# Patient Record
Sex: Female | Born: 1940
Health system: Southern US, Community
[De-identification: ages and names within clinical notes are randomized; demographics above are authoritative.]

## PROBLEM LIST (undated history)

## (undated) DIAGNOSIS — IMO0002 Reserved for concepts with insufficient information to code with codable children: Secondary | ICD-10-CM

## (undated) DIAGNOSIS — M810 Age-related osteoporosis without current pathological fracture: Secondary | ICD-10-CM

## (undated) DIAGNOSIS — G8929 Other chronic pain: Secondary | ICD-10-CM

## (undated) DIAGNOSIS — Z8041 Family history of malignant neoplasm of ovary: Secondary | ICD-10-CM

## (undated) DIAGNOSIS — M549 Dorsalgia, unspecified: Secondary | ICD-10-CM

## (undated) DIAGNOSIS — IMO0001 Reserved for inherently not codable concepts without codable children: Secondary | ICD-10-CM

## (undated) DIAGNOSIS — C50919 Malignant neoplasm of unspecified site of unspecified female breast: Secondary | ICD-10-CM

## (undated) DIAGNOSIS — M1990 Unspecified osteoarthritis, unspecified site: Secondary | ICD-10-CM

## (undated) HISTORY — DX: Family history of malignant neoplasm of ovary: Z80.41

## (undated) HISTORY — DX: Reserved for concepts with insufficient information to code with codable children: IMO0002

## (undated) HISTORY — PX: ABDOMINAL HYSTERECTOMY: SHX81

## (undated) HISTORY — DX: Malignant neoplasm of unspecified site of unspecified female breast: C50.919

## (undated) HISTORY — DX: Reserved for inherently not codable concepts without codable children: IMO0001

## (undated) HISTORY — DX: Age-related osteoporosis without current pathological fracture: M81.0

---

## 1965-05-26 HISTORY — PX: THYROID CYST EXCISION: SHX2511

## 1975-05-27 HISTORY — PX: APPENDECTOMY: SHX54

## 1983-05-27 HISTORY — PX: BREAST SURGERY: SHX581

## 1997-08-10 ENCOUNTER — Ambulatory Visit (HOSPITAL_COMMUNITY): Admission: RE | Admit: 1997-08-10 | Discharge: 1997-08-10 | Payer: Self-pay | Admitting: Internal Medicine

## 1997-10-31 ENCOUNTER — Ambulatory Visit (HOSPITAL_COMMUNITY): Admission: RE | Admit: 1997-10-31 | Discharge: 1997-10-31 | Payer: Self-pay | Admitting: Internal Medicine

## 1998-08-30 ENCOUNTER — Ambulatory Visit (HOSPITAL_COMMUNITY): Admission: RE | Admit: 1998-08-30 | Discharge: 1998-08-30 | Payer: Self-pay | Admitting: Internal Medicine

## 1998-08-30 ENCOUNTER — Encounter: Payer: Self-pay | Admitting: Internal Medicine

## 1999-09-02 ENCOUNTER — Ambulatory Visit (HOSPITAL_COMMUNITY): Admission: RE | Admit: 1999-09-02 | Discharge: 1999-09-02 | Payer: Self-pay | Admitting: Internal Medicine

## 1999-09-02 ENCOUNTER — Encounter: Payer: Self-pay | Admitting: Internal Medicine

## 2000-06-01 ENCOUNTER — Ambulatory Visit (HOSPITAL_COMMUNITY): Admission: RE | Admit: 2000-06-01 | Discharge: 2000-06-01 | Payer: Self-pay | Admitting: Internal Medicine

## 2000-06-01 ENCOUNTER — Encounter: Payer: Self-pay | Admitting: Internal Medicine

## 2000-09-08 ENCOUNTER — Ambulatory Visit (HOSPITAL_COMMUNITY): Admission: RE | Admit: 2000-09-08 | Discharge: 2000-09-08 | Payer: Self-pay | Admitting: Internal Medicine

## 2000-09-08 ENCOUNTER — Encounter: Payer: Self-pay | Admitting: Internal Medicine

## 2001-10-04 ENCOUNTER — Ambulatory Visit (HOSPITAL_COMMUNITY): Admission: RE | Admit: 2001-10-04 | Discharge: 2001-10-04 | Payer: Self-pay | Admitting: Internal Medicine

## 2001-10-04 ENCOUNTER — Encounter: Payer: Self-pay | Admitting: Internal Medicine

## 2002-09-08 ENCOUNTER — Encounter: Payer: Self-pay | Admitting: Internal Medicine

## 2002-09-08 ENCOUNTER — Ambulatory Visit (HOSPITAL_COMMUNITY): Admission: RE | Admit: 2002-09-08 | Discharge: 2002-09-08 | Payer: Self-pay | Admitting: Internal Medicine

## 2002-10-26 ENCOUNTER — Ambulatory Visit (HOSPITAL_COMMUNITY): Admission: RE | Admit: 2002-10-26 | Discharge: 2002-10-26 | Payer: Self-pay | Admitting: Internal Medicine

## 2002-10-26 ENCOUNTER — Encounter: Payer: Self-pay | Admitting: Internal Medicine

## 2003-11-02 ENCOUNTER — Ambulatory Visit (HOSPITAL_COMMUNITY): Admission: RE | Admit: 2003-11-02 | Discharge: 2003-11-02 | Payer: Self-pay | Admitting: Internal Medicine

## 2004-09-30 ENCOUNTER — Ambulatory Visit (HOSPITAL_COMMUNITY): Admission: RE | Admit: 2004-09-30 | Discharge: 2004-09-30 | Payer: Self-pay | Admitting: Internal Medicine

## 2004-10-29 ENCOUNTER — Ambulatory Visit (HOSPITAL_COMMUNITY): Admission: RE | Admit: 2004-10-29 | Discharge: 2004-10-29 | Payer: Self-pay | Admitting: Internal Medicine

## 2005-11-04 ENCOUNTER — Ambulatory Visit (HOSPITAL_COMMUNITY): Admission: RE | Admit: 2005-11-04 | Discharge: 2005-11-04 | Payer: Self-pay | Admitting: Internal Medicine

## 2006-11-18 ENCOUNTER — Ambulatory Visit (HOSPITAL_COMMUNITY): Admission: RE | Admit: 2006-11-18 | Discharge: 2006-11-18 | Payer: Self-pay | Admitting: Internal Medicine

## 2007-12-20 ENCOUNTER — Ambulatory Visit (HOSPITAL_COMMUNITY): Admission: RE | Admit: 2007-12-20 | Discharge: 2007-12-20 | Payer: Self-pay | Admitting: Internal Medicine

## 2007-12-28 ENCOUNTER — Encounter: Admission: RE | Admit: 2007-12-28 | Discharge: 2007-12-28 | Payer: Self-pay | Admitting: Internal Medicine

## 2008-05-04 ENCOUNTER — Ambulatory Visit (HOSPITAL_COMMUNITY): Admission: RE | Admit: 2008-05-04 | Discharge: 2008-05-04 | Payer: Self-pay | Admitting: Internal Medicine

## 2009-01-02 ENCOUNTER — Ambulatory Visit (HOSPITAL_COMMUNITY): Admission: RE | Admit: 2009-01-02 | Discharge: 2009-01-02 | Payer: Self-pay | Admitting: Internal Medicine

## 2010-06-17 ENCOUNTER — Encounter: Payer: Self-pay | Admitting: Internal Medicine

## 2011-07-07 ENCOUNTER — Ambulatory Visit (HOSPITAL_COMMUNITY)
Admission: RE | Admit: 2011-07-07 | Discharge: 2011-07-07 | Disposition: A | Discharge status: Home / Self Care | Payer: Medicare Other | Source: Ambulatory Visit | Attending: Internal Medicine | Admitting: Internal Medicine

## 2011-07-07 ENCOUNTER — Other Ambulatory Visit (HOSPITAL_COMMUNITY): Payer: Self-pay | Admitting: Internal Medicine

## 2011-07-07 DIAGNOSIS — M899 Disorder of bone, unspecified: Secondary | ICD-10-CM | POA: Insufficient documentation

## 2011-07-07 DIAGNOSIS — M545 Low back pain, unspecified: Secondary | ICD-10-CM

## 2011-07-07 DIAGNOSIS — M949 Disorder of cartilage, unspecified: Secondary | ICD-10-CM | POA: Diagnosis not present

## 2011-07-07 DIAGNOSIS — M439 Deforming dorsopathy, unspecified: Secondary | ICD-10-CM | POA: Insufficient documentation

## 2011-07-07 DIAGNOSIS — M47817 Spondylosis without myelopathy or radiculopathy, lumbosacral region: Secondary | ICD-10-CM | POA: Diagnosis not present

## 2011-07-28 ENCOUNTER — Other Ambulatory Visit: Payer: Self-pay | Admitting: Internal Medicine

## 2011-07-28 DIAGNOSIS — M545 Low back pain, unspecified: Secondary | ICD-10-CM

## 2011-07-29 ENCOUNTER — Ambulatory Visit
Admission: RE | Admit: 2011-07-29 | Discharge: 2011-07-29 | Disposition: A | Discharge status: Home / Self Care | Payer: Medicare Other | Source: Ambulatory Visit | Attending: Internal Medicine | Admitting: Internal Medicine

## 2011-07-29 DIAGNOSIS — M545 Low back pain, unspecified: Secondary | ICD-10-CM

## 2011-07-29 DIAGNOSIS — Z853 Personal history of malignant neoplasm of breast: Secondary | ICD-10-CM | POA: Diagnosis not present

## 2011-07-29 DIAGNOSIS — M5126 Other intervertebral disc displacement, lumbar region: Secondary | ICD-10-CM | POA: Diagnosis not present

## 2011-07-29 DIAGNOSIS — M8448XA Pathological fracture, other site, initial encounter for fracture: Secondary | ICD-10-CM | POA: Diagnosis not present

## 2011-08-08 DIAGNOSIS — S32009A Unspecified fracture of unspecified lumbar vertebra, initial encounter for closed fracture: Secondary | ICD-10-CM | POA: Diagnosis not present

## 2011-08-08 DIAGNOSIS — Z79899 Other long term (current) drug therapy: Secondary | ICD-10-CM | POA: Diagnosis not present

## 2011-08-08 DIAGNOSIS — M545 Low back pain, unspecified: Secondary | ICD-10-CM | POA: Diagnosis not present

## 2011-08-08 DIAGNOSIS — E559 Vitamin D deficiency, unspecified: Secondary | ICD-10-CM | POA: Diagnosis not present

## 2011-09-01 DIAGNOSIS — M810 Age-related osteoporosis without current pathological fracture: Secondary | ICD-10-CM | POA: Diagnosis not present

## 2011-09-01 DIAGNOSIS — M545 Low back pain, unspecified: Secondary | ICD-10-CM | POA: Diagnosis not present

## 2011-09-10 DIAGNOSIS — I10 Essential (primary) hypertension: Secondary | ICD-10-CM | POA: Diagnosis not present

## 2011-09-10 DIAGNOSIS — M899 Disorder of bone, unspecified: Secondary | ICD-10-CM | POA: Diagnosis not present

## 2011-09-10 DIAGNOSIS — M949 Disorder of cartilage, unspecified: Secondary | ICD-10-CM | POA: Diagnosis not present

## 2011-09-30 DIAGNOSIS — S32009A Unspecified fracture of unspecified lumbar vertebra, initial encounter for closed fracture: Secondary | ICD-10-CM | POA: Diagnosis not present

## 2011-10-08 ENCOUNTER — Other Ambulatory Visit: Payer: Self-pay | Admitting: Orthopaedic Surgery

## 2011-10-08 DIAGNOSIS — S32009A Unspecified fracture of unspecified lumbar vertebra, initial encounter for closed fracture: Secondary | ICD-10-CM | POA: Diagnosis not present

## 2011-10-08 DIAGNOSIS — S32000A Wedge compression fracture of unspecified lumbar vertebra, initial encounter for closed fracture: Secondary | ICD-10-CM

## 2011-10-09 ENCOUNTER — Ambulatory Visit
Admission: RE | Admit: 2011-10-09 | Discharge: 2011-10-09 | Disposition: A | Discharge status: Home / Self Care | Payer: Medicare Other | Source: Ambulatory Visit | Attending: Orthopaedic Surgery | Admitting: Orthopaedic Surgery

## 2011-10-09 DIAGNOSIS — M5126 Other intervertebral disc displacement, lumbar region: Secondary | ICD-10-CM | POA: Diagnosis not present

## 2011-10-09 DIAGNOSIS — M8448XA Pathological fracture, other site, initial encounter for fracture: Secondary | ICD-10-CM | POA: Diagnosis not present

## 2011-10-09 DIAGNOSIS — S32009A Unspecified fracture of unspecified lumbar vertebra, initial encounter for closed fracture: Secondary | ICD-10-CM | POA: Diagnosis not present

## 2011-10-09 DIAGNOSIS — S32000A Wedge compression fracture of unspecified lumbar vertebra, initial encounter for closed fracture: Secondary | ICD-10-CM

## 2011-10-28 DIAGNOSIS — S32009A Unspecified fracture of unspecified lumbar vertebra, initial encounter for closed fracture: Secondary | ICD-10-CM | POA: Diagnosis not present

## 2011-11-05 DIAGNOSIS — R072 Precordial pain: Secondary | ICD-10-CM | POA: Diagnosis not present

## 2011-11-25 DIAGNOSIS — S32009A Unspecified fracture of unspecified lumbar vertebra, initial encounter for closed fracture: Secondary | ICD-10-CM | POA: Diagnosis not present

## 2011-12-26 DIAGNOSIS — S32009A Unspecified fracture of unspecified lumbar vertebra, initial encounter for closed fracture: Secondary | ICD-10-CM | POA: Diagnosis not present

## 2012-01-27 DIAGNOSIS — R197 Diarrhea, unspecified: Secondary | ICD-10-CM | POA: Diagnosis not present

## 2012-01-27 DIAGNOSIS — R5381 Other malaise: Secondary | ICD-10-CM | POA: Diagnosis not present

## 2012-01-27 DIAGNOSIS — J029 Acute pharyngitis, unspecified: Secondary | ICD-10-CM | POA: Diagnosis not present

## 2012-01-30 DIAGNOSIS — S32009A Unspecified fracture of unspecified lumbar vertebra, initial encounter for closed fracture: Secondary | ICD-10-CM | POA: Diagnosis not present

## 2012-02-04 DIAGNOSIS — R5381 Other malaise: Secondary | ICD-10-CM | POA: Diagnosis not present

## 2012-02-04 DIAGNOSIS — R5383 Other fatigue: Secondary | ICD-10-CM | POA: Diagnosis not present

## 2012-02-04 DIAGNOSIS — R350 Frequency of micturition: Secondary | ICD-10-CM | POA: Diagnosis not present

## 2012-02-09 DIAGNOSIS — R197 Diarrhea, unspecified: Secondary | ICD-10-CM | POA: Diagnosis not present

## 2012-02-09 DIAGNOSIS — B370 Candidal stomatitis: Secondary | ICD-10-CM | POA: Diagnosis not present

## 2012-02-10 DIAGNOSIS — R197 Diarrhea, unspecified: Secondary | ICD-10-CM | POA: Diagnosis not present

## 2012-02-23 DIAGNOSIS — R634 Abnormal weight loss: Secondary | ICD-10-CM | POA: Diagnosis not present

## 2012-06-11 DIAGNOSIS — Z23 Encounter for immunization: Secondary | ICD-10-CM | POA: Diagnosis not present

## 2012-07-01 DIAGNOSIS — Z1212 Encounter for screening for malignant neoplasm of rectum: Secondary | ICD-10-CM | POA: Diagnosis not present

## 2012-07-01 DIAGNOSIS — Z79899 Other long term (current) drug therapy: Secondary | ICD-10-CM | POA: Diagnosis not present

## 2012-07-01 DIAGNOSIS — R7309 Other abnormal glucose: Secondary | ICD-10-CM | POA: Diagnosis not present

## 2012-07-01 DIAGNOSIS — I10 Essential (primary) hypertension: Secondary | ICD-10-CM | POA: Diagnosis not present

## 2012-07-01 DIAGNOSIS — E559 Vitamin D deficiency, unspecified: Secondary | ICD-10-CM | POA: Diagnosis not present

## 2012-07-01 DIAGNOSIS — E782 Mixed hyperlipidemia: Secondary | ICD-10-CM | POA: Diagnosis not present

## 2012-09-07 DIAGNOSIS — N8183 Incompetence or weakening of rectovaginal tissue: Secondary | ICD-10-CM | POA: Diagnosis not present

## 2012-10-04 DIAGNOSIS — R351 Nocturia: Secondary | ICD-10-CM | POA: Diagnosis not present

## 2013-01-06 DIAGNOSIS — I10 Essential (primary) hypertension: Secondary | ICD-10-CM | POA: Diagnosis not present

## 2013-01-06 DIAGNOSIS — E559 Vitamin D deficiency, unspecified: Secondary | ICD-10-CM | POA: Diagnosis not present

## 2013-01-06 DIAGNOSIS — E782 Mixed hyperlipidemia: Secondary | ICD-10-CM | POA: Diagnosis not present

## 2013-01-06 DIAGNOSIS — Z79899 Other long term (current) drug therapy: Secondary | ICD-10-CM | POA: Diagnosis not present

## 2013-02-07 DIAGNOSIS — Z23 Encounter for immunization: Secondary | ICD-10-CM | POA: Diagnosis not present

## 2013-02-07 DIAGNOSIS — J309 Allergic rhinitis, unspecified: Secondary | ICD-10-CM | POA: Diagnosis not present

## 2013-02-07 DIAGNOSIS — J019 Acute sinusitis, unspecified: Secondary | ICD-10-CM | POA: Diagnosis not present

## 2013-07-02 DIAGNOSIS — K589 Irritable bowel syndrome without diarrhea: Secondary | ICD-10-CM | POA: Insufficient documentation

## 2013-07-02 DIAGNOSIS — E785 Hyperlipidemia, unspecified: Secondary | ICD-10-CM | POA: Insufficient documentation

## 2013-07-02 DIAGNOSIS — E559 Vitamin D deficiency, unspecified: Secondary | ICD-10-CM | POA: Insufficient documentation

## 2013-07-02 DIAGNOSIS — R7309 Other abnormal glucose: Secondary | ICD-10-CM | POA: Insufficient documentation

## 2013-07-02 DIAGNOSIS — R0989 Other specified symptoms and signs involving the circulatory and respiratory systems: Secondary | ICD-10-CM | POA: Insufficient documentation

## 2013-07-02 DIAGNOSIS — M810 Age-related osteoporosis without current pathological fracture: Secondary | ICD-10-CM | POA: Insufficient documentation

## 2013-07-07 ENCOUNTER — Ambulatory Visit (INDEPENDENT_AMBULATORY_CARE_PROVIDER_SITE_OTHER): Payer: Medicare Other | Admitting: Internal Medicine

## 2013-07-07 ENCOUNTER — Encounter: Payer: Self-pay | Admitting: Internal Medicine

## 2013-07-07 VITALS — BP 126/80 | HR 88 | Temp 97.9°F | Resp 16 | Ht 62.5 in | Wt 122.2 lb

## 2013-07-07 DIAGNOSIS — E559 Vitamin D deficiency, unspecified: Secondary | ICD-10-CM

## 2013-07-07 DIAGNOSIS — Z79899 Other long term (current) drug therapy: Secondary | ICD-10-CM | POA: Diagnosis not present

## 2013-07-07 DIAGNOSIS — Z1212 Encounter for screening for malignant neoplasm of rectum: Secondary | ICD-10-CM | POA: Diagnosis not present

## 2013-07-07 DIAGNOSIS — Z0000 Encounter for general adult medical examination without abnormal findings: Secondary | ICD-10-CM

## 2013-07-07 DIAGNOSIS — R7309 Other abnormal glucose: Secondary | ICD-10-CM | POA: Diagnosis not present

## 2013-07-07 DIAGNOSIS — I10 Essential (primary) hypertension: Secondary | ICD-10-CM | POA: Diagnosis not present

## 2013-07-07 DIAGNOSIS — Z7189 Other specified counseling: Secondary | ICD-10-CM | POA: Insufficient documentation

## 2013-07-07 DIAGNOSIS — R0989 Other specified symptoms and signs involving the circulatory and respiratory systems: Secondary | ICD-10-CM

## 2013-07-07 DIAGNOSIS — R7303 Prediabetes: Secondary | ICD-10-CM

## 2013-07-07 DIAGNOSIS — E785 Hyperlipidemia, unspecified: Secondary | ICD-10-CM

## 2013-07-07 LAB — CBC WITH DIFFERENTIAL/PLATELET
BASOS ABS: 0 10*3/uL (ref 0.0–0.1)
Basophils Relative: 1 % (ref 0–1)
Eosinophils Absolute: 0.2 10*3/uL (ref 0.0–0.7)
Eosinophils Relative: 4 % (ref 0–5)
HEMATOCRIT: 43 % (ref 36.0–46.0)
HEMOGLOBIN: 14.8 g/dL (ref 12.0–15.0)
LYMPHS ABS: 1.2 10*3/uL (ref 0.7–4.0)
LYMPHS PCT: 22 % (ref 12–46)
MCH: 31.2 pg (ref 26.0–34.0)
MCHC: 34.4 g/dL (ref 30.0–36.0)
MCV: 90.7 fL (ref 78.0–100.0)
MONOS PCT: 8 % (ref 3–12)
Monocytes Absolute: 0.4 10*3/uL (ref 0.1–1.0)
NEUTROS ABS: 3.6 10*3/uL (ref 1.7–7.7)
Neutrophils Relative %: 65 % (ref 43–77)
Platelets: 258 10*3/uL (ref 150–400)
RBC: 4.74 MIL/uL (ref 3.87–5.11)
RDW: 14.4 % (ref 11.5–15.5)
WBC: 5.5 10*3/uL (ref 4.0–10.5)

## 2013-07-07 NOTE — Patient Instructions (Signed)

## 2013-07-07 NOTE — Progress Notes (Signed)
Patient ID: April Rosales, female   DOB: 1940-09-10, 73 y.o.   MRN: [id number]  Annual Screening Comprehensive Examination  This very nice 73 y.o. SWF presents for complete physical.  Patient has been followed for hx/o labile HTN, Prediabetes, Hyperlipidemia, and Vitamin D Deficiency.    Patient has remote hx/o a elevated BP in 2009 and has been monitored expectantly. Patient's BP has been controlled at home. Today's BP: 126/80 mmHg. Patient denies any cardiac symptoms as chest pain, palpitations, shortness of breath, dizziness or ankle swelling.   Patient's hyperlipidemia is controlled with diet. Patient denies myalgias or other medication SE's. Last cholesterol last visit was 187, triglycerides 93, HDL 56 and slightly elevated LDL 112 in Aug 2014.     Patient has prediabetes/insulin resistance 5.4%/32 in Oct 2012 and with last A1c 5.5% in Feb 2014. Patient denies reactive hypoglycemic symptoms, visual blurring, diabetic polys, or paresthesias.    Patient has remote Hx/o Rt Breast cancer (1985) removed by lumpectomy and had radiation therapy at [facility] in [place](but no chemotx). She declines to have a MGM stating she would not have any treatments of any kind whatsoever.   Finally, patient has history of Vitamin D Deficiency 28 in 2008  with last vitamin D 29 in Feb 2014.       Medication List         aspirin 81 MG tablet  Take 81 mg by mouth daily.     GERITOL PO  Take by mouth daily.     methocarbamol 500 MG tablet  Commonly known as:  ROBAXIN     TUMS 500 MG chewable tablet  Generic drug:  calcium carbonate  Chew 1 tablet by mouth as needed for indigestion or heartburn.     VITAMIN B-12 PO  Take by mouth daily. Takes qod     VITAMIN C PO  Take by mouth daily. Takes qod     VITAMIN D PO  Take 2,000 Int'l Units by mouth daily.        No Known Allergies  Past Medical History  Diagnosis Date  . Labile hypertension   . Hyperlipidemia   . Prediabetes   . Vitamin D  deficiency   . Osteoporosis   . IBS (irritable bowel syndrome)   . History of breast cancer     Past Surgical History  Procedure Laterality Date  . Appendectomy  1977  . Abdominal hysterectomy      1980  . Thyroid cyst excision  1967  . Breast surgery Right 1985    Family History  Problem Relation Age of Onset  . Heart disease Mother   . Hypertension Mother   . Heart disease Father   . Diabetes Father     History  Substance Use Topics  . Smoking status: Former Research scientist (life sciences)  . Smokeless tobacco: Former Systems developer    Quit date: 07/08/1963  . Alcohol Use: Yes     Comment: Rare    ROS Constitutional: Denies fever, chills, weight loss/gain, headaches, insomnia, fatigue, night sweats, and change in appetite. Eyes: Denies redness, blurred vision, diplopia, discharge, itchy, watery eyes.  ENT: Denies discharge, congestion, post nasal drip, epistaxis, sore throat, earache, hearing loss, dental pain, Tinnitus, Vertigo, Sinus pain, snoring.  Cardio: Denies chest pain, palpitations, irregular heartbeat, syncope, dyspnea, diaphoresis, orthopnea, PND, claudication, edema Respiratory: denies cough, dyspnea, DOE, pleurisy, hoarseness, laryngitis, wheezing.  Gastrointestinal: Denies dysphagia, heartburn, reflux, water brash, pain, cramps, nausea, vomiting, bloating, diarrhea, constipation, hematemesis, melena, hematochezia, jaundice, hemorrhoids Genitourinary: Denies dysuria,  frequency, urgency, nocturia, hesitancy, discharge, hematuria, flank pain Breast:Breast lumps, nipple discharge, bleeding.  Musculoskeletal: Denies arthralgia, myalgia, stiffness, Jt. Swelling, pain, limp, and strain/sprain. Skin: Denies puritis, rash, hives, warts, acne, eczema, changing in skin lesion Neuro: No weakness, tremor, incoordination, spasms, paresthesia, pain Psychiatric: Denies confusion, memory loss, sensory loss Endocrine: Denies change in weight, skin, hair change, nocturia, and paresthesia, diabetic polys,  visual blurring, hyper / hypo glycemic episodes.  Heme/Lymph: No excessive bleeding, bruising, enlarged lymph nodes.  BP: 126/80  Pulse: 88  Temp: 97.9 F (36.6 C)  Resp: 16    Estimated body mass index is 21.98 kg/(m^2) as calculated from the following:   Height as of this encounter: 5' 2.5" (1.588 m).   Weight as of this encounter: 122 lb 3.2 oz (55.43 kg).  Physical Exam General Appearance: Well nourished, in no apparent distress. Eyes: PERRLA, EOMs, conjunctiva no swelling or erythema, normal fundi and vessels. Sinuses: No frontal/maxillary tenderness ENT/Mouth: EACs patent / TMs  nl. Nares clear without erythema, swelling, mucoid exudates. Oral hygiene is good. No erythema, swelling, or exudate. Tongue normal, non-obstructing. Tonsils not swollen or erythematous. Hearing normal.  Neck: Supple, thyroid normal. No bruits, nodes or JVD. Respiratory: Respiratory effort normal.  BS equal and clear bilateral without rales, rhonci, wheezing or stridor. Cardio: Heart sounds are normal with regular rate and rhythm and no murmurs, rubs or gallops. Peripheral pulses are normal and equal bilaterally without edema. No aortic or femoral bruits. Chest: symmetric with normal excursions and percussion. Mild kyphoscoliosis. Breasts: Symmetric, without lumps, nipple discharge, retractions, or fibrocystic changes.  Abdomen: Flat, soft, with bowl sounds. Nontender, no guarding, rebound, hernias, masses, or organomegaly.  Lymphatics: Non tender without lymphadenopathy.  Genitourinary:  Musculoskeletal: Full ROM all peripheral extremities, joint stability, 5/5 strength, and normal gait. Skin: Warm and dry without rashes, lesions, cyanosis, clubbing or  ecchymosis.  Neuro: Cranial nerves intact, reflexes equal bilaterally. Normal muscle tone, no cerebellar symptoms. Sensation intact.  Pysch: Awake and oriented X 3, normal affect, Insight and Judgment appropriate.   Assessment and Plan  1. Annual  Screening Examination 2. Hypertension, Labile - monitoring expectantly 3. Hyperlipidemia, diet controlled 4. Pre Diabetes, screening - monitoring expectantly 5. Vitamin D Deficiency - supplementing 6. Breast Cancer (1985)  Continue prudent diet as discussed, weight control, BP monitoring, regular exercise, and medications. Discussed med's effects and SE's. Screening labs and tests as requested with regular follow-up as recommended. Patient was advised annual MGM and she informed me and the nurse Ms. Evans that she will not have a MGM , because she would not do nything if ahe were found to have another breast cancer.

## 2013-07-08 LAB — URINALYSIS, MICROSCOPIC ONLY
BACTERIA UA: NONE SEEN
CASTS: NONE SEEN
CRYSTALS: NONE SEEN
Squamous Epithelial / LPF: NONE SEEN

## 2013-07-08 LAB — BASIC METABOLIC PANEL WITH GFR
BUN: 16 mg/dL (ref 6–23)
CALCIUM: 10.1 mg/dL (ref 8.4–10.5)
CHLORIDE: 103 meq/L (ref 96–112)
CO2: 28 mEq/L (ref 19–32)
Creat: 0.57 mg/dL (ref 0.50–1.10)
GFR, Est Non African American: >89 mL/min
GLUCOSE: 87 mg/dL (ref 70–99)
POTASSIUM: 5.3 meq/L (ref 3.5–5.3)
SODIUM: 140 meq/L (ref 135–145)

## 2013-07-08 LAB — HEPATIC FUNCTION PANEL
ALBUMIN: 4 g/dL (ref 3.5–5.2)
ALT: 15 U/L (ref 0–35)
AST: 19 U/L (ref 0–37)
Alkaline Phosphatase: 70 U/L (ref 39–117)
Bilirubin, Direct: 0.1 mg/dL (ref 0.0–0.3)
Indirect Bilirubin: 0.3 mg/dL (ref 0.2–1.2)
Total Bilirubin: 0.4 mg/dL (ref 0.2–1.2)
Total Protein: 6.9 g/dL (ref 6.0–8.3)

## 2013-07-08 LAB — MICROALBUMIN / CREATININE URINE RATIO
Creatinine, Urine: 37.3 mg/dL
MICROALB/CREAT RATIO: 13.4 mg/g (ref 0.0–30.0)
Microalb, Ur: 0.5 mg/dL (ref 0.00–1.89)

## 2013-07-08 LAB — INSULIN, FASTING: Insulin fasting, serum: 10 u[IU]/mL (ref 3–28)

## 2013-07-08 LAB — LIPID PANEL
CHOL/HDL RATIO: 3.4 ratio
CHOLESTEROL: 169 mg/dL (ref 0–200)
HDL: 49 mg/dL (ref >39)
LDL Cholesterol: 98 mg/dL (ref 0–99)
Triglycerides: 112 mg/dL (ref <150)
VLDL: 22 mg/dL (ref 0–40)

## 2013-07-08 LAB — VITAMIN D 25 HYDROXY (VIT D DEFICIENCY, FRACTURES): Vit D, 25-Hydroxy: 54 ng/mL (ref 30–89)

## 2013-07-08 LAB — TSH: TSH: 1.122 u[IU]/mL (ref 0.350–4.500)

## 2013-07-08 LAB — MAGNESIUM: Magnesium: 1.9 mg/dL (ref 1.5–2.5)

## 2013-07-08 LAB — HEMOGLOBIN A1C
Hgb A1c MFr Bld: 5.5 % (ref <5.7)
Mean Plasma Glucose: 111 mg/dL (ref <117)

## 2014-01-10 ENCOUNTER — Ambulatory Visit: Payer: Medicare Other | Admitting: Internal Medicine

## 2014-06-02 ENCOUNTER — Encounter: Payer: Self-pay | Admitting: Internal Medicine

## 2014-06-02 ENCOUNTER — Ambulatory Visit (INDEPENDENT_AMBULATORY_CARE_PROVIDER_SITE_OTHER): Payer: Medicare Other | Admitting: Internal Medicine

## 2014-06-02 VITALS — BP 122/80 | HR 104 | Temp 99.0°F | Resp 16 | Ht 62.5 in | Wt 120.4 lb

## 2014-06-02 DIAGNOSIS — J0410 Acute tracheitis without obstruction: Secondary | ICD-10-CM

## 2014-06-02 DIAGNOSIS — J029 Acute pharyngitis, unspecified: Secondary | ICD-10-CM

## 2014-06-02 DIAGNOSIS — J0100 Acute maxillary sinusitis, unspecified: Secondary | ICD-10-CM

## 2014-06-02 MED ORDER — PREDNISONE 20 MG PO TABS: ORAL_TABLET | ORAL | Status: DC

## 2014-06-02 MED ORDER — HYDROCODONE-ACETAMINOPHEN 5-325 MG PO TABS: ORAL_TABLET | ORAL | Status: DC

## 2014-06-02 MED ORDER — AZITHROMYCIN 250 MG PO TABS: ORAL_TABLET | ORAL | Status: DC

## 2014-06-02 NOTE — Progress Notes (Deleted)
   Subjective:    Patient ID: April Rosales, female    DOB: 04-02-1941, 74 y.o.   MRN: [id number]  Cough This is a new problem.  Sinusitis Associated symptoms include coughing.      Review of Systems  Respiratory: Positive for cough.        Objective:   Physical Exam        Assessment & Plan:

## 2014-06-02 NOTE — Progress Notes (Signed)
   Subjective:    Patient ID: April Rosales, female    DOB: 1941/02/15, 74 y.o.   MRN: [id number]  HPI  c/o head & chest congestion and productive cough x 3-5 days. (+) sinus pressure & drainage - yellowish green. Medication Sig  . Ascorbic Acid (VITAMIN C PO) Take by mouth daily. Takes qod  . aspirin 81 MG tablet Take 81 mg by mouth daily.  . calcium carbonate (TUMS) 500 MG chewable tablet Chew 1 tablet by mouth as needed for indigestion or heartburn.  . Cyanocobalamin (VITAMIN B-12 PO) Take by mouth daily. Takes qod  . Iron-Vitamins (GERITOL PO) Take by mouth daily.  . Cholecalciferol (VITAMIN D PO) Take 2,000 Int'l Units by mouth daily.   . methocarbamol (ROBAXIN) 500 MG tablet     No Known Allergies  Past Medical History  Diagnosis Date  . Labile hypertension   . Hyperlipidemia   . Prediabetes   . Vitamin D deficiency   . Osteoporosis   . IBS (irritable bowel syndrome)   . History of breast cancer    Review of Systems   Non contributory to above.    Objective:   Physical Exam BP 122/80   Pulse 104  Temp 99 F   Resp 16  Ht 5' 2.5"   Wt 120 lb 6.4 oz   BMI 21.66   In No Distress. Dry barking cough.   HEENT - Eac's patent. TM's Nl. EOM's full. PERRLA. Sl max tenderness.  NasoOroPharynx clear. Neck - supple. Nl Thyroid. Carotids 2+ & No bruits, nodes, JVD Chest -  Distant BS with few scattered dry rales. Cor - Nl HS. RRR w/o sig MGR. PP 1(+). No edema. Abd - No palpable organomegaly, masses or tenderness. BS nl. MS- FROM w/o deformities. Muscle power, tone and bulk Nl. Gait Nl. Neuro - No obvious Cr N abnormalities. Sensory, motor and Cerebellar functions appear Nl w/o focal abnormalities. Psyche - Mental status normal & appropriate.      Assessment & Plan:   1. Tracheitis   2. Acute maxillary sinusitis, recurrence not specified   3. Acute pharyngitis, unspecified pharyngitis type   - Rx Zpak x 1 rf - Rx - Prednisone pulse/taper - Norco - prn cough. -  ROV - prn

## 2014-07-10 ENCOUNTER — Encounter: Payer: Self-pay | Admitting: Internal Medicine

## 2014-08-04 ENCOUNTER — Other Ambulatory Visit: Payer: Self-pay | Admitting: Orthopaedic Surgery

## 2014-08-04 DIAGNOSIS — S22040A Wedge compression fracture of fourth thoracic vertebra, initial encounter for closed fracture: Secondary | ICD-10-CM | POA: Diagnosis not present

## 2014-08-04 DIAGNOSIS — S22070A Wedge compression fracture of T9-T10 vertebra, initial encounter for closed fracture: Secondary | ICD-10-CM | POA: Diagnosis not present

## 2014-08-04 DIAGNOSIS — M546 Pain in thoracic spine: Secondary | ICD-10-CM

## 2014-08-04 DIAGNOSIS — S22030A Wedge compression fracture of third thoracic vertebra, initial encounter for closed fracture: Secondary | ICD-10-CM | POA: Diagnosis not present

## 2014-08-14 DIAGNOSIS — Z780 Asymptomatic menopausal state: Secondary | ICD-10-CM | POA: Diagnosis not present

## 2014-08-16 ENCOUNTER — Ambulatory Visit
Admission: RE | Admit: 2014-08-16 | Discharge: 2014-08-16 | Disposition: A | Discharge status: Home / Self Care | Payer: Medicare Other | Source: Ambulatory Visit | Attending: Orthopaedic Surgery | Admitting: Orthopaedic Surgery

## 2014-08-16 DIAGNOSIS — Z853 Personal history of malignant neoplasm of breast: Secondary | ICD-10-CM | POA: Diagnosis not present

## 2014-08-16 DIAGNOSIS — M5124 Other intervertebral disc displacement, thoracic region: Secondary | ICD-10-CM | POA: Diagnosis not present

## 2014-08-16 DIAGNOSIS — M4854XA Collapsed vertebra, not elsewhere classified, thoracic region, initial encounter for fracture: Secondary | ICD-10-CM | POA: Diagnosis not present

## 2014-08-16 DIAGNOSIS — M546 Pain in thoracic spine: Secondary | ICD-10-CM

## 2014-08-16 DIAGNOSIS — M8458XA Pathological fracture in neoplastic disease, other specified site, initial encounter for fracture: Secondary | ICD-10-CM | POA: Diagnosis not present

## 2014-08-21 ENCOUNTER — Encounter: Payer: Self-pay | Admitting: Internal Medicine

## 2014-08-21 ENCOUNTER — Ambulatory Visit (INDEPENDENT_AMBULATORY_CARE_PROVIDER_SITE_OTHER): Payer: Medicare Other | Admitting: Internal Medicine

## 2014-08-21 VITALS — BP 122/78 | HR 108 | Temp 97.5°F | Resp 16 | Ht 62.5 in | Wt 122.4 lb

## 2014-08-21 DIAGNOSIS — C7951 Secondary malignant neoplasm of bone: Secondary | ICD-10-CM

## 2014-08-21 LAB — CBC WITH DIFFERENTIAL/PLATELET
Basophils Absolute: 0.1 10*3/uL (ref 0.0–0.1)
Basophils Relative: 1 % (ref 0–1)
EOS ABS: 0.2 10*3/uL (ref 0.0–0.7)
Eosinophils Relative: 4 % (ref 0–5)
HEMATOCRIT: 42.2 % (ref 36.0–46.0)
Hemoglobin: 14.5 g/dL (ref 12.0–15.0)
Lymphocytes Relative: 27 % (ref 12–46)
Lymphs Abs: 1.5 10*3/uL (ref 0.7–4.0)
MCH: 31.3 pg (ref 26.0–34.0)
MCHC: 34.4 g/dL (ref 30.0–36.0)
MCV: 91.1 fL (ref 78.0–100.0)
MPV: 9.1 fL (ref 8.6–12.4)
Monocytes Absolute: 0.4 10*3/uL (ref 0.1–1.0)
Monocytes Relative: 7 % (ref 3–12)
NEUTROS ABS: 3.3 10*3/uL (ref 1.7–7.7)
Neutrophils Relative %: 61 % (ref 43–77)
Platelets: 285 10*3/uL (ref 150–400)
RBC: 4.63 MIL/uL (ref 3.87–5.11)
RDW: 15 % (ref 11.5–15.5)
WBC: 5.4 10*3/uL (ref 4.0–10.5)

## 2014-08-21 NOTE — Patient Instructions (Signed)
Plan for Diagnostic Mammogram  CT scans of Chest - Abdomen/ Pelvis

## 2014-08-21 NOTE — Progress Notes (Deleted)
Patient ID: April Rosales, female   DOB: 1940-11-19, 74 y.o.   MRN: [id number]

## 2014-08-21 NOTE — Progress Notes (Signed)
   Subjective:    Patient ID: April Rosales, female    DOB: [birth year], 74 y.o.   MRN: [id number]  HPI  Patient sent by Dr [name] after recent Thoracic MRI found a suspected Met to T3 Vertebrae with an epidural tumor displacing the Sp cord slightly w/o compression. Patient had recent labs ~ 1 month ago which were unremarkable. She has a remote hx/o breast cancer 30 yr ago treated by lumpectomy and radiation/tamoxifen. Currently her main c/o is pain in her left shoulder blade. She's not had a MGM in over 5 years.  She's always refused colonoscopy.   Medication Sig  . Ascorbic Acid (VITAMIN C PO) Take by mouth daily. Takes qod  . aspirin 81 MG tablet Take 81 mg by mouth daily.  . calcium carbonate (TUMS) 500 MG chewable tablet Chew 1 tablet by mouth as needed for indigestion or heartburn.  . Cyanocobalamin (VITAMIN B-12 PO) Take by mouth daily. Takes qod  . Iron-Vitamins (GERITOL PO) Take by mouth daily.   No Known Allergies   Past Medical History  Diagnosis Date  . Labile hypertension   . Hyperlipidemia   . Prediabetes   . Vitamin D deficiency   . Osteoporosis   . IBS (irritable bowel syndrome)   . History of breast cancer    Past Surgical History  Procedure Laterality Date  . Appendectomy  1977  . Abdominal hysterectomy & oophorectomy      1980  . Thyroid cyst excision  1967  . Breast surgery Right 1985   Review of Systems 10 point systems review negative except as above.    Objective:   Physical Exam  BP 122/78   P 108  T 97.5 F   R 16  Ht 5' 2.5"   Wt 122 lb 6.4 oz     BMI 22.02   HEENT - Eac's patent. TM's Nl. EOM's full. PERRLA. NasoOroPharynx clear. Neck - supple. Nl Thyroid. Carotids 2+ & No bruits, nodes, JVD Chest - Clear equal BS w/o Rales, rhonchi, wheezes. Cor - Nl HS. RRR w/o sig MGR. PP 1(+). No edema. Abd - No palpable organomegaly, masses or tenderness. BS nl. MS- FROM w/o deformities. Muscle power, tone and bulk Nl. Gait Nl. Tender along medial left  scapula. Neuro - No obvious Cr N abnormalities. Sensory, motor and Cerebellar functions appear Nl w/o focal abnormalities. Psyche - Mental status normal & appropriate.  No delusions, ideations or obvious mood abnormalities.    Assessment & Plan:   1. 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  - CT Chest W Contrast; Future - CT Abdomen Pelvis W Contrast; Future  - MM Digital Diagnostic Bilat; Future  +++++++++++++++++++++++++++++++++  - Findings & plan discussed with patient , her sister & neighbor  - Request Neurosurg consult for Epidural Tumor

## 2014-08-22 ENCOUNTER — Other Ambulatory Visit: Payer: Self-pay | Admitting: Internal Medicine

## 2014-08-22 DIAGNOSIS — C7949 Secondary malignant neoplasm of other parts of nervous system: Secondary | ICD-10-CM

## 2014-08-22 DIAGNOSIS — C7951 Secondary malignant neoplasm of bone: Secondary | ICD-10-CM | POA: Insufficient documentation

## 2014-08-22 LAB — HEPATIC FUNCTION PANEL
ALT: 13 U/L (ref 0–35)
AST: 18 U/L (ref 0–37)
Albumin: 4.2 g/dL (ref 3.5–5.2)
Alkaline Phosphatase: 53 U/L (ref 39–117)
Bilirubin, Direct: 0.1 mg/dL (ref 0.0–0.3)
Indirect Bilirubin: 0.3 mg/dL (ref 0.2–1.2)
TOTAL PROTEIN: 6.7 g/dL (ref 6.0–8.3)
Total Bilirubin: 0.4 mg/dL (ref 0.2–1.2)

## 2014-08-22 LAB — BASIC METABOLIC PANEL WITH GFR
BUN: 13 mg/dL (ref 6–23)
CALCIUM: 9.9 mg/dL (ref 8.4–10.5)
CO2: 28 mEq/L (ref 19–32)
Chloride: 102 mEq/L (ref 96–112)
Creat: 0.59 mg/dL (ref 0.50–1.10)
Glucose, Bld: 86 mg/dL (ref 70–99)
Potassium: 4.4 mEq/L (ref 3.5–5.3)
SODIUM: 141 meq/L (ref 135–145)

## 2014-08-22 LAB — URINALYSIS, MICROSCOPIC ONLY
Bacteria, UA: NONE SEEN
CASTS: NONE SEEN
Crystals: NONE SEEN
Squamous Epithelial / LPF: NONE SEEN

## 2014-08-23 DIAGNOSIS — M546 Pain in thoracic spine: Secondary | ICD-10-CM | POA: Diagnosis not present

## 2014-08-23 DIAGNOSIS — R030 Elevated blood-pressure reading, without diagnosis of hypertension: Secondary | ICD-10-CM | POA: Diagnosis not present

## 2014-08-23 LAB — PROTEIN ELECTROPHORESIS, URINE REFLEX: Total Protein, Urine: <4 mg/dL

## 2014-08-23 LAB — PROTEIN ELECTROPHORESIS, SERUM, WITH REFLEX
Albumin ELP: 4.1 g/dL (ref 3.8–4.8)
Alpha-1-Globulin: 0.3 g/dL (ref 0.2–0.3)
Alpha-2-Globulin: 0.7 g/dL (ref 0.5–0.9)
BETA 2: 0.4 g/dL (ref 0.2–0.5)
Beta Globulin: 0.5 g/dL (ref 0.4–0.6)
GAMMA GLOBULIN: 0.9 g/dL (ref 0.8–1.7)
TOTAL PROTEIN, SERUM ELECTROPHOR: 6.9 g/dL (ref 6.1–8.1)

## 2014-08-24 ENCOUNTER — Ambulatory Visit
Admission: RE | Admit: 2014-08-24 | Discharge: 2014-08-24 | Disposition: A | Discharge status: Home / Self Care | Payer: Medicare Other | Source: Ambulatory Visit | Attending: Internal Medicine | Admitting: Internal Medicine

## 2014-08-24 ENCOUNTER — Other Ambulatory Visit: Payer: Self-pay | Admitting: Internal Medicine

## 2014-08-24 DIAGNOSIS — R1900 Intra-abdominal and pelvic swelling, mass and lump, unspecified site: Secondary | ICD-10-CM | POA: Diagnosis not present

## 2014-08-24 DIAGNOSIS — C7951 Secondary malignant neoplasm of bone: Secondary | ICD-10-CM

## 2014-08-24 DIAGNOSIS — C799 Secondary malignant neoplasm of unspecified site: Secondary | ICD-10-CM

## 2014-08-24 DIAGNOSIS — R0789 Other chest pain: Secondary | ICD-10-CM | POA: Diagnosis not present

## 2014-08-24 MED ORDER — IOPAMIDOL (ISOVUE-300) INJECTION 61%
100.0000 mL | Freq: Once | INTRAVENOUS | Status: AC | PRN
  Administered 2014-08-24: 100 mL via INTRAVENOUS

## 2014-08-25 ENCOUNTER — Telehealth: Payer: Self-pay | Admitting: *Deleted

## 2014-08-25 ENCOUNTER — Other Ambulatory Visit: Payer: Self-pay | Admitting: Internal Medicine

## 2014-08-25 DIAGNOSIS — R030 Elevated blood-pressure reading, without diagnosis of hypertension: Secondary | ICD-10-CM | POA: Diagnosis not present

## 2014-08-25 DIAGNOSIS — C50912 Malignant neoplasm of unspecified site of left female breast: Secondary | ICD-10-CM

## 2014-08-25 DIAGNOSIS — M546 Pain in thoracic spine: Secondary | ICD-10-CM | POA: Diagnosis not present

## 2014-08-25 NOTE — Telephone Encounter (Signed)
Received referral from Dr. Idell Pickles office for an urgent med onc appt w/ request to call Katrina at the office w/ appt.  Called Katrina as requested and gave her 08/31/14 appt w/ directions and instructions.  She will give pt appt info w/ my number to call w/ any questions.

## 2014-08-28 ENCOUNTER — Ambulatory Visit (INDEPENDENT_AMBULATORY_CARE_PROVIDER_SITE_OTHER): Payer: Medicare Other | Admitting: Internal Medicine

## 2014-08-28 ENCOUNTER — Encounter: Payer: Self-pay | Admitting: Internal Medicine

## 2014-08-28 VITALS — BP 136/90 | HR 104 | Temp 97.5°F | Resp 16 | Ht 62.0 in | Wt 120.4 lb

## 2014-08-28 DIAGNOSIS — R7309 Other abnormal glucose: Secondary | ICD-10-CM | POA: Diagnosis not present

## 2014-08-28 DIAGNOSIS — E785 Hyperlipidemia, unspecified: Secondary | ICD-10-CM | POA: Diagnosis not present

## 2014-08-28 DIAGNOSIS — I10 Essential (primary) hypertension: Secondary | ICD-10-CM | POA: Diagnosis not present

## 2014-08-28 DIAGNOSIS — R0989 Other specified symptoms and signs involving the circulatory and respiratory systems: Secondary | ICD-10-CM

## 2014-08-28 DIAGNOSIS — Z79899 Other long term (current) drug therapy: Secondary | ICD-10-CM

## 2014-08-28 DIAGNOSIS — R7303 Prediabetes: Secondary | ICD-10-CM

## 2014-08-28 DIAGNOSIS — E559 Vitamin D deficiency, unspecified: Secondary | ICD-10-CM | POA: Diagnosis not present

## 2014-08-28 DIAGNOSIS — Z1331 Encounter for screening for depression: Secondary | ICD-10-CM

## 2014-08-28 DIAGNOSIS — Z1212 Encounter for screening for malignant neoplasm of rectum: Secondary | ICD-10-CM

## 2014-08-28 DIAGNOSIS — M810 Age-related osteoporosis without current pathological fracture: Secondary | ICD-10-CM

## 2014-08-28 DIAGNOSIS — C7951 Secondary malignant neoplasm of bone: Secondary | ICD-10-CM

## 2014-08-28 DIAGNOSIS — C50919 Malignant neoplasm of unspecified site of unspecified female breast: Secondary | ICD-10-CM | POA: Insufficient documentation

## 2014-08-28 LAB — MAGNESIUM: Magnesium: 1.9 mg/dL (ref 1.5–2.5)

## 2014-08-28 LAB — LIPID PANEL
CHOL/HDL RATIO: 3.6 ratio
Cholesterol: 175 mg/dL (ref 0–200)
HDL: 49 mg/dL (ref >=46)
LDL Cholesterol: 100 mg/dL — ABNORMAL HIGH (ref 0–99)
Triglycerides: 128 mg/dL (ref <150)
VLDL: 26 mg/dL (ref 0–40)

## 2014-08-28 LAB — HEMOGLOBIN A1C
Hgb A1c MFr Bld: 5.4 % (ref <5.7)
Mean Plasma Glucose: 108 mg/dL (ref <117)

## 2014-08-28 LAB — TSH: TSH: 1.288 u[IU]/mL (ref 0.350–4.500)

## 2014-08-28 NOTE — Patient Instructions (Signed)

## 2014-08-28 NOTE — Progress Notes (Signed)
Patient ID: April Rosales, female   DOB: January 03, 1941, 74 y.o.   MRN: [id number]  Annual Comprehensive Examination   This very nice 74 y.o. DWF presents for complete physical.  Patient has been followed for labile HTN, Prediabetes, Hyperlipidemia, and Vitamin D Deficiency.   This patient had art lumpectomy for Breast Ca in 1986 and drove daily to Radiation Therapy @ [facility] in [place] as she did not wish anyone in [place] to know that she was being treated for Breast Cancer. Now she presents about 1 week ago with a Thoracic Met encroaching on the Spinal Cord and has been evaluated by Dr [name]. Metastatic w/u found a mass by Chest CT scan  in the Left Breast and patient is scheduled for Bx in the morning and patient is also scheduled to see Dr Jana Hakim in 3 days. Patient admits that she does not do self Breast Exams and has not gotten a MGM in 5+ years. Patient does relate she is somewhat "nervous" over the upcoming anticipated treatments.    Patient has labile HTN monitored expectantly predating since 2009. Patient's BP has been controlled at home and patient denies any cardiac symptoms as chest pain, palpitations, shortness of breath, dizziness or ankle swelling. Today's BP: 136/90 mmHg    Patient's hyperlipidemia is controlled with diet and medications. Patient denies myalgias or other medication SE's. Last lipids were TC 175, HDL 49, LDL 100 and TG 128 - at goal.   Patient has prediabetes with insulin resistance (Fasting Insulin 40 in the past)  and she denies reactive hypoglycemic symptoms, visual blurring, diabetic polys, or paresthesias. Last A1c was 5.5% in Feb 2015.   Finally, patient has history of Vitamin D Deficiency of 28 in 2008 and last Vitamin D was 103 in Feb 2015, but patient self d/c'd her Vit D supplements as she stated she doesn't like to take pills.   Medication Sig  . Ascorbic Acid (VITAMIN C PO) Take by mouth daily. Takes qod  . aspirin 81 MG tablet  Take 81 mg by mouth daily.  . calcium carbonate (TUMS) 500 MG chewable tablet Chew 1 tablet by mouth as needed for indigestion or heartburn.  . Cyanocobalamin (VITAMIN B-12 PO) Take by mouth daily. Takes qod  . Iron-Vitamins (GERITOL PO) Take by mouth daily.   No Known Allergies   Past Medical History  Diagnosis Date  . Labile hypertension   . Hyperlipidemia   . Prediabetes   . Vitamin D deficiency   . Osteoporosis   . IBS (irritable bowel syndrome)   . History of breast cancer    Health Maintenance  Topic Date Due  . COLONOSCOPY  05/27/1989  . ZOSTAVAX  05/28/1999  . PNA vac Low Risk Adult (1 of 2 - PCV13) 05/27/2004  . INFLUENZA VACCINE  12/25/2014  . TETANUS/TDAP  01/04/2018  . DEXA SCAN  Completed   Immunization History  Administered Date(s) Administered  . Influenza Split 02/07/2013  . Td 01/05/2008   Past Surgical History  Procedure Laterality Date  . Appendectomy  1977  . Abdominal hysterectomy      1980  . Thyroid cyst excision  1967  . Breast surgery Right 1985   Family History  Problem Relation Age of Onset  . Heart disease Mother   . Hypertension Mother   . Heart disease Father   . Diabetes Father    History  Substance Use Topics  . Smoking status: Former Research scientist (life sciences)  . Smokeless tobacco: Former  User    Quit date: 07/08/1963  . Alcohol Use: Yes     Comment: Rare    ROS Constitutional: Denies fever, chills, weight loss/gain, headaches, insomnia, fatigue, night sweats, and change in appetite. Eyes: Denies redness, blurred vision, diplopia, discharge, itchy, watery eyes.  ENT: Denies discharge, congestion, post nasal drip, epistaxis, sore throat, earache, hearing loss, dental pain, Tinnitus, Vertigo, Sinus pain, snoring.  Cardio: Denies chest pain, palpitations, irregular heartbeat, syncope, dyspnea, diaphoresis, orthopnea, PND, claudication, edema Respiratory: denies cough, dyspnea, DOE, pleurisy, hoarseness, laryngitis, wheezing.  Gastrointestinal:  Denies dysphagia, heartburn, reflux, water brash, pain, cramps, nausea, vomiting, bloating, diarrhea, constipation, hematemesis, melena, hematochezia, jaundice, hemorrhoids Genitourinary: Denies dysuria, frequency, urgency, nocturia, hesitancy, discharge, hematuria, flank pain Breast: Denies SBE's. Musculoskeletal: Denies arthralgia, myalgia, stiffness, Jt. Swelling, pain, limp, and strain/sprain. Denies falls. Skin: Denies puritis, rash, hives, warts, acne, eczema, changing in skin lesion Neuro: No weakness, tremor, incoordination, spasms, paresthesia, pain Psychiatric: Denies confusion, memory loss, sensory loss. Denies Depression. Endocrine: Denies change in weight, skin, hair change, nocturia, and paresthesia, diabetic polys, visual blurring, hyper / hypo glycemic episodes.  Heme/Lymph: No excessive bleeding, bruising, enlarged lymph nodes.  Physical Exam   BP 136/90   Pulse 104  Temp 97.5 F Resp 16  Ht 5' 2"   Wt 120 lb 6.4 oz     BMI 22.02   General Appearance: Well nourished and in no apparent distress. Eyes: PERRLA, EOMs, conjunctiva no swelling or erythema, normal fundi and vessels. Sinuses: No frontal/maxillary tenderness ENT/Mouth: EACs patent / TMs  nl. Nares clear without erythema, swelling, mucoid exudates. Oral hygiene is good. No erythema, swelling, or exudate. Tongue normal, non-obstructing. Tonsils not swollen or erythematous. Hearing normal.  Neck: Supple, thyroid normal. No bruits, nodes or JVD. Respiratory: Respiratory effort normal.  BS equal and clear bilateral without rales, rhonci, wheezing or stridor. Cardio: Heart sounds are normal with regular rate and rhythm and no murmurs, rubs or gallops. Peripheral pulses are normal and equal bilaterally without edema. No aortic or femoral bruits. Chest: moderate kyphosis to gibbous type deformitysymmetric with normal excursions and percussion. Breasts: Atrophic with a palpable firm irregular mobile mass in the Left Breast  at 2 o'clock w/o overlying skin changes. Breast exams otherwise unremarkable with axillae clear.  Abdomen: Flat, soft, with bowl sounds. Nontender, no guarding, rebound, hernias, masses, or organomegaly.  Lymphatics: Non tender without lymphadenopathy.  Genitourinary:  Musculoskeletal: Full ROM all peripheral extremities, joint stability, 5/5 strength, and normal gait. Skin: Warm and dry without rashes, lesions, cyanosis, clubbing or  ecchymosis.  Neuro: Cranial nerves intact, reflexes equal bilaterally. Normal muscle tone, no cerebellar symptoms. Sensation intact.  Pysch: Alert and oriented X 3, Flat affect today reporting that she feels very nervous. Insight and Judgment appropriate.   Assessment and Plan  1. Labile hypertension  - Microalbumin / creatinine urine ratio - EKG 12-Lead - [place], RETROPERITNL ABD,  LTD - TSH  2. Hyperlipidemia  - Lipid panel  3. Prediabetes  - Hemoglobin A1c - Insulin, random  4. Vitamin D deficiency  - Vit D  25 hydroxy (rtn osteoporosis monitoring)  5. 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 Breast cancer metastasized to multiple sites, Left  - has oncology appt with Dr Jana Hakim in 3 days.  7. Depression screen  - Screen negative  8. Screening for rectal cancer   9. Osteoporosis   10. Medication management  - Magnesium   Continue prudent diet as discussed, weight  control, BP monitoring, regular exercise, and medications. Discussed med's effects and SE's. Screening labs and tests as requested with regular follow-up as recommended. Over 40 minutes of exam, counseling, chart review was performed.

## 2014-08-29 ENCOUNTER — Other Ambulatory Visit: Payer: Self-pay | Admitting: Internal Medicine

## 2014-08-29 ENCOUNTER — Ambulatory Visit
Admission: RE | Admit: 2014-08-29 | Discharge: 2014-08-29 | Disposition: A | Discharge status: Home / Self Care | Payer: Medicare Other | Source: Ambulatory Visit | Attending: Internal Medicine | Admitting: Internal Medicine

## 2014-08-29 DIAGNOSIS — R922 Inconclusive mammogram: Secondary | ICD-10-CM | POA: Diagnosis not present

## 2014-08-29 DIAGNOSIS — C50912 Malignant neoplasm of unspecified site of left female breast: Secondary | ICD-10-CM

## 2014-08-29 DIAGNOSIS — N63 Unspecified lump in breast: Secondary | ICD-10-CM | POA: Diagnosis not present

## 2014-08-29 DIAGNOSIS — C50212 Malignant neoplasm of upper-inner quadrant of left female breast: Secondary | ICD-10-CM | POA: Diagnosis not present

## 2014-08-29 DIAGNOSIS — N6320 Unspecified lump in the left breast, unspecified quadrant: Secondary | ICD-10-CM

## 2014-08-29 LAB — MICROALBUMIN / CREATININE URINE RATIO
Creatinine, Urine: 19.7 mg/dL
MICROALB/CREAT RATIO: 15.2 mg/g (ref 0.0–30.0)
Microalb, Ur: 0.3 mg/dL (ref <2.0)

## 2014-08-29 LAB — VITAMIN D 25 HYDROXY (VIT D DEFICIENCY, FRACTURES): Vit D, 25-Hydroxy: 39 ng/mL (ref 30–100)

## 2014-08-29 LAB — INSULIN, RANDOM: Insulin: 13.2 u[IU]/mL (ref 2.0–19.6)

## 2014-08-30 ENCOUNTER — Other Ambulatory Visit: Payer: Self-pay | Admitting: Internal Medicine

## 2014-08-30 ENCOUNTER — Other Ambulatory Visit: Payer: Self-pay | Admitting: *Deleted

## 2014-08-30 DIAGNOSIS — C50912 Malignant neoplasm of unspecified site of left female breast: Secondary | ICD-10-CM

## 2014-08-30 DIAGNOSIS — C7951 Secondary malignant neoplasm of bone: Secondary | ICD-10-CM

## 2014-08-31 ENCOUNTER — Other Ambulatory Visit (HOSPITAL_BASED_OUTPATIENT_CLINIC_OR_DEPARTMENT_OTHER): Payer: Medicare Other

## 2014-08-31 ENCOUNTER — Encounter: Payer: Self-pay | Admitting: Oncology

## 2014-08-31 ENCOUNTER — Ambulatory Visit: Payer: Medicare Other

## 2014-08-31 ENCOUNTER — Ambulatory Visit (HOSPITAL_BASED_OUTPATIENT_CLINIC_OR_DEPARTMENT_OTHER): Payer: Medicare Other | Admitting: Oncology

## 2014-08-31 VITALS — BP 153/82 | HR 109 | Temp 97.8°F | Resp 18 | Ht 62.0 in | Wt 120.3 lb

## 2014-08-31 DIAGNOSIS — M545 Low back pain: Secondary | ICD-10-CM

## 2014-08-31 DIAGNOSIS — C50212 Malignant neoplasm of upper-inner quadrant of left female breast: Secondary | ICD-10-CM | POA: Diagnosis not present

## 2014-08-31 DIAGNOSIS — E559 Vitamin D deficiency, unspecified: Secondary | ICD-10-CM

## 2014-08-31 DIAGNOSIS — Z853 Personal history of malignant neoplasm of breast: Secondary | ICD-10-CM

## 2014-08-31 DIAGNOSIS — M810 Age-related osteoporosis without current pathological fracture: Secondary | ICD-10-CM

## 2014-08-31 DIAGNOSIS — E785 Hyperlipidemia, unspecified: Secondary | ICD-10-CM

## 2014-08-31 DIAGNOSIS — C7951 Secondary malignant neoplasm of bone: Secondary | ICD-10-CM

## 2014-08-31 DIAGNOSIS — Z79899 Other long term (current) drug therapy: Secondary | ICD-10-CM

## 2014-08-31 DIAGNOSIS — R7303 Prediabetes: Secondary | ICD-10-CM

## 2014-08-31 DIAGNOSIS — C50912 Malignant neoplasm of unspecified site of left female breast: Secondary | ICD-10-CM

## 2014-08-31 LAB — CBC WITH DIFFERENTIAL/PLATELET
BASO%: 0.8 % (ref 0.0–2.0)
BASOS ABS: 0.1 10*3/uL (ref 0.0–0.1)
EOS ABS: 0.2 10*3/uL (ref 0.0–0.5)
EOS%: 3.4 % (ref 0.0–7.0)
HCT: 41.4 % (ref 34.8–46.6)
HGB: 14.2 g/dL (ref 11.6–15.9)
LYMPH#: 1.4 10*3/uL (ref 0.9–3.3)
LYMPH%: 24.2 % (ref 14.0–49.7)
MCH: 31.5 pg (ref 25.1–34.0)
MCHC: 34.3 g/dL (ref 31.5–36.0)
MCV: 91.8 fL (ref 79.5–101.0)
MONO#: 0.5 10*3/uL (ref 0.1–0.9)
MONO%: 9.1 % (ref 0.0–14.0)
NEUT%: 62.5 % (ref 38.4–76.8)
NEUTROS ABS: 3.7 10*3/uL (ref 1.5–6.5)
PLATELETS: 269 10*3/uL (ref 145–400)
RBC: 4.51 10*6/uL (ref 3.70–5.45)
RDW: 14.5 % (ref 11.2–14.5)
WBC: 5.9 10*3/uL (ref 3.9–10.3)

## 2014-08-31 LAB — COMPREHENSIVE METABOLIC PANEL (CC13)
ALBUMIN: 3.8 g/dL (ref 3.5–5.0)
ALK PHOS: 56 U/L (ref 40–150)
ALT: 15 U/L (ref 0–55)
AST: 21 U/L (ref 5–34)
Anion Gap: 10 mEq/L (ref 3–11)
BILIRUBIN TOTAL: 0.29 mg/dL (ref 0.20–1.20)
BUN: 16.4 mg/dL (ref 7.0–26.0)
CO2: 29 mEq/L (ref 22–29)
Calcium: 10.4 mg/dL (ref 8.4–10.4)
Chloride: 104 mEq/L (ref 98–109)
Creatinine: 0.7 mg/dL (ref 0.6–1.1)
EGFR: 86 mL/min/{1.73_m2} — ABNORMAL LOW (ref >90)
Glucose: 94 mg/dl (ref 70–140)
POTASSIUM: 4.6 meq/L (ref 3.5–5.1)
SODIUM: 142 meq/L (ref 136–145)
TOTAL PROTEIN: 6.8 g/dL (ref 6.4–8.3)

## 2014-08-31 MED ORDER — LETROZOLE 2.5 MG PO TABS: 2.5000 mg | ORAL_TABLET | Freq: Every day | ORAL | Status: DC

## 2014-08-31 NOTE — Progress Notes (Signed)
Checked in new pt with no financial concerns at this time.  Pt has 2 insurances so financial assistance may not be needed but she has my card for any billing questions or concerns.  ° °

## 2014-08-31 NOTE — Progress Notes (Signed)
[place]  Telephone:([phone] Fax:([phone]) [phone]     ID: ARES TEGTMEYER DOB: 1941-02-01  MR#: [id number]  XTG#:[id number]  Patient Care Team: April Pinto, MD as PCP - General (Internal Medicine) April Cruel, MD as Consulting Physician (Oncology) PCP: April Richards, MD GYN: SU: April Rosales M.D. OTHER MD:  April Rosales M.D., April Chimera MD  CHIEF COMPLAINT: Stage IV breast cancer  CURRENT TREATMENT:  Letrozole   BREAST CANCER HISTORY:   April Rosales underwent right lumpectomy in 1985 at [facility] for  What sounds like a stage I breast cancer. She tells me she had more than 30 lymph nodes removed from her right axilla and all of them were clear. She received  Adjuvant radiation but no systemic treatment.  The patient has consistently refused mammography with the last mammogram I can find dating back to August 2010.  More recently  patient presented with left scapular pain radiating down the left arm.  She was evaluated by Dr. Lorin Rosales,  Who obtained a chest x-ray showing a possible abnormality at T3. He then set up the patient for an MRI  Of the thoracic spine performed 08/16/2014.  This showed multiple compression fractures  (a similar picture had been noted on lumbar MRI 10/09/2011 ). However at T3  They noted tumor in the vertebral body extending into the left pedicle Ann into the lateral epidural space, displacing the cord to the right. There was no evidence of cord compression or cord signal abnormality. There were no other areas of tumor identified in the thoracic spine.          the patient was then referred to Dr. Hal Rosales who on 08/24/2014 set April Rosales up for CT scans of the chest, abdomen and pelvis. There was a dense mass in the upper inner quadrant of the left breast measuring 1.6 cm. There was a 1.2 cm nodule in the minor fissure of the right lung and some evidence of right lung fibrosis at the site of the prior radiation port.   There was also a thyroid mass measuring 2 cm. However there were no parenchymal lung or liverlesions. Incidental meningocele's were noted as well as sclerosis of the fifth and sixth ribs which were felt to be likely posttraumatic.   On 08/29/2014 the patient underwent bilateral diagnostic mammography with tomosynthesis and left breast ultrasonography.  There were postsurgical changes in the upper right breast. In the left breast there was an irregular mass measuring 2.3 cm in the upper inner quadrant. This was palpable. Ultrasound showed this to be hypoechoic and to measure 2.0 cm. There were adjacent areas of nodularity. There was no definite lymphadenopathy in the left axilla.  Biopsy of this mass 08/29/2014 showed an invasive breast cancer with both ductal and lobular features (there was strong diffuse E-cadherin expression as well as areas with total absence of E-cadherin expression), with the preliminary prognostic profile showing strong estrogen positivity, very weak to near absent progesterone positivity, an MIB-1 of approximately 40%, and HER-2 pending.  The patient's subsequent history is as detailed below.  INTERVAL HISTORY:  April Rosales was evaluated in the breast clinic on 08/31/2014 accompanied by her sister, April Rosales.  REVIEW OF SYSTEMS:  April Rosales is still having left shoulder pain. This has not worsened since it developed in the last several weeks. She controls it with Tylenol. She also has some back pain and of course she has a history of multiple compression fractures secondary to osteoporosis. She denies unusual headaches, visual changes,  nausea, vomiting, dizziness, or gait imbalance. There are no focal motor symptoms. She denies cough, phlegm production, pleurisy, shortness of breath, chest pain or pressure, palpitations, or change in bowel or bladder habits. A detailed review of systems today was otherwise noncontributory  PAST MEDICAL HISTORY: Past Medical History  Diagnosis  Date  . Labile hypertension   . Hyperlipidemia   . Prediabetes   . Vitamin D deficiency   . Osteoporosis   . IBS (irritable bowel syndrome)   . History of breast cancer     PAST SURGICAL HISTORY: Past Surgical History  Procedure Laterality Date  . Appendectomy  1977  . Abdominal hysterectomy      1980  . Thyroid cyst excision  1967  . Breast surgery Right 1985    FAMILY HISTORY Family History  Problem Relation Age of Onset  . Heart disease Mother   . Hypertension Mother   . Heart disease Father   . Diabetes Father    The patient's father died from a heart attack at the age of 29. He had 9 sisters. 3 of those sisters had breast cancer, all in a menopausal setting. Another sister had ovarian cancer. One of the paternal uncles had cancer of the colon "and back".  The patient's mother died at the age of 64. She was found to have breast cancer shortly before dying , during her final hospitalization.  GYNECOLOGIC HISTORY:  No LMP recorded.  Menarche age 70, first live birth age 32, the patient is GX P1. She underwent hysterectomy in 1980. She thinks the ovaries were removed, but the CT scan obtained 08/24/2014 showed a definite right ovary. The left ovary may have been removed. She did not take hormone replacement after the hysterectomy.  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 She is divorced, lives alone, with 2 cats. Her son April Rosales lives in [place] where he works in Engineer, technical sales. He has 4 children of his own. The patient is a Psychologist, forensic.    ADVANCED DIRECTIVES:  In place. The patient has named her sister April Rosales [phone]) and her friend April Rosales) [phone]) as joint healthcare powers of attorney   HEALTH MAINTENANCE: History  Substance Use Topics  . Smoking status: Former Research scientist (life sciences)  . Smokeless tobacco: Former Systems developer    Quit date: 07/08/1963  . Alcohol Use: Yes     Comment: Rare     Colonoscopy: never  PAP: status post  hysterectomy  Bone density: 08/14/2014 at [facility], results pending  Lipid panel:  No Known Allergies  Current Outpatient Prescriptions  Medication Sig Dispense Refill  . Ascorbic Acid (VITAMIN C PO) Take by mouth daily. Takes qod    . aspirin 81 MG tablet Take 81 mg by mouth daily.    . calcium carbonate (TUMS) 500 MG chewable tablet Chew 1 tablet by mouth as needed for indigestion or heartburn.    . Cyanocobalamin (VITAMIN B-12 PO) Take by mouth daily. Takes qod    . Iron-Vitamins (GERITOL PO) Take by mouth daily.    Marland Kitchen letrozole (FEMARA) 2.5 MG tablet Take 1 tablet (2.5 mg total) by mouth daily. 90 tablet 12   No current facility-administered medications for this visit.    OBJECTIVE:  Older white woman who appears stated age 39 Vitals:   08/31/14 1636  BP: 153/82  Pulse: 109  Temp: 97.8 F (36.6 C)  Resp: 18     Body mass index is 22 kg/(m^2).    ECOG FS:1 - Symptomatic but completely ambulatory  Ocular: Sclerae unicteric, pupils equal, round and reactive to light , EOMs intact Ear-nose-throat: Oropharynx clear, dentition in good repair Lymphatic: No cervical or supraclavicular adenopathy Lungs no rales or rhonchi, good excursion bilaterally Heart regular rate and rhythm, no murmur appreciated Abd soft, nontender, positive bowel sounds , no masses palpated MSK no focal spinal tenderness including over the T3 area,  Normal straight leg raising bilaterally , no joint edema Neuro:  Motor exam is entirely non-focal,  5 over 5 throughout ;  The patient is well-oriented,  withappropriate affect Breasts:  The right breast is status post remote lumpectomy and axillary dissection. There are areas of firmness in the upper portion consistent with scarring. The right axilla is benign. In the left breast upper inner quadrant there is an easily palpable mass which alters the breast contour. It measures between 2 and 3 cm. There are no other palpable masses and no skin or nipple changes of  concern. I do not palpate any left axillary masses   LAB RESULTS:  CMP     Component Value Date/Time   NA 142 08/31/2014 1529   NA 141 08/21/2014 1713   K 4.6 08/31/2014 1529   K 4.4 08/21/2014 1713   CL 102 08/21/2014 1713   CO2 29 08/31/2014 1529   CO2 28 08/21/2014 1713   GLUCOSE 94 08/31/2014 1529   GLUCOSE 86 08/21/2014 1713   BUN 16.4 08/31/2014 1529   BUN 13 08/21/2014 1713   CREATININE 0.7 08/31/2014 1529   CREATININE 0.59 08/21/2014 1713   CALCIUM 10.4 08/31/2014 1529   CALCIUM 9.9 08/21/2014 1713   PROT 6.8 08/31/2014 1529   PROT 6.7 08/21/2014 1713   ALBUMIN 3.8 08/31/2014 1529   ALBUMIN 4.2 08/21/2014 1713   AST 21 08/31/2014 1529   AST 18 08/21/2014 1713   ALT 15 08/31/2014 1529   ALT 13 08/21/2014 1713   ALKPHOS 56 08/31/2014 1529   ALKPHOS 53 08/21/2014 1713   BILITOT 0.29 08/31/2014 1529   BILITOT 0.4 08/21/2014 1713   GFRNONAA >89 08/21/2014 1713   GFRAA >89 08/21/2014 1713    INo results found for: SPEP, UPEP  Lab Results  Component Value Date   WBC 5.9 08/31/2014   NEUTROABS 3.7 08/31/2014   HGB 14.2 08/31/2014   HCT 41.4 08/31/2014   MCV 91.8 08/31/2014   PLT 269 08/31/2014      Chemistry      Component Value Date/Time   NA 142 08/31/2014 1529   NA 141 08/21/2014 1713   K 4.6 08/31/2014 1529   K 4.4 08/21/2014 1713   CL 102 08/21/2014 1713   CO2 29 08/31/2014 1529   CO2 28 08/21/2014 1713   BUN 16.4 08/31/2014 1529   BUN 13 08/21/2014 1713   CREATININE 0.7 08/31/2014 1529   CREATININE 0.59 08/21/2014 1713      Component Value Date/Time   CALCIUM 10.4 08/31/2014 1529   CALCIUM 9.9 08/21/2014 1713   ALKPHOS 56 08/31/2014 1529   ALKPHOS 53 08/21/2014 1713   AST 21 08/31/2014 1529   AST 18 08/21/2014 1713   ALT 15 08/31/2014 1529   ALT 13 08/21/2014 1713   BILITOT 0.29 08/31/2014 1529   BILITOT 0.4 08/21/2014 1713       No results found for: LABCA2  No components found for: LABCA125  No results for input(s): INR in  the last 168 hours.  Urinalysis No results found for: COLORURINE, APPEARANCEUR, [place], [place], [place], [place], [place], [place], [place], [place], NITRITE, LEUKOCYTESUR  STUDIES: Ct Chest W Contrast  08/24/2014   CLINICAL DATA:  Left scapular pain and tenderness extending to the left arm for the past 4 weeks. Remote history of breast cancer. Thoracic spine MRI concerning for malignancy at the T3 vertebral level  EXAM: CT CHEST, ABDOMEN, AND PELVIS WITH CONTRAST  TECHNIQUE: Multidetector CT imaging of the chest, abdomen and pelvis was performed following the standard protocol during bolus administration of intravenous contrast.  CONTRAST:  100 cc Isovue 300  COMPARISON:  08/16/2014  FINDINGS: CT CHEST FINDINGS  Mediastinum/Nodes: Hypodense right thyroid lesion 2.0 by 1.2 cm, image 6 series 3.  There is a dense mass in the upper inner quadrant of left breast measuring 1.6 by 1.6 cm on images 33-34 of series 3.  Lungs/Pleura: Volume loss in the right middle lobe with IA 1.2 by 0.8 cm nodule along the minor fissure, image 29 series 4. Above this level there are findings likely related to a right-sided radiation port.  Musculoskeletal: As noted on MRI there is obstructive lesion of the left T3 vertebra with a mass extending into the spinal canal, destroying the left pedicle, and extending into the left posterior elements as well as into the left T3-4 and possible T2-3 neural foramina. At T2-3 there is a fluid density lateral extraforaminal structure between the second and third ribs, compatible with lateral thoracic meningocele.  Mild sclerosis posteriorly in the left fifth and sixth ribs are at adjacent levels and may simply be posttraumatic or incidental. Chronic compression at T6. Chronic compression at T10.  CT ABDOMEN PELVIS FINDINGS  Hepatobiliary: Unremarkable  Pancreas: Unremarkable  Spleen: Unremarkable  Adrenals/Urinary Tract: Unremarkable  Stomach/Bowel: Sigmoid diverticulosis without  active diverticulitis.  Vascular/Lymphatic: Unremarkable  Reproductive: Uterus absent. Right ovary unremarkable. Left ovary not well seen.  Other: No supplemental non-categorized findings.  Musculoskeletal: Compression fractures at L1, L 2, and L3, with associated mild posterior bony retropulsion but without categorical evidence of underlying malignant lesion.  IMPRESSION: 1. Destructive T3 vertebral lesion eccentric to the left with extension into the spinal canal, as noted and detailed on prior thoracic spine MRI. 2. Suspected mass in the upper medial left breast concerning for breast cancer, measuring 1.6 cm in diameter. Diagnostic mammographic workup recommended. 3. 1.2 by 0.8 cm nodule along the minor fissure could reflect a metastatic lesion. Nuclear medicine PET-CT may be part of this patient's staging workup. 4. Chronic likely benign compression fractures at T6, T10, L1, L2, and L3, compatible with osteoporosis. 5. Right thyroid nodule, 2.0 by 1.2 cm, low-density. Consider further evaluation with thyroid ultrasound. If patient is clinically hyperthyroid, consider nuclear medicine thyroid uptake and scan. 6. Faint sclerosis in the left fifth and sixth ribs noted at adjacent levels, accordingly probably posttraumatic/incidental rather than being due to osseous metastatic disease.   Electronically Signed   By: Van Clines M.D.   On: 08/24/2014 15:34   Mr Thoracic Spine Wo Contrast  08/17/2014   CLINICAL DATA:  Thoracic back pain. History of fractures. History breast cancer 1980s  EXAM: MRI THORACIC SPINE WITHOUT CONTRAST  TECHNIQUE: Multiplanar, multisequence MR imaging of the thoracic spine was performed. No intravenous contrast was administered.  COMPARISON:  Chest two-view 01/02/2009.  Lumbar MRI 10/09/2011  FINDINGS: Pathologic fracture of T3. There is tumor in the T3 vertebral body on the left extending into the left pedicle and lamina. This extends into the lateral epidural space and displaces  the cord to the right. No cord compression. Cord signal abnormality. This is most likely  secondary to metastatic disease. No other areas of tumor identified in the thoracic spine.  Mild chronic compression fractures involve T4, T6, T10, T12, L1. Scattered hemangiomata/ fatty deposits in the vertebral bodies of T8-T10 and T11-T12.  Moderately large right paracentral disc protrusion T8-9 with cord flattening. Mild lumbar degenerative changes elsewhere. Lateral thoracic meningocele certain noted at multiple levels, the largest being on the left at T2-3 measuring approximately 22 mm in size.  23 x 17 mm cyst in the right anterior neck most likely within the right lobe of the thyroid. This is incompletely evaluated on the study.  IMPRESSION: Pathologic fracture T3 vertebral body on the left compatible with metastatic disease. Epidural tumor is present displacing the cord to the right. No cord compression. No additional metastatic deposits identified.  Numerous chronic benign compression fractures. Right paracentral disc protrusion T8-9.  These results will be called to the ordering clinician or representative by the Radiologist Assistant, and communication documented in the PACS or zVision Dashboard.   Electronically Signed   By: Franchot Gallo M.D.   On: 08/17/2014 08:20   Ct Abdomen Pelvis W Contrast  08/24/2014   CLINICAL DATA:  Left scapular pain and tenderness extending to the left arm for the past 4 weeks. Remote history of breast cancer. Thoracic spine MRI concerning for malignancy at the T3 vertebral level  EXAM: CT CHEST, ABDOMEN, AND PELVIS WITH CONTRAST  TECHNIQUE: Multidetector CT imaging of the chest, abdomen and pelvis was performed following the standard protocol during bolus administration of intravenous contrast.  CONTRAST:  100 cc Isovue 300  COMPARISON:  08/16/2014  FINDINGS: CT CHEST FINDINGS  Mediastinum/Nodes: Hypodense right thyroid lesion 2.0 by 1.2 cm, image 6 series 3.  There is a dense mass  in the upper inner quadrant of left breast measuring 1.6 by 1.6 cm on images 33-34 of series 3.  Lungs/Pleura: Volume loss in the right middle lobe with IA 1.2 by 0.8 cm nodule along the minor fissure, image 29 series 4. Above this level there are findings likely related to a right-sided radiation port.  Musculoskeletal: As noted on MRI there is obstructive lesion of the left T3 vertebra with a mass extending into the spinal canal, destroying the left pedicle, and extending into the left posterior elements as well as into the left T3-4 and possible T2-3 neural foramina. At T2-3 there is a fluid density lateral extraforaminal structure between the second and third ribs, compatible with lateral thoracic meningocele.  Mild sclerosis posteriorly in the left fifth and sixth ribs are at adjacent levels and may simply be posttraumatic or incidental. Chronic compression at T6. Chronic compression at T10.  CT ABDOMEN PELVIS FINDINGS  Hepatobiliary: Unremarkable  Pancreas: Unremarkable  Spleen: Unremarkable  Adrenals/Urinary Tract: Unremarkable  Stomach/Bowel: Sigmoid diverticulosis without active diverticulitis.  Vascular/Lymphatic: Unremarkable  Reproductive: Uterus absent. Right ovary unremarkable. Left ovary not well seen.  Other: No supplemental non-categorized findings.  Musculoskeletal: Compression fractures at L1, L 2, and L3, with associated mild posterior bony retropulsion but without categorical evidence of underlying malignant lesion.  IMPRESSION: 1. Destructive T3 vertebral lesion eccentric to the left with extension into the spinal canal, as noted and detailed on prior thoracic spine MRI. 2. Suspected mass in the upper medial left breast concerning for breast cancer, measuring 1.6 cm in diameter. Diagnostic mammographic workup recommended. 3. 1.2 by 0.8 cm nodule along the minor fissure could reflect a metastatic lesion. Nuclear medicine PET-CT may be part of this patient's staging workup. 4. Chronic likely  benign compression fractures at T6, T10, L1, L2, and L3, compatible with osteoporosis. 5. Right thyroid nodule, 2.0 by 1.2 cm, low-density. Consider further evaluation with thyroid ultrasound. If patient is clinically hyperthyroid, consider nuclear medicine thyroid uptake and scan. 6. Faint sclerosis in the left fifth and sixth ribs noted at adjacent levels, accordingly probably posttraumatic/incidental rather than being due to osseous metastatic disease.   Electronically Signed   By: Van Clines M.D.   On: 08/24/2014 15:34   Mm Digital Diagnostic Unilat L  08/30/2014   ADDENDUM REPORT: 08/30/2014 14:38  ADDENDUM: Pathology results: Pathology results from the ultrasound-guided biopsy of the mass in the left breast at 10 o'clock revealed invasive mammary carcinoma. This is concordant with the imaging findings. The patient has been notified of the results. She is doing well and denies any biopsy site complications.  She has an appointment with Dr. Dalbert Batman 09/07/2014 at 11:15 a.m. and is also scheduled to see Dr. Jana Hakim tomorrow 08/31/2014. A breast MRI is scheduled for 09/04/2014 at 9:30 a.m.  The patient has been instructed to call the [facility] with any questions or concerns.   Electronically Signed   By: Everlean Alstrom M.D.   On: 08/30/2014 14:38   08/30/2014   CLINICAL DATA:  Post ultrasound-guided biopsy of a highly suspicious mass in the upper inner left breast at 10 o'clock.  EXAM: DIAGNOSTIC LEFT MAMMOGRAM POST ULTRASOUND BIOPSY  COMPARISON:  Previous exams  FINDINGS: Mammographic images were obtained following ultrasound guided biopsy of the mass in the upper inner left breast at 10 o'clock. A ribbon shaped biopsy marking clip is present in the targeted region of the mass.  IMPRESSION: Appropriate position of ribbon shaped biopsy marking clip post biopsy of a highly suspicious mass in the left breast at 10 o'clock.  Final Assessment: Post Procedure Mammograms for Marker Placement   Electronically Signed: By: Everlean Alstrom M.D. On: 08/29/2014 11:43   US Breast Ltd Uni Left Inc Axilla  08/31/2014   ADDENDUM REPORT: 08/31/2014 14:09  ADDENDUM: Number 2 of the impression should read as follows: No mammographic evidence of malignancy in the RIGHT breast.   Electronically Signed   By: Claudie Revering M.D.   On: 08/31/2014 14:09   08/31/2014   CLINICAL DATA:  74 year old female with remote history of right breast cancer post lumpectomy in 1985. The patient had a recent CT demonstrating a destructive/metastatic upper thoracic spine lesion as well as a suspicious mass in the inner left breast.  EXAM: DIGITAL DIAGNOSTIC BILATERAL MAMMOGRAM WITH 3D TOMOSYNTHESIS AND CAD  LEFT BREAST ULTRASOUND  COMPARISON:  Previous exams.  ACR Breast Density Category c: The breast tissue is heterogeneously dense, which may obscure small masses.  FINDINGS: No suspicious masses or calcifications are seen in the right breast. Postsurgical changes are present in the far upper right breast with mild distortion related to prior lumpectomy. The CC and MLO tomosynthesis of the left breast as well as the spot compression tomosynthesis demonstrates irregular mass in the inner left breast measuring approximately 2.3 cm. This is at the site of palpable concern and appears to correspond with recent CT findings.  Mammographic images were processed with CAD.  Physical examination at site of palpable concern in the inner slightly upper left breast reveals a firm mass.  Targeted ultrasound of the left breast at site of palpable concern was performed demonstrating an irregular hypoechoic mass at 10 o'clock 5 cm from the nipple measuring approximately 2 x 1.2 x 1.3 cm. Adjacent  areas of nodularity connect to the dominant mass. This corresponds with mammography and CT findings. No definite lymphadenopathy seen in the left axilla.  IMPRESSION: 1. Highly suspicious left breast mass.  2.  No mammographic evidence of malignancy in the left  breast.  RECOMMENDATION: Ultrasound-guided biopsy of the suspicious mass in the upper inner left breast is recommended. This will subsequently be performed and dictated separately.  I have discussed the findings and recommendations with the patient. Results were also provided in writing at the conclusion of the visit. If applicable, a reminder letter will be sent to the patient regarding the next appointment.  BI-RADS CATEGORY  5: Highly suggestive of malignancy.  Electronically Signed: By: Everlean Alstrom M.D. On: 08/29/2014 11:02   Mm Diag Breast Tomo Bilateral  08/31/2014   ADDENDUM REPORT: 08/31/2014 14:09  ADDENDUM: Number 2 of the impression should read as follows: No mammographic evidence of malignancy in the RIGHT breast.   Electronically Signed   By: Claudie Revering M.D.   On: 08/31/2014 14:09   08/31/2014   CLINICAL DATA:  74 year old female with remote history of right breast cancer post lumpectomy in 1985. The patient had a recent CT demonstrating a destructive/metastatic upper thoracic spine lesion as well as a suspicious mass in the inner left breast.  EXAM: DIGITAL DIAGNOSTIC BILATERAL MAMMOGRAM WITH 3D TOMOSYNTHESIS AND CAD  LEFT BREAST ULTRASOUND  COMPARISON:  Previous exams.  ACR Breast Density Category c: The breast tissue is heterogeneously dense, which may obscure small masses.  FINDINGS: No suspicious masses or calcifications are seen in the right breast. Postsurgical changes are present in the far upper right breast with mild distortion related to prior lumpectomy. The CC and MLO tomosynthesis of the left breast as well as the spot compression tomosynthesis demonstrates irregular mass in the inner left breast measuring approximately 2.3 cm. This is at the site of palpable concern and appears to correspond with recent CT findings.  Mammographic images were processed with CAD.  Physical examination at site of palpable concern in the inner slightly upper left breast reveals a firm mass.  Targeted  ultrasound of the left breast at site of palpable concern was performed demonstrating an irregular hypoechoic mass at 10 o'clock 5 cm from the nipple measuring approximately 2 x 1.2 x 1.3 cm. Adjacent areas of nodularity connect to the dominant mass. This corresponds with mammography and CT findings. No definite lymphadenopathy seen in the left axilla.  IMPRESSION: 1. Highly suspicious left breast mass.  2.  No mammographic evidence of malignancy in the left breast.  RECOMMENDATION: Ultrasound-guided biopsy of the suspicious mass in the upper inner left breast is recommended. This will subsequently be performed and dictated separately.  I have discussed the findings and recommendations with the patient. Results were also provided in writing at the conclusion of the visit. If applicable, a reminder letter will be sent to the patient regarding the next appointment.  BI-RADS CATEGORY  5: Highly suggestive of malignancy.  Electronically Signed: By: Everlean Alstrom M.D. On: 08/29/2014 11:02   [place] Lt Breast Bx W Loc Dev 1st Lesion Img Bx Spec US Guide  08/29/2014   CLINICAL DATA:  Highly suspicious mass in the upper inner left breast.  EXAM: ULTRASOUND GUIDED LEFT BREAST CORE NEEDLE BIOPSY  COMPARISON:  Previous exam(s).  PROCEDURE: I met with the patient and we discussed the procedure of ultrasound-guided biopsy, including benefits and alternatives. We discussed the high likelihood of a successful procedure. We discussed the risks of  the procedure including infection, bleeding, tissue injury, clip migration, and inadequate sampling. Informed written consent was given. The usual time-out protocol was performed immediately prior to the procedure.  Using sterile technique and 2% Lidocaine as local anesthetic, under direct ultrasound visualization, a 12 gauge vacuum-assisted device was used to perform biopsy of the mass in the upper inner left breast at 10 o'clockusing a medial approach. At the conclusion of the procedure,  a ribbon shaped tissue marker clip was deployed into the biopsy cavity. Follow-up 2-view mammogram was performed and dictated separately.  IMPRESSION: Ultrasound-guided biopsy of the suspicious mass in the upper inner left breast at 10 o'clock. No apparent complications.   Electronically Signed   By: Everlean Alstrom M.D.   On: 08/29/2014 11:03    ASSESSMENT: 89 y.o. 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 CT scans of the chest, abdomen and pelvis 08/24/2014 showed in addition a mass in the upper left breast measuring 1.6 cm and a nodule in the minor fissure of the right lung measuring 1.2 cm, but no parenchymal lung or liver lesions    (3) mammography and ultrasonography 08/29/2014 show a mass in the upper inner left breast which was palpable,  measuring 2.0 cm by ultrasound. Biopsy of this mass 08/29/2014 showed an invasive breast cancer with both lobular and ductal features, estrogen receptor positive, progesterone receptor weakly positive, with an MIB-1 in the 40% range, HER-2 pending,   (4) letrozole started 08/31/2014; palbociclib to follow   (5)  zolendronate to start last week in April    (6) radiation oncology to consider palliative radiation to the T3 area   (7)  surgery to consider eventual left lumpectomy or mastectomy depending on results of systemic therapy  PLAN:  I spent a little over an hour with April Rosales and her sister going over April Rosales's situation. The cancer from the right breast was likely stage I and almost certainly cured with surgery and radiation in 1985. She has now a new breast cancer arising from the contralateral breast.  Per  radiology this has spread to T3 where there is bony disruption.  There are no other areas of major concern and specifically we don't see visceral involvement of the lungs or liver.   we discussed the fact that stage IV breast cancer is not curable. It is however treatable. The goal of treatment is control. The general plan is to do as little as is necessary to bring the cancer under control. We then discussed the difference between local treatment such as surgery and radiation, and systemic treatments such as anti-estrogens, anti-HER-2 immunotherapy, and chemotherapy   clearly April Rosales needs systemic treatment and I am starting her on letrozole tonight. She understands how this works in also it's possible toxicities, side effects and complications. We will try to obtain Palbociclib for her and we will discuss starting that drug later on this month. She will also benefit from zolendronate and I will schedule that with her later this month as well. We did discuss the possible toxicities and complications of zolendronate including the rare case of osteonecrosis of the jaw.    I am scheduling her to meet with radiation oncology as  soon as possible to consider radiation to T3. She will also meet with our genetics counselor. She is already scheduled to meet with surgery. At this point I would favor treating the cancer systemically and reassessing response by repeating a breast MRI in 3 months (of course we can also do it by physical exam ) rather than proceeding to lumpectomy or mastectomy. Similarly I would wait on a decision regarding thoracic spinal surgery until the radiation oncologist has had a chance to evaluate the possibility of SRS or IM RT in this case.   she was reluctant to have a breast MRI or MRI guided biopsy. I think it will be useful to have the breast MRI. The MRI guided biopsy can be postponed or perhaps omitted. I am hopeful her case can be presented at the multidisciplinary breast cancer  conference 09/13/2014, after she has met with surgery and radiation, so that a general plan can be discussed and affirmed at that time.  The patient has a good understanding of the overall plan. She agrees with it. She knows the goal of treatment in her case is control. She will call with any problems that may develop before her next visit here.  April Cruel, MD   08/31/2014 7:01 PM Medical Oncology and Hematology [facility] [address] [place], [place] [address] Tel. [phone]    Fax. [phone]

## 2014-09-04 ENCOUNTER — Ambulatory Visit
Admission: RE | Admit: 2014-09-04 | Discharge: 2014-09-04 | Disposition: A | Discharge status: Home / Self Care | Payer: Medicare Other | Source: Ambulatory Visit | Attending: Internal Medicine | Admitting: Internal Medicine

## 2014-09-04 ENCOUNTER — Telehealth: Payer: Self-pay | Admitting: Oncology

## 2014-09-04 DIAGNOSIS — C50212 Malignant neoplasm of upper-inner quadrant of left female breast: Secondary | ICD-10-CM | POA: Diagnosis not present

## 2014-09-04 DIAGNOSIS — C50912 Malignant neoplasm of unspecified site of left female breast: Secondary | ICD-10-CM

## 2014-09-04 MED ORDER — GADOBENATE DIMEGLUMINE 529 MG/ML IV SOLN
11.0000 mL | Freq: Once | INTRAVENOUS | Status: AC | PRN
  Administered 2014-09-04: 11 mL via INTRAVENOUS

## 2014-09-04 NOTE — Telephone Encounter (Signed)
made several attempts to call pt about appts-no anser no vm-cld #on pts demo (sister) to adv that pt had appts-pt cldl stated she did not want [place] to call sister-did not state remove # from account

## 2014-09-04 NOTE — Progress Notes (Signed)
Histology and Location of Primary Cancer: Left Breast 10' o'cloc Invasive mammary. ER+PR+ 08/29/14:FINAL DIAGNOSIS Diagnosis Breast, left, needle core biopsy, 10 o'clock - INVASIVE MAMMARY CARCINOMA, SEE COMMENT.  Sites of Visceral and Bony Metastatic Disease: Mets to T3 Lung mass of right lung visualized.  Location(s) of Symptomatic Metastases: Destructive T3 vertebral lesion to left with extension into spinal canal.  Past/Anticipated chemotherapy by medical oncology, if any: Plan to have systemic treatment, letrozole. Start 08/31/14 and zolendronate to start end of April.  Pain on a scale of 0-10 is: No   If Spine Met(s), symptoms, if any, include:  Bowel/Bladder retention or incontinence (please describe): No  Numbness or weakness in extremities (please describe): NO  Current Decadron regimen, if applicable:No  Ambulatory status? Walker? Wheelchair?: Patient ambulates independently.  SAFETY ISSUES:  Prior radiation? (1) status post right lumpectomy and axillary node dissection in 1985 followed by radiation  At [place] or 86.  Pacemaker/ICD? No  Possible current pregnancy? No  Is the patient on methotrexate? No  Current Complaints / other details:Menarche 45 First and only child age 49 No hormonal replacement therapy  Last menstrual cycle age 21.Hysterectomy Patient for consideration of palliative radiation to T3. Surgery to consider lumpectomy or mastectomy depending on results of systemic therapy.

## 2014-09-04 NOTE — Telephone Encounter (Signed)
cld pt # and no answer no vm-cld sister mary and left a messager and adv of appt times & date-adv to have pt get updated sch on 4/13

## 2014-09-04 NOTE — Addendum Note (Signed)
Addended by: Chauncey Cruel on: 09/04/2014 07:48 AM   Modules accepted: Orders, SmartSet

## 2014-09-05 ENCOUNTER — Other Ambulatory Visit: Payer: Self-pay | Admitting: General Surgery

## 2014-09-06 ENCOUNTER — Encounter: Payer: Self-pay | Admitting: *Deleted

## 2014-09-06 ENCOUNTER — Ambulatory Visit
Admission: RE | Admit: 2014-09-06 | Discharge: 2014-09-06 | Disposition: A | Discharge status: Home / Self Care | Payer: Medicare Other | Source: Ambulatory Visit | Attending: Radiation Oncology | Admitting: Radiation Oncology

## 2014-09-06 ENCOUNTER — Encounter: Payer: Self-pay | Admitting: Radiation Oncology

## 2014-09-06 VITALS — BP 146/83 | HR 112 | Temp 98.5°F | Wt 121.6 lb

## 2014-09-06 DIAGNOSIS — C50912 Malignant neoplasm of unspecified site of left female breast: Secondary | ICD-10-CM

## 2014-09-06 DIAGNOSIS — C799 Secondary malignant neoplasm of unspecified site: Secondary | ICD-10-CM | POA: Diagnosis not present

## 2014-09-06 NOTE — Progress Notes (Signed)
Please see the Nurse Progress Note in the MD Initial Consult Encounter for this patient. 

## 2014-09-06 NOTE — Progress Notes (Signed)
Met with pt during new pt appt with Dr. Pablo Ledger. Gave pt navigation resources and contact information. Denies needs or questions at this time. Encourage pt to call with concerns. Received verbal understanding.

## 2014-09-06 NOTE — Progress Notes (Signed)
Radiation Oncology         [phone]) [phone] ________________________________  Initial outpatient Consultation - Date: 09/06/2014   Name: April Rosales MRN: [id number]   DOB: Aug 18, 1940  REFERRING PHYSICIAN: Magrinat, [name], MD  DIAGNOSIS:    ICD-9-CM ICD-10-CM   1. Breast cancer metastasized to multiple sites, left 174.9 C50.912    199.0 C79.9     STAGE: Breast cancer metastasized to multiple sites   Staging form: Breast, AJCC 7th Edition     Clinical: Stage IV (TX, NX, M1) - Signed by Chauncey Cruel, MD on 08/31/2014  HISTORY OF PRESENT ILLNESS::[name] is a 74 y.o. female  Who was treated for a right breast cancer at [facility] with lumpectomy, ALND and radiation in 1985. She had no systemic treatment and has not undergone regular mammograms. She presented to her PCP with sudden onset back pain which happened while she was lying in bed and radiated to her shoulder and neck. Her PCP ordered an MRI of the spine which showed multiple benign compression fractures with a pathologic fracture of T3 extending into the left pedicle and lamina. This displaces the cord to the right. No other metastatic disease was noted. A CT of the chest/abdomen/pelvis was performed which showed a left breast mass and a right lung nodule. The breast mass was confirmed on biopsy and showed an invasive ductal carcinoma which was ER100%, PR 1% and Ki67 of 53%. Her2 was equivocal. She was referred to Dr. Hal Neer who recommended decompression and radiation. Her pain has resolved and yesterday she was pain free without medications. She has no neurological deficits and normal bowel and bladder. Dr. Jana Hakim placed her on letrazole and referred her to myself and Dr. Dalbert Batman. She is accompanied by her sister.   PREVIOUS RADIATION THERAPY: No  Past medical, social and family history were reviewed in the electronic chart. Review of symptoms was reviewed in the electronic chart. Medications were reviewed in the  electronic chart.   PHYSICAL EXAM:  Filed Vitals:   09/06/14 1446  BP: 146/83  Pulse: 112  Temp: 98.5 F (36.9 C)  .121 lb 9.6 oz (55.157 kg). Pleasant female. No distress. Alert and oriented 5/5 strength bilaterally. Kyphotic spine.   IMPRESSION: New left breast cancer with pathologic fracture of T3 vertebral body and a right lung nodule.   PLAN:We discussed her situation at length. I went over her MRI and CT scan with the patient and her sister. Common things being common she likely has metastatic breast cancer. It would be helpful to have a biopsy of this vertebral body to confirm that and possibly even the lung mass.  We discussed that she does not have acute pain so no urgent radation to the back is indicated. She may be a candidate for kyphoplasty (with biopsy) which I will discuss with Dr. Hal Neer. She may also need decompression with instrumentation if this lesion is not amenable to kyphoplasty. She would then need daily radiation. [facility] cancer is not typically treated with post operative SRS). I will discuss with Dr. Jana Hakim, interventional radiology and Dr. Hal Neer.  She will be presented at tumor conference on Monday. At this point, we definitely need tissue from the spine to send for repeat HER2 and confirm metastatic breast cancer. I really think it's too early to tell what we can do with her left breast until we get these other malignancies sorted out. She is on the letrazole.    I spent 60 minutes  face to  face with the patient and more than 50% of that time was spent in counseling and/or coordination of care.   ------------------------------------------------  Thea Silversmith, MD

## 2014-09-07 DIAGNOSIS — E785 Hyperlipidemia, unspecified: Secondary | ICD-10-CM | POA: Diagnosis not present

## 2014-09-07 DIAGNOSIS — I10 Essential (primary) hypertension: Secondary | ICD-10-CM | POA: Diagnosis not present

## 2014-09-07 DIAGNOSIS — Z90722 Acquired absence of ovaries, bilateral: Secondary | ICD-10-CM | POA: Diagnosis not present

## 2014-09-07 DIAGNOSIS — C50212 Malignant neoplasm of upper-inner quadrant of left female breast: Secondary | ICD-10-CM | POA: Diagnosis not present

## 2014-09-07 DIAGNOSIS — C50912 Malignant neoplasm of unspecified site of left female breast: Secondary | ICD-10-CM | POA: Diagnosis not present

## 2014-09-07 DIAGNOSIS — Z9049 Acquired absence of other specified parts of digestive tract: Secondary | ICD-10-CM | POA: Diagnosis not present

## 2014-09-07 DIAGNOSIS — R911 Solitary pulmonary nodule: Secondary | ICD-10-CM | POA: Diagnosis not present

## 2014-09-07 DIAGNOSIS — Z90710 Acquired absence of both cervix and uterus: Secondary | ICD-10-CM | POA: Diagnosis not present

## 2014-09-07 DIAGNOSIS — Z9889 Other specified postprocedural states: Secondary | ICD-10-CM | POA: Diagnosis not present

## 2014-09-07 DIAGNOSIS — E041 Nontoxic single thyroid nodule: Secondary | ICD-10-CM | POA: Diagnosis not present

## 2014-09-07 DIAGNOSIS — C7951 Secondary malignant neoplasm of bone: Secondary | ICD-10-CM | POA: Diagnosis not present

## 2014-09-07 DIAGNOSIS — Z9079 Acquired absence of other genital organ(s): Secondary | ICD-10-CM | POA: Diagnosis not present

## 2014-09-13 ENCOUNTER — Ambulatory Visit (HOSPITAL_BASED_OUTPATIENT_CLINIC_OR_DEPARTMENT_OTHER): Payer: Medicare Other | Admitting: Oncology

## 2014-09-13 VITALS — BP 169/80 | HR 116 | Temp 98.2°F | Resp 18 | Ht 62.0 in | Wt 121.3 lb

## 2014-09-13 DIAGNOSIS — M810 Age-related osteoporosis without current pathological fracture: Secondary | ICD-10-CM

## 2014-09-13 DIAGNOSIS — C50919 Malignant neoplasm of unspecified site of unspecified female breast: Secondary | ICD-10-CM

## 2014-09-13 DIAGNOSIS — R911 Solitary pulmonary nodule: Secondary | ICD-10-CM

## 2014-09-13 DIAGNOSIS — C50212 Malignant neoplasm of upper-inner quadrant of left female breast: Secondary | ICD-10-CM | POA: Diagnosis not present

## 2014-09-13 DIAGNOSIS — C7951 Secondary malignant neoplasm of bone: Secondary | ICD-10-CM

## 2014-09-13 DIAGNOSIS — Z853 Personal history of malignant neoplasm of breast: Secondary | ICD-10-CM

## 2014-09-13 NOTE — Progress Notes (Signed)
[facility]  [phone] [phone]     ID: April Rosales DOB: Apr 28, 1941  MR#: [id number]  GLO#:[id number]  Patient Care Team: April Pinto, MD as PCP - General (Internal Medicine) April Cruel, MD as Consulting Physician (Oncology) PCP: April Richards, MD GYN: SU: April Rosales M.D. OTHER MD:  April Rosales M.D., April Chimera MD, April Rosales M.D.  CHIEF COMPLAINT: Stage IV breast cancer  CURRENT TREATMENT:  Letrozole   BREAST CANCER HISTORY: From the original intake note:    April Rosales underwent right lumpectomy in 1985 at [facility] for what sounds like a stage I breast cancer. She tells me she had more than 30 lymph nodes removed from her right axilla and all of them were clear. She received  adjuvant radiation but no systemic treatment.  The patient had recently refused mammography with the last mammogram I can find dating back to August 2010.  More recently the patient presented with left scapular pain radiating down the left arm.  She was evaluated by Dr. Lorin Rosales, who obtained a chest x-ray showing a possible abnormality at T3. He then set up the patient for an MRI of the thoracic spine performed 08/16/2014.  This showed multiple compression fractures  (a similar picture had been noted on lumbar MRI 10/09/2011 ). However at T3 they noted tumor in the vertebral body extending into the left pedicle and into the lateral epidural space, displacing the cord to the right. There was no evidence of cord compression or cord signal abnormality. There were no other areas of tumor identified in the thoracic spine.         The patient was then referred to Dr. Hal Rosales who on 08/24/2014 set April Rosales up for CT scans of the chest, abdomen and pelvis. There was a dense mass in the upper inner quadrant of the left breast measuring 1.6 cm. There was a 1.2 cm nodule in the minor fissure of the right lung and some evidence of right lung  fibrosis at the site of the prior radiation port.  There was also a thyroid mass measuring 2 cm. However there were no parenchymal lung or liver lesions. Incidental meningoceles were noted as well as sclerosis of the fifth and sixth ribs which were felt to be likely posttraumatic.   On 08/29/2014 the patient underwent bilateral diagnostic mammography with tomosynthesis and left breast ultrasonography.  There were postsurgical changes in the upper right breast. In the left breast there was an irregular mass measuring 2.3 cm in the upper inner quadrant. This was palpable. Ultrasound showed this to be hypoechoic and to measure 2.0 cm. There were adjacent areas of nodularity. There was no definite lymphadenopathy in the left axilla.  Biopsy of this breast mass 08/29/2014 showed an invasive adenocarcinoma with both ductal and lobular features (there was strong diffuse E-cadherin expression as well as areas with total absence of E-cadherin expression), with the preliminary prognostic profile showing strong estrogen positivity, very weak to near absent progesterone positivity, an MIB-1 of approximately 40%, and HER-2 equivocal  The patient's subsequent history is as detailed below.  INTERVAL HISTORY:  April Rosales returns today for follow-up of breast cancer accompanied by her sister, April Rosales and herniece April Rosales, since the last visit here [name] has started letrozole. She is obtaining it at $25 for a three-month supply. She had some loose bowel movements although not more frequent bowel movements than usual, with some abdominal cramping, for a few days, but those symptoms have resolved. She is  now having regular bowel movements with no cramping. She hasn't had any problems with hot flashes so far. It is a little early but she also has not had any arthralgias myalgias to report.--Since her last visit here also her case was discussed at the brain and spine multidisciplinary cancer conference. The possibility of  proceeding directly to radiation of the T3 lesion, starting with simple biopsy, or recommending kyphoplasty, were all considered, but the consensus was that the patient should proceed to spinal surgery as originally proposed by Dr. Hal Rosales. That would also provide tissue for further testing. Radiation would follow.  REVIEW OF SYSTEMS:  April Rosales's pain is less than before and she is taking a half a gritty powder twice a day to control it. She keeps to her normal activity level, taking care of her cats, housework, and doing a little bit of walking. She does go out to eat and to church. They have not been any unusual headaches, visual changes, nausea, vomiting, cough, phlegm production, pleurisy or rash, bleeding, or fever. She thinks she might be a little bit dehydrated. A detailed review of systems today was otherwise stable.  PAST MEDICAL HISTORY: Past Medical History  Diagnosis Date  . Labile hypertension   . Hyperlipidemia   . Prediabetes   . Vitamin D deficiency   . Osteoporosis   . IBS (irritable bowel syndrome)   . History of breast cancer     PAST SURGICAL HISTORY: Past Surgical History  Procedure Laterality Date  . Appendectomy  1977  . Abdominal hysterectomy      1980  . Thyroid cyst excision  1967  . Breast surgery Right 1985    FAMILY HISTORY Family History  Problem Relation Age of Onset  . Heart disease Mother   . Hypertension Mother   . Heart disease Father   . Diabetes Father    The patient's father died from a heart attack at the age of 70. He had 9 sisters. 3 of those sisters had breast cancer, all in a menopausal setting. Another sister had ovarian cancer. One of the paternal uncles had cancer of the colon "and back".  The patient's mother died at the age of 67. She was found to have breast cancer shortly before dying , during her final hospitalization.  GYNECOLOGIC HISTORY:  No LMP recorded.  Menarche age 38, first live birth age 50, the patient is GX P1. She  underwent hysterectomy in 1980. She thinks the ovaries were removed, but the CT scan obtained 08/24/2014 showed a definite right ovary. The left ovary may have been removed. She did not take hormone replacement after the hysterectomy.  SOCIAL HISTORY:   Desta worked in Psychiatric nurse. She is divorced, lives alone, with 2 cats. Her son April Rosales sharp lives in [place] where he works in Engineer, technical sales. He has 4 children of his own. The patient is a Psychologist, forensic.    ADVANCED DIRECTIVES:  In place. The patient has named her sister April Rosales [phone]) and her friend April Rosales) [phone]) as joint healthcare powers of attorney   HEALTH MAINTENANCE: History  Substance Use Topics  . Smoking status: Former Research scientist (life sciences)  . Smokeless tobacco: Former Systems developer    Quit date: 07/08/1963  . Alcohol Use: 0.0 oz/week    0 Standard drinks or equivalent per week     Comment: Rare     Colonoscopy: never  PAP: status post hysterectomy  Bone density: 08/14/2014 at [facility], results pending  Lipid panel:  No Known Allergies  Current Outpatient Prescriptions  Medication Sig Dispense Refill  . Ascorbic Acid (VITAMIN C PO) Take by mouth daily. Takes qod    . aspirin 325 MG tablet Take 325 mg by mouth daily.    . calcium carbonate (TUMS) 500 MG chewable tablet Chew 1 tablet by mouth as needed for indigestion or heartburn.    . cholecalciferol (VITAMIN D) 1000 UNITS tablet Take 2,000 Units by mouth 2 (two) times daily. Takes 2 tablets twice daily    . Cyanocobalamin (VITAMIN B-12 PO) Take by mouth daily. Takes every 3 days    . Iron-Vitamins (GERITOL PO) Take by mouth daily.    Marland Kitchen letrozole (FEMARA) 2.5 MG tablet Take 1 tablet (2.5 mg total) by mouth daily. 90 tablet 12   No current facility-administered medications for this visit.    OBJECTIVE:  Older white woman in no acute distress Filed Vitals:   09/13/14 1700  BP: 169/80  Pulse: 116  Temp: 98.2 F (36.8 C)  Resp: 18     Body mass index is 22.18  kg/(m^2).    ECOG FS:1 - Symptomatic but completely ambulatory  Sclerae unicteric, EOMs intact Oropharynx clear, dentition in good repair No cervical or supraclavicular adenopathy Lungs no rales or rhonchi Heart regular rate and rhythm Abd soft, nontender, positive bowel sounds MSK kyphosis no focal spinal tenderness, including around the T3 area. Straight leg raise bilaterally to 85 without discomfort Neuro: nonfocal, well oriented, appropriate affect Breasts: The right breast is status post remote lumpectomy and radiation. There is distortion of the normal breast contour as noted previously. There is nothing to suggest local recurrence. The right axilla is benign. In the left breast upper inner quadrant there is a palpable mass which is firm and movable, not associated with any skin changes, and measures 1-1/2-2 cm by palpation. There are no skin or nipple changes of concern and the left axilla is benign.   LAB RESULTS:  CMP     Component Value Date/Time   NA 142 08/31/2014 1529   NA 141 08/21/2014 1713   K 4.6 08/31/2014 1529   K 4.4 08/21/2014 1713   CL 102 08/21/2014 1713   CO2 29 08/31/2014 1529   CO2 28 08/21/2014 1713   GLUCOSE 94 08/31/2014 1529   GLUCOSE 86 08/21/2014 1713   BUN 16.4 08/31/2014 1529   BUN 13 08/21/2014 1713   CREATININE 0.7 08/31/2014 1529   CREATININE 0.59 08/21/2014 1713   CALCIUM 10.4 08/31/2014 1529   CALCIUM 9.9 08/21/2014 1713   PROT 6.8 08/31/2014 1529   PROT 6.7 08/21/2014 1713   ALBUMIN 3.8 08/31/2014 1529   ALBUMIN 4.2 08/21/2014 1713   AST 21 08/31/2014 1529   AST 18 08/21/2014 1713   ALT 15 08/31/2014 1529   ALT 13 08/21/2014 1713   ALKPHOS 56 08/31/2014 1529   ALKPHOS 53 08/21/2014 1713   BILITOT 0.29 08/31/2014 1529   BILITOT 0.4 08/21/2014 1713   GFRNONAA >89 08/21/2014 1713   GFRAA >89 08/21/2014 1713    INo results found for: SPEP, UPEP  Lab Results  Component Value Date   WBC 5.9 08/31/2014   NEUTROABS 3.7 08/31/2014     HGB 14.2 08/31/2014   HCT 41.4 08/31/2014   MCV 91.8 08/31/2014   PLT 269 08/31/2014      Chemistry      Component Value Date/Time   NA 142 08/31/2014 1529   NA 141 08/21/2014 1713   K 4.6 08/31/2014 1529   K 4.4 08/21/2014 1713   CL 102  08/21/2014 1713   CO2 29 08/31/2014 1529   CO2 28 08/21/2014 1713   BUN 16.4 08/31/2014 1529   BUN 13 08/21/2014 1713   CREATININE 0.7 08/31/2014 1529   CREATININE 0.59 08/21/2014 1713      Component Value Date/Time   CALCIUM 10.4 08/31/2014 1529   CALCIUM 9.9 08/21/2014 1713   ALKPHOS 56 08/31/2014 1529   ALKPHOS 53 08/21/2014 1713   AST 21 08/31/2014 1529   AST 18 08/21/2014 1713   ALT 15 08/31/2014 1529   ALT 13 08/21/2014 1713   BILITOT 0.29 08/31/2014 1529   BILITOT 0.4 08/21/2014 1713       No results found for: LABCA2  No components found for: LABCA125  No results for input(s): INR in the last 168 hours.  Urinalysis No results found for: COLORURINE, APPEARANCEUR, LABSPEC, PHURINE, GLUCOSEU, HGBUR, BILIRUBINUR, KETONESUR, PROTEINUR, UROBILINOGEN, NITRITE, LEUKOCYTESUR  STUDIES: Ct Chest W Contrast  08/24/2014   CLINICAL DATA:  Left scapular pain and tenderness extending to the left arm for the past 4 weeks. Remote history of breast cancer. Thoracic spine MRI concerning for malignancy at the T3 vertebral level  EXAM: CT CHEST, ABDOMEN, AND PELVIS WITH CONTRAST  TECHNIQUE: Multidetector CT imaging of the chest, abdomen and pelvis was performed following the standard protocol during bolus administration of intravenous contrast.  CONTRAST:  100 cc Isovue 300  COMPARISON:  08/16/2014  FINDINGS: CT CHEST FINDINGS  Mediastinum/Nodes: Hypodense right thyroid lesion 2.0 by 1.2 cm, image 6 series 3.  There is a dense mass in the upper inner quadrant of left breast measuring 1.6 by 1.6 cm on images 33-34 of series 3.  Lungs/Pleura: Volume loss in the right middle lobe with IA 1.2 by 0.8 cm nodule along the minor fissure, image 29 series  4. Above this level there are findings likely related to a right-sided radiation port.  Musculoskeletal: As noted on MRI there is obstructive lesion of the left T3 vertebra with a mass extending into the spinal canal, destroying the left pedicle, and extending into the left posterior elements as well as into the left T3-4 and possible T2-3 neural foramina. At T2-3 there is a fluid density lateral extraforaminal structure between the second and third ribs, compatible with lateral thoracic meningocele.  Mild sclerosis posteriorly in the left fifth and sixth ribs are at adjacent levels and may simply be posttraumatic or incidental. Chronic compression at T6. Chronic compression at T10.  CT ABDOMEN PELVIS FINDINGS  Hepatobiliary: Unremarkable  Pancreas: Unremarkable  Spleen: Unremarkable  Adrenals/Urinary Tract: Unremarkable  Stomach/Bowel: Sigmoid diverticulosis without active diverticulitis.  Vascular/Lymphatic: Unremarkable  Reproductive: Uterus absent. Right ovary unremarkable. Left ovary not well seen.  Other: No supplemental non-categorized findings.  Musculoskeletal: Compression fractures at L1, L 2, and L3, with associated mild posterior bony retropulsion but without categorical evidence of underlying malignant lesion.  IMPRESSION: 1. Destructive T3 vertebral lesion eccentric to the left with extension into the spinal canal, as noted and detailed on prior thoracic spine MRI. 2. Suspected mass in the upper medial left breast concerning for breast cancer, measuring 1.6 cm in diameter. Diagnostic mammographic workup recommended. 3. 1.2 by 0.8 cm nodule along the minor fissure could reflect a metastatic lesion. Nuclear medicine PET-CT may be part of this patient's staging workup. 4. Chronic likely benign compression fractures at T6, T10, L1, L2, and L3, compatible with osteoporosis. 5. Right thyroid nodule, 2.0 by 1.2 cm, low-density. Consider further evaluation with thyroid ultrasound. If patient is clinically  hyperthyroid, consider  nuclear medicine thyroid uptake and scan. 6. Faint sclerosis in the left fifth and sixth ribs noted at adjacent levels, accordingly probably posttraumatic/incidental rather than being due to osseous metastatic disease.   Electronically Signed   By: Van Clines M.D.   On: 08/24/2014 15:34   Mr Thoracic Spine Wo Contrast  08/17/2014   CLINICAL DATA:  Thoracic back pain. History of fractures. History breast cancer 1980s  EXAM: MRI THORACIC SPINE WITHOUT CONTRAST  TECHNIQUE: Multiplanar, multisequence MR imaging of the thoracic spine was performed. No intravenous contrast was administered.  COMPARISON:  Chest two-view 01/02/2009.  Lumbar MRI 10/09/2011  FINDINGS: Pathologic fracture of T3. There is tumor in the T3 vertebral body on the left extending into the left pedicle and lamina. This extends into the lateral epidural space and displaces the cord to the right. No cord compression. Cord signal abnormality. This is most likely secondary to metastatic disease. No other areas of tumor identified in the thoracic spine.  Mild chronic compression fractures involve T4, T6, T10, T12, L1. Scattered hemangiomata/ fatty deposits in the vertebral bodies of T8-T10 and T11-T12.  Moderately large right paracentral disc protrusion T8-9 with cord flattening. Mild lumbar degenerative changes elsewhere. Lateral thoracic meningocele certain noted at multiple levels, the largest being on the left at T2-3 measuring approximately 22 mm in size.  23 x 17 mm cyst in the right anterior neck most likely within the right lobe of the thyroid. This is incompletely evaluated on the study.  IMPRESSION: Pathologic fracture T3 vertebral body on the left compatible with metastatic disease. Epidural tumor is present displacing the cord to the right. No cord compression. No additional metastatic deposits identified.  Numerous chronic benign compression fractures. Right paracentral disc protrusion T8-9.  These results  will be called to the ordering clinician or representative by the Radiologist Assistant, and communication documented in the PACS or zVision Dashboard.   Electronically Signed   By: Franchot Gallo M.D.   On: 08/17/2014 08:20   Ct Abdomen Pelvis W Contrast  08/24/2014   CLINICAL DATA:  Left scapular pain and tenderness extending to the left arm for the past 4 weeks. Remote history of breast cancer. Thoracic spine MRI concerning for malignancy at the T3 vertebral level  EXAM: CT CHEST, ABDOMEN, AND PELVIS WITH CONTRAST  TECHNIQUE: Multidetector CT imaging of the chest, abdomen and pelvis was performed following the standard protocol during bolus administration of intravenous contrast.  CONTRAST:  100 cc Isovue 300  COMPARISON:  08/16/2014  FINDINGS: CT CHEST FINDINGS  Mediastinum/Nodes: Hypodense right thyroid lesion 2.0 by 1.2 cm, image 6 series 3.  There is a dense mass in the upper inner quadrant of left breast measuring 1.6 by 1.6 cm on images 33-34 of series 3.  Lungs/Pleura: Volume loss in the right middle lobe with IA 1.2 by 0.8 cm nodule along the minor fissure, image 29 series 4. Above this level there are findings likely related to a right-sided radiation port.  Musculoskeletal: As noted on MRI there is obstructive lesion of the left T3 vertebra with a mass extending into the spinal canal, destroying the left pedicle, and extending into the left posterior elements as well as into the left T3-4 and possible T2-3 neural foramina. At T2-3 there is a fluid density lateral extraforaminal structure between the second and third ribs, compatible with lateral thoracic meningocele.  Mild sclerosis posteriorly in the left fifth and sixth ribs are at adjacent levels and may simply be posttraumatic or incidental. Chronic compression  at T6. Chronic compression at T10.  CT ABDOMEN PELVIS FINDINGS  Hepatobiliary: Unremarkable  Pancreas: Unremarkable  Spleen: Unremarkable  Adrenals/Urinary Tract: Unremarkable   Stomach/Bowel: Sigmoid diverticulosis without active diverticulitis.  Vascular/Lymphatic: Unremarkable  Reproductive: Uterus absent. Right ovary unremarkable. Left ovary not well seen.  Other: No supplemental non-categorized findings.  Musculoskeletal: Compression fractures at L1, L 2, and L3, with associated mild posterior bony retropulsion but without categorical evidence of underlying malignant lesion.  IMPRESSION: 1. Destructive T3 vertebral lesion eccentric to the left with extension into the spinal canal, as noted and detailed on prior thoracic spine MRI. 2. Suspected mass in the upper medial left breast concerning for breast cancer, measuring 1.6 cm in diameter. Diagnostic mammographic workup recommended. 3. 1.2 by 0.8 cm nodule along the minor fissure could reflect a metastatic lesion. Nuclear medicine PET-CT may be part of this patient's staging workup. 4. Chronic likely benign compression fractures at T6, T10, L1, L2, and L3, compatible with osteoporosis. 5. Right thyroid nodule, 2.0 by 1.2 cm, low-density. Consider further evaluation with thyroid ultrasound. If patient is clinically hyperthyroid, consider nuclear medicine thyroid uptake and scan. 6. Faint sclerosis in the left fifth and sixth ribs noted at adjacent levels, accordingly probably posttraumatic/incidental rather than being due to osseous metastatic disease.   Electronically Signed   By: Van Clines M.D.   On: 08/24/2014 15:34   Mr Breast Bilateral W Wo Contrast  09/04/2014   CLINICAL DATA:  74 year old female with remote history of right breast cancer post lumpectomy in 1985. The patient had a recent CT demonstrating a destructive/metastatic upper thoracic spine lesion as well as a suspicious mass in the inner left breast; biopsy of left breast mass indicated invasive mammary carcinoma  EXAM: BILATERAL BREAST MRI WITH AND WITHOUT CONTRAST  TECHNIQUE: Multiplanar, multisequence MR images of both breasts were obtained prior to and  following the intravenous administration of 11 ml of MultiHance.  THREE-DIMENSIONAL MR IMAGE RENDERING ON INDEPENDENT WORKSTATION:  Three-dimensional MR images were rendered by post-processing of the original MR data on an independent workstation. The three-dimensional MR images were interpreted, and findings are reported in the following complete MRI report for this study. Three dimensional images were evaluated at the independent DynaCad workstation  COMPARISON:  Previous exam(s).  FINDINGS: Breast composition: c. Heterogeneous fibroglandular tissue.  Background parenchymal enhancement: Moderate.  Right breast: No mass or abnormal enhancement.  Left breast: In the posterior left breast, associated with biopsy marker clip, there is an enhancing irregular mass with irregular borders measuring 27 x 22 x 21 mm.  Lymph nodes: Although there are no enlarged lymph nodes, on the left there are 2 briskly enhancing lymph nodes in the far posterior superior 12 o'clock position of the left breast showing cortical thickening. Further inferiorly in the axillary tail there is a similar 1 cm lymph node. There are numerous smaller axillary and axillary tail lymph nodes showing similar enhancement.  Ancillary findings: There is an 18 mm mass in the middle lobe of the right lung as described on report of CT scan performed 07/27/2014. This could represent metastasis.  The study is significantly limited by patient motion. Due to pain, the patient was unable to remain still. This decreases overall sensitivity.  IMPRESSION: Moderately limited study due to patient motion. Known invasive mammary carcinoma upper inner quadrant left breast. Numerous intramammary and axillary tail lymph nodes which are not enlarged but which appear somewhat suspicious for possible metastasis nonetheless.  Known right lung mass which could represent metastasis.  As suggested in report of prior CT scan, PET scan suggested for further evaluation.   RECOMMENDATION: Treatment planning for known invasive mammary carcinoma on the left.  BI-RADS CATEGORY  6: Known biopsy-proven malignancy.   Electronically Signed   By: Skipper Cliche M.D.   On: 09/04/2014 12:09   Mm Digital Diagnostic Unilat L  08/30/2014   ADDENDUM REPORT: 08/30/2014 14:38  ADDENDUM: Pathology results: Pathology results from the ultrasound-guided biopsy of the mass in the left breast at 10 o'clock revealed invasive mammary carcinoma. This is concordant with the imaging findings. The patient has been notified of the results. She is doing well and denies any biopsy site complications.  She has an appointment with Dr. Dalbert Batman 09/07/2014 at 11:15 a.m. and is also scheduled to see Dr. Jana Hakim tomorrow 08/31/2014. A breast MRI is scheduled for 09/04/2014 at 9:30 a.m.  The patient has been instructed to call the [facility] with any questions or concerns.   Electronically Signed   By: Everlean Alstrom M.D.   On: 08/30/2014 14:38   08/30/2014   CLINICAL DATA:  Post ultrasound-guided biopsy of a highly suspicious mass in the upper inner left breast at 10 o'clock.  EXAM: DIAGNOSTIC LEFT MAMMOGRAM POST ULTRASOUND BIOPSY  COMPARISON:  Previous exams  FINDINGS: Mammographic images were obtained following ultrasound guided biopsy of the mass in the upper inner left breast at 10 o'clock. A ribbon shaped biopsy marking clip is present in the targeted region of the mass.  IMPRESSION: Appropriate position of ribbon shaped biopsy marking clip post biopsy of a highly suspicious mass in the left breast at 10 o'clock.  Final Assessment: Post Procedure Mammograms for Marker Placement  Electronically Signed: By: Everlean Alstrom M.D. On: 08/29/2014 11:43   US Breast Ltd Uni Left Inc Axilla  08/31/2014   ADDENDUM REPORT: 08/31/2014 14:09  ADDENDUM: Number 2 of the impression should read as follows: No mammographic evidence of malignancy in the RIGHT breast.   Electronically Signed   By: Claudie Revering M.D.   On:  08/31/2014 14:09   08/31/2014   CLINICAL DATA:  74 year old female with remote history of right breast cancer post lumpectomy in 1985. The patient had a recent CT demonstrating a destructive/metastatic upper thoracic spine lesion as well as a suspicious mass in the inner left breast.  EXAM: DIGITAL DIAGNOSTIC BILATERAL MAMMOGRAM WITH 3D TOMOSYNTHESIS AND CAD  LEFT BREAST ULTRASOUND  COMPARISON:  Previous exams.  ACR Breast Density Category c: The breast tissue is heterogeneously dense, which may obscure small masses.  FINDINGS: No suspicious masses or calcifications are seen in the right breast. Postsurgical changes are present in the far upper right breast with mild distortion related to prior lumpectomy. The CC and MLO tomosynthesis of the left breast as well as the spot compression tomosynthesis demonstrates irregular mass in the inner left breast measuring approximately 2.3 cm. This is at the site of palpable concern and appears to correspond with recent CT findings.  Mammographic images were processed with CAD.  Physical examination at site of palpable concern in the inner slightly upper left breast reveals a firm mass.  Targeted ultrasound of the left breast at site of palpable concern was performed demonstrating an irregular hypoechoic mass at 10 o'clock 5 cm from the nipple measuring approximately 2 x 1.2 x 1.3 cm. Adjacent areas of nodularity connect to the dominant mass. This corresponds with mammography and CT findings. No definite lymphadenopathy seen in the left axilla.  IMPRESSION: 1. Highly suspicious left breast  mass.  2.  No mammographic evidence of malignancy in the left breast.  RECOMMENDATION: Ultrasound-guided biopsy of the suspicious mass in the upper inner left breast is recommended. This will subsequently be performed and dictated separately.  I have discussed the findings and recommendations with the patient. Results were also provided in writing at the conclusion of the visit. If  applicable, a reminder letter will be sent to the patient regarding the next appointment.  BI-RADS CATEGORY  5: Highly suggestive of malignancy.  Electronically Signed: By: Everlean Alstrom M.D. On: 08/29/2014 11:02   Mm Diag Breast Tomo Bilateral  08/31/2014   ADDENDUM REPORT: 08/31/2014 14:09  ADDENDUM: Number 2 of the impression should read as follows: No mammographic evidence of malignancy in the RIGHT breast.   Electronically Signed   By: Claudie Revering M.D.   On: 08/31/2014 14:09   08/31/2014   CLINICAL DATA:  74 year old female with remote history of right breast cancer post lumpectomy in 1985. The patient had a recent CT demonstrating a destructive/metastatic upper thoracic spine lesion as well as a suspicious mass in the inner left breast.  EXAM: DIGITAL DIAGNOSTIC BILATERAL MAMMOGRAM WITH 3D TOMOSYNTHESIS AND CAD  LEFT BREAST ULTRASOUND  COMPARISON:  Previous exams.  ACR Breast Density Category c: The breast tissue is heterogeneously dense, which may obscure small masses.  FINDINGS: No suspicious masses or calcifications are seen in the right breast. Postsurgical changes are present in the far upper right breast with mild distortion related to prior lumpectomy. The CC and MLO tomosynthesis of the left breast as well as the spot compression tomosynthesis demonstrates irregular mass in the inner left breast measuring approximately 2.3 cm. This is at the site of palpable concern and appears to correspond with recent CT findings.  Mammographic images were processed with CAD.  Physical examination at site of palpable concern in the inner slightly upper left breast reveals a firm mass.  Targeted ultrasound of the left breast at site of palpable concern was performed demonstrating an irregular hypoechoic mass at 10 o'clock 5 cm from the nipple measuring approximately 2 x 1.2 x 1.3 cm. Adjacent areas of nodularity connect to the dominant mass. This corresponds with mammography and CT findings. No definite  lymphadenopathy seen in the left axilla.  IMPRESSION: 1. Highly suspicious left breast mass.  2.  No mammographic evidence of malignancy in the left breast.  RECOMMENDATION: Ultrasound-guided biopsy of the suspicious mass in the upper inner left breast is recommended. This will subsequently be performed and dictated separately.  I have discussed the findings and recommendations with the patient. Results were also provided in writing at the conclusion of the visit. If applicable, a reminder letter will be sent to the patient regarding the next appointment.  BI-RADS CATEGORY  5: Highly suggestive of malignancy.  Electronically Signed: By: Everlean Alstrom M.D. On: 08/29/2014 11:02   [place] Lt Breast Bx W Loc Dev 1st Lesion Img Bx Spec US Guide  08/29/2014   CLINICAL DATA:  Highly suspicious mass in the upper inner left breast.  EXAM: ULTRASOUND GUIDED LEFT BREAST CORE NEEDLE BIOPSY  COMPARISON:  Previous exam(s).  PROCEDURE: I met with the patient and we discussed the procedure of ultrasound-guided biopsy, including benefits and alternatives. We discussed the high likelihood of a successful procedure. We discussed the risks of the procedure including infection, bleeding, tissue injury, clip migration, and inadequate sampling. Informed written consent was given. The usual time-out protocol was performed immediately prior to the procedure.  Using  sterile technique and 2% Lidocaine as local anesthetic, under direct ultrasound visualization, a 12 gauge vacuum-assisted device was used to perform biopsy of the mass in the upper inner left breast at 10 o'clockusing a medial approach. At the conclusion of the procedure, a ribbon shaped tissue marker clip was deployed into the biopsy cavity. Follow-up 2-view mammogram was performed and dictated separately.  IMPRESSION: Ultrasound-guided biopsy of the suspicious mass in the upper inner left breast at 10 o'clock. No apparent complications.   Electronically Signed   By: Everlean Alstrom M.D.   On: 08/29/2014 11:03    ASSESSMENT: 74 y.o. 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 CT scans of the chest, abdomen and pelvis 08/24/2014 showed in addition a mass in the upper left breast measuring 1.6 cm and a nodule in the minor fissure of the right lung measuring 1.2 cm, but no parenchymal lung or liver lesions    (3) mammography and ultrasonography 08/29/2014 show a mass in the upper inner left breast which was palpable,  measuring 2.0 cm by ultrasound. Biopsy of this mass 08/29/2014 showed an invasive breast cancer with both lobular and ductal features, estrogen receptor positive, progesterone receptor weakly positive, with an MIB-1 in the 40% range, HER-2 pending,   (4) letrozole started 08/31/2014; palbociclib to follow   (5)  zolendronate to start 09/20/2014,  to be repeated every 12 weeks   (6)  spinal surgery to the T3 area pending, with adjuvant radiation to follow    (7)  to consider eventual left lumpectomy or mastectomy depending on  longer-term results of systemic therapy  PLAN: Reizy is tolerating the letrozole well and she is obtaining it at a very good price. That begins her systemic treatment. She is also scheduled for some zolendronate 09/20/2014 and we again discussed the possible toxicities, side effects and complications of that agent.  Her T3 lesion was debated extensively at the brain and spinal conference and the decision made they are affirmed the plan originally proposed by Dr. Hal Rosales. Accordingly the next step there will be 4 her to meet with him and have the surgery. Once she heals from that she will undergo  radiation to the T3 area under the direction of Dr. Pablo Ledger.  Of course surgery to T3 will get [place] some tissue so we can demonstrate whether the cancer there is indeed a metastatic deposit from her breast as we expect her something completely different.  She also has an 8 mm lesion in her right upper lung. This needs only observation for now. If everything shrinks on the letrozole and that gross it would need to be biopsied.  Airen and her family have a very good understanding of this plan. They agree with it. They know the goal of treatment in her case is control. They will call with any problem that may develop before her next visit here.   April Cruel, MD   09/13/2014 5:19 PM Medical Oncology and Hematology [facility] [address] [place], [place] [address] Tel. [phone]    Fax. [phone]

## 2014-09-14 ENCOUNTER — Telehealth: Payer: Self-pay | Admitting: Oncology

## 2014-09-14 NOTE — Telephone Encounter (Signed)
per pof to sch pt appt-cld & spoke to pt and gave tp time & date of appt

## 2014-09-19 DIAGNOSIS — M546 Pain in thoracic spine: Secondary | ICD-10-CM | POA: Diagnosis not present

## 2014-09-20 ENCOUNTER — Other Ambulatory Visit (HOSPITAL_BASED_OUTPATIENT_CLINIC_OR_DEPARTMENT_OTHER): Payer: Medicare Other

## 2014-09-20 ENCOUNTER — Ambulatory Visit (HOSPITAL_BASED_OUTPATIENT_CLINIC_OR_DEPARTMENT_OTHER): Payer: Medicare Other

## 2014-09-20 ENCOUNTER — Ambulatory Visit (HOSPITAL_BASED_OUTPATIENT_CLINIC_OR_DEPARTMENT_OTHER): Payer: Medicare Other | Admitting: Genetic Counselor

## 2014-09-20 ENCOUNTER — Encounter: Payer: Self-pay | Admitting: Genetic Counselor

## 2014-09-20 ENCOUNTER — Telehealth: Payer: Self-pay | Admitting: *Deleted

## 2014-09-20 VITALS — BP 145/68 | HR 112 | Temp 98.4°F | Resp 18

## 2014-09-20 DIAGNOSIS — C50912 Malignant neoplasm of unspecified site of left female breast: Secondary | ICD-10-CM

## 2014-09-20 DIAGNOSIS — C800 Disseminated malignant neoplasm, unspecified: Secondary | ICD-10-CM | POA: Diagnosis not present

## 2014-09-20 DIAGNOSIS — M810 Age-related osteoporosis without current pathological fracture: Secondary | ICD-10-CM

## 2014-09-20 DIAGNOSIS — C50212 Malignant neoplasm of upper-inner quadrant of left female breast: Secondary | ICD-10-CM

## 2014-09-20 DIAGNOSIS — Z803 Family history of malignant neoplasm of breast: Secondary | ICD-10-CM

## 2014-09-20 DIAGNOSIS — C50919 Malignant neoplasm of unspecified site of unspecified female breast: Secondary | ICD-10-CM

## 2014-09-20 DIAGNOSIS — Z853 Personal history of malignant neoplasm of breast: Secondary | ICD-10-CM

## 2014-09-20 DIAGNOSIS — Z8041 Family history of malignant neoplasm of ovary: Secondary | ICD-10-CM | POA: Diagnosis not present

## 2014-09-20 DIAGNOSIS — C7951 Secondary malignant neoplasm of bone: Secondary | ICD-10-CM

## 2014-09-20 DIAGNOSIS — C799 Secondary malignant neoplasm of unspecified site: Secondary | ICD-10-CM | POA: Diagnosis not present

## 2014-09-20 DIAGNOSIS — Z8042 Family history of malignant neoplasm of prostate: Secondary | ICD-10-CM

## 2014-09-20 DIAGNOSIS — Z315 Encounter for genetic counseling: Secondary | ICD-10-CM

## 2014-09-20 LAB — CBC WITH DIFFERENTIAL/PLATELET
BASO%: 0.6 % (ref 0.0–2.0)
Basophils Absolute: 0 10*3/uL (ref 0.0–0.1)
EOS ABS: 0.2 10*3/uL (ref 0.0–0.5)
EOS%: 3.9 % (ref 0.0–7.0)
HCT: 42 % (ref 34.8–46.6)
HGB: 14.2 g/dL (ref 11.6–15.9)
LYMPH#: 1.4 10*3/uL (ref 0.9–3.3)
LYMPH%: 28 % (ref 14.0–49.7)
MCH: 31.2 pg (ref 25.1–34.0)
MCHC: 33.8 g/dL (ref 31.5–36.0)
MCV: 92.3 fL (ref 79.5–101.0)
MONO#: 0.5 10*3/uL (ref 0.1–0.9)
MONO%: 9.3 % (ref 0.0–14.0)
NEUT#: 2.9 10*3/uL (ref 1.5–6.5)
NEUT%: 58.2 % (ref 38.4–76.8)
Platelets: 275 10*3/uL (ref 145–400)
RBC: 4.55 10*6/uL (ref 3.70–5.45)
RDW: 13.7 % (ref 11.2–14.5)
WBC: 4.9 10*3/uL (ref 3.9–10.3)

## 2014-09-20 LAB — COMPREHENSIVE METABOLIC PANEL (CC13)
ALBUMIN: 3.6 g/dL (ref 3.5–5.0)
ALT: 16 U/L (ref 0–55)
ANION GAP: 10 meq/L (ref 3–11)
AST: 20 U/L (ref 5–34)
Alkaline Phosphatase: 58 U/L (ref 40–150)
BILIRUBIN TOTAL: 0.26 mg/dL (ref 0.20–1.20)
BUN: 9.4 mg/dL (ref 7.0–26.0)
CO2: 25 meq/L (ref 22–29)
Calcium: 10.1 mg/dL (ref 8.4–10.4)
Chloride: 105 mEq/L (ref 98–109)
Creatinine: 0.6 mg/dL (ref 0.6–1.1)
EGFR: 87 mL/min/{1.73_m2} — AB (ref >90)
GLUCOSE: 98 mg/dL (ref 70–140)
POTASSIUM: 3.8 meq/L (ref 3.5–5.1)
SODIUM: 141 meq/L (ref 136–145)
TOTAL PROTEIN: 7 g/dL (ref 6.4–8.3)

## 2014-09-20 LAB — CANCER ANTIGEN 27.29: CA 27.29: 38 U/mL (ref 0–39)

## 2014-09-20 MED ORDER — ZOLEDRONIC ACID 4 MG/5ML IV CONC
3.5000 mg | Freq: Once | INTRAVENOUS | Status: AC
  Administered 2014-09-20: 3.5 mg via INTRAVENOUS
  Filled 2014-09-20: qty 4.38

## 2014-09-20 MED ORDER — SODIUM CHLORIDE 0.9 % IV SOLN
Freq: Once | INTRAVENOUS | Status: AC
  Administered 2014-09-20: 13:00:00 via INTRAVENOUS

## 2014-09-20 NOTE — Telephone Encounter (Signed)
Patient called with "questions about today's Reclast appointment at 12:30.  How is this given?  How does it work?  What are the side effects?  How often is it given?  How long will I have to wait before I can have surgery?" Answered questions.  Further questions of "how long the Reclast is in her body before passed through kidneys and how long it remains in her system".  These are pharmacist questions I advised she ask infusion nurse/ pharmacist when she arrives as they will have access to the half life .  Would also like to know why her lab appointment is after the treatment.  Informed her this is due to Genetics appointment.

## 2014-09-20 NOTE — Progress Notes (Signed)
Ok to treat patient with Zometa without dental clearance per Dr. Jana Hakim.

## 2014-09-20 NOTE — Patient Instructions (Signed)
Zoledronic Acid injection (Hypercalcemia, Oncology) (Zometa) What is this medicine? ZOLEDRONIC ACID (ZOE le dron ik AS id) lowers the amount of calcium loss from bone. It is used to treat too much calcium in your blood from cancer. It is also used to prevent complications of cancer that has spread to the bone. This medicine may be used for other purposes; ask your health care provider or pharmacist if you have questions. COMMON BRAND NAME(S): Zometa What should I tell my health care provider before I take this medicine? They need to know if you have any of these conditions: -aspirin-sensitive asthma -cancer, especially if you are receiving medicines used to treat cancer -dental disease or wear dentures -infection -kidney disease -receiving corticosteroids like dexamethasone or prednisone -an unusual or allergic reaction to zoledronic acid, other medicines, foods, dyes, or preservatives -pregnant or trying to get pregnant -breast-feeding How should I use this medicine? This medicine is for infusion into a vein. It is given by a health care professional in a hospital or clinic setting. Talk to your pediatrician regarding the use of this medicine in children. Special care may be needed. Overdosage: If you think you have taken too much of this medicine contact a poison control center or emergency room at once. NOTE: This medicine is only for you. Do not share this medicine with others. What if I miss a dose? It is important not to miss your dose. Call your doctor or health care professional if you are unable to keep an appointment. What may interact with this medicine? -certain antibiotics given by injection -NSAIDs, medicines for pain and inflammation, like ibuprofen or naproxen -some diuretics like bumetanide, furosemide -teriparatide -thalidomide This list may not describe all possible interactions. Give your health care provider a list of all the medicines, herbs, non-prescription drugs,  or dietary supplements you use. Also tell them if you smoke, drink alcohol, or use illegal drugs. Some items may interact with your medicine. What should I watch for while using this medicine? Visit your doctor or health care professional for regular checkups. It may be some time before you see the benefit from this medicine. Do not stop taking your medicine unless your doctor tells you to. Your doctor may order blood tests or other tests to see how you are doing. Women should inform their doctor if they wish to become pregnant or think they might be pregnant. There is a potential for serious side effects to an unborn child. Talk to your health care professional or pharmacist for more information. You should make sure that you get enough calcium and vitamin D while you are taking this medicine. Discuss the foods you eat and the vitamins you take with your health care professional. Some people who take this medicine have severe bone, joint, and/or muscle pain. This medicine may also increase your risk for jaw problems or a broken thigh bone. Tell your doctor right away if you have severe pain in your jaw, bones, joints, or muscles. Tell your doctor if you have any pain that does not go away or that gets worse. Tell your dentist and dental surgeon that you are taking this medicine. You should not have major dental surgery while on this medicine. See your dentist to have a dental exam and fix any dental problems before starting this medicine. Take good care of your teeth while on this medicine. Make sure you see your dentist for regular follow-up appointments. What side effects may I notice from receiving this medicine? Side effects   that you should report to your doctor or health care professional as soon as possible: -allergic reactions like skin rash, itching or hives, swelling of the face, lips, or tongue -anxiety, confusion, or depression -breathing problems -changes in vision -eye pain -feeling faint  or lightheaded, falls -jaw pain, especially after dental work -mouth sores -muscle cramps, stiffness, or weakness -trouble passing urine or change in the amount of urine Side effects that usually do not require medical attention (report to your doctor or health care professional if they continue or are bothersome): -bone, joint, or muscle pain -constipation -diarrhea -fever -hair loss -irritation at site where injected -loss of appetite -nausea, vomiting -stomach upset -trouble sleeping -trouble swallowing -weak or tired This list may not describe all possible side effects. Call your doctor for medical advice about side effects. You may report side effects to FDA at 1-800-FDA-1088. Where should I keep my medicine? This drug is given in a hospital or clinic and will not be stored at home. NOTE: This sheet is a summary. It may not cover all possible information. If you have questions about this medicine, talk to your doctor, pharmacist, or health care provider.  2015, Elsevier/Gold Standard. (2012-10-21 13:03:13)  

## 2014-09-20 NOTE — Progress Notes (Signed)
REFERRING PROVIDER: [name], MD [place], [place] [address]   April Del, MD  PRIMARY PROVIDER:  [name], MD  PRIMARY REASON FOR VISIT:  1. Breast cancer metastasized to multiple sites, unspecified laterality   2. History of breast cancer   3. Family history of breast cancer   4. Family history of ovarian cancer   5. Family history of prostate cancer   6. Breast cancer, left      HISTORY OF PRESENT ILLNESS:   April Rosales, a 74 y.o. female, was seen for a [place] cancer genetics consultation at the request of Dr. Melford Aase due to a personal and family history of cancer.  April Rosales presents to clinic today to discuss the possibility of a hereditary predisposition to cancer, genetic testing, and to further clarify her future cancer risks, as well as potential cancer risks for family members.   In 75, at the age of 74, April Rosales was diagnosed with stage 1 of the right breast cancer. This was treated with lumpectomy and radiation.  In 2016, at hte age of 74, April Rosales was diagnosed with left sided stage IV breast cancer.  No genetic testing has been performed.   CANCER HISTORY:   No history exists.     HORMONAL RISK FACTORS:  Menarche was at age 67.  First live birth at age 31.  OCP use for approximately 0 years.  Ovaries intact: unsure, thought they were removed but recent u/s saw right ovary.  Hysterectomy: yes.  Menopausal status: postmenopausal.  HRT use: 0 years. Colonoscopy: yes; normal. Mammogram within the last year: yes. Number of breast biopsies: 0. Up to date with pelvic exams:  yes. Any excessive radiation exposure in the past:  yes, previous treatment of breast cancer.  Past Medical History  Diagnosis Date  . Labile hypertension   . Hyperlipidemia   . Prediabetes   . Vitamin D deficiency   . Osteoporosis   . IBS (irritable bowel syndrome)   . History of breast cancer   . Breast cancer 1985/2016   [facility]  . Family history of breast cancer   . Family history of ovarian cancer     Past Surgical History  Procedure Laterality Date  . Appendectomy  1977  . Abdominal hysterectomy      1980  . Thyroid cyst excision  1967  . Breast surgery Right 1985    History   Social History  . Marital Status: Divorced    Spouse Name: N/A  . Number of Children: 1  . Years of Education: N/A   Social History Main Topics  . Smoking status: Former Smoker -- 0.50 packs/day for 10 years    Types: Cigarettes    Quit date: 09/19/1969  . Smokeless tobacco: Former Systems developer    Quit date: 07/08/1963  . Alcohol Use: 0.0 oz/week    0 Standard drinks or equivalent per week     Comment: Rare  . Drug Use: No  . Sexual Activity: Not on file   Other Topics Concern  . None   Social History Narrative     FAMILY HISTORY:  We obtained a detailed, 4-generation family history.  Significant diagnoses are listed below: Family History  Problem Relation Age of Onset  . Heart disease Mother   . Hypertension Mother   . Heart disease Father   . Diabetes Father   . Breast cancer Paternal Aunt     4 paternal aunts with breast cancer over 35  .  Prostate cancer Paternal Uncle   . Stroke Paternal Grandfather   . Ovarian cancer Paternal Aunt   . Huntington's disease Other     Nephew, inherited from his father   The patient was diagnosed with breast cancer at age 29 in 40, and again at 41.  SHe has two sisters who are cancer free.  Her father died of a heart attack.  He had nine sisters.  Four had breast cancer, one had ovarian cancer, his only brother had prostate cancer.  No other cancers on either side of hte family were reported. Patient's maternal ancestors are of Caucasian descent, and paternal ancestors are of [place] and [place] descent. There is no reported Ashkenazi Jewish ancestry. There is no known consanguinity.  GENETIC COUNSELING ASSESSMENT: [name] is a 74 y.o. female with a personal and family  history of cancer which somewhat suggestive of a hereditary cancer syndrome and predisposition to cancer. We, therefore, discussed and recommended the following at today's visit.   DISCUSSION: We reviewed the characteristics, features and inheritance patterns of hereditary cancer syndromes. We also discussed genetic testing, including the appropriate family members to test, the process of testing, insurance coverage and turn-around-time for results. We discussed the implications of a negative, positive and/or variant of uncertain significant result. We recommended April Rosales pursue genetic testing for the Breast/Ovarian cancer gene panel. The Breast/Ovarian gene panel offered by [facility] includes sequencing and rearrangement analysis for the following 21 genes:  ATM, BARD1, BRCA1, BRCA2, BRIP1, CDH1, CHEK2, EPCAM, FANCC, MLH1, MSH2, MSH6, NBN, PALB2, PMS2, PTEN, RAD51C, RAD51D, STK11, TP53, and XRCC2.     PLAN: After considering the risks, benefits, and limitations, April Rosales  provided informed consent to pursue genetic testing and the blood sample was sent to [facility] for analysis of the Breast/Ovarian cancer genes. Results should be available within approximately 2-3 weeks' time, at which point they will be disclosed by telephone to April Rosales, as will any additional recommendations warranted by these results. April Rosales will receive a summary of her genetic counseling visit and a copy of her results once available. This information will also be available in Epic. We encouraged April Rosales to remain in contact with cancer genetics annually so that we can continuously update the family history and inform her of any changes in cancer genetics and testing that may be of benefit for her family. April Rosales questions were answered to her satisfaction today. Our contact information was provided should additional questions or concerns arise.  Lastly, we encouraged April Rosales to remain in contact with  cancer genetics annually so that we can continuously update the family history and inform her of any changes in cancer genetics and testing that may be of benefit for this family.   Ms.  Rosales questions were answered to her satisfaction today. Our contact information was provided should additional questions or concerns arise. Thank you for the referral and allowing [place] to share in the care of your patient.   April Rosales, [place], [facility] Certified Genetic Counselor [email or web address] phone: [phone]  The patient was seen for a total of 60 minutes in face-to-face genetic counseling.  This patient was discussed with Drs. Magrinat, Lindi Adie and/or Burr Medico who agrees with the above.    _______________________________________________________________________ For Office Staff:  Number of people involved in session: 2 Was an Intern/ student involved with case: no

## 2014-09-21 DIAGNOSIS — C799 Secondary malignant neoplasm of unspecified site: Secondary | ICD-10-CM | POA: Diagnosis not present

## 2014-09-21 DIAGNOSIS — M546 Pain in thoracic spine: Secondary | ICD-10-CM | POA: Diagnosis not present

## 2014-09-21 DIAGNOSIS — Z803 Family history of malignant neoplasm of breast: Secondary | ICD-10-CM | POA: Diagnosis not present

## 2014-09-21 DIAGNOSIS — C7951 Secondary malignant neoplasm of bone: Secondary | ICD-10-CM | POA: Diagnosis not present

## 2014-09-21 DIAGNOSIS — C50919 Malignant neoplasm of unspecified site of unspecified female breast: Secondary | ICD-10-CM | POA: Diagnosis not present

## 2014-09-21 DIAGNOSIS — Z853 Personal history of malignant neoplasm of breast: Secondary | ICD-10-CM | POA: Diagnosis not present

## 2014-10-02 ENCOUNTER — Other Ambulatory Visit: Payer: Self-pay | Admitting: Neurosurgery

## 2014-10-02 DIAGNOSIS — M546 Pain in thoracic spine: Secondary | ICD-10-CM | POA: Diagnosis not present

## 2014-10-04 ENCOUNTER — Telehealth: Payer: Self-pay | Admitting: Oncology

## 2014-10-04 NOTE — Telephone Encounter (Signed)
Returned Advertising account executive. Unable to reach patient to returned voicemail.

## 2014-10-04 NOTE — Telephone Encounter (Signed)
Confirmed appointmentment change to June.

## 2014-10-06 ENCOUNTER — Ambulatory Visit: Payer: Medicare Other | Admitting: Oncology

## 2014-10-06 ENCOUNTER — Other Ambulatory Visit: Payer: Medicare Other

## 2014-10-13 ENCOUNTER — Encounter (HOSPITAL_COMMUNITY): Payer: Self-pay

## 2014-10-13 ENCOUNTER — Encounter (HOSPITAL_COMMUNITY)
Admission: RE | Admit: 2014-10-13 | Discharge: 2014-10-13 | Disposition: A | Discharge status: Home / Self Care | Payer: Medicare Other | Source: Ambulatory Visit | Attending: Neurosurgery | Admitting: Neurosurgery

## 2014-10-13 HISTORY — DX: Unspecified osteoarthritis, unspecified site: M19.90

## 2014-10-13 HISTORY — DX: Dorsalgia, unspecified: M54.9

## 2014-10-13 HISTORY — DX: Other chronic pain: G89.29

## 2014-10-13 LAB — CBC
HEMATOCRIT: 43.7 % (ref 36.0–46.0)
HEMOGLOBIN: 14.8 g/dL (ref 12.0–15.0)
MCH: 30.5 pg (ref 26.0–34.0)
MCHC: 33.9 g/dL (ref 30.0–36.0)
MCV: 90.1 fL (ref 78.0–100.0)
Platelets: 236 10*3/uL (ref 150–400)
RBC: 4.85 MIL/uL (ref 3.87–5.11)
RDW: 13.4 % (ref 11.5–15.5)
WBC: 4.6 10*3/uL (ref 4.0–10.5)

## 2014-10-13 LAB — BASIC METABOLIC PANEL
Anion gap: 8 (ref 5–15)
BUN: 10 mg/dL (ref 6–20)
CO2: 29 mmol/L (ref 22–32)
Calcium: 10.5 mg/dL — ABNORMAL HIGH (ref 8.9–10.3)
Chloride: 103 mmol/L (ref 101–111)
Creatinine, Ser: 0.56 mg/dL (ref 0.44–1.00)
GFR calc Af Amer: >60 mL/min (ref >60)
Glucose, Bld: 104 mg/dL — ABNORMAL HIGH (ref 65–99)
Potassium: 4.6 mmol/L (ref 3.5–5.1)
Sodium: 140 mmol/L (ref 135–145)

## 2014-10-13 LAB — SURGICAL PCR SCREEN
MRSA, PCR: NEGATIVE
Staphylococcus aureus: NEGATIVE

## 2014-10-13 NOTE — Progress Notes (Signed)
CT showed spot on lung but will address after back surgery

## 2014-10-13 NOTE — Progress Notes (Signed)
Pt doesn't have a cardiologist  Denies ever having an echo/stress test/heart cath  EKG in epic from 08-28-14  Medical Md is Dr.William MCKeown  Denies CXR in past yr

## 2014-10-13 NOTE — Progress Notes (Addendum)
Anesthesia Chart Review: Patient is a 74 year old female scheduled for laminectomy T1-3 with fixation, C7-T5 with arrow protocol on 10/20/14 by Dr. Hal Neer. She has a history of right breast cancer in 1985. In 07/2014 she was evaluated for left scapular pain and was found to have multiple compression fractures (similar to 2013) but evidence of a new pathologic fracture T3 vertebral body compatible with metastatic disease and an epidural tumor displacing the cord to the right with no cord compression. CT showed a left breast mass, right lung nodule and right thyroid nodule. Left breast mass biopsy showed invasive ductal carcinoma. Her case was presented at the multidisciplinary cancer conference and a consensus was made for her to proceed with back surgery. Tissue would be obtained at that time to confirm malignant source (ie, breast versus lung). Following recovery she may need radiation to T3 area and consideration of whether she will eventually need breast or lung surgeries.  History includes former smoker, stage IV breast cancer (origionally diagnosed '85 at [facility] s/p right lumpectomy, LN dissection and radiation), osteoporosis, appendectomy, hysterectomy. PCP is Dr. Anitra Lauth, last visit 08/28/14 for a CPE. HEM-ONC is Dr. Jana Hakim.  RAD-ONC is Dr. Pablo Ledger.  Meds include ASA (on hold), Femara.  08/28/14 EKG: NSR, possible inferior infarct (age undetermined). QRS voltage appears lower in precordial leads when compared to her 07/08/13 EKG.  Inferior lead appear stable when compared to her 07/08/13 EKG.  Both EKGs were already viewed by Dr. Anitra Lauth with no new testing recommended at the time of her visits. No CV symptoms reports then or documented from her PAT appointments.   08/24/14 Chest CT with contrast: IMPRESSION: 1. Destructive T3 vertebral lesion eccentric to the left with extension into the spinal canal, as noted and detailed on prior thoracic spine MRI. 2. Suspected mass in the upper medial left breast  concerning for breast cancer, measuring 1.6 cm in diameter. Diagnostic mammographic workup recommended. 3. 1.2 by 0.8 cm nodule along the minor fissure could reflect a metastatic lesion. Nuclear medicine PET-CT may be part of this patient's staging workup. 4. Chronic likely benign compression fractures at T6, T10, L1, L2, and L3, compatible with osteoporosis. 5. Right thyroid nodule, 2.0 by 1.2 cm, low-density. Consider further evaluation with thyroid ultrasound. If patient is clinically hyperthyroid, consider nuclear medicine thyroid uptake and scan. 6. Faint sclerosis in the left fifth and sixth ribs noted at adjacent levels, accordingly probably posttraumatic/incidental rather than being due to osseous metastatic disease.  Preoperative labs noted. A1C 5.4 and TSH 1.288 on 08/28/14.  Patient with stage IV breast cancer and now with T3 pathologic fracture and needs back surgery. She had recent CPE with Dr. Anitra Lauth with EKG and labs. No new testing ordered. If no acute changes then I would anticipate that she could proceed as planned.  George Hugh [facility] Short Stay Center/Anesthesiology Phone [phone] 10/16/2014 2:37 PM

## 2014-10-13 NOTE — Pre-Procedure Instructions (Signed)
April Rosales  10/13/2014      [facility] - [place], [place] - [address] [place] [address] Phone: [phone] Fax: [place] [address] - [place], [address] AT [facility] OF [address] [place] [address] Phone: [phone] Fax: [phone]    Your procedure is scheduled on Fri, May 27 @ 7:30 AM  Report to [facility]  at 5:30 AM  Call this number if you have problems the morning of surgery:  [phone]   Remember:  Do not eat food or drink liquids after midnight.               Stop taking your Aspirin. No Goody's,BC's,Aleve,Ibuprofen,Fish Oil,or any Herbal Medications.    Do not wear jewelry, make-up or nail polish.  Do not wear lotions, powders, or perfumes.  You may wear deodorant.  Do not shave 48 hours prior to surgery.   Do not bring valuables to the hospital.  [facility] is not responsible for any belongings or valuables.  Contacts, dentures or bridgework may not be worn into surgery.  Leave your suitcase in the car.  After surgery it may be brought to your room.  For patients admitted to the hospital, discharge time will be determined by your treatment team.  Patients discharged the day of surgery will not be allowed to drive home.    Special instructions:   April Rosales - Preparing for Surgery  Before surgery, you can play an important role.  Because skin is not sterile, your skin needs to be as free of germs as possible.  You can reduce the number of germs on you skin by washing with CHG (chlorahexidine gluconate) soap before surgery.  CHG is an antiseptic cleaner which kills germs and bonds with the skin to continue killing germs even after washing.  Please DO NOT use if you have an allergy to CHG or antibacterial soaps.  If your skin becomes reddened/irritated stop using the CHG and inform your nurse when you arrive at  Short Stay.  Do not shave (including legs and underarms) for at least 48 hours prior to the first CHG shower.  You may shave your face.  Please follow these instructions carefully:   1.  Shower with CHG Soap the night before surgery and the                                morning of Surgery.  2.  If you choose to wash your hair, wash your hair first as usual with your       normal shampoo.  3.  After you shampoo, rinse your hair and body thoroughly to remove the                      Shampoo.  4.  Use CHG as you would any other liquid soap.  You can apply chg directly       to the skin and wash gently with scrungie or a clean washcloth.  5.  Apply the CHG Soap to your body ONLY FROM THE NECK DOWN.        Do not use on open wounds or open sores.  Avoid contact with your eyes,       ears, mouth and genitals (private parts).  Wash genitals (private parts)  with your normal soap.  6.  Wash thoroughly, paying special attention to the area where your surgery        will be performed.  7.  Thoroughly rinse your body with warm water from the neck down.  8.  DO NOT shower/wash with your normal soap after using and rinsing off       the CHG Soap.  9.  Pat yourself dry with a clean towel.            10.  Wear clean pajamas.            11.  Place clean sheets on your bed the night of your first shower and do not        sleep with pets.  Day of Surgery  Do not apply any lotions/deoderants the morning of surgery.  Please wear clean clothes to the hospital/surgery center.   Please read over the following fact sheets that you were given. Pain Booklet, Coughing and Deep Breathing and Surgical Site Infection Prevention

## 2014-10-17 ENCOUNTER — Other Ambulatory Visit: Payer: Medicare Other

## 2014-10-18 ENCOUNTER — Encounter: Payer: Self-pay | Admitting: Genetic Counselor

## 2014-10-18 NOTE — Progress Notes (Signed)
April Quail, MS, CGC documenting for April Luz, MS (EPIC access pending):  Negative genetic test results were delivered to [name] by phone on Oct 11, 2014.    We reviewed that this genetic test, the Breast/Ovarian Cancer Panel through [facility] [name], MD), analyzed 21 genes (ATM, BARD1, BRCA1, BRCA2, BRIP1, CDH1, CHEK2, EPCAM, FANCC, MLH1, MSH2, MSH6, NBN, PALB2, PMS2, PTEN, RAD51C, RAD51D, STK11, TP53, and XRCC2) for any mutations that might explain her personal history of breast cancer and family history of breast and ovarian cancer.  The test was negative for any disease-causing mutations.    We discussed that [name] medical management would not be altered based upon this negative result.  However, a negative result does not rule out a genetic cause for her personal and family history of cancer.   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  We reviewed what a VUS is--a genetic change which the genetic testing lab has not seen often enough to be able to report whether that change is benign or disease-causing.  We reviewed that we consider a VUS a negative result, but that eventually this VUS will be reclassified as either benign or disease-causing.  Because we treat this VUS as a negative result, we would not recommend any changes to her medical management, and we would not recommend that any other family members have genetic testing based solely on this result.      Currently, genetic testing technology is not perfect and we expect that it will only improve in the future.  In the future, we may find that there is a mutation that was missed in one of the 21 genes that this test considered.  We may also discover that there are additional genes associated with hereditary forms of breast, ovarian, and related cancers that we did not look at with this particular panel.  For this reason, we cannot rule out a  genetic cause for the personal and family history of cancer, but likely greater testing options will be available in the future.   [name] will be sent a copy of the note from her genetic counseling visit, as well as a copy of the genetic test results.  We will also make Dr. Jana Hakim aware of these results. [name] was encouraged to call our offices with any further questions.  April Luz, MS Roma Kayser, MS, CGC

## 2014-10-19 ENCOUNTER — Encounter: Payer: Self-pay | Admitting: Genetic Counselor

## 2014-10-19 DIAGNOSIS — Z1379 Encounter for other screening for genetic and chromosomal anomalies: Secondary | ICD-10-CM | POA: Insufficient documentation

## 2014-10-19 MED ORDER — CEFAZOLIN SODIUM-DEXTROSE 2-3 GM-% IV SOLR
2.0000 g | INTRAVENOUS | Status: AC
  Administered 2014-10-20 (×2): 2 g via INTRAVENOUS
  Filled 2014-10-19: qty 50

## 2014-10-19 NOTE — Anesthesia Preprocedure Evaluation (Addendum)
Anesthesia Evaluation  Patient identified by MRN, date of birth, ID band Patient awake    Reviewed: Allergy & Precautions, NPO status , Patient's Chart, lab work & pertinent test results, reviewed documented beta blocker date and time   Airway Mallampati: II   Neck ROM: Full    Dental  (+) Teeth Intact, Dental Advisory Given   Pulmonary former smoker,  Lung nodule either metastatic breast or primary lung breath sounds clear to auscultation        Cardiovascular hypertension, Pt. on medications Rhythm:Regular     Neuro/Psych    GI/Hepatic negative GI ROS, Neg liver ROS,   Endo/Other  Thyroid nodule  Renal/GU negative Renal ROS     Musculoskeletal Metastatic tumor involving T3 with compression   Abdominal (+)  Abdomen: soft.    Peds  Hematology   Anesthesia Other Findings   Reproductive/Obstetrics                            Anesthesia Physical Anesthesia Plan  ASA: III  Anesthesia Plan: General   Post-op Pain Management:    Induction:   Airway Management Planned: Oral ETT  Additional Equipment: Arterial line  Intra-op Plan:   Post-operative Plan: Extubation in OR  Informed Consent: I have reviewed the patients History and Physical, chart, labs and discussed the procedure including the risks, benefits and alternatives for the proposed anesthesia with the patient or authorized representative who has indicated his/her understanding and acceptance.     Plan Discussed with:   Anesthesia Plan Comments: (SP R mastectomy and node dissection 1985, new breast lesion on Left,  Stage IV metastatic breast CA with spine involvement, would like arterial line and Two IV if possible.  Could use foot for 2nd IV or both on left, will order T&S to be drawn in am)        Anesthesia Quick Evaluation

## 2014-10-20 ENCOUNTER — Inpatient Hospital Stay (HOSPITAL_COMMUNITY)
Admission: RE | Admit: 2014-10-20 | Discharge: 2014-10-27 | DRG: 457 | Disposition: A | Discharge status: Home Health | Payer: Medicare Other | Source: Ambulatory Visit | Attending: Neurosurgery | Admitting: Neurosurgery

## 2014-10-20 ENCOUNTER — Inpatient Hospital Stay (HOSPITAL_COMMUNITY): Payer: Medicare Other

## 2014-10-20 ENCOUNTER — Encounter (HOSPITAL_COMMUNITY): Payer: Self-pay | Admitting: *Deleted

## 2014-10-20 ENCOUNTER — Inpatient Hospital Stay (HOSPITAL_COMMUNITY): Payer: Medicare Other | Admitting: Anesthesiology

## 2014-10-20 ENCOUNTER — Inpatient Hospital Stay (HOSPITAL_COMMUNITY): Payer: Medicare Other | Admitting: Vascular Surgery

## 2014-10-20 ENCOUNTER — Encounter (HOSPITAL_COMMUNITY)
Admission: RE | Disposition: A | Discharge status: Home Health | Payer: Self-pay | Source: Ambulatory Visit | Attending: Neurosurgery

## 2014-10-20 DIAGNOSIS — M549 Dorsalgia, unspecified: Secondary | ICD-10-CM | POA: Diagnosis not present

## 2014-10-20 DIAGNOSIS — Z87310 Personal history of (healed) osteoporosis fracture: Secondary | ICD-10-CM

## 2014-10-20 DIAGNOSIS — Z8042 Family history of malignant neoplasm of prostate: Secondary | ICD-10-CM

## 2014-10-20 DIAGNOSIS — C7989 Secondary malignant neoplasm of other specified sites: Secondary | ICD-10-CM | POA: Diagnosis not present

## 2014-10-20 DIAGNOSIS — I10 Essential (primary) hypertension: Secondary | ICD-10-CM | POA: Diagnosis present

## 2014-10-20 DIAGNOSIS — Z7982 Long term (current) use of aspirin: Secondary | ICD-10-CM

## 2014-10-20 DIAGNOSIS — D487 Neoplasm of uncertain behavior of other specified sites: Secondary | ICD-10-CM | POA: Diagnosis not present

## 2014-10-20 DIAGNOSIS — Z888 Allergy status to other drugs, medicaments and biological substances status: Secondary | ICD-10-CM

## 2014-10-20 DIAGNOSIS — M810 Age-related osteoporosis without current pathological fracture: Secondary | ICD-10-CM | POA: Diagnosis present

## 2014-10-20 DIAGNOSIS — M5124 Other intervertebral disc displacement, thoracic region: Secondary | ICD-10-CM | POA: Diagnosis not present

## 2014-10-20 DIAGNOSIS — R911 Solitary pulmonary nodule: Secondary | ICD-10-CM | POA: Diagnosis present

## 2014-10-20 DIAGNOSIS — G9529 Other cord compression: Secondary | ICD-10-CM | POA: Diagnosis present

## 2014-10-20 DIAGNOSIS — E041 Nontoxic single thyroid nodule: Secondary | ICD-10-CM | POA: Diagnosis present

## 2014-10-20 DIAGNOSIS — Z8249 Family history of ischemic heart disease and other diseases of the circulatory system: Secondary | ICD-10-CM | POA: Diagnosis not present

## 2014-10-20 DIAGNOSIS — M4322 Fusion of spine, cervical region: Secondary | ICD-10-CM | POA: Diagnosis not present

## 2014-10-20 DIAGNOSIS — Z87891 Personal history of nicotine dependence: Secondary | ICD-10-CM | POA: Diagnosis not present

## 2014-10-20 DIAGNOSIS — C7951 Secondary malignant neoplasm of bone: Secondary | ICD-10-CM | POA: Diagnosis present

## 2014-10-20 DIAGNOSIS — Z01812 Encounter for preprocedural laboratory examination: Secondary | ICD-10-CM

## 2014-10-20 DIAGNOSIS — M4804 Spinal stenosis, thoracic region: Secondary | ICD-10-CM | POA: Diagnosis not present

## 2014-10-20 DIAGNOSIS — Z803 Family history of malignant neoplasm of breast: Secondary | ICD-10-CM

## 2014-10-20 DIAGNOSIS — Z8041 Family history of malignant neoplasm of ovary: Secondary | ICD-10-CM | POA: Diagnosis not present

## 2014-10-20 DIAGNOSIS — M8458XA Pathological fracture in neoplastic disease, other specified site, initial encounter for fracture: Secondary | ICD-10-CM | POA: Diagnosis present

## 2014-10-20 DIAGNOSIS — Z79811 Long term (current) use of aromatase inhibitors: Secondary | ICD-10-CM | POA: Diagnosis not present

## 2014-10-20 DIAGNOSIS — Z419 Encounter for procedure for purposes other than remedying health state, unspecified: Secondary | ICD-10-CM

## 2014-10-20 DIAGNOSIS — C50912 Malignant neoplasm of unspecified site of left female breast: Secondary | ICD-10-CM | POA: Diagnosis present

## 2014-10-20 DIAGNOSIS — C801 Malignant (primary) neoplasm, unspecified: Secondary | ICD-10-CM | POA: Diagnosis not present

## 2014-10-20 DIAGNOSIS — Z9889 Other specified postprocedural states: Secondary | ICD-10-CM | POA: Diagnosis not present

## 2014-10-20 HISTORY — PX: APPLICATION OF INTRAOPERATIVE CT SCAN: SHX6668

## 2014-10-20 LAB — ABO/RH: ABO/RH(D): A POS

## 2014-10-20 LAB — PREPARE RBC (CROSSMATCH)

## 2014-10-20 SURGERY — POSTERIOR LUMBAR FUSION 1 LEVEL
Anesthesia: General | Site: Back

## 2014-10-20 MED ORDER — PANTOPRAZOLE SODIUM 40 MG IV SOLR
40.0000 mg | Freq: Every day | INTRAVENOUS | Status: DC
  Administered 2014-10-20 – 2014-10-21 (×2): 40 mg via INTRAVENOUS
  Filled 2014-10-20 (×4): qty 40

## 2014-10-20 MED ORDER — ONDANSETRON HCL 4 MG/2ML IJ SOLN
INTRAMUSCULAR | Status: DC | PRN
  Administered 2014-10-20: 4 mg via INTRAVENOUS

## 2014-10-20 MED ORDER — DEXAMETHASONE SODIUM PHOSPHATE 10 MG/ML IJ SOLN: 10.0000 mg | INTRAMUSCULAR | Status: DC

## 2014-10-20 MED ORDER — HYDROCODONE-ACETAMINOPHEN 5-325 MG PO TABS
1.0000 | ORAL_TABLET | ORAL | Status: DC | PRN
  Administered 2014-10-20 – 2014-10-26 (×27): 2 via ORAL
  Filled 2014-10-20 (×14): qty 2
  Filled 2014-10-20: qty 1
  Filled 2014-10-20 (×9): qty 2
  Filled 2014-10-20: qty 1
  Filled 2014-10-20 (×5): qty 2

## 2014-10-20 MED ORDER — DEXAMETHASONE SODIUM PHOSPHATE 10 MG/ML IJ SOLN
INTRAMUSCULAR | Status: AC
  Filled 2014-10-20: qty 1

## 2014-10-20 MED ORDER — ESMOLOL HCL 10 MG/ML IV SOLN
INTRAVENOUS | Status: DC | PRN
  Administered 2014-10-20: 20 mg via INTRAVENOUS
  Administered 2014-10-20: 30 mg via INTRAVENOUS

## 2014-10-20 MED ORDER — NEOSTIGMINE METHYLSULFATE 10 MG/10ML IV SOLN
INTRAVENOUS | Status: DC | PRN
  Administered 2014-10-20: 4 mg via INTRAVENOUS

## 2014-10-20 MED ORDER — ARTIFICIAL TEARS OP OINT
TOPICAL_OINTMENT | OPHTHALMIC | Status: AC
  Filled 2014-10-20: qty 3.5

## 2014-10-20 MED ORDER — PROPOFOL 10 MG/ML IV BOLUS
INTRAVENOUS | Status: AC
  Filled 2014-10-20: qty 20

## 2014-10-20 MED ORDER — DEXAMETHASONE SODIUM PHOSPHATE 10 MG/ML IJ SOLN
INTRAMUSCULAR | Status: DC | PRN
  Administered 2014-10-20: 10 mg via INTRAVENOUS

## 2014-10-20 MED ORDER — SODIUM CHLORIDE 0.9 % IJ SOLN
INTRAMUSCULAR | Status: AC
  Filled 2014-10-20: qty 10

## 2014-10-20 MED ORDER — ARTIFICIAL TEARS OP OINT
TOPICAL_OINTMENT | OPHTHALMIC | Status: DC | PRN
  Administered 2014-10-20: 1 via OPHTHALMIC

## 2014-10-20 MED ORDER — LACTATED RINGERS IV SOLN
INTRAVENOUS | Status: DC | PRN
  Administered 2014-10-20 (×2): via INTRAVENOUS

## 2014-10-20 MED ORDER — SODIUM CHLORIDE 0.9 % IJ SOLN
3.0000 mL | Freq: Two times a day (BID) | INTRAMUSCULAR | Status: DC
  Administered 2014-10-20 – 2014-10-25 (×4): 3 mL via INTRAVENOUS

## 2014-10-20 MED ORDER — CEFAZOLIN SODIUM 1-5 GM-% IV SOLN
1.0000 g | Freq: Three times a day (TID) | INTRAVENOUS | Status: AC
  Administered 2014-10-20 – 2014-10-21 (×2): 1 g via INTRAVENOUS
  Filled 2014-10-20 (×2): qty 50

## 2014-10-20 MED ORDER — SODIUM CHLORIDE 0.9 % IR SOLN
Status: DC | PRN
  Administered 2014-10-20: 500 mL

## 2014-10-20 MED ORDER — LABETALOL HCL 5 MG/ML IV SOLN
INTRAVENOUS | Status: AC
  Administered 2014-10-20: 5 mg
  Filled 2014-10-20: qty 4

## 2014-10-20 MED ORDER — KCL IN DEXTROSE-NACL 20-5-0.45 MEQ/L-%-% IV SOLN
INTRAVENOUS | Status: AC
  Administered 2014-10-20: 80 mL/h via INTRAVENOUS
  Filled 2014-10-20: qty 1000

## 2014-10-20 MED ORDER — FENTANYL CITRATE (PF) 100 MCG/2ML IJ SOLN
INTRAMUSCULAR | Status: AC
Start: 2014-10-20 — End: 2014-10-21
  Filled 2014-10-20: qty 2

## 2014-10-20 MED ORDER — PHENYLEPHRINE 40 MCG/ML (10ML) SYRINGE FOR IV PUSH (FOR BLOOD PRESSURE SUPPORT)
PREFILLED_SYRINGE | INTRAVENOUS | Status: AC
  Filled 2014-10-20: qty 10

## 2014-10-20 MED ORDER — LACTATED RINGERS IV SOLN
INTRAVENOUS | Status: DC | PRN
  Administered 2014-10-20: 07:00:00 via INTRAVENOUS

## 2014-10-20 MED ORDER — PROPOFOL 10 MG/ML IV BOLUS
INTRAVENOUS | Status: DC | PRN
  Administered 2014-10-20: 140 mg via INTRAVENOUS
  Administered 2014-10-20: 60 mg via INTRAVENOUS

## 2014-10-20 MED ORDER — THROMBIN 20000 UNITS EX SOLR
CUTANEOUS | Status: DC | PRN
  Administered 2014-10-20 (×2): 20 mL via TOPICAL

## 2014-10-20 MED ORDER — BISACODYL 5 MG PO TBEC
5.0000 mg | DELAYED_RELEASE_TABLET | Freq: Every day | ORAL | Status: DC | PRN
  Filled 2014-10-20: qty 1

## 2014-10-20 MED ORDER — DOCUSATE SODIUM 100 MG PO CAPS
100.0000 mg | ORAL_CAPSULE | Freq: Two times a day (BID) | ORAL | Status: DC
  Administered 2014-10-22 – 2014-10-27 (×11): 100 mg via ORAL
  Filled 2014-10-20 (×14): qty 1

## 2014-10-20 MED ORDER — ACETAMINOPHEN 10 MG/ML IV SOLN
INTRAVENOUS | Status: AC
  Administered 2014-10-20: 1000 mg via INTRAVENOUS
  Filled 2014-10-20: qty 100

## 2014-10-20 MED ORDER — MEPERIDINE HCL 25 MG/ML IJ SOLN: 6.2500 mg | INTRAMUSCULAR | Status: DC | PRN

## 2014-10-20 MED ORDER — ROCURONIUM BROMIDE 50 MG/5ML IV SOLN
INTRAVENOUS | Status: AC
  Filled 2014-10-20: qty 1

## 2014-10-20 MED ORDER — FENTANYL CITRATE (PF) 100 MCG/2ML IJ SOLN
INTRAMUSCULAR | Status: DC | PRN
  Administered 2014-10-20 (×4): 50 ug via INTRAVENOUS
  Administered 2014-10-20: 100 ug via INTRAVENOUS
  Administered 2014-10-20 (×4): 50 ug via INTRAVENOUS

## 2014-10-20 MED ORDER — EPHEDRINE SULFATE 50 MG/ML IJ SOLN
INTRAMUSCULAR | Status: AC
  Filled 2014-10-20: qty 1

## 2014-10-20 MED ORDER — LIDOCAINE HCL (CARDIAC) 20 MG/ML IV SOLN
INTRAVENOUS | Status: AC
  Filled 2014-10-20: qty 5

## 2014-10-20 MED ORDER — DEXAMETHASONE SODIUM PHOSPHATE 4 MG/ML IJ SOLN
4.0000 mg | Freq: Four times a day (QID) | INTRAMUSCULAR | Status: AC
  Administered 2014-10-20 (×2): 4 mg via INTRAVENOUS
  Filled 2014-10-20 (×2): qty 1

## 2014-10-20 MED ORDER — MIDAZOLAM HCL 2 MG/2ML IJ SOLN
INTRAMUSCULAR | Status: AC
  Filled 2014-10-20: qty 2

## 2014-10-20 MED ORDER — SUCCINYLCHOLINE CHLORIDE 20 MG/ML IJ SOLN
INTRAMUSCULAR | Status: AC
  Filled 2014-10-20: qty 1

## 2014-10-20 MED ORDER — FENTANYL CITRATE (PF) 100 MCG/2ML IJ SOLN
25.0000 ug | INTRAMUSCULAR | Status: DC | PRN
  Administered 2014-10-20: 50 ug via INTRAVENOUS

## 2014-10-20 MED ORDER — CEFAZOLIN SODIUM-DEXTROSE 2-3 GM-% IV SOLR
INTRAVENOUS | Status: AC
  Filled 2014-10-20: qty 50

## 2014-10-20 MED ORDER — DEXAMETHASONE 4 MG PO TABS: 4.0000 mg | ORAL_TABLET | Freq: Four times a day (QID) | ORAL | Status: AC

## 2014-10-20 MED ORDER — ROCURONIUM BROMIDE 50 MG/5ML IV SOLN
INTRAVENOUS | Status: AC
  Filled 2014-10-20: qty 2

## 2014-10-20 MED ORDER — SODIUM CHLORIDE 0.9 % IV SOLN: 250.0000 mL | INTRAVENOUS | Status: DC

## 2014-10-20 MED ORDER — MORPHINE SULFATE 2 MG/ML IJ SOLN
1.0000 mg | INTRAMUSCULAR | Status: DC | PRN
  Administered 2014-10-20 (×2): 2 mg via INTRAVENOUS
  Administered 2014-10-20: 3 mg via INTRAVENOUS
  Administered 2014-10-21: 2 mg via INTRAVENOUS
  Filled 2014-10-20 (×2): qty 2
  Filled 2014-10-20 (×3): qty 1

## 2014-10-20 MED ORDER — ONDANSETRON HCL 4 MG/2ML IJ SOLN
INTRAMUSCULAR | Status: AC
  Filled 2014-10-20: qty 2

## 2014-10-20 MED ORDER — 0.9 % SODIUM CHLORIDE (POUR BTL) OPTIME
TOPICAL | Status: DC | PRN
  Administered 2014-10-20: 1000 mL

## 2014-10-20 MED ORDER — BUPIVACAINE HCL (PF) 0.5 % IJ SOLN
INTRAMUSCULAR | Status: DC | PRN
  Administered 2014-10-20: 30 mL

## 2014-10-20 MED ORDER — ONDANSETRON HCL 4 MG/2ML IJ SOLN: 4.0000 mg | INTRAMUSCULAR | Status: DC | PRN

## 2014-10-20 MED ORDER — SODIUM CHLORIDE 0.9 % IJ SOLN: 3.0000 mL | INTRAMUSCULAR | Status: DC | PRN

## 2014-10-20 MED ORDER — LABETALOL HCL 5 MG/ML IV SOLN
10.0000 mg | Freq: Once | INTRAVENOUS | Status: DC
  Administered 2014-10-20: 10 mg via INTRAVENOUS

## 2014-10-20 MED ORDER — PHENOL 1.4 % MT LIQD
1.0000 | OROMUCOSAL | Status: DC | PRN
  Administered 2014-10-21: 1 via OROMUCOSAL
  Filled 2014-10-20: qty 177

## 2014-10-20 MED ORDER — DEXTROSE 5 % IV SOLN
10.0000 mg | INTRAVENOUS | Status: DC | PRN
  Administered 2014-10-20: 20 ug/min via INTRAVENOUS

## 2014-10-20 MED ORDER — ALBUMIN HUMAN 5 % IV SOLN
INTRAVENOUS | Status: DC | PRN
Start: 2014-10-20 — End: 2014-10-20
  Administered 2014-10-20 (×3): via INTRAVENOUS

## 2014-10-20 MED ORDER — MENTHOL 3 MG MT LOZG: 1.0000 | LOZENGE | OROMUCOSAL | Status: DC | PRN

## 2014-10-20 MED ORDER — FENTANYL CITRATE (PF) 250 MCG/5ML IJ SOLN
INTRAMUSCULAR | Status: AC
  Filled 2014-10-20: qty 5

## 2014-10-20 MED ORDER — KCL IN DEXTROSE-NACL 20-5-0.45 MEQ/L-%-% IV SOLN
80.0000 mL/h | INTRAVENOUS | Status: DC
  Administered 2014-10-20 – 2014-10-21 (×3): 80 mL/h via INTRAVENOUS
  Filled 2014-10-20 (×7): qty 1000

## 2014-10-20 MED ORDER — ACETAMINOPHEN 650 MG RE SUPP: 650.0000 mg | RECTAL | Status: DC | PRN

## 2014-10-20 MED ORDER — THROMBIN 5000 UNITS EX SOLR
OROMUCOSAL | Status: DC | PRN
  Administered 2014-10-20 (×5): 10 mL via TOPICAL

## 2014-10-20 MED ORDER — LIDOCAINE HCL (CARDIAC) 20 MG/ML IV SOLN
INTRAVENOUS | Status: DC | PRN
  Administered 2014-10-20: 100 mg via INTRAVENOUS

## 2014-10-20 MED ORDER — ACETAMINOPHEN 325 MG PO TABS
650.0000 mg | ORAL_TABLET | ORAL | Status: DC | PRN
  Administered 2014-10-25: 650 mg via ORAL
  Filled 2014-10-20 (×2): qty 2

## 2014-10-20 MED ORDER — GLYCOPYRROLATE 0.2 MG/ML IJ SOLN
INTRAMUSCULAR | Status: DC | PRN
  Administered 2014-10-20: 0.6 mg via INTRAVENOUS

## 2014-10-20 MED ORDER — ROCURONIUM BROMIDE 100 MG/10ML IV SOLN
INTRAVENOUS | Status: DC | PRN
  Administered 2014-10-20: 40 mg via INTRAVENOUS
  Administered 2014-10-20: 10 mg via INTRAVENOUS
  Administered 2014-10-20: 20 mg via INTRAVENOUS
  Administered 2014-10-20: 5 mg via INTRAVENOUS
  Administered 2014-10-20: 20 mg via INTRAVENOUS
  Administered 2014-10-20 (×2): 10 mg via INTRAVENOUS

## 2014-10-20 MED ORDER — MIDAZOLAM HCL 5 MG/5ML IJ SOLN
INTRAMUSCULAR | Status: DC | PRN
  Administered 2014-10-20 (×2): 1 mg via INTRAVENOUS

## 2014-10-20 SURGICAL SUPPLY — 73 items
ADH SKN CLS APL DERMABOND .7 (GAUZE/BANDAGES/DRESSINGS) ×2
APL SKNCLS STERI-STRIP NONHPOA (GAUZE/BANDAGES/DRESSINGS) ×2
BAG DECANTER FOR FLEXI CONT (MISCELLANEOUS) ×4 IMPLANT
BENZOIN TINCTURE PRP APPL 2/3 (GAUZE/BANDAGES/DRESSINGS) ×5 IMPLANT
BLADE CLIPPER SURG (BLADE) ×1 IMPLANT
BONE EQUIVA 10CC (Bone Implant) ×1 IMPLANT
BRUSH SCRUB EZ PLAIN DRY (MISCELLANEOUS) ×3 IMPLANT
BUR CUTTER 7.0 ROUND (BURR) ×3 IMPLANT
BUR MATCHSTICK NEURO 3.0 LAGG (BURR) ×3 IMPLANT
CANISTER SUCT 3000ML PPV (MISCELLANEOUS) ×3 IMPLANT
CAP CLSR POST CERV (Cap) ×10 IMPLANT
CONT SPEC 4OZ CLIKSEAL STRL BL (MISCELLANEOUS) ×6 IMPLANT
COVER BACK TABLE 60X90IN (DRAPES) ×4 IMPLANT
DERMABOND ADVANCED (GAUZE/BANDAGES/DRESSINGS) ×1
DERMABOND ADVANCED .7 DNX12 (GAUZE/BANDAGES/DRESSINGS) IMPLANT
DRAPE C-ARM 42X72 X-RAY (DRAPES) ×4 IMPLANT
DRAPE LAPAROTOMY 100X72X124 (DRAPES) ×3 IMPLANT
DRAPE SCAN PATIENT (DRAPES) ×2 IMPLANT
DRAPE SURG 17X23 STRL (DRAPES) ×6 IMPLANT
DRSG OPSITE POSTOP 4X10 (GAUZE/BANDAGES/DRESSINGS) ×1 IMPLANT
DRSG OPSITE POSTOP 4X6 (GAUZE/BANDAGES/DRESSINGS) ×3 IMPLANT
DRSG TELFA 3X8 NADH (GAUZE/BANDAGES/DRESSINGS) IMPLANT
DURAPREP 26ML APPLICATOR (WOUND CARE) ×3 IMPLANT
ELECT REM PT RETURN 9FT ADLT (ELECTROSURGICAL) ×6
ELECTRODE REM PT RTRN 9FT ADLT (ELECTROSURGICAL) ×2 IMPLANT
EVACUATOR 1/8 PVC DRAIN (DRAIN) ×3 IMPLANT
GAUZE SPONGE 4X4 12PLY STRL (GAUZE/BANDAGES/DRESSINGS) ×3 IMPLANT
GAUZE SPONGE 4X4 16PLY XRAY LF (GAUZE/BANDAGES/DRESSINGS) ×1 IMPLANT
GLOVE ECLIPSE 8.0 STRL XLNG CF (GLOVE) ×6 IMPLANT
GLOVE EXAM NITRILE LRG STRL (GLOVE) IMPLANT
GLOVE EXAM NITRILE MD LF STRL (GLOVE) IMPLANT
GLOVE EXAM NITRILE XS STR PU (GLOVE) IMPLANT
GOWN STRL REUS W/ TWL LRG LVL3 (GOWN DISPOSABLE) IMPLANT
GOWN STRL REUS W/ TWL XL LVL3 (GOWN DISPOSABLE) ×4 IMPLANT
GOWN STRL REUS W/TWL 2XL LVL3 (GOWN DISPOSABLE) IMPLANT
GOWN STRL REUS W/TWL LRG LVL3 (GOWN DISPOSABLE) ×6
GOWN STRL REUS W/TWL XL LVL3 (GOWN DISPOSABLE) ×6
HEMOSTAT POWDER KIT SURGIFOAM (HEMOSTASIS) ×5 IMPLANT
KIT BASIN OR (CUSTOM PROCEDURE TRAY) ×3 IMPLANT
KIT ROOM TURNOVER OR (KITS) ×3 IMPLANT
LIQUID BAND (GAUZE/BANDAGES/DRESSINGS) IMPLANT
MARKER SPHERE PSV REFLC 13MM (MARKER) ×8 IMPLANT
NEEDLE HYPO 22GX1.5 SAFETY (NEEDLE) ×3 IMPLANT
NS IRRIG 1000ML POUR BTL (IV SOLUTION) ×3 IMPLANT
PACK LAMINECTOMY NEURO (CUSTOM PROCEDURE TRAY) ×3 IMPLANT
PAD ARMBOARD 7.5X6 YLW CONV (MISCELLANEOUS) ×9 IMPLANT
PAD DRESSING TELFA 3X8 NADH (GAUZE/BANDAGES/DRESSINGS) ×2 IMPLANT
PATTIES SURGICAL .75X.75 (GAUZE/BANDAGES/DRESSINGS) IMPLANT
PIN MAYFIELD SKULL DISP (PIN) ×1 IMPLANT
PIN SKULL STERILE RADIO DISP (PIN) ×1 IMPLANT
ROD LORD VIRAGE 3.5X110 NS (Rod) IMPLANT
ROD LORD VIRAGE 3.5X120 NS (Rod) IMPLANT
ROD VIRAGE TI 3.5X110 (Rod) ×1 IMPLANT
ROD VIRAGE TI 3.5X120 (Rod) ×1 IMPLANT
SCREW VIRAGE 3.5X14 (Screw) ×3 IMPLANT
SCREW VIRAGE 4.0X16MM (Screw) ×4 IMPLANT
SCREW VIRAGE 4.0X18MM (Screw) ×2 IMPLANT
SCREW VIRAGE 4.0X20MM (Screw) ×2 IMPLANT
SCREW VIRAGE POST CERV 3.5X16 (Screw) ×1 IMPLANT
SPONGE LAP 4X18 X RAY DECT (DISPOSABLE) IMPLANT
SPONGE SURGIFOAM ABS GEL 100 (HEMOSTASIS) ×3 IMPLANT
STRIP CLOSURE SKIN 1/2X4 (GAUZE/BANDAGES/DRESSINGS) ×5 IMPLANT
SUT PROLENE 0 CT 1 30 (SUTURE) IMPLANT
SUT VIC AB 0 CT1 18XCR BRD8 (SUTURE) ×2 IMPLANT
SUT VIC AB 0 CT1 8-18 (SUTURE) ×3
SUT VIC AB 2-0 OS6 18 (SUTURE) ×10 IMPLANT
SUT VIC AB 3-0 CP2 18 (SUTURE) ×3 IMPLANT
SYR 20ML ECCENTRIC (SYRINGE) ×3 IMPLANT
TOWEL OR 17X24 6PK STRL BLUE (TOWEL DISPOSABLE) ×3 IMPLANT
TOWEL OR 17X26 10 PK STRL BLUE (TOWEL DISPOSABLE) ×3 IMPLANT
TRAP SPECIMEN MUCOUS 40CC (MISCELLANEOUS) ×3 IMPLANT
TRAY FOLEY W/METER SILVER 14FR (SET/KITS/TRAYS/PACK) ×3 IMPLANT
WATER STERILE IRR 1000ML POUR (IV SOLUTION) ×3 IMPLANT

## 2014-10-20 NOTE — H&P (Signed)
April Rosales is an 74 y.o. female.   Chief Complaint: Left scapular pain HPI: The patient is a 74 year old female who is evaluated in the office back in March for left scapular pain a 4-5 weeks duration. There is no inciting event. Gen. history of breast cancer 1986 was critical lumpectomy x-ray therapy was done well. For 5 weeks prior to being initially seen the patient was sitting in bed and got some pain in her back. She'll with the problem for a few weeks and call orthopedist who did x-rays followed by an MRI scan. She was referred for neurosurgical opinion. Her films were reviewed which showed a destructive abnormality in the left side of the T3 vertebra which extends in the pedicle. There is some extension into the epidural space on the left but minimal neural compression. She underwent a CT scan of the chest abdomen and pelvis which showed a new breast mass in the left. She underwent biopsy which showed breast cancer. She was seen by oncology who felt that was possible of the abnormality in the thoracic region was not metastatic breast cancer. The options were discussed it was elected to bring the patient to the operating room and remove the cancer decompress the spinal cord stabilize things and make a diagnosis so that further treatment can be done appropriately. I've had a long discussion with her regarding the risks and benefits of surgical intervention. The risks discussed include but are not limited to bleeding infection weakness numbness paralysis spinal fluid leak trouble with instrumentation nonunion coma and death. We have discussed alternative methods of therapy along the risks and benefits of nonintervention. She's had the opportunity to rest numerous questions and appears to understand. With this information in hand she has requested we proceed with surgery.  Past Medical History  Diagnosis Date  . Osteoporosis     takes Vit D  . Family history of ovarian cancer   . Breast cancer  1985/2016    takes [name] daily  . Arthritis   . Chronic back pain     stenosis/listhesis    Past Surgical History  Procedure Laterality Date  . Appendectomy  1977  . Abdominal hysterectomy      1980  . Thyroid cyst excision  1967  . Breast surgery Right 1985    Family History  Problem Relation Age of Onset  . Heart disease Mother   . Hypertension Mother   . Heart disease Father   . Diabetes Father   . Breast cancer Paternal Aunt     4 paternal aunts with breast cancer over 42  . Prostate cancer Paternal Uncle   . Stroke Paternal Grandfather   . Ovarian cancer Paternal Aunt   . Huntington's disease Other     Nephew, inherited from his father   Social History:  reports that she has quit smoking. Her smoking use included Cigarettes. She has a 5 pack-year smoking history. She has quit using smokeless tobacco. She reports that she drinks alcohol. She reports that she does not use illicit drugs.  Allergies:  Allergies  Allergen Reactions  . Ativan [Lorazepam]     Made her crazy  . Dilaudid [Hydromorphone Hcl]     Doesn't want  . Haldol [Haloperidol Lactate]     Made her crazy    Medications Prior to Admission  Medication Sig Dispense Refill  . Ascorbic Acid (VITAMIN C PO) Take 1 tablet by mouth every other day. Takes qod    . aspirin 325 MG tablet  Take 325 mg by mouth daily.    . calcium carbonate (TUMS) 500 MG chewable tablet Chew 2 tablets by mouth 2 (two) times daily.     . Cholecalciferol (VITAMIN D3 ADULT GUMMIES PO) Take 4,000 Units by mouth daily.    . Cyanocobalamin (VITAMIN B-12 PO) Take by mouth daily. Takes every 3 days    . Iron-Vitamins (GERITOL PO) Take by mouth daily.    Marland Kitchen letrozole (FEMARA) 2.5 MG tablet Take 1 tablet (2.5 mg total) by mouth daily. 90 tablet 12    No results found for this or any previous visit (from the past 48 hour(s)). No results found.  A comprehensive review of systems was negative.  Blood pressure 138/65, pulse 95,  temperature 97.2 F (36.2 C), temperature source Oral, resp. rate 18, weight 53.524 kg (118 lb), SpO2 97 %.  The patient is awake alert and oriented. She is no facial asymmetry. Her gait is nonantalgic. She is normal strength. Deep tendon reflexes are normal with no pathologic reflexes. Coordination and balance are intact. Assessment/Plan Impression is that of invasive mass T3. The plan is for decompression with stabilization.  Faythe Ghee, MD 10/20/2014, 7:27 AM

## 2014-10-20 NOTE — Anesthesia Procedure Notes (Signed)
Procedure Name: Intubation Date/Time: 10/20/2014 8:01 AM Performed by: Susa Loffler Pre-anesthesia Checklist: Patient identified, Timeout performed, Emergency Drugs available, Suction available and Patient being monitored Patient Re-evaluated:Patient Re-evaluated prior to inductionOxygen Delivery Method: Circle system utilized Preoxygenation: Pre-oxygenation with 100% oxygen Intubation Type: IV induction Ventilation: Mask ventilation without difficulty Laryngoscope Size: Mac and 3 Grade View: Grade I Tube type: Oral Tube size: 7.5 mm Number of attempts: 1 Airway Equipment and Method: Stylet Placement Confirmation: ETT inserted through vocal cords under direct vision,  positive ETCO2 and breath sounds checked- equal and bilateral Secured at: 21 cm Tube secured with: Tape Dental Injury: Teeth and Oropharynx as per pre-operative assessment

## 2014-10-20 NOTE — Op Note (Signed)
Preop diagnosis: Infiltrative tumor T3 with spinal cord compression and pathologic fracture Postop diagnosis: Same with metastatic breast cancer Procedure:  T2-T3 and T4 decompressive laminectomy with removal of epidural tumor C7-T4 segmental pedicle screw instrumentation with virage screw system with arrow guidance protocol C7-T4 posterolateral fusion Surgeon: Ellyana Crigler Asst.: Cabbell  After being placed in 3 point pin fixation in the prone position the patient's neck and thoracic region were prepped and draped in the usual sterile fashion. Linear incision was made over the spinous processes of the lower cervical region and to the upper thoracic region. We carried the incision down to the spinous processes and then did a subperiosteal dissection along the left side of the spinous processes and lamina and self-retaining tract was placed for exposure. We localized without difficulty and then began removing the T4 lamina bilaterally removing the spinous processes. We decompressed T4 T3 and T2 thoroughly. We explored lateral to the thecal sac at L3 and discovered the tumor mass which had eroded into the vertebral body was also pushing on the spinal dura. Very generous removal was carried out to ensure good margins around the spinal dura. We went into the vertebral body as well and removed some of the tumor within that. We sent tissue for frozen section which returned was consistent with metastatic adenocarcinoma. When we had thoroughly decompressed the spinal dura we performed the arrow CT scan protocol to help [place] place our screws. We then placed screws bilaterally in T4 and T5 on the right sided T3 bilaterally at T2 and T1 and on the right side at C7. We then completed another spin to confirm our placement and found good placement of our screws. We then chose appropriately length rods secured them to the top of the screws and did top loading nuts with tightening and final tightening with torque and counter  torque. We irrigated the wound copiously controlled any bleeding with upper coagulation Gelfoam and Surgifoam. We decorticated the far lateral region particularly on the right side with there was more bone for fusion and placed a large amount of morselized allograft. Final x-ray looked excellent in lateral position. We irrigated once more placed 1 more package of Surgifoam and then left an epidural drain in the epidural space and brought out through a separate stab wound incision. The wound was then closed in multiple layers of Vicryls on the muscle fascia subcutaneous and subcuticular tissues. Dermabond and Steri-Strips were placed on the skin. A sterile dressing was then applied and the patient was extubated and taken to recovery in stable condition.

## 2014-10-20 NOTE — Anesthesia Postprocedure Evaluation (Signed)
  Anesthesia Post-op Note  Patient: April Rosales  Procedure(s) Performed: Procedure(s): Laminectomy  Decompression Thoracic one ,thoracic two,thoracic three with fixation Cervical seven to Thoracic five with aero (N/A) APPLICATION OF INTRAOPERATIVE CAT SCAN (N/A)  Patient Location: PACU  Anesthesia Type:General  Level of Consciousness: awake  Airway and Oxygen Therapy: Patient Spontanous Breathing  Post-op Pain: mild  Post-op Assessment: Post-op Vital signs reviewed  Post-op Vital Signs: Reviewed and stable  Last Vitals:  Filed Vitals:   10/20/14 1420  BP: 128/71  Pulse: 103  Temp:   Resp: 16    Complications: No apparent anesthesia complications

## 2014-10-20 NOTE — Plan of Care (Signed)
Problem: Consults Goal: Diagnosis - Spinal Surgery Outcome: Completed/Met Date Met:  10/20/14 T2-T3-T4 Decompression Laminectomy

## 2014-10-20 NOTE — Transfer of Care (Signed)
Immediate Anesthesia Transfer of Care Note  Patient: April Rosales  Procedure(s) Performed: Procedure(s): Laminectomy  Decompression Thoracic one ,thoracic two,thoracic three with fixation Cervical seven to Thoracic five with aero (N/A) APPLICATION OF INTRAOPERATIVE CAT SCAN (N/A)  Patient Location: PACU  Anesthesia Type:General  Level of Consciousness: awake, alert  and oriented  Airway & Oxygen Therapy: Patient Spontanous Breathing and Patient connected to nasal cannula oxygen  Post-op Assessment: Report given to RN, Post -op Vital signs reviewed and stable and Patient moving all extremities X 4  Post vital signs: Reviewed and stable  Last Vitals:  Filed Vitals:   10/20/14 0548  BP: 138/65  Pulse: 95  Temp: 36.2 C  Resp: 18    Complications: No apparent anesthesia complications

## 2014-10-21 MED ORDER — LETROZOLE 2.5 MG PO TABS
2.5000 mg | ORAL_TABLET | Freq: Every day | ORAL | Status: DC
  Administered 2014-10-21 – 2014-10-26 (×5): 2.5 mg via ORAL
  Filled 2014-10-21 (×8): qty 1

## 2014-10-21 MED ORDER — CHOLECALCIFEROL 25 MCG (1000 UT) PO CHEW: 4000.0000 [IU] | CHEWABLE_TABLET | Freq: Every day | ORAL | Status: DC

## 2014-10-21 MED ORDER — VITAMIN B-12 100 MCG PO TABS
250.0000 ug | ORAL_TABLET | ORAL | Status: DC
  Administered 2014-10-21 – 2014-10-24 (×2): 250 ug via ORAL
  Filled 2014-10-21: qty 3
  Filled 2014-10-21: qty 1

## 2014-10-21 MED ORDER — ZOLPIDEM TARTRATE 5 MG PO TABS
5.0000 mg | ORAL_TABLET | Freq: Every evening | ORAL | Status: DC | PRN
  Administered 2014-10-21: 5 mg via ORAL
  Filled 2014-10-21: qty 1

## 2014-10-21 MED ORDER — CALCIUM CARBONATE ANTACID 500 MG PO CHEW
2.0000 | CHEWABLE_TABLET | Freq: Two times a day (BID) | ORAL | Status: DC
  Administered 2014-10-21 – 2014-10-26 (×9): 400 mg via ORAL
  Filled 2014-10-21 (×13): qty 2

## 2014-10-21 MED ORDER — VITAMIN C 500 MG PO TABS
250.0000 mg | ORAL_TABLET | ORAL | Status: DC
  Administered 2014-10-23 – 2014-10-25 (×2): 250 mg via ORAL
  Filled 2014-10-21 (×4): qty 1

## 2014-10-21 MED ORDER — VITAMIN D3 25 MCG (1000 UNIT) PO TABS
4000.0000 [IU] | ORAL_TABLET | Freq: Every day | ORAL | Status: DC
  Administered 2014-10-21 – 2014-10-26 (×6): 4000 [IU] via ORAL
  Filled 2014-10-21 (×11): qty 4

## 2014-10-21 MED ORDER — TRAMADOL HCL 50 MG PO TABS
50.0000 mg | ORAL_TABLET | ORAL | Status: DC | PRN
  Administered 2014-10-21 – 2014-10-26 (×6): 100 mg via ORAL
  Administered 2014-10-27: 50 mg via ORAL
  Filled 2014-10-21: qty 2
  Filled 2014-10-21: qty 1
  Filled 2014-10-21 (×6): qty 2

## 2014-10-21 NOTE — Evaluation (Signed)
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  Pt currently limited functionally due to the problems listed below.  (see problems list.)  Pt will benefit from PT to maximize function and safety to be able to get home safely with available assist of family.     Follow Up Recommendations Home health PT;Supervision/Assistance - 24 hour;Other (comment) (SNF if no 24 hour assist)    Equipment Recommendations  Other (comment) (TBA (did not get a full account of equip.))    Recommendations for Other Services       Precautions / Restrictions Precautions Precautions: Fall      Mobility  Bed Mobility Overal bed mobility: Needs Assistance;+2 for physical assistance Bed Mobility: Rolling;Sidelying to Sit Rolling: Mod assist Sidelying to sit: Mod assist;+2 for physical assistance       General bed mobility comments: reinforced logroll and technique side to/from sit.  Transfers Overall transfer level: Needs assistance   Transfers: Sit to/from Stand;Stand Pivot Transfers Sit to Stand: Min assist Stand pivot transfers: Min assist       General transfer comment: stability assist and assist to decrease anxiety  Ambulation/Gait             General Gait Details: NT  Stairs            Wheelchair Mobility    Modified Rankin (Stroke Patients Only)       Balance Overall balance assessment: Needs assistance Sitting-balance support: Feet supported;Bilateral upper extremity supported Sitting balance-Leahy Scale: Poor Sitting balance - Comments: UE's due to pain, dizziness and mild instability   Standing balance support:  Bilateral upper extremity supported Standing balance-Leahy Scale: Poor                               Pertinent Vitals/Pain Pain Assessment: Faces Faces Pain Scale: Hurts even more Pain Location: back Pain Descriptors / Indicators: Aching;Sharp;Operative site guarding Pain Intervention(s): Premedicated before session;Monitored during session;Repositioned    Home Living Family/patient expects to be discharged to:: Private residence Living Arrangements: Alone Available Help at Discharge: Family;Available 24 hours/day Type of Home: House Home Access: Stairs to enter Entrance Stairs-Rails: Psychiatric nurse of Steps: 1-4 Home Layout: One level Home Equipment: Walker - 2 wheels (TBA further)      Prior Function Level of Independence: Independent               Hand Dominance        Extremity/Trunk Assessment   Upper Extremity Assessment: Defer to OT evaluation           Lower Extremity Assessment: Generalized weakness;Overall WFL for tasks assessed      Cervical / Trunk Assessment: Kyphotic  Communication   Communication: No difficulties  Cognition Arousal/Alertness: Awake/alert Behavior During Therapy: Anxious Overall Cognitive Status: Within Functional Limits for tasks assessed       Memory: Decreased short-term memory              General Comments General comments (skin integrity, edema, etc.): instructed in back care/prec, log roll, lifting restrictions and progression of activity    Exercises        Assessment/Plan  PT Assessment Patient needs continued PT services  PT Diagnosis Acute pain;Generalized weakness   PT Problem List Decreased strength;Decreased activity tolerance;Decreased balance;Decreased mobility;Decreased knowledge of use of DME;Decreased knowledge of precautions;Pain  PT Treatment Interventions Gait training;DME instruction;Stair training;Functional mobility training;Therapeutic  activities;Balance training;Patient/family education   PT Goals (Current goals can be found in the Care Plan section) Acute Rehab PT Goals Patient Stated Goal: independent and decreased pain PT Goal Formulation: With patient Time For Goal Achievement: 11/04/14 Potential to Achieve Goals: Good    Frequency Min 5X/week   Barriers to discharge        Co-evaluation               End of Session   Activity Tolerance: Patient tolerated treatment well;Patient limited by pain Patient left: in chair;with call bell/phone within reach Nurse Communication: Mobility status         Time: 6720-9470 PT Time Calculation (min) (ACUTE ONLY): 42 min   Charges:   PT Evaluation $Initial PT Evaluation Tier I: 1 Procedure PT Treatments $Therapeutic Activity: 8-22 mins $Self Care/Home Management: 8-22   PT G Codes:        April Rosales, Tessie Fass 10/21/2014, 5:43 PM 10/21/2014  Donnella Sham, PT [phone]  (pager)

## 2014-10-21 NOTE — Progress Notes (Signed)
Patient ID: April Rosales, female   DOB: [birth year], 75 y.o.   MRN: [id number] BP 121/56 mmHg  Pulse 78  Temp(Src) 97.9 F (36.6 C) (Axillary)  Resp 11  Ht 5\' 2"  (1.575 m)  Wt 52.617 kg (116 lb)  BMI 21.21 kg/m2  SpO2 96% Alert and oriented x 4 Wound is clean, and dry Moving all extremities Doing well

## 2014-10-22 LAB — POCT I-STAT 7, (LYTES, BLD GAS, ICA,H+H)
Acid-base deficit: 1 mmol/L (ref 0.0–2.0)
Acid-base deficit: 2 mmol/L (ref 0.0–2.0)
BICARBONATE: 25.1 meq/L — AB (ref 20.0–24.0)
Bicarbonate: 24.7 mEq/L — ABNORMAL HIGH (ref 20.0–24.0)
Bicarbonate: 22.3 mEq/L (ref 20.0–24.0)
CALCIUM ION: 1.17 mmol/L (ref 1.13–1.30)
Calcium, Ion: 1.22 mmol/L (ref 1.13–1.30)
Calcium, Ion: 1.12 mmol/L — ABNORMAL LOW (ref 1.13–1.30)
HCT: 30 % — ABNORMAL LOW (ref 36.0–46.0)
HCT: 29 % — ABNORMAL LOW (ref 36.0–46.0)
HEMATOCRIT: 34 % — AB (ref 36.0–46.0)
HEMOGLOBIN: 10.2 g/dL — AB (ref 12.0–15.0)
Hemoglobin: 11.6 g/dL — ABNORMAL LOW (ref 12.0–15.0)
Hemoglobin: 9.9 g/dL — ABNORMAL LOW (ref 12.0–15.0)
O2 Saturation: 100 %
O2 Saturation: 100 %
O2 Saturation: 100 %
PCO2 ART: 39 mmHg (ref 35.0–45.0)
PCO2 ART: 40 mmHg (ref 35.0–45.0)
PH ART: 7.402 (ref 7.350–7.450)
PH ART: 7.403 (ref 7.350–7.450)
PH ART: 7.39 (ref 7.350–7.450)
PO2 ART: 293 mmHg — AB (ref 80.0–100.0)
POTASSIUM: 3.8 mmol/L (ref 3.5–5.1)
Potassium: 3.9 mmol/L (ref 3.5–5.1)
Potassium: 4.2 mmol/L (ref 3.5–5.1)
SODIUM: 138 mmol/L (ref 135–145)
Sodium: 137 mmol/L (ref 135–145)
Sodium: 138 mmol/L (ref 135–145)
TCO2: 26 mmol/L (ref 0–100)
TCO2: 26 mmol/L (ref 0–100)
TCO2: 23 mmol/L (ref 0–100)
pCO2 arterial: 36.7 mmHg (ref 35.0–45.0)
pO2, Arterial: 290 mmHg — ABNORMAL HIGH (ref 80.0–100.0)
pO2, Arterial: 288 mmHg — ABNORMAL HIGH (ref 80.0–100.0)

## 2014-10-22 MED ORDER — PANTOPRAZOLE SODIUM 40 MG PO TBEC
40.0000 mg | DELAYED_RELEASE_TABLET | Freq: Every day | ORAL | Status: DC
  Administered 2014-10-22 – 2014-10-26 (×5): 40 mg via ORAL
  Filled 2014-10-22 (×5): qty 1

## 2014-10-22 NOTE — Progress Notes (Signed)
Patient ID: April Rosales, female   DOB: Jan 07, 1941, 74 y.o.   MRN: [id number] C/o incisional pain. No weakness. Sensory normal.to floor.

## 2014-10-22 NOTE — Progress Notes (Signed)
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 and pathologic fracture s/p T2-T4 decompressive lami and PLA at C7-T4    PT Comments    Able to take a few steps today; benefits from encouragement to increase activity; Reported dizziness, likely related to pain meds as pt was not hypotensive with activity; HR elevated with range 110-134 with activity;   It seems her position of most comfort is with her chin supported anteriorly; It's worth considering a soft collar for her for comfort and support   Follow Up Recommendations  Home health PT;Supervision/Assistance - 24 hour;Other (comment) (SNF if no 24 hour assist; Pt states she has a hired caregiver who can stay with her 24 hours -- I'd like to get this verified)     Equipment Recommendations  Standard walker;Wheelchair (measurements PT)    Recommendations for Other Services OT consult     Precautions / Restrictions Precautions Precautions: Fall    Mobility  Bed Mobility Overal bed mobility: Needs Assistance;+2 for physical assistance Bed Mobility: Rolling;Sidelying to Sit Rolling: Mod assist Sidelying to sit: Mod assist;+2 for physical assistance       General bed mobility comments: reinforced logroll and technique side to/from sit.  Transfers Overall transfer level: Needs assistance Equipment used: Rolling walker (2 wheeled) Transfers: Sit to/from Stand Sit to Stand: Min assist         General transfer comment: stability assist and assist to decrease anxiety; support given at low back and elbow  Ambulation/Gait Ambulation/Gait assistance: Min assist;+2 safety/equipment Ambulation Distance (Feet): 5 Feet Assistive device: Rolling walker (2 wheeled) Gait Pattern/deviations: Step-through pattern;Decreased stride  length;Decreased step length - right;Decreased step length - left   Gait velocity interpretation: Below normal speed for age/gender General Gait Details: Cues to self-monitor for activity tolerance   Stairs            Wheelchair Mobility    Modified Rankin (Stroke Patients Only)       Balance Overall balance assessment: Needs assistance   Sitting balance-Leahy Scale: Poor       Standing balance-Leahy Scale: Poor                      Cognition Arousal/Alertness: Awake/alert Behavior During Therapy: Anxious Overall Cognitive Status: Within Functional Limits for tasks assessed       Memory: Decreased short-term memory              Exercises      General Comments        Pertinent Vitals/Pain Pain Assessment: Faces Faces Pain Scale: Hurts even more Pain Location: back and neck; difficult to find position of comfort Pain Descriptors / Indicators: Aching Pain Intervention(s): Limited activity within patient's tolerance;Monitored during session;Repositioned;Premedicated before session    Home Living                      Prior Function            PT Goals (current goals can now be found in the care plan section) Acute Rehab PT Goals Patient Stated Goal: independent and decreased pain PT Goal Formulation: With patient Time For Goal Achievement: 11/04/14 Potential to Achieve Goals: Good Progress towards PT goals: Progressing toward goals    Frequency  Min 5X/week    PT Plan Current plan  remains appropriate    Co-evaluation             End of Session   Activity Tolerance: Patient tolerated treatment well;Patient limited by pain Patient left: in chair;with call bell/phone within reach     Time: 2957-4734 PT Time Calculation (min) (ACUTE ONLY): 23 min  Charges:  $Gait Training: 8-22 mins $Therapeutic Activity: 8-22 mins                    G Codes:      Quin Hoop 10/22/2014, 9:55 AM  Roney Marion, [place] Pager [phone] Office [phone]

## 2014-10-23 MED ORDER — SENNA 8.6 MG PO TABS
1.0000 | ORAL_TABLET | Freq: Two times a day (BID) | ORAL | Status: DC
  Administered 2014-10-23 – 2014-10-26 (×7): 8.6 mg via ORAL
  Filled 2014-10-23 (×8): qty 1

## 2014-10-23 NOTE — Progress Notes (Signed)
Patient ID: April Rosales, female   DOB: 31-Oct-1940, 74 y.o.   MRN: [id number] Stable, c/o incisional pain. No weakness

## 2014-10-23 NOTE — Evaluation (Signed)
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 She will benefit from acute OT with follow up [place] with 24 hour care (which pt says she has hired).    Follow Up Recommendations  Home health OT;Supervision/Assistance - 24 hour    Equipment Recommendations  None recommended by OT (states her sister is going to get her a shower seat)       Precautions / Restrictions Precautions Precautions: Fall Required Braces or Orthoses:  (none--pt has one in room, but per chart and per pt Dr. Hal Neer does not want her to wear it)) Restrictions Weight Bearing Restrictions: No      Mobility Bed Mobility Overal bed mobility: Needs Assistance Bed Mobility: Rolling;Sidelying to Sit Rolling: Supervision (HOB and use or rail) Sidelying to sit: Supervision;HOB elevated          Transfers Overall transfer level: Needs assistance Equipment used: 1 person hand held assist Transfers: Sit to/from [facility] Sit to Stand: Min guard Stand pivot transfers: Min assist                 ADL Overall ADL's : Needs assistance/impaired Eating/Feeding: Set up;Sitting   Grooming: Moderate assistance;Sitting   Upper Body Bathing: Minimal assitance;Sitting   Lower Body Bathing: Moderate assistance (with min guard A sit<>stand)   Upper Body Dressing : Moderate assistance;Sitting   Lower Body Dressing: Maximal assistance (with min guard A sit<>stand)   Toilet Transfer: Minimal  assistance;Stand-pivot (bed>recliner)   Toileting- Clothing Manipulation and Hygiene: Moderate assistance (with min guard A sit<>stand)               Vision Additional Comments: No change from baseline          Pertinent Vitals/Pain Pain Assessment: Faces Faces Pain Scale: Hurts even more Pain Location: neck and upper back--any pressure on her wound site hurts Pain Descriptors / Indicators: Aching;Sore Pain Intervention(s): Monitored during session;Repositioned (Pt said she wanted pain meds, but then when nurse came in she said that she would rather not, that if she could just get comfortable the pain/ache would settle down)     Hand Dominance Right   Extremity/Trunk Assessment Upper Extremity Assessment Upper Extremity Assessment: Overall WFL for tasks assessed (she does have decreased shoulder movements due to her kyphotic posture)       Cervical / Trunk Assessment Cervical / Trunk Assessment: Kyphotic   Communication Communication Communication: No difficulties   Cognition Arousal/Alertness: Awake/alert Behavior During Therapy: WFL for tasks assessed/performed Overall Cognitive Status: Within Functional Limits for tasks assessed                                Home Living Family/patient expects to be discharged to:: Private residence Living Arrangements: Alone Available Help at Discharge: Personal care attendant;Available 24 hours/day Type of Home: House Home Access: Stairs to enter [employer] of Steps: 1-4 Entrance Stairs-Rails: Right;Left Home Layout: One level     Bathroom Shower/Tub: Tub/shower unit;Curtain Shower/tub characteristics: Architectural technologist: Standard  Home Equipment: [place] - 2 wheels;Bedside commode          Prior Functioning/Environment Level of Independence: Independent             OT Diagnosis: Generalized weakness;Acute pain   OT Problem List: Decreased strength;Decreased range of  motion;Decreased activity tolerance;Impaired balance (sitting and/or standing);Pain;Decreased knowledge of precautions;Decreased knowledge of use of DME or AE   OT Treatment/Interventions: Self-care/ADL training;DME and/or AE instruction;Patient/family education    OT Goals(Current goals can be found in the care plan section) Acute Rehab OT Goals Patient Stated Goal: just want to find a comfortable position to be in OT Goal Formulation: With patient Time For Goal Achievement: 10/30/14 Potential to Achieve Goals: Good  OT Frequency: Min 3X/week              End of Session Equipment Utilized During Treatment:  (none) Nurse Communication: Patient requests pain meds (but then when nursing came to room, pt did not want them)  Activity Tolerance: Patient limited by fatigue;Patient limited by pain Patient left: in chair;with call bell/phone within reach;with chair alarm set   Time: 8242-3536 OT Time Calculation (min): 30 min Charges:  OT General Charges $OT Visit: 1 Procedure OT Evaluation $Initial OT Evaluation Tier I: 1 Procedure OT Treatments $Self Care/Home Management : 8-22 mins  Almon Register [phone] 10/23/2014, 9:04 AM

## 2014-10-23 NOTE — Progress Notes (Signed)
Called to to room by therapy stating patient having severe pain. Patient refused restarting of IV, and IV morphine. Patient made comfortable with numerous pillow props and sprite.

## 2014-10-23 NOTE — Progress Notes (Signed)
Pt arrived on unit Transfer from 3MW 0000hrs. Pt transported via W/C, was able to ambulate with asst from W/C to bed, A&Ox4, Pt oriented to room and equipment

## 2014-10-23 NOTE — Progress Notes (Signed)
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 Max encouragement for ambulation. Ambulated 60 ft with one standing rest break and close supervision/ min guard for stability. Educated patient on importance of OOB and increased activity. Family supportive. Will continue to see and progress as tolerated.  Follow Up Recommendations  Home health PT;Supervision/Assistance - 24 hour;Other (comment) (SNF if no 24 hour assist)     Equipment Recommendations  Standard walker;Wheelchair (measurements PT)    Recommendations for Other Services OT consult     Precautions / Restrictions Precautions Precautions: Fall Required Braces or Orthoses:  (none--pt has one in room, but per chart and per pt Dr. Hal Neer does not want her to wear it)) Restrictions Weight Bearing Restrictions: No    Mobility  Bed Mobility               General bed mobility comments: received sitting EOB  Transfers Overall transfer level: Needs assistance Equipment used: Rolling walker (2 wheeled) Transfers: Sit to/from Stand Sit to Stand: Min guard         General transfer comment: Min guard for stability, patient transferred from bed as well as bedside commode  Ambulation/Gait Ambulation/Gait assistance: Min guard Ambulation Distance (Feet): 60 Feet Assistive device: Rolling walker (2 wheeled) Gait Pattern/deviations: Step-through pattern;Decreased stride length;Decreased step length - right;Decreased step length - left Gait velocity: decreased Gait velocity interpretation: Below normal speed for age/gender General Gait Details: VCs for upright posture and relaxation during mobility. Patient very  reluctant to increase activity   Stairs            Wheelchair Mobility    Modified Rankin (Stroke Patients Only)       Balance   Sitting-balance support: Feet unsupported Sitting balance-Leahy Scale: Fair     Standing balance support: Bilateral upper extremity supported Standing balance-Leahy Scale: Poor (to fair) Standing balance comment: able to stand without UE support for brief periods but reports increased pain                    Cognition Arousal/Alertness: Awake/alert Behavior During Therapy: WFL for tasks assessed/performed Overall Cognitive Status: Within Functional Limits for tasks assessed       Memory: Decreased short-term memory              Exercises      General Comments General comments (skin integrity, edema, etc.): educated on importance of mobility. Encouraged increased activity and distance thorughout the day. Encouraged OOB to chair for all meals.       Pertinent Vitals/Pain Pain Assessment: Faces Faces Pain Scale: Hurts even more Pain Location: neck and upper back Pain Descriptors / Indicators: Aching Pain Intervention(s): Limited activity within patient's tolerance;Monitored during session;Repositioned;Relaxation    Home Living                      Prior Function            PT Goals (current goals can now be found in the care plan section) Acute Rehab PT Goals Patient Stated Goal: just want to find a comfortable position to be in PT Goal Formulation: With patient Time For Goal Achievement: 11/04/14 Potential to Achieve  Goals: Good Progress towards PT goals: Progressing toward goals (modestly)    Frequency  Min 5X/week    PT Plan Current plan remains appropriate    Co-evaluation             End of Session Equipment Utilized During Treatment: Gait belt Activity Tolerance: Patient tolerated treatment well;Patient limited by pain Patient left: in chair;with call bell/phone within reach      Time: 1115-1136 PT Time Calculation (min) (ACUTE ONLY): 21 min  Charges:  $Gait Training: 8-22 mins                    G CodesDuncan Dull 13-Nov-2014, 1:39 PM Alben Deeds, [place] DPT  [phone]

## 2014-10-24 ENCOUNTER — Ambulatory Visit: Payer: Medicare Other | Admitting: Oncology

## 2014-10-24 ENCOUNTER — Encounter (HOSPITAL_COMMUNITY): Payer: Self-pay | Admitting: Neurosurgery

## 2014-10-24 LAB — TYPE AND SCREEN
ABO/RH(D): A POS
ANTIBODY SCREEN: NEGATIVE
Unit division: 0
Unit division: 0

## 2014-10-24 MED ORDER — ENSURE ENLIVE PO LIQD
237.0000 mL | Freq: Two times a day (BID) | ORAL | Status: DC
  Administered 2014-10-24 – 2014-10-27 (×6): 237 mL via ORAL

## 2014-10-24 NOTE — Progress Notes (Signed)
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 Tolerated stair training with Min guard assist and max coaxing. Pt reluctant to increase activity level. Reports dizziness from pain medication during mobility. Improving ambulation distance with supervision for safety. Pt reports having caregiver at home at d/c. Will continue to follow to maximize independence prior to return home.   Follow Up Recommendations  Home health PT;Supervision/Assistance - 24 hour;Other (comment)     Equipment Recommendations  Standard walker;Wheelchair (measurements PT)    Recommendations for Other Services       Precautions / Restrictions Precautions Precautions: Fall Restrictions Weight Bearing Restrictions: No    Mobility  Bed Mobility Overal bed mobility: Needs Assistance Bed Mobility: Rolling;Sidelying to Sit;Sit to Sidelying Rolling: Supervision Sidelying to sit: Supervision;HOB elevated     Sit to sidelying: Supervision General bed mobility comments: Supervision to get to EOB. Use of rails. Likes to get OOB her "own way" despite cues for log roll technique.  Transfers Overall transfer level: Needs assistance Equipment used: Rolling walker (2 wheeled) Transfers: Sit to/from Stand Sit to Stand: Min guard         General transfer comment: Min guard for safety. Stood from [facility] x1, from bed x1, from chair x1.  + dizziness reported however no sway or LOB.  Ambulation/Gait Ambulation/Gait assistance: Min guard Ambulation Distance (Feet): 150 Feet Assistive device: Rolling walker (2 wheeled) Gait Pattern/deviations: Step-through pattern;Decreased stride length;Narrow  base of support Gait velocity: decreased Gait velocity interpretation: Below normal speed for age/gender General Gait Details: VCs for upright posture and relaxation during mobility. Patient very reluctant to increase activity.   Stairs Stairs: Yes Stairs assistance: Min guard Stair Management: Step to pattern;Sideways;One rail Right Number of Stairs: 2 General stair comments: Use of BUEs on 1 rail for support. Cues for technique/safety.   Wheelchair Mobility    Modified Rankin (Stroke Patients Only)       Balance Overall balance assessment: Needs assistance Sitting-balance support: Feet supported;No upper extremity supported Sitting balance-Leahy Scale: Fair     Standing balance support: During functional activity Standing balance-Leahy Scale: Fair                      Cognition Arousal/Alertness: Awake/alert Behavior During Therapy: WFL for tasks assessed/performed Overall Cognitive Status: Within Functional Limits for tasks assessed                      Exercises      General Comments General comments (skin integrity, edema, etc.): pt's caregiver present in room during session. Encouraged OOB to chair for all meals.       Pertinent Vitals/Pain Pain Assessment: Faces Faces Pain Scale: Hurts whole lot Pain Location: neck and upper back Pain Descriptors / Indicators: Aching;Sore Pain Intervention(s): Monitored during session;Repositioned;Limited activity within patient's tolerance;Premedicated before session    Home Living                      Prior Function            PT Goals (current goals can now be found in the care plan section) Progress towards PT goals: Progressing toward goals  Frequency  Min 5X/week    PT Plan Current plan remains appropriate    Co-evaluation             End of Session Equipment Utilized During Treatment: Gait belt Activity Tolerance: Patient tolerated treatment well;Patient limited by  pain Patient left: in bed;with call bell/phone within reach;with bed alarm set;with family/visitor present     Time: 5208-0223 PT Time Calculation (min) (ACUTE ONLY): 29 min  Charges:  $Gait Training: 23-37 mins                    G Codes:      [place] 10/24/2014, 11:25 AM Wray Kearns, [place], DPT [phone]

## 2014-10-24 NOTE — Progress Notes (Signed)
Patient ID: April Rosales, female   DOB: 1940-06-06, 74 y.o.   MRN: [id number] Afeb, vss No new neuro issues Has a fair amount of pain but slowly increasing activity. Will change her pain meds a bit and see if that helps. Hopefully home in next day or so.

## 2014-10-24 NOTE — Progress Notes (Signed)
Initial Nutrition Assessment  DOCUMENTATION CODES:  Not applicable  INTERVENTION:  Ensure Enlive (each supplement provides 350kcal and 20 grams of protein)  NUTRITION DIAGNOSIS:  Inadequate oral intake related to dysphagia as evidenced by meal completion < 50%, per patient/family report.   GOAL:  Patient will meet greater than or equal to 90% of their needs   MONITOR:  PO intake, Labs, Weight trends, Skin  REASON FOR ASSESSMENT:  Consult Poor PO  ASSESSMENT: 74 year old female with history of breast cancer and osteoporosis. S/p T2-T3 and T4 decompressive laminectomy with removal of epidural tumor on 5/27.   RD consulted for poor PO intake. Pt reports eating well PTA and weighing about 118 lbs pre-op. She reports eating very little since surgery due to having a dry throat, making it difficult to swallow as food feels like it gets stuck. Pt reports eating 50% or less of her meals; per nursing notes meal completion has been 50-75%. Pt states nurse brought her Ensure yesterday which she would like to continue receiving since it is easy to swallow liquids. Pt was eating soup that a friend brought her at time of visit.   Labs reviewed.   Height:  Ht Readings from Last 1 Encounters:  10/23/14 5\' 2"  (1.575 m)    Weight:  Wt Readings from Last 1 Encounters:  10/23/14 121 lb 14.4 oz (55.293 kg)    Ideal Body Weight:  50 kg  Wt Readings from Last 10 Encounters:  10/23/14 121 lb 14.4 oz (55.293 kg)  10/13/14 118 lb (53.524 kg)  09/13/14 121 lb 5 oz (55.027 kg)  08/31/14 120 lb 4.8 oz (54.568 kg)  08/28/14 120 lb 6.4 oz (54.613 kg)  08/21/14 122 lb 6.4 oz (55.52 kg)  06/02/14 120 lb 6.4 oz (54.613 kg)  07/07/13 122 lb 3.2 oz (55.43 kg)    BMI:  Body mass index is 22.29 kg/(m^2).  Estimated Nutritional Needs:  Kcal:  1600-1800  Protein:  70-80 grams  Fluid:  1.6-1.8 L/day  Skin:  Wound (see comment) (closed incision on back)  Diet Order:  DIET SOFT Room  service appropriate?: Yes; Fluid consistency:: Thin  EDUCATION NEEDS:  No education needs identified at this time   Intake/Output Summary (Last 24 hours) at 10/24/14 1628 Last data filed at 10/24/14 1341  Gross per 24 hour  Intake    410 ml  Output     20 ml  Net    390 ml    Last BM:  5/27  Pryor Ochoa RD, LDN Inpatient Clinical Dietitian Pager: [phone] After Hours Pager: [phone]

## 2014-10-25 NOTE — Progress Notes (Signed)
Patient ID: April Rosales, female   DOB: 02-16-1941, 74 y.o.   MRN: [id number] Afeb, vss Looks much better today. Hopefully she can ambulate a few times and then be discharged tomorrow. Her wound is clean and dry. Will d/c hemovac today.

## 2014-10-25 NOTE — Progress Notes (Signed)
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 Patient ambulating increased distance and tolerated well. Educated on importance of mobility during recovery. Patient appears receptive. Will continue to see and progress as tolerated.  Follow Up Recommendations  Home health PT;Supervision/Assistance - 24 hour;Other (comment)     Equipment Recommendations  Standard walker    Recommendations for Other Services       Precautions / Restrictions Precautions Precautions: Fall Required Braces or Orthoses:  (none--pt has one in room, but per chart and per pt Dr. Hal Neer does not want her to wear it)) Restrictions Weight Bearing Restrictions: No    Mobility  Bed Mobility Overal bed mobility: Needs Assistance Bed Mobility: Rolling;Sit to Sidelying Rolling: Supervision       Sit to sidelying: Supervision General bed mobility comments: Vcs for postioning, reliance on bed elevation for return to supine  Transfers Overall transfer level: Needs assistance Equipment used: Rolling walker (2 wheeled) Transfers: Sit to/from Stand Sit to Stand: Min guard         General transfer comment: VCs for hand placement and posture, min guard for stability. performed from chair and toilet  Ambulation/Gait Ambulation/Gait assistance: Supervision Ambulation Distance (Feet): 240 Feet Assistive device: Rolling walker (2 wheeled) Gait Pattern/deviations: Step-through pattern;Decreased stride length;Narrow base of support Gait velocity: decreased Gait velocity interpretation: Below normal speed for age/gender General Gait Details: VCs for  upright posture and relaxation during mobility. Patient very reluctant to increase activity.   Stairs            Wheelchair Mobility    Modified Rankin (Stroke Patients Only)       Balance Overall balance assessment: Needs assistance Sitting-balance support: Feet supported Sitting balance-Leahy Scale: Fair     Standing balance support: During functional activity Standing balance-Leahy Scale: Fair                      Cognition Arousal/Alertness: Awake/alert Behavior During Therapy: WFL for tasks assessed/performed Overall Cognitive Status: Within Functional Limits for tasks assessed                      Exercises      General Comments General comments (skin integrity, edema, etc.): patient performed self care and hygiene during session.        Pertinent Vitals/Pain Pain Assessment: Faces Faces Pain Scale: Hurts little more Pain Location: neck and back Pain Descriptors / Indicators: Aching;Sore Pain Intervention(s): Monitored during session;Repositioned;Relaxation    Home Living                      Prior Function            PT Goals (current goals can now be found in the care plan section) Acute Rehab PT Goals Patient Stated Goal: To go home PT Goal Formulation: With patient Time For Goal Achievement: 11/04/14 Potential to Achieve Goals: Good Progress towards PT goals: Progressing toward goals    Frequency  Min 5X/week    PT Plan Current plan remains appropriate    Co-evaluation             End of  Session Equipment Utilized During Treatment: Gait belt Activity Tolerance: Patient tolerated treatment well;Patient limited by pain Patient left: in bed;with call bell/phone within reach;with bed alarm set;with family/visitor present     Time: 0940-1004 PT Time Calculation (min) (ACUTE ONLY): 24 min  Charges:  $Gait Training: 8-22 mins $Therapeutic Activity: 8-22 mins                    G CodesDuncan Dull November 22, 2014, 1:37 PM Alben Deeds, [place] DPT  [phone]

## 2014-10-25 NOTE — Progress Notes (Signed)
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  Educated in bathing, dressing, pericare, and bed mobility adhering to back precautions. Pt declined practicing tub transfers this visit, wants to wait until she is home and practice with [place].  Caregiver is knowledgeable in multiple uses of 3 in 1 and how to set up.  Pt's sister is getting shower equipment for pt.  Educated pt in importance for OOB activity and to avoid sitting for prolonged periods.  Pt and caregiver verbalizing understanding.  Follow Up Recommendations  Home health OT;Supervision/Assistance - 24 hour    Equipment Recommendations  None recommended by OT    Recommendations for Other Services      Precautions / Restrictions Precautions Precautions: Fall;Back Precaution Booklet Issued: Yes (comment) Precaution Comments: educated pt and caregiver at length in back precautions       Mobility Bed Mobility Overal bed mobility: Needs Assistance Bed Mobility: Rolling;Sidelying to Sit;Sit to Sidelying Rolling: Supervision Sidelying to sit: Supervision;HOB elevated     Sit to sidelying: Supervision General bed mobility comments: Vcs for postioning, reliance on bed elevation for return to supine  Transfers Overall transfer level: Needs assistance Equipment used: Rolling walker (2 wheeled) Transfers: Sit to/from Stand Sit to Stand: Supervision         General transfer comment: no physical assist    Balance                                   ADL Overall ADL's : Needs assistance/impaired     Grooming: Wash/dry hands;Supervision/safety;Standing       Lower  Body Bathing: Minimal assistance;Sit to/from stand   Upper Body Dressing : Set up;Sitting   Lower Body Dressing: Minimal assistance;Sit to/from stand   Toilet Transfer: Supervision/safety;Ambulation;BSC;RW   Toileting- Clothing Manipulation and Hygiene: Supervision/safety;Sit to/from stand       Functional mobility during ADLs: Supervision/safety;Rolling walker General ADL Comments: Educated pt and caregiver in back precautions related to ADL and IADL.  Pt not quite able to cross her foot over opposite knee due to pain. Instructed in use of reacher for LB dressing and drying feet and retrieving items.  Educated in safe footwear, multiple uses of 3 in 1 and set up, benefits of shower seat and hand held shower head, recommended long handled bath sponge.       Vision                     Perception     Praxis      Cognition   Behavior During Therapy: WFL for tasks assessed/performed Overall Cognitive Status: Within Functional Limits for tasks assessed       Memory: Decreased short-term memory               Extremity/Trunk Assessment               Exercises     Shoulder Instructions       General Comments      Pertinent Vitals/ Pain       Pain Assessment: Faces Faces Pain Scale: Hurts even more Pain Location:  back Pain Descriptors / Indicators: Aching Pain Intervention(s): Limited activity within patient's tolerance;Monitored during session;Premedicated before session;Repositioned  Home Living                                          Prior Functioning/Environment              Frequency Min 3X/week     Progress Toward Goals  OT Goals(current goals can now be found in the care plan section)  Progress towards OT goals: Progressing toward goals  Acute Rehab OT Goals Patient Stated Goal: To go home Time For Goal Achievement: 10/30/14 Potential to Achieve Goals: Good  Plan Discharge plan remains appropriate     Co-evaluation                 End of Session Equipment Utilized During Treatment: Rolling walker   Activity Tolerance Patient tolerated treatment well   Patient Left in bed;with call bell/phone within reach;with family/visitor present;with bed alarm set   Nurse Communication          Time: 4696-2952 OT Time Calculation (min): 42 min  Charges: OT General Charges $OT Visit: 1 Procedure OT Treatments $Self Care/Home Management : 23-37 mins  Malka So 10/25/2014, 4:11 PM [phone]

## 2014-10-26 ENCOUNTER — Encounter (HOSPITAL_COMMUNITY): Payer: Self-pay | Admitting: Neurosurgery

## 2014-10-26 NOTE — Progress Notes (Addendum)
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 Patient very nervous and anxious about mobility for fear of hurting herself. Reassured by MD and PT that she is safe with mobility. Encouraged patient to continue mobilization and activity AD LIB with supervision from family (nsg agreeable). Will continue to see and progress as tolerated for anticipated discharge.  Follow Up Recommendations  Home health PT;Supervision/Assistance - 24 hour;Other (comment)     Equipment Recommendations  Rolling walker with 5" wheels   Recommendations for Other Services OT consult     Precautions / Restrictions Restrictions Weight Bearing Restrictions: No    Mobility  Bed Mobility Overal bed mobility: Needs Assistance Bed Mobility: Rolling;Sidelying to Sit;Sit to Sidelying Rolling: Supervision Sidelying to sit: Supervision;HOB elevated     Sit to sidelying: Supervision General bed mobility comments: Vcs for postioning, reliance on bed elevation for return to supine  Transfers Overall transfer level: Needs assistance Equipment used: None   Sit to Stand: Supervision         General transfer comment: no physical assist  Ambulation/Gait Ambulation/Gait assistance: Supervision Ambulation Distance (Feet): 160 Feet Assistive device: None Gait Pattern/deviations: Step-through pattern;Decreased stride length;Narrow base of support Gait velocity: decreased Gait velocity interpretation: Below normal speed for age/gender General Gait Details: VCs for increased cadence and encouragement for mobility   Stairs            Wheelchair Mobility    Modified  Rankin (Stroke Patients Only)       Balance     Sitting balance-Leahy Scale: Fair     Standing balance support: During functional activity Standing balance-Leahy Scale: Fair                      Cognition Arousal/Alertness: Awake/alert Behavior During Therapy: Flat affect Overall Cognitive Status: Within Functional Limits for tasks assessed                      Exercises      General Comments        Pertinent Vitals/Pain Pain Assessment: Faces Faces Pain Scale: Hurts little more Pain Location: back Pain Descriptors / Indicators: Aching Pain Intervention(s): Monitored during session    Home Living                      Prior Function            PT Goals (current goals can now be found in the care plan section) Acute Rehab PT Goals Patient Stated Goal: to go home PT Goal Formulation: With patient Time For Goal Achievement: 11/04/14 Potential to Achieve Goals: Good Progress towards PT goals: Progressing toward goals    Frequency  Min 5X/week    PT Plan Current plan remains appropriate    Co-evaluation             End of Session Equipment Utilized During Treatment: Gait belt Activity Tolerance: Patient tolerated treatment well;Patient limited by pain Patient left: in bed;with call bell/phone within reach;with bed alarm set;with family/visitor present     Time: 1220-1238 PT Time Calculation (min) (ACUTE ONLY): 18 min  Charges:  $Gait Training: 8-22  mins                    G CodesDuncan Dull 14-Nov-2014, 4:23 PM Alben Deeds, [place] DPT  [phone]

## 2014-10-26 NOTE — Care Management Note (Signed)
Case Management Note  Patient Details  Name: April Rosales MRN: [id number] Date of Birth: 06/11/1940  Subjective/Objective:                    Action/Plan: Met with patient, patient's sister and friend to discuss discharge planning.  Per conversation with Dr Hal Neer, patient needs to increase ambulation prior to discharge home.  He asked that CM approach patient regarding discharge plans.  Patient states that she does intend to increase her ambulation today.  She mentioned that she had been fearful of moving too much or incorrectly.  She now feels more at ease after speaking with Dr Hal Neer.  The plan is for patient to discharge home tomorrow. She will have someone with her at all times after discharge.  CM awaits instruction from Dr Hal Neer regarding any home health needs/orders.  Bedside RN updated. Expected Discharge Date:                  Expected Discharge Plan:  [place]  In-House Referral:     Discharge planning Services     Post Acute Care Choice:    Choice offered to:     DME Arranged:    DME Agency:     HH Arranged:    [place] Agency:     Status of Service:  In process, will continue to follow  Medicare Important Message Given:  Yes Date Medicare IM Given:  10/24/14 Medicare IM give by:  Lorne Skeens RN, MSN CM Date Additional Medicare IM Given:    Additional Medicare Important Message give by:     If discussed at [place] of Stay Meetings, dates discussed:    Additional Comments:  Rolm Baptise, RN 10/26/2014, 2:39 PM

## 2014-10-26 NOTE — Progress Notes (Signed)
Chaplain initiated visit with pt. Friend at bedside. Pt expressed that her primary concern is fear. She is afraid of moving too much, fearing she will injure herself again and need to go through the process again, or not be able to be well. Pt expressed that she is in pain, more than she thought she would be post-op. Chaplain encouraged pt to write down questions she may have about treatment and pain so that she can ask her physician later today. Chaplain offered word of prayer with pt. Chaplain encouraged pt to page a chaplain if she needs it. Chaplain will continue to follow.   10/26/14 1000  Clinical Encounter Type  Visited With Patient and family together  Visit Type Initial;Spiritual support  Spiritual Encounters  Spiritual Needs Emotional;Prayer  Stress Factors  Patient Stress Factors Health changes;Other (Comment) (Pain; Fear)  Montario Zilka, Barbette Hair, Chaplain 10/26/2014 10:21 AM

## 2014-10-26 NOTE — Clinical Social Work Note (Signed)
CSW Consult Acknowledged:   CSW received a consult for SNF placement. Pt will transition home with HHPT. CSW will sign off.   [place], MSW, [place]

## 2014-10-26 NOTE — Progress Notes (Signed)
Patient ID: April Rosales, female   DOB: 10-21-1940, 74 y.o.   MRN: [id number] Afeb, vss She is moving well according to PT but she is so nervous and hesitant that it is slowing down her recovery. Her wound looks great. Her strength is good. Will contact social work to see if she needs a snf.

## 2014-10-27 ENCOUNTER — Encounter (HOSPITAL_COMMUNITY): Payer: Self-pay | Admitting: Neurosurgery

## 2014-10-27 MED ORDER — TRAMADOL HCL 50 MG PO TABS: 50.0000 mg | ORAL_TABLET | Freq: Four times a day (QID) | ORAL | Status: DC | PRN

## 2014-10-27 NOTE — Discharge Summary (Signed)
  Physician Discharge Summary  Patient ID: April Rosales MRN: [id number] DOB/AGE: June 01, 1940 74 y.o.  Admit date: 10/20/2014 Discharge date: 10/27/2014  Admission Diagnoses:  Discharge Diagnoses:  Active Problems:   Metastasis to bone   Discharged Condition: good  Hospital Course: Surgery 1 week ago for tumor of T3; did well, but very slow to progress; eventuaslyy made steady progress to the point where she was ambulating well, and was ready for d/c. Her wound looked good with no redness or drainage; home with specific instructions given;  Consults: None  Significant Diagnostic Studies: none  Treatments: surgery: C7-T5 fusion with decompression  Discharge Exam: Blood pressure 136/68, pulse 123, temperature 98 F (36.7 C), temperature source Oral, resp. rate 20, height 5\' 2"  (1.575 m), weight 55.293 kg (121 lb 14.4 oz), SpO2 98 %. Incision/Wound:clean and dry; no new neuro issues  Disposition: Final discharge disposition not confirmed     Medication List    ASK your doctor about these medications        aspirin 325 MG tablet  Take 325 mg by mouth daily.     GERITOL PO  Take by mouth daily.     letrozole 2.5 MG tablet  Commonly known as:  FEMARA  Take 1 tablet (2.5 mg total) by mouth daily.     TUMS 500 MG chewable tablet  Generic drug:  calcium carbonate  Chew 2 tablets by mouth 2 (two) times daily.     VITAMIN B-12 PO  Take by mouth daily. Takes every 3 days     VITAMIN C PO  Take 1 tablet by mouth every other day. Takes qod     VITAMIN D3 ADULT GUMMIES PO  Take 4,000 Units by mouth daily.         At home rest most of the time. Get up 9 or 10 times each day and take a 15 or 20 minute walk. No riding in the car and to your first postoperative appointment. If you have neck surgery you may shower from the chest down starting on the third postoperative day. If you had back surgery he may start showering on the third postoperative day with saran wrap wrapped  around your incisional area 3 times. After the shower remove the saran wrap. Take pain medicine as needed and other medications as instructed. Call my office for an appointment.  SignedFaythe Ghee, MD 10/27/2014, 12:56 PM

## 2014-10-27 NOTE — Progress Notes (Signed)
Chaplain initiated follow up with pt. Pt and chaplain continued some of the themes from previous visit. Chaplain offered prayer with pt upon pt request. Chaplain signing off.    10/27/14 0900  Clinical Encounter Type  Visited With Patient  Visit Type Follow-up;Spiritual support  Spiritual Encounters  Spiritual Needs Emotional;Prayer  Stress Factors  Patient Stress Factors Major life changes  April Rosales, Barbette Hair, Chaplain 10/27/2014 9:40 AM

## 2014-10-27 NOTE — Progress Notes (Signed)
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 Patient ambulating and performed stair negotiation. Continued to attempt to instill confidence and reinforce patients ability to perform activity but despite pointing out successes, patient remains with flat and weary affect. Will continue to see and progress as tolerated.  Follow Up Recommendations  Home health PT;Supervision/Assistance - 24 hour;Other (comment)     Equipment Recommendations  Standard walker    Recommendations for Other Services OT consult     Precautions / Restrictions Precautions Precautions: Fall Restrictions Weight Bearing Restrictions: No    Mobility  Bed Mobility Overal bed mobility: Needs Assistance Bed Mobility: Rolling;Sidelying to Sit;Sit to Sidelying Rolling: Supervision Sidelying to sit: Supervision;HOB elevated     Sit to sidelying: Supervision General bed mobility comments: Vcs for postioning, reliance on bed elevation for return to supine  Transfers Overall transfer level: Needs assistance Equipment used: None Transfers: Sit to/from Stand Sit to Stand: Supervision         General transfer comment: no physical assist  Ambulation/Gait Ambulation/Gait assistance: Supervision Ambulation Distance (Feet): 170 Feet Assistive device: None Gait Pattern/deviations: Step-through pattern;Decreased stride length;Narrow base of support Gait velocity: decreased   General Gait Details: VCs for increased cadence and encouragement for mobility   Stairs Stairs: Yes Stairs assistance: Supervision Stair Management: One rail  Right;Step to pattern;Sideways Number of Stairs: 3 General stair comments: USe of BUEs and sideways navigation, no physical assist required  Wheelchair Mobility    Modified Rankin (Stroke Patients Only)       Balance     Sitting balance-Leahy Scale: Fair       Standing balance-Leahy Scale: Fair                      Cognition Arousal/Alertness: Awake/alert Behavior During Therapy: Flat affect Overall Cognitive Status: Within Functional Limits for tasks assessed                      Exercises      General Comments        Pertinent Vitals/Pain Pain Assessment: Faces Faces Pain Scale: Hurts a little bit Pain Location: neck Pain Descriptors / Indicators: Sore Pain Intervention(s): Monitored during session    Home Living                      Prior Function            PT Goals (current goals can now be found in the care plan section) Acute Rehab PT Goals Patient Stated Goal: to go home PT Goal Formulation: With patient Time For Goal Achievement: 11/04/14 Potential to Achieve Goals: Good Progress towards PT goals: Progressing toward goals    Frequency  Min 5X/week    PT Plan Current plan remains appropriate    Co-evaluation             End of Session Equipment Utilized During Treatment: Gait belt Activity Tolerance: Patient tolerated treatment well;Patient limited by pain Patient left: in bed;with call bell/phone within reach;with bed alarm set     Time: 4268-3419 PT Time Calculation (min) (ACUTE ONLY): 15 min  Charges:  $Gait Training: 8-22 mins                    G Codes:      Duncan Dull November 16, 2014, 11:33 AM Alben Deeds, PT DPT  [phone]

## 2014-11-01 ENCOUNTER — Encounter (HOSPITAL_COMMUNITY): Payer: Self-pay | Admitting: Neurosurgery

## 2014-11-03 ENCOUNTER — Other Ambulatory Visit (HOSPITAL_BASED_OUTPATIENT_CLINIC_OR_DEPARTMENT_OTHER): Payer: Medicare Other

## 2014-11-03 DIAGNOSIS — C50912 Malignant neoplasm of unspecified site of left female breast: Secondary | ICD-10-CM

## 2014-11-03 DIAGNOSIS — C50212 Malignant neoplasm of upper-inner quadrant of left female breast: Secondary | ICD-10-CM

## 2014-11-03 LAB — COMPREHENSIVE METABOLIC PANEL (CC13)
ALBUMIN: 3 g/dL — AB (ref 3.5–5.0)
ALT: 16 U/L (ref 0–55)
AST: 18 U/L (ref 5–34)
Alkaline Phosphatase: 64 U/L (ref 40–150)
Anion Gap: 10 mEq/L (ref 3–11)
BUN: 9.2 mg/dL (ref 7.0–26.0)
CO2: 27 mEq/L (ref 22–29)
CREATININE: 0.6 mg/dL (ref 0.6–1.1)
Calcium: 9.6 mg/dL (ref 8.4–10.4)
Chloride: 105 mEq/L (ref 98–109)
Glucose: 112 mg/dl (ref 70–140)
Potassium: 4.5 mEq/L (ref 3.5–5.1)
Sodium: 142 mEq/L (ref 136–145)
TOTAL PROTEIN: 6.6 g/dL (ref 6.4–8.3)
Total Bilirubin: 0.25 mg/dL (ref 0.20–1.20)

## 2014-11-03 LAB — CBC WITH DIFFERENTIAL/PLATELET
BASO%: 0.4 % (ref 0.0–2.0)
Basophils Absolute: 0 10*3/uL (ref 0.0–0.1)
EOS%: 1.4 % (ref 0.0–7.0)
Eosinophils Absolute: 0.1 10*3/uL (ref 0.0–0.5)
HCT: 36.4 % (ref 34.8–46.6)
HGB: 12 g/dL (ref 11.6–15.9)
LYMPH%: 11.2 % — AB (ref 14.0–49.7)
MCH: 30.2 pg (ref 25.1–34.0)
MCHC: 33 g/dL (ref 31.5–36.0)
MCV: 91.3 fL (ref 79.5–101.0)
MONO#: 0.7 10*3/uL (ref 0.1–0.9)
MONO%: 8 % (ref 0.0–14.0)
NEUT#: 6.6 10*3/uL — ABNORMAL HIGH (ref 1.5–6.5)
NEUT%: 79 % — ABNORMAL HIGH (ref 38.4–76.8)
Platelets: 505 10*3/uL — ABNORMAL HIGH (ref 145–400)
RBC: 3.99 10*6/uL (ref 3.70–5.45)
RDW: 14.4 % (ref 11.2–14.5)
WBC: 8.3 10*3/uL (ref 3.9–10.3)
lymph#: 0.9 10*3/uL (ref 0.9–3.3)

## 2014-11-05 NOTE — Progress Notes (Signed)
[facility]  [phone] [phone]     ID: QUINNLEY COLASURDO DOB: [birth year]  MR#: [id number]  EOF#:[id number]  Patient Care Team: [name], MD as PCP - General (Internal Medicine) Chauncey Cruel, MD as Consulting Physician (Oncology) PCP: [name], MD GYN: SU: [name] M.D. OTHER MD:  Rodell Perna M.D., [name] MD, Stephannie Peters M.D.  CHIEF COMPLAINT: Stage IV breast cancer  CURRENT TREATMENT:  Letrozole; zolendronate; awaiting radiation and palbociclib   BREAST CANCER HISTORY: From the original intake note:    Belma underwent right lumpectomy in 1985 at [facility] for what sounds like a stage I breast cancer. She tells me she had more than 30 lymph nodes removed from her right axilla and all of them were clear. She received  adjuvant radiation but no systemic treatment.  The patient had recently refused mammography with the last mammogram I can find dating back to August 2010.  More recently the patient presented with left scapular pain radiating down the left arm.  She was evaluated by Dr. [name], who obtained a chest x-ray showing a possible abnormality at T3. He then set up the patient for an MRI of the thoracic spine performed 08/16/2014.  This showed multiple compression fractures  (a similar picture had been noted on lumbar MRI 10/09/2011 ). However at T3 they noted tumor in the vertebral body extending into the left pedicle and into the lateral epidural space, displacing the cord to the right. There was no evidence of cord compression or cord signal abnormality. There were no other areas of tumor identified in the thoracic spine.         The patient was then referred to Dr. Hal Neer who on 08/24/2014 set [name] up for CT scans of the chest, abdomen and pelvis. There was a dense mass in the upper inner quadrant of the left breast measuring 1.6 cm. There was a 1.2 cm nodule in the minor fissure of  the right lung and some evidence of right lung fibrosis at the site of the prior radiation port.  There was also a thyroid mass measuring 2 cm. However there were no parenchymal lung or liver lesions. Incidental meningoceles were noted as well as sclerosis of the fifth and sixth ribs which were felt to be likely posttraumatic.   On 08/29/2014 the patient underwent bilateral diagnostic mammography with tomosynthesis and left breast ultrasonography.  There were postsurgical changes in the upper right breast. In the left breast there was an irregular mass measuring 2.3 cm in the upper inner quadrant. This was palpable. Ultrasound showed this to be hypoechoic and to measure 2.0 cm. There were adjacent areas of nodularity. There was no definite lymphadenopathy in the left axilla.  Biopsy of this breast mass 08/29/2014 showed an invasive adenocarcinoma with both ductal and lobular features (there was strong diffuse E-cadherin expression as well as areas with total absence of E-cadherin expression), with the preliminary prognostic profile showing strong estrogen positivity, very weak to near absent progesterone positivity, an MIB-1 of approximately 40%, and HER-2 equivocal  The patient's subsequent history is as detailed below.  INTERVAL HISTORY:  Railynn returns today for follow-up of breast cancer accompanied by her sister, [name]  and by her caregiver Edmonia James. Since her last visit here, on 10/20/2014, [name] underwent T3 decompression. The pathology from this procedure ([id number]) showed a metastatic carcinoma which was estrogen receptor positive. HER-2 was repeated and it was again equivocal, with a signals ratio  of 1.61 and the number per cell 5.40. She is taking letrozole daily and she is tolerating it with no side effects that she is aware of. She obtains it at $25 for 3 month supply  REVIEW OF SYSTEMS:  She did well with the surgery, although she is still "sore" and "numb" on the  back. When she rests her back on a chair as she feels like her mouth feels after the dentist has numbed her gums. Although she is sore, she is not taking any medicine for this not even Tylenol. (She was prescribed tramadol but never took it). Jaquelyn is walking "some". She feels she is doing about the same as she was before. She is not sure if she needs physical therapy or not, although I think that would be a good idea for her. She can feel short of breath sometimes. Sometimes she has abdominal pain, which is very intermittent, and not directly related to bowel movements. She thinks this may be "gas". She has some numbness in the bottom of her feet. She attributes this to the compression stockings she had to wear in the hospital.. A detailed review of systems today was otherwise stable.  PAST MEDICAL HISTORY: Past Medical History  Diagnosis Date  . Osteoporosis     takes Vit D  . Family history of ovarian cancer   . Breast cancer 1985/2016    takes [name] daily  . Arthritis   . Chronic back pain     stenosis/listhesis    PAST SURGICAL HISTORY: Past Surgical History  Procedure Laterality Date  . Appendectomy  1977  . Abdominal hysterectomy      1980  . Thyroid cyst excision  1967  . Breast surgery Right 1985  . Application of intraoperative ct scan N/A 10/20/2014    Procedure: APPLICATION OF INTRAOPERATIVE CAT SCAN;  Surgeon: [name], MD;  Location: [facility];  Service: Neurosurgery;  Laterality: N/A;    FAMILY HISTORY Family History  Problem Relation Age of Onset  . Heart disease Mother   . Hypertension Mother   . Heart disease Father   . Diabetes Father   . Breast cancer Paternal Aunt     4 paternal aunts with breast cancer over 2  . Prostate cancer Paternal Uncle   . Stroke Paternal Grandfather   . Ovarian cancer Paternal Aunt   . Huntington's disease Other     Nephew, inherited from his father   The patient's father died from a heart attack at the age of 68. He  had 9 sisters. 3 of those sisters had breast cancer, all in a menopausal setting. Another sister had ovarian cancer. One of the paternal uncles had cancer of the colon "and back".  The patient's mother died at the age of 57. She was found to have breast cancer shortly before dying , during her final hospitalization.  GYNECOLOGIC HISTORY:  No LMP recorded. Patient has had a hysterectomy.  Menarche age 86, first live birth age 62, the patient is GX P1. She underwent hysterectomy in 1980. She thinks the ovaries were removed, but the CT scan obtained 08/24/2014 showed a definite right ovary. The left ovary may have been removed. She did not take hormone replacement after the hysterectomy.  SOCIAL HISTORY:   Tinley worked in Psychiatric nurse. She is divorced, lives alone, with 2 cats. Her son Arnette Norris sharp lives in [place] where he works in Engineer, technical sales. He has 4 children of his own. The patient is a Psychologist, forensic.  ADVANCED DIRECTIVES:  In place. The patient has named her sister Pleas Koch [phone]) and her friend Janina Mayo [phone]) as joint healthcare powers of attorney   HEALTH MAINTENANCE: History  Substance Use Topics  . Smoking status: Former Smoker -- 0.50 packs/day for 10 years    Types: Cigarettes  . Smokeless tobacco: Former Systems developer     Comment: quit smoking 1970's  . Alcohol Use: 0.0 oz/week    0 Standard drinks or equivalent per week     Comment: Rare     Colonoscopy: never  PAP: status post hysterectomy  Bone density: 08/14/2014 at [facility], results pending  Lipid panel:  Allergies  Allergen Reactions  . Ativan [Lorazepam]     Made her crazy  . Dilaudid [Hydromorphone Hcl]     Doesn't want  . Haldol [Haloperidol Lactate]     Made her crazy    Current Outpatient Prescriptions  Medication Sig Dispense Refill  . Ascorbic Acid (VITAMIN C PO) Take 1 tablet by mouth every other day. Takes qod    . calcium carbonate (TUMS) 500 MG chewable tablet Chew 2 tablets by  mouth 2 (two) times daily.     . Cholecalciferol (VITAMIN D3 ADULT GUMMIES PO) Take 4,000 Units by mouth daily.    . Cyanocobalamin (VITAMIN B-12 PO) Take by mouth daily. Takes every 3 days    . Iron-Vitamins (GERITOL PO) Take by mouth daily.    Marland Kitchen letrozole (FEMARA) 2.5 MG tablet Take 1 tablet (2.5 mg total) by mouth daily. 90 tablet 12   No current facility-administered medications for this visit.    OBJECTIVE:  Older white woman who appears stated age. Filed Vitals:   11/06/14 1111  BP: 148/76  Pulse: 109  Temp: 97.8 F (36.6 C)  Resp: 18     Body mass index is 21.56 kg/(m^2).    ECOG FS:2 - Symptomatic, <50% confined to bed  Sclerae unicteric, EOMs intact Oropharynx clear and moist No cervical or supraclavicular adenopathy; the posterior cervical incision is healing nicely, with no evidence of dehiscence, minimal erythema. Some Steri-Strips are still in place in the lower part of the wound Lungs no rales or rhonchi Heart regular rate and rhythm Abd soft, nontender, positive bowel sounds MSK no focal spinal tenderness, no upper extremity lymphedema, normal straight leg raising bilaterally Neuro: nonfocal, with 5 over 5 dorsiflexion bilaterally and no sensory level; motor is 5 over 5 upper extremities as well; she is well oriented, with appropriate affect Breasts: The right breast is status post remote lumpectomy. There is no evidence of local recurrence. The right axilla is benign. In the left breast I can still feel a mass in the upper inner quadrant. It measures approximately 1-100 quarters centimeters and does seem smaller subjectively. There are no skin changes or nipple changes in the left breast. The left axilla is benign.   LAB RESULTS:  CMP     Component Value Date/Time   NA 142 11/03/2014 1102   NA 138 10/20/2014 1317   K 4.5 11/03/2014 1102   K 4.2 10/20/2014 1317   CL 103 10/13/2014 1427   CO2 27 11/03/2014 1102   CO2 29 10/13/2014 1427   GLUCOSE 112 11/03/2014  1102   GLUCOSE 104* 10/13/2014 1427   BUN 9.2 11/03/2014 1102   BUN 10 10/13/2014 1427   CREATININE 0.6 11/03/2014 1102   CREATININE 0.56 10/13/2014 1427   CREATININE 0.59 08/21/2014 1713   CALCIUM 9.6 11/03/2014 1102   CALCIUM 10.5* 10/13/2014 1427  PROT 6.6 11/03/2014 1102   PROT 6.7 08/21/2014 1713   ALBUMIN 3.0* 11/03/2014 1102   ALBUMIN 4.2 08/21/2014 1713   AST 18 11/03/2014 1102   AST 18 08/21/2014 1713   ALT 16 11/03/2014 1102   ALT 13 08/21/2014 1713   ALKPHOS 64 11/03/2014 1102   ALKPHOS 53 08/21/2014 1713   BILITOT 0.25 11/03/2014 1102   BILITOT 0.4 08/21/2014 1713   GFRNONAA >60 10/13/2014 1427   GFRNONAA >89 08/21/2014 1713   GFRAA >60 10/13/2014 1427   GFRAA >89 08/21/2014 1713    INo results found for: SPEP, UPEP  Lab Results  Component Value Date   WBC 8.3 11/03/2014   NEUTROABS 6.6* 11/03/2014   HGB 12.0 11/03/2014   HCT 36.4 11/03/2014   MCV 91.3 11/03/2014   PLT 505* 11/03/2014      Chemistry      Component Value Date/Time   NA 142 11/03/2014 1102   NA 138 10/20/2014 1317   K 4.5 11/03/2014 1102   K 4.2 10/20/2014 1317   CL 103 10/13/2014 1427   CO2 27 11/03/2014 1102   CO2 29 10/13/2014 1427   BUN 9.2 11/03/2014 1102   BUN 10 10/13/2014 1427   CREATININE 0.6 11/03/2014 1102   CREATININE 0.56 10/13/2014 1427   CREATININE 0.59 08/21/2014 1713      Component Value Date/Time   CALCIUM 9.6 11/03/2014 1102   CALCIUM 10.5* 10/13/2014 1427   ALKPHOS 64 11/03/2014 1102   ALKPHOS 53 08/21/2014 1713   AST 18 11/03/2014 1102   AST 18 08/21/2014 1713   ALT 16 11/03/2014 1102   ALT 13 08/21/2014 1713   BILITOT 0.25 11/03/2014 1102   BILITOT 0.4 08/21/2014 1713       Lab Results  Component Value Date   LABCA2 38 09/20/2014    No components found for: [id number]  No results for input(s): INR in the last 168 hours.  Urinalysis No results found for: COLORURINE, APPEARANCEUR, LABSPEC, PHURINE, GLUCOSEU, HGBUR, BILIRUBINUR, KETONESUR,  PROTEINUR, UROBILINOGEN, NITRITE, LEUKOCYTESUR  STUDIES: Dg Cervical Spine 1 View  10/20/2014   CLINICAL DATA:  T1-3 laminectomy and posterior C7-T5 fixation.  EXAM: DG CERVICAL SPINE - 1 VIEW  COMPARISON:  Earlier 10/20/2014 as well as MRI 09/21/2014  FINDINGS: Examination demonstrates posterior fusion hardware from C7-T5 intact. There is minimal spondylosis of the spine. Support apparatus in place. Recommend correlation with findings at the time of the procedure.  IMPRESSION: Posterior fusion hardware from C7-T5 intact.   Electronically Signed   By: Marin Olp M.D.   On: 10/20/2014 13:43   Dg Cervical Spine 1 View  10/20/2014   CLINICAL DATA:  Elective surgery. T1-T3 laminectomy. Fixation C7-T5.  EXAM: DG CERVICAL SPINE - 1 VIEW  COMPARISON:  MRI 09/21/2014  FINDINGS: Single lateral intraoperative image demonstrates a posterior surgical instrument at the C7-T1 level.  IMPRESSION: Intraoperative localization as above.   Electronically Signed   By: Rolm Baptise M.D.   On: 10/20/2014 11:55    ASSESSMENT: 74 y.o. [place] woman with stage IV left breast cancer involving bone  (1) status post right lumpectomy and axillary node dissection in 1985 followed by radiation   METASTATIC DISEASE 08/16/2014: measurable disease in spine, lung and left breast   (2) evaluation for left shoulder pain led to thoracic spine MRI 08/16/2014 showing a pathologic fracture at T3 with epidural tumor displacing the cord to the right, but no cord compression. CT scans of the chest, abdomen and pelvis 08/24/2014 showed in  addition a mass in the upper left breast measuring 1.6 cm and a nodule in the minor fissure of the right lung measuring 1.2 cm, but no parenchymal lung or liver lesions. CA 27-29 was noninformative at 38 (09/20/2014)    (3) mammography and ultrasonography 08/29/2014 show a mass in the upper inner left breast which was palpable,  measuring 2.0 cm by ultrasound. Biopsy of this mass 08/29/2014 showed an  invasive breast cancer with both lobular and ductal features, estrogen receptor positive, progesterone receptor weakly positive, with an MIB-1 in the 40% range, HER-2 equivocal (6 else ratio 1.5, but average number her nucleus 5.8)   (4) letrozole started 08/31/2014; palbociclib to follow   (5)  zolendronate started 09/20/2014, repeated every 12 weeks   (6) on 10/20/2014 the patient underwent T2-T3 and T4 decompressive laminectomy with removal of epidural tumor, C7-T4 segmental pedicle screw instrumentation with virage screw system with arrow guidance protocol and C7-T4 posterolateral fusion. The cells were positive for the estrogen receptor. HER-2/neu testing by [facility] showed again equivocal results,  (7) adjuvant radiation to follow   (8)  consider eventual left lumpectomy or mastectomy depending on  longer-term results of systemic therapy  (9) genetics testing using the Breast/Ovarian Cancer Panel through [facility] [name], MD) found no mutations in ATM, BARD1, BRCA1, BRCA2, BRIP1, CDH1, CHEK2, EPCAM, FANCC, MLH1, MSH2, MSH6, NBN, PALB2, PMS2, PTEN, RAD51C, RAD51D, STK11, TP53, or XRCC2    PLAN: Semiah did well with her surgery and it did show estrogen receptor positive breast cancer metastatic to the spine. Unfortunately the HER-2 determination was again equivocal. It is almost positive however and I am sure her at least part of her cancer is HER-2 dependent area to  She will meet with Dr. Salley Scarlet this week and clarify what her limitations or if any. If there are no limitations GI think would benefit from physical therapy.  She will be seeing Dr. Pablo Ledger within the next week for adjuvant radiation to the spine area.  She will see me again September 1. Before that visit she will have a brain MRI because of concerns regarding forgetfulness and to complete her staging, and she will also have a repeat ultrasound of the left breast that we can follow the measurable disease there.  If  there has been any growth at all in the left breast lesion we will add trastuzumab to her treatments. Otherwise when she sees me on September 1 we will consider adding Palbociclib.  Elen has a good understanding of the overall plan. She agrees with it. She knows the goal of treatment in her case is control. She will call with any problems that may develop before her next visit here.   Chauncey Cruel, MD   11/06/2014 11:17 AM Medical Oncology and Hematology [facility] [address] [place], [place] [address] Tel. [phone]    Fax. [phone]

## 2014-11-06 ENCOUNTER — Telehealth: Payer: Self-pay | Admitting: Oncology

## 2014-11-06 ENCOUNTER — Ambulatory Visit (HOSPITAL_BASED_OUTPATIENT_CLINIC_OR_DEPARTMENT_OTHER): Payer: Medicare Other | Admitting: Oncology

## 2014-11-06 VITALS — BP 148/76 | HR 109 | Temp 97.8°F | Resp 18 | Ht 62.0 in | Wt 117.9 lb

## 2014-11-06 DIAGNOSIS — C50919 Malignant neoplasm of unspecified site of unspecified female breast: Secondary | ICD-10-CM

## 2014-11-06 DIAGNOSIS — R918 Other nonspecific abnormal finding of lung field: Secondary | ICD-10-CM | POA: Diagnosis not present

## 2014-11-06 DIAGNOSIS — Z853 Personal history of malignant neoplasm of breast: Secondary | ICD-10-CM | POA: Diagnosis not present

## 2014-11-06 DIAGNOSIS — C7951 Secondary malignant neoplasm of bone: Secondary | ICD-10-CM

## 2014-11-06 DIAGNOSIS — M810 Age-related osteoporosis without current pathological fracture: Secondary | ICD-10-CM

## 2014-11-06 DIAGNOSIS — C50212 Malignant neoplasm of upper-inner quadrant of left female breast: Secondary | ICD-10-CM | POA: Diagnosis not present

## 2014-11-06 DIAGNOSIS — C751 Malignant neoplasm of pituitary gland: Secondary | ICD-10-CM

## 2014-11-06 NOTE — Telephone Encounter (Signed)
Gave avs & calendar for July & September. Will call patient with appointment with Dr.Wentworth.

## 2014-11-08 ENCOUNTER — Other Ambulatory Visit: Payer: Self-pay | Admitting: *Deleted

## 2014-11-14 NOTE — Progress Notes (Signed)
Patient for follow up new consult post soft tissue mass excision on 10/20/14 POSTERIOR LUMBAR Laminectomy FUSION 1 LEVEL. FINAL DIAGNOSIS Diagnosis Soft tissue mass, simple excision, Thoracic three - METASTATIC CARCINOMA, CONSISTENT WITH BREAST PRIMARY.  Currently on letrozole started on 08/31/2014.  Reclast:April 2016.  Back pain "2".takes only tylenol for pain.Has a numb sensation at surgical site.Also has some soreness of right rib-cage, right hip and right arm.  Apprehensive about another reclast  Treatment.

## 2014-11-15 ENCOUNTER — Ambulatory Visit
Admission: RE | Admit: 2014-11-15 | Discharge: 2014-11-15 | Disposition: A | Discharge status: Home / Self Care | Payer: Medicare Other | Source: Ambulatory Visit | Attending: Radiation Oncology | Admitting: Radiation Oncology

## 2014-11-15 VITALS — BP 132/78 | HR 99 | Temp 97.7°F | Wt 119.3 lb

## 2014-11-15 DIAGNOSIS — C50919 Malignant neoplasm of unspecified site of unspecified female breast: Secondary | ICD-10-CM

## 2014-11-15 DIAGNOSIS — C50912 Malignant neoplasm of unspecified site of left female breast: Secondary | ICD-10-CM

## 2014-11-15 DIAGNOSIS — C799 Secondary malignant neoplasm of unspecified site: Secondary | ICD-10-CM | POA: Diagnosis not present

## 2014-11-15 NOTE — Progress Notes (Signed)
Please see the Nurse Progress Note in the MD Initial Consult Encounter for this patient. 

## 2014-11-15 NOTE — Progress Notes (Signed)
   Department of Radiation Oncology  Phone:  [phone] Fax:        [phone]   Name: [name] MRN: [id number]  DOB: May 22, 1941  Date: 11/15/2014  Follow Up Visit Note  Diagnosis: Breast cancer metastasized to multiple sites   Staging form: Breast, AJCC 7th Edition     Clinical: Stage IV ([place], NX, M1) - Signed by Chauncey Cruel, MD on 08/31/2014  Summary and Interval since last radiation: RT to the right breast at [facility] in 1985.  Interval History: April Rosales presents today for routine followup.  She had her decompressive surgery with Dr. Hal Neer in on 5/27. This showed metastatic breast cancer. He describes in his op note decompression of T2-T4 with instrumentation placement. This tumor was ER+ and Her2 weakly positive. She has healed up well from her surgery but still has some numbness and pain over her right shoulder blade and shoulder. Her right arm is weak as well. No PT was recommended. She is walking around the house. She has a stitch on her back that is irritating her. She believes she is healing well and is ready to start radiation.   Physical Exam:  Filed Vitals:   11/15/14 1505  BP: 132/78  Pulse: 99  Temp: 97.7 F (36.5 C)  Weight: 119 lb 4.8 oz (54.114 kg)  SpO2: 99%   Well healed scar over her back. 5/5 strength in her bilateral upper extremities. Decreased ROM in right arm to shoulder. Stitch exposed on back midway up scar towards Cspine.   IMPRESSION: April Rosales is a 74 y.o. female s/p decompressive spinal surgery now ready for adjuvant RT  PLAN:  I have offered her palliative RT to the operative bed. We discussed 12 fractions as an outpatient and given posteriorly due to her prior RT. We discussed skin redness, fatigue and esophagitis as the most likely side effects of treatment. We discussed the process of simulation and the placement of tattoos. I scheduled her for simulation tomorrow and will start next week. I encouraged her to call Dr. Hal Neer in  regards to the stitch that is exposed and also encouraged her to consider PT for strengthening and mobilization of her right arm. We discussed the use of a vac loc given her significant kyphosis.   This document serves as a record of services personally performed by Thea Silversmith , MD. It was created on her behalf by Jeralene Peters, a trained medical scribe. The creation of this record is based on the scribe's personal observations and the provider's statements to them. This document has been checked and approved by the attending provider.     Thea Silversmith, MD

## 2014-11-16 ENCOUNTER — Ambulatory Visit
Admission: RE | Admit: 2014-11-16 | Discharge: 2014-11-16 | Disposition: A | Discharge status: Home / Self Care | Payer: Medicare Other | Source: Ambulatory Visit | Attending: Radiation Oncology | Admitting: Radiation Oncology

## 2014-11-16 DIAGNOSIS — C801 Malignant (primary) neoplasm, unspecified: Secondary | ICD-10-CM | POA: Diagnosis not present

## 2014-11-16 DIAGNOSIS — C799 Secondary malignant neoplasm of unspecified site: Secondary | ICD-10-CM | POA: Diagnosis not present

## 2014-11-16 DIAGNOSIS — C7951 Secondary malignant neoplasm of bone: Secondary | ICD-10-CM | POA: Diagnosis not present

## 2014-11-16 DIAGNOSIS — C50912 Malignant neoplasm of unspecified site of left female breast: Secondary | ICD-10-CM | POA: Diagnosis not present

## 2014-11-16 DIAGNOSIS — C50919 Malignant neoplasm of unspecified site of unspecified female breast: Secondary | ICD-10-CM

## 2014-11-16 NOTE — Progress Notes (Signed)
Name: SHELISHA GAUTIER   MRN: [id number]  Date:  11/16/2014  DOB: 1941/02/13    DIAGNOSIS: Metastatic cancer to spine  CONSENT VERIFIED: yes   SET UP: Patient is setup supine   IMMOBILIZATION:  The following immobilization was used: Alpha cradle  NARRATIVE:  Pt Kibler was brought to the CT Energy manager.  Identity was confirmed.  All relevant records and images related to the planned course of therapy were reviewed.  Then, the patient was positioned in a stable reproducible clinical set-up for radiation therapy.  A vacloc was made for immobilization.  CT images were obtained.  An isocenter was placed. Skin markings were placed.  The CT images were loaded into the planning software where the target and avoidance structures were contoured.  The radiation prescription was entered and confirmed. The patient was discharged in stable condition and tolerated simulation well.    TREATMENT PLANNING NOTE:  Treatment planning then occurred. I have requested : MLC's, isodose plan, basic dose calculation  I have requested 3 dimensional simulation with DVH of cord, gtv and lungs.   A total of 1 complex treatment devices will be used in the form of a vacloc.

## 2014-11-17 ENCOUNTER — Telehealth: Payer: Self-pay

## 2014-11-17 ENCOUNTER — Other Ambulatory Visit: Payer: Self-pay | Admitting: Oncology

## 2014-11-17 ENCOUNTER — Telehealth: Payer: Self-pay | Admitting: *Deleted

## 2014-11-17 DIAGNOSIS — C799 Secondary malignant neoplasm of unspecified site: Secondary | ICD-10-CM | POA: Diagnosis not present

## 2014-11-17 DIAGNOSIS — C801 Malignant (primary) neoplasm, unspecified: Secondary | ICD-10-CM | POA: Diagnosis not present

## 2014-11-17 DIAGNOSIS — C50912 Malignant neoplasm of unspecified site of left female breast: Secondary | ICD-10-CM | POA: Diagnosis not present

## 2014-11-17 DIAGNOSIS — C7951 Secondary malignant neoplasm of bone: Secondary | ICD-10-CM | POA: Diagnosis not present

## 2014-11-17 NOTE — Telephone Encounter (Signed)
Benefit Request form for [employer] returned to managed care

## 2014-11-17 NOTE — Telephone Encounter (Signed)
Patient called asking if "information can be sent about Reclast.  I am hesitant to receive this in July without more information on side effects.  I'm having problems since the injection in April.  How do you all know if the medication is working or not?"  Calcium and kidney function levels are monitored is how we measure zometa.  Reveals "soreness in my bones and throbbing in my teeth and I would like those labs done before I receive this medicine."  Denies pain to jaw bone or noise and discomfort with chewing.  [employer] patient education information for zometa printed and mailed to address verified per [facility] records.

## 2014-11-17 NOTE — Telephone Encounter (Signed)
enetred labs for 7/18   thank you

## 2014-11-20 NOTE — Telephone Encounter (Signed)
Thank you :)

## 2014-11-22 ENCOUNTER — Ambulatory Visit
Admission: RE | Admit: 2014-11-22 | Discharge: 2014-11-22 | Disposition: A | Discharge status: Home / Self Care | Payer: Medicare Other | Source: Ambulatory Visit | Attending: Radiation Oncology | Admitting: Radiation Oncology

## 2014-11-22 DIAGNOSIS — C7951 Secondary malignant neoplasm of bone: Secondary | ICD-10-CM | POA: Diagnosis not present

## 2014-11-22 DIAGNOSIS — C801 Malignant (primary) neoplasm, unspecified: Secondary | ICD-10-CM | POA: Diagnosis not present

## 2014-11-23 ENCOUNTER — Ambulatory Visit: Payer: Medicare Other | Admitting: Radiation Oncology

## 2014-11-23 ENCOUNTER — Ambulatory Visit
Admission: RE | Admit: 2014-11-23 | Discharge: 2014-11-23 | Disposition: A | Discharge status: Home / Self Care | Payer: Medicare Other | Source: Ambulatory Visit | Attending: Radiation Oncology | Admitting: Radiation Oncology

## 2014-11-23 DIAGNOSIS — C50912 Malignant neoplasm of unspecified site of left female breast: Secondary | ICD-10-CM | POA: Diagnosis not present

## 2014-11-23 DIAGNOSIS — C7951 Secondary malignant neoplasm of bone: Secondary | ICD-10-CM | POA: Diagnosis not present

## 2014-11-23 DIAGNOSIS — C799 Secondary malignant neoplasm of unspecified site: Secondary | ICD-10-CM | POA: Diagnosis not present

## 2014-11-23 DIAGNOSIS — C801 Malignant (primary) neoplasm, unspecified: Secondary | ICD-10-CM | POA: Diagnosis not present

## 2014-11-24 ENCOUNTER — Ambulatory Visit
Admission: RE | Admit: 2014-11-24 | Discharge: 2014-11-24 | Disposition: A | Discharge status: Home / Self Care | Payer: Medicare Other | Source: Ambulatory Visit | Attending: Radiation Oncology | Admitting: Radiation Oncology

## 2014-11-24 ENCOUNTER — Ambulatory Visit: Payer: Medicare Other

## 2014-11-24 DIAGNOSIS — C801 Malignant (primary) neoplasm, unspecified: Secondary | ICD-10-CM | POA: Diagnosis not present

## 2014-11-24 DIAGNOSIS — C7951 Secondary malignant neoplasm of bone: Secondary | ICD-10-CM | POA: Diagnosis not present

## 2014-11-24 DIAGNOSIS — C799 Secondary malignant neoplasm of unspecified site: Secondary | ICD-10-CM | POA: Diagnosis not present

## 2014-11-24 DIAGNOSIS — C50912 Malignant neoplasm of unspecified site of left female breast: Secondary | ICD-10-CM | POA: Diagnosis not present

## 2014-11-28 ENCOUNTER — Ambulatory Visit
Admission: RE | Admit: 2014-11-28 | Discharge: 2014-11-28 | Disposition: A | Discharge status: Home / Self Care | Payer: Medicare Other | Source: Ambulatory Visit | Attending: Radiation Oncology | Admitting: Radiation Oncology

## 2014-11-28 ENCOUNTER — Ambulatory Visit: Payer: Medicare Other

## 2014-11-28 ENCOUNTER — Encounter: Payer: Self-pay | Admitting: Oncology

## 2014-11-28 ENCOUNTER — Encounter: Payer: Self-pay | Admitting: Radiation Oncology

## 2014-11-28 VITALS — BP 134/66 | HR 111 | Resp 16 | Wt 120.1 lb

## 2014-11-28 DIAGNOSIS — C50912 Malignant neoplasm of unspecified site of left female breast: Secondary | ICD-10-CM | POA: Diagnosis not present

## 2014-11-28 DIAGNOSIS — C801 Malignant (primary) neoplasm, unspecified: Secondary | ICD-10-CM | POA: Diagnosis not present

## 2014-11-28 DIAGNOSIS — C799 Secondary malignant neoplasm of unspecified site: Secondary | ICD-10-CM | POA: Diagnosis not present

## 2014-11-28 DIAGNOSIS — C7951 Secondary malignant neoplasm of bone: Secondary | ICD-10-CM | POA: Diagnosis not present

## 2014-11-28 NOTE — Progress Notes (Signed)
Weight and vitals stable. Denies pain. Denies numbness or tingling in extremities. Reports numbness in upper back and shoulders continue. Reports weakness of right arm.   BP 134/66 mmHg  Pulse 111  Resp 16  Wt 120 lb 1.6 oz (54.477 kg) Wt Readings from Last 3 Encounters:  11/28/14 120 lb 1.6 oz (54.477 kg)  11/15/14 119 lb 4.8 oz (54.114 kg)  11/06/14 117 lb 14.4 oz (53.479 kg)

## 2014-11-28 NOTE — Progress Notes (Signed)
I placed form from [employer] for patient pick up. Her signature is needed.

## 2014-11-28 NOTE — Progress Notes (Signed)
Weekly Management Note Current Dose:  7.5 Gy  Projected Dose: 30 Gy   Narrative:  The patient presents for routine under treatment assessment.  CBCT/MVCT images/Port film x-rays were reviewed.  The chart was checked. Doing well. Stitch still irritation in back. Sees Ecologist. Questions about monitoring/scans. Taking letrozole.   Physical Findings: Weight: 120 lb 1.6 oz (54.477 kg). Unchanged  Impression:  The patient is tolerating radiation.  Plan:  Continue treatment as planned. Discussed no imaging of back routinely. Possibly for symptoms and as per Dr. Jana Hakim to assess letrozole response. Discussed not treatment of left breast at this time as well.

## 2014-11-29 ENCOUNTER — Ambulatory Visit
Admission: RE | Admit: 2014-11-29 | Discharge: 2014-11-29 | Disposition: A | Discharge status: Home / Self Care | Payer: Medicare Other | Source: Ambulatory Visit | Attending: Radiation Oncology | Admitting: Radiation Oncology

## 2014-11-29 ENCOUNTER — Ambulatory Visit: Payer: Medicare Other

## 2014-11-29 DIAGNOSIS — C50912 Malignant neoplasm of unspecified site of left female breast: Secondary | ICD-10-CM | POA: Diagnosis not present

## 2014-11-29 DIAGNOSIS — C7951 Secondary malignant neoplasm of bone: Secondary | ICD-10-CM | POA: Diagnosis not present

## 2014-11-29 DIAGNOSIS — C801 Malignant (primary) neoplasm, unspecified: Secondary | ICD-10-CM | POA: Diagnosis not present

## 2014-11-29 DIAGNOSIS — R030 Elevated blood-pressure reading, without diagnosis of hypertension: Secondary | ICD-10-CM | POA: Diagnosis not present

## 2014-11-29 DIAGNOSIS — C799 Secondary malignant neoplasm of unspecified site: Secondary | ICD-10-CM | POA: Diagnosis not present

## 2014-11-30 ENCOUNTER — Ambulatory Visit
Admission: RE | Admit: 2014-11-30 | Discharge: 2014-11-30 | Disposition: A | Discharge status: Home / Self Care | Payer: Medicare Other | Source: Ambulatory Visit | Attending: Radiation Oncology | Admitting: Radiation Oncology

## 2014-11-30 DIAGNOSIS — C50912 Malignant neoplasm of unspecified site of left female breast: Secondary | ICD-10-CM | POA: Diagnosis not present

## 2014-11-30 DIAGNOSIS — C799 Secondary malignant neoplasm of unspecified site: Secondary | ICD-10-CM | POA: Diagnosis not present

## 2014-11-30 DIAGNOSIS — C7951 Secondary malignant neoplasm of bone: Secondary | ICD-10-CM | POA: Diagnosis not present

## 2014-11-30 DIAGNOSIS — C801 Malignant (primary) neoplasm, unspecified: Secondary | ICD-10-CM | POA: Diagnosis not present

## 2014-12-01 ENCOUNTER — Ambulatory Visit
Admission: RE | Admit: 2014-12-01 | Discharge: 2014-12-01 | Disposition: A | Discharge status: Home / Self Care | Payer: Medicare Other | Source: Ambulatory Visit | Attending: Radiation Oncology | Admitting: Radiation Oncology

## 2014-12-01 DIAGNOSIS — C7951 Secondary malignant neoplasm of bone: Secondary | ICD-10-CM | POA: Diagnosis not present

## 2014-12-01 DIAGNOSIS — C801 Malignant (primary) neoplasm, unspecified: Secondary | ICD-10-CM | POA: Diagnosis not present

## 2014-12-01 DIAGNOSIS — C799 Secondary malignant neoplasm of unspecified site: Secondary | ICD-10-CM | POA: Diagnosis not present

## 2014-12-01 DIAGNOSIS — C50912 Malignant neoplasm of unspecified site of left female breast: Secondary | ICD-10-CM | POA: Diagnosis not present

## 2014-12-03 ENCOUNTER — Other Ambulatory Visit: Payer: Self-pay | Admitting: Oncology

## 2014-12-03 NOTE — Progress Notes (Unsigned)
Patient called and canceled Zometa for 07/20. Patient does not want to do the treatment.    Thank you

## 2014-12-04 ENCOUNTER — Ambulatory Visit
Admission: RE | Admit: 2014-12-04 | Discharge: 2014-12-04 | Disposition: A | Discharge status: Home / Self Care | Payer: Medicare Other | Source: Ambulatory Visit | Attending: Radiation Oncology | Admitting: Radiation Oncology

## 2014-12-04 DIAGNOSIS — C50919 Malignant neoplasm of unspecified site of unspecified female breast: Secondary | ICD-10-CM | POA: Insufficient documentation

## 2014-12-04 DIAGNOSIS — C799 Secondary malignant neoplasm of unspecified site: Secondary | ICD-10-CM | POA: Diagnosis not present

## 2014-12-04 DIAGNOSIS — C801 Malignant (primary) neoplasm, unspecified: Secondary | ICD-10-CM | POA: Diagnosis not present

## 2014-12-04 DIAGNOSIS — C7951 Secondary malignant neoplasm of bone: Secondary | ICD-10-CM | POA: Diagnosis not present

## 2014-12-04 DIAGNOSIS — C50912 Malignant neoplasm of unspecified site of left female breast: Secondary | ICD-10-CM | POA: Diagnosis not present

## 2014-12-05 ENCOUNTER — Ambulatory Visit
Admission: RE | Admit: 2014-12-05 | Discharge: 2014-12-05 | Disposition: A | Discharge status: Home / Self Care | Payer: Medicare Other | Source: Ambulatory Visit | Attending: Radiation Oncology | Admitting: Radiation Oncology

## 2014-12-05 DIAGNOSIS — C50919 Malignant neoplasm of unspecified site of unspecified female breast: Secondary | ICD-10-CM | POA: Diagnosis not present

## 2014-12-05 DIAGNOSIS — C7951 Secondary malignant neoplasm of bone: Secondary | ICD-10-CM | POA: Diagnosis not present

## 2014-12-05 DIAGNOSIS — C799 Secondary malignant neoplasm of unspecified site: Secondary | ICD-10-CM | POA: Diagnosis not present

## 2014-12-05 DIAGNOSIS — C801 Malignant (primary) neoplasm, unspecified: Secondary | ICD-10-CM | POA: Diagnosis not present

## 2014-12-05 MED ORDER — SUCRALFATE 1 G PO TABS: ORAL_TABLET | ORAL | Status: DC

## 2014-12-05 NOTE — Progress Notes (Signed)
Weekly Management Note Current Dose: 17.5  Gy  Projected Dose: 30 Gy   Narrative:  The patient presents for routine under treatment assessment.  CBCT/MVCT images/Port film x-rays were reviewed.  The chart was checked. Some dysphagia. "feels like food gets stuck and wants to come back up" Some bleeding on neck after stitch removed. Numb on back. Rash on back. Not itching.   Physical Findings: Alert and oriented. Rash on back.   Impression:  The patient is tolerating radiation.  Plan:  Continue treatment as planned. Carafate for swallowing. Instructions given.  biafene to back.

## 2014-12-06 ENCOUNTER — Ambulatory Visit: Payer: Self-pay | Admitting: Internal Medicine

## 2014-12-06 ENCOUNTER — Ambulatory Visit
Admission: RE | Admit: 2014-12-06 | Discharge: 2014-12-06 | Disposition: A | Discharge status: Home / Self Care | Payer: Medicare Other | Source: Ambulatory Visit | Attending: Radiation Oncology | Admitting: Radiation Oncology

## 2014-12-06 DIAGNOSIS — C50919 Malignant neoplasm of unspecified site of unspecified female breast: Secondary | ICD-10-CM | POA: Diagnosis not present

## 2014-12-06 DIAGNOSIS — C799 Secondary malignant neoplasm of unspecified site: Secondary | ICD-10-CM | POA: Diagnosis not present

## 2014-12-06 DIAGNOSIS — C7951 Secondary malignant neoplasm of bone: Secondary | ICD-10-CM | POA: Diagnosis not present

## 2014-12-06 DIAGNOSIS — C801 Malignant (primary) neoplasm, unspecified: Secondary | ICD-10-CM | POA: Diagnosis not present

## 2014-12-06 MED ORDER — BIAFINE EX EMUL
CUTANEOUS | Status: DC | PRN
  Administered 2014-12-06: 10:00:00 via TOPICAL

## 2014-12-07 ENCOUNTER — Ambulatory Visit
Admission: RE | Admit: 2014-12-07 | Discharge: 2014-12-07 | Disposition: A | Discharge status: Home / Self Care | Payer: Medicare Other | Source: Ambulatory Visit | Attending: Radiation Oncology | Admitting: Radiation Oncology

## 2014-12-07 DIAGNOSIS — C799 Secondary malignant neoplasm of unspecified site: Secondary | ICD-10-CM | POA: Diagnosis not present

## 2014-12-07 DIAGNOSIS — C7951 Secondary malignant neoplasm of bone: Secondary | ICD-10-CM | POA: Diagnosis not present

## 2014-12-07 DIAGNOSIS — C50919 Malignant neoplasm of unspecified site of unspecified female breast: Secondary | ICD-10-CM | POA: Diagnosis not present

## 2014-12-07 DIAGNOSIS — C801 Malignant (primary) neoplasm, unspecified: Secondary | ICD-10-CM | POA: Diagnosis not present

## 2014-12-08 ENCOUNTER — Ambulatory Visit
Admission: RE | Admit: 2014-12-08 | Discharge: 2014-12-08 | Disposition: A | Discharge status: Home / Self Care | Payer: Medicare Other | Source: Ambulatory Visit | Attending: Radiation Oncology | Admitting: Radiation Oncology

## 2014-12-08 DIAGNOSIS — C801 Malignant (primary) neoplasm, unspecified: Secondary | ICD-10-CM | POA: Diagnosis not present

## 2014-12-08 DIAGNOSIS — C50919 Malignant neoplasm of unspecified site of unspecified female breast: Secondary | ICD-10-CM | POA: Diagnosis not present

## 2014-12-08 DIAGNOSIS — C799 Secondary malignant neoplasm of unspecified site: Secondary | ICD-10-CM | POA: Diagnosis not present

## 2014-12-08 DIAGNOSIS — C7951 Secondary malignant neoplasm of bone: Secondary | ICD-10-CM | POA: Diagnosis not present

## 2014-12-11 ENCOUNTER — Other Ambulatory Visit (HOSPITAL_BASED_OUTPATIENT_CLINIC_OR_DEPARTMENT_OTHER): Payer: Medicare Other

## 2014-12-11 ENCOUNTER — Ambulatory Visit
Admission: RE | Admit: 2014-12-11 | Discharge: 2014-12-11 | Disposition: A | Discharge status: Home / Self Care | Payer: Medicare Other | Source: Ambulatory Visit | Attending: Radiation Oncology | Admitting: Radiation Oncology

## 2014-12-11 ENCOUNTER — Encounter: Payer: Self-pay | Admitting: Radiation Oncology

## 2014-12-11 ENCOUNTER — Ambulatory Visit: Payer: Medicare Other

## 2014-12-11 DIAGNOSIS — C50212 Malignant neoplasm of upper-inner quadrant of left female breast: Secondary | ICD-10-CM

## 2014-12-11 DIAGNOSIS — C7951 Secondary malignant neoplasm of bone: Secondary | ICD-10-CM | POA: Diagnosis not present

## 2014-12-11 DIAGNOSIS — C801 Malignant (primary) neoplasm, unspecified: Secondary | ICD-10-CM | POA: Diagnosis not present

## 2014-12-11 DIAGNOSIS — C50912 Malignant neoplasm of unspecified site of left female breast: Secondary | ICD-10-CM

## 2014-12-11 DIAGNOSIS — C50919 Malignant neoplasm of unspecified site of unspecified female breast: Secondary | ICD-10-CM | POA: Diagnosis not present

## 2014-12-11 DIAGNOSIS — C799 Secondary malignant neoplasm of unspecified site: Secondary | ICD-10-CM | POA: Diagnosis not present

## 2014-12-11 LAB — COMPREHENSIVE METABOLIC PANEL (CC13)
ALBUMIN: 3.5 g/dL (ref 3.5–5.0)
ALK PHOS: 50 U/L (ref 40–150)
ALT: 14 U/L (ref 0–55)
AST: 19 U/L (ref 5–34)
Anion Gap: 7 mEq/L (ref 3–11)
BUN: 16.1 mg/dL (ref 7.0–26.0)
CHLORIDE: 105 meq/L (ref 98–109)
CO2: 30 mEq/L — ABNORMAL HIGH (ref 22–29)
Calcium: 10.5 mg/dL — ABNORMAL HIGH (ref 8.4–10.4)
Creatinine: 0.7 mg/dL (ref 0.6–1.1)
EGFR: 82 mL/min/{1.73_m2} — ABNORMAL LOW (ref >90)
Glucose: 110 mg/dl (ref 70–140)
Potassium: 5.5 mEq/L — ABNORMAL HIGH (ref 3.5–5.1)
Sodium: 141 mEq/L (ref 136–145)
TOTAL PROTEIN: 6.8 g/dL (ref 6.4–8.3)
Total Bilirubin: 0.34 mg/dL (ref 0.20–1.20)

## 2014-12-11 LAB — CBC WITH DIFFERENTIAL/PLATELET
BASO%: 1 % (ref 0.0–2.0)
Basophils Absolute: 0 10*3/uL (ref 0.0–0.1)
EOS ABS: 0.5 10*3/uL (ref 0.0–0.5)
EOS%: 9.9 % — ABNORMAL HIGH (ref 0.0–7.0)
HCT: 41.2 % (ref 34.8–46.6)
HGB: 13.7 g/dL (ref 11.6–15.9)
LYMPH%: 15.1 % (ref 14.0–49.7)
MCH: 30.2 pg (ref 25.1–34.0)
MCHC: 33.2 g/dL (ref 31.5–36.0)
MCV: 91.2 fL (ref 79.5–101.0)
MONO#: 0.5 10*3/uL (ref 0.1–0.9)
MONO%: 11 % (ref 0.0–14.0)
NEUT#: 3 10*3/uL (ref 1.5–6.5)
NEUT%: 63 % (ref 38.4–76.8)
PLATELETS: 260 10*3/uL (ref 145–400)
RBC: 4.52 10*6/uL (ref 3.70–5.45)
RDW: 14.4 % (ref 11.2–14.5)
WBC: 4.8 10*3/uL (ref 3.9–10.3)
lymph#: 0.7 10*3/uL — ABNORMAL LOW (ref 0.9–3.3)

## 2014-12-11 NOTE — Progress Notes (Signed)
Patient has completed 12 of 12 fractions to Spine T1-T5.Has some difficulty with food passing through esophagus.Hasn't started carafate yet.Informed to start carafate and take prior to breakfast,lunch and dinner.Told to avoid taking  tums with carafate  but may take 2 hours later.Reinforced when to apply radiaplex 2 to 3 times daily.She has mild redness anteriorly and posteriorly with mild rash on back.Escorted to front office to schedule follow up in one month.

## 2014-12-12 ENCOUNTER — Ambulatory Visit: Payer: Medicare Other

## 2014-12-13 ENCOUNTER — Ambulatory Visit: Payer: Medicare Other

## 2014-12-19 NOTE — Progress Notes (Signed)
  Radiation Oncology         [phone] ________________________________  Name: April Rosales MRN: [id number]  Date: 12/11/2014  DOB: 03-03-1941  End of Treatment Note  Diagnosis:   Breast cancer metastasized to multiple sites   Staging form: Breast, AJCC 7th Edition     Clinical: Stage IV ([place], NX, M1) - Signed by Chauncey Cruel, MD on 08/31/2014  Indication for treatment:  Palliative       Radiation treatment dates:   11/23/2014-12/11/2014  Site/dose:   T1-T5 was treated to 30 Gy in 12 fractions at 2.5 Gy per fraction  Beams/energy:   A posterior wedge pair was used with 10 MV photons  Narrative: The patient tolerated radiation treatment relatively well.   She experienced the expcted dysphagia and some skin irritation posteriorly as well.   Plan: The patient has completed radiation treatment. The patient will return to radiation oncology clinic for routine followup in one month. I advised them to call or return sooner if they have any questions or concerns related to their recovery or treatment.  ------------------------------------------------  Thea Silversmith, MD

## 2014-12-24 NOTE — Progress Notes (Signed)
Radiation Oncology         [phone] ________________________________  Name: April Rosales      MRN: [id number]          Date: 11/16/14            DOB: 1940/06/23  Optical Surface Tracking Plan:  Since intensity modulated radiotherapy (IMRT) and 3D conformal radiation treatment methods are predicated on accurate and precise positioning for treatment, intrafraction motion monitoring is medically necessary to ensure accurate and safe treatment delivery.  The ability to quantify intrafraction motion without excessive ionizing radiation dose can only be performed with optical surface tracking. Accordingly, surface imaging offers the opportunity to obtain 3D measurements of patient position throughout IMRT and 3D treatments without excessive radiation exposure.  I am ordering optical surface tracking for this patient's upcoming course of radiotherapy. ________________________________ Signature   Reference:   Ursula Alert, J, et al. Surface imaging-based analysis of intrafraction motion for breast radiotherapy patients.Journal of [employer], n. 6, nov. 2014. ISSN [id number].   Available at: <http://www.jacmp.org/index.php/jacmp/article/view/4957>.

## 2015-01-05 DIAGNOSIS — C7951 Secondary malignant neoplasm of bone: Secondary | ICD-10-CM | POA: Diagnosis not present

## 2015-01-12 ENCOUNTER — Ambulatory Visit: Payer: Medicare Other | Admitting: Radiation Oncology

## 2015-01-18 ENCOUNTER — Ambulatory Visit
Admission: RE | Admit: 2015-01-18 | Discharge: 2015-01-18 | Disposition: A | Discharge status: Home / Self Care | Payer: Medicare Other | Source: Ambulatory Visit | Attending: Radiation Oncology | Admitting: Radiation Oncology

## 2015-01-18 VITALS — BP 128/72 | HR 92 | Temp 98.3°F | Wt 119.3 lb

## 2015-01-18 DIAGNOSIS — C50919 Malignant neoplasm of unspecified site of unspecified female breast: Secondary | ICD-10-CM

## 2015-01-18 DIAGNOSIS — C7951 Secondary malignant neoplasm of bone: Secondary | ICD-10-CM

## 2015-01-18 NOTE — Progress Notes (Signed)
   Department of Radiation Oncology  Phone:  [phone] Fax:        [phone]   Name: April Rosales MRN: [id number]  DOB: 12/18/1940  Date: 01/18/2015  Follow Up Visit Note  Diagnosis: Breast cancer metastasized to multiple sites   Staging form: Breast, AJCC 7th Edition     Clinical: Stage IV ([place], NX, M1) - Signed by Chauncey Cruel, MD on 08/31/2014  Summary and Interval since last radiation: T1-T5 was treated to 30 Gy in 12 fractions at 2.5 Gy per fraction 11/23/2014-12/11/2014.          RT to the right breast at [facility] in 1985.       Interval History: April Rosales presents today for routine followup. She feels good. She still has numbness in her back. She feels weak in her back and feels her posture is worsening (kyphosis).  She is still taking Letrozole and had a few errant stitches removed from her back incision.   Physical Exam:  Filed Vitals:   01/18/15 1453  BP: 128/72  Pulse: 92  Temp: 98.3 F (36.8 C)  Weight: 119 lb 4.8 oz (54.114 kg)  SpO2: 97%  Alert and oriented times three. In no distress. Skin on her back is well healed. She does have lymphedema of the right arm. She is kyphotic.   IMPRESSION: April Rosales is a 74 y.o. female with Stage IV breast cancer that has metastasized to multiple sites.  PLAN: We had a long discussion regarding routine imaging in patients with metastatic breast cancer. I discussed that routine imaging is not usually indicated in metastatic patients. Instead, we would be monitoring her symptoms and basing our imaging on any change in these. I will follow up with her on an as needed basis. I have made a referral to physical therapy in order to strengthen her arms and back. They may also be able to help with her chronic right lymphedema.  She is scheduled to see Dr. Jana Hakim on 01/25/15.  This document serves as a record of services personally performed by Thea Silversmith, MD. It was created on her behalf by Darcus Austin, a trained medical scribe.  The creation of this record is based on the scribe's personal observations and the provider's statements to them. This document has been checked and approved by the attending provider.   Thea Silversmith, MD

## 2015-01-23 ENCOUNTER — Telehealth: Payer: Self-pay | Admitting: *Deleted

## 2015-01-23 NOTE — Telephone Encounter (Signed)
Called patient to inform of PT appt. On 01-24-15 @ 10:15 am @ [facility], spoke with patient and she is aware of this appt.

## 2015-01-24 ENCOUNTER — Ambulatory Visit: Payer: Medicare Other | Attending: Radiation Oncology | Admitting: Physical Therapy

## 2015-01-24 DIAGNOSIS — R293 Abnormal posture: Secondary | ICD-10-CM | POA: Diagnosis not present

## 2015-01-24 NOTE — Therapy (Signed)
[place], [place], [address] Phone: [phone]   Fax:  [phone]  Physical Therapy Evaluation  Patient Details  Name: April Rosales MRN: [id number] Date of Birth: [birth year] Referring Provider:  Thea Silversmith, MD  Encounter Date: 01/24/2015      PT End of Session - 01/24/15 1334    Visit Number 1   Number of Visits 1   PT Start Time 1015   PT Stop Time 1100   PT Time Calculation (min) 45 min   Activity Tolerance Patient tolerated treatment well   Behavior During Therapy [facility] for tasks assessed/performed      Past Medical History  Diagnosis Date  . Osteoporosis     takes Vit D  . Family history of ovarian cancer   . Breast cancer 1985/2016    takes [name] daily  . Arthritis   . Chronic back pain     stenosis/listhesis  . Radiation 11/23/14-12/11/14    30 Gy T1-T5     Past Surgical History  Procedure Laterality Date  . Appendectomy  1977  . Abdominal hysterectomy      1980  . Thyroid cyst excision  1967  . Breast surgery Right 1985  . Application of intraoperative ct scan N/A 10/20/2014    Procedure: APPLICATION OF INTRAOPERATIVE CAT SCAN;  Surgeon: [name], MD;  Location: [place] [facility];  Service: Neurosurgery;  Laterality: N/A;    There were no vitals filed for this visit.  Visit Diagnosis:  Posture abnormality      Subjective Assessment - 01/24/15 1325    Subjective Pt states she is not sure why she is here.  She thinks she is doing fine at home    Pertinent History breast cancer in 1985 with ALND with mild lymphedema in right arm that does not bother her, metastasis to bone in 2016 with surgical decompression to T3 metastatic tumor with C7-T4 fusion with pins 10/20/2014.  she has just completed radiation    Patient Stated Goals to be able to do what she wants to at home.    Currently in Pain? No/denies  intermittent pain at left shoulder none at this time            [facility] PT Assessment - 01/24/15 0001    Assessment   Medical Diagnosis stage IV breast cancer   Onset Date/Surgical Date 10/20/14   Precautions   Precautions Other (comment)  cancer, no scapular protraction or retraction   Restrictions   Weight Bearing Restrictions No   Balance Screen   Has the patient fallen in the past 6 months No   Has the patient had a decrease in activity level because of a fear of falling?  No   Is the patient reluctant to leave their home because of a fear of falling?  No   Home Social worker Private residence   Living Arrangements Alone   Available Help at Discharge Available PRN/intermittently;Personal care attendant   Type of Home House   Prior Function   Level of Independence Independent with basic ADLs   Cognition   Overall Cognitive Status Within Functional Limits for tasks assessed   Observation/Other Assessments   Observations thoracic kyphosis with surgical scar in central upper back    Sensation   Light Touch Not tested   Coordination   Gross Motor Movements are Fluid and Coordinated Yes   Posture/Postural Control   Posture/Postural Control Postural limitations   Postural Limitations Rounded  Shoulders;Forward head;Increased thoracic kyphosis              Katina Dung - 2015/02/18 0001    Open a tight or new jar No difficulty   Do heavy household chores (wash walls, wash floors) Moderate difficulty   Carry a shopping bag or briefcase No difficulty   Wash your back No difficulty   Use a knife to cut food No difficulty   Recreational activities in which you take some force or impact through your arm, shoulder, or hand (golf, hammering, tennis) Moderate difficulty   During the past week, to what extent has your arm, shoulder or hand problem interfered with your normal social activities with family, friends, neighbors, or groups? Not at all   During the past week, to what extent has your arm, shoulder or hand problem  limited your work or other regular daily activities Slightly   Arm, shoulder, or hand pain. Mild   Tingling (pins and needles) in your arm, shoulder, or hand None   Difficulty Sleeping No difficulty   DASH Score 13.64 %             [facility] Adult PT Treatment/Exercise - 02-18-15 0001    Lumbar Exercises: Supine   Other Supine Lumbar Exercises mini marches with core engaged   Other Supine Lumbar Exercises meeks decompressin series with head supported on pillow and towel, all within painfree range   Knee/Hip Exercises: Standing   Other Standing Knee Exercises repeated sit to stand                PT Education - 18-Feb-2015 1334    Education provided Yes   Education Details meeks decompression exercise and supine core activation,    Person(s) Educated Patient   Methods Explanation;Demonstration;Handout   Comprehension Verbalized understanding;Returned demonstration                [facility] Goals - 2015-02-18 1340    CC Long Term Goal  #1   Title Patient will be independent in a home exercise program   Time 1   Period Days   Status Achieved            Plan - February 18, 2015 1335    Clinical Impression Statement Pt intially stated she was not interested in exercise, but did participate and learn  spinal decompression exercise with head supported on pillow/towel to fill in the space all within painfree range to halt the progression of kyphosis.  She also agreed to do core activation and Lower extremity strengthening throughout her day.  She feels that she can do these execises at home and does not need to come back for futher treatment.  I gave her my card and encouraged her to call with any questions.    Pt will benefit from skilled therapeutic intervention in order to improve on the following deficits Postural dysfunction   Rehab Potential Good   Clinical Impairments Affecting Rehab Potential pt reports Dr. Hal Neer does not want her to protract or retract her  scapulae   PT Frequency One time visit   PT Treatment/Interventions Therapeutic exercise;ADLs/Self Care Home Management   PT Home Exercise Plan meeds decompression exercise, supine lower abdominal work, repeated sit to stand , step and down a step small bouts of exercise throughout her day.   Consulted and Agree with Plan of Care Patient          G-Codes - 18-Feb-2015 1341    Functional Assessment Tool Used quick dash  Functional Limitation Carrying, moving and handling objects   Carrying, Moving and Handling Objects Current Status [phone]) At least 1 percent but less than 20 percent impaired, limited or restricted   Carrying, Moving and Handling Objects Goal Status ([id number]) At least 1 percent but less than 20 percent impaired, limited or restricted   Carrying, Moving and Handling Objects Discharge Status [phone]) At least 1 percent but less than 20 percent impaired, limited or restricted       Problem List Patient Active Problem List   Diagnosis Date Noted  . Metastasis to bone 10/20/2014  . Genetic testing 10/19/2014  . Family history of breast cancer   . Family history of ovarian cancer   . Breast cancer metastasized to multiple sites 08/28/2014  . 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 Medication management 07/07/2013  . Labile hypertension   . Hyperlipidemia   . Prediabetes   . Vitamin D deficiency   . Osteoporosis   . IBS (irritable bowel syndrome)    Helene Kelp K. Owens Shark, PT  01/24/2015, 1:48 PM  [place], [place], [address] Phone: [phone]   Fax:  [phone]

## 2015-01-25 ENCOUNTER — Encounter: Payer: Self-pay | Admitting: Oncology

## 2015-01-25 ENCOUNTER — Telehealth: Payer: Self-pay | Admitting: Oncology

## 2015-01-25 ENCOUNTER — Ambulatory Visit (HOSPITAL_BASED_OUTPATIENT_CLINIC_OR_DEPARTMENT_OTHER): Payer: Medicare Other | Admitting: Oncology

## 2015-01-25 VITALS — BP 142/75 | HR 101 | Temp 98.0°F | Resp 16 | Ht 62.0 in | Wt 119.3 lb

## 2015-01-25 DIAGNOSIS — C50919 Malignant neoplasm of unspecified site of unspecified female breast: Secondary | ICD-10-CM

## 2015-01-25 DIAGNOSIS — C751 Malignant neoplasm of pituitary gland: Secondary | ICD-10-CM

## 2015-01-25 DIAGNOSIS — C50212 Malignant neoplasm of upper-inner quadrant of left female breast: Secondary | ICD-10-CM | POA: Diagnosis not present

## 2015-01-25 DIAGNOSIS — C7951 Secondary malignant neoplasm of bone: Secondary | ICD-10-CM

## 2015-01-25 MED ORDER — PALBOCICLIB 75 MG PO CAPS: 75.0000 mg | ORAL_CAPSULE | Freq: Every day | ORAL | Status: DC

## 2015-01-25 NOTE — Progress Notes (Signed)
Questionnaire submitted. PA Case [id number] Status: Approved. Authorization and Notifications Pending. ibrance prior auth

## 2015-01-25 NOTE — Progress Notes (Signed)
[name]  [phone] [phone]     ID: [name] DOB: Sep 15, 1940  MR#: [id number]  XIH#:[id number]  Patient Care Team: [name], MD as PCP - General (Internal Medicine) Chauncey Cruel, MD as Consulting Physician (Oncology) PCP: [name], MD GYN: SU: [name] M.D. OTHER MD:  Rodell Perna M.D., [name] MD, Stephannie Peters M.D.  CHIEF COMPLAINT: Stage IV breast cancer  CURRENT TREATMENT:  Letrozole; denosumab; palbociclib   BREAST CANCER HISTORY: From the original intake note:    April Rosales underwent right lumpectomy in 1985 at [facility] for what sounds like a stage I breast cancer. She tells me she had more than 30 lymph nodes removed from her right axilla and all of them were clear. She received  adjuvant radiation but no systemic treatment.  The patient had recently refused mammography with the last mammogram I can find dating back to August 2010.  More recently the patient presented with left scapular pain radiating down the left arm.  She was evaluated by Dr. [name], who obtained a chest x-ray showing a possible abnormality at T3. He then set up the patient for an MRI of the thoracic spine performed 08/16/2014.  This showed multiple compression fractures  (a similar picture had been noted on lumbar MRI 10/09/2011 ). However at T3 they noted tumor in the vertebral body extending into the left pedicle and into the lateral epidural space, displacing the cord to the right. There was no evidence of cord compression or cord signal abnormality. There were no other areas of tumor identified in the thoracic spine.         The patient was then referred to Dr. Hal Neer who on 08/24/2014 set [name] up for CT scans of the chest, abdomen and pelvis. There was a dense mass in the upper inner quadrant of the left breast measuring 1.6 cm. There was a 1.2 cm nodule in the minor fissure of the right lung and some  evidence of right lung fibrosis at the site of the prior radiation port.  There was also a thyroid mass measuring 2 cm. However there were no parenchymal lung or liver lesions. Incidental meningoceles were noted as well as sclerosis of the fifth and sixth ribs which were felt to be likely posttraumatic.   On 08/29/2014 the patient underwent bilateral diagnostic mammography with tomosynthesis and left breast ultrasonography.  There were postsurgical changes in the upper right breast. In the left breast there was an irregular mass measuring 2.3 cm in the upper inner quadrant. This was palpable. Ultrasound showed this to be hypoechoic and to measure 2.0 cm. There were adjacent areas of nodularity. There was no definite lymphadenopathy in the left axilla.  Biopsy of this breast mass 08/29/2014 showed an invasive adenocarcinoma with both ductal and lobular features (there was strong diffuse E-cadherin expression as well as areas with total absence of E-cadherin expression), with the preliminary prognostic profile showing strong estrogen positivity, very weak to near absent progesterone positivity, an MIB-1 of approximately 40%, and HER-2 equivocal  The patient's subsequent history is as detailed below.  INTERVAL HISTORY:  April Rosales returns today for follow-up of breast cancer accompanied by her sister, April Rosales. Since her last visit here [name] completed her radiation treatments. She has some numbness on her back and some "burning" of the skin both back and front, but otherwise she tolerated the treatments well, and in particular fatigue was not a major issue. She did have some esophagitis which  has now resolved.  --She continues to tolerate the letrozole well, with no hot flashes or vaginal dryness problems. On the other hand she did poorly with the first zolendronate dose. She had a lot of bone aches and her teeth ached as well. She does not want to receive that anymore.  REVIEW OF SYSTEMS: She had  been somewhat foggy headed when I saw her last and we plan to do a brain MRI, with everything has cleared. She thinks the problem had been medication related. Her sister vouches for the fact that the patient is now back to baseline. [name] is eating well, has no unusual headaches, no visual changes, no nausea or vomiting, no dizziness or gait imbalance. She denies cough or phlegm production. She is not more short of breath than usual. She denies lower extremity weakness, paresthesias, or change in bowel or bladder habits. A detailed review of systems was otherwise stable  PAST MEDICAL HISTORY: Past Medical History  Diagnosis Date  . Osteoporosis     takes Vit D  . Family history of ovarian cancer   . Breast cancer 1985/2016    takes [name] daily  . Arthritis   . Chronic back pain     stenosis/listhesis  . Radiation 11/23/14-12/11/14    30 Gy T1-T5     PAST SURGICAL HISTORY: Past Surgical History  Procedure Laterality Date  . Appendectomy  1977  . Abdominal hysterectomy      1980  . Thyroid cyst excision  1967  . Breast surgery Right 1985  . Application of intraoperative ct scan N/A 10/20/2014    Procedure: APPLICATION OF INTRAOPERATIVE CAT SCAN;  Surgeon: [name], MD;  Location: [facility];  Service: Neurosurgery;  Laterality: N/A;    FAMILY HISTORY Family History  Problem Relation Age of Onset  . Heart disease Mother   . Hypertension Mother   . Heart disease Father   . Diabetes Father   . Breast cancer Paternal Aunt     4 paternal aunts with breast cancer over 41  . Prostate cancer Paternal Uncle   . Stroke Paternal Grandfather   . Ovarian cancer Paternal Aunt   . Huntington's disease Other     Nephew, inherited from his father   The patient's father died from a heart attack at the age of 26. He had 9 sisters. 3 of those sisters had breast cancer, all in a menopausal setting. Another sister had ovarian cancer. One of the paternal uncles had cancer of the colon "and  back".  The patient's mother died at the age of 80. She was found to have breast cancer shortly before dying , during her final hospitalization.  GYNECOLOGIC HISTORY:  No LMP recorded. Patient has had a hysterectomy.  Menarche age 35, first live birth age 61, the patient is GX P1. She underwent hysterectomy in 1980. She thinks the ovaries were removed, but the CT scan obtained 08/24/2014 showed a definite right ovary. The left ovary may have been removed. She did not take hormone replacement after the hysterectomy.  SOCIAL HISTORY:   Shaquia worked in Psychiatric nurse. She is divorced, lives alone, with 2 cats. Her son Arnette Norris sharp lives in [place] where he works in Engineer, technical sales. He has 4 children of his own. The patient is a Psychologist, forensic.    ADVANCED DIRECTIVES:  In place. The patient has named her sister Pleas Koch [phone]) and her friend Janina Mayo [phone]) as joint healthcare powers of attorney   HEALTH MAINTENANCE: Social History  Substance  Use Topics  . Smoking status: Former Smoker -- 0.50 packs/day for 10 years    Types: Cigarettes  . Smokeless tobacco: Former Systems developer     Comment: quit smoking 1970's  . Alcohol Use: 0.0 oz/week    0 Standard drinks or equivalent per week     Comment: Rare     Colonoscopy: never  PAP: status post hysterectomy  Bone density: 08/14/2014 at [facility], results pending  Lipid panel:  Allergies  Allergen Reactions  . Ativan [Lorazepam]     Made her crazy  . Dilaudid [Hydromorphone Hcl]     Doesn't want  . Haldol [Haloperidol Lactate]     Made her crazy    Current Outpatient Prescriptions  Medication Sig Dispense Refill  . Ascorbic Acid (VITAMIN C PO) Take 1 tablet by mouth every other day. Takes qod    . calcium carbonate (TUMS) 500 MG chewable tablet Chew 2 tablets by mouth 2 (two) times daily.     . Cholecalciferol (VITAMIN D3 ADULT GUMMIES PO) Take 4,000 Units by mouth daily.    . Cyanocobalamin (VITAMIN B-12 PO) Take by mouth  daily. Takes every 3 days    . Iron-Vitamins (GERITOL PO) Take by mouth daily.    Marland Kitchen letrozole (FEMARA) 2.5 MG tablet Take 1 tablet (2.5 mg total) by mouth daily. 90 tablet 12   No current facility-administered medications for this visit.    OBJECTIVE:  Older white woman in no acute distress Filed Vitals:   01/25/15 1225  BP: 142/75  Pulse: 101  Temp: 98 F (36.7 C)  Resp: 16     Body mass index is 21.81 kg/(m^2).    ECOG FS:1 - Symptomatic but completely ambulatory  Sclerae unicteric, pupils round and equal Oropharynx clear and moist-- no thrush or other lesions No cervical or supraclavicular adenopathy Lungs no rales or rhonchi Heart regular rate and rhythm Abd soft, nontender, positive bowel sounds MSK kyphosis but no focal spinal tenderness, no upper extremity lymphedema Neuro: nonfocal, well oriented, appropriate affect Breasts: The right breast is unremarkable. In the left breast upper inner quadrant there is still a palpable mass measuring approximately 2 cm. It is movable. There is no overlying erythema. There are no other skin or nipple changes of concern. The left axilla is benign.    LAB RESULTS:  CMP     Component Value Date/Time   NA 141 12/11/2014 1316   NA 138 10/20/2014 1317   K 5.5* 12/11/2014 1316   K 4.2 10/20/2014 1317   CL 103 10/13/2014 1427   CO2 30* 12/11/2014 1316   CO2 29 10/13/2014 1427   GLUCOSE 110 12/11/2014 1316   GLUCOSE 104* 10/13/2014 1427   BUN 16.1 12/11/2014 1316   BUN 10 10/13/2014 1427   CREATININE 0.7 12/11/2014 1316   CREATININE 0.56 10/13/2014 1427   CREATININE 0.59 08/21/2014 1713   CALCIUM 10.5* 12/11/2014 1316   CALCIUM 10.5* 10/13/2014 1427   PROT 6.8 12/11/2014 1316   PROT 6.7 08/21/2014 1713   ALBUMIN 3.5 12/11/2014 1316   ALBUMIN 4.2 08/21/2014 1713   AST 19 12/11/2014 1316   AST 18 08/21/2014 1713   ALT 14 12/11/2014 1316   ALT 13 08/21/2014 1713   ALKPHOS 50 12/11/2014 1316   ALKPHOS 53 08/21/2014 1713    BILITOT 0.34 12/11/2014 1316   BILITOT 0.4 08/21/2014 1713   GFRNONAA >60 10/13/2014 1427   GFRNONAA >89 08/21/2014 1713   GFRAA >60 10/13/2014 1427   GFRAA >89 08/21/2014 1713  INo results found for: SPEP, UPEP  Lab Results  Component Value Date   WBC 4.8 12/11/2014   NEUTROABS 3.0 12/11/2014   HGB 13.7 12/11/2014   HCT 41.2 12/11/2014   MCV 91.2 12/11/2014   PLT 260 12/11/2014      Chemistry      Component Value Date/Time   NA 141 12/11/2014 1316   NA 138 10/20/2014 1317   K 5.5* 12/11/2014 1316   K 4.2 10/20/2014 1317   CL 103 10/13/2014 1427   CO2 30* 12/11/2014 1316   CO2 29 10/13/2014 1427   BUN 16.1 12/11/2014 1316   BUN 10 10/13/2014 1427   CREATININE 0.7 12/11/2014 1316   CREATININE 0.56 10/13/2014 1427   CREATININE 0.59 08/21/2014 1713      Component Value Date/Time   CALCIUM 10.5* 12/11/2014 1316   CALCIUM 10.5* 10/13/2014 1427   ALKPHOS 50 12/11/2014 1316   ALKPHOS 53 08/21/2014 1713   AST 19 12/11/2014 1316   AST 18 08/21/2014 1713   ALT 14 12/11/2014 1316   ALT 13 08/21/2014 1713   BILITOT 0.34 12/11/2014 1316   BILITOT 0.4 08/21/2014 1713       Lab Results  Component Value Date   LABCA2 38 09/20/2014    No components found for: KDXIP382  No results for input(s): INR in the last 168 hours.  Urinalysis No results found for: COLORURINE, APPEARANCEUR, LABSPEC, PHURINE, GLUCOSEU, HGBUR, BILIRUBINUR, KETONESUR, PROTEINUR, UROBILINOGEN, NITRITE, LEUKOCYTESUR  STUDIES: No results found.  ASSESSMENT: 74 y.o. [place] woman with stage IV left breast cancer involving bone  (1) status post right lumpectomy and axillary node dissection in 1985 followed by radiation at [facility] 08/16/2014: measurable disease in spine, lung and left breast   (2) evaluation for left shoulder pain led to thoracic spine MRI 08/16/2014 showing a pathologic fracture at T3 with epidural tumor displacing the cord to the right, but no cord compression.  CT scans of the chest, abdomen and pelvis 08/24/2014 showed in addition a mass in the upper left breast measuring 1.6 cm and a nodule in the minor fissure of the right lung measuring 1.2 cm, but no parenchymal lung or liver lesions. CA 27-29 was noninformative at 38 (09/20/2014)    (3) mammography and ultrasonography 08/29/2014 show a mass in the upper inner left breast which was palpable,  measuring 2.0 cm by ultrasound. Biopsy of this mass 08/29/2014 showed an invasive breast cancer with both lobular and ductal features, estrogen receptor positive, progesterone receptor weakly positive, with an MIB-1 in the 40% range, HER-2 equivocal (6 else ratio 1.5, but average number her nucleus 5.8)   (4) letrozole started 08/31/2014; palbociclib to follow   (5)  zolendronate started 09/20/2014, rstopped after initial dose due to poor tolerance  (a) denosumab/ Xgeva started 01/31/2015, repeated every 4 weeks   (6) on 10/20/2014 the patient underwent T2-T3 and T4 decompressive laminectomy with removal of epidural tumor, C7-T4 segmental pedicle screw instrumentation with virage screw system with arrow guidance protocol and C7-T4 posterolateral fusion. The cells were positive for the estrogen receptor. HER-2/neu testing by [facility] showed again equivocal results,  (7) radiation 11/23/2014-12/11/2014.  (a) T1-T5 was treated to 30 Gy in 12 fractions at 2.5 Gy per fraction   (8)  consider eventual left lumpectomy or mastectomy depending on  longer-term results of systemic therapy  (9) genetics testing using the Breast/Ovarian Cancer Panel through [facility] [name], MD) found no mutations in ATM, BARD1, BRCA1, BRCA2, BRIP1, CDH1, CHEK2, EPCAM, FANCC,  MLH1, MSH2, MSH6, NBN, PALB2, PMS2, PTEN, RAD51C, RAD51D, STK11, TP53, or XRCC2    PLAN: Ryker tolerated her radiation treatments well. We are now ready to start Palbociclib.  We discussed the possible toxicities, side effects and cup occasions of that agent and she  understands in particular the concern regarding a drop in neutrophils. We're going to start checking her counts on a weekly basis beginning September 7. She will pick up the drug from our pharmacy. They will let us know if there is any problem related to cost  She did not tolerate the Zometa well so we are switching her to Nokomis. We also discussed the possible toxicities, side effects and complications of that agent. She will receive her first dose September 7, and continue every 4 weeks at least for 6 months after which we may broaden the treatment interval.  Her confusion has completely cleared. It may have been due to stress or 2 medications she received during her hospitalization earlier. I don't see any indication for a brain MRI right now so we are canceling that test.  On the other hand I do think we need to see how the breast mass is doing and I have set her up for an ultrasound of the left breast to be done sometime mid September. We will review that when she returns to see me October 5.  Very likely at the end of the year she will be completely restaged and if things are stable we can consider lumpectomy with no sentinel lymph node sampling sometime early 2017.  Jerni has a good understanding of this plan. She took careful notes and the information was also given to her in writing. She will return to see me early October. She will call with any problems that may develop before then  Chauncey Cruel, MD   01/25/2015 12:32 PM Medical Oncology and Hematology [facility] [place], [place] [address] Tel. [phone]    Fax. [phone]

## 2015-01-25 NOTE — Telephone Encounter (Signed)
Appointments made and avs printed for patient,inbox to dr Jana Hakim to order mammo as the  Breast center will not schedule the ultrasound without it.  Pt aware that i will call her

## 2015-01-26 ENCOUNTER — Encounter: Payer: Self-pay | Admitting: Oncology

## 2015-01-26 NOTE — Progress Notes (Signed)
See prev notes- Ibrance approved 01/25/15-01/24/17. I will send to medical records.

## 2015-01-31 ENCOUNTER — Ambulatory Visit (HOSPITAL_BASED_OUTPATIENT_CLINIC_OR_DEPARTMENT_OTHER): Payer: Medicare Other

## 2015-01-31 ENCOUNTER — Other Ambulatory Visit: Payer: Self-pay | Admitting: *Deleted

## 2015-01-31 ENCOUNTER — Other Ambulatory Visit (HOSPITAL_BASED_OUTPATIENT_CLINIC_OR_DEPARTMENT_OTHER): Payer: Medicare Other

## 2015-01-31 ENCOUNTER — Other Ambulatory Visit: Payer: Self-pay | Admitting: Oncology

## 2015-01-31 VITALS — BP 146/76 | HR 97 | Temp 98.5°F

## 2015-01-31 DIAGNOSIS — C50919 Malignant neoplasm of unspecified site of unspecified female breast: Secondary | ICD-10-CM

## 2015-01-31 DIAGNOSIS — C7951 Secondary malignant neoplasm of bone: Secondary | ICD-10-CM | POA: Diagnosis not present

## 2015-01-31 DIAGNOSIS — C50912 Malignant neoplasm of unspecified site of left female breast: Secondary | ICD-10-CM

## 2015-01-31 DIAGNOSIS — C50212 Malignant neoplasm of upper-inner quadrant of left female breast: Secondary | ICD-10-CM

## 2015-01-31 DIAGNOSIS — M810 Age-related osteoporosis without current pathological fracture: Secondary | ICD-10-CM | POA: Diagnosis present

## 2015-01-31 LAB — CBC WITH DIFFERENTIAL/PLATELET
BASO%: 0.7 % (ref 0.0–2.0)
Basophils Absolute: 0 10*3/uL (ref 0.0–0.1)
EOS%: 8.3 % — ABNORMAL HIGH (ref 0.0–7.0)
Eosinophils Absolute: 0.5 10*3/uL (ref 0.0–0.5)
HCT: 42 % (ref 34.8–46.6)
HEMOGLOBIN: 14 g/dL (ref 11.6–15.9)
LYMPH#: 1.1 10*3/uL (ref 0.9–3.3)
LYMPH%: 20.1 % (ref 14.0–49.7)
MCH: 30 pg (ref 25.1–34.0)
MCHC: 33.3 g/dL (ref 31.5–36.0)
MCV: 90.1 fL (ref 79.5–101.0)
MONO#: 0.5 10*3/uL (ref 0.1–0.9)
MONO%: 9.1 % (ref 0.0–14.0)
NEUT#: 3.3 10*3/uL (ref 1.5–6.5)
NEUT%: 61.8 % (ref 38.4–76.8)
PLATELETS: 223 10*3/uL (ref 145–400)
RBC: 4.66 10*6/uL (ref 3.70–5.45)
RDW: 14.2 % (ref 11.2–14.5)
WBC: 5.4 10*3/uL (ref 3.9–10.3)

## 2015-01-31 LAB — COMPREHENSIVE METABOLIC PANEL (CC13)
ALK PHOS: 44 U/L (ref 40–150)
ALT: 16 U/L (ref 0–55)
ANION GAP: 9 meq/L (ref 3–11)
AST: 22 U/L (ref 5–34)
Albumin: 3.6 g/dL (ref 3.5–5.0)
BUN: 16.9 mg/dL (ref 7.0–26.0)
CALCIUM: 11.2 mg/dL — AB (ref 8.4–10.4)
CHLORIDE: 105 meq/L (ref 98–109)
CO2: 30 mEq/L — ABNORMAL HIGH (ref 22–29)
CREATININE: 0.7 mg/dL (ref 0.6–1.1)
EGFR: 79 mL/min/{1.73_m2} — ABNORMAL LOW (ref >90)
Glucose: 126 mg/dl (ref 70–140)
POTASSIUM: 5.1 meq/L (ref 3.5–5.1)
Sodium: 144 mEq/L (ref 136–145)
Total Bilirubin: 0.45 mg/dL (ref 0.20–1.20)
Total Protein: 6.8 g/dL (ref 6.4–8.3)

## 2015-01-31 MED ORDER — DENOSUMAB 120 MG/1.7ML ~~LOC~~ SOLN
120.0000 mg | Freq: Once | SUBCUTANEOUS | Status: AC
  Administered 2015-01-31: 120 mg via SUBCUTANEOUS
  Filled 2015-01-31: qty 1.7

## 2015-01-31 NOTE — Patient Instructions (Signed)
Denosumab injection What is this medicine? DENOSUMAB (den oh sue mab) slows bone breakdown. Prolia is used to treat osteoporosis in women after menopause and in men. Xgeva is used to prevent bone fractures and other bone problems caused by cancer bone metastases. Xgeva is also used to treat giant cell tumor of the bone. This medicine may be used for other purposes; ask your health care provider or pharmacist if you have questions. COMMON BRAND NAME(S): Prolia, XGEVA What should I tell my health care provider before I take this medicine? They need to know if you have any of these conditions: -dental disease -eczema -infection or history of infections -kidney disease or on dialysis -low blood calcium or vitamin D -malabsorption syndrome -scheduled to have surgery or tooth extraction -taking medicine that contains denosumab -thyroid or parathyroid disease -an unusual reaction to denosumab, other medicines, foods, dyes, or preservatives -pregnant or trying to get pregnant -breast-feeding How should I use this medicine? This medicine is for injection under the skin. It is given by a health care professional in a hospital or clinic setting. If you are getting Prolia, a special MedGuide will be given to you by the pharmacist with each prescription and refill. Be sure to read this information carefully each time. For Prolia, talk to your pediatrician regarding the use of this medicine in children. Special care may be needed. For Xgeva, talk to your pediatrician regarding the use of this medicine in children. While this drug may be prescribed for children as young as 13 years for selected conditions, precautions do apply. Overdosage: If you think you've taken too much of this medicine contact a poison control center or emergency room at once. Overdosage: If you think you have taken too much of this medicine contact a poison control center or emergency room at once. NOTE: This medicine is only for  you. Do not share this medicine with others. What if I miss a dose? It is important not to miss your dose. Call your doctor or health care professional if you are unable to keep an appointment. What may interact with this medicine? Do not take this medicine with any of the following medications: -other medicines containing denosumab This medicine may also interact with the following medications: -medicines that suppress the immune system -medicines that treat cancer -steroid medicines like prednisone or cortisone This list may not describe all possible interactions. Give your health care provider a list of all the medicines, herbs, non-prescription drugs, or dietary supplements you use. Also tell them if you smoke, drink alcohol, or use illegal drugs. Some items may interact with your medicine. What should I watch for while using this medicine? Visit your doctor or health care professional for regular checks on your progress. Your doctor or health care professional may order blood tests and other tests to see how you are doing. Call your doctor or health care professional if you get a cold or other infection while receiving this medicine. Do not treat yourself. This medicine may decrease your body's ability to fight infection. You should make sure you get enough calcium and vitamin D while you are taking this medicine, unless your doctor tells you not to. Discuss the foods you eat and the vitamins you take with your health care professional. See your dentist regularly. Brush and floss your teeth as directed. Before you have any dental work done, tell your dentist you are receiving this medicine. Do not become pregnant while taking this medicine or for 5 months after stopping   it. Women should inform their doctor if they wish to become pregnant or think they might be pregnant. There is a potential for serious side effects to an unborn child. Talk to your health care professional or pharmacist for more  information. What side effects may I notice from receiving this medicine? Side effects that you should report to your doctor or health care professional as soon as possible: -allergic reactions like skin rash, itching or hives, swelling of the face, lips, or tongue -breathing problems -chest pain -fast, irregular heartbeat -feeling faint or lightheaded, falls -fever, chills, or any other sign of infection -muscle spasms, tightening, or twitches -numbness or tingling -skin blisters or bumps, or is dry, peels, or red -slow healing or unexplained pain in the mouth or jaw -unusual bleeding or bruising Side effects that usually do not require medical attention (Report these to your doctor or health care professional if they continue or are bothersome.): -muscle pain -stomach upset, gas This list may not describe all possible side effects. Call your doctor for medical advice about side effects. You may report side effects to FDA at 1-800-FDA-1088. Where should I keep my medicine? This medicine is only given in a clinic, doctor's office, or other health care setting and will not be stored at home. NOTE: This sheet is a summary. It may not cover all possible information. If you have questions about this medicine, talk to your doctor, pharmacist, or health care provider.  2015, Elsevier/Gold Standard. (2011-11-10 12:37:47)  

## 2015-02-01 ENCOUNTER — Other Ambulatory Visit: Payer: Self-pay | Admitting: Oncology

## 2015-02-02 ENCOUNTER — Ambulatory Visit
Admission: RE | Admit: 2015-02-02 | Discharge: 2015-02-02 | Disposition: A | Discharge status: Home / Self Care | Payer: Medicare Other | Source: Ambulatory Visit | Attending: Oncology | Admitting: Oncology

## 2015-02-02 DIAGNOSIS — N63 Unspecified lump in breast: Secondary | ICD-10-CM | POA: Diagnosis not present

## 2015-02-02 DIAGNOSIS — C50919 Malignant neoplasm of unspecified site of unspecified female breast: Secondary | ICD-10-CM

## 2015-02-02 DIAGNOSIS — C7951 Secondary malignant neoplasm of bone: Secondary | ICD-10-CM

## 2015-02-07 ENCOUNTER — Other Ambulatory Visit (HOSPITAL_BASED_OUTPATIENT_CLINIC_OR_DEPARTMENT_OTHER): Payer: Medicare Other

## 2015-02-07 DIAGNOSIS — C50912 Malignant neoplasm of unspecified site of left female breast: Secondary | ICD-10-CM | POA: Diagnosis present

## 2015-02-07 LAB — CBC WITH DIFFERENTIAL/PLATELET
BASO%: 1.2 % (ref 0.0–2.0)
BASOS ABS: 0.1 10*3/uL (ref 0.0–0.1)
EOS ABS: 0.4 10*3/uL (ref 0.0–0.5)
EOS%: 9.3 % — AB (ref 0.0–7.0)
HCT: 42.7 % (ref 34.8–46.6)
HGB: 14.3 g/dL (ref 11.6–15.9)
LYMPH%: 20.3 % (ref 14.0–49.7)
MCH: 30.2 pg (ref 25.1–34.0)
MCHC: 33.5 g/dL (ref 31.5–36.0)
MCV: 90.2 fL (ref 79.5–101.0)
MONO#: 0.3 10*3/uL (ref 0.1–0.9)
MONO%: 5.8 % (ref 0.0–14.0)
NEUT#: 2.9 10*3/uL (ref 1.5–6.5)
NEUT%: 63.4 % (ref 38.4–76.8)
Platelets: 239 10*3/uL (ref 145–400)
RBC: 4.73 10*6/uL (ref 3.70–5.45)
RDW: 14 % (ref 11.2–14.5)
WBC: 4.6 10*3/uL (ref 3.9–10.3)
lymph#: 0.9 10*3/uL (ref 0.9–3.3)

## 2015-02-14 ENCOUNTER — Other Ambulatory Visit (HOSPITAL_BASED_OUTPATIENT_CLINIC_OR_DEPARTMENT_OTHER): Payer: Medicare Other

## 2015-02-14 DIAGNOSIS — C50212 Malignant neoplasm of upper-inner quadrant of left female breast: Secondary | ICD-10-CM | POA: Diagnosis present

## 2015-02-14 DIAGNOSIS — C50912 Malignant neoplasm of unspecified site of left female breast: Secondary | ICD-10-CM

## 2015-02-14 DIAGNOSIS — C7951 Secondary malignant neoplasm of bone: Secondary | ICD-10-CM | POA: Diagnosis not present

## 2015-02-14 LAB — CBC WITH DIFFERENTIAL/PLATELET
BASO%: 1.1 % (ref 0.0–2.0)
BASOS ABS: 0 10*3/uL (ref 0.0–0.1)
EOS%: 5.4 % (ref 0.0–7.0)
Eosinophils Absolute: 0.2 10*3/uL (ref 0.0–0.5)
HEMATOCRIT: 40.4 % (ref 34.8–46.6)
HEMOGLOBIN: 13.5 g/dL (ref 11.6–15.9)
LYMPH#: 0.9 10*3/uL (ref 0.9–3.3)
LYMPH%: 27.7 % (ref 14.0–49.7)
MCH: 30.3 pg (ref 25.1–34.0)
MCHC: 33.4 g/dL (ref 31.5–36.0)
MCV: 90.6 fL (ref 79.5–101.0)
MONO#: 0.2 10*3/uL (ref 0.1–0.9)
MONO%: 5.5 % (ref 0.0–14.0)
NEUT#: 1.9 10*3/uL (ref 1.5–6.5)
NEUT%: 60.3 % (ref 38.4–76.8)
Platelets: 231 10*3/uL (ref 145–400)
RBC: 4.46 10*6/uL (ref 3.70–5.45)
RDW: 14.1 % (ref 11.2–14.5)
WBC: 3.1 10*3/uL — ABNORMAL LOW (ref 3.9–10.3)

## 2015-02-19 ENCOUNTER — Telehealth: Payer: Self-pay | Admitting: Oncology

## 2015-02-19 NOTE — Telephone Encounter (Signed)
Not able to reach patient. Moved appointment from 10/05 due to MD leaving early, moved to 10/14 per MD. Mailed calendar.

## 2015-02-21 ENCOUNTER — Other Ambulatory Visit (HOSPITAL_BASED_OUTPATIENT_CLINIC_OR_DEPARTMENT_OTHER): Payer: Medicare Other

## 2015-02-21 DIAGNOSIS — C50212 Malignant neoplasm of upper-inner quadrant of left female breast: Secondary | ICD-10-CM

## 2015-02-21 DIAGNOSIS — C7951 Secondary malignant neoplasm of bone: Secondary | ICD-10-CM

## 2015-02-21 DIAGNOSIS — C50912 Malignant neoplasm of unspecified site of left female breast: Secondary | ICD-10-CM

## 2015-02-21 LAB — CBC WITH DIFFERENTIAL/PLATELET
BASO%: 1.7 % (ref 0.0–2.0)
BASOS ABS: 0 10*3/uL (ref 0.0–0.1)
EOS%: 4.1 % (ref 0.0–7.0)
Eosinophils Absolute: 0.1 10*3/uL (ref 0.0–0.5)
HCT: 40.5 % (ref 34.8–46.6)
HEMOGLOBIN: 13.6 g/dL (ref 11.6–15.9)
LYMPH%: 37.5 % (ref 14.0–49.7)
MCH: 30.5 pg (ref 25.1–34.0)
MCHC: 33.6 g/dL (ref 31.5–36.0)
MCV: 90.6 fL (ref 79.5–101.0)
MONO#: 0.1 10*3/uL (ref 0.1–0.9)
MONO%: 6 % (ref 0.0–14.0)
NEUT#: 0.7 10*3/uL — ABNORMAL LOW (ref 1.5–6.5)
NEUT%: 50.7 % (ref 38.4–76.8)
Platelets: 113 10*3/uL — ABNORMAL LOW (ref 145–400)
RBC: 4.46 10*6/uL (ref 3.70–5.45)
RDW: 13.9 % (ref 11.2–14.5)
WBC: 1.3 10*3/uL — ABNORMAL LOW (ref 3.9–10.3)
lymph#: 0.5 10*3/uL — ABNORMAL LOW (ref 0.9–3.3)

## 2015-02-21 NOTE — Progress Notes (Signed)
Spoke with patient following her lab appt today.  She asked for clarification on her Leslee Home and how many days she would be off of the meds.  Writer explained the 21 day cycle to her.  The only problems she has experienced since taking the ibrance is diarrhea with some stomach cramping  2-3 times in the morning after taking the medication with her breakfast.  She has also noticed some fatigue and states that neither of those bother her.  Patient is to take her last of the 21 day cycle on Friday 02/23/15.  She will wait for her test results to start the new cycle.

## 2015-02-28 ENCOUNTER — Ambulatory Visit: Payer: Medicare Other | Admitting: Oncology

## 2015-02-28 ENCOUNTER — Ambulatory Visit (HOSPITAL_BASED_OUTPATIENT_CLINIC_OR_DEPARTMENT_OTHER): Payer: Medicare Other

## 2015-02-28 ENCOUNTER — Ambulatory Visit: Payer: Medicare Other

## 2015-02-28 ENCOUNTER — Telehealth: Payer: Self-pay

## 2015-02-28 ENCOUNTER — Other Ambulatory Visit (HOSPITAL_BASED_OUTPATIENT_CLINIC_OR_DEPARTMENT_OTHER): Payer: Medicare Other

## 2015-02-28 VITALS — BP 145/72 | HR 86 | Temp 98.2°F | Resp 20

## 2015-02-28 DIAGNOSIS — C7951 Secondary malignant neoplasm of bone: Secondary | ICD-10-CM

## 2015-02-28 DIAGNOSIS — C50212 Malignant neoplasm of upper-inner quadrant of left female breast: Secondary | ICD-10-CM

## 2015-02-28 DIAGNOSIS — C50919 Malignant neoplasm of unspecified site of unspecified female breast: Secondary | ICD-10-CM

## 2015-02-28 DIAGNOSIS — M810 Age-related osteoporosis without current pathological fracture: Secondary | ICD-10-CM

## 2015-02-28 DIAGNOSIS — C50912 Malignant neoplasm of unspecified site of left female breast: Secondary | ICD-10-CM

## 2015-02-28 LAB — COMPREHENSIVE METABOLIC PANEL (CC13)
ALT: 17 U/L (ref 0–55)
AST: 18 U/L (ref 5–34)
Albumin: 3.6 g/dL (ref 3.5–5.0)
Alkaline Phosphatase: 39 U/L — ABNORMAL LOW (ref 40–150)
Anion Gap: 5 mEq/L (ref 3–11)
BUN: 11.6 mg/dL (ref 7.0–26.0)
CALCIUM: 10.2 mg/dL (ref 8.4–10.4)
CHLORIDE: 107 meq/L (ref 98–109)
CO2: 29 mEq/L (ref 22–29)
Creatinine: 0.7 mg/dL (ref 0.6–1.1)
EGFR: 86 mL/min/{1.73_m2} — ABNORMAL LOW (ref >90)
Glucose: 92 mg/dl (ref 70–140)
POTASSIUM: 5 meq/L (ref 3.5–5.1)
SODIUM: 142 meq/L (ref 136–145)
Total Bilirubin: 0.36 mg/dL (ref 0.20–1.20)
Total Protein: 6.8 g/dL (ref 6.4–8.3)

## 2015-02-28 LAB — CBC WITH DIFFERENTIAL/PLATELET
BASO%: 1.9 % (ref 0.0–2.0)
BASOS ABS: 0 10*3/uL (ref 0.0–0.1)
EOS%: 2.6 % (ref 0.0–7.0)
Eosinophils Absolute: 0 10*3/uL (ref 0.0–0.5)
HEMATOCRIT: 38.7 % (ref 34.8–46.6)
HGB: 13.1 g/dL (ref 11.6–15.9)
LYMPH%: 38.6 % (ref 14.0–49.7)
MCH: 30.6 pg (ref 25.1–34.0)
MCHC: 33.9 g/dL (ref 31.5–36.0)
MCV: 90.2 fL (ref 79.5–101.0)
MONO#: 0.2 10*3/uL (ref 0.1–0.9)
MONO%: 10.4 % (ref 0.0–14.0)
NEUT#: 0.8 10*3/uL — ABNORMAL LOW (ref 1.5–6.5)
NEUT%: 46.5 % (ref 38.4–76.8)
Platelets: 209 10*3/uL (ref 145–400)
RBC: 4.29 10*6/uL (ref 3.70–5.45)
RDW: 14.6 % — ABNORMAL HIGH (ref 11.2–14.5)
WBC: 1.8 10*3/uL — ABNORMAL LOW (ref 3.9–10.3)
lymph#: 0.7 10*3/uL — ABNORMAL LOW (ref 0.9–3.3)

## 2015-02-28 MED ORDER — DENOSUMAB 120 MG/1.7ML ~~LOC~~ SOLN
120.0000 mg | Freq: Once | SUBCUTANEOUS | Status: AC
Start: 2015-02-28 — End: 2015-02-28
  Administered 2015-02-28: 120 mg via SUBCUTANEOUS
  Filled 2015-02-28: qty 1.7

## 2015-02-28 NOTE — Telephone Encounter (Signed)
Patient is unable to start back on her Ibrance, ANC is 0.8 today.. Patient is aware and is scheduled for a  her lab next Wednesday 03/07/15.

## 2015-03-01 ENCOUNTER — Telehealth: Payer: Self-pay

## 2015-03-01 NOTE — Telephone Encounter (Signed)
Patient called to discuss her blood counts and was concerned about going out in public with her WBC being low.  Patient due in next week for labs and doctor appt.

## 2015-03-07 ENCOUNTER — Encounter: Payer: Self-pay | Admitting: Oncology

## 2015-03-07 ENCOUNTER — Other Ambulatory Visit (HOSPITAL_BASED_OUTPATIENT_CLINIC_OR_DEPARTMENT_OTHER): Payer: Medicare Other

## 2015-03-07 DIAGNOSIS — C50912 Malignant neoplasm of unspecified site of left female breast: Secondary | ICD-10-CM

## 2015-03-07 DIAGNOSIS — C7951 Secondary malignant neoplasm of bone: Secondary | ICD-10-CM

## 2015-03-07 DIAGNOSIS — C50512 Malignant neoplasm of lower-outer quadrant of left female breast: Secondary | ICD-10-CM | POA: Diagnosis present

## 2015-03-07 LAB — CBC WITH DIFFERENTIAL/PLATELET
BASO%: 3.8 % — ABNORMAL HIGH (ref 0.0–2.0)
Basophils Absolute: 0.1 10*3/uL (ref 0.0–0.1)
EOS%: 3.4 % (ref 0.0–7.0)
Eosinophils Absolute: 0.1 10*3/uL (ref 0.0–0.5)
HCT: 40.1 % (ref 34.8–46.6)
HEMOGLOBIN: 13.8 g/dL (ref 11.6–15.9)
LYMPH%: 32.1 % (ref 14.0–49.7)
MCH: 31.2 pg (ref 25.1–34.0)
MCHC: 34.4 g/dL (ref 31.5–36.0)
MCV: 90.7 fL (ref 79.5–101.0)
MONO#: 0.4 10*3/uL (ref 0.1–0.9)
MONO%: 15.6 % — AB (ref 0.0–14.0)
NEUT%: 45.1 % (ref 38.4–76.8)
NEUTROS ABS: 1.2 10*3/uL — AB (ref 1.5–6.5)
Platelets: 265 10*3/uL (ref 145–400)
RBC: 4.42 10*6/uL (ref 3.70–5.45)
RDW: 16.2 % — ABNORMAL HIGH (ref 11.2–14.5)
WBC: 2.6 10*3/uL — AB (ref 3.9–10.3)
lymph#: 0.8 10*3/uL — ABNORMAL LOW (ref 0.9–3.3)

## 2015-03-07 NOTE — Progress Notes (Signed)
Applied for copay assistance and was approved w/ [employer]. She is approved for $5000 for her chemo drugs from 01/30/15 and will go back 6 months from date of letter. $0722 is a guaranteed award and the remaining award balance of $3350 is accessible on a [employer] basis. I forwarded copy of letter and proof of expenditure form to Arline Asp in the billing dept.

## 2015-03-09 ENCOUNTER — Telehealth: Payer: Self-pay | Admitting: Oncology

## 2015-03-09 ENCOUNTER — Ambulatory Visit (HOSPITAL_BASED_OUTPATIENT_CLINIC_OR_DEPARTMENT_OTHER): Payer: Medicare Other | Admitting: Oncology

## 2015-03-09 ENCOUNTER — Telehealth: Payer: Self-pay | Admitting: *Deleted

## 2015-03-09 VITALS — BP 132/75 | HR 106 | Temp 98.2°F | Resp 18 | Ht 62.0 in | Wt 119.5 lb

## 2015-03-09 DIAGNOSIS — C7951 Secondary malignant neoplasm of bone: Secondary | ICD-10-CM

## 2015-03-09 DIAGNOSIS — C50212 Malignant neoplasm of upper-inner quadrant of left female breast: Secondary | ICD-10-CM

## 2015-03-09 DIAGNOSIS — C50919 Malignant neoplasm of unspecified site of unspecified female breast: Secondary | ICD-10-CM

## 2015-03-09 DIAGNOSIS — M810 Age-related osteoporosis without current pathological fracture: Secondary | ICD-10-CM

## 2015-03-09 NOTE — Progress Notes (Signed)
[place]  Telephone:(336) [phone] Fax:(336) [phone]     ID: April Rosales DOB: [birth year]  MR#: [id number]  April Rosales#:[id number]  Patient Care Team: April Pinto, MD as PCP - General (Internal Medicine) April Cruel, MD as Consulting Physician (Oncology) PCP: April Richards, MD GYN: SU: April Rosales M.D. OTHER MD:  April Rosales M.D., April Chimera MD, April Rosales M.D.  CHIEF COMPLAINT: Stage IV breast cancer  CURRENT TREATMENT:  Letrozole; denosumab; palbociclib   BREAST CANCER HISTORY: From the original intake note:    April Rosales underwent right lumpectomy in 1985 at [facility] for what sounds like a stage I breast cancer. She tells me she had more than 30 lymph nodes removed from her right axilla and all of them were clear. She received  adjuvant radiation but no systemic treatment.  The patient had recently refused mammography with the last mammogram I can find dating back to August 2010.  More recently the patient presented with left scapular pain radiating down the left arm.  She was evaluated by Dr. [name], who obtained a chest x-ray showing a possible abnormality at T3. He then set up the patient for an MRI of the thoracic spine performed 08/16/2014.  This showed multiple compression fractures  (a similar picture had been noted on lumbar MRI 10/09/2011 ). However at T3 they noted tumor in the vertebral body extending into the left pedicle and into the lateral epidural space, displacing the cord to the right. There was no evidence of cord compression or cord signal abnormality. There were no other areas of tumor identified in the thoracic spine.         The patient was then referred to Dr. Hal Neer who on 08/24/2014 set April Rosales up for CT scans of the chest, abdomen and pelvis. There was a dense mass in the upper inner quadrant of the left breast measuring 1.6 cm. There was a 1.2 cm nodule in the minor fissure of the right lung and some  evidence of right lung fibrosis at the site of the prior radiation port.  There was also a thyroid mass measuring 2 cm. However there were no parenchymal lung or liver lesions. Incidental meningoceles were noted as well as sclerosis of the fifth and sixth ribs which were felt to be likely posttraumatic.   On 08/29/2014 the patient underwent bilateral diagnostic mammography with tomosynthesis and left breast ultrasonography.  There were postsurgical changes in the upper right breast. In the left breast there was an irregular mass measuring 2.3 cm in the upper inner quadrant. This was palpable. Ultrasound showed this to be hypoechoic and to measure 2.0 cm. There were adjacent areas of nodularity. There was no definite lymphadenopathy in the left axilla.  Biopsy of this breast mass 08/29/2014 showed an invasive adenocarcinoma with both ductal and lobular features (there was strong diffuse E-cadherin expression as well as areas with total absence of E-cadherin expression), with the preliminary prognostic profile showing strong estrogen positivity, very weak to near absent progesterone positivity, an MIB-1 of approximately 40%, and HER-2 equivocal  The patient's subsequent history is as detailed below.  INTERVAL HISTORY:  April Rosales returns today for follow-up of breast cancer. Her sister April Rosales is out of town so April Rosales came alone. She is tolerating the letrozole without significant complaints. However she did not "like" the [place]. It made her feel tired and she had some hair thinning. More importantly it dropped her white count and it is only now ( several weeks after stopping the  medication) that it is normalizing. That was at the 75 mg daily dose.  She is also on Denosumab monthly, which she tolerates well.  REVIEW OF SYSTEMS: She  Had an episode yesterday when she suddenly felt very tired. That has not recurred. She does have mild sinus symptoms which is likely related to the weather change recently. She  has some abdominal cramps at times. A detailed review of systems today was otherwise stable  PAST MEDICAL HISTORY: Past Medical History  Diagnosis Date  . Osteoporosis     takes Vit D  . Family history of ovarian cancer   . Breast cancer 1985/2016    takes [name] daily  . Arthritis   . Chronic back pain     stenosis/listhesis  . Radiation 11/23/14-12/11/14    30 Gy T1-T5     PAST SURGICAL HISTORY: Past Surgical History  Procedure Laterality Date  . Appendectomy  1977  . Abdominal hysterectomy      1980  . Thyroid cyst excision  1967  . Breast surgery Right 1985  . Application of intraoperative ct scan N/A 10/20/2014    Procedure: APPLICATION OF INTRAOPERATIVE CAT SCAN;  Surgeon: April Chimera, MD;  Location: [facility];  Service: Neurosurgery;  Laterality: N/A;    FAMILY HISTORY Family History  Problem Relation Age of Onset  . Heart disease Mother   . Hypertension Mother   . Heart disease Father   . Diabetes Father   . Breast cancer Paternal Aunt     4 paternal aunts with breast cancer over 44  . Prostate cancer Paternal Uncle   . Stroke Paternal Grandfather   . Ovarian cancer Paternal Aunt   . Huntington's disease Other     Nephew, inherited from his father   The patient's father died from a heart attack at the age of 39. He had 9 sisters. 3 of those sisters had breast cancer, all in a menopausal setting. Another sister had ovarian cancer. One of the paternal uncles had cancer of the colon "and back".  The patient's mother died at the age of 40. She was found to have breast cancer shortly before dying , during her final hospitalization.  GYNECOLOGIC HISTORY:  No LMP recorded. Patient has had a hysterectomy.  Menarche age 81, first live birth age 53, the patient is GX P1. She underwent hysterectomy in 1980. She thinks the ovaries were removed, but the CT scan obtained 08/24/2014 showed a definite right ovary. The left ovary may have been removed. She did not take  hormone replacement after the hysterectomy.  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 She is divorced, lives alone, with 2 cats. Her son Arnette Norris sharp lives in [place] where he works in Engineer, technical sales. He has 4 children of his own. The patient is a Psychologist, forensic.    ADVANCED DIRECTIVES:  In place. The patient has named her sister Pleas Koch [phone]) and her friend Janina Mayo [phone]) as joint healthcare powers of attorney   HEALTH MAINTENANCE: Social History  Substance Use Topics  . Smoking status: Former Smoker -- 0.50 packs/day for 10 years    Types: Cigarettes  . Smokeless tobacco: Former Systems developer     Comment: quit smoking 1970's  . Alcohol Use: 0.0 oz/week    0 Standard drinks or equivalent per week     Comment: Rare     Colonoscopy: never  PAP: status post hysterectomy  Bone density: 08/14/2014 at [facility], results pending  Lipid panel:  Allergies  Allergen Reactions  .  Ativan [Lorazepam]     Made her crazy  . Dilaudid [Hydromorphone Hcl]     Doesn't want  . Haldol [Haloperidol Lactate]     Made her crazy    Current Outpatient Prescriptions  Medication Sig Dispense Refill  . Ascorbic Acid (VITAMIN C PO) Take 1 tablet by mouth every other day. Takes qod    . calcium carbonate (TUMS) 500 MG chewable tablet Chew 2 tablets by mouth 2 (two) times daily.     . Cholecalciferol (VITAMIN D3 ADULT GUMMIES PO) Take 4,000 Units by mouth daily.    . Cyanocobalamin (VITAMIN B-12 PO) Take by mouth daily. Takes every 3 days    . Iron-Vitamins (GERITOL PO) Take by mouth daily.    Marland Kitchen letrozole (FEMARA) 2.5 MG tablet Take 1 tablet (2.5 mg total) by mouth daily. 90 tablet 12  . palbociclib (IBRANCE) 75 MG capsule Take 1 capsule (75 mg total) by mouth daily with breakfast. Take whole with food. 21 capsule 6   No current facility-administered medications for this visit.    OBJECTIVE:  Older white woman who appears stated age  79 Vitals:   03/09/15 1451  BP: 132/75    Pulse: 106  Temp: 98.2 F (36.8 C)  Resp: 18     Body mass index is 21.85 kg/(m^2).    ECOG FS:1 - Symptomatic but completely ambulatory  Sclerae unicteric, EOMs intact Oropharynx clear, dentition in good repair No cervical or supraclavicular adenopathy Lungs no rales or rhonchi Heart regular rate and rhythm Abd soft, nontender, positive bowel sounds MSK no focal spinal tenderness, no upper extremity lymphedema Neuro: nonfocal, well oriented, appropriate affect Breasts:  The right breast is status post remote lumpectomy with no evidence of local recurrence and a normal right axilla. In the left breast the previously palpable mass in the upper inner quadrant is now not easily palpable. It feels like a slight area of thickening. There are no skin or nipple changes of concern. The left axilla is benign.  LAB RESULTS:  CMP     Component Value Date/Time   NA 142 02/28/2015 1250   NA 138 10/20/2014 1317   K 5.0 02/28/2015 1250   K 4.2 10/20/2014 1317   CL 103 10/13/2014 1427   CO2 29 02/28/2015 1250   CO2 29 10/13/2014 1427   GLUCOSE 92 02/28/2015 1250   GLUCOSE 104* 10/13/2014 1427   BUN 11.6 02/28/2015 1250   BUN 10 10/13/2014 1427   CREATININE 0.7 02/28/2015 1250   CREATININE 0.56 10/13/2014 1427   CREATININE 0.59 08/21/2014 1713   CALCIUM 10.2 02/28/2015 1250   CALCIUM 10.5* 10/13/2014 1427   PROT 6.8 02/28/2015 1250   PROT 6.7 08/21/2014 1713   ALBUMIN 3.6 02/28/2015 1250   ALBUMIN 4.2 08/21/2014 1713   AST 18 02/28/2015 1250   AST 18 08/21/2014 1713   ALT 17 02/28/2015 1250   ALT 13 08/21/2014 1713   ALKPHOS 39* 02/28/2015 1250   ALKPHOS 53 08/21/2014 1713   BILITOT 0.36 02/28/2015 1250   BILITOT 0.4 08/21/2014 1713   GFRNONAA >60 10/13/2014 1427   GFRNONAA >89 08/21/2014 1713   GFRAA >60 10/13/2014 1427   GFRAA >89 08/21/2014 1713    INo results found for: SPEP, UPEP  Lab Results  Component Value Date   WBC 2.6* 03/07/2015   NEUTROABS 1.2* 03/07/2015    HGB 13.8 03/07/2015   HCT 40.1 03/07/2015   MCV 90.7 03/07/2015   PLT 265 03/07/2015      Chemistry  Component Value Date/Time   NA 142 02/28/2015 1250   NA 138 10/20/2014 1317   K 5.0 02/28/2015 1250   K 4.2 10/20/2014 1317   CL 103 10/13/2014 1427   CO2 29 02/28/2015 1250   CO2 29 10/13/2014 1427   BUN 11.6 02/28/2015 1250   BUN 10 10/13/2014 1427   CREATININE 0.7 02/28/2015 1250   CREATININE 0.56 10/13/2014 1427   CREATININE 0.59 08/21/2014 1713      Component Value Date/Time   CALCIUM 10.2 02/28/2015 1250   CALCIUM 10.5* 10/13/2014 1427   ALKPHOS 39* 02/28/2015 1250   ALKPHOS 53 08/21/2014 1713   AST 18 02/28/2015 1250   AST 18 08/21/2014 1713   ALT 17 02/28/2015 1250   ALT 13 08/21/2014 1713   BILITOT 0.36 02/28/2015 1250   BILITOT 0.4 08/21/2014 1713       Lab Results  Component Value Date   LABCA2 38 09/20/2014    No components found for: RKYHC623  No results for input(s): INR in the last 168 hours.  Urinalysis No results found for: COLORURINE, APPEARANCEUR, LABSPEC, PHURINE, GLUCOSEU, HGBUR, BILIRUBINUR, KETONESUR, PROTEINUR, UROBILINOGEN, NITRITE, LEUKOCYTESUR  STUDIES: CLINICAL DATA: 74 year old female presenting for tumor check of the left breast to evaluate for treatment response.  EXAM: ULTRASOUND OF THE LEFT BREAST  COMPARISON: Previous exam(s).  FINDINGS: On physical exam, an ill-defined firm mass is palpated at the 10 o'clock location in the left breast approximately 5 cm from the nipple, corresponding with the known left breast malignancy.  Targeted ultrasound is performed, showing an irregular hypoechoic mass measuring 1.4 x 1.1 x 0.7 cm, previously measuring 1.6 x 1.3 x 1.2 cm.  IMPRESSION: Interval decrease in size of the known malignancy in the left breast at 10 o'clock.  RECOMMENDATION: Recommend continued treatment plan.  I have discussed the findings and recommendations with the patient. Results were also  provided in writing at the conclusion of the visit. If applicable, a reminder letter will be sent to the patient regarding the next appointment.  BI-RADS CATEGORY 6: Known biopsy-proven malignancy - appropriate action should be taken.   Electronically Signed  By: Ammie Ferrier M.D.  On: 02/02/2015 13:27   ASSESSMENT: 73 y.o. [place] woman with stage IV left breast cancer involving bone  (1) status post right lumpectomy and axillary node dissection in 1985 followed by radiation at [facility] 08/16/2014: measurable disease in spine, lung and left breast   (2) evaluation for left shoulder pain led to thoracic spine MRI 08/16/2014 showing a pathologic fracture at T3 with epidural tumor displacing the cord to the right, but no cord compression. CT scans of the chest, abdomen and pelvis 08/24/2014 showed in addition a mass in the upper left breast measuring 1.6 cm and a nodule in the minor fissure of the right lung measuring 1.2 cm, but no parenchymal lung or liver lesions. CA 27-29 was noninformative at 38 (09/20/2014)    (3) mammography and ultrasonography 08/29/2014 show a mass in the upper inner left breast which was palpable,  measuring 2.0 cm by ultrasound. Biopsy of this mass 08/29/2014 showed an invasive breast cancer with both lobular and ductal features, estrogen receptor positive, progesterone receptor weakly positive, with an MIB-1 in the 40% range, HER-2 equivocal (6 else ratio 1.5, but average number her nucleus 5.8)   (4) letrozole started 08/31/2014;   (a) palbociclib added Sept 2016 at 75 mg 21/7, with significant neutropenia; not repeated after first cycle   (5)  zolendronate started 09/20/2014,  rstopped after initial dose due to poor tolerance  (a) denosumab/ Xgeva started 01/31/2015, repeated every 4 weeks   (6) on 10/20/2014 the patient underwent T2-T3 and T4 decompressive laminectomy with removal of epidural tumor, C7-T4 segmental pedicle screw  instrumentation with virage screw system with arrow guidance protocol and C7-T4 posterolateral fusion. The cells were positive for the estrogen receptor. HER-2/neu testing by [facility] showed again equivocal results,  (7) radiation 11/23/2014-12/11/2014.  (a) T1-T5 was treated to 30 Gy in 12 fractions at 2.5 Gy per fraction   (8)  consider eventual left lumpectomy or mastectomy depending on  longer-term results of systemic therapy  (9) genetics testing using the Breast/Ovarian Cancer Panel through [name], MD) found no mutations in ATM, BARD1, BRCA1, BRCA2, BRIP1, CDH1, CHEK2, EPCAM, FANCC, MLH1, MSH2, MSH6, NBN, PALB2, PMS2, PTEN, RAD51C, RAD51D, STK11, TP53, or XRCC2    PLAN: [place]  Had multiple even if someone taken in consistent symptoms on the Palbociclib. More objectively, her neutrophils dropped and took a long time to recover. I don't think this is going to be her medication.  We are continuing on the letrozole, which is clearly working as evidenced by the shrinkage of the measurable disease in the left breast area we are also continuing on the Denosumab every month which she tolerates well.   She is going to see me again at the end of January 2017 and before that visit we will obtain a bone scan and another ultrasound of the left breast. Even that we started zolendronate mid April 2016, around April or May 2017 we will probably change the Xgeva to every 2 months and do that for a year after which we will go to every 3 months. In the meantime she is making sure  To see the dentist at least twice a year.   at this point [place] is very encouraged and motivated. She knows to call for any problems that may develop before her next visit here.  April Cruel, MD   03/09/2015 2:53 PM Medical Oncology and Hematology [facility] [address] [place], [place] [address] Tel. [phone]    Fax. [phone]

## 2015-03-09 NOTE — Telephone Encounter (Signed)
Voicemail: "[facility] with the [facility].  Calling because we need clarification of EPIC order received for this patient.  Call [phone]."  Call forwarded to [facility].  Patient was seen today.  Per P.O.F., needs [place] of left breast one week prior to next F/U visit at [facility] to follow measurable diseae in breast.  No mammogram needed.

## 2015-03-09 NOTE — Telephone Encounter (Signed)
appointments made and avs printed for patient,the breast center was called but they were having to look at the patients treatment plan/orders and speak with the radiologist.  They will call me with an appointment and patient is aware  April Rosales

## 2015-03-14 ENCOUNTER — Encounter: Payer: Self-pay | Admitting: Internal Medicine

## 2015-03-14 ENCOUNTER — Ambulatory Visit (INDEPENDENT_AMBULATORY_CARE_PROVIDER_SITE_OTHER): Payer: Medicare Other | Admitting: Internal Medicine

## 2015-03-14 VITALS — BP 116/76 | HR 92 | Temp 97.2°F | Resp 16 | Ht 62.0 in | Wt 120.0 lb

## 2015-03-14 DIAGNOSIS — Z6821 Body mass index (BMI) 21.0-21.9, adult: Secondary | ICD-10-CM | POA: Diagnosis not present

## 2015-03-14 DIAGNOSIS — C50919 Malignant neoplasm of unspecified site of unspecified female breast: Secondary | ICD-10-CM | POA: Diagnosis not present

## 2015-03-14 DIAGNOSIS — Z23 Encounter for immunization: Secondary | ICD-10-CM | POA: Diagnosis not present

## 2015-03-14 DIAGNOSIS — C799 Secondary malignant neoplasm of unspecified site: Secondary | ICD-10-CM | POA: Diagnosis not present

## 2015-03-14 DIAGNOSIS — I10 Essential (primary) hypertension: Secondary | ICD-10-CM | POA: Diagnosis not present

## 2015-03-14 DIAGNOSIS — R7303 Prediabetes: Secondary | ICD-10-CM

## 2015-03-14 DIAGNOSIS — R7309 Other abnormal glucose: Secondary | ICD-10-CM

## 2015-03-14 DIAGNOSIS — Z79899 Other long term (current) drug therapy: Secondary | ICD-10-CM

## 2015-03-14 DIAGNOSIS — E785 Hyperlipidemia, unspecified: Secondary | ICD-10-CM | POA: Diagnosis not present

## 2015-03-14 DIAGNOSIS — Z6822 Body mass index (BMI) 22.0-22.9, adult: Secondary | ICD-10-CM | POA: Insufficient documentation

## 2015-03-14 DIAGNOSIS — E559 Vitamin D deficiency, unspecified: Secondary | ICD-10-CM

## 2015-03-14 DIAGNOSIS — R0989 Other specified symptoms and signs involving the circulatory and respiratory systems: Secondary | ICD-10-CM

## 2015-03-14 LAB — MAGNESIUM: MAGNESIUM: 1.9 mg/dL (ref 1.5–2.5)

## 2015-03-14 LAB — TSH: TSH: 1.325 u[IU]/mL (ref 0.350–4.500)

## 2015-03-14 LAB — LIPID PANEL
CHOLESTEROL: 175 mg/dL (ref 125–200)
HDL: 49 mg/dL (ref >=46)
LDL Cholesterol: 101 mg/dL (ref <130)
TRIGLYCERIDES: 125 mg/dL (ref <150)
Total CHOL/HDL Ratio: 3.6 Ratio (ref <=5.0)
VLDL: 25 mg/dL (ref <30)

## 2015-03-14 LAB — HEMOGLOBIN A1C
Hgb A1c MFr Bld: 5.7 % — ABNORMAL HIGH (ref <5.7)
MEAN PLASMA GLUCOSE: 117 mg/dL — AB (ref <117)

## 2015-03-14 NOTE — Patient Instructions (Signed)

## 2015-03-14 NOTE — Progress Notes (Signed)
Patient ID: April Rosales, female   DOB: 1940-07-16, 74 y.o.   MRN: [id number]     This very nice 74 y.o. DWF presents for 3 month follow up with Hypertension, Hyperlipidemia, Pre-Diabetes and Vitamin D Deficiency.      Patient had a Lt Breast Cancer in 1984 and since discovering widely metastatic boney disease in April this year, she has been undergoing Hormonal and chemotherapy by Dr Jana Hakim and has completed Radiotherapy by Dr Pablo Ledger.      Patient is treated for HTN & BP has been controlled at home. Today's BP: 116/76 mmHg. Patient has had no complaints of any cardiac type chest pain, palpitations, dyspnea/orthopnea/PND, dizziness, claudication, or dependent edema.     Hyperlipidemia is controlled with diet & meds. Patient denies myalgias or other med SE's. Last Lipids were 08/28/2014: Cholesterol 175; HDL 49; LDL 100*; Triglycerides 128 on      Also, the patient has history of PreDiabetes/Insulin Resistance  and has had no symptoms of reactive hypoglycemia, diabetic polys, paresthesias or visual blurring.  Last A1c was  5.4% on 08/28/2014.      Further, the patient also has history of Vitamin D Deficiency of 28 in 2008 and supplements vitamin D without any suspected side-effects. Last vitamin D was still low 39 on 08/28/2014.  Medication Sig  . VITAMIN C  Take 1 tablet by mouth every other day. Takes qod  . aspirin EC 325 MG tablet Take 1 tablet (325 mg total) by mouth daily.  . calcium carbonate (TUMS) 500 MG Chew 2 tablets by mouth 2 (two) times daily.   Marland Kitchen VITAMIN D GUMMIES  Take 4,000 Units by mouth daily.  Marland Kitchen VITAMIN B-12 PO Take by mouth daily. Takes every 3 days  . Iron-Vitamins (GERITOL) Take by mouth daily.  Marland Kitchen letrozole (FEMARA) 2.5 MG tablet Take 1 tablet (2.5 mg total) by mouth daily.   Allergies  Allergen Reactions  . Ativan [Lorazepam]     Made her crazy  . Dilaudid [Hydromorphone Hcl]     Doesn't want  . Haldol [Haloperidol Lactate]     Made her crazy   PMHx:   Past  Medical History  Diagnosis Date  . Osteoporosis     takes Vit D  . Family history of ovarian cancer   . Breast cancer [facility]) 1985/2016    takes [name] daily  . Arthritis   . Chronic back pain     stenosis/listhesis  . Radiation 11/23/14-12/11/14    30 Gy T1-T5    Immunization History  Administered Date(s) Administered  . Influenza Split 02/07/2013  . Td 01/05/2008   Past Surgical History  Procedure Laterality Date  . Appendectomy  1977  . Abdominal hysterectomy      1980  . Thyroid cyst excision  1967  . Breast surgery Right 1985  . Application of intraoperative ct scan N/A 10/20/2014    Procedure: APPLICATION OF INTRAOPERATIVE CAT SCAN;  Surgeon: [name], MD;  Location: [facility];  Service: Neurosurgery;  Laterality: N/A;   FHx:    Reviewed / unchanged  SHx:    Reviewed / unchanged  Systems Review:  Constitutional: Denies fever, chills, wt changes, headaches, insomnia, fatigue, night sweats, change in appetite. Eyes: Denies redness, blurred vision, diplopia, discharge, itchy, watery eyes.  ENT: Denies discharge, congestion, post nasal drip, epistaxis, sore throat, earache, hearing loss, dental pain, tinnitus, vertigo, sinus pain, snoring.  CV: Denies chest pain, palpitations, irregular heartbeat, syncope, dyspnea, diaphoresis, orthopnea, PND, claudication or edema.  Respiratory: denies cough, dyspnea, DOE, pleurisy, hoarseness, laryngitis, wheezing.  Gastrointestinal: Denies dysphagia, odynophagia, heartburn, reflux, water brash, abdominal pain or cramps, nausea, vomiting, bloating, diarrhea, constipation, hematemesis, melena, hematochezia  or hemorrhoids. Genitourinary: Denies dysuria, frequency, urgency, nocturia, hesitancy, discharge, hematuria or flank pain. Musculoskeletal: Denies arthralgias, myalgias, stiffness, jt. swelling, pain, limping or strain/sprain.  Skin: Denies pruritus, rash, hives, warts, acne, eczema or change in skin lesion(s). Neuro: No weakness,  tremor, incoordination, spasms, paresthesia or pain. Psychiatric: Denies confusion, memory loss or sensory loss. Endo: Denies change in weight, skin or hair change.  Heme/Lymph: No excessive bleeding, bruising or enlarged lymph nodes.  Physical Exam  BP 116/76 mmHg  Pulse 92  Temp(Src) 97.2 F (36.2 C)  Resp 16  Ht 5\' 2"  (1.575 m)  Wt 120 lb (54.432 kg)  BMI 21.94 kg/m2  Appears well nourished and in no distress. Eyes: PERRLA, EOMs, conjunctiva no swelling or erythema. Sinuses: No frontal/maxillary tenderness ENT/Mouth: EAC's clear, TM's nl w/o erythema, bulging. Nares clear w/o erythema, swelling, exudates. Oropharynx clear without erythema or exudates. Oral hygiene is good. Tongue normal, non obstructing. Hearing intact.  Neck: Supple. Thyroid nl. Car 2+/2+ without bruits, nodes or JVD. Chest: Respirations nl with BS clear & equal w/o rales, rhonchi, wheezing or stridor.  Cor: Heart sounds normal w/ regular rate and rhythm without sig. murmurs, gallops, clicks, or rubs. Peripheral pulses normal and equal  without edema.  Abdomen: Soft & bowel sounds normal. Non-tender w/o guarding, rebound, hernias, masses, or organomegaly.  Lymphatics: Unremarkable.  Musculoskeletal: Full ROM all peripheral extremities, joint stability, 5/5 strength, and normal gait.  Skin: Warm, dry without exposed rashes, lesions or ecchymosis apparent.  Neuro: Cranial nerves intact, reflexes equal bilaterally. Sensory-motor testing grossly intact. Tendon reflexes grossly intact.  Pysch: Alert & oriented x 3.  Insight and judgement nl & appropriate. No ideations.  Assessment and Plan:  1. Labile hypertension  - TSH  2. Hyperlipidemia  - Lipid panel - TSH  3. Prediabetes  - Hemoglobin A1c - Insulin, random  4. Vitamin D deficiency  - Vit D  25 hydroxy   5. BMI  21.95   6. Breast cancer metastasized to multiple sites, unspecified laterality ([place])   7. Medication management  -  Magnesium  8. Other abnormal glucose  - Hemoglobin A1c - Insulin, random  9. Need for prophylactic vaccination and inoculation against influenza  - Flu vaccine HIGH DOSE PF (Fluzone High dose)   Recommended regular exercise, BP monitoring, weight control, and discussed med and SE's. Recommended labs to assess and monitor clinical status. Further disposition pending results of labs. Over 30 minutes of exam, counseling, chart review was performed

## 2015-03-15 ENCOUNTER — Other Ambulatory Visit: Payer: Self-pay

## 2015-03-15 DIAGNOSIS — C7951 Secondary malignant neoplasm of bone: Secondary | ICD-10-CM

## 2015-03-15 DIAGNOSIS — C50919 Malignant neoplasm of unspecified site of unspecified female breast: Secondary | ICD-10-CM

## 2015-03-15 LAB — INSULIN, RANDOM: INSULIN: 7.9 u[IU]/mL (ref 2.0–19.6)

## 2015-03-15 LAB — VITAMIN D 25 HYDROXY (VIT D DEFICIENCY, FRACTURES): Vit D, 25-Hydroxy: 52 ng/mL (ref 30–100)

## 2015-03-28 ENCOUNTER — Other Ambulatory Visit (HOSPITAL_BASED_OUTPATIENT_CLINIC_OR_DEPARTMENT_OTHER): Payer: Medicare Other

## 2015-03-28 ENCOUNTER — Ambulatory Visit (HOSPITAL_BASED_OUTPATIENT_CLINIC_OR_DEPARTMENT_OTHER): Payer: Medicare Other

## 2015-03-28 VITALS — BP 133/69 | HR 99 | Temp 98.6°F | Resp 20

## 2015-03-28 DIAGNOSIS — C50512 Malignant neoplasm of lower-outer quadrant of left female breast: Secondary | ICD-10-CM

## 2015-03-28 DIAGNOSIS — C7951 Secondary malignant neoplasm of bone: Secondary | ICD-10-CM

## 2015-03-28 DIAGNOSIS — C50919 Malignant neoplasm of unspecified site of unspecified female breast: Secondary | ICD-10-CM

## 2015-03-28 DIAGNOSIS — C50912 Malignant neoplasm of unspecified site of left female breast: Secondary | ICD-10-CM

## 2015-03-28 DIAGNOSIS — M810 Age-related osteoporosis without current pathological fracture: Secondary | ICD-10-CM

## 2015-03-28 LAB — CBC WITH DIFFERENTIAL/PLATELET
BASO%: 1.7 % (ref 0.0–2.0)
BASOS ABS: 0.1 10*3/uL (ref 0.0–0.1)
EOS ABS: 0.9 10*3/uL — AB (ref 0.0–0.5)
EOS%: 14.8 % — ABNORMAL HIGH (ref 0.0–7.0)
HCT: 42.1 % (ref 34.8–46.6)
HEMOGLOBIN: 14.2 g/dL (ref 11.6–15.9)
LYMPH%: 17.8 % (ref 14.0–49.7)
MCH: 31.3 pg (ref 25.1–34.0)
MCHC: 33.8 g/dL (ref 31.5–36.0)
MCV: 92.7 fL (ref 79.5–101.0)
MONO#: 0.5 10*3/uL (ref 0.1–0.9)
MONO%: 8.1 % (ref 0.0–14.0)
NEUT#: 3.6 10*3/uL (ref 1.5–6.5)
NEUT%: 57.6 % (ref 38.4–76.8)
Platelets: 281 10*3/uL (ref 145–400)
RBC: 4.54 10*6/uL (ref 3.70–5.45)
RDW: 16.9 % — AB (ref 11.2–14.5)
WBC: 6.2 10*3/uL (ref 3.9–10.3)
lymph#: 1.1 10*3/uL (ref 0.9–3.3)

## 2015-03-28 LAB — COMPREHENSIVE METABOLIC PANEL (CC13)
ALBUMIN: 3.6 g/dL (ref 3.5–5.0)
ALK PHOS: 43 U/L (ref 40–150)
ALT: 16 U/L (ref 0–55)
AST: 21 U/L (ref 5–34)
Anion Gap: 6 mEq/L (ref 3–11)
BUN: 16.2 mg/dL (ref 7.0–26.0)
CHLORIDE: 105 meq/L (ref 98–109)
CO2: 29 mEq/L (ref 22–29)
Calcium: 10.3 mg/dL (ref 8.4–10.4)
Creatinine: 0.7 mg/dL (ref 0.6–1.1)
EGFR: 84 mL/min/{1.73_m2} — AB (ref >90)
GLUCOSE: 98 mg/dL (ref 70–140)
POTASSIUM: 4.1 meq/L (ref 3.5–5.1)
SODIUM: 140 meq/L (ref 136–145)
Total Bilirubin: 0.33 mg/dL (ref 0.20–1.20)
Total Protein: 6.7 g/dL (ref 6.4–8.3)

## 2015-03-28 MED ORDER — DENOSUMAB 120 MG/1.7ML ~~LOC~~ SOLN
120.0000 mg | Freq: Once | SUBCUTANEOUS | Status: AC
Start: 2015-03-28 — End: 2015-03-28
  Administered 2015-03-28: 120 mg via SUBCUTANEOUS
  Filled 2015-03-28: qty 1.7

## 2015-03-28 NOTE — Patient Instructions (Signed)
Denosumab injection What is this medicine? DENOSUMAB (den oh sue mab) slows bone breakdown. Prolia is used to treat osteoporosis in women after menopause and in men. Xgeva is used to prevent bone fractures and other bone problems caused by cancer bone metastases. Xgeva is also used to treat giant cell tumor of the bone. This medicine may be used for other purposes; ask your health care provider or pharmacist if you have questions. COMMON BRAND NAME(S): Prolia, XGEVA What should I tell my health care provider before I take this medicine? They need to know if you have any of these conditions: -dental disease -eczema -infection or history of infections -kidney disease or on dialysis -low blood calcium or vitamin D -malabsorption syndrome -scheduled to have surgery or tooth extraction -taking medicine that contains denosumab -thyroid or parathyroid disease -an unusual reaction to denosumab, other medicines, foods, dyes, or preservatives -pregnant or trying to get pregnant -breast-feeding How should I use this medicine? This medicine is for injection under the skin. It is given by a health care professional in a hospital or clinic setting. If you are getting Prolia, a special MedGuide will be given to you by the pharmacist with each prescription and refill. Be sure to read this information carefully each time. For Prolia, talk to your pediatrician regarding the use of this medicine in children. Special care may be needed. For Xgeva, talk to your pediatrician regarding the use of this medicine in children. While this drug may be prescribed for children as young as 13 years for selected conditions, precautions do apply. Overdosage: If you think you've taken too much of this medicine contact a poison control center or emergency room at once. Overdosage: If you think you have taken too much of this medicine contact a poison control center or emergency room at once. NOTE: This medicine is only for  you. Do not share this medicine with others. What if I miss a dose? It is important not to miss your dose. Call your doctor or health care professional if you are unable to keep an appointment. What may interact with this medicine? Do not take this medicine with any of the following medications: -other medicines containing denosumab This medicine may also interact with the following medications: -medicines that suppress the immune system -medicines that treat cancer -steroid medicines like prednisone or cortisone This list may not describe all possible interactions. Give your health care provider a list of all the medicines, herbs, non-prescription drugs, or dietary supplements you use. Also tell them if you smoke, drink alcohol, or use illegal drugs. Some items may interact with your medicine. What should I watch for while using this medicine? Visit your doctor or health care professional for regular checks on your progress. Your doctor or health care professional may order blood tests and other tests to see how you are doing. Call your doctor or health care professional if you get a cold or other infection while receiving this medicine. Do not treat yourself. This medicine may decrease your body's ability to fight infection. You should make sure you get enough calcium and vitamin D while you are taking this medicine, unless your doctor tells you not to. Discuss the foods you eat and the vitamins you take with your health care professional. See your dentist regularly. Brush and floss your teeth as directed. Before you have any dental work done, tell your dentist you are receiving this medicine. Do not become pregnant while taking this medicine or for 5 months after stopping   it. Women should inform their doctor if they wish to become pregnant or think they might be pregnant. There is a potential for serious side effects to an unborn child. Talk to your health care professional or pharmacist for more  information. What side effects may I notice from receiving this medicine? Side effects that you should report to your doctor or health care professional as soon as possible: -allergic reactions like skin rash, itching or hives, swelling of the face, lips, or tongue -breathing problems -chest pain -fast, irregular heartbeat -feeling faint or lightheaded, falls -fever, chills, or any other sign of infection -muscle spasms, tightening, or twitches -numbness or tingling -skin blisters or bumps, or is dry, peels, or red -slow healing or unexplained pain in the mouth or jaw -unusual bleeding or bruising Side effects that usually do not require medical attention (Report these to your doctor or health care professional if they continue or are bothersome.): -muscle pain -stomach upset, gas This list may not describe all possible side effects. Call your doctor for medical advice about side effects. You may report side effects to FDA at 1-800-FDA-1088. Where should I keep my medicine? This medicine is only given in a clinic, doctor's office, or other health care setting and will not be stored at home. NOTE: This sheet is a summary. It may not cover all possible information. If you have questions about this medicine, talk to your doctor, pharmacist, or health care provider.  2015, Elsevier/Gold Standard. (2011-11-10 12:37:47)  

## 2015-04-04 ENCOUNTER — Other Ambulatory Visit: Payer: Medicare Other

## 2015-04-11 ENCOUNTER — Other Ambulatory Visit: Payer: Medicare Other

## 2015-04-18 ENCOUNTER — Other Ambulatory Visit: Payer: Medicare Other

## 2015-04-25 ENCOUNTER — Other Ambulatory Visit: Payer: Self-pay | Admitting: Oncology

## 2015-04-25 ENCOUNTER — Ambulatory Visit (HOSPITAL_BASED_OUTPATIENT_CLINIC_OR_DEPARTMENT_OTHER): Payer: Medicare Other

## 2015-04-25 ENCOUNTER — Other Ambulatory Visit (HOSPITAL_BASED_OUTPATIENT_CLINIC_OR_DEPARTMENT_OTHER): Payer: Medicare Other

## 2015-04-25 VITALS — BP 139/75 | HR 93 | Temp 97.9°F | Resp 14

## 2015-04-25 DIAGNOSIS — C50212 Malignant neoplasm of upper-inner quadrant of left female breast: Secondary | ICD-10-CM | POA: Diagnosis present

## 2015-04-25 DIAGNOSIS — C7951 Secondary malignant neoplasm of bone: Secondary | ICD-10-CM

## 2015-04-25 DIAGNOSIS — C50912 Malignant neoplasm of unspecified site of left female breast: Secondary | ICD-10-CM

## 2015-04-25 DIAGNOSIS — C50919 Malignant neoplasm of unspecified site of unspecified female breast: Secondary | ICD-10-CM

## 2015-04-25 DIAGNOSIS — M810 Age-related osteoporosis without current pathological fracture: Secondary | ICD-10-CM

## 2015-04-25 LAB — CBC WITH DIFFERENTIAL/PLATELET
BASO%: 1.4 % (ref 0.0–2.0)
Basophils Absolute: 0.1 10*3/uL (ref 0.0–0.1)
EOS ABS: 0.5 10*3/uL (ref 0.0–0.5)
EOS%: 10.9 % — ABNORMAL HIGH (ref 0.0–7.0)
HCT: 42.3 % (ref 34.8–46.6)
HEMOGLOBIN: 14.2 g/dL (ref 11.6–15.9)
LYMPH%: 24.2 % (ref 14.0–49.7)
MCH: 31.6 pg (ref 25.1–34.0)
MCHC: 33.6 g/dL (ref 31.5–36.0)
MCV: 94 fL (ref 79.5–101.0)
MONO#: 0.4 10*3/uL (ref 0.1–0.9)
MONO%: 8.9 % (ref 0.0–14.0)
NEUT%: 54.6 % (ref 38.4–76.8)
NEUTROS ABS: 2.4 10*3/uL (ref 1.5–6.5)
PLATELETS: 255 10*3/uL (ref 145–400)
RBC: 4.5 10*6/uL (ref 3.70–5.45)
RDW: 14.8 % — AB (ref 11.2–14.5)
WBC: 4.4 10*3/uL (ref 3.9–10.3)
lymph#: 1.1 10*3/uL (ref 0.9–3.3)

## 2015-04-25 LAB — COMPREHENSIVE METABOLIC PANEL (CC13)
ALT: 12 U/L (ref 0–55)
ANION GAP: 7 meq/L (ref 3–11)
AST: 20 U/L (ref 5–34)
Albumin: 3.6 g/dL (ref 3.5–5.0)
Alkaline Phosphatase: 39 U/L — ABNORMAL LOW (ref 40–150)
BILIRUBIN TOTAL: 0.31 mg/dL (ref 0.20–1.20)
BUN: 15.4 mg/dL (ref 7.0–26.0)
CALCIUM: 10.6 mg/dL — AB (ref 8.4–10.4)
CO2: 30 meq/L — AB (ref 22–29)
CREATININE: 0.7 mg/dL (ref 0.6–1.1)
Chloride: 105 mEq/L (ref 98–109)
EGFR: 80 mL/min/{1.73_m2} — ABNORMAL LOW (ref >90)
Glucose: 91 mg/dl (ref 70–140)
Potassium: 4.3 mEq/L (ref 3.5–5.1)
Sodium: 143 mEq/L (ref 136–145)
TOTAL PROTEIN: 6.9 g/dL (ref 6.4–8.3)

## 2015-04-25 MED ORDER — DENOSUMAB 120 MG/1.7ML ~~LOC~~ SOLN
120.0000 mg | Freq: Once | SUBCUTANEOUS | Status: AC
  Administered 2015-04-25: 120 mg via SUBCUTANEOUS
  Filled 2015-04-25: qty 1.7

## 2015-05-17 ENCOUNTER — Other Ambulatory Visit: Payer: Self-pay | Admitting: Internal Medicine

## 2015-05-17 DIAGNOSIS — J0141 Acute recurrent pansinusitis: Secondary | ICD-10-CM

## 2015-05-17 DIAGNOSIS — J029 Acute pharyngitis, unspecified: Secondary | ICD-10-CM

## 2015-05-17 MED ORDER — PREDNISONE 20 MG PO TABS: ORAL_TABLET | ORAL | Status: DC

## 2015-05-17 MED ORDER — AZITHROMYCIN 250 MG PO TABS: ORAL_TABLET | ORAL | Status: DC

## 2015-05-17 MED ORDER — PROMETHAZINE-DM 6.25-15 MG/5ML PO SYRP: ORAL_SOLUTION | ORAL | Status: DC

## 2015-05-23 ENCOUNTER — Other Ambulatory Visit (HOSPITAL_BASED_OUTPATIENT_CLINIC_OR_DEPARTMENT_OTHER): Payer: Medicare Other

## 2015-05-23 ENCOUNTER — Ambulatory Visit (HOSPITAL_BASED_OUTPATIENT_CLINIC_OR_DEPARTMENT_OTHER): Payer: Medicare Other

## 2015-05-23 VITALS — BP 141/78 | HR 108 | Temp 98.1°F

## 2015-05-23 DIAGNOSIS — C50919 Malignant neoplasm of unspecified site of unspecified female breast: Secondary | ICD-10-CM

## 2015-05-23 DIAGNOSIS — C50912 Malignant neoplasm of unspecified site of left female breast: Secondary | ICD-10-CM

## 2015-05-23 DIAGNOSIS — C7951 Secondary malignant neoplasm of bone: Secondary | ICD-10-CM

## 2015-05-23 DIAGNOSIS — M810 Age-related osteoporosis without current pathological fracture: Secondary | ICD-10-CM

## 2015-05-23 LAB — CBC WITH DIFFERENTIAL/PLATELET
BASO%: 0.4 % (ref 0.0–2.0)
BASOS ABS: 0 10*3/uL (ref 0.0–0.1)
EOS%: 1.9 % (ref 0.0–7.0)
Eosinophils Absolute: 0.1 10*3/uL (ref 0.0–0.5)
HEMATOCRIT: 48.3 % — AB (ref 34.8–46.6)
HEMOGLOBIN: 16.1 g/dL — AB (ref 11.6–15.9)
LYMPH#: 1.8 10*3/uL (ref 0.9–3.3)
LYMPH%: 22.6 % (ref 14.0–49.7)
MCH: 31.2 pg (ref 25.1–34.0)
MCHC: 33.3 g/dL (ref 31.5–36.0)
MCV: 93.7 fL (ref 79.5–101.0)
MONO#: 1 10*3/uL — ABNORMAL HIGH (ref 0.1–0.9)
MONO%: 12 % (ref 0.0–14.0)
NEUT#: 5 10*3/uL (ref 1.5–6.5)
NEUT%: 63.1 % (ref 38.4–76.8)
Platelets: 307 10*3/uL (ref 145–400)
RBC: 5.16 10*6/uL (ref 3.70–5.45)
RDW: 13.3 % (ref 11.2–14.5)
WBC: 8 10*3/uL (ref 3.9–10.3)

## 2015-05-23 LAB — COMPREHENSIVE METABOLIC PANEL
ALBUMIN: 3.5 g/dL (ref 3.5–5.0)
ALK PHOS: 46 U/L (ref 40–150)
ALT: 20 U/L (ref 0–55)
AST: 18 U/L (ref 5–34)
Anion Gap: 11 mEq/L (ref 3–11)
BILIRUBIN TOTAL: 0.34 mg/dL (ref 0.20–1.20)
BUN: 28.9 mg/dL — AB (ref 7.0–26.0)
CALCIUM: 10.1 mg/dL (ref 8.4–10.4)
CO2: 30 mEq/L — ABNORMAL HIGH (ref 22–29)
CREATININE: 0.8 mg/dL (ref 0.6–1.1)
Chloride: 101 mEq/L (ref 98–109)
EGFR: 74 mL/min/{1.73_m2} — ABNORMAL LOW (ref >90)
Glucose: 83 mg/dl (ref 70–140)
Potassium: 3.9 mEq/L (ref 3.5–5.1)
Sodium: 142 mEq/L (ref 136–145)
Total Protein: 7.1 g/dL (ref 6.4–8.3)

## 2015-05-23 MED ORDER — DENOSUMAB 120 MG/1.7ML ~~LOC~~ SOLN
120.0000 mg | Freq: Once | SUBCUTANEOUS | Status: AC
  Administered 2015-05-23: 120 mg via SUBCUTANEOUS
  Filled 2015-05-23: qty 1.7

## 2015-06-11 ENCOUNTER — Ambulatory Visit
Admission: RE | Admit: 2015-06-11 | Discharge: 2015-06-11 | Disposition: A | Discharge status: Home / Self Care | Payer: Medicare Other | Source: Ambulatory Visit | Attending: Oncology | Admitting: Oncology

## 2015-06-11 DIAGNOSIS — N63 Unspecified lump in breast: Secondary | ICD-10-CM | POA: Diagnosis not present

## 2015-06-11 DIAGNOSIS — C7951 Secondary malignant neoplasm of bone: Secondary | ICD-10-CM

## 2015-06-11 DIAGNOSIS — C50919 Malignant neoplasm of unspecified site of unspecified female breast: Secondary | ICD-10-CM

## 2015-06-14 ENCOUNTER — Telehealth: Payer: Self-pay | Admitting: *Deleted

## 2015-06-14 NOTE — Telephone Encounter (Signed)
Call received in [facility] from pt stating concern related to pending bone scan.  Per discussion April Rosales understands bone scan is to restage for known breast cancer.  April Rosales stated " I really do not want to know if there is more cancer in my bones "  " I had the u/s last week on my breast and the lesion there is looking smaller"  " I know there is a small area in my back that I had surgery on and I wouldn't mind getting an MRI if he wants it for that area "  Per discussion including pt stating she overall feels good with no areas in her bone of concern.  This message will be sent to MD and RN at the desk for review and appropriate pt contact.  Pt did not want to cancel the bone scan until her concern is reviewed by the MD.  Return call number given as [phone].

## 2015-06-20 ENCOUNTER — Ambulatory Visit (HOSPITAL_BASED_OUTPATIENT_CLINIC_OR_DEPARTMENT_OTHER): Payer: Medicare Other

## 2015-06-20 ENCOUNTER — Other Ambulatory Visit (HOSPITAL_BASED_OUTPATIENT_CLINIC_OR_DEPARTMENT_OTHER): Payer: Medicare Other

## 2015-06-20 VITALS — BP 134/75 | HR 94 | Temp 98.5°F

## 2015-06-20 DIAGNOSIS — C50919 Malignant neoplasm of unspecified site of unspecified female breast: Secondary | ICD-10-CM

## 2015-06-20 DIAGNOSIS — C7951 Secondary malignant neoplasm of bone: Secondary | ICD-10-CM | POA: Diagnosis not present

## 2015-06-20 DIAGNOSIS — M810 Age-related osteoporosis without current pathological fracture: Secondary | ICD-10-CM

## 2015-06-20 DIAGNOSIS — C50912 Malignant neoplasm of unspecified site of left female breast: Secondary | ICD-10-CM

## 2015-06-20 LAB — COMPREHENSIVE METABOLIC PANEL
ALT: 14 U/L (ref 0–55)
ANION GAP: 8 meq/L (ref 3–11)
AST: 20 U/L (ref 5–34)
Albumin: 3.7 g/dL (ref 3.5–5.0)
Alkaline Phosphatase: 42 U/L (ref 40–150)
BILIRUBIN TOTAL: 0.3 mg/dL (ref 0.20–1.20)
BUN: 19.6 mg/dL (ref 7.0–26.0)
CO2: 30 meq/L — AB (ref 22–29)
Calcium: 10.4 mg/dL (ref 8.4–10.4)
Chloride: 103 mEq/L (ref 98–109)
Creatinine: 0.8 mg/dL (ref 0.6–1.1)
EGFR: 77 mL/min/{1.73_m2} — AB (ref >90)
GLUCOSE: 88 mg/dL (ref 70–140)
POTASSIUM: 3.9 meq/L (ref 3.5–5.1)
SODIUM: 142 meq/L (ref 136–145)
TOTAL PROTEIN: 7 g/dL (ref 6.4–8.3)

## 2015-06-20 LAB — CBC WITH DIFFERENTIAL/PLATELET
BASO%: 0.7 % (ref 0.0–2.0)
BASOS ABS: 0 10*3/uL (ref 0.0–0.1)
EOS ABS: 0.4 10*3/uL (ref 0.0–0.5)
EOS%: 8.1 % — ABNORMAL HIGH (ref 0.0–7.0)
HCT: 43.2 % (ref 34.8–46.6)
HGB: 15.1 g/dL (ref 11.6–15.9)
LYMPH%: 23.3 % (ref 14.0–49.7)
MCH: 32 pg (ref 25.1–34.0)
MCHC: 35 g/dL (ref 31.5–36.0)
MCV: 91.5 fL (ref 79.5–101.0)
MONO#: 0.6 10*3/uL (ref 0.1–0.9)
MONO%: 12.3 % (ref 0.0–14.0)
NEUT%: 55.6 % (ref 38.4–76.8)
NEUTROS ABS: 2.5 10*3/uL (ref 1.5–6.5)
PLATELETS: 244 10*3/uL (ref 145–400)
RBC: 4.72 10*6/uL (ref 3.70–5.45)
RDW: 13.2 % (ref 11.2–14.5)
WBC: 4.5 10*3/uL (ref 3.9–10.3)
lymph#: 1 10*3/uL (ref 0.9–3.3)

## 2015-06-20 MED ORDER — DENOSUMAB 120 MG/1.7ML ~~LOC~~ SOLN
120.0000 mg | Freq: Once | SUBCUTANEOUS | Status: AC
  Administered 2015-06-20: 120 mg via SUBCUTANEOUS
  Filled 2015-06-20: qty 1.7

## 2015-06-22 ENCOUNTER — Encounter (HOSPITAL_COMMUNITY): Payer: Medicare Other

## 2015-06-26 ENCOUNTER — Ambulatory Visit (HOSPITAL_BASED_OUTPATIENT_CLINIC_OR_DEPARTMENT_OTHER): Payer: Medicare Other | Admitting: Oncology

## 2015-06-26 ENCOUNTER — Telehealth: Payer: Self-pay | Admitting: Oncology

## 2015-06-26 VITALS — BP 145/68 | HR 93 | Temp 97.9°F | Resp 18 | Ht 62.0 in | Wt 121.1 lb

## 2015-06-26 DIAGNOSIS — C7951 Secondary malignant neoplasm of bone: Secondary | ICD-10-CM

## 2015-06-26 DIAGNOSIS — M545 Low back pain: Secondary | ICD-10-CM | POA: Diagnosis not present

## 2015-06-26 DIAGNOSIS — I890 Lymphedema, not elsewhere classified: Secondary | ICD-10-CM

## 2015-06-26 DIAGNOSIS — C50912 Malignant neoplasm of unspecified site of left female breast: Secondary | ICD-10-CM | POA: Diagnosis not present

## 2015-06-26 NOTE — Progress Notes (Signed)
[place]  Telephone:([phone]) [phone] Fax:(336) [phone]     ID: [name] DOB: 08-08-1940  MR#: [id number]  YJE#:[id number]  Patient Care Team: [name], MD as PCP - General (Internal Medicine) Chauncey Cruel, MD as Consulting Physician (Oncology) PCP: [name], MD GYN: SU: [name] M.D. OTHER MD:  Rodell Perna M.D., [name] MD, Stephannie Peters M.D.  CHIEF COMPLAINT: Stage IV breast cancer  CURRENT TREATMENT:  Letrozole; denosumab   BREAST CANCER HISTORY: From the original intake note:    April Rosales underwent right lumpectomy in 1985 at [facility] for what sounds like a stage I breast cancer. She tells me she had more than 30 lymph nodes removed from her right axilla and all of them were clear. She received  adjuvant radiation but no systemic treatment.  The patient had recently refused mammography with the last mammogram I can find dating back to August 2010.  More recently the patient presented with left scapular pain radiating down the left arm.  She was evaluated by Dr. [name], who obtained a chest x-ray showing a possible abnormality at T3. He then set up the patient for an MRI of the thoracic spine performed 08/16/2014.  This showed multiple compression fractures  (a similar picture had been noted on lumbar MRI 10/09/2011 ). However at T3 they noted tumor in the vertebral body extending into the left pedicle and into the lateral epidural space, displacing the cord to the right. There was no evidence of cord compression or cord signal abnormality. There were no other areas of tumor identified in the thoracic spine.         The patient was then referred to Dr. Hal Neer who on 08/24/2014 set April Rosales up for CT scans of the chest, abdomen and pelvis. There was a dense mass in the upper inner quadrant of the left breast measuring 1.6 cm. There was a 1.2 cm nodule in the minor fissure of the right lung and some evidence of right  lung fibrosis at the site of the prior radiation port.  There was also a thyroid mass measuring 2 cm. However there were no parenchymal lung or liver lesions. Incidental meningoceles were noted as well as sclerosis of the fifth and sixth ribs which were felt to be likely posttraumatic.   On 08/29/2014 the patient underwent bilateral diagnostic mammography with tomosynthesis and left breast ultrasonography.  There were postsurgical changes in the upper right breast. In the left breast there was an irregular mass measuring 2.3 cm in the upper inner quadrant. This was palpable. Ultrasound showed this to be hypoechoic and to measure 2.0 cm. There were adjacent areas of nodularity. There was no definite lymphadenopathy in the left axilla.  Biopsy of this breast mass 08/29/2014 showed an invasive adenocarcinoma with both ductal and lobular features (there was strong diffuse E-cadherin expression as well as areas with total absence of E-cadherin expression), with the preliminary prognostic profile showing strong estrogen positivity, very weak to near absent progesterone positivity, an MIB-1 of approximately 40%, and HER-2 equivocal  The patient's subsequent history is as detailed below.  INTERVAL HISTORY:  April Rosales returns today for follow-up of her estrogen receptor positive breast cancer accompanied by her sister [name].April Rosales continues on letrozole, which she is tolerating quite well. Hot flashes and vaginal dryness are not a problem. She is currently obtaining the drug and approximately $1 per month. She continues on monthly Denosumab with excellent tolerance  REVIEW OF SYSTEMS: She feels fine, to get  easy over the holidays, and is doing all her normal daytime activities including driving, shopping, cooking, and cleaning, with no particular symptoms related to her cancer or its treatment. She feels "okay". She just does not like to exercise. She has minimal low back pain which is not more intense or  persistent than before. She has had grade 1 right upper extremity lymphedema for decades and does not want any physical therapy for that. A detailed review of systems today was otherwise stable  PAST MEDICAL HISTORY: Past Medical History  Diagnosis Date  . Osteoporosis     takes Vit D  . Family history of ovarian cancer   . Breast cancer [facility]) 1985/2016    takes [name] daily  . Arthritis   . Chronic back pain     stenosis/listhesis  . Radiation 11/23/14-12/11/14    30 Gy T1-T5     PAST SURGICAL HISTORY: Past Surgical History  Procedure Laterality Date  . Appendectomy  1977  . Abdominal hysterectomy      1980  . Thyroid cyst excision  1967  . Breast surgery Right 1985  . Application of intraoperative ct scan N/A 10/20/2014    Procedure: APPLICATION OF INTRAOPERATIVE CAT SCAN;  Surgeon: [name], MD;  Location: [place] [facility];  Service: Neurosurgery;  Laterality: N/A;    FAMILY HISTORY Family History  Problem Relation Age of Onset  . Heart disease Mother   . Hypertension Mother   . Heart disease Father   . Diabetes Father   . Breast cancer Paternal Aunt     4 paternal aunts with breast cancer over 59  . Prostate cancer Paternal Uncle   . Stroke Paternal Grandfather   . Ovarian cancer Paternal Aunt   . Huntington's disease Other     Nephew, inherited from his father   The patient's father died from a heart attack at the age of 29. He had 9 sisters. 3 of those sisters had breast cancer, all in a menopausal setting. Another sister had ovarian cancer. One of the paternal uncles had cancer of the colon "and back".  The patient's mother died at the age of 55. She was found to have breast cancer shortly before dying , during her final hospitalization.  GYNECOLOGIC HISTORY:  No LMP recorded. Patient has had a hysterectomy.  Menarche age 54, first live birth age 30, the patient is GX P1. She underwent hysterectomy in 1980. She thinks the ovaries were removed, but the CT scan  obtained 08/24/2014 showed a definite right ovary. The left ovary may have been removed. She did not take hormone replacement after the hysterectomy.  SOCIAL HISTORY:   Rande worked in Psychiatric nurse. She is divorced, lives alone, with 2 cats. Her son Arnette Norris sharp lives in [place] where he works in Engineer, technical sales. He has 4 children of his own. The patient is a Psychologist, forensic.    ADVANCED DIRECTIVES:  In place. The patient has named her sister April Rosales [phone]) and her friend April Rosales [phone]) as joint healthcare powers of attorney   HEALTH MAINTENANCE: Social History  Substance Use Topics  . Smoking status: Former Smoker -- 0.50 packs/day for 10 years    Types: Cigarettes  . Smokeless tobacco: Former Systems developer     Comment: quit smoking 1970's  . Alcohol Use: 0.0 oz/week    0 Standard drinks or equivalent per week     Comment: Rare     Colonoscopy: never  PAP: status post hysterectomy  Bone density: 08/14/2014 at [place], results  pending  Lipid panel:  Allergies  Allergen Reactions  . Ativan [Lorazepam]     Made her crazy  . Dilaudid [Hydromorphone Hcl]     Doesn't want  . Haldol [Haloperidol Lactate]     Made her crazy    Current Outpatient Prescriptions  Medication Sig Dispense Refill  . Ascorbic Acid (VITAMIN C PO) Take 1 tablet by mouth every other day. Takes qod    . aspirin EC 325 MG tablet Take 1 tablet (325 mg total) by mouth daily. 30 tablet 0  . calcium carbonate (TUMS) 500 MG chewable tablet Chew 2 tablets by mouth 2 (two) times daily.     . Cholecalciferol (VITAMIN D3 ADULT GUMMIES PO) Take 4,000 Units by mouth daily.    . Cyanocobalamin (VITAMIN B-12 PO) Take by mouth daily. Takes every 3 days    . Iron-Vitamins (GERITOL PO) Take by mouth daily.    Marland Kitchen letrozole (FEMARA) 2.5 MG tablet Take 1 tablet (2.5 mg total) by mouth daily. 90 tablet 12   No current facility-administered medications for this visit.    OBJECTIVE:  Older white woman who appears  well Filed Vitals:   06/26/15 1403  BP: 145/68  Pulse: 93  Temp: 97.9 F (36.6 C)  Resp: 18     Body mass index is 22.14 kg/(m^2).    ECOG FS:1 - Symptomatic but completely ambulatory  Sclerae unicteric, pupils round and equal Oropharynx clear and moist-- no thrush or other lesions No cervical or supraclavicular adenopathy Lungs no rales or rhonchi Heart regular rate and rhythm Abd soft, nontender, positive bowel sounds MSK scoliosis but no focal spinal tenderness, no upper extremity lymphedema Neuro: nonfocal, well oriented, appropriate affect Breasts: The right breast is status post lumpectomy, with no evidence of local recurrence. The right axilla was benign. In the left breast and no longer palpate a mass in the upper inner quadrant. There is no erythema or other skin change of concern. The left axilla is benign.   LAB RESULTS:  CMP     Component Value Date/Time   NA 142 06/20/2015 1347   NA 138 10/20/2014 1317   K 3.9 06/20/2015 1347   K 4.2 10/20/2014 1317   CL 103 10/13/2014 1427   CO2 30* 06/20/2015 1347   CO2 29 10/13/2014 1427   GLUCOSE 88 06/20/2015 1347   GLUCOSE 104* 10/13/2014 1427   BUN 19.6 06/20/2015 1347   BUN 10 10/13/2014 1427   CREATININE 0.8 06/20/2015 1347   CREATININE 0.56 10/13/2014 1427   CREATININE 0.59 08/21/2014 1713   CALCIUM 10.4 06/20/2015 1347   CALCIUM 10.5* 10/13/2014 1427   PROT 7.0 06/20/2015 1347   PROT 6.7 08/21/2014 1713   ALBUMIN 3.7 06/20/2015 1347   ALBUMIN 4.2 08/21/2014 1713   AST 20 06/20/2015 1347   AST 18 08/21/2014 1713   ALT 14 06/20/2015 1347   ALT 13 08/21/2014 1713   ALKPHOS 42 06/20/2015 1347   ALKPHOS 53 08/21/2014 1713   BILITOT 0.30 06/20/2015 1347   BILITOT 0.4 08/21/2014 1713   GFRNONAA >60 10/13/2014 1427   GFRNONAA >89 08/21/2014 1713   GFRAA >60 10/13/2014 1427   GFRAA >89 08/21/2014 1713    INo results found for: SPEP, UPEP  Lab Results  Component Value Date   WBC 4.5 06/20/2015   NEUTROABS  2.5 06/20/2015   HGB 15.1 06/20/2015   HCT 43.2 06/20/2015   MCV 91.5 06/20/2015   PLT 244 06/20/2015      Chemistry  Component Value Date/Time   NA 142 06/20/2015 1347   NA 138 10/20/2014 1317   K 3.9 06/20/2015 1347   K 4.2 10/20/2014 1317   CL 103 10/13/2014 1427   CO2 30* 06/20/2015 1347   CO2 29 10/13/2014 1427   BUN 19.6 06/20/2015 1347   BUN 10 10/13/2014 1427   CREATININE 0.8 06/20/2015 1347   CREATININE 0.56 10/13/2014 1427   CREATININE 0.59 08/21/2014 1713      Component Value Date/Time   CALCIUM 10.4 06/20/2015 1347   CALCIUM 10.5* 10/13/2014 1427   ALKPHOS 42 06/20/2015 1347   ALKPHOS 53 08/21/2014 1713   AST 20 06/20/2015 1347   AST 18 08/21/2014 1713   ALT 14 06/20/2015 1347   ALT 13 08/21/2014 1713   BILITOT 0.30 06/20/2015 1347   BILITOT 0.4 08/21/2014 1713       Lab Results  Component Value Date   LABCA2 38 09/20/2014    No components found for: HALPF790  No results for input(s): INR in the last 168 hours.  Urinalysis No results found for: COLORURINE, APPEARANCEUR, [place], [place], [place], [place], [place], [place], [place], [place], [place], [place]  06/11/2015  CLINICAL DATA:  Followup left breast cancer treated with chemotherapy, anti-hormonal therapy and radiation therapy. EXAM: ULTRASOUND OF THE LEFT BREAST COMPARISON:  Previous examinations, the most recent dated 02/02/2015. FINDINGS: On physical exam, there is diffuse firm, palpable soft tissue thickening in the upper inner left breast with no discrete palpable mass. Targeted ultrasound is performed, showing a 0.7 x 0.6 x 0.3 cm irregular, hypoechoic mass in the 10 o'clock position of the left breast, 5 cm from the nipple. This measured 1.4 x 1.1 x 0.7 cm on 02/02/2015 and 1.6 x 1.3 x 1.2 cm on 08/29/2014. IMPRESSION: Significant further decrease in size of the patient's known malignancy in the 10 o'clock position of the left  breast. RECOMMENDATION: Treatment plan. I have discussed the findings and recommendations with the patient. Results were also provided in writing at the conclusion of the visit. If applicable, a reminder letter will be sent to the patient regarding the next appointment. BI-RADS CATEGORY  6: Known biopsy-proven malignancy. Electronically Signed   By: Claudie Revering M.D.   On: 06/11/2015 12:20    ASSESSMENT: 75 y.o. [place] woman with stage IV left breast cancer involving bone  (1) status post right lumpectomy and axillary node dissection in 1985 followed by radiation at [facility] 08/16/2014: measurable disease in spine, lung and left breast   (2) evaluation for left shoulder pain led to thoracic spine MRI 08/16/2014 showing a pathologic fracture at T3 with epidural tumor displacing the cord to the right, but no cord compression. CT scans of the chest, abdomen and pelvis 08/24/2014 showed in addition a mass in the upper left breast measuring 1.6 cm and a nodule in the minor fissure of the right lung measuring 1.2 cm, but no parenchymal lung or liver lesions. CA 27-29 was noninformative at 38 (09/20/2014)    (3) mammography and ultrasonography 08/29/2014 show a mass in the upper inner left breast which was palpable,  measuring 2.0 cm by ultrasound. Biopsy of this mass 08/29/2014 showed an invasive breast cancer with both lobular and ductal features, estrogen receptor positive, progesterone receptor weakly positive, with an MIB-1 in the 40% range, HER-2 equivocal (6 else ratio 1.5, but average number her nucleus 5.8)   (4) letrozole started 08/31/2014;   (a) palbociclib added Sept 2016  at 75 mg 21/7, with significant neutropenia; not repeated after first cycle   (5)  zolendronate started 09/20/2014, rstopped after initial dose due to poor tolerance  (a) denosumab/ Xgeva started 01/31/2015, repeated every 4 weeks   (6) on 10/20/2014 the patient underwent T2-T3 and T4 decompressive  laminectomy with removal of epidural tumor, C7-T4 segmental pedicle screw instrumentation with virage screw system with arrow guidance protocol and C7-T4 posterolateral fusion. The cells were positive for the estrogen receptor. HER-2/neu testing by [facility] showed again equivocal results,  (7) radiation 11/23/2014-12/11/2014.  (a) T1-T5 was treated to 30 Gy in 12 fractions at 2.5 Gy per fraction   (8)  consider eventual left lumpectomy or mastectomy depending on  longer-term results of systemic therapy  (9) genetics testing using the Breast/Ovarian Cancer Panel through [facility] [name], MD) found no mutations in ATM, BARD1, BRCA1, BRCA2, BRIP1, CDH1, CHEK2, EPCAM, FANCC, MLH1, MSH2, MSH6, NBN, PALB2, PMS2, PTEN, RAD51C, RAD51D, STK11, TP53, or XRCC2    PLAN: Nyree is having an excellent response to letrozole, with a 50% shrinkage of her measurable disease in the left breast. She is also tolerating it well in obtaining it at an excellent price.  The question is what is happening in the rest of her body. I did set her up for a bone scan but she canceled that because she did not want "radiation in her body." We discussed this at length today and she understands the radiation states in her body a very short time but it does allow [place] to see which bone areas are involved at present. I had hoped to be able to switch her denosumab to every other month with that new baseline.  After approximately 30 minutes of discussion and clarification she does not want a bone scan but she does agree to a CT scan of the chest. That will be obtained at just before her next visit here which will be February 21. She had at baseline what probably was a lymph node in the right lung fissure which hopefully has resolved. This will also give [place] some idea of what is going in the area of her bones where she had the radiation.  Depending on the results at that visit we will decide whether to move her denosumab to every 2 months after  the femora 22nd 2017 visit.  At this point we are not planning on surgery. If there is evidence of the tumor growing again I think lumpectomy would be considered  Dyanna is in agreement with this plan. She will let us know if any other problems develop before her next visit here.  Chauncey Cruel, MD   06/26/2015 2:34 PM Medical Oncology and Hematology [facility] [address] [place], [place] [address] Tel. [phone]    Fax. [phone]

## 2015-06-26 NOTE — Telephone Encounter (Signed)
Called patient and she is aware of her appts

## 2015-06-28 ENCOUNTER — Ambulatory Visit: Payer: Self-pay | Admitting: Physician Assistant

## 2015-07-05 ENCOUNTER — Ambulatory Visit: Payer: Self-pay | Admitting: Physician Assistant

## 2015-07-13 ENCOUNTER — Ambulatory Visit (HOSPITAL_COMMUNITY)
Admission: RE | Admit: 2015-07-13 | Discharge: 2015-07-13 | Disposition: A | Discharge status: Home / Self Care | Payer: Medicare Other | Source: Ambulatory Visit | Attending: Oncology | Admitting: Oncology

## 2015-07-13 ENCOUNTER — Encounter (HOSPITAL_COMMUNITY): Payer: Self-pay

## 2015-07-13 DIAGNOSIS — Z9889 Other specified postprocedural states: Secondary | ICD-10-CM | POA: Diagnosis not present

## 2015-07-13 DIAGNOSIS — M4854XA Collapsed vertebra, not elsewhere classified, thoracic region, initial encounter for fracture: Secondary | ICD-10-CM | POA: Diagnosis not present

## 2015-07-13 DIAGNOSIS — C50919 Malignant neoplasm of unspecified site of unspecified female breast: Secondary | ICD-10-CM | POA: Diagnosis not present

## 2015-07-13 DIAGNOSIS — C50912 Malignant neoplasm of unspecified site of left female breast: Secondary | ICD-10-CM

## 2015-07-13 DIAGNOSIS — Z981 Arthrodesis status: Secondary | ICD-10-CM | POA: Insufficient documentation

## 2015-07-13 DIAGNOSIS — R911 Solitary pulmonary nodule: Secondary | ICD-10-CM | POA: Diagnosis not present

## 2015-07-13 DIAGNOSIS — C7951 Secondary malignant neoplasm of bone: Secondary | ICD-10-CM | POA: Diagnosis not present

## 2015-07-13 DIAGNOSIS — R918 Other nonspecific abnormal finding of lung field: Secondary | ICD-10-CM | POA: Diagnosis not present

## 2015-07-13 MED ORDER — IOHEXOL 300 MG/ML  SOLN
75.0000 mL | Freq: Once | INTRAMUSCULAR | Status: AC | PRN
  Administered 2015-07-13: 75 mL via INTRAVENOUS

## 2015-07-16 ENCOUNTER — Other Ambulatory Visit: Payer: Self-pay | Admitting: *Deleted

## 2015-07-16 ENCOUNTER — Telehealth: Payer: Self-pay | Admitting: Oncology

## 2015-07-16 NOTE — Telephone Encounter (Signed)
Spoke with pt to confirm appts for 2/21 per 2/20 pof

## 2015-07-17 ENCOUNTER — Other Ambulatory Visit (HOSPITAL_BASED_OUTPATIENT_CLINIC_OR_DEPARTMENT_OTHER): Payer: Medicare Other

## 2015-07-17 ENCOUNTER — Ambulatory Visit (HOSPITAL_BASED_OUTPATIENT_CLINIC_OR_DEPARTMENT_OTHER): Payer: Medicare Other

## 2015-07-17 ENCOUNTER — Ambulatory Visit (HOSPITAL_BASED_OUTPATIENT_CLINIC_OR_DEPARTMENT_OTHER): Payer: Medicare Other | Admitting: Nurse Practitioner

## 2015-07-17 ENCOUNTER — Telehealth: Payer: Self-pay | Admitting: Oncology

## 2015-07-17 ENCOUNTER — Encounter: Payer: Self-pay | Admitting: Nurse Practitioner

## 2015-07-17 VITALS — BP 147/77 | HR 115 | Temp 97.7°F | Resp 18 | Wt 124.3 lb

## 2015-07-17 DIAGNOSIS — C50912 Malignant neoplasm of unspecified site of left female breast: Secondary | ICD-10-CM | POA: Diagnosis not present

## 2015-07-17 DIAGNOSIS — C50919 Malignant neoplasm of unspecified site of unspecified female breast: Secondary | ICD-10-CM

## 2015-07-17 DIAGNOSIS — C7951 Secondary malignant neoplasm of bone: Secondary | ICD-10-CM

## 2015-07-17 DIAGNOSIS — M545 Low back pain: Secondary | ICD-10-CM | POA: Diagnosis not present

## 2015-07-17 DIAGNOSIS — G8929 Other chronic pain: Secondary | ICD-10-CM | POA: Diagnosis not present

## 2015-07-17 DIAGNOSIS — I890 Lymphedema, not elsewhere classified: Secondary | ICD-10-CM

## 2015-07-17 DIAGNOSIS — M810 Age-related osteoporosis without current pathological fracture: Secondary | ICD-10-CM

## 2015-07-17 LAB — CBC WITH DIFFERENTIAL/PLATELET
BASO%: 0.8 % (ref 0.0–2.0)
Basophils Absolute: 0 10*3/uL (ref 0.0–0.1)
EOS%: 6 % (ref 0.0–7.0)
Eosinophils Absolute: 0.4 10*3/uL (ref 0.0–0.5)
HEMATOCRIT: 44.6 % (ref 34.8–46.6)
HEMOGLOBIN: 14.8 g/dL (ref 11.6–15.9)
LYMPH#: 1 10*3/uL (ref 0.9–3.3)
LYMPH%: 16.4 % (ref 14.0–49.7)
MCH: 30.6 pg (ref 25.1–34.0)
MCHC: 33.3 g/dL (ref 31.5–36.0)
MCV: 92 fL (ref 79.5–101.0)
MONO#: 0.4 10*3/uL (ref 0.1–0.9)
MONO%: 7 % (ref 0.0–14.0)
NEUT%: 69.8 % (ref 38.4–76.8)
NEUTROS ABS: 4.2 10*3/uL (ref 1.5–6.5)
Platelets: 236 10*3/uL (ref 145–400)
RBC: 4.85 10*6/uL (ref 3.70–5.45)
RDW: 13.6 % (ref 11.2–14.5)
WBC: 6 10*3/uL (ref 3.9–10.3)

## 2015-07-17 LAB — COMPREHENSIVE METABOLIC PANEL
ALBUMIN: 3.8 g/dL (ref 3.5–5.0)
ALT: 14 U/L (ref 0–55)
ANION GAP: 10 meq/L (ref 3–11)
AST: 20 U/L (ref 5–34)
Alkaline Phosphatase: 44 U/L (ref 40–150)
BILIRUBIN TOTAL: 0.35 mg/dL (ref 0.20–1.20)
BUN: 14.4 mg/dL (ref 7.0–26.0)
CALCIUM: 10.6 mg/dL — AB (ref 8.4–10.4)
CHLORIDE: 105 meq/L (ref 98–109)
CO2: 28 meq/L (ref 22–29)
Creatinine: 0.8 mg/dL (ref 0.6–1.1)
EGFR: 77 mL/min/{1.73_m2} — AB (ref >90)
Glucose: 108 mg/dl (ref 70–140)
Potassium: 4.2 mEq/L (ref 3.5–5.1)
SODIUM: 143 meq/L (ref 136–145)
TOTAL PROTEIN: 7 g/dL (ref 6.4–8.3)

## 2015-07-17 MED ORDER — DENOSUMAB 120 MG/1.7ML ~~LOC~~ SOLN
120.0000 mg | Freq: Once | SUBCUTANEOUS | Status: AC
  Administered 2015-07-17: 120 mg via SUBCUTANEOUS
  Filled 2015-07-17: qty 1.7

## 2015-07-17 MED ORDER — SODIUM CHLORIDE 0.9 % IJ SOLN
10.0000 mL | INTRAMUSCULAR | Status: DC | PRN
  Filled 2015-07-17: qty 10

## 2015-07-17 MED ORDER — ALTEPLASE 2 MG IJ SOLR
2.0000 mg | Freq: Once | INTRAMUSCULAR | Status: DC | PRN
  Filled 2015-07-17: qty 2

## 2015-07-17 MED ORDER — HEPARIN SOD (PORK) LOCK FLUSH 100 UNIT/ML IV SOLN
500.0000 [IU] | Freq: Once | INTRAVENOUS | Status: DC | PRN
  Filled 2015-07-17: qty 5

## 2015-07-17 MED ORDER — HEPARIN SOD (PORK) LOCK FLUSH 100 UNIT/ML IV SOLN
250.0000 [IU] | Freq: Once | INTRAVENOUS | Status: DC | PRN
  Filled 2015-07-17: qty 5

## 2015-07-17 MED ORDER — SODIUM CHLORIDE 0.9 % IJ SOLN
3.0000 mL | Freq: Once | INTRAMUSCULAR | Status: DC | PRN
  Filled 2015-07-17: qty 10

## 2015-07-17 NOTE — Progress Notes (Signed)
[place]  [phone]) [phone] [phone]) [phone]   ID: April Rosales DOB: [birth year]  MR#: [id number]  QMG#:[id number]  Patient Care Team: [name], MD as PCP - General (Internal Medicine) Chauncey Cruel, MD as Consulting Physician (Oncology) PCP: [name], MD GYN: SU: [name] M.D. OTHER MD:  Rodell Perna M.D., [name] MD, Stephannie Peters M.D.  CHIEF COMPLAINT: Stage IV breast cancer  CURRENT TREATMENT:  Letrozole; denosumab  BREAST CANCER HISTORY: From the original intake note:   [name] underwent right lumpectomy in 1985 at [facility] for what sounds like a stage I breast cancer. She tells me she had more than 30 lymph nodes removed from her right axilla and all of them were clear. She received  adjuvant radiation but no systemic treatment.  The patient had recently refused mammography with the last mammogram I can find dating back to August 2010.  More recently the patient presented with left scapular pain radiating down the left arm.  She was evaluated by Dr. [name], who obtained a chest x-ray showing a possible abnormality at T3. He then set up the patient for an MRI of the thoracic spine performed 08/16/2014.  This showed multiple compression fractures  (a similar picture had been noted on lumbar MRI 10/09/2011 ). However at T3 they noted tumor in the vertebral body extending into the left pedicle and into the lateral epidural space, displacing the cord to the right. There was no evidence of cord compression or cord signal abnormality. There were no other areas of tumor identified in the thoracic spine.         The patient was then referred to Dr. Hal Neer who on 08/24/2014 set [name] up for CT scans of the chest, abdomen and pelvis. There was a dense mass in the upper inner quadrant of the left breast measuring 1.6 cm. There was a 1.2 cm nodule in the minor fissure of the right lung and some evidence of right lung  fibrosis at the site of the prior radiation port.  There was also a thyroid mass measuring 2 cm. However there were no parenchymal lung or liver lesions. Incidental meningoceles were noted as well as sclerosis of the fifth and sixth ribs which were felt to be likely posttraumatic.   On 08/29/2014 the patient underwent bilateral diagnostic mammography with tomosynthesis and left breast ultrasonography.  There were postsurgical changes in the upper right breast. In the left breast there was an irregular mass measuring 2.3 cm in the upper inner quadrant. This was palpable. Ultrasound showed this to be hypoechoic and to measure 2.0 cm. There were adjacent areas of nodularity. There was no definite lymphadenopathy in the left axilla.  Biopsy of this breast mass 08/29/2014 showed an invasive adenocarcinoma with both ductal and lobular features (there was strong diffuse E-cadherin expression as well as areas with total absence of E-cadherin expression), with the preliminary prognostic profile showing strong estrogen positivity, very weak to near absent progesterone positivity, an MIB-1 of approximately 40%, and HER-2 equivocal  The patient's subsequent history is as detailed below.  INTERVAL HISTORY: April Rosales returns today for follow-up of her estrogen receptor positive breast cancer, accompanied by her sister [name]. She has been on letrozole daily since April of last year and tolerates this well with no side effects that she is aware of. She denies hot flashes, vaginal dryness, or increased arthralgias or myalgias. She receives denosumab every 4 weeks with no issues.   REVIEW OF SYSTEMS: [name] denies fevers,  chills, nausea, vomiting, or changes in bowel or bladder habits. New for the past few weeks has been achy shoulder and upper arm muscles, right greater than left. She has grade 1 right upper extremity lymphedema, and is not interest in physical therapy. She has chronic back pain. Her energy level and  appetite are good. She denies shortness of breath, chest pain, cough, or palpitations. A detailed review of systems is otherwise stable.   PAST MEDICAL HISTORY: Past Medical History  Diagnosis Date  . Osteoporosis     takes Vit D  . Family history of ovarian cancer   . Arthritis   . Chronic back pain     stenosis/listhesis  . Radiation 11/23/14-12/11/14    30 Gy T1-T5   . Breast cancer ([place]) 1985/2016    takes [name] daily    PAST SURGICAL HISTORY: Past Surgical History  Procedure Laterality Date  . Appendectomy  1977  . Abdominal hysterectomy      1980  . Thyroid cyst excision  1967  . Breast surgery Right 1985  . Application of intraoperative ct scan N/A 10/20/2014    Procedure: APPLICATION OF INTRAOPERATIVE CAT SCAN;  Surgeon: [name], MD;  Location: [facility];  Service: Neurosurgery;  Laterality: N/A;    FAMILY HISTORY Family History  Problem Relation Age of Onset  . Heart disease Mother   . Hypertension Mother   . Heart disease Father   . Diabetes Father   . Breast cancer Paternal Aunt     4 paternal aunts with breast cancer over 66  . Prostate cancer Paternal Uncle   . Stroke Paternal Grandfather   . Ovarian cancer Paternal Aunt   . Huntington's disease Other     Nephew, inherited from his father   The patient's father died from a heart attack at the age of 39. He had 9 sisters. 3 of those sisters had breast cancer, all in a menopausal setting. Another sister had ovarian cancer. One of the paternal uncles had cancer of the colon "and back".  The patient's mother died at the age of 22. She was found to have breast cancer shortly before dying , during her final hospitalization.  GYNECOLOGIC HISTORY:  No LMP recorded. Patient has had a hysterectomy.  Menarche age 22, first live birth age 61, the patient is GX P1. She underwent hysterectomy in 1980. She thinks the ovaries were removed, but the CT scan obtained 08/24/2014 showed a definite right ovary. The left  ovary may have been removed. She did not take hormone replacement after the hysterectomy.  SOCIAL HISTORY:   [name] worked in Psychiatric nurse. She is divorced, lives alone, with 2 cats. Her son Arnette Norris sharp lives in [place] where he works in Engineer, technical sales. He has 4 children of his own. The patient is a Psychologist, forensic.    ADVANCED DIRECTIVES:  In place. The patient has named her sister Pleas Koch [phone]) and her friend Janina Mayo [phone]) as joint healthcare powers of attorney   HEALTH MAINTENANCE: Social History  Substance Use Topics  . Smoking status: Former Smoker -- 0.50 packs/day for 10 years    Types: Cigarettes  . Smokeless tobacco: Former Systems developer     Comment: quit smoking 1970's  . Alcohol Use: 0.0 oz/week    0 Standard drinks or equivalent per week     Comment: Rare     Colonoscopy: never  PAP: status post hysterectomy  Bone density: 08/14/2014 at [facility], results pending  Lipid panel:  Allergies  Allergen Reactions  .  Ativan [Lorazepam]     Made her crazy  . Dilaudid [Hydromorphone Hcl]     Doesn't want  . Haldol [Haloperidol Lactate]     Made her crazy    Current Outpatient Prescriptions  Medication Sig Dispense Refill  . Ascorbic Acid (VITAMIN C PO) Take 1 tablet by mouth every other day. Takes qod    . aspirin EC 325 MG tablet Take 1 tablet (325 mg total) by mouth daily. 30 tablet 0  . calcium carbonate (TUMS) 500 MG chewable tablet Chew 2 tablets by mouth 2 (two) times daily.     . Cholecalciferol (VITAMIN D3 ADULT GUMMIES PO) Take 4,000 Units by mouth daily.    . Cyanocobalamin (VITAMIN B-12 PO) Take by mouth daily. Takes every other day    . Iron-Vitamins (GERITOL PO) Take by mouth daily.    Marland Kitchen letrozole (FEMARA) 2.5 MG tablet Take 1 tablet (2.5 mg total) by mouth daily. 90 tablet 12   No current facility-administered medications for this visit.   Facility-Administered Medications Ordered in Other Visits  Medication Dose Route Frequency Provider  Last Rate Last Dose  . alteplase (CATHFLO ACTIVASE) injection 2 mg  2 mg Intracatheter Once PRN Laurie Panda, NP      . heparin lock flush 100 unit/mL  500 Units Intracatheter Once PRN Laurie Panda, NP      . heparin lock flush 100 unit/mL  250 Units Intracatheter Once PRN Laurie Panda, NP      . sodium chloride 0.9 % injection 10 mL  10 mL Intracatheter PRN Heather F Boelter, NP      . sodium chloride 0.9 % injection 3 mL  3 mL Intravenous Once PRN Laurie Panda, NP        OBJECTIVE:  Older white woman who appears well Filed Vitals:   07/17/15 1454  BP: 147/77  Pulse: 115  Temp: 97.7 F (36.5 C)  Resp: 18     Body mass index is 22.73 kg/(m^2).    ECOG FS:1 - Symptomatic but completely ambulatory  Skin: warm, dry  HEENT: sclerae anicteric, conjunctivae pink, oropharynx clear. No thrush or mucositis.  Lymph Nodes: No cervical or supraclavicular lymphadenopathy  Lungs: clear to auscultation bilaterally, no rales, wheezes, or rhonci  Heart: regular rate and rhythm  Abdomen: round, soft, non tender, positive bowel sounds  Musculoskeletal: No focal spinal tenderness, grade 1 right upper extremity lymphedema.  Neuro: non focal, well oriented, positive affect  Breasts: deferred  LAB RESULTS:  CMP     Component Value Date/Time   NA 143 07/17/2015 1442   NA 138 10/20/2014 1317   K 4.2 07/17/2015 1442   K 4.2 10/20/2014 1317   CL 103 10/13/2014 1427   CO2 28 07/17/2015 1442   CO2 29 10/13/2014 1427   GLUCOSE 108 07/17/2015 1442   GLUCOSE 104* 10/13/2014 1427   BUN 14.4 07/17/2015 1442   BUN 10 10/13/2014 1427   CREATININE 0.8 07/17/2015 1442   CREATININE 0.56 10/13/2014 1427   CREATININE 0.59 08/21/2014 1713   CALCIUM 10.6* 07/17/2015 1442   CALCIUM 10.5* 10/13/2014 1427   PROT 7.0 07/17/2015 1442   PROT 6.7 08/21/2014 1713   ALBUMIN 3.8 07/17/2015 1442   ALBUMIN 4.2 08/21/2014 1713   AST 20 07/17/2015 1442   AST 18 08/21/2014 1713   ALT 14 07/17/2015  1442   ALT 13 08/21/2014 1713   ALKPHOS 44 07/17/2015 1442   ALKPHOS 53 08/21/2014 1713   BILITOT 0.35 07/17/2015  1442   BILITOT 0.4 08/21/2014 1713   GFRNONAA >60 10/13/2014 1427   GFRNONAA >89 08/21/2014 1713   GFRAA >60 10/13/2014 1427   GFRAA >89 08/21/2014 1713    INo results found for: SPEP, UPEP  Lab Results  Component Value Date   WBC 6.0 07/17/2015   NEUTROABS 4.2 07/17/2015   HGB 14.8 07/17/2015   HCT 44.6 07/17/2015   MCV 92.0 07/17/2015   PLT 236 07/17/2015      Chemistry      Component Value Date/Time   NA 143 07/17/2015 1442   NA 138 10/20/2014 1317   K 4.2 07/17/2015 1442   K 4.2 10/20/2014 1317   CL 103 10/13/2014 1427   CO2 28 07/17/2015 1442   CO2 29 10/13/2014 1427   BUN 14.4 07/17/2015 1442   BUN 10 10/13/2014 1427   CREATININE 0.8 07/17/2015 1442   CREATININE 0.56 10/13/2014 1427   CREATININE 0.59 08/21/2014 1713      Component Value Date/Time   CALCIUM 10.6* 07/17/2015 1442   CALCIUM 10.5* 10/13/2014 1427   ALKPHOS 44 07/17/2015 1442   ALKPHOS 53 08/21/2014 1713   AST 20 07/17/2015 1442   AST 18 08/21/2014 1713   ALT 14 07/17/2015 1442   ALT 13 08/21/2014 1713   BILITOT 0.35 07/17/2015 1442   BILITOT 0.4 08/21/2014 1713       Lab Results  Component Value Date   LABCA2 38 09/20/2014    No components found for: [id number]  No results for input(s): INR in the last 168 hours.  Urinalysis No results found for: COLORURINE, APPEARANCEUR, LABSPEC, [place], GLUCOSEU, [employer], [place], [place], PROTEINUR, UROBILINOGEN, NITRITE, LEUKOCYTESUR  STUDIES:Ct Chest W Contrast  07/13/2015  CLINICAL DATA:  Metastatic breast cancer with osseous lesions in the cervical thoracic spine status post tumor resection and radiation therapy. EXAM: CT CHEST WITH CONTRAST TECHNIQUE: Multidetector CT imaging of the chest was performed during intravenous contrast administration. CONTRAST:  71m OMNIPAQUE IOHEXOL 300 MG/ML  SOLN COMPARISON:  Radiographs  11/29/2014, thoracic MRI 09/21/2014 and chest CT 08/24/2014 FINDINGS: Mediastinum/Nodes: There are no enlarged mediastinal, hilar, internal mammary or axillary lymph nodes. There are postsurgical changes in the right axilla. There is a stable low-density right thyroid lesion measuring 1.8 cm on image 7. The heart size is normal. There is no pericardial effusion. There are no significant vascular findings. Lungs/Pleura: There is no pleural effusion. Previously noted right middle lobe nodule has decreased in size, measuring 10 mm maximally on image 29. There is surrounding atelectasis as well as subpleural scarring anteriorly in the right lung. A tiny right upper lobe nodule on image 23 is stable. No new or enlarging nodules are seen. Upper abdomen:  Unremarkable.  There is no adrenal mass. Musculoskeletal/Chest wall: Patient has undergone interval resection of the tumor at T3 with upper thoracic laminectomy and cervical thoracic fusion. Residual lucent lesion in the T3 vertebral body is likely postsurgical. There is no definite residual enhancing tumor. No new osseous lesions are identified. There are chronic compression deformities at T6, T10 and L1. There is a new mild superior endplate compression deformity at T5 which appears benign. Prominent left lateral meningocele at T2-3 appears unchanged. Previously noted mass medially in the left breast is no longer seen. There is a biopsy clip in this area. IMPRESSION: 1. Interval resection of tumor at T3 with laminectomy and fusion. No complication or definite residual tumor identified. 2. No new osseous metastases or epidural tumor visualized. 3. Interval development of benign appearing  superior endplate compression fracture at T5. The other chronic fractures are unchanged. 4. Interval improvement in right middle lobe nodule, likely treated metastatic disease. No new or enlarging nodules. 5. No adenopathy. Electronically Signed   By: Richardean Sale M.D.   On:  07/13/2015 12:20   ASSESSMENT: 75 y.o. [place] woman with stage IV left breast cancer involving bone  (1) status post right lumpectomy and axillary node dissection in 1985 followed by radiation at [facility] 08/16/2014: measurable disease in spine, lung and left breast   (2) evaluation for left shoulder pain led to thoracic spine MRI 08/16/2014 showing a pathologic fracture at T3 with epidural tumor displacing the cord to the right, but no cord compression. CT scans of the chest, abdomen and pelvis 08/24/2014 showed in addition a mass in the upper left breast measuring 1.6 cm and a nodule in the minor fissure of the right lung measuring 1.2 cm, but no parenchymal lung or liver lesions. CA 27-29 was noninformative at 38 (09/20/2014)    (3) mammography and ultrasonography 08/29/2014 show a mass in the upper inner left breast which was palpable,  measuring 2.0 cm by ultrasound. Biopsy of this mass 08/29/2014 showed an invasive breast cancer with both lobular and ductal features, estrogen receptor positive, progesterone receptor weakly positive, with an MIB-1 in the 40% range, HER-2 equivocal (6 else ratio 1.5, but average number her nucleus 5.8)   (4) letrozole started 08/31/2014;   (a) palbociclib added Sept 2016 at 75 mg 21/7, with significant neutropenia; not repeated after first cycle   (5)  zolendronate started 09/20/2014, rstopped after initial dose due to poor tolerance  (a) denosumab/ Xgeva started 01/31/2015, repeated every 4 weeks. Moved to every 8 weeks starting 07/17/15.    (6) on 10/20/2014 the patient underwent T2-T3 and T4 decompressive laminectomy with removal of epidural tumor, C7-T4 segmental pedicle screw instrumentation with virage screw system with arrow guidance protocol and C7-T4 posterolateral fusion. The cells were positive for the estrogen receptor. HER-2/neu testing by [facility] showed again equivocal results,  (7) radiation 11/23/2014-12/11/2014.  (a) T1-T5 was  treated to 30 Gy in 12 fractions at 2.5 Gy per fraction   (8)  consider eventual left lumpectomy or mastectomy depending on  longer-term results of systemic therapy  (9) genetics testing using the Breast/Ovarian Cancer Panel through [facility] [name], MD) found no mutations in ATM, BARD1, BRCA1, BRCA2, BRIP1, CDH1, CHEK2, EPCAM, FANCC, MLH1, MSH2, MSH6, NBN, PALB2, PMS2, PTEN, RAD51C, RAD51D, STK11, TP53, or XRCC2    PLAN: Rudolph and I reviewed the results of her chest CT. It showed showed interval improvement of the right lobe nodule with no new or enlarging nodules, as well as no residual tumor identified in the T3 area. This is consistent with the results of her most recent left breast ultrasound that showed a further decrease in the size of the known mass in this area. Given the great results, we will continue the letrozole daily until there is evidence of recurrent disease. Per Dr. [name] last note, we are going to move the denosumab out to every 8 weeks instead of monthly.   She was uninterested in PT for her right upper extremity edema. I did give her a pamphlet with the information for [facility] [employer] [facility] in case she would like a compression sleeve "over the counter."  Masiyah will return in 2 months for follow up with Dr. Jana Hakim along with her next dose of denosumab. She understands and agrees with this plan. She  knows the goal of treatment in her case is control. She has been encouraged to call with any issues that might arise before her next visit here.   Laurie Panda, NP   07/17/2015 3:54 PM

## 2015-07-17 NOTE — Telephone Encounter (Signed)
appts made and avs printed per 2/21 pof

## 2015-07-18 ENCOUNTER — Other Ambulatory Visit: Payer: Medicare Other

## 2015-07-18 ENCOUNTER — Ambulatory Visit: Payer: Medicare Other

## 2015-07-23 ENCOUNTER — Ambulatory Visit (INDEPENDENT_AMBULATORY_CARE_PROVIDER_SITE_OTHER): Payer: Medicare Other | Admitting: Physician Assistant

## 2015-07-23 VITALS — BP 118/76 | HR 106 | Temp 97.9°F | Resp 16 | Ht 62.0 in | Wt 123.8 lb

## 2015-07-23 DIAGNOSIS — R6889 Other general symptoms and signs: Secondary | ICD-10-CM | POA: Diagnosis not present

## 2015-07-23 DIAGNOSIS — Z0000 Encounter for general adult medical examination without abnormal findings: Secondary | ICD-10-CM

## 2015-07-23 DIAGNOSIS — E785 Hyperlipidemia, unspecified: Secondary | ICD-10-CM | POA: Diagnosis not present

## 2015-07-23 DIAGNOSIS — M810 Age-related osteoporosis without current pathological fracture: Secondary | ICD-10-CM

## 2015-07-23 DIAGNOSIS — K589 Irritable bowel syndrome without diarrhea: Secondary | ICD-10-CM

## 2015-07-23 DIAGNOSIS — R0989 Other specified symptoms and signs involving the circulatory and respiratory systems: Secondary | ICD-10-CM

## 2015-07-23 DIAGNOSIS — I10 Essential (primary) hypertension: Secondary | ICD-10-CM | POA: Diagnosis not present

## 2015-07-23 DIAGNOSIS — C7951 Secondary malignant neoplasm of bone: Secondary | ICD-10-CM

## 2015-07-23 DIAGNOSIS — Z0001 Encounter for general adult medical examination with abnormal findings: Secondary | ICD-10-CM | POA: Diagnosis not present

## 2015-07-23 DIAGNOSIS — E559 Vitamin D deficiency, unspecified: Secondary | ICD-10-CM | POA: Diagnosis not present

## 2015-07-23 DIAGNOSIS — R7303 Prediabetes: Secondary | ICD-10-CM

## 2015-07-23 DIAGNOSIS — J0100 Acute maxillary sinusitis, unspecified: Secondary | ICD-10-CM | POA: Diagnosis not present

## 2015-07-23 DIAGNOSIS — Z79899 Other long term (current) drug therapy: Secondary | ICD-10-CM

## 2015-07-23 DIAGNOSIS — C50912 Malignant neoplasm of unspecified site of left female breast: Secondary | ICD-10-CM

## 2015-07-23 MED ORDER — AZITHROMYCIN 250 MG PO TABS: ORAL_TABLET | ORAL | Status: AC

## 2015-07-23 MED ORDER — BENZONATATE 100 MG PO CAPS: 200.0000 mg | ORAL_CAPSULE | Freq: Three times a day (TID) | ORAL | Status: DC | PRN

## 2015-07-23 NOTE — Progress Notes (Signed)
MEDICARE ANNUAL WELLNESS VISIT AND FOLLOW UP  Assessment:   1. Labile hypertension - continue medications, DASH diet, exercise and monitor at home. Call if greater than 130/80.   2. Prediabetes Discussed general issues about diabetes pathophysiology and management., Educational material distributed., Suggested low cholesterol diet., Encouraged aerobic exercise., Discussed foot care., Reminded to get yearly retinal exam.  3. Hyperlipidemia -continue medications, check lipids, decrease fatty foods, increase activity.   4. IBS (irritable bowel syndrome) Diet controlled  5. 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 Metastasis to bone [facility]) Continue follow up  7. Osteoporosis Continue medications  8. Vitamin D deficiency Continue supplement  9. Medication management  10. Breast cancer metastasized to multiple sites, left [facility]) Continue follow up  11. Acute maxillary sinusitis, recurrence not specified With cough, had recent CT chest without nodules, no CP/SOB- has been x 7 weeks, will start on tessalon and zpak, call if not better.   Over 30 minutes of exam, counseling, chart review, and critical decision making was performed Future Appointments Date Time Provider [place]  09/11/2015 1:45 PM [facility] LAB 5 [facility] None  09/11/2015 2:00 PM Chauncey Cruel, MD [facility] None  09/11/2015 3:00 PM [facility] INJ NURSE [facility] None  09/19/2015 10:00 AM [name], MD [facility] None    Plan:   During the course of the visit the patient was educated and counseled about appropriate screening and preventive services including:    Pneumococcal vaccine   Influenza vaccine  Td vaccine  Prevnar 13  Screening electrocardiogram  Screening mammography  Bone densitometry screening  Colorectal cancer screening  Diabetes screening  Glaucoma screening  Nutrition counseling   Advanced  directives: given info/requested copies  Conditions/risks identified: Diabetes is at goal, ACE/ARB therapy: No, Reason not on Ace Inhibitor/ARB therapy:  preDM Urinary Incontinence is not an issue: discussed non pharmacology and pharmacology options.  Fall risk: low- discussed PT, home fall assessment, medications.    Subjective:   April Rosales is a 75 y.o. female who presents for Medicare Annual Wellness Visit and 3 month follow up on hypertension, prediabetes, hyperlipidemia, vitamin D def.  Date of last medicare wellness visit is unknown.   She has had a cough x last Tuesday. Worse with talking, worse last 2 mornings, has been on mucinex, non productive cough from last year. Denies sore throat, fever, chills, SOB, wheezing.  She has stage IV breast cancer, had lumpectomy in 1985 but most recently found to have left breast mass with invasive adenocarcimona on 08/29/2014, right lung nodules with recent CT chest 02/27 showing resolution of nodules, and pathological compression fractures of T3 with a tumor displacing epidural space. Following with Dr. Griffith Citron, on letrozole and on denosumab for osteoporosis.  Her blood pressure has been controlled at home, today their BP is BP: 118/76 mmHg She does not workout. She denies chest pain, shortness of breath, dizziness.  She is not on cholesterol medication and denies myalgias. Her cholesterol is at goal. The cholesterol last visit was:   Lab Results  Component Value Date   CHOL 175 03/14/2015   HDL 49 03/14/2015   LDLCALC 101 03/14/2015   TRIG 125 03/14/2015   CHOLHDL 3.6 03/14/2015   She has been working on diet and exercise for prediabetes, and denies paresthesia of the feet, polydipsia, polyuria and visual disturbances. Last A1C in the office was:  Lab Results  Component Value Date   HGBA1C 5.7* 03/14/2015  Patient is  on Vitamin D supplement. Lab Results  Component Value Date   VD25OH 52 03/14/2015      Medication  Review Current Outpatient Prescriptions on File Prior to Visit  Medication Sig Dispense Refill  . Ascorbic Acid (VITAMIN C PO) Take 1 tablet by mouth every other day. Takes qod    . aspirin EC 325 MG tablet Take 1 tablet (325 mg total) by mouth daily. 30 tablet 0  . calcium carbonate (TUMS) 500 MG chewable tablet Chew 2 tablets by mouth 2 (two) times daily.     . Cholecalciferol (VITAMIN D3 ADULT GUMMIES PO) Take 4,000 Units by mouth daily.    . Cyanocobalamin (VITAMIN B-12 PO) Take by mouth daily. Takes every other day    . Iron-Vitamins (GERITOL PO) Take by mouth daily.    Marland Kitchen letrozole (FEMARA) 2.5 MG tablet Take 1 tablet (2.5 mg total) by mouth daily. 90 tablet 12   No current facility-administered medications on file prior to visit.    Current Problems (verified) Patient Active Problem List   Diagnosis Date Noted  . BMI  21.95 03/14/2015  . Metastasis to bone ([place]) 10/20/2014  . Genetic testing 10/19/2014  . Family history of breast cancer   . Family history of ovarian cancer   . Breast cancer metastasized to multiple sites ([place]) 08/28/2014  . 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 Medication management 07/07/2013  . Labile hypertension   . Hyperlipidemia   . Prediabetes   . Vitamin D deficiency   . Osteoporosis   . IBS (irritable bowel syndrome)     Screening Tests Immunization History  Administered Date(s) Administered  . Influenza Split 02/07/2013  . Influenza, High Dose Seasonal PF 03/14/2015  . Td 01/05/2008    Preventative care: Last colonoscopy: Never Last mammogram: 08/2014 MRI breast 08/2014 Last pap smear/pelvic exam: s/p TAH DEXA: 2008 CT Chest 06/2015 CT AB 07/2014  Prior vaccinations: TD or Tdap: 2009  Influenza: 2016 Pneumococcal: declines Prevnar13: declines this visit will get next OV Shingles/Zostavax: declines  Names of Other Physician/Practitioners you currently use: 1. [facility]- here for primary care 2. NONE, eye doctor  3. unknown, dentist, last visit Oct 2016 4. Dr. Sherwood Gambler, neurosurgery Patient Care Team: [name], MD as PCP - General (Internal Medicine) Chauncey Cruel, MD as Consulting Physician (Oncology)  Past Surgical History  Procedure Laterality Date  . Appendectomy  1977  . Abdominal hysterectomy      1980  . Thyroid cyst excision  1967  . Breast surgery Right 1985  . Application of intraoperative ct scan N/A 10/20/2014    Procedure: APPLICATION OF INTRAOPERATIVE CAT SCAN;  Surgeon: [name], MD;  Location: [place] [facility];  Service: Neurosurgery;  Laterality: N/A;   Family History  Problem Relation Age of Onset  . Heart disease Mother   . Hypertension Mother   . Heart disease Father   . Diabetes Father   . Breast cancer Paternal Aunt     4 paternal aunts with breast cancer over 31  . Prostate cancer Paternal Uncle   . Stroke Paternal Grandfather   . Ovarian cancer Paternal Aunt   . Huntington's disease Other     Nephew, inherited from his father   Social History  Substance Use Topics  . Smoking status: Former Smoker -- 0.50 packs/day for 10 years    Types: Cigarettes  . Smokeless tobacco: Former Systems developer     Comment:  quit smoking 1970's  . Alcohol Use: 0.0 oz/week    0 Standard drinks or equivalent per week     Comment: Rare    MEDICARE WELLNESS OBJECTIVES: Tobacco use: She does not smoke.  Patient is a former smoker. If yes, counseling given Alcohol Current alcohol use: none Osteoporosis: postmenopausal estrogen deficiency, dietary calcium and/or vitamin D deficiency and amenorrhea, History of fracture in the past year: yes Diet: in general, a "healthy" diet   Physical activity: Current Exercise Habits:: The patient does not participate in regular exercise at present Cardiac risk factors: Cardiac Risk Factors include: advanced age (>62men, >89 women);hypertension;family history of  premature cardiovascular disease;sedentary lifestyle Depression/mood screen:   Depression screen [facility] 2/9 07/23/2015  Decreased Interest 0  Down, Depressed, Hopeless 0  PHQ - 2 Score 0    ADLs:  In your present state of health, do you have any difficulty performing the following activities: 07/23/2015 10/20/2014  Hearing? N N  Vision? N N  Difficulty concentrating or making decisions? N N  Walking or climbing stairs? Y N  Dressing or bathing? N N  Doing errands, shopping? N N  Preparing Food and eating ? N -  Using the Toilet? N -  In the past six months, have you accidently leaked urine? N -  Do you have problems with loss of bowel control? N -  Managing your Medications? N -  Managing your Finances? N -  Housekeeping or managing your Housekeeping? N -     Cognitive Testing  Alert? Yes  Normal Appearance?Yes  Oriented to person? Yes  Place? Yes   Time? Yes  Recall of three objects?  Yes  Can perform simple calculations? Yes  Displays appropriate judgment?Yes  Can read the correct time from a watch face?Yes  EOL planning: Does patient have an advance directive?: Yes Type of Advance Directive: [place] (The patient has named her sister Pleas Koch [phone]) and her friend Janina Mayo [phone]) as joint healthcare powers of attorney) Does patient want to make changes to advanced directive?: No - Patient declined Copy of advanced directive(s) in chart?: No - copy requested   Objective:   Today's Vitals   07/23/15 1623  BP: 118/76  Pulse: 106  Temp: 97.9 F (36.6 C)  Resp: 16  Height: 5\' 2"  (1.575 m)  Weight: 123 lb 12.8 oz (56.155 kg)  SpO2: 96%   Body mass index is 22.64 kg/(m^2).  General appearance: alert, no distress, WD/WN,  female HEENT: normocephalic, sclerae anicteric, TMs pearly, nares patent, no discharge or erythema, pharynx normal, no thrush, + maxillary tenderness to palpation Oral cavity: MMM, no lesions Neck: supple, no  lymphadenopathy, no thyromegaly, no masses Heart: RRR, normal S1, S2, no murmurs Lungs: CTA bilaterally, no wheezes, rhonchi, or rales Abdomen: +bs, soft, non tender, non distended, no masses, no hepatomegaly, no splenomegaly Musculoskeletal: cervical/throacic kyphosis, nontender, no swelling, no obvious deformity Extremities: no edema, no cyanosis, no clubbing Pulses: 2+ symmetric, upper and lower extremities, normal cap refill Neurological: alert, oriented x 3, CN2-12 intact, strength normal upper extremities and lower extremities, sensation normal throughout, DTRs 2+ throughout, no cerebellar signs, gait normal Psychiatric: normal affect, behavior normal, pleasant  Breast: defer Gyn: defer Rectal: defer   Medicare Attestation I have personally reviewed: The patient's medical and social history Their use of alcohol, tobacco or illicit drugs Their current medications and supplements The patient's functional ability including ADLs,fall risks, home safety risks, cognitive, and hearing and visual impairment Diet and physical activities  Evidence for depression or mood disorders  The patient's weight, height, BMI, and visual acuity have been recorded in the chart.  I have made referrals, counseling, and provided education to the patient based on review of the above and I have provided the patient with a written personalized care plan for preventive services.     Vicie Mutters, PA-C   07/23/2015

## 2015-07-23 NOTE — Patient Instructions (Signed)
Please pick one of the over the counter allergy medications below and take it once daily for allergies.  Claritin or loratadine cheapest but likely the weakest  Zyrtec or certizine at night because it can make you sleepy The strongest is allegra or fexafinadine  Cheapest at [employer], [employer], [employer]  Common causes of cough OR hoarseness OR sore throat:   Allergies, Viral Infections, Acid Reflux and Bacterial Infections.  1) Allergies and viral infections cause a cough OR sore throat by post nasal drip and are often worse at night, can also have sneezing, lower grade fevers, clear/yellow mucus. This is best treated with allergy medications or nasal sprays.  Please get on allegra for 1-2 weeks The strongest is allegra or fexafinadine  Cheapest at [employer], [employer], [employer]  2) Bacterial infections are more severe than allergies or viral infections with fever, teeth pain, fatigue. This can be treated with prednisone and the same over the counter medication and after 7 days can be treated with an antibiotic.   3) Silent reflux/GERD can cause a cough OR sore throat OR hoarseness WITHOUT heart burn because the esophagus that goes to the stomach and trachea that goes to the lungs are very close and when you lay down the acid can irritate your throat and lungs. This can cause hoarseness, cough, and wheezing. Please stop any alcohol or anti-inflammatories like aleve/advil/ibuprofen and start an over the counter Prilosec or omeprazole 1-2 times daily 79mins before food for 2 weeks, then switch to over the counter zantac/ratinidine or pepcid/famotadine once at night for 2 weeks.   4) sometimes irritation causes more irritation. Try voice rest, use sugar free cough drops to prevent coughing, and try to stop clearing your throat.   If you ever have a cough that does not go away after trying these things please make a follow up visit for further evaluation or we can refer you to a specialist. Or if you ever have  shortness of breath or chest pain go to the ER.

## 2015-07-27 ENCOUNTER — Encounter: Payer: Self-pay | Admitting: Internal Medicine

## 2015-08-15 ENCOUNTER — Other Ambulatory Visit: Payer: Medicare Other

## 2015-08-15 ENCOUNTER — Ambulatory Visit: Payer: Medicare Other

## 2015-08-20 DIAGNOSIS — M542 Cervicalgia: Secondary | ICD-10-CM | POA: Diagnosis not present

## 2015-09-10 DIAGNOSIS — I10 Essential (primary) hypertension: Secondary | ICD-10-CM | POA: Diagnosis not present

## 2015-09-10 DIAGNOSIS — M542 Cervicalgia: Secondary | ICD-10-CM | POA: Diagnosis not present

## 2015-09-11 ENCOUNTER — Other Ambulatory Visit (HOSPITAL_BASED_OUTPATIENT_CLINIC_OR_DEPARTMENT_OTHER): Payer: Medicare Other

## 2015-09-11 ENCOUNTER — Ambulatory Visit (HOSPITAL_BASED_OUTPATIENT_CLINIC_OR_DEPARTMENT_OTHER): Payer: Medicare Other

## 2015-09-11 ENCOUNTER — Telehealth: Payer: Self-pay | Admitting: Oncology

## 2015-09-11 ENCOUNTER — Ambulatory Visit (HOSPITAL_BASED_OUTPATIENT_CLINIC_OR_DEPARTMENT_OTHER): Payer: Medicare Other | Admitting: Oncology

## 2015-09-11 VITALS — BP 128/63 | HR 93 | Temp 97.4°F | Resp 18 | Ht 62.0 in | Wt 121.5 lb

## 2015-09-11 DIAGNOSIS — C50912 Malignant neoplasm of unspecified site of left female breast: Secondary | ICD-10-CM

## 2015-09-11 DIAGNOSIS — C50412 Malignant neoplasm of upper-outer quadrant of left female breast: Secondary | ICD-10-CM | POA: Insufficient documentation

## 2015-09-11 DIAGNOSIS — Z170 Estrogen receptor positive status [ER+]: Secondary | ICD-10-CM

## 2015-09-11 DIAGNOSIS — M25511 Pain in right shoulder: Secondary | ICD-10-CM

## 2015-09-11 DIAGNOSIS — C50919 Malignant neoplasm of unspecified site of unspecified female breast: Secondary | ICD-10-CM

## 2015-09-11 DIAGNOSIS — C7951 Secondary malignant neoplasm of bone: Secondary | ICD-10-CM

## 2015-09-11 DIAGNOSIS — M810 Age-related osteoporosis without current pathological fracture: Secondary | ICD-10-CM

## 2015-09-11 LAB — COMPREHENSIVE METABOLIC PANEL
ALBUMIN: 3.7 g/dL (ref 3.5–5.0)
ALK PHOS: 39 U/L — AB (ref 40–150)
ALT: 11 U/L (ref 0–55)
AST: 19 U/L (ref 5–34)
Anion Gap: 7 mEq/L (ref 3–11)
BUN: 15.8 mg/dL (ref 7.0–26.0)
CO2: 30 meq/L — AB (ref 22–29)
Calcium: 10.4 mg/dL (ref 8.4–10.4)
Chloride: 104 mEq/L (ref 98–109)
Creatinine: 0.8 mg/dL (ref 0.6–1.1)
EGFR: 76 mL/min/{1.73_m2} — AB (ref >90)
GLUCOSE: 95 mg/dL (ref 70–140)
POTASSIUM: 4.8 meq/L (ref 3.5–5.1)
SODIUM: 142 meq/L (ref 136–145)
TOTAL PROTEIN: 7 g/dL (ref 6.4–8.3)
Total Bilirubin: 0.36 mg/dL (ref 0.20–1.20)

## 2015-09-11 MED ORDER — LETROZOLE 2.5 MG PO TABS
2.5000 mg | ORAL_TABLET | Freq: Every day | ORAL | Status: DC
Start: 2015-09-11 — End: 2016-02-05

## 2015-09-11 MED ORDER — DENOSUMAB 120 MG/1.7ML ~~LOC~~ SOLN
120.0000 mg | Freq: Once | SUBCUTANEOUS | Status: AC
  Administered 2015-09-11: 120 mg via SUBCUTANEOUS
  Filled 2015-09-11: qty 1.7

## 2015-09-11 NOTE — Progress Notes (Signed)
[place]  [phone] [phone]   ID: [name] DOB: [birth year]  MR#: [id number]  XTK#:[id number]  Patient Care Team: [name], MD as PCP - General (Internal Medicine) Chauncey Cruel, MD as Consulting Physician (Oncology) PCP: [name], MD GYN: SU: [name] M.D. OTHER MD:  Rodell Perna M.D., [name] MD, Stephannie Peters M.D.  CHIEF COMPLAINT: Stage IV breast cancer  CURRENT TREATMENT:  Letrozole; denosumab  BREAST CANCER HISTORY: From the original intake note:   April Rosales underwent right lumpectomy in 1985 at [facility] for what sounds like a stage I breast cancer. She tells me she had more than 30 lymph nodes removed from her right axilla and all of them were clear. She received  adjuvant radiation but no systemic treatment.  The patient had recently refused mammography with the last mammogram I can find dating back to August 2010.  More recently the patient presented with left scapular pain radiating down the left arm.  She was evaluated by Dr. [name], who obtained a chest x-ray showing a possible abnormality at T3. He then set up the patient for an MRI of the thoracic spine performed 08/16/2014.  This showed multiple compression fractures  (a similar picture had been noted on lumbar MRI 10/09/2011 ). However at T3 they noted tumor in the vertebral body extending into the left pedicle and into the lateral epidural space, displacing the cord to the right. There was no evidence of cord compression or cord signal abnormality. There were no other areas of tumor identified in the thoracic spine.         The patient was then referred to Dr. Hal Neer who on 08/24/2014 set April Rosales up for CT scans of the chest, abdomen and pelvis. There was a dense mass in the upper inner quadrant of the left breast measuring 1.6 cm. There was a 1.2 cm nodule in the minor fissure of the right lung and some evidence of right lung  fibrosis at the site of the prior radiation port.  There was also a thyroid mass measuring 2 cm. However there were no parenchymal lung or liver lesions. Incidental meningoceles were noted as well as sclerosis of the fifth and sixth ribs which were felt to be likely posttraumatic.   On 08/29/2014 the patient underwent bilateral diagnostic mammography with tomosynthesis and left breast ultrasonography.  There were postsurgical changes in the upper right breast. In the left breast there was an irregular mass measuring 2.3 cm in the upper inner quadrant. This was palpable. Ultrasound showed this to be hypoechoic and to measure 2.0 cm. There were adjacent areas of nodularity. There was no definite lymphadenopathy in the left axilla.  Biopsy of this breast mass 08/29/2014 showed an invasive adenocarcinoma with both ductal and lobular features (there was strong diffuse E-cadherin expression as well as areas with total absence of E-cadherin expression), with the preliminary prognostic profile showing strong estrogen positivity, very weak to near absent progesterone positivity, an MIB-1 of approximately 40%, and HER-2 equivocal  The patient's subsequent history is as detailed below.  INTERVAL HISTORY: April Rosales returns today for follow-up of her metastatic breast cancer. She is tolerating the letrozole with no side effects at all that she is aware she also does very well with the denosumab.   REVIEW OF SYSTEMS: April Rosales feels a little bit tired. She does all her housework but she doesn't do any other type of exercise. This is by choice. She just does not like to take  walks, much less go to the gym more anything similar. She is having no unusual headaches no visual changes no cough or phlegm production and no change in bowel or bladder habits. She does have discomfort in the right shoulder area particularly when she abduction the arm. She is taking Tylenol for this. A detailed review of systems today was otherwise  stable   PAST MEDICAL HISTORY: Past Medical History  Diagnosis Date  . Osteoporosis     takes Vit D  . Family history of ovarian cancer   . Arthritis   . Chronic back pain     stenosis/listhesis  . Radiation 11/23/14-12/11/14    30 Gy T1-T5   . Breast cancer ([place]) 1985/2016    takes [name] daily    PAST SURGICAL HISTORY: Past Surgical History  Procedure Laterality Date  . Appendectomy  1977  . Abdominal hysterectomy      1980  . Thyroid cyst excision  1967  . Breast surgery Right 1985  . Application of intraoperative ct scan N/A 10/20/2014    Procedure: APPLICATION OF INTRAOPERATIVE CAT SCAN;  Surgeon: [name], MD;  Location: [place] [facility];  Service: Neurosurgery;  Laterality: N/A;    FAMILY HISTORY Family History  Problem Relation Age of Onset  . Heart disease Mother   . Hypertension Mother   . Heart disease Father   . Diabetes Father   . Breast cancer Paternal Aunt     4 paternal aunts with breast cancer over 24  . Prostate cancer Paternal Uncle   . Stroke Paternal Grandfather   . Ovarian cancer Paternal Aunt   . Huntington's disease Other     Nephew, inherited from his father   The patient's father died from a heart attack at the age of 49. He had 9 sisters. 3 of those sisters had breast cancer, all in a menopausal setting. Another sister had ovarian cancer. One of the paternal uncles had cancer of the colon "and back".  The patient's mother died at the age of 75. She was found to have breast cancer shortly before dying , during her final hospitalization.  GYNECOLOGIC HISTORY:  No LMP recorded. Patient has had a hysterectomy.  Menarche age 62, first live birth age 37, the patient is GX P1. She underwent hysterectomy in 1980. She thinks the ovaries were removed, but the CT scan obtained 08/24/2014 showed a definite right ovary. The left ovary may have been removed. She did not take hormone replacement after the hysterectomy.  SOCIAL HISTORY:   [name] worked in  Psychiatric nurse. She is divorced, lives alone, with 2 cats. Her son April Rosales sharp lives in [place] where he works in Engineer, technical sales. He has 4 children of his own. The patient is a Psychologist, forensic.    ADVANCED DIRECTIVES:  In place. The patient has named her sister April Rosales [phone]) and her friend April Rosales [phone]) as joint healthcare powers of attorney   HEALTH MAINTENANCE: Social History  Substance Use Topics  . Smoking status: Former Smoker -- 0.50 packs/day for 10 years    Types: Cigarettes  . Smokeless tobacco: Former Systems developer     Comment: quit smoking 1970's  . Alcohol Use: 0.0 oz/week    0 Standard drinks or equivalent per week     Comment: Rare     Colonoscopy: never  PAP: status post hysterectomy  Bone density: 08/14/2014 [facility], results pending  Lipid panel:  Allergies  Allergen Reactions  . Ativan [Lorazepam]     Made her crazy  .  Dilaudid [Hydromorphone Hcl]     Doesn't want  . Haldol [Haloperidol Lactate]     Made her crazy    Current Outpatient Prescriptions  Medication Sig Dispense Refill  . Ascorbic Acid (VITAMIN C PO) Take 1 tablet by mouth every other day. Takes qod    . aspirin EC 325 MG tablet Take 1 tablet (325 mg total) by mouth daily. 30 tablet 0  . benzonatate (TESSALON PERLES) 100 MG capsule Take 2 capsules (200 mg total) by mouth 3 (three) times daily as needed for cough (Max: 67m per day (6 capsules per day)). 60 capsule 0  . calcium carbonate (TUMS) 500 MG chewable tablet Chew 2 tablets by mouth 2 (two) times daily.     . Cholecalciferol (VITAMIN D3 ADULT GUMMIES PO) Take 4,000 Units by mouth daily.    . Cyanocobalamin (VITAMIN B-12 PO) Take by mouth daily. Takes every other day    . Iron-Vitamins (GERITOL PO) Take by mouth daily.    .Marland Kitchenletrozole (FEMARA) 2.5 MG tablet Take 1 tablet (2.5 mg total) by mouth daily. 90 tablet 12   No current facility-administered medications for this visit.    OBJECTIVE:  Older white woman In no acute  distress Filed Vitals:   09/11/15 1413  BP: 128/63  Pulse: 93  Temp: 97.4 F (36.3 C)  Resp: 18     Body mass index is 22.22 kg/(m^2).    ECOG FS:1 - Symptomatic but completely ambulatory  Sclerae unicteric, pupils round and equal Oropharynx clear and moist-- no thrush or other lesions No cervical or supraclavicular adenopathy Lungs no rales or rhonchi Heart regular rate and rhythm Abd soft, nontender, positive bowel sounds MSK kyphosis but no focal spinal tenderness, no upper extremity lymphedema Neuro: nonfocal, well oriented, appropriate affect Breasts: Deferred Skin: nodule at nape of neck measures approximatlely 3 cm, unable to photograph today  LAB RESULTS:  CMP     Component Value Date/Time   NA 143 07/17/2015 1442   NA 138 10/20/2014 1317   K 4.2 07/17/2015 1442   K 4.2 10/20/2014 1317   CL 103 10/13/2014 1427   CO2 28 07/17/2015 1442   CO2 29 10/13/2014 1427   GLUCOSE 108 07/17/2015 1442   GLUCOSE 104* 10/13/2014 1427   BUN 14.4 07/17/2015 1442   BUN 10 10/13/2014 1427   CREATININE 0.8 07/17/2015 1442   CREATININE 0.56 10/13/2014 1427   CREATININE 0.59 08/21/2014 1713   CALCIUM 10.6* 07/17/2015 1442   CALCIUM 10.5* 10/13/2014 1427   PROT 7.0 07/17/2015 1442   PROT 6.7 08/21/2014 1713   ALBUMIN 3.8 07/17/2015 1442   ALBUMIN 4.2 08/21/2014 1713   AST 20 07/17/2015 1442   AST 18 08/21/2014 1713   ALT 14 07/17/2015 1442   ALT 13 08/21/2014 1713   ALKPHOS 44 07/17/2015 1442   ALKPHOS 53 08/21/2014 1713   BILITOT 0.35 07/17/2015 1442   BILITOT 0.4 08/21/2014 1713   GFRNONAA >60 10/13/2014 1427   GFRNONAA >89 08/21/2014 1713   GFRAA >60 10/13/2014 1427   GFRAA >89 08/21/2014 1713    INo results found for: SPEP, UPEP  Lab Results  Component Value Date   WBC 6.0 07/17/2015   NEUTROABS 4.2 07/17/2015   HGB 14.8 07/17/2015   HCT 44.6 07/17/2015   MCV 92.0 07/17/2015   PLT 236 07/17/2015      Chemistry      Component Value Date/Time   NA 143  07/17/2015 1442   NA 138 10/20/2014 1317  K 4.2 07/17/2015 1442   K 4.2 10/20/2014 1317   CL 103 10/13/2014 1427   CO2 28 07/17/2015 1442   CO2 29 10/13/2014 1427   BUN 14.4 07/17/2015 1442   BUN 10 10/13/2014 1427   CREATININE 0.8 07/17/2015 1442   CREATININE 0.56 10/13/2014 1427   CREATININE 0.59 08/21/2014 1713      Component Value Date/Time   CALCIUM 10.6* 07/17/2015 1442   CALCIUM 10.5* 10/13/2014 1427   ALKPHOS 44 07/17/2015 1442   ALKPHOS 53 08/21/2014 1713   AST 20 07/17/2015 1442   AST 18 08/21/2014 1713   ALT 14 07/17/2015 1442   ALT 13 08/21/2014 1713   BILITOT 0.35 07/17/2015 1442   BILITOT 0.4 08/21/2014 1713       Lab Results  Component Value Date   LABCA2 38 09/20/2014    No components found for: [id number]  No results for input(s): INR in the last 168 hours.  Urinalysis No results found for: COLORURINE, APPEARANCEUR, LABSPEC, [place], GLUCOSEU, [place], BILIRUBINUR, KETONESUR, [place], [place], NITRITE, LEUKOCYTESUR  STUDIES:No results found.    ASSESSMENT: 75 y.o. April Rosales woman with stage IV left breast cancer involving bone  (1) status post right lumpectomy and axillary node dissection in 1985 followed by radiation at [place] 08/16/2014: measurable disease in spine, lung and left breast   (2) evaluation for left shoulder pain led to thoracic spine MRI 08/16/2014 showing a pathologic fracture at T3 with epidural tumor displacing the cord to the right, but no cord compression. CT scans of the chest, abdomen and pelvis 08/24/2014 showed in addition a mass in the upper left breast measuring 1.6 cm and a nodule in the minor fissure of the right lung measuring 1.2 cm, but no parenchymal lung or liver lesions. CA 27-29 was noninformative at 38 (09/20/2014)    (3) mammography and ultrasonography 08/29/2014 show a mass in the upper inner left breast which was palpable,  measuring 2.0 cm by ultrasound. Biopsy of this mass 08/29/2014  showed an invasive breast cancer with both lobular and ductal features, estrogen receptor positive, progesterone receptor weakly positive, with an MIB-1 in the 40% range, HER-2 equivocal (6 else ratio 1.5, but average number her nucleus 5.8)   (4) letrozole started 08/31/2014;   (a) palbociclib added Sept 2016 at 75 mg 21/7, with significant neutropenia; not repeated after first cycle   (5)  zolendronate started 09/20/2014, stopped after initial dose due to poor tolerance  (a) denosumab/ Xgeva started 01/31/2015, repeated every 4 weeks. Moved to every 8 weeks starting 07/17/15.    (6) on 10/20/2014 the patient underwent T2-T3 and T4 decompressive laminectomy with removal of epidural tumor, C7-T4 segmental pedicle screw instrumentation with virage screw system with arrow guidance protocol and C7-T4 posterolateral fusion. The cells were positive for the estrogen receptor. HER-2/neu testing by [facility] showed again equivocal results,  (7) radiation 11/23/2014-12/11/2014.  (a) T1-T5 was treated to 30 Gy in 12 fractions at 2.5 Gy per fraction   (8)  consider eventual left lumpectomy or mastectomy depending on  longer-term results of systemic therapy  (9) genetics testing using the Breast/Ovarian Cancer Panel through [facility] [name], MD) found no mutations in ATM, BARD1, BRCA1, BRCA2, BRIP1, CDH1, CHEK2, EPCAM, FANCC, MLH1, MSH2, MSH6, NBN, PALB2, PMS2, PTEN, RAD51C, RAD51D, STK11, TP53, or XRCC2    PLAN: April Rosales is having some right shoulder problems which I think may turn out to be bursitis. If so the Tylenol she is taking should take care of it. If  things started getting worse we will proceed to a CT scan of the chest.  She is tolerating the letrozole and denosumab well. Her calcium was a little bit high today and I suggested she not take extra calcium this evening as she usually does after her denosumab treatments. If her calcium is high again next time I will probably put the denosumab back to  monthly  She is going to see me again in 2 months but before that visit she will have repeat mammography [facility] We have measurable disease there and also at the nape of her neck.  Sometime in August we will probably obtain a bone scan.  At this point she is doing remarkably well considering that she has metastatic disease she will call with any problems that may develop before her next visit   Chauncey Cruel, MD   09/11/2015 2:42 PM

## 2015-09-11 NOTE — Telephone Encounter (Signed)
Gave  Patient avs report and appointments for June, including mammo/us at [facility] 6/6. Message to desk nurse to re-order breast US as limited and not complete [id number].

## 2015-09-19 ENCOUNTER — Encounter: Payer: Self-pay | Admitting: Internal Medicine

## 2015-09-19 ENCOUNTER — Ambulatory Visit (INDEPENDENT_AMBULATORY_CARE_PROVIDER_SITE_OTHER): Payer: Medicare Other | Admitting: Internal Medicine

## 2015-09-19 VITALS — BP 102/74 | HR 80 | Temp 97.7°F | Resp 16 | Ht 61.5 in | Wt 120.6 lb

## 2015-09-19 DIAGNOSIS — I10 Essential (primary) hypertension: Secondary | ICD-10-CM | POA: Diagnosis not present

## 2015-09-19 DIAGNOSIS — M810 Age-related osteoporosis without current pathological fracture: Secondary | ICD-10-CM

## 2015-09-19 DIAGNOSIS — R0989 Other specified symptoms and signs involving the circulatory and respiratory systems: Secondary | ICD-10-CM

## 2015-09-19 DIAGNOSIS — E785 Hyperlipidemia, unspecified: Secondary | ICD-10-CM | POA: Diagnosis not present

## 2015-09-19 DIAGNOSIS — R7309 Other abnormal glucose: Secondary | ICD-10-CM | POA: Diagnosis not present

## 2015-09-19 DIAGNOSIS — R7303 Prediabetes: Secondary | ICD-10-CM

## 2015-09-19 DIAGNOSIS — E559 Vitamin D deficiency, unspecified: Secondary | ICD-10-CM | POA: Diagnosis not present

## 2015-09-19 DIAGNOSIS — Z136 Encounter for screening for cardiovascular disorders: Secondary | ICD-10-CM

## 2015-09-19 DIAGNOSIS — Z79899 Other long term (current) drug therapy: Secondary | ICD-10-CM

## 2015-09-19 DIAGNOSIS — Z1212 Encounter for screening for malignant neoplasm of rectum: Secondary | ICD-10-CM

## 2015-09-19 LAB — HEMOGLOBIN A1C
HEMOGLOBIN A1C: 5.2 % (ref <5.7)
MEAN PLASMA GLUCOSE: 103 mg/dL

## 2015-09-19 LAB — CBC WITH DIFFERENTIAL/PLATELET
BASOS ABS: 34 {cells}/uL (ref 0–200)
Basophils Relative: 1 %
EOS ABS: 68 {cells}/uL (ref 15–500)
Eosinophils Relative: 2 %
HEMATOCRIT: 44.3 % (ref 35.0–45.0)
Hemoglobin: 14.9 g/dL (ref 11.7–15.5)
LYMPHS PCT: 29 %
Lymphs Abs: 986 cells/uL (ref 850–3900)
MCH: 31.4 pg (ref 27.0–33.0)
MCHC: 33.6 g/dL (ref 32.0–36.0)
MCV: 93.5 fL (ref 80.0–100.0)
MONOS PCT: 11 %
MPV: 9.1 fL (ref 7.5–12.5)
Monocytes Absolute: 374 cells/uL (ref 200–950)
NEUTROS PCT: 57 %
Neutro Abs: 1938 cells/uL (ref 1500–7800)
PLATELETS: 250 10*3/uL (ref 140–400)
RBC: 4.74 MIL/uL (ref 3.80–5.10)
RDW: 14.1 % (ref 11.0–15.0)
WBC: 3.4 10*3/uL — ABNORMAL LOW (ref 3.8–10.8)

## 2015-09-19 LAB — LIPID PANEL
Cholesterol: 197 mg/dL (ref 125–200)
HDL: 56 mg/dL (ref >=46)
LDL CALC: 119 mg/dL (ref <130)
Total CHOL/HDL Ratio: 3.5 Ratio (ref <=5.0)
Triglycerides: 109 mg/dL (ref <150)
VLDL: 22 mg/dL (ref <30)

## 2015-09-19 LAB — TSH: TSH: 1.42 mIU/L

## 2015-09-19 LAB — MAGNESIUM: Magnesium: 2.1 mg/dL (ref 1.5–2.5)

## 2015-09-19 NOTE — Progress Notes (Signed)
Patient ID: April Rosales, female   DOB: Nov 29, 1940, 75 y.o.   MRN: [id number]  Comprehensive Evaluation &  Examination  This very nice 75 y.o. DWF presents for a comprehensive evaluation and management of multiple medical co-morbidities.  Patient has been followed expectantly for HTN, Prediabetes, Hyperlipidemia and Vitamin D Deficiency. Other pertinant hx is R breast ca w/lumpectomy in 1984 treated with po radiation and then Tamoxifen. Then in Mar 20126 she was found to have a L Breast Ca metastatic with T3 involved and she underwent T3 decompression by Dr Hal Neer  later followed by T1-T5 radiation and she received Chemotherapy and now is on maintenance Letrozole by Dr Jana Hakim and is doing well.    Patient has remote hx/o labile HTN in 2009 and has been followed expectantly.  Patient's BP has been controlled at home and patient denies any cardiac symptoms as chest pain, palpitations, shortness of breath, dizziness or ankle swelling. Today's BP: 102/74 mmHg    Patient's hyperlipidemia is controlled with diet. Patient denies myalgias or other medication SE's. Last lipids were almost at goal with Cholesterol 175; HDL 49; LDL 101; Triglycerides 125 on 03/14/2015.   Patient is followed for Prediabetes with hx/o elevated insulin levels in the past and patient denies reactive hypoglycemic symptoms, visual blurring, diabetic polys or paresthesias. Last A1c was 5.7% on 03/14/2015.   Finally, patient has history of Vitamin D Deficiency of "28" in 2008 and last Vitamin D was  52 on 03/14/2015.  Medication Sig  . VITAMIN C  Take 1 tablet by mouth every other day. Takes qod  . aspirin EC 325 MG Take 1 tablet (325 mg total) by mouth daily.  . TUMS 500 MG Chew 2 tablets by mouth 2 (two) times daily.   Marland Kitchen VITAMIN D3 ADULT GUMMIES  Take 4,000 Units by mouth daily.  Marland Kitchen VITAMIN B-12 PO Takes every other day  . GERITOL Take by mouth daily.  Marland Kitchen letrozole (FEMARA) 2.5 MG tablet Take 1 tab daily.   Allergies   Allergen Reactions  . Ativan [Lorazepam]     Made her crazy  . Dilaudid [Hydromorphone Hcl]     Doesn't want  . Haldol [Haloperidol Lactate]     Made her crazy   Past Medical History  Diagnosis Date  . Osteoporosis     takes Vit D  . Family history of ovarian cancer   . Arthritis   . Chronic back pain     stenosis/listhesis  . Radiation 11/23/14-12/11/14    30 Gy T1-T5   . Breast cancer [facility]) 1985/2016    takes [facility] daily   Health Maintenance  Topic Date Due  . ZOSTAVAX  05/28/1999  . PNA vac Low Risk Adult (1 of 2 - PCV13) 05/27/2004  . INFLUENZA VACCINE  12/25/2015  . TETANUS/TDAP  01/04/2018  . DEXA SCAN  Completed   Immunization History  Administered Date(s) Administered  . Influenza Split 02/07/2013  . Influenza, High Dose Seasonal PF 03/14/2015  . Td 01/05/2008   Past Surgical History  Procedure Laterality Date  . Appendectomy  1977  . Abdominal hysterectomy      1980  . Thyroid cyst excision  1967  . Breast surgery Right 1985  . Application of intraoperative ct scan N/A 10/20/2014    Procedure: APPLICATION OF INTRAOPERATIVE CAT SCAN;  Surgeon: [name], MD;  Location: [place] [facility];  Service: Neurosurgery;  Laterality: N/A;   Family History  Problem Relation Age of Onset  . Heart disease Mother   .  Hypertension Mother   . Heart disease Father   . Diabetes Father   . Breast cancer Paternal Aunt     4 paternal aunts with breast cancer over 32  . Prostate cancer Paternal Uncle   . Stroke Paternal Grandfather   . Ovarian cancer Paternal Aunt   . Huntington's disease Other     Nephew, inherited from his father   Social History  Substance Use Topics  . Smoking status: Former Smoker -- 0.50 packs/day for 10 years    Types: Cigarettes  . Smokeless tobacco: Former Systems developer     Comment: quit smoking 1970's  . Alcohol Use: 0.0 oz/week    0 Standard drinks or equivalent per week     Comment: Rare    ROS Constitutional: Denies fever, chills, weight  loss/gain, headaches, insomnia,  night sweats, and change in appetite. Does c/o fatigue. Eyes: Denies redness, blurred vision, diplopia, discharge, itchy, watery eyes.  ENT: Denies discharge, congestion, post nasal drip, epistaxis, sore throat, earache, hearing loss, dental pain, Tinnitus, Vertigo, Sinus pain, snoring.  Cardio: Denies chest pain, palpitations, irregular heartbeat, syncope, dyspnea, diaphoresis, orthopnea, PND, claudication, edema Respiratory: denies cough, dyspnea, DOE, pleurisy, hoarseness, laryngitis, wheezing.  Gastrointestinal: Denies dysphagia, heartburn, reflux, water brash, pain, cramps, nausea, vomiting, bloating, diarrhea, constipation, hematemesis, melena, hematochezia, jaundice, hemorrhoids Genitourinary: Denies dysuria, frequency, urgency, nocturia, hesitancy, discharge, hematuria, flank pain Breast: Breast lumps, nipple discharge, bleeding.  Musculoskeletal: Denies arthralgia, myalgia, stiffness, Jt. Swelling, pain, limp, and strain/sprain. Denies falls. Skin: Denies puritis, rash, hives, warts, acne, eczema, changing in skin lesion Neuro: No weakness, tremor, incoordination, spasms, paresthesia, pain Psychiatric: Denies confusion, memory loss, sensory loss. Denies Depression. Endocrine: Denies change in weight, skin, hair change, nocturia, and paresthesia, diabetic polys, visual blurring, hyper / hypo glycemic episodes.  Heme/Lymph: No excessive bleeding, bruising, enlarged lymph nodes.  Physical Exam  BP 102/74 mmHg  Pulse 80  Temp(Src) 97.7 F (36.5 C)  Resp 16  Ht 5' 1.5" (1.562 m)  Wt 120 lb 9.6 oz (54.704 kg)  BMI 22.42 kg/m2  General Appearance: Well nourished and in no apparent distress. Eyes: PERRLA, EOMs, conjunctiva no swelling or erythema, normal fundi and vessels. Sinuses: No frontal/maxillary tenderness ENT/Mouth: EACs patent / TMs  nl. Nares clear without erythema, swelling, mucoid exudates. Oral hygiene is good. No erythema, swelling, or  exudate. Tongue normal, non-obstructing. Tonsils not swollen or erythematous. Hearing normal.  Neck: Supple, thyroid normal. No bruits, nodes or JVD. Respiratory: Respiratory effort normal.  BS equal and clear bilateral without rales, rhonci, wheezing or stridor. Cardio: Heart sounds are normal with regular rate and rhythm and no murmurs, rubs or gallops. Peripheral pulses are normal and equal bilaterally without edema. No aortic or femoral bruits. Chest: symmetric with normal excursions and percussion. Breasts: R breast with implant.  Abdomen: Flat, soft, with bowel sounds. Nontender, no guarding, rebound, hernias, masses, or organomegaly.  Lymphatics: Non tender without lymphadenopathy.  Genitourinary:  Musculoskeletal: Full ROM all peripheral extremities, joint stability, 5/5 strength, and normal gait. Skin: Warm and dry without rashes, lesions, cyanosis, clubbing or  ecchymosis.  Neuro: Cranial nerves intact, reflexes equal bilaterally. Normal muscle tone, no cerebellar symptoms. Sensation intact.  Pysch: Alert and oriented X 3, normal affect, Insight and Judgment appropriate.   Assessment and Plan  1. Labile hypertension  - Microalbumin / creatinine urine ratio - EKG 12-Lead - [place], RETROPERITNL ABD,  LTD - TSH  2. Hyperlipidemia  - Lipid panel - TSH  3. Prediabetes  - Hemoglobin A1c -  Insulin, random  4. Vitamin D deficiency  - VITAMIN D 25 Hydroxy   5. Osteoporosis   6. Screening for rectal cancer  - POC Hemoccult Bld/Stl   7. Medication management  - Urinalysis, Routine w reflex microscopic - CBC with Differential/Platelet - Magnesium   Continue prudent diet as discussed, weight control, BP monitoring, regular exercise, and medications. Discussed med's effects and SE's. Screening labs and tests as requested with regular follow-up as recommended. Over 40 minutes of exam, counseling, chart review and high complex critical decision making was performed.

## 2015-09-19 NOTE — Patient Instructions (Signed)

## 2015-09-20 LAB — MICROALBUMIN / CREATININE URINE RATIO
CREATININE, URINE: 48 mg/dL (ref 20–320)
MICROALB UR: 0.5 mg/dL
Microalb Creat Ratio: 10 mcg/mg creat (ref <30)

## 2015-09-20 LAB — URINALYSIS, ROUTINE W REFLEX MICROSCOPIC
BILIRUBIN URINE: NEGATIVE
GLUCOSE, UA: NEGATIVE
HGB URINE DIPSTICK: NEGATIVE
KETONES UR: NEGATIVE
Leukocytes, UA: NEGATIVE
NITRITE: NEGATIVE
PH: 7.5 (ref 5.0–8.0)
Protein, ur: NEGATIVE
Specific Gravity, Urine: 1.015 (ref 1.001–1.035)

## 2015-09-20 LAB — VITAMIN D 25 HYDROXY (VIT D DEFICIENCY, FRACTURES): VIT D 25 HYDROXY: 66 ng/mL (ref 30–100)

## 2015-09-20 LAB — INSULIN, RANDOM: INSULIN: 33.1 u[IU]/mL — AB (ref 2.0–19.6)

## 2015-10-30 ENCOUNTER — Ambulatory Visit
Admission: RE | Admit: 2015-10-30 | Discharge: 2015-10-30 | Disposition: A | Discharge status: Home / Self Care | Payer: Medicare Other | Source: Ambulatory Visit | Attending: Oncology | Admitting: Oncology

## 2015-10-30 ENCOUNTER — Other Ambulatory Visit: Payer: Self-pay | Admitting: Oncology

## 2015-10-30 DIAGNOSIS — C50412 Malignant neoplasm of upper-outer quadrant of left female breast: Secondary | ICD-10-CM

## 2015-10-30 DIAGNOSIS — R922 Inconclusive mammogram: Secondary | ICD-10-CM | POA: Diagnosis not present

## 2015-10-30 DIAGNOSIS — C50912 Malignant neoplasm of unspecified site of left female breast: Secondary | ICD-10-CM

## 2015-10-30 DIAGNOSIS — C7951 Secondary malignant neoplasm of bone: Secondary | ICD-10-CM

## 2015-10-30 DIAGNOSIS — N6489 Other specified disorders of breast: Secondary | ICD-10-CM | POA: Diagnosis not present

## 2015-11-06 ENCOUNTER — Ambulatory Visit (HOSPITAL_BASED_OUTPATIENT_CLINIC_OR_DEPARTMENT_OTHER): Payer: Medicare Other

## 2015-11-06 ENCOUNTER — Other Ambulatory Visit (HOSPITAL_BASED_OUTPATIENT_CLINIC_OR_DEPARTMENT_OTHER): Payer: Medicare Other

## 2015-11-06 ENCOUNTER — Telehealth: Payer: Self-pay | Admitting: Oncology

## 2015-11-06 ENCOUNTER — Ambulatory Visit (HOSPITAL_BASED_OUTPATIENT_CLINIC_OR_DEPARTMENT_OTHER): Payer: Medicare Other | Admitting: Oncology

## 2015-11-06 VITALS — BP 143/78 | HR 84 | Temp 98.0°F | Resp 18 | Ht 61.5 in | Wt 121.3 lb

## 2015-11-06 DIAGNOSIS — C7951 Secondary malignant neoplasm of bone: Secondary | ICD-10-CM

## 2015-11-06 DIAGNOSIS — C50912 Malignant neoplasm of unspecified site of left female breast: Secondary | ICD-10-CM

## 2015-11-06 DIAGNOSIS — C50412 Malignant neoplasm of upper-outer quadrant of left female breast: Secondary | ICD-10-CM | POA: Diagnosis not present

## 2015-11-06 DIAGNOSIS — M810 Age-related osteoporosis without current pathological fracture: Secondary | ICD-10-CM

## 2015-11-06 DIAGNOSIS — C50212 Malignant neoplasm of upper-inner quadrant of left female breast: Secondary | ICD-10-CM

## 2015-11-06 DIAGNOSIS — M545 Low back pain: Secondary | ICD-10-CM

## 2015-11-06 DIAGNOSIS — C50919 Malignant neoplasm of unspecified site of unspecified female breast: Secondary | ICD-10-CM

## 2015-11-06 LAB — CBC WITH DIFFERENTIAL/PLATELET
BASO%: 1.2 % (ref 0.0–2.0)
BASOS ABS: 0.1 10*3/uL (ref 0.0–0.1)
EOS%: 3.2 % (ref 0.0–7.0)
Eosinophils Absolute: 0.1 10*3/uL (ref 0.0–0.5)
HEMATOCRIT: 43.1 % (ref 34.8–46.6)
HEMOGLOBIN: 14.5 g/dL (ref 11.6–15.9)
LYMPH#: 1.2 10*3/uL (ref 0.9–3.3)
LYMPH%: 25.5 % (ref 14.0–49.7)
MCH: 30.6 pg (ref 25.1–34.0)
MCHC: 33.6 g/dL (ref 31.5–36.0)
MCV: 90.9 fL (ref 79.5–101.0)
MONO#: 0.4 10*3/uL (ref 0.1–0.9)
MONO%: 8.6 % (ref 0.0–14.0)
NEUT#: 2.8 10*3/uL (ref 1.5–6.5)
NEUT%: 61.5 % (ref 38.4–76.8)
PLATELETS: 217 10*3/uL (ref 145–400)
RBC: 4.74 10*6/uL (ref 3.70–5.45)
RDW: 13.7 % (ref 11.2–14.5)
WBC: 4.5 10*3/uL (ref 3.9–10.3)

## 2015-11-06 LAB — COMPREHENSIVE METABOLIC PANEL
ALBUMIN: 3.8 g/dL (ref 3.5–5.0)
ALK PHOS: 38 U/L — AB (ref 40–150)
ALT: 16 U/L (ref 0–55)
ANION GAP: 7 meq/L (ref 3–11)
AST: 20 U/L (ref 5–34)
BUN: 14.7 mg/dL (ref 7.0–26.0)
CALCIUM: 10.3 mg/dL (ref 8.4–10.4)
CHLORIDE: 106 meq/L (ref 98–109)
CO2: 27 mEq/L (ref 22–29)
CREATININE: 0.7 mg/dL (ref 0.6–1.1)
EGFR: 84 mL/min/{1.73_m2} — ABNORMAL LOW (ref >90)
Glucose: 80 mg/dl (ref 70–140)
Potassium: 4.3 mEq/L (ref 3.5–5.1)
Sodium: 140 mEq/L (ref 136–145)
Total Bilirubin: 0.38 mg/dL (ref 0.20–1.20)
Total Protein: 7.1 g/dL (ref 6.4–8.3)

## 2015-11-06 MED ORDER — DENOSUMAB 120 MG/1.7ML ~~LOC~~ SOLN
120.0000 mg | Freq: Once | SUBCUTANEOUS | Status: AC
  Administered 2015-11-06: 120 mg via SUBCUTANEOUS
  Filled 2015-11-06: qty 1.7

## 2015-11-06 NOTE — Telephone Encounter (Signed)
appt made and avs printed. CT to be scheduled by central radiology °

## 2015-11-06 NOTE — Progress Notes (Signed)
[place]  [phone] [phone]   ID: AMBER WILLIARD DOB: 1975-10-12  MR#: [id number]  BBC#:[id number]  Patient Care Team: [name], MD as PCP - General (Internal Medicine) Chauncey Cruel, MD as Consulting Physician (Oncology) PCP: [name], MD GYN: SU: [name] M.D. OTHER MD:  Rodell Perna M.D., [name] MD, Stephannie Peters M.D.  CHIEF COMPLAINT: Stage IV breast cancer  CURRENT TREATMENT:  Letrozole; denosumab  BREAST CANCER HISTORY: From the original intake note:   Jode underwent right lumpectomy in [facility] at [facility] for what sounds like a stage I breast cancer. She tells me she had more than 30 lymph nodes removed from her right axilla and all of them were clear. She received  adjuvant radiation but no systemic treatment.  The patient had recently refused mammography with the last mammogram I can find dating back to August 2010.  More recently the patient presented with left scapular pain radiating down the left arm.  She was evaluated by Dr. [name], who obtained a chest x-ray showing a possible abnormality at T3. He then set up the patient for an MRI of the thoracic spine performed 08/16/2014.  This showed multiple compression fractures  (a similar picture had been noted on lumbar MRI 10/09/2011 ). However at T3 they noted tumor in the vertebral body extending into the left pedicle and into the lateral epidural space, displacing the cord to the right. There was no evidence of cord compression or cord signal abnormality. There were no other areas of tumor identified in the thoracic spine.         The patient was then referred to Dr. Hal Neer who on 08/24/2014 set [name] up for CT scans of the chest, abdomen and pelvis. There was a dense mass in the upper inner quadrant of the left breast measuring 1.6 cm. There was a 1.2 cm nodule in the minor fissure of the right lung and some evidence of right lung  fibrosis at the site of the prior radiation port.  There was also a thyroid mass measuring 2 cm. However there were no parenchymal lung or liver lesions. Incidental meningoceles were noted as well as sclerosis of the fifth and sixth ribs which were felt to be likely posttraumatic.   On 08/29/2014 the patient underwent bilateral diagnostic mammography with tomosynthesis and left breast ultrasonography.  There were postsurgical changes in the upper right breast. In the left breast there was an irregular mass measuring 2.3 cm in the upper inner quadrant. This was palpable. Ultrasound showed this to be hypoechoic and to measure 2.0 cm. There were adjacent areas of nodularity. There was no definite lymphadenopathy in the left axilla.  Biopsy of this breast mass 08/29/2014 showed an invasive adenocarcinoma with both ductal and lobular features (there was strong diffuse E-cadherin expression as well as areas with total absence of E-cadherin expression), with the preliminary prognostic profile showing strong estrogen positivity, very weak to near absent progesterone positivity, an MIB-1 of approximately 40%, and HER-2 equivocal  The patient's subsequent history is as detailed below.  INTERVAL HISTORY: Matia returns today for follow-up (75-year-old). of her stage IV estrogen receptor positive breast cancer accompanied by her sister. Jakaylee is being treated with letrozole and denosumab. Currently she is receiving the denosumab every 2 months. She tolerates that with no side effects that she is aware of and particularly no symptoms of hypocalcemia. She has no bony aches from that treatment. She is also on Femara without any hot  flashes or vaginal dryness problems. She denies arthralgias or myalgias. She obtains the drug at a good price.  REVIEW OF SYSTEMS: Alzina does some house work and does a lot of reading. She denies unusual headaches, visual changes, nausea, vomiting falls or problems with balance. She denies any cough  phlegm production or pleurisy. There has been no change in bowel or bladder habits. She has some low back pain which is unchanged from baseline. She has numbness in her upper back where she had her surgery. A detailed review of systems was otherwise negative.  PAST MEDICAL HISTORY: Past Medical History  Diagnosis Date  . Osteoporosis     takes Vit D  . Family history of ovarian cancer   . Arthritis   . Chronic back pain     stenosis/listhesis  . Radiation 11/23/14-12/11/14    30 Gy T1-T5   . Breast cancer ([place]) 1985/2016    takes [name] daily    PAST SURGICAL HISTORY: Past Surgical History  Procedure Laterality Date  . Appendectomy  1977  . Abdominal hysterectomy      1980  . Thyroid cyst excision  1967  . Breast surgery Right 1985  . Application of intraoperative ct scan N/A 10/20/2014    Procedure: APPLICATION OF INTRAOPERATIVE CAT SCAN;  Surgeon: [name], MD;  Location: [facility];  Service: Neurosurgery;  Laterality: N/A;    FAMILY HISTORY Family History  Problem Relation Age of Onset  . Heart disease Mother   . Hypertension Mother   . Heart disease Father   . Diabetes Father   . Breast cancer Paternal Aunt     4 paternal aunts with breast cancer over 76  . Prostate cancer Paternal Uncle   . Stroke Paternal Grandfather   . Ovarian cancer Paternal Aunt   . Huntington's disease Other     Nephew, inherited from his father   The patient's father died from a heart attack at the age of 76. He had 9 sisters. 3 of those sisters had breast cancer, all in a menopausal setting. Another sister had ovarian cancer. One of the paternal uncles had cancer of the colon "and back".  The patient's mother died at the age of 68. She was found to have breast cancer shortly before dying , during her final hospitalization.  GYNECOLOGIC HISTORY:  No LMP recorded. Patient has had a hysterectomy.  Menarche age 41, first live birth age 67, the patient is GX P1. She underwent hysterectomy  in 1980. She thinks the ovaries were removed, but the CT scan obtained 08/24/2014 showed a definite right ovary. The left ovary may have been removed. She did not take hormone replacement after the hysterectomy.  SOCIAL HISTORY:   Khadeeja worked in Psychiatric nurse. She is divorced, lives alone, with 2 cats. Her son Arnette Norris sharp lives in [place] where he works in Engineer, technical sales. He has 4 children of his own. The patient is a Psychologist, forensic.    ADVANCED DIRECTIVES:  In place. The patient has named her sister Pleas Koch [phone]) and her friend Janina Mayo [phone]) as joint healthcare powers of attorney   HEALTH MAINTENANCE: Social History  Substance Use Topics  . Smoking status: Former Smoker -- 0.50 packs/day for 10 years    Types: Cigarettes  . Smokeless tobacco: Former Systems developer     Comment: quit smoking 1970's  . Alcohol Use: 0.0 oz/week    0 Standard drinks or equivalent per week     Comment: Rare     Colonoscopy: never  PAP: status post hysterectomy  Bone density: 08/14/2014 at [facility], results pending  Lipid panel:  Allergies  Allergen Reactions  . Ativan [Lorazepam]     Made her crazy  . Dilaudid [Hydromorphone Hcl]     Doesn't want  . Haldol [Haloperidol Lactate]     Made her crazy    Current Outpatient Prescriptions  Medication Sig Dispense Refill  . Ascorbic Acid (VITAMIN C PO) Take 1 tablet by mouth every other day. Takes qod    . aspirin EC 325 MG tablet Take 1 tablet (325 mg total) by mouth daily. 30 tablet 0  . calcium carbonate (TUMS) 500 MG chewable tablet Chew 2 tablets by mouth 2 (two) times daily.     . Cholecalciferol (VITAMIN D3 ADULT GUMMIES PO) Take 4,000 Units by mouth daily.    . Cyanocobalamin (VITAMIN B-12 PO) Take by mouth daily. Takes every other day    . Iron-Vitamins (GERITOL PO) Take by mouth daily.    Marland Kitchen letrozole (FEMARA) 2.5 MG tablet Take 1 tablet (2.5 mg total) by mouth daily. 90 tablet 12  . nabumetone (RELAFEN) 500 MG tablet TK 1 T PO  BID  3   No current facility-administered medications for this visit.    OBJECTIVE:  Older white womanWho appears stated age 75 Vitals:   11/06/15 1216  BP: 143/78  Pulse: 84  Temp: 98 F (36.7 C)  Resp: 18     Body mass index is 22.55 kg/(m^2).    ECOG FS:1 - Symptomatic but completely ambulatory  Sclerae unicteric, EOMs intact Oropharynx clear, dentition in good repair No cervical or supraclavicular adenopathy Lungs no rales or rhonchi Heart regular rate and rhythm Abd soft, nontender, positive bowel sounds MSK no focal spinal tenderness, no upper extremity lymphedema Neuro: nonfocal, well oriented, appropriate affect Breasts: I do not palpate a mass in either breast and specifically the mass in the left breast is none palpable  LAB RESULTS:  CMP     Component Value Date/Time   NA 142 09/11/2015 1400   NA 138 10/20/2014 1317   K 4.8 09/11/2015 1400   K 4.2 10/20/2014 1317   CL 103 10/13/2014 1427   CO2 30* 09/11/2015 1400   CO2 29 10/13/2014 1427   GLUCOSE 95 09/11/2015 1400   GLUCOSE 104* 10/13/2014 1427   BUN 15.8 09/11/2015 1400   BUN 10 10/13/2014 1427   CREATININE 0.8 09/11/2015 1400   CREATININE 0.56 10/13/2014 1427   CREATININE 0.59 08/21/2014 1713   CALCIUM 10.4 09/11/2015 1400   CALCIUM 10.5* 10/13/2014 1427   PROT 7.0 09/11/2015 1400   PROT 6.7 08/21/2014 1713   ALBUMIN 3.7 09/11/2015 1400   ALBUMIN 4.2 08/21/2014 1713   AST 19 09/11/2015 1400   AST 18 08/21/2014 1713   ALT 11 09/11/2015 1400   ALT 13 08/21/2014 1713   ALKPHOS 39* 09/11/2015 1400   ALKPHOS 53 08/21/2014 1713   BILITOT 0.36 09/11/2015 1400   BILITOT 0.4 08/21/2014 1713   GFRNONAA >60 10/13/2014 1427   GFRNONAA >89 08/21/2014 1713   GFRAA >60 10/13/2014 1427   GFRAA >89 08/21/2014 1713    INo results found for: SPEP, UPEP  Lab Results  Component Value Date   WBC 4.5 11/06/2015   NEUTROABS 2.8 11/06/2015   HGB 14.5 11/06/2015   HCT 43.1 11/06/2015   MCV 90.9  11/06/2015   PLT 217 11/06/2015      Chemistry      Component Value Date/Time   NA 142 09/11/2015  1400   NA 138 10/20/2014 1317   K 4.8 09/11/2015 1400   K 4.2 10/20/2014 1317   CL 103 10/13/2014 1427   CO2 30* 09/11/2015 1400   CO2 29 10/13/2014 1427   BUN 15.8 09/11/2015 1400   BUN 10 10/13/2014 1427   CREATININE 0.8 09/11/2015 1400   CREATININE 0.56 10/13/2014 1427   CREATININE 0.59 08/21/2014 1713      Component Value Date/Time   CALCIUM 10.4 09/11/2015 1400   CALCIUM 10.5* 10/13/2014 1427   ALKPHOS 39* 09/11/2015 1400   ALKPHOS 53 08/21/2014 1713   AST 19 09/11/2015 1400   AST 18 08/21/2014 1713   ALT 11 09/11/2015 1400   ALT 13 08/21/2014 1713   BILITOT 0.36 09/11/2015 1400   BILITOT 0.4 08/21/2014 1713       Lab Results  Component Value Date   LABCA2 38 09/20/2014    No components found for: [id number]  No results for input(s): INR in the last 168 hours.  Urinalysis    Component Value Date/Time   COLORURINE YELLOW 09/19/2015 1239   APPEARANCEUR CLOUDY* 09/19/2015 1239   LABSPEC 1.015 09/19/2015 1239   PHURINE 7.5 09/19/2015 1239   GLUCOSEU NEGATIVE 09/19/2015 1239   HGBUR NEGATIVE 09/19/2015 1239   BILIRUBINUR NEGATIVE 09/19/2015 1239   KETONESUR NEGATIVE 09/19/2015 1239   PROTEINUR NEGATIVE 09/19/2015 1239   NITRITE NEGATIVE 09/19/2015 1239   LEUKOCYTESUR NEGATIVE 09/19/2015 1239    [place]  10/30/2015  CLINICAL DATA:  75 year old with biopsy-proven invasive mammary carcinoma with both ductal and lobular features involving the upper outer quadrant of the left breast for which she is currently undergoing hormonal therapy with letrozole, without surgery. Prior spinal fusion surgery due to a metastasis involving the upper thoracic spine. Prior malignant lumpectomy of the upper outer quadrant of the right breast with adjuvant radiation therapy in 1985. Annual evaluation. EXAM: 2D DIGITAL DIAGNOSTIC BILATERAL MAMMOGRAM  WITH CAD AND ADJUNCT TOMO ULTRASOUND LEFT BREAST COMPARISON:  Mammography 08/29/2014, 01/02/2009. Left breast ultrasound 06/11/2015, 02/02/2015. ACR Breast Density Category c: The breast tissue is heterogeneously dense, which may obscure small masses. FINDINGS: Standard 2D and tomosynthesis full field CC and MLO views of both breasts were obtained. The biopsy proven malignancy in the upper outer left breast associated with the ribbon tissue marker clip has significantly decreased in size when compared to the mammogram from April, 2016. No new or suspicious findings elsewhere in the left breast. Post lumpectomy scarring involving the upper outer right breast, posterior depth. No findings suspicious for malignancy in the right breast. Mammographic images were processed with CAD. On physical exam, there is no palpable abnormality in the upper inner left breast. Targeted left breast ultrasound is performed, showing the biopsy proven malignancy at the 10 o'clock position approximately 5 cm from the nipple which measures approximately 7 x 3 x 5 mm (7 x 3 x 6 mm on 06/11/2015 and 14 x 7 x 10 mm on 02/02/2015). No new suspicious solid mass or abnormal acoustic shadowing is identified. IMPRESSION: 1. Interval significant decrease in size of the biopsy proven malignancy in the upper inner left breast since an ultrasound in September, 2016 and a prior mammogram in April, 2016. The mass has slightly decreased in size since the prior ultrasound from January, 2017. 2. No new or suspicious findings elsewhere in the left breast. 3. No mammographic evidence of malignancy, right breast. Expected post lumpectomy changes in the right breast. RECOMMENDATION: Left breast ultrasound in  6 months and bilateral diagnostic mammography in 1 year. I have discussed the findings and recommendations with the patient. Results were also provided in writing at the conclusion of the visit. If applicable, a reminder letter will be sent to the patient  regarding the next appointment. BI-RADS CATEGORY  6: Known biopsy-proven malignancy. Electronically Signed   By: Evangeline Dakin M.D.   On: 10/30/2015 15:17   Mm Diag Breast Tomo Bilateral  10/30/2015  CLINICAL DATA:  75 year old with biopsy-proven invasive mammary carcinoma with both ductal and lobular features involving the upper outer quadrant of the left breast for which she is currently undergoing hormonal therapy with letrozole, without surgery. Prior spinal fusion surgery due to a metastasis involving the upper thoracic spine. Prior malignant lumpectomy of the upper outer quadrant of the right breast with adjuvant radiation therapy in 1985. Annual evaluation. EXAM: 2D DIGITAL DIAGNOSTIC BILATERAL MAMMOGRAM WITH CAD AND ADJUNCT TOMO ULTRASOUND LEFT BREAST COMPARISON:  Mammography 08/29/2014, 01/02/2009. Left breast ultrasound 06/11/2015, 02/02/2015. ACR Breast Density Category c: The breast tissue is heterogeneously dense, which may obscure small masses. FINDINGS: Standard 2D and tomosynthesis full field CC and MLO views of both breasts were obtained. The biopsy proven malignancy in the upper outer left breast associated with the ribbon tissue marker clip has significantly decreased in size when compared to the mammogram from April, 2016. No new or suspicious findings elsewhere in the left breast. Post lumpectomy scarring involving the upper outer right breast, posterior depth. No findings suspicious for malignancy in the right breast. Mammographic images were processed with CAD. On physical exam, there is no palpable abnormality in the upper inner left breast. Targeted left breast ultrasound is performed, showing the biopsy proven malignancy at the 10 o'clock position approximately 5 cm from the nipple which measures approximately 7 x 3 x 5 mm (7 x 3 x 6 mm on 06/11/2015 and 14 x 7 x 10 mm on 02/02/2015). No new suspicious solid mass or abnormal acoustic shadowing is identified. IMPRESSION: 1. Interval  significant decrease in size of the biopsy proven malignancy in the upper inner left breast since an ultrasound in September, 2016 and a prior mammogram in April, 2016. The mass has slightly decreased in size since the prior ultrasound from January, 2017. 2. No new or suspicious findings elsewhere in the left breast. 3. No mammographic evidence of malignancy, right breast. Expected post lumpectomy changes in the right breast. RECOMMENDATION: Left breast ultrasound in 6 months and bilateral diagnostic mammography in 1 year. I have discussed the findings and recommendations with the patient. Results were also provided in writing at the conclusion of the visit. If applicable, a reminder letter will be sent to the patient regarding the next appointment. BI-RADS CATEGORY  6: Known biopsy-proven malignancy. Electronically Signed   By: Evangeline Dakin M.D.   On: 10/30/2015 15:17      ASSESSMENT: 75 y.o. [place] woman with stage IV left breast cancer involving bone  (1) status post right lumpectomy and axillary node dissection in 1985 followed by radiation at [facility] 08/16/2014: measurable disease in spine, lung and left breast   (2) evaluation for left shoulder pain led to thoracic spine MRI 08/16/2014 showing a pathologic fracture at T3 with epidural tumor displacing the cord to the right, but no cord compression. CT scans of the chest, abdomen and pelvis 08/24/2014 showed in addition a mass in the upper left breast measuring 1.6 cm and a nodule in the minor fissure of the right lung  measuring 1.2 cm, but no parenchymal lung or liver lesions. CA 27-29 was noninformative at 38 (09/20/2014)    (3) mammography and ultrasonography 08/29/2014 show a mass in the upper inner left breast which was palpable,  measuring 2.0 cm by ultrasound. Biopsy of this mass 08/29/2014 showed an invasive breast cancer with both lobular and ductal features, estrogen receptor positive, progesterone receptor weakly  positive, with an MIB-1 in the 40% range, HER-2 equivocal (6 else ratio 1.5, but average number her nucleus 5.8)   (4) letrozole started 08/31/2014;   (a) palbociclib added Sept 2016 at 75 mg 21/7, with significant neutropenia; not repeated after first cycle   (5)  zolendronate started 09/20/2014, stopped after initial dose due to poor tolerance  (a) denosumab/ Xgeva started 01/31/2015, repeated every 4 weeks. Moved to every 8 weeks starting 07/17/15.    (6) on 10/20/2014 the patient underwent T2-T3 and T4 decompressive laminectomy with removal of epidural tumor, C7-T4 segmental pedicle screw instrumentation with virage screw system with arrow guidance protocol and C7-T4 posterolateral fusion. The cells were positive for the estrogen receptor. HER-2/neu testing by [facility] showed again equivocal results,  (7) radiation 11/23/2014-12/11/2014.  (a) T1-T5 was treated to 30 Gy in 12 fractions at 2.5 Gy per fraction   (8)  consider eventual left lumpectomy or mastectomy depending on  longer-term results of systemic therapy  (9) genetics testing using the Breast/Ovarian Cancer Panel through [facility] [name], MD) found no mutations in ATM, BARD1, BRCA1, BRCA2, BRIP1, CDH1, CHEK2, EPCAM, FANCC, MLH1, MSH2, MSH6, NBN, PALB2, PMS2, PTEN, RAD51C, RAD51D, STK11, TP53, or XRCC2    PLAN: Adaliz's mammogram and ultrasound show a 50% reduction in her measurable disease. This is a very nice response particularly since she is tolerating the letrozole and the denosumab essentially with no side effects. Her functional status is unchanged from baseline. This is all very favorable.  Accordingly we are continuing treatment as before. She will have the denosumab every 2 months for the rest of this year and we will consider perhaps going to every 3 months next year. Unfortunately we do not have data for this comparable to the data available for his alendronate.  She will follow-up with me in September and we will obtain  a CT scan of the neck and chest prior to that visit. We will likely obtain a PET scan at the very end of the year.  I have encouraged her to be as active as she can. She knows to call with any problems that may develop before her next visit.     Chauncey Cruel, MD   11/06/2015 12:38 PM

## 2015-11-06 NOTE — Patient Instructions (Signed)
Denosumab injection What is this medicine? DENOSUMAB (den oh sue mab) slows bone breakdown. Prolia is used to treat osteoporosis in women after menopause and in men. Xgeva is used to prevent bone fractures and other bone problems caused by cancer bone metastases. Xgeva is also used to treat giant cell tumor of the bone. This medicine may be used for other purposes; ask your health care provider or pharmacist if you have questions. COMMON BRAND NAME(S): Prolia, XGEVA What should I tell my health care provider before I take this medicine? They need to know if you have any of these conditions: -dental disease -eczema -infection or history of infections -kidney disease or on dialysis -low blood calcium or vitamin D -malabsorption syndrome -scheduled to have surgery or tooth extraction -taking medicine that contains denosumab -thyroid or parathyroid disease -an unusual reaction to denosumab, other medicines, foods, dyes, or preservatives -pregnant or trying to get pregnant -breast-feeding How should I use this medicine? This medicine is for injection under the skin. It is given by a health care professional in a hospital or clinic setting. If you are getting Prolia, a special MedGuide will be given to you by the pharmacist with each prescription and refill. Be sure to read this information carefully each time. For Prolia, talk to your pediatrician regarding the use of this medicine in children. Special care may be needed. For Xgeva, talk to your pediatrician regarding the use of this medicine in children. While this drug may be prescribed for children as young as 13 years for selected conditions, precautions do apply. Overdosage: If you think you've taken too much of this medicine contact a poison control center or emergency room at once. Overdosage: If you think you have taken too much of this medicine contact a poison control center or emergency room at once. NOTE: This medicine is only for  you. Do not share this medicine with others. What if I miss a dose? It is important not to miss your dose. Call your doctor or health care professional if you are unable to keep an appointment. What may interact with this medicine? Do not take this medicine with any of the following medications: -other medicines containing denosumab This medicine may also interact with the following medications: -medicines that suppress the immune system -medicines that treat cancer -steroid medicines like prednisone or cortisone This list may not describe all possible interactions. Give your health care provider a list of all the medicines, herbs, non-prescription drugs, or dietary supplements you use. Also tell them if you smoke, drink alcohol, or use illegal drugs. Some items may interact with your medicine. What should I watch for while using this medicine? Visit your doctor or health care professional for regular checks on your progress. Your doctor or health care professional may order blood tests and other tests to see how you are doing. Call your doctor or health care professional if you get a cold or other infection while receiving this medicine. Do not treat yourself. This medicine may decrease your body's ability to fight infection. You should make sure you get enough calcium and vitamin D while you are taking this medicine, unless your doctor tells you not to. Discuss the foods you eat and the vitamins you take with your health care professional. See your dentist regularly. Brush and floss your teeth as directed. Before you have any dental work done, tell your dentist you are receiving this medicine. Do not become pregnant while taking this medicine or for 5 months after stopping   it. Women should inform their doctor if they wish to become pregnant or think they might be pregnant. There is a potential for serious side effects to an unborn child. Talk to your health care professional or pharmacist for more  information. What side effects may I notice from receiving this medicine? Side effects that you should report to your doctor or health care professional as soon as possible: -allergic reactions like skin rash, itching or hives, swelling of the face, lips, or tongue -breathing problems -chest pain -fast, irregular heartbeat -feeling faint or lightheaded, falls -fever, chills, or any other sign of infection -muscle spasms, tightening, or twitches -numbness or tingling -skin blisters or bumps, or is dry, peels, or red -slow healing or unexplained pain in the mouth or jaw -unusual bleeding or bruising Side effects that usually do not require medical attention (Report these to your doctor or health care professional if they continue or are bothersome.): -muscle pain -stomach upset, gas This list may not describe all possible side effects. Call your doctor for medical advice about side effects. You may report side effects to FDA at 1-800-FDA-1088. Where should I keep my medicine? This medicine is only given in a clinic, doctor's office, or other health care setting and will not be stored at home. NOTE: This sheet is a summary. It may not cover all possible information. If you have questions about this medicine, talk to your doctor, pharmacist, or health care provider.  2015, Elsevier/Gold Standard. (2011-11-10 12:37:47)  

## 2015-11-07 LAB — CANCER ANTIGEN 27.29: CA 27.29: 22.9 U/mL (ref 0.0–38.6)

## 2015-11-07 LAB — CANCER ANTIGEN 27-29 (PARALLEL TESTING): CA 27.29: 26 U/mL (ref <38)

## 2015-11-29 ENCOUNTER — Other Ambulatory Visit: Payer: Self-pay | Admitting: Oncology

## 2016-01-01 ENCOUNTER — Other Ambulatory Visit (HOSPITAL_BASED_OUTPATIENT_CLINIC_OR_DEPARTMENT_OTHER): Payer: Medicare Other

## 2016-01-01 ENCOUNTER — Ambulatory Visit (HOSPITAL_BASED_OUTPATIENT_CLINIC_OR_DEPARTMENT_OTHER): Payer: Medicare Other

## 2016-01-01 VITALS — BP 138/71 | HR 88 | Temp 98.2°F | Resp 18

## 2016-01-01 DIAGNOSIS — C50212 Malignant neoplasm of upper-inner quadrant of left female breast: Secondary | ICD-10-CM | POA: Diagnosis present

## 2016-01-01 DIAGNOSIS — C7951 Secondary malignant neoplasm of bone: Secondary | ICD-10-CM | POA: Diagnosis not present

## 2016-01-01 DIAGNOSIS — C50912 Malignant neoplasm of unspecified site of left female breast: Secondary | ICD-10-CM

## 2016-01-01 DIAGNOSIS — C50412 Malignant neoplasm of upper-outer quadrant of left female breast: Secondary | ICD-10-CM | POA: Diagnosis not present

## 2016-01-01 DIAGNOSIS — C50919 Malignant neoplasm of unspecified site of unspecified female breast: Secondary | ICD-10-CM

## 2016-01-01 DIAGNOSIS — M810 Age-related osteoporosis without current pathological fracture: Secondary | ICD-10-CM

## 2016-01-01 LAB — COMPREHENSIVE METABOLIC PANEL
ALT: 16 U/L (ref 0–55)
ANION GAP: 7 meq/L (ref 3–11)
AST: 20 U/L (ref 5–34)
Albumin: 3.7 g/dL (ref 3.5–5.0)
Alkaline Phosphatase: 44 U/L (ref 40–150)
BUN: 14 mg/dL (ref 7.0–26.0)
CHLORIDE: 106 meq/L (ref 98–109)
CO2: 28 meq/L (ref 22–29)
CREATININE: 0.7 mg/dL (ref 0.6–1.1)
Calcium: 10.9 mg/dL — ABNORMAL HIGH (ref 8.4–10.4)
EGFR: 81 mL/min/{1.73_m2} — ABNORMAL LOW (ref >90)
Glucose: 68 mg/dl — ABNORMAL LOW (ref 70–140)
POTASSIUM: 4.9 meq/L (ref 3.5–5.1)
Sodium: 142 mEq/L (ref 136–145)
Total Bilirubin: 0.34 mg/dL (ref 0.20–1.20)
Total Protein: 7.3 g/dL (ref 6.4–8.3)

## 2016-01-01 LAB — CBC WITH DIFFERENTIAL/PLATELET
BASO%: 1.1 % (ref 0.0–2.0)
Basophils Absolute: 0 10*3/uL (ref 0.0–0.1)
EOS%: 5.9 % (ref 0.0–7.0)
Eosinophils Absolute: 0.3 10*3/uL (ref 0.0–0.5)
HCT: 44.5 % (ref 34.8–46.6)
HGB: 14.8 g/dL (ref 11.6–15.9)
LYMPH%: 21.1 % (ref 14.0–49.7)
MCH: 30.3 pg (ref 25.1–34.0)
MCHC: 33.4 g/dL (ref 31.5–36.0)
MCV: 90.8 fL (ref 79.5–101.0)
MONO#: 0.5 10*3/uL (ref 0.1–0.9)
MONO%: 10.2 % (ref 0.0–14.0)
NEUT#: 2.7 10*3/uL (ref 1.5–6.5)
NEUT%: 61.7 % (ref 38.4–76.8)
Platelets: 262 10*3/uL (ref 145–400)
RBC: 4.9 10*6/uL (ref 3.70–5.45)
RDW: 13.6 % (ref 11.2–14.5)
WBC: 4.4 10*3/uL (ref 3.9–10.3)
lymph#: 0.9 10*3/uL (ref 0.9–3.3)

## 2016-01-01 MED ORDER — DENOSUMAB 120 MG/1.7ML ~~LOC~~ SOLN
120.0000 mg | Freq: Once | SUBCUTANEOUS | Status: AC
  Administered 2016-01-01: 120 mg via SUBCUTANEOUS
  Filled 2016-01-01: qty 1.7

## 2016-01-01 NOTE — Patient Instructions (Signed)
Denosumab injection What is this medicine? DENOSUMAB (den oh sue mab) slows bone breakdown. Prolia is used to treat osteoporosis in women after menopause and in men. Xgeva is used to prevent bone fractures and other bone problems caused by cancer bone metastases. Xgeva is also used to treat giant cell tumor of the bone. This medicine may be used for other purposes; ask your health care provider or pharmacist if you have questions. COMMON BRAND NAME(S): Prolia, XGEVA What should I tell my health care provider before I take this medicine? They need to know if you have any of these conditions: -dental disease -eczema -infection or history of infections -kidney disease or on dialysis -low blood calcium or vitamin D -malabsorption syndrome -scheduled to have surgery or tooth extraction -taking medicine that contains denosumab -thyroid or parathyroid disease -an unusual reaction to denosumab, other medicines, foods, dyes, or preservatives -pregnant or trying to get pregnant -breast-feeding How should I use this medicine? This medicine is for injection under the skin. It is given by a health care professional in a hospital or clinic setting. If you are getting Prolia, a special MedGuide will be given to you by the pharmacist with each prescription and refill. Be sure to read this information carefully each time. For Prolia, talk to your pediatrician regarding the use of this medicine in children. Special care may be needed. For Xgeva, talk to your pediatrician regarding the use of this medicine in children. While this drug may be prescribed for children as young as 13 years for selected conditions, precautions do apply. Overdosage: If you think you've taken too much of this medicine contact a poison control center or emergency room at once. Overdosage: If you think you have taken too much of this medicine contact a poison control center or emergency room at once. NOTE: This medicine is only for  you. Do not share this medicine with others. What if I miss a dose? It is important not to miss your dose. Call your doctor or health care professional if you are unable to keep an appointment. What may interact with this medicine? Do not take this medicine with any of the following medications: -other medicines containing denosumab This medicine may also interact with the following medications: -medicines that suppress the immune system -medicines that treat cancer -steroid medicines like prednisone or cortisone This list may not describe all possible interactions. Give your health care provider a list of all the medicines, herbs, non-prescription drugs, or dietary supplements you use. Also tell them if you smoke, drink alcohol, or use illegal drugs. Some items may interact with your medicine. What should I watch for while using this medicine? Visit your doctor or health care professional for regular checks on your progress. Your doctor or health care professional may order blood tests and other tests to see how you are doing. Call your doctor or health care professional if you get a cold or other infection while receiving this medicine. Do not treat yourself. This medicine may decrease your body's ability to fight infection. You should make sure you get enough calcium and vitamin D while you are taking this medicine, unless your doctor tells you not to. Discuss the foods you eat and the vitamins you take with your health care professional. See your dentist regularly. Brush and floss your teeth as directed. Before you have any dental work done, tell your dentist you are receiving this medicine. Do not become pregnant while taking this medicine or for 5 months after stopping   it. Women should inform their doctor if they wish to become pregnant or think they might be pregnant. There is a potential for serious side effects to an unborn child. Talk to your health care professional or pharmacist for more  information. What side effects may I notice from receiving this medicine? Side effects that you should report to your doctor or health care professional as soon as possible: -allergic reactions like skin rash, itching or hives, swelling of the face, lips, or tongue -breathing problems -chest pain -fast, irregular heartbeat -feeling faint or lightheaded, falls -fever, chills, or any other sign of infection -muscle spasms, tightening, or twitches -numbness or tingling -skin blisters or bumps, or is dry, peels, or red -slow healing or unexplained pain in the mouth or jaw -unusual bleeding or bruising Side effects that usually do not require medical attention (Report these to your doctor or health care professional if they continue or are bothersome.): -muscle pain -stomach upset, gas This list may not describe all possible side effects. Call your doctor for medical advice about side effects. You may report side effects to FDA at 1-800-FDA-1088. Where should I keep my medicine? This medicine is only given in a clinic, doctor's office, or other health care setting and will not be stored at home. NOTE: This sheet is a summary. It may not cover all possible information. If you have questions about this medicine, talk to your doctor, pharmacist, or health care provider.  2015, Elsevier/Gold Standard. (2011-11-10 12:37:47)  

## 2016-01-02 LAB — CANCER ANTIGEN 27-29 (PARALLEL TESTING): CA 27.29: 24 U/mL (ref <38)

## 2016-01-02 LAB — CANCER ANTIGEN 27.29: CA 27.29: 20.7 U/mL (ref 0.0–38.6)

## 2016-01-29 ENCOUNTER — Other Ambulatory Visit (HOSPITAL_BASED_OUTPATIENT_CLINIC_OR_DEPARTMENT_OTHER): Payer: Medicare Other

## 2016-01-29 ENCOUNTER — Ambulatory Visit: Payer: Medicare Other | Admitting: Oncology

## 2016-01-29 ENCOUNTER — Ambulatory Visit (HOSPITAL_COMMUNITY)
Admission: RE | Admit: 2016-01-29 | Discharge: 2016-01-29 | Disposition: A | Discharge status: Home / Self Care | Payer: Medicare Other | Source: Ambulatory Visit | Attending: Oncology | Admitting: Oncology

## 2016-01-29 ENCOUNTER — Encounter (HOSPITAL_COMMUNITY): Payer: Self-pay

## 2016-01-29 DIAGNOSIS — J479 Bronchiectasis, uncomplicated: Secondary | ICD-10-CM | POA: Diagnosis not present

## 2016-01-29 DIAGNOSIS — C50412 Malignant neoplasm of upper-outer quadrant of left female breast: Secondary | ICD-10-CM

## 2016-01-29 DIAGNOSIS — C50919 Malignant neoplasm of unspecified site of unspecified female breast: Secondary | ICD-10-CM | POA: Diagnosis not present

## 2016-01-29 DIAGNOSIS — C7951 Secondary malignant neoplasm of bone: Secondary | ICD-10-CM | POA: Insufficient documentation

## 2016-01-29 DIAGNOSIS — C50912 Malignant neoplasm of unspecified site of left female breast: Secondary | ICD-10-CM

## 2016-01-29 DIAGNOSIS — C50212 Malignant neoplasm of upper-inner quadrant of left female breast: Secondary | ICD-10-CM

## 2016-01-29 DIAGNOSIS — J984 Other disorders of lung: Secondary | ICD-10-CM | POA: Diagnosis not present

## 2016-01-29 DIAGNOSIS — E041 Nontoxic single thyroid nodule: Secondary | ICD-10-CM | POA: Insufficient documentation

## 2016-01-29 DIAGNOSIS — Z9889 Other specified postprocedural states: Secondary | ICD-10-CM | POA: Diagnosis not present

## 2016-01-29 DIAGNOSIS — M810 Age-related osteoporosis without current pathological fracture: Secondary | ICD-10-CM

## 2016-01-29 DIAGNOSIS — Z981 Arthrodesis status: Secondary | ICD-10-CM | POA: Diagnosis not present

## 2016-01-29 LAB — COMPREHENSIVE METABOLIC PANEL
ALBUMIN: 3.5 g/dL (ref 3.5–5.0)
ALK PHOS: 47 U/L (ref 40–150)
ALT: 17 U/L (ref 0–55)
AST: 21 U/L (ref 5–34)
Anion Gap: 9 mEq/L (ref 3–11)
BUN: 15 mg/dL (ref 7.0–26.0)
CO2: 29 meq/L (ref 22–29)
Calcium: 10.6 mg/dL — ABNORMAL HIGH (ref 8.4–10.4)
Chloride: 105 mEq/L (ref 98–109)
Creatinine: 0.7 mg/dL (ref 0.6–1.1)
EGFR: 84 mL/min/{1.73_m2} — AB (ref >90)
GLUCOSE: 75 mg/dL (ref 70–140)
POTASSIUM: 4.8 meq/L (ref 3.5–5.1)
SODIUM: 143 meq/L (ref 136–145)
Total Bilirubin: 0.3 mg/dL (ref 0.20–1.20)
Total Protein: 7.4 g/dL (ref 6.4–8.3)

## 2016-01-29 LAB — CBC WITH DIFFERENTIAL/PLATELET
BASO%: 1 % (ref 0.0–2.0)
BASOS ABS: 0.1 10*3/uL (ref 0.0–0.1)
EOS ABS: 0.3 10*3/uL (ref 0.0–0.5)
EOS%: 5 % (ref 0.0–7.0)
HCT: 42.7 % (ref 34.8–46.6)
HGB: 14.6 g/dL (ref 11.6–15.9)
LYMPH%: 31 % (ref 14.0–49.7)
MCH: 30.7 pg (ref 25.1–34.0)
MCHC: 34.2 g/dL (ref 31.5–36.0)
MCV: 89.9 fL (ref 79.5–101.0)
MONO#: 0.4 10*3/uL (ref 0.1–0.9)
MONO%: 8.2 % (ref 0.0–14.0)
NEUT#: 2.7 10*3/uL (ref 1.5–6.5)
NEUT%: 54.8 % (ref 38.4–76.8)
Platelets: 269 10*3/uL (ref 145–400)
RBC: 4.75 10*6/uL (ref 3.70–5.45)
RDW: 13.5 % (ref 11.2–14.5)
WBC: 5 10*3/uL (ref 3.9–10.3)
lymph#: 1.6 10*3/uL (ref 0.9–3.3)

## 2016-01-29 MED ORDER — IOPAMIDOL (ISOVUE-300) INJECTION 61%
75.0000 mL | Freq: Once | INTRAVENOUS | Status: AC | PRN
  Administered 2016-01-29: 75 mL via INTRAVENOUS

## 2016-01-30 LAB — CANCER ANTIGEN 27.29: CAN 27.29: 27.7 U/mL (ref 0.0–38.6)

## 2016-02-05 ENCOUNTER — Ambulatory Visit (HOSPITAL_BASED_OUTPATIENT_CLINIC_OR_DEPARTMENT_OTHER): Payer: Medicare Other | Admitting: Oncology

## 2016-02-05 VITALS — BP 153/86 | HR 91 | Temp 97.7°F | Resp 18 | Ht 61.5 in | Wt 122.2 lb

## 2016-02-05 DIAGNOSIS — C50412 Malignant neoplasm of upper-outer quadrant of left female breast: Secondary | ICD-10-CM | POA: Diagnosis not present

## 2016-02-05 DIAGNOSIS — E041 Nontoxic single thyroid nodule: Secondary | ICD-10-CM | POA: Diagnosis not present

## 2016-02-05 DIAGNOSIS — C7951 Secondary malignant neoplasm of bone: Secondary | ICD-10-CM | POA: Diagnosis not present

## 2016-02-05 DIAGNOSIS — C50912 Malignant neoplasm of unspecified site of left female breast: Secondary | ICD-10-CM

## 2016-02-05 MED ORDER — LETROZOLE 2.5 MG PO TABS: ORAL_TABLET | ORAL | 0 refills | Status: DC

## 2016-02-05 NOTE — Progress Notes (Signed)
April Rosales  [phone] [phone]   ID: April Rosales DOB: 11/21/1940  MR#: [id number]  EKC#:[id number]  Patient Care Team: [name], MD as PCP - General (Internal Medicine) Chauncey Cruel, MD as Consulting Physician (Oncology) PCP: [name], MD GYN: SU: [name] M.D. OTHER MD:  Rodell Perna M.D., [name] MD, Stephannie Peters M.D.  CHIEF COMPLAINT: Stage IV breast cancer  CURRENT TREATMENT:  Letrozole; denosumab/Xgeva  BREAST CANCER HISTORY: From the original intake note:   April Rosales underwent right lumpectomy in 1985 at [facility] for what sounds like a stage I breast cancer. She tells me she had more than 30 lymph nodes removed from her right axilla and all of them were clear. She received  adjuvant radiation but no systemic treatment.  The patient had recently refused mammography with the last mammogram I can find dating back to August 2010.  More recently the patient presented with left scapular pain radiating down the left arm.  She was evaluated by Dr. [name], who obtained a chest x-ray showing a possible abnormality at T3. He then set up the patient for an MRI of the thoracic spine performed 08/16/2014.  This showed multiple compression fractures  (a similar picture had been noted on lumbar MRI 10/09/2011 ). However at T3 they noted tumor in the vertebral body extending into the left pedicle and into the lateral epidural space, displacing the cord to the right. There was no evidence of cord compression or cord signal abnormality. There were no other areas of tumor identified in the thoracic spine.         The patient was then referred to Dr. Hal Neer who on 08/24/2014 set April Rosales up for CT scans of the chest, abdomen and pelvis. There was a dense mass in the upper inner quadrant of the left breast measuring 1.6 cm. There was a 1.2 cm nodule in the minor fissure of the right lung and some evidence of right  lung fibrosis at the site of the prior radiation port.  There was also a thyroid mass measuring 2 cm. However there were no parenchymal lung or liver lesions. Incidental meningoceles were noted as well as sclerosis of the fifth and sixth ribs which were felt to be likely posttraumatic.   On 08/29/2014 the patient underwent bilateral diagnostic mammography with tomosynthesis and left breast ultrasonography.  There were postsurgical changes in the upper right breast. In the left breast there was an irregular mass measuring 2.3 cm in the upper inner quadrant. This was palpable. Ultrasound showed this to be hypoechoic and to measure 2.0 cm. There were adjacent areas of nodularity. There was no definite lymphadenopathy in the left axilla.  Biopsy of this breast mass 08/29/2014 showed an invasive adenocarcinoma with both ductal and lobular features (there was strong diffuse E-cadherin expression as well as areas with total absence of E-cadherin expression), with the preliminary prognostic profile showing strong estrogen positivity, very weak to near absent progesterone positivity, an MIB-1 of approximately 40%, and HER-2 equivocal  The patient's subsequent history is as detailed below.  INTERVAL HISTORY: April Rosales returns today for follow-up of her estrogen receptor positive stage IV breast cancer accompanied by her sister. April Rosales is on daily letrozole, with good tolerance. Hot flashes and vaginal dryness are not a major issue. She never developed the arthralgias or myalgias that many patients can experience on this medication. She obtains it at a good price. She is also on Xgeva/denosumab every 28 days. She has had  no issues with those treatments so far.   Since her last visit here she had a CT scan of the chest and neck which shows no evidence of active disease. We will delighted with those results    REVIEW OF SYSTEMS: [name] sometimes can develop pain from the surgical site radiating to the right neck  or right shoulder. When she does when this happens is sitdown stale and it goes away. She has tried [employer] but they have not been very helpful. It happens perhaps once a week or so. She does not want any stronger medication including gabapentin for this, although this was discussed today. She is losing her hair little better and she thinks the Femara may be related to that. Otherwise a detailed review of systems today was stable  PAST MEDICAL HISTORY: Past Medical History:  Diagnosis Date  . Arthritis   . Breast cancer [facility]) 1985/2016   takes [name] daily  . Chronic back pain    stenosis/listhesis  . Family history of ovarian cancer   . Osteoporosis    takes Vit D  . Radiation 11/23/14-12/11/14   30 Gy T1-T5     PAST SURGICAL HISTORY: Past Surgical History:  Procedure Laterality Date  . ABDOMINAL HYSTERECTOMY     1980  . APPENDECTOMY  1977  . APPLICATION OF INTRAOPERATIVE CT SCAN N/A 10/20/2014   Procedure: APPLICATION OF INTRAOPERATIVE CAT SCAN;  Surgeon: [name], MD;  Location: [place] [facility];  Service: Neurosurgery;  Laterality: N/A;  . BREAST SURGERY Right 1985  . THYROID CYST EXCISION  1967    FAMILY HISTORY Family History  Problem Relation Age of Onset  . Heart disease Mother   . Hypertension Mother   . Heart disease Father   . Diabetes Father   . Breast cancer Paternal Aunt     4 paternal aunts with breast cancer over 34  . Prostate cancer Paternal Uncle   . Stroke Paternal Grandfather   . Ovarian cancer Paternal Aunt   . Huntington's disease Other     Nephew, inherited from his father   The patient's father died from a heart attack at the age of 89. He had 9 sisters. 3 of those sisters had breast cancer, all in a menopausal setting. Another sister had ovarian cancer. One of the paternal uncles had cancer of the colon "and back".  The patient's mother died at the age of 3. She was found to have breast cancer shortly before dying , during her final  hospitalization.  GYNECOLOGIC HISTORY:  No LMP recorded. Patient has had a hysterectomy.  Menarche age 29, first live birth age 72, the patient is GX P1. She underwent hysterectomy in 1980. She thinks the ovaries were removed, but the CT scan obtained 08/24/2014 showed a definite right ovary. The left ovary may have been removed. She did not take hormone replacement after the hysterectomy.  SOCIAL HISTORY:   Britnay worked in Psychiatric nurse. She is divorced, lives alone, with 2 cats. Her son April Rosales lives in [place] where he works in Engineer, technical sales. He has 4 children of his own. The patient is a Psychologist, forensic.    ADVANCED DIRECTIVES:  In place. The patient has named her sister April Rosales [phone]) and her friend April Rosales [phone]) as joint healthcare powers of attorney   HEALTH MAINTENANCE: Social History  Substance Use Topics  . Smoking status: Former Smoker    Packs/day: 0.50    Years: 10.00    Types: Cigarettes  . Smokeless tobacco:  Former Systems developer     Comment: quit smoking 1970's  . Alcohol use 0.0 oz/week     Comment: Rare     Colonoscopy: never  PAP: status post hysterectomy  Bone density: 08/14/2014 at [facility], results pending  Lipid panel:  Allergies  Allergen Reactions  . Ativan [Lorazepam]     Made her crazy  . Dilaudid [Hydromorphone Hcl]     Doesn't want  . Haldol [Haloperidol Lactate]     Made her crazy    Current Outpatient Prescriptions  Medication Sig Dispense Refill  . Ascorbic Acid (VITAMIN C PO) Take 1 tablet by mouth every other day. Takes qod    . aspirin EC 325 MG tablet Take 1 tablet (325 mg total) by mouth daily. 30 tablet 0  . calcium carbonate (TUMS) 500 MG chewable tablet Chew 2 tablets by mouth 2 (two) times daily.     . Cholecalciferol (VITAMIN D3 ADULT GUMMIES PO) Take 4,000 Units by mouth daily.    . Cyanocobalamin (VITAMIN B-12 PO) Take by mouth daily. Takes every other day    . Iron-Vitamins (GERITOL PO) Take by mouth daily.     Marland Kitchen letrozole (FEMARA) 2.5 MG tablet TAKE 1 TABLET(2.5 MG) BY MOUTH DAILY 90 tablet 0  . nabumetone (RELAFEN) 500 MG tablet TK 1 T PO BID  3   No current facility-administered medications for this visit.     OBJECTIVE:  Older white woman in no acute distress Vitals:   02/05/16 1046  BP: (!) 153/86  Pulse: 91  Resp: 18  Temp: 97.7 F (36.5 C)     Body mass index is 22.72 kg/m.    ECOG FS:1 - Symptomatic but completely ambulatory  Sclerae unicteric, pupils round and equal Oropharynx clear and moist-- no thrush or other lesions No cervical or supraclavicular adenopathy Lungs no rales or rhonchi Heart regular rate and rhythm Abd soft, nontender, positive bowel sounds MSK no focal spinal tenderness, no upper extremity lymphedema Neuro: nonfocal, well oriented, appropriate affect Breasts: The right breast is unremarkable. The left breast shows no palpable mass, no erythema, and the left axilla is benign.    LAB RESULTS:  CMP     Component Value Date/Time   NA 143 01/29/2016 1108   K 4.8 01/29/2016 1108   CL 103 10/13/2014 1427   CO2 29 01/29/2016 1108   GLUCOSE 75 01/29/2016 1108   BUN 15.0 01/29/2016 1108   CREATININE 0.7 01/29/2016 1108   CALCIUM 10.6 (H) 01/29/2016 1108   PROT 7.4 01/29/2016 1108   ALBUMIN 3.5 01/29/2016 1108   AST 21 01/29/2016 1108   ALT 17 01/29/2016 1108   ALKPHOS 47 01/29/2016 1108   BILITOT 0.30 01/29/2016 1108   GFRNONAA >60 10/13/2014 1427   GFRNONAA >89 08/21/2014 1713   GFRAA >60 10/13/2014 1427   GFRAA >89 08/21/2014 1713    INo results found for: SPEP, UPEP  Lab Results  Component Value Date   WBC 5.0 01/29/2016   NEUTROABS 2.7 01/29/2016   HGB 14.6 01/29/2016   HCT 42.7 01/29/2016   MCV 89.9 01/29/2016   PLT 269 01/29/2016      Chemistry      Component Value Date/Time   NA 143 01/29/2016 1108   K 4.8 01/29/2016 1108   CL 103 10/13/2014 1427   CO2 29 01/29/2016 1108   BUN 15.0 01/29/2016 1108   CREATININE 0.7  01/29/2016 1108      Component Value Date/Time   CALCIUM 10.6 (H) 01/29/2016 1108  ALKPHOS 47 01/29/2016 1108   AST 21 01/29/2016 1108   ALT 17 01/29/2016 1108   BILITOT 0.30 01/29/2016 1108       Lab Results  Component Value Date   LABCA2 24 01/01/2016    No components found for: [id number]  No results for input(s): INR in the last 168 hours.  Urinalysis    Component Value Date/Time   COLORURINE YELLOW 09/19/2015 1239   APPEARANCEUR CLOUDY (A) 09/19/2015 1239   LABSPEC 1.015 09/19/2015 1239   PHURINE 7.5 09/19/2015 1239   GLUCOSEU NEGATIVE 09/19/2015 1239   HGBUR NEGATIVE 09/19/2015 1239   BILIRUBINUR NEGATIVE 09/19/2015 1239   KETONESUR NEGATIVE 09/19/2015 1239   PROTEINUR NEGATIVE 09/19/2015 1239   NITRITE NEGATIVE 09/19/2015 1239   LEUKOCYTESUR NEGATIVE 09/19/2015 1239    STUDIES:Ct Soft Tissue Neck W Contrast  Result Date: 01/29/2016 CLINICAL DATA:  Metastatic breast cancer EXAM: CT NECK WITH CONTRAST TECHNIQUE: Multidetector CT imaging of the neck was performed using the standard protocol following the bolus administration of intravenous contrast. CONTRAST:  64m ISOVUE-300 IOPAMIDOL (ISOVUE-300) INJECTION 61% COMPARISON:  Thoracic MRI 09/21/2014 FINDINGS: Pharynx and larynx: Nasopharynx normal. Oral pharynx normal. Tongue normal. Epiglottis and larynx normal. Salivary glands: Parotid and submandibular glands normal bilaterally. Thyroid: 12 mm right thyroid low-density nodule most likely a complex cyst. Small adjacent calcification in the right lobe of the thyroid. Small calcification left lobe of the thyroid. Lymph nodes: Negative for cervical spine lymphadenopathy Vascular: Carotid artery patent bilaterally with atherosclerotic calcification. Jugular vein patent bilaterally. Limited intracranial: Negative Visualized orbits: Negative Mastoids and visualized paranasal sinuses: Negative Skeleton: Metastatic disease T3 as noted on the prior MRI. Posterior thoracic fusion with  hardware. No new fracture or metastatic disease in the cervical spine. Upper chest: Chest CT from today reported separately. IMPRESSION: Negative for metastatic disease in the neck.  No adenopathy or mass. Right thyroid nodule appears to represent a complex cyst. Metastatic disease T3 with posterior thoracic fixation. Electronically Signed   By: CFranchot GalloM.D.   On: 01/29/2016 14:12   Ct Chest W Contrast  Result Date: 01/29/2016 CLINICAL DATA:  Breast cancer 30 years ago with right lumpectomy. Left breast cancer 2016 with bone mets. History of T3 vertebral lesion. EXAM: CT CHEST WITH CONTRAST TECHNIQUE: Multidetector CT imaging of the chest was performed during intravenous contrast administration. CONTRAST:  737mISOVUE-300 IOPAMIDOL (ISOVUE-300) INJECTION 61% COMPARISON:  07/13/2015 FINDINGS: Cardiovascular: The heart size is normal. No pericardial effusion. Coronary artery calcification is noted. Atherosclerotic calcification is noted in the wall of the throracic aorta. Mediastinum/Nodes: No mediastinal lymphadenopathy. There is no hilar lymphadenopathy. The esophagus has normal imaging features. 15 mm right thyroid nodule evident. Laminar flow of opacified blood noted right internal jugular vein. Lack of visualization right innominate vein suggests central stenosis. There is no axillary lymphadenopathy. Lungs/Pleura: Stable appearance pleural-parenchymal scarring anterior right lung. 10 mm nodular component measured previously is stable at 10 mm today (image 78 series 7). Right upper lobe volume loss and bronchiectasis is stable with scattered areas of small airway impaction. Calcified pleural plaques noted bilaterally. No new or progressive pulmonary nodule or mass. No focal airspace consolidation. No pulmonary edema or pleural effusion. Upper Abdomen: Unremarkable. Musculoskeletal: Upper thoracic laminectomy and fusion again noted. 19 mm soft tissue nodule to the left of the to 3 level, positioned  between the medial second and third left ribs is unchanged and remains compatible with previously described meningocele. Demineralization at &3 compatible with osseous metastatic involvement with resection and  fusion as noted previously. Stable superior endplate compression deformity at T5. IMPRESSION: 1. Stable exam without new or progressive findings. 2. Pleural-parenchymal scarring anterior right lung. Stable small 10 mm nodular component. 3. Status post laminectomy and fusion for tumor at T3, stable. Electronically Signed   By: Misty Stanley M.D.   On: 01/29/2016 14:18      ASSESSMENT: 75 y.o. April Rosales woman with stage IV left breast cancer involving bone  (1) status post right lumpectomy and axillary node dissection in 1985 followed by radiation at [place] 08/16/2014: measurable disease in spine, lung and left breast   (2) evaluation for left shoulder pain led to thoracic spine MRI 08/16/2014 showing a pathologic fracture at T3 with epidural tumor displacing the cord to the right, but no cord compression. CT scans of the chest, abdomen and pelvis 08/24/2014 showed in addition a mass in the upper outer quadrant left breast measuring 1.6 cm and a nodule in the minor fissure of the right lung measuring 1.2 cm, but no parenchymal lung or liver lesions. CA 27-29 was noninformative at 38 (09/20/2014)    (3) mammography and ultrasonography 08/29/2014 show a mass in the upper inner left breast which was palpable,  measuring 2.0 cm by ultrasound. Biopsy of this mass 08/29/2014 showed an invasive breast cancer with both lobular and ductal features, estrogen receptor positive, progesterone receptor weakly positive, with an MIB-1 in the 40% range, HER-2 equivocal (6 else ratio 1.5, but average number her nucleus 5.8)   (4) letrozole started 08/31/2014;   (a) palbociclib added Sept 2016 at 75 mg 21/7, with significant neutropenia; not repeated after first cycle   (5)  zolendronate started  09/20/2014, stopped after initial dose due to poor tolerance  (a) denosumab/ Xgeva started 01/31/2015, repeated every 4 weeks. Moved to every 8 weeks starting 07/17/15.    (6) on 10/20/2014 the patient underwent T2-T3 and T4 decompressive laminectomy with removal of epidural tumor, C7-T4 segmental pedicle screw instrumentation with virage screw system with arrow guidance protocol and C7-T4 posterolateral fusion. The cells were positive for the estrogen receptor. HER-2/neu testing by [facility] showed again equivocal results,  (7) radiation 11/23/2014-12/11/2014.  (a) T1-T5 was treated to 30 Gy in 12 fractions at 2.5 Gy per fraction   (8)  consider eventual left lumpectomy or mastectomy depending on  longer-term results of systemic therapy  (9) genetics testing using the Breast/Ovarian Cancer Panel through [facility] [name], MD) found no mutations in ATM, BARD1, BRCA1, BRCA2, BRIP1, CDH1, CHEK2, EPCAM, FANCC, MLH1, MSH2, MSH6, NBN, PALB2, PMS2, PTEN, RAD51C, RAD51D, STK11, TP53, or XRCC2    (10) right thyroid nodule is a complex cyst as noted on CT scan of the neck 01/29/2016  PLAN: Louellen is tolerating her treatment remarkably well and by the recent CTs of the chest and neck there is no evidence of active disease  I think it would be prudent to return the denosumab/Xgeva to monthly. We discussed that at length today and she is agreeable to that.  I reassured her that her thyroid cyst requires only further follow-up. We do not need to proceed to any surgical intervention.  We discussed how to handle the pain that she occasionally experiences from her surgery. I suggested gabapentin for her but what she wants to do is simply deal with it as it occurs. If he becomes more constant she tells me she would be more agreeable to trying some medication.  Otherwise she will return to see me in January. We will  repeat plain films of the neck and a chest x-ray before that visit. We will completely restage her  sometime in April of May or next year. Of course any symptoms that may develop before then we would evaluate aggressively.    Chauncey Cruel, MD   02/05/2016 8:16 PM

## 2016-02-22 ENCOUNTER — Other Ambulatory Visit: Payer: Self-pay | Admitting: Oncology

## 2016-02-22 NOTE — Telephone Encounter (Signed)
Chart reviewed.

## 2016-02-26 ENCOUNTER — Ambulatory Visit (HOSPITAL_BASED_OUTPATIENT_CLINIC_OR_DEPARTMENT_OTHER): Payer: Medicare Other

## 2016-02-26 ENCOUNTER — Other Ambulatory Visit (HOSPITAL_BASED_OUTPATIENT_CLINIC_OR_DEPARTMENT_OTHER): Payer: Medicare Other

## 2016-02-26 VITALS — BP 160/60 | HR 91 | Temp 98.2°F | Resp 20

## 2016-02-26 DIAGNOSIS — M810 Age-related osteoporosis without current pathological fracture: Secondary | ICD-10-CM

## 2016-02-26 DIAGNOSIS — C7951 Secondary malignant neoplasm of bone: Secondary | ICD-10-CM

## 2016-02-26 DIAGNOSIS — C50912 Malignant neoplasm of unspecified site of left female breast: Secondary | ICD-10-CM | POA: Diagnosis not present

## 2016-02-26 DIAGNOSIS — C50919 Malignant neoplasm of unspecified site of unspecified female breast: Secondary | ICD-10-CM

## 2016-02-26 DIAGNOSIS — C50412 Malignant neoplasm of upper-outer quadrant of left female breast: Secondary | ICD-10-CM

## 2016-02-26 LAB — COMPREHENSIVE METABOLIC PANEL
ALT: 17 U/L (ref 0–55)
ANION GAP: 8 meq/L (ref 3–11)
AST: 20 U/L (ref 5–34)
Albumin: 3.7 g/dL (ref 3.5–5.0)
Alkaline Phosphatase: 42 U/L (ref 40–150)
BUN: 15.2 mg/dL (ref 7.0–26.0)
CHLORIDE: 106 meq/L (ref 98–109)
CO2: 27 meq/L (ref 22–29)
Calcium: 10.5 mg/dL — ABNORMAL HIGH (ref 8.4–10.4)
Creatinine: 0.7 mg/dL (ref 0.6–1.1)
EGFR: 84 mL/min/{1.73_m2} — AB (ref >90)
GLUCOSE: 69 mg/dL — AB (ref 70–140)
Potassium: 4.8 mEq/L (ref 3.5–5.1)
SODIUM: 141 meq/L (ref 136–145)
Total Bilirubin: 0.38 mg/dL (ref 0.20–1.20)
Total Protein: 7.2 g/dL (ref 6.4–8.3)

## 2016-02-26 LAB — CBC WITH DIFFERENTIAL/PLATELET
BASO%: 1.1 % (ref 0.0–2.0)
BASOS ABS: 0 10*3/uL (ref 0.0–0.1)
EOS%: 4.6 % (ref 0.0–7.0)
Eosinophils Absolute: 0.2 10*3/uL (ref 0.0–0.5)
HCT: 44.3 % (ref 34.8–46.6)
HEMOGLOBIN: 14.7 g/dL (ref 11.6–15.9)
LYMPH%: 26.3 % (ref 14.0–49.7)
MCH: 30.4 pg (ref 25.1–34.0)
MCHC: 33.2 g/dL (ref 31.5–36.0)
MCV: 91.5 fL (ref 79.5–101.0)
MONO#: 0.4 10*3/uL (ref 0.1–0.9)
MONO%: 10.6 % (ref 0.0–14.0)
NEUT#: 2.3 10*3/uL (ref 1.5–6.5)
NEUT%: 57.4 % (ref 38.4–76.8)
Platelets: 253 10*3/uL (ref 145–400)
RBC: 4.85 10*6/uL (ref 3.70–5.45)
RDW: 14 % (ref 11.2–14.5)
WBC: 4.1 10*3/uL (ref 3.9–10.3)
lymph#: 1.1 10*3/uL (ref 0.9–3.3)

## 2016-02-26 MED ORDER — DENOSUMAB 120 MG/1.7ML ~~LOC~~ SOLN
120.0000 mg | Freq: Once | SUBCUTANEOUS | Status: AC
  Administered 2016-02-26: 120 mg via SUBCUTANEOUS
  Filled 2016-02-26: qty 1.7

## 2016-02-26 NOTE — Patient Instructions (Signed)
Denosumab injection What is this medicine? DENOSUMAB (den oh sue mab) slows bone breakdown. Prolia is used to treat osteoporosis in women after menopause and in men. Xgeva is used to prevent bone fractures and other bone problems caused by cancer bone metastases. Xgeva is also used to treat giant cell tumor of the bone. This medicine may be used for other purposes; ask your health care provider or pharmacist if you have questions. COMMON BRAND NAME(S): Prolia, XGEVA What should I tell my health care provider before I take this medicine? They need to know if you have any of these conditions: -dental disease -eczema -infection or history of infections -kidney disease or on dialysis -low blood calcium or vitamin D -malabsorption syndrome -scheduled to have surgery or tooth extraction -taking medicine that contains denosumab -thyroid or parathyroid disease -an unusual reaction to denosumab, other medicines, foods, dyes, or preservatives -pregnant or trying to get pregnant -breast-feeding How should I use this medicine? This medicine is for injection under the skin. It is given by a health care professional in a hospital or clinic setting. If you are getting Prolia, a special MedGuide will be given to you by the pharmacist with each prescription and refill. Be sure to read this information carefully each time. For Prolia, talk to your pediatrician regarding the use of this medicine in children. Special care may be needed. For Xgeva, talk to your pediatrician regarding the use of this medicine in children. While this drug may be prescribed for children as young as 13 years for selected conditions, precautions do apply. Overdosage: If you think you've taken too much of this medicine contact a poison control center or emergency room at once. Overdosage: If you think you have taken too much of this medicine contact a poison control center or emergency room at once. NOTE: This medicine is only for  you. Do not share this medicine with others. What if I miss a dose? It is important not to miss your dose. Call your doctor or health care professional if you are unable to keep an appointment. What may interact with this medicine? Do not take this medicine with any of the following medications: -other medicines containing denosumab This medicine may also interact with the following medications: -medicines that suppress the immune system -medicines that treat cancer -steroid medicines like prednisone or cortisone This list may not describe all possible interactions. Give your health care provider a list of all the medicines, herbs, non-prescription drugs, or dietary supplements you use. Also tell them if you smoke, drink alcohol, or use illegal drugs. Some items may interact with your medicine. What should I watch for while using this medicine? Visit your doctor or health care professional for regular checks on your progress. Your doctor or health care professional may order blood tests and other tests to see how you are doing. Call your doctor or health care professional if you get a cold or other infection while receiving this medicine. Do not treat yourself. This medicine may decrease your body's ability to fight infection. You should make sure you get enough calcium and vitamin D while you are taking this medicine, unless your doctor tells you not to. Discuss the foods you eat and the vitamins you take with your health care professional. See your dentist regularly. Brush and floss your teeth as directed. Before you have any dental work done, tell your dentist you are receiving this medicine. Do not become pregnant while taking this medicine or for 5 months after stopping   it. Women should inform their doctor if they wish to become pregnant or think they might be pregnant. There is a potential for serious side effects to an unborn child. Talk to your health care professional or pharmacist for more  information. What side effects may I notice from receiving this medicine? Side effects that you should report to your doctor or health care professional as soon as possible: -allergic reactions like skin rash, itching or hives, swelling of the face, lips, or tongue -breathing problems -chest pain -fast, irregular heartbeat -feeling faint or lightheaded, falls -fever, chills, or any other sign of infection -muscle spasms, tightening, or twitches -numbness or tingling -skin blisters or bumps, or is dry, peels, or red -slow healing or unexplained pain in the mouth or jaw -unusual bleeding or bruising Side effects that usually do not require medical attention (Report these to your doctor or health care professional if they continue or are bothersome.): -muscle pain -stomach upset, gas This list may not describe all possible side effects. Call your doctor for medical advice about side effects. You may report side effects to FDA at 1-800-FDA-1088. Where should I keep my medicine? This medicine is only given in a clinic, doctor's office, or other health care setting and will not be stored at home. NOTE: This sheet is a summary. It may not cover all possible information. If you have questions about this medicine, talk to your doctor, pharmacist, or health care provider.  2015, Elsevier/Gold Standard. (2011-11-10 12:37:47)  

## 2016-02-27 LAB — CANCER ANTIGEN 27.29: CA 27.29: 21.5 U/mL (ref 0.0–38.6)

## 2016-02-29 ENCOUNTER — Encounter: Payer: Self-pay | Admitting: Oncology

## 2016-02-29 ENCOUNTER — Telehealth: Payer: Self-pay | Admitting: Oncology

## 2016-03-02 ENCOUNTER — Encounter: Payer: Self-pay | Admitting: Oncology

## 2016-03-02 NOTE — Progress Notes (Signed)
Received fax on 03/02/16 from [facility] Letrozole 2.5mg  tablet do not need an authorization.

## 2016-03-17 ENCOUNTER — Other Ambulatory Visit: Payer: Self-pay | Admitting: Internal Medicine

## 2016-03-18 ENCOUNTER — Telehealth: Payer: Self-pay | Admitting: *Deleted

## 2016-03-18 NOTE — Telephone Encounter (Signed)
Patient called and states she is having upper back pain and her neurosurgeon has retired.  When I spoke with the patient she was expecting a call from Dr Sherwood Gambler in regard to the pain and taking over as her doctor.  She will discuss this issue with Dr Melford Aase at her 10/27/201 7 visit.

## 2016-03-21 ENCOUNTER — Encounter: Payer: Self-pay | Admitting: Internal Medicine

## 2016-03-21 ENCOUNTER — Ambulatory Visit (INDEPENDENT_AMBULATORY_CARE_PROVIDER_SITE_OTHER): Payer: Medicare Other | Admitting: Internal Medicine

## 2016-03-21 VITALS — BP 142/80 | HR 88 | Temp 97.7°F | Resp 16 | Ht 61.5 in | Wt 120.8 lb

## 2016-03-21 DIAGNOSIS — Z79899 Other long term (current) drug therapy: Secondary | ICD-10-CM | POA: Diagnosis not present

## 2016-03-21 DIAGNOSIS — I10 Essential (primary) hypertension: Secondary | ICD-10-CM

## 2016-03-21 DIAGNOSIS — Z23 Encounter for immunization: Secondary | ICD-10-CM | POA: Diagnosis not present

## 2016-03-21 DIAGNOSIS — R7303 Prediabetes: Secondary | ICD-10-CM

## 2016-03-21 DIAGNOSIS — R0989 Other specified symptoms and signs involving the circulatory and respiratory systems: Secondary | ICD-10-CM

## 2016-03-21 DIAGNOSIS — E559 Vitamin D deficiency, unspecified: Secondary | ICD-10-CM

## 2016-03-21 DIAGNOSIS — E782 Mixed hyperlipidemia: Secondary | ICD-10-CM

## 2016-03-21 NOTE — Progress Notes (Signed)
[facility] [name], M.D.        Uvaldo Bristle. Silverio Lay, P.A.-C       Starlyn Skeans, P.A.-C  [facility]                [address] [place], [place] [address] Telephone [phone] Telefax [phone] ______________________________________________________________________     This very nice 75 y.o.DWF presents for 6 month follow up with Hypertension, Hyperlipidemia, Pre-Diabetes and Vitamin D Deficiency.  Patient has hx/o Stage 4 Breast Ca metastatic to T3 Vertebrae and today she presents with c/o upper back pain in the area of her known T3 Vertebrae metastasis.      Patient had R breast lumpectomy (1984) radiated and tx'd w/Tamoxifen.  In Mar 2016 she was found to have a L Breast Ca metastatic to T3 vertebrae and she underwent T3 decompression followed by T1-T5 radiation, then Chemotherapy for Stage 4 Breast Ca, later followed w/ maintenance Letrozole per Dr Jana Hakim. Recently she had CT scans of the neck/chest showing Met at T3 Vertebrae.      Patient is also followed expectantly for labile HTN & BP has been controlled at home. Today's BP is 142/80.  Patient has had no complaints of any cardiac type chest pain, palpitations, dyspnea/orthopnea/PND, dizziness, claudication, or dependent edema.     Hyperlipidemia is controlled with diet & meds. Patient denies myalgias or other med SE's. Last Lipids were not at goal: Lab Results  Component Value Date   CHOL 197 09/19/2015   HDL 56 09/19/2015   LDLCALC 119 09/19/2015   TRIG 109 09/19/2015   CHOLHDL 3.5 09/19/2015      Also, the patient is followed proactively for abnormal glucose and has had no symptoms of reactive hypoglycemia, diabetic polys, paresthesias or visual blurring.  Last A1c was normal: Lab Results  Component Value Date   HGBA1C 5.2 09/19/2015      Further, the patient also has history of Vitamin D Deficiency and supplements vitamin D  without any suspected side-effects. Last vitamin D was at goal: Lab Results  Component Value Date   VD25OH 66 09/19/2015   Current Outpatient Prescriptions on File Prior to Visit  Medication Sig  . VITAMIN C Take 1 tablet by mouth every other day. Takes qod  . aspirin EC 325 MG  Take 1 tablet (325 mg total) by mouth daily.  . TUMS 500 MG  Chew 2 tablets by mouth 2 (two) times daily.   Marland Kitchen VITAMIN D3 ADULT GUMMIES  Take 4,000 Units  daily.  Marland Kitchen VITAMIN B-12 tab  Takes every other day  . GERITOL Take  daily.  Marland Kitchen letrozole  2.5 MG  TAKE 1 TAB DAILY  . nabumetone  500 MG  TK 1 T PO BID   Allergies  Allergen Reactions  . Ativan [Lorazepam]     Made her crazy  . Dilaudid [Hydromorphone Hcl]     Doesn't want  . Haldol [Haloperidol Lactate]     Made her crazy   PMHx:   Past Medical History:  Diagnosis Date  . Arthritis   . Breast cancer [facility]) 1985/2016   takes [name] daily  . Chronic back pain    stenosis/listhesis  . Family history of ovarian cancer   . Osteoporosis    takes Vit D  . Radiation 11/23/14-12/11/14   30 Gy T1-T5    Immunization History  Administered  Date(s) Administered  . Influenza Split 02/07/2013  . Influenza, High Dose Seasonal PF 03/14/2015  . Td 01/05/2008   Past Surgical History:  Procedure Laterality Date  . ABDOMINAL HYSTERECTOMY     1980  . APPENDECTOMY  1977  . APPLICATION OF INTRAOPERATIVE CT SCAN N/A 10/20/2014   Procedure: APPLICATION OF INTRAOPERATIVE CAT SCAN;  Surgeon: [name], MD;  Location: [place] [facility];  Service: Neurosurgery;  Laterality: N/A;  . BREAST SURGERY Right 1985  . THYROID CYST EXCISION  1967   FHx:    Reviewed / unchanged  SHx:    Reviewed / unchanged  Systems Review:  Constitutional: Denies fever, chills, wt changes, headaches, insomnia, fatigue, night sweats, change in appetite. Eyes: Denies redness, blurred vision, diplopia, discharge, itchy, watery eyes.  ENT: Denies discharge, congestion, post nasal drip,  epistaxis, sore throat, earache, hearing loss, dental pain, tinnitus, vertigo, sinus pain, snoring.  CV: Denies chest pain, palpitations, irregular heartbeat, syncope, dyspnea, diaphoresis, orthopnea, PND, claudication or edema. Respiratory: denies cough, dyspnea, DOE, pleurisy, hoarseness, laryngitis, wheezing.  Gastrointestinal: Denies dysphagia, odynophagia, heartburn, reflux, water brash, abdominal pain or cramps, nausea, vomiting, bloating, diarrhea, constipation, hematemesis, melena, hematochezia  or hemorrhoids. Genitourinary: Denies dysuria, frequency, urgency, nocturia, hesitancy, discharge, hematuria or flank pain. Musculoskeletal: Denies arthralgias, myalgias, stiffness, jt. swelling, pain, limping or strain/sprain.  Skin: Denies pruritus, rash, hives, warts, acne, eczema or change in skin lesion(s). Neuro: No weakness, tremor, incoordination, spasms, paresthesia or pain. Psychiatric: Denies confusion, memory loss or sensory loss. Endo: Denies change in weight, skin or hair change.  Heme/Lymph: No excessive bleeding, bruising or enlarged lymph nodes.  Physical Exam BP (!) 142/80   Pulse 88   Temp 97.7 F (36.5 C)   Resp 16   Ht 5' 1.5" (1.562 m)   Wt 120 lb 12.8 oz (54.8 kg)   BMI 22.46 kg/m   Appears well nourished and in no distress.  Eyes: PERRLA, EOMs, conjunctiva no swelling or erythema. Sinuses: No frontal/maxillary tenderness ENT/Mouth: EAC's clear, TM's nl w/o erythema, bulging. Nares clear w/o erythema, swelling, exudates. Oropharynx clear without erythema or exudates. Oral hygiene is good. Tongue normal, non obstructing. Hearing intact.  Neck: Supple. Thyroid nl. Car 2+/2+ without bruits, nodes or JVD. Chest: Respirations nl with BS clear & equal w/o rales, rhonchi, wheezing or stridor.  Cor: Heart sounds normal w/ regular rate and rhythm without sig. murmurs, gallops, clicks, or rubs. Peripheral pulses normal and equal  without edema.  Abdomen: Soft & bowel sounds  normal. Non-tender w/o guarding, rebound, hernias, masses, or organomegaly.  Lymphatics: Unremarkable.  Musculoskeletal: Full ROM all peripheral extremities, joint stability, 5/5 strength, and normal gait. Mild gibbous type deformity at T3 area of surgery with trigger point tenderness and also trigger point tender areas over the Right scapulae and mid Right posterior and lateral ribs to palpitation.  Skin: Warm, dry without exposed rashes, lesions or ecchymosis apparent.  Neuro: Cranial nerves intact, reflexes equal bilaterally. Sensory-motor testing grossly intact. Tendon reflexes grossly intact.  Pysch: Alert & oriented x 3.  Insight and judgement nl & appropriate. No ideations.  Assessment and Plan:  1. Labile hypertension  - Continue  monitor blood pressure at home. Continue DASH diet.  - Reminder to go to the ER if any exertional CP, SOB, nausea, dizziness, severe HA, changes vision/speech, left arm numbness and tingling and jaw pain.  2. Mixed hyperlipidemia  - Continue diet/meds, exercise,& lifestyle modifications. Continue monitor periodic cholesterol/liver & renal functions   3. Prediabetes  -  Continue diet, exercise, lifestyle modifications. Monitor appropriate labs  4. Vitamin D deficiency  - Continue supplementation.  5. Medication management  6. T3 Vertebral, R scapular and rib tenderness / pain  - Advised patient to discuss with Dr. Jana Hakim whether a PET scan might possibly be appropriate to evaluate the tender/painful areas for metastatic disease activity.       Recommended graduated exercise as tolerated, BP monitoring and discussed med and SE's. Recommended labs to include Lipid profile , TSH, Mg, Vit D , A1c & Insulin level and she preferred to have those dove at her upcoming monthly blood draws at the [facility].  Further disposition pending results of labs. Over 30 minutes of exam, counseling, chart review was performed

## 2016-03-21 NOTE — Patient Instructions (Signed)

## 2016-03-24 ENCOUNTER — Telehealth: Payer: Self-pay

## 2016-03-24 DIAGNOSIS — E782 Mixed hyperlipidemia: Secondary | ICD-10-CM | POA: Diagnosis not present

## 2016-03-24 DIAGNOSIS — I10 Essential (primary) hypertension: Secondary | ICD-10-CM | POA: Diagnosis not present

## 2016-03-24 DIAGNOSIS — Z23 Encounter for immunization: Secondary | ICD-10-CM

## 2016-03-24 DIAGNOSIS — Z79899 Other long term (current) drug therapy: Secondary | ICD-10-CM | POA: Diagnosis not present

## 2016-03-24 DIAGNOSIS — R7303 Prediabetes: Secondary | ICD-10-CM | POA: Diagnosis not present

## 2016-03-24 DIAGNOSIS — E559 Vitamin D deficiency, unspecified: Secondary | ICD-10-CM | POA: Diagnosis not present

## 2016-03-24 NOTE — Addendum Note (Signed)
Addended by: Melbourne Abts C on: 03/24/2016 09:44 AM   Modules accepted: Orders

## 2016-03-24 NOTE — Telephone Encounter (Signed)
Pt called reporting back pain from spine over tot he right below the shoulder blade.  She is in office tomorrow for injection and wants to know if Dr. Jana Hakim can speak with her just for a minute.  This started in March.     When she sits up straight on a hard surface she will have "little sharp pains like someone is throwing needles at me".  When reclining she has only a little pain.  She has appt with neuro can't get in right away.    Chart reviewed.  I called patient back: Dr. Jana Hakim offered pain med in September which she refused.  Pt reports the sharp pains from sitting up have started within last two weeks.  She has appt with [facility] Thursday.  I advised pt GM has no openings tomorrow.  If she needs to see someone before her neuro appt on Thursday, we can get her in to see NP Retta Mac.  Alternatively, I can speak with GM for gabapenting Rx as discussed in Sept OV.    Pt would like to see [facility] NP.  Appt requested.  Phone note routed to [employer].

## 2016-03-25 ENCOUNTER — Other Ambulatory Visit (HOSPITAL_BASED_OUTPATIENT_CLINIC_OR_DEPARTMENT_OTHER): Payer: Medicare Other

## 2016-03-25 ENCOUNTER — Ambulatory Visit (HOSPITAL_BASED_OUTPATIENT_CLINIC_OR_DEPARTMENT_OTHER): Payer: Medicare Other

## 2016-03-25 VITALS — BP 157/80 | HR 98 | Temp 97.7°F | Resp 18

## 2016-03-25 DIAGNOSIS — C50919 Malignant neoplasm of unspecified site of unspecified female breast: Secondary | ICD-10-CM

## 2016-03-25 DIAGNOSIS — C7951 Secondary malignant neoplasm of bone: Secondary | ICD-10-CM

## 2016-03-25 DIAGNOSIS — C50412 Malignant neoplasm of upper-outer quadrant of left female breast: Secondary | ICD-10-CM

## 2016-03-25 DIAGNOSIS — M810 Age-related osteoporosis without current pathological fracture: Secondary | ICD-10-CM

## 2016-03-25 DIAGNOSIS — C50912 Malignant neoplasm of unspecified site of left female breast: Secondary | ICD-10-CM | POA: Diagnosis not present

## 2016-03-25 LAB — CBC WITH DIFFERENTIAL/PLATELET
BASO%: 0.8 % (ref 0.0–2.0)
BASOS ABS: 0 10*3/uL (ref 0.0–0.1)
EOS ABS: 0.1 10*3/uL (ref 0.0–0.5)
EOS%: 2.3 % (ref 0.0–7.0)
HCT: 44.3 % (ref 34.8–46.6)
HGB: 15.2 g/dL (ref 11.6–15.9)
LYMPH%: 20 % (ref 14.0–49.7)
MCH: 30.6 pg (ref 25.1–34.0)
MCHC: 34.3 g/dL (ref 31.5–36.0)
MCV: 89.1 fL (ref 79.5–101.0)
MONO#: 0.4 10*3/uL (ref 0.1–0.9)
MONO%: 8.5 % (ref 0.0–14.0)
NEUT%: 68.4 % (ref 38.4–76.8)
NEUTROS ABS: 3.6 10*3/uL (ref 1.5–6.5)
PLATELETS: 220 10*3/uL (ref 145–400)
RBC: 4.97 10*6/uL (ref 3.70–5.45)
RDW: 14 % (ref 11.2–14.5)
WBC: 5.2 10*3/uL (ref 3.9–10.3)
lymph#: 1 10*3/uL (ref 0.9–3.3)

## 2016-03-25 LAB — COMPREHENSIVE METABOLIC PANEL
ALT: 15 U/L (ref 0–55)
ANION GAP: 11 meq/L (ref 3–11)
AST: 20 U/L (ref 5–34)
Albumin: 3.9 g/dL (ref 3.5–5.0)
Alkaline Phosphatase: 45 U/L (ref 40–150)
BILIRUBIN TOTAL: 0.31 mg/dL (ref 0.20–1.20)
BUN: 16.4 mg/dL (ref 7.0–26.0)
CO2: 24 meq/L (ref 22–29)
Calcium: 10.3 mg/dL (ref 8.4–10.4)
Chloride: 107 mEq/L (ref 98–109)
Creatinine: 0.7 mg/dL (ref 0.6–1.1)
EGFR: 84 mL/min/{1.73_m2} — AB (ref >90)
Glucose: 78 mg/dl (ref 70–140)
Potassium: 3.9 mEq/L (ref 3.5–5.1)
Sodium: 142 mEq/L (ref 136–145)
TOTAL PROTEIN: 7.7 g/dL (ref 6.4–8.3)

## 2016-03-25 MED ORDER — DENOSUMAB 120 MG/1.7ML ~~LOC~~ SOLN
120.0000 mg | Freq: Once | SUBCUTANEOUS | Status: AC
  Administered 2016-03-25: 120 mg via SUBCUTANEOUS
  Filled 2016-03-25: qty 1.7

## 2016-03-25 NOTE — Patient Instructions (Signed)
Denosumab injection What is this medicine? DENOSUMAB (den oh sue mab) slows bone breakdown. Prolia is used to treat osteoporosis in women after menopause and in men. Xgeva is used to prevent bone fractures and other bone problems caused by cancer bone metastases. Xgeva is also used to treat giant cell tumor of the bone. This medicine may be used for other purposes; ask your health care provider or pharmacist if you have questions. COMMON BRAND NAME(S): Prolia, XGEVA What should I tell my health care provider before I take this medicine? They need to know if you have any of these conditions: -dental disease -eczema -infection or history of infections -kidney disease or on dialysis -low blood calcium or vitamin D -malabsorption syndrome -scheduled to have surgery or tooth extraction -taking medicine that contains denosumab -thyroid or parathyroid disease -an unusual reaction to denosumab, other medicines, foods, dyes, or preservatives -pregnant or trying to get pregnant -breast-feeding How should I use this medicine? This medicine is for injection under the skin. It is given by a health care professional in a hospital or clinic setting. If you are getting Prolia, a special MedGuide will be given to you by the pharmacist with each prescription and refill. Be sure to read this information carefully each time. For Prolia, talk to your pediatrician regarding the use of this medicine in children. Special care may be needed. For Xgeva, talk to your pediatrician regarding the use of this medicine in children. While this drug may be prescribed for children as young as 13 years for selected conditions, precautions do apply. Overdosage: If you think you've taken too much of this medicine contact a poison control center or emergency room at once. Overdosage: If you think you have taken too much of this medicine contact a poison control center or emergency room at once. NOTE: This medicine is only for  you. Do not share this medicine with others. What if I miss a dose? It is important not to miss your dose. Call your doctor or health care professional if you are unable to keep an appointment. What may interact with this medicine? Do not take this medicine with any of the following medications: -other medicines containing denosumab This medicine may also interact with the following medications: -medicines that suppress the immune system -medicines that treat cancer -steroid medicines like prednisone or cortisone This list may not describe all possible interactions. Give your health care provider a list of all the medicines, herbs, non-prescription drugs, or dietary supplements you use. Also tell them if you smoke, drink alcohol, or use illegal drugs. Some items may interact with your medicine. What should I watch for while using this medicine? Visit your doctor or health care professional for regular checks on your progress. Your doctor or health care professional may order blood tests and other tests to see how you are doing. Call your doctor or health care professional if you get a cold or other infection while receiving this medicine. Do not treat yourself. This medicine may decrease your body's ability to fight infection. You should make sure you get enough calcium and vitamin D while you are taking this medicine, unless your doctor tells you not to. Discuss the foods you eat and the vitamins you take with your health care professional. See your dentist regularly. Brush and floss your teeth as directed. Before you have any dental work done, tell your dentist you are receiving this medicine. Do not become pregnant while taking this medicine or for 5 months after stopping   it. Women should inform their doctor if they wish to become pregnant or think they might be pregnant. There is a potential for serious side effects to an unborn child. Talk to your health care professional or pharmacist for more  information. What side effects may I notice from receiving this medicine? Side effects that you should report to your doctor or health care professional as soon as possible: -allergic reactions like skin rash, itching or hives, swelling of the face, lips, or tongue -breathing problems -chest pain -fast, irregular heartbeat -feeling faint or lightheaded, falls -fever, chills, or any other sign of infection -muscle spasms, tightening, or twitches -numbness or tingling -skin blisters or bumps, or is dry, peels, or red -slow healing or unexplained pain in the mouth or jaw -unusual bleeding or bruising Side effects that usually do not require medical attention (Report these to your doctor or health care professional if they continue or are bothersome.): -muscle pain -stomach upset, gas This list may not describe all possible side effects. Call your doctor for medical advice about side effects. You may report side effects to FDA at 1-800-FDA-1088. Where should I keep my medicine? This medicine is only given in a clinic, doctor's office, or other health care setting and will not be stored at home. NOTE: This sheet is a summary. It may not cover all possible information. If you have questions about this medicine, talk to your doctor, pharmacist, or health care provider.  2015, Elsevier/Gold Standard. (2011-11-10 12:37:47)  

## 2016-03-26 ENCOUNTER — Telehealth: Payer: Self-pay | Admitting: *Deleted

## 2016-03-26 LAB — CANCER ANTIGEN 27.29: CAN 27.29: 19.8 U/mL (ref 0.0–38.6)

## 2016-03-26 NOTE — Progress Notes (Signed)
  Oncology Nurse Navigator Documentation  Navigator Location: [facility] (03/26/16 1300)   )Navigator Encounter Type: Telephone (03/26/16 1300)    Patient scheduled to see Mikey Bussing, NP tomorrow at 1000.  NP requested that appointment be changed from 30 to 45 minutes.  I called patient and informed her and sent request for change in appointment time to scheduling.                     Barriers/Navigation Needs: Coordination of Care (03/26/16 1300)   Interventions: Coordination of Care (03/26/16 1300)                      Time Spent with Patient: 15 (03/26/16 1300)

## 2016-03-27 ENCOUNTER — Ambulatory Visit (HOSPITAL_BASED_OUTPATIENT_CLINIC_OR_DEPARTMENT_OTHER): Payer: Medicare Other | Admitting: Oncology

## 2016-03-27 ENCOUNTER — Encounter: Payer: Self-pay | Admitting: Oncology

## 2016-03-27 VITALS — BP 150/74 | HR 105 | Temp 97.5°F | Resp 16 | Ht 61.5 in | Wt 121.8 lb

## 2016-03-27 DIAGNOSIS — M5414 Radiculopathy, thoracic region: Secondary | ICD-10-CM | POA: Diagnosis not present

## 2016-03-27 DIAGNOSIS — C7951 Secondary malignant neoplasm of bone: Secondary | ICD-10-CM | POA: Diagnosis not present

## 2016-03-27 DIAGNOSIS — G8918 Other acute postprocedural pain: Secondary | ICD-10-CM

## 2016-03-27 DIAGNOSIS — M5412 Radiculopathy, cervical region: Secondary | ICD-10-CM | POA: Diagnosis not present

## 2016-03-27 DIAGNOSIS — M542 Cervicalgia: Secondary | ICD-10-CM | POA: Diagnosis not present

## 2016-03-27 DIAGNOSIS — C50412 Malignant neoplasm of upper-outer quadrant of left female breast: Secondary | ICD-10-CM | POA: Diagnosis not present

## 2016-03-27 DIAGNOSIS — M549 Dorsalgia, unspecified: Secondary | ICD-10-CM | POA: Diagnosis not present

## 2016-03-27 DIAGNOSIS — E041 Nontoxic single thyroid nodule: Secondary | ICD-10-CM

## 2016-03-27 DIAGNOSIS — G629 Polyneuropathy, unspecified: Secondary | ICD-10-CM | POA: Insufficient documentation

## 2016-03-27 DIAGNOSIS — M546 Pain in thoracic spine: Secondary | ICD-10-CM | POA: Diagnosis not present

## 2016-03-27 MED ORDER — GABAPENTIN 300 MG PO CAPS: 300.0000 mg | ORAL_CAPSULE | Freq: Every day | ORAL | 1 refills | Status: DC

## 2016-03-27 NOTE — Progress Notes (Signed)
[name]  Telephone:(336) [phone] Fax:(336) [phone]   ID: April Rosales DOB: Dec 28, 1940  MR#: [id number]  RFF#:[id number]  Patient Care Team: [name], MD as PCP - General (Internal Medicine) Chauncey Cruel, MD as Consulting Physician (Oncology) PCP: [name], MD GYN: SU: [name] M.D. OTHER MD:  Rodell Perna M.D., [name] MD, Stephannie Peters M.D.  CHIEF COMPLAINT: Stage IV breast cancer  CURRENT TREATMENT:  Letrozole; denosumab/Xgeva  BREAST CANCER HISTORY: From the original intake note:   April Rosales underwent right lumpectomy in 1985 at [facility] for what sounds like a stage I breast cancer. She tells me she had more than 30 lymph nodes removed from her right axilla and all of them were clear. She received  adjuvant radiation but no systemic treatment.  The patient had recently refused mammography with the last mammogram I can find dating back to August 2010.  More recently the patient presented with left scapular pain radiating down the left arm.  She was evaluated by Dr. [name], who obtained a chest x-ray showing a possible abnormality at T3. He then set up the patient for an MRI of the thoracic spine performed 08/16/2014.  This showed multiple compression fractures  (a similar picture had been noted on lumbar MRI 10/09/2011 ). However at T3 they noted tumor in the vertebral body extending into the left pedicle and into the lateral epidural space, displacing the cord to the right. There was no evidence of cord compression or cord signal abnormality. There were no other areas of tumor identified in the thoracic spine.         The patient was then referred to Dr. Hal Neer who on 08/24/2014 set April Rosales up for CT scans of the chest, abdomen and pelvis. There was a dense mass in the upper inner quadrant of the left breast measuring 1.6 cm. There was a 1.2 cm nodule in the minor fissure of the right lung and some evidence of right  lung fibrosis at the site of the prior radiation port.  There was also a thyroid mass measuring 2 cm. However there were no parenchymal lung or liver lesions. Incidental meningoceles were noted as well as sclerosis of the fifth and sixth ribs which were felt to be likely posttraumatic.   On 08/29/2014 the patient underwent bilateral diagnostic mammography with tomosynthesis and left breast ultrasonography.  There were postsurgical changes in the upper right breast. In the left breast there was an irregular mass measuring 2.3 cm in the upper inner quadrant. This was palpable. Ultrasound showed this to be hypoechoic and to measure 2.0 cm. There were adjacent areas of nodularity. There was no definite lymphadenopathy in the left axilla.  Biopsy of this breast mass 08/29/2014 showed an invasive adenocarcinoma with both ductal and lobular features (there was strong diffuse E-cadherin expression as well as areas with total absence of E-cadherin expression), with the preliminary prognostic profile showing strong estrogen positivity, very weak to near absent progesterone positivity, an MIB-1 of approximately 40%, and HER-2 equivocal  The patient's subsequent history is as detailed below.  INTERVAL HISTORY: April Rosales returns today for a work in appointment. The patient has estrogen receptor positive stage IV breast cancer. She is accompanied by her sister. April Rosales is on daily letrozole, with good tolerance. Hot flashes and vaginal dryness are not a major issue. She never developed the arthralgias or myalgias that many patients can experience on this medication. She obtains it at a good price. She is also on Xgeva/denosumab  every 28 days. She has had no issues with those treatments so far.  REVIEW OF SYSTEMS: April Rosales seems to have pain from the surgical site radiating to the right neck or right shoulder. When she sits in certain positions it worsens. When she lays down or reclines the pain resolves. She has had this  issue for about 3-4 months. She was given Relafen by her neurosurgeon, but never really started this. She recently started this about 2 weeks ago and is taking this twice a day. She was offered gabapentin at her last visit in our office in September 2017 but declined it. She is open to trying gabapentin at this time. She has a follow-up appointment scheduled with neurosurgery later this afternoon. Otherwise a detailed review of systems today was stable  PAST MEDICAL HISTORY: Past Medical History:  Diagnosis Date  . Arthritis   . Breast cancer [facility]) 1985/2016   takes [name] daily  . Chronic back pain    stenosis/listhesis  . Family history of ovarian cancer   . Osteoporosis    takes Vit D  . Radiation 11/23/14-12/11/14   30 Gy T1-T5     PAST SURGICAL HISTORY: Past Surgical History:  Procedure Laterality Date  . ABDOMINAL HYSTERECTOMY     1980  . APPENDECTOMY  1977  . APPLICATION OF INTRAOPERATIVE CT SCAN N/A 10/20/2014   Procedure: APPLICATION OF INTRAOPERATIVE CAT SCAN;  Surgeon: [name], MD;  Location: [place] [facility];  Service: Neurosurgery;  Laterality: N/A;  . BREAST SURGERY Right 1985  . THYROID CYST EXCISION  1967    FAMILY HISTORY Family History  Problem Relation Age of Onset  . Heart disease Mother   . Hypertension Mother   . Heart disease Father   . Diabetes Father   . Breast cancer Paternal Aunt     4 paternal aunts with breast cancer over 83  . Prostate cancer Paternal Uncle   . Stroke Paternal Grandfather   . Ovarian cancer Paternal Aunt   . Huntington's disease Other     Nephew, inherited from his father   The patient's father died from a heart attack at the age of 63. He had 9 sisters. 3 of those sisters had breast cancer, all in a menopausal setting. Another sister had ovarian cancer. One of the paternal uncles had cancer of the colon "and back".  The patient's mother died at the age of 66. She was found to have breast cancer shortly before dying , during  her final hospitalization.  GYNECOLOGIC HISTORY:  No LMP recorded. Patient has had a hysterectomy.  Menarche age 59, first live birth age 89, the patient is GX P1. She underwent hysterectomy in 1980. She thinks the ovaries were removed, but the CT scan obtained 08/24/2014 showed a definite right ovary. The left ovary may have been removed. She did not take hormone replacement after the hysterectomy.  SOCIAL HISTORY:   Lenora worked in Psychiatric nurse. She is divorced, lives alone, with 2 cats. Her son April Rosales sharp lives in [place] where he works in Engineer, technical sales. He has 4 children of his own. The patient is a Psychologist, forensic.    ADVANCED DIRECTIVES:  In place. The patient has named her sister April Rosales [phone]) and her friend April Rosales [phone]) as joint healthcare powers of attorney   HEALTH MAINTENANCE: Social History  Substance Use Topics  . Smoking status: Former Smoker    Packs/day: 0.50    Years: 10.00    Types: Cigarettes  . Smokeless tobacco: Former Systems developer  Comment: quit smoking 1970's  . Alcohol use 0.0 oz/week     Comment: Rare     Colonoscopy: never  PAP: status post hysterectomy  Bone density: 08/14/2014 at [facility], results pending  Lipid panel:  Allergies  Allergen Reactions  . Ativan [Lorazepam]     Made her crazy  . Dilaudid [Hydromorphone Hcl]     Doesn't want  . Haldol [Haloperidol Lactate]     Made her crazy    Current Outpatient Prescriptions  Medication Sig Dispense Refill  . Ascorbic Acid (VITAMIN C PO) Take 1 tablet by mouth every other day. Takes qod    . aspirin EC 325 MG tablet Take 1 tablet (325 mg total) by mouth daily. 30 tablet 0  . calcium carbonate (TUMS) 500 MG chewable tablet Chew 2 tablets by mouth 2 (two) times daily.     . Cholecalciferol (VITAMIN D3 ADULT GUMMIES PO) Take 4,000 Units by mouth daily.    . Cyanocobalamin (VITAMIN B-12 PO) Take by mouth daily. Takes every other day    . Iron-Vitamins (GERITOL PO) Take by  mouth daily.    Marland Kitchen letrozole (FEMARA) 2.5 MG tablet TAKE 1 TABLET(2.5 MG) BY MOUTH DAILY 90 tablet 0  . nabumetone (RELAFEN) 500 MG tablet TK 1 T PO BID  3   No current facility-administered medications for this visit.     OBJECTIVE:  Older white woman in no acute distress Vitals:   03/27/16 1008  BP: (!) 150/74  Pulse: (!) 105  Resp: 16  Temp: 97.5 F (36.4 C)     Body mass index is 22.64 kg/m.    ECOG FS:1 - Symptomatic but completely ambulatory  Sclerae unicteric, pupils round and equal Oropharynx clear and moist-- no thrush or other lesions No cervical or supraclavicular adenopathy Lungs no rales or rhonchi Heart regular rate and rhythm Abd soft, nontender, positive bowel sounds MSK no focal spinal tenderness, no upper extremity lymphedema Neuro: nonfocal, well oriented, appropriate affect Breasts: Deferred.    LAB RESULTS:  CMP     Component Value Date/Time   NA 142 03/25/2016 1113   K 3.9 03/25/2016 1113   CL 103 10/13/2014 1427   CO2 24 03/25/2016 1113   GLUCOSE 78 03/25/2016 1113   BUN 16.4 03/25/2016 1113   CREATININE 0.7 03/25/2016 1113   CALCIUM 10.3 03/25/2016 1113   PROT 7.7 03/25/2016 1113   ALBUMIN 3.9 03/25/2016 1113   AST 20 03/25/2016 1113   ALT 15 03/25/2016 1113   ALKPHOS 45 03/25/2016 1113   BILITOT 0.31 03/25/2016 1113   GFRNONAA >60 10/13/2014 1427   GFRNONAA >89 08/21/2014 1713   GFRAA >60 10/13/2014 1427   GFRAA >89 08/21/2014 1713    INo results found for: SPEP, UPEP  Lab Results  Component Value Date   WBC 5.2 03/25/2016   NEUTROABS 3.6 03/25/2016   HGB 15.2 03/25/2016   HCT 44.3 03/25/2016   MCV 89.1 03/25/2016   PLT 220 03/25/2016      Chemistry      Component Value Date/Time   NA 142 03/25/2016 1113   K 3.9 03/25/2016 1113   CL 103 10/13/2014 1427   CO2 24 03/25/2016 1113   BUN 16.4 03/25/2016 1113   CREATININE 0.7 03/25/2016 1113      Component Value Date/Time   CALCIUM 10.3 03/25/2016 1113   ALKPHOS 45  03/25/2016 1113   AST 20 03/25/2016 1113   ALT 15 03/25/2016 1113   BILITOT 0.31 03/25/2016 1113  Lab Results  Component Value Date   LABCA2 24 01/01/2016    No components found for: [id number]  No results for input(s): INR in the last 168 hours.  Urinalysis    Component Value Date/Time   COLORURINE YELLOW 09/19/2015 1239   APPEARANCEUR CLOUDY (A) 09/19/2015 1239   LABSPEC 1.015 09/19/2015 1239   PHURINE 7.5 09/19/2015 1239   GLUCOSEU NEGATIVE 09/19/2015 1239   HGBUR NEGATIVE 09/19/2015 1239   BILIRUBINUR NEGATIVE 09/19/2015 1239   KETONESUR NEGATIVE 09/19/2015 1239   PROTEINUR NEGATIVE 09/19/2015 1239   NITRITE NEGATIVE 09/19/2015 1239   LEUKOCYTESUR NEGATIVE 09/19/2015 1239    STUDIES:No results found.    ASSESSMENT: 75 y.o. [place] woman with stage IV left breast cancer involving bone  (1) status post right lumpectomy and axillary node dissection in 1985 followed by radiation at [facility] 08/16/2014: measurable disease in spine, lung and left breast   (2) evaluation for left shoulder pain led to thoracic spine MRI 08/16/2014 showing a pathologic fracture at T3 with epidural tumor displacing the cord to the right, but no cord compression. CT scans of the chest, abdomen and pelvis 08/24/2014 showed in addition a mass in the upper outer quadrant left breast measuring 1.6 cm and a nodule in the minor fissure of the right lung measuring 1.2 cm, but no parenchymal lung or liver lesions. CA 27-29 was noninformative at 38 (09/20/2014)    (3) mammography and ultrasonography 08/29/2014 show a mass in the upper inner left breast which was palpable,  measuring 2.0 cm by ultrasound. Biopsy of this mass 08/29/2014 showed an invasive breast cancer with both lobular and ductal features, estrogen receptor positive, progesterone receptor weakly positive, with an MIB-1 in the 40% range, HER-2 equivocal (6 else ratio 1.5, but average number her nucleus 5.8)   (4)  letrozole started 08/31/2014;   (a) palbociclib added Sept 2016 at 75 mg 21/7, with significant neutropenia; not repeated after first cycle   (5)  zolendronate started 09/20/2014, stopped after initial dose due to poor tolerance  (a) denosumab/ Xgeva started 01/31/2015, repeated every 4 weeks. Moved to every 8 weeks starting 07/17/15.    (6) on 10/20/2014 the patient underwent T2-T3 and T4 decompressive laminectomy with removal of epidural tumor, C7-T4 segmental pedicle screw instrumentation with virage screw system with arrow guidance protocol and C7-T4 posterolateral fusion. The cells were positive for the estrogen receptor. HER-2/neu testing by [facility] showed again equivocal results,  (7) radiation 11/23/2014-12/11/2014.  (a) T1-T5 was treated to 30 Gy in 12 fractions at 2.5 Gy per fraction   (8)  consider eventual left lumpectomy or mastectomy depending on  longer-term results of systemic therapy  (9) genetics testing using the Breast/Ovarian Cancer Panel through [name], MD) found no mutations in ATM, BARD1, BRCA1, BRCA2, BRIP1, CDH1, CHEK2, EPCAM, FANCC, MLH1, MSH2, MSH6, NBN, PALB2, PMS2, PTEN, RAD51C, RAD51D, STK11, TP53, or XRCC2    (10) right thyroid nodule is a complex cyst as noted on CT scan of the neck 01/29/2016  PLAN: Gracianna is tolerating her treatment remarkably well and by the recent CTs of the chest and neck there is no evidence of active disease. She remains on letrozole daily and Xgeva monthly.  She is now having more pain from her prior surgery. We discussed the previous recommendation of gabapentin and she is agreeable to trying this. I have sent a prescription for gabapentin 300 mg at bedtime. I explained to the patient that we may need to increase this dose. I  have encouraged her to discuss this medication with her neurosurgeon later today to see if he agrees with it  She will keep her appointments for labs and Xgeva injections and follow-up with Dr. Jana Hakim  not in January as previously scheduled.   We will be happy to see her sooner if needed.   Mikey Bussing, NP   03/27/2016 10:23 AM

## 2016-04-02 DIAGNOSIS — M50223 Other cervical disc displacement at C6-C7 level: Secondary | ICD-10-CM | POA: Diagnosis not present

## 2016-04-02 DIAGNOSIS — C7951 Secondary malignant neoplasm of bone: Secondary | ICD-10-CM | POA: Diagnosis not present

## 2016-04-02 DIAGNOSIS — M5124 Other intervertebral disc displacement, thoracic region: Secondary | ICD-10-CM | POA: Diagnosis not present

## 2016-04-02 DIAGNOSIS — M50222 Other cervical disc displacement at C5-C6 level: Secondary | ICD-10-CM | POA: Diagnosis not present

## 2016-04-02 DIAGNOSIS — C50919 Malignant neoplasm of unspecified site of unspecified female breast: Secondary | ICD-10-CM | POA: Diagnosis not present

## 2016-04-02 DIAGNOSIS — M50221 Other cervical disc displacement at C4-C5 level: Secondary | ICD-10-CM | POA: Diagnosis not present

## 2016-04-03 DIAGNOSIS — R030 Elevated blood-pressure reading, without diagnosis of hypertension: Secondary | ICD-10-CM | POA: Diagnosis not present

## 2016-04-03 DIAGNOSIS — M898X1 Other specified disorders of bone, shoulder: Secondary | ICD-10-CM | POA: Diagnosis not present

## 2016-04-03 DIAGNOSIS — C7951 Secondary malignant neoplasm of bone: Secondary | ICD-10-CM | POA: Diagnosis not present

## 2016-04-10 NOTE — Progress Notes (Signed)
Histology and Location of Primary Cancer:  Left Breast Biopsy of this breast mass 08/29/2014 showed an invasive adenocarcinoma with both ductal and lobular features (there was strong diffuse E-cadherin expression as well as areas with total absence of E-cadherin expression), with the preliminary prognostic profile showing strong estrogen positivity, very weak to near absent progesterone positivity, an MIB-1 of approximately 40%, and HER-2 equivocal     Sites of Visceral and Bony Metastatic Disease:  Thoracic Spine  Location(s) of Symptomatic Metastases:  T3- April Rosales seems to have pain from the surgical site radiating to the right neck or right shoulder. When she sits in certain positions it worsens. When she lays down or reclines the pain resolves. The pain is worse when she sits straight up. She has had this issue for about 3-4 months. She is status post laminectomy and fusion for tumor at T3 on 10/20/14  Past/Anticipated chemotherapy by medical oncology, if any:  Past and current Medical Oncology treatment:  (4) letrozole started 08/31/2014;              (a) palbociclib added Sept 2016 at 75 mg 21/7, with significant neutropenia; not repeated after first cycle   (5)  zolendronate started 09/20/2014, stopped after initial dose due to poor tolerance             (a) denosumab/ Xgeva started 01/31/2015, repeated every 4 weeks. Moved to every 8 weeks starting 07/17/15.  April Dilling NP documents 03/27/16 She remains on letrozole daily and Xgeva monthly. She is now having more pain from her prior surgery. We discussed the previous recommendation of gabapentin and she is agreeable to trying this. I have sent a prescription for gabapentin 300 mg at bedtime. I explained to the patient that we may need to increase this dose. I have encouraged her to discuss this medication with her neurosurgeon later today to see if he agrees with it   Pain on a scale of 0-10 is:  She reports pain over her Right  Scapula. She was started on gabapentin 300 mg at night by April Dilling NP on 03/27/16 and reports it has helped, but has not taken the pain away completely. Her pain returns in the afternoon.    If Spine Met(s), symptoms, if any, include:  Bowel/Bladder retention or incontinence (please describe): No  Numbness or weakness in extremities (please describe): No  Current Decadron regimen, if applicable: none  Ambulatory status? Walker? Wheelchair?: She is ambulatory.   SAFETY ISSUES: Prior radiation? r  Radiation 11/23/2014-12/11/2014.              (a) T1-T5 was treated to 30 Gy in 12 fractions at 2.5 Gy per fraction   She also received radiation to her Right Breast at [facility] in 1985.   Pacemaker/ICD? No  Possible current pregnancy? No  Is the patient on methotrexate? No  Current Complaints / other details:    April Rosales underwent RIGHT lumpectomy in 1985 at [facility] for what sounds like a stage I breast cancer. She tells me she had more than 30 lymph nodes removed from her right axilla and all of them were clear. She received  adjuvant radiation but no systemic treatment. She reports having MRI of neck /spine on 03/02/16 ordered by April Rosales.   BP (!) 148/79   Pulse 82   Temp 97.7 F (36.5 C)   Ht 5' 1.5" (1.562 m)   Wt 120 lb 6.4 oz (54.6 kg)   SpO2 100% Comment: room air  BMI 22.38 kg/m    Wt Readings from Last 3 Encounters:  04/11/16 120 lb 6.4 oz (54.6 kg)  03/27/16 121 lb 12.8 oz (55.2 kg)  03/21/16 120 lb 12.8 oz (54.8 kg)

## 2016-04-11 ENCOUNTER — Ambulatory Visit
Admission: RE | Admit: 2016-04-11 | Discharge: 2016-04-11 | Disposition: A | Discharge status: Home / Self Care | Payer: Medicare Other | Source: Ambulatory Visit | Attending: Radiation Oncology | Admitting: Radiation Oncology

## 2016-04-11 ENCOUNTER — Encounter: Payer: Self-pay | Admitting: Radiation Oncology

## 2016-04-11 VITALS — BP 148/79 | HR 82 | Temp 97.7°F | Ht 61.5 in | Wt 120.4 lb

## 2016-04-11 DIAGNOSIS — Z90710 Acquired absence of both cervix and uterus: Secondary | ICD-10-CM | POA: Diagnosis not present

## 2016-04-11 DIAGNOSIS — Z803 Family history of malignant neoplasm of breast: Secondary | ICD-10-CM | POA: Insufficient documentation

## 2016-04-11 DIAGNOSIS — Z853 Personal history of malignant neoplasm of breast: Secondary | ICD-10-CM | POA: Diagnosis not present

## 2016-04-11 DIAGNOSIS — C7951 Secondary malignant neoplasm of bone: Secondary | ICD-10-CM | POA: Insufficient documentation

## 2016-04-11 DIAGNOSIS — Z79899 Other long term (current) drug therapy: Secondary | ICD-10-CM | POA: Insufficient documentation

## 2016-04-11 DIAGNOSIS — Z87891 Personal history of nicotine dependence: Secondary | ICD-10-CM | POA: Insufficient documentation

## 2016-04-11 DIAGNOSIS — Z9889 Other specified postprocedural states: Secondary | ICD-10-CM | POA: Diagnosis not present

## 2016-04-11 DIAGNOSIS — Z923 Personal history of irradiation: Secondary | ICD-10-CM | POA: Insufficient documentation

## 2016-04-11 DIAGNOSIS — Z7982 Long term (current) use of aspirin: Secondary | ICD-10-CM | POA: Insufficient documentation

## 2016-04-11 MED ORDER — GABAPENTIN 100 MG PO CAPS: ORAL_CAPSULE | ORAL | 5 refills | Status: DC

## 2016-04-11 NOTE — Progress Notes (Addendum)
Radiation Oncology         [phone] ________________________________  Outpatient Re-Consultation  Name: April Rosales MRN: [id number]  Date: 04/11/2016  DOB: 14-Mar-1941  [id number] DAVID, MD  Jovita Gamma, MD   REFERRING PHYSICIAN: Jovita Gamma, MD  DIAGNOSIS:    ICD-9-CM ICD-10-CM   1. Bone metastases (HCC) 198.5 C79.51 gabapentin (NEURONTIN) 100 MG capsule  2. Metastasis to bone (HCC) 198.5 C79.51    Stage IV breast cancer with history of spinal metastases.   CHIEF COMPLAINT: Right scapular pain.  HISTORY OF PRESENT ILLNESS::April Rosales is a 75 y.o. female who presents with a diagnosis of stage V breast cancer with history of spinal metastases. The patient returns for reconsult today; she has a previous history of radiation to the right breast at [facility] in 1985. In July 2016 Dr. Pablo Ledger provided palliative radiation from T1-T5, adjuvantly to a dose of 30 Gy in 12 fractions. Patient has a history significant for kyphosis. She was recently presented at tumor board per Dr. Sherwood Gambler for complaints of pain in the right scapular region, beginning 3-4 months ago. The patient's most recent CT scan from September, of the chest, was reviewed. There was no clear explanation for right scapular pain. It was noted that at the level of T1 on the left, there was a screw from her spinal hardware that could theoretically be causing pain if it were in the left scapula. Patient saw Altamese Dilling on 03/27/16. She remains on Letrozole and Xgeva. She started Gabapentin 300 mg at bedtime for her pain. The patient reports that this has helped somewhat. The patient's pain radiates to the upper right back, mid scapula; it resolves when she lays down. She has had the pain for about 4 months. Previously she has been prescribed Relafen by her neurosurgeon; she reports it did not make much difference and she discontinued use. She has expressed curiosity about her options for taking  Gabapentin more frequently to manage her pain.  PREVIOUS RADIATION THERAPY: Yes  1985 : Radiation to the right breast  July 2016 : Palliative radiation from T1-T5 to a dose of 30 Gy in 12 fractions  PAST MEDICAL HISTORY:  has a past medical history of Arthritis; Breast cancer ([place]) (1985/2016); Chronic back pain; Family history of ovarian cancer; Osteoporosis; and Radiation (11/23/14-12/11/14).    PAST SURGICAL HISTORY: Past Surgical History:  Procedure Laterality Date  . ABDOMINAL HYSTERECTOMY     1980  . APPENDECTOMY  1977  . APPLICATION OF INTRAOPERATIVE CT SCAN N/A 10/20/2014   Procedure: APPLICATION OF INTRAOPERATIVE CAT SCAN;  Surgeon: [name], MD;  Location: [place] [facility];  Service: Neurosurgery;  Laterality: N/A;  . BREAST SURGERY Right 1985  . THYROID CYST EXCISION  1967    FAMILY HISTORY: family history includes Breast cancer in her paternal aunt; Diabetes in her father; Heart disease in her father and mother; Huntington's disease in her other; Hypertension in her mother; Ovarian cancer in her paternal aunt; Prostate cancer in her paternal uncle; Stroke in her paternal grandfather.  SOCIAL HISTORY:  reports that she has quit smoking. Her smoking use included Cigarettes. She has a 5.00 pack-year smoking history. She has quit using smokeless tobacco. She reports that she drinks alcohol. She reports that she does not use drugs.  ALLERGIES: Ativan [lorazepam]; Dilaudid [hydromorphone hcl]; and Haldol [haloperidol lactate]  MEDICATIONS:  Current Outpatient Prescriptions  Medication Sig Dispense Refill  . Ascorbic Acid (VITAMIN C PO) Take 1 tablet by mouth every other day.  Takes qod    . aspirin EC 325 MG tablet Take 1 tablet (325 mg total) by mouth daily. 30 tablet 0  . Cholecalciferol (VITAMIN D3 ADULT GUMMIES PO) Take 4,000 Units by mouth daily.    . Cyanocobalamin (VITAMIN B-12 PO) Take by mouth daily. Takes every other day    . gabapentin (NEURONTIN) 300 MG capsule Take 1  capsule (300 mg total) by mouth at bedtime. 30 capsule 1  . Iron-Vitamins (GERITOL PO) Take by mouth daily.    Marland Kitchen letrozole (FEMARA) 2.5 MG tablet TAKE 1 TABLET(2.5 MG) BY MOUTH DAILY 90 tablet 0  . calcium carbonate (TUMS) 500 MG chewable tablet Chew 2 tablets by mouth 2 (two) times daily.     Marland Kitchen gabapentin (NEURONTIN) 100 MG capsule Continue taking your 300mg  tab at night; take 100-300mg  once during the daytime as needed for pain. 90 capsule 5  . nabumetone (RELAFEN) 500 MG tablet TK 1 T PO BID  3   No current facility-administered medications for this encounter.     REVIEW OF SYSTEMS:  Notable for that above.   PHYSICAL EXAM:  height is 5' 1.5" (1.562 m) and weight is 120 lb 6.4 oz (54.6 kg). Her temperature is 97.7 F (36.5 C). Her blood pressure is 148/79 (abnormal) and her pulse is 82. Her oxygen saturation is 100%.   General: Alert and oriented, in no acute distress HEENT: Head is normocephalic. Extraocular movements are intact. Oral cavity is clear. Neck: Neck is supple, no palpable cervical or supraclavicular masses. Anterior scar over lower neck at midline consistent with prior surgery. Heart: Regular in rate and rhythm with no murmurs, rubs, or gallops. Chest: Clear to auscultation bilaterally, with no rhonchi, wheezes, or rales. Back: Vertical scar consistent with prior surgery; no skin lesions, pain localized around mid to upper scapula. When I put my hand against her right scapula the coolness of my hand makes the pain feel better. Abdomen: Soft, nontender, nondistended, with no rigidity or guarding. Extremities: No edema. Sensation is intact in all extremities.  Lymphatics: see Neck Exam Skin: No concerning lesions. Musculoskeletal: Hand strength is excellent bilaterally. Strength is symmetric in arms and legs. Neurologic: Cranial nerves II through XII are grossly intact. No obvious focalities. Speech is fluent. Coordination is intact. Psychiatric: Judgment and insight are  intact. Affect is appropriate.   ECOG = 1  0 - Asymptomatic (Fully active, able to carry on all predisease activities without restriction)  1 - Symptomatic but completely ambulatory (Restricted in physically strenuous activity but ambulatory and able to carry out work of a light or sedentary nature. For example, light housework, office work)  2 - Symptomatic, <50% in bed during the day (Ambulatory and capable of all self care but unable to carry out any work activities. Up and about more than 50% of waking hours)  3 - Symptomatic, >50% in bed, but not bedbound (Capable of only limited self-care, confined to bed or chair 50% or more of waking hours)  4 - Bedbound (Completely disabled. Cannot carry on any self-care. Totally confined to bed or chair)  5 - Death   Eustace Pen MM, Creech RH, Tormey DC, et al. [phone]). "Toxicity and response criteria of the [facility]". [place] Oncol. 5 (6): 649-55   LABORATORY DATA:  Lab Results  Component Value Date   WBC 5.2 03/25/2016   HGB 15.2 03/25/2016   HCT 44.3 03/25/2016   MCV 89.1 03/25/2016   PLT 220 03/25/2016   CMP  Component Value Date/Time   NA 142 03/25/2016 1113   K 3.9 03/25/2016 1113   CL 103 10/13/2014 1427   CO2 24 03/25/2016 1113   GLUCOSE 78 03/25/2016 1113   BUN 16.4 03/25/2016 1113   CREATININE 0.7 03/25/2016 1113   CALCIUM 10.3 03/25/2016 1113   PROT 7.7 03/25/2016 1113   ALBUMIN 3.9 03/25/2016 1113   AST 20 03/25/2016 1113   ALT 15 03/25/2016 1113   ALKPHOS 45 03/25/2016 1113   BILITOT 0.31 03/25/2016 1113   GFRNONAA >60 10/13/2014 1427   GFRNONAA >89 08/21/2014 1713   GFRAA >60 10/13/2014 1427   GFRAA >89 08/21/2014 1713         RADIOGRAPHY: No results found.    IMPRESSION/PLAN: Metastatic breast cancer, right upper back pain. The patient doesn't seem clearly related to cancer progression or radiation effects.  I think it stands to improve with supportive care : PT and  medication.  It was a pleasure meeting the patient today. For management of the patient's pain, I recommend she try taking another dose of Gabapentin in the afternoon and see if she tolerates it. Alternatively, the patient may try physical therapy. We discussed the benefits that PT may offer her in improving her quality of life. The patient would like to pursue PT and more medication; we will refer her. I will update Dr. Sherwood Gambler and Dr. Jana Hakim on our visit and the patient's plan to participate in PT.  We discussed multiple services offered by PT. She is enthusiastic.  I will prescribe Gabapentin 100 mg capsules for the patient; I instructed her to continue taking her 300 mg tab at night, and take 100-300 mg once during the daytime as needed for pain.  Medical Oncology will continue to follow with this patient. She will not need to return to Radiation Oncology unless otherwise indicated. I will be available for questions or concerns as needed.  __________________________________________   Eppie Gibson, MD  This document serves as a record of services personally performed by Eppie Gibson, MD. It was created on her behalf by Maryla Morrow, a trained medical scribe. The creation of this record is based on the scribe's personal observations and the provider's statements to them. This document has been checked and approved by the attending provider.

## 2016-04-22 ENCOUNTER — Encounter: Payer: Self-pay | Admitting: Physical Therapy

## 2016-04-22 ENCOUNTER — Other Ambulatory Visit (HOSPITAL_BASED_OUTPATIENT_CLINIC_OR_DEPARTMENT_OTHER): Payer: Medicare Other

## 2016-04-22 ENCOUNTER — Ambulatory Visit: Payer: Medicare Other | Attending: Radiation Oncology | Admitting: Physical Therapy

## 2016-04-22 ENCOUNTER — Other Ambulatory Visit: Payer: Self-pay | Admitting: Oncology

## 2016-04-22 ENCOUNTER — Ambulatory Visit (HOSPITAL_BASED_OUTPATIENT_CLINIC_OR_DEPARTMENT_OTHER): Payer: Medicare Other

## 2016-04-22 VITALS — BP 160/67 | HR 77 | Temp 98.0°F | Resp 20

## 2016-04-22 DIAGNOSIS — C7951 Secondary malignant neoplasm of bone: Secondary | ICD-10-CM

## 2016-04-22 DIAGNOSIS — C50412 Malignant neoplasm of upper-outer quadrant of left female breast: Secondary | ICD-10-CM | POA: Diagnosis not present

## 2016-04-22 DIAGNOSIS — C50912 Malignant neoplasm of unspecified site of left female breast: Secondary | ICD-10-CM | POA: Diagnosis not present

## 2016-04-22 DIAGNOSIS — C50919 Malignant neoplasm of unspecified site of unspecified female breast: Secondary | ICD-10-CM

## 2016-04-22 DIAGNOSIS — M25612 Stiffness of left shoulder, not elsewhere classified: Secondary | ICD-10-CM

## 2016-04-22 DIAGNOSIS — R293 Abnormal posture: Secondary | ICD-10-CM | POA: Diagnosis not present

## 2016-04-22 DIAGNOSIS — M25611 Stiffness of right shoulder, not elsewhere classified: Secondary | ICD-10-CM | POA: Insufficient documentation

## 2016-04-22 DIAGNOSIS — C751 Malignant neoplasm of pituitary gland: Secondary | ICD-10-CM

## 2016-04-22 DIAGNOSIS — M6281 Muscle weakness (generalized): Secondary | ICD-10-CM

## 2016-04-22 DIAGNOSIS — M810 Age-related osteoporosis without current pathological fracture: Secondary | ICD-10-CM

## 2016-04-22 LAB — COMPREHENSIVE METABOLIC PANEL
ALK PHOS: 44 U/L (ref 40–150)
ALT: 17 U/L (ref 0–55)
AST: 23 U/L (ref 5–34)
Albumin: 3.7 g/dL (ref 3.5–5.0)
Anion Gap: 8 mEq/L (ref 3–11)
BILIRUBIN TOTAL: 0.42 mg/dL (ref 0.20–1.20)
BUN: 17.4 mg/dL (ref 7.0–26.0)
CALCIUM: 10.8 mg/dL — AB (ref 8.4–10.4)
CO2: 29 mEq/L (ref 22–29)
Chloride: 105 mEq/L (ref 98–109)
Creatinine: 0.7 mg/dL (ref 0.6–1.1)
EGFR: 85 mL/min/{1.73_m2} — AB (ref >90)
Glucose: 92 mg/dl (ref 70–140)
POTASSIUM: 4.5 meq/L (ref 3.5–5.1)
Sodium: 141 mEq/L (ref 136–145)
TOTAL PROTEIN: 7.3 g/dL (ref 6.4–8.3)

## 2016-04-22 LAB — CBC WITH DIFFERENTIAL/PLATELET
BASO%: 1 % (ref 0.0–2.0)
BASOS ABS: 0.1 10*3/uL (ref 0.0–0.1)
EOS ABS: 0.1 10*3/uL (ref 0.0–0.5)
EOS%: 2.9 % (ref 0.0–7.0)
HEMATOCRIT: 42.9 % (ref 34.8–46.6)
HEMOGLOBIN: 14.8 g/dL (ref 11.6–15.9)
LYMPH#: 1 10*3/uL (ref 0.9–3.3)
LYMPH%: 21.5 % (ref 14.0–49.7)
MCH: 30.8 pg (ref 25.1–34.0)
MCHC: 34.5 g/dL (ref 31.5–36.0)
MCV: 89.4 fL (ref 79.5–101.0)
MONO#: 0.4 10*3/uL (ref 0.1–0.9)
MONO%: 8.4 % (ref 0.0–14.0)
NEUT#: 3.2 10*3/uL (ref 1.5–6.5)
NEUT%: 66.2 % (ref 38.4–76.8)
Platelets: 212 10*3/uL (ref 145–400)
RBC: 4.8 10*6/uL (ref 3.70–5.45)
RDW: 14.3 % (ref 11.2–14.5)
WBC: 4.8 10*3/uL (ref 3.9–10.3)

## 2016-04-22 MED ORDER — DENOSUMAB 120 MG/1.7ML ~~LOC~~ SOLN
120.0000 mg | Freq: Once | SUBCUTANEOUS | Status: AC
  Administered 2016-04-22: 120 mg via SUBCUTANEOUS
  Filled 2016-04-22: qty 1.7

## 2016-04-22 NOTE — Therapy (Signed)
[place], [place], [address] Phone: [phone]   Fax:  [phone]  Physical Therapy Evaluation  Patient Details  Name: LENAYAH LANTZER MRN: [id number] Date of Birth: 30-Dec-1940 Referring Provider: Dr. Isidore Moos  Encounter Date: 04/22/2016      PT End of Session - 04/22/16 1609    Visit Number 1   Number of Visits 9   Date for PT Re-Evaluation 05/20/16   PT Start Time [id number]   PT Stop Time 1605   PT Time Calculation (min) 50 min   Activity Tolerance Patient tolerated treatment well   Behavior During Therapy [facility] for tasks assessed/performed      Past Medical History:  Diagnosis Date  . Arthritis   . Breast cancer [facility]) 1985/2016   takes [name] daily  . Chronic back pain    stenosis/listhesis  . Family history of ovarian cancer   . Osteoporosis    takes Vit D  . Radiation 11/23/14-12/11/14   30 Gy T1-T5     Past Surgical History:  Procedure Laterality Date  . ABDOMINAL HYSTERECTOMY     1980  . APPENDECTOMY  1977  . APPLICATION OF INTRAOPERATIVE CT SCAN N/A 10/20/2014   Procedure: APPLICATION OF INTRAOPERATIVE CAT SCAN;  Surgeon: [name], MD;  Location: [place] [facility];  Service: Neurosurgery;  Laterality: N/A;  . BREAST SURGERY Right 1985  . THYROID CYST EXCISION  1967    There were no vitals filed for this visit.       Subjective Assessment - 04/22/16 1523    Subjective When I sit straight up it bothers me but when I sit at home in a reclined position it doesn't bother me. I think some of this metal is hitting a nerve somewhere. I started having pain in my right shoulder in March. The doctor gave me a prescription but it didn't help. The pain kept moving down to where it is now. I was prescribed gaba pentin and I have been taking 300 mg at night. It has helped but it has not cured it.    Pertinent History 1985- pt underwent right lumpectomy with 30 lymph nodes removed, 2016-left  breast cancer with bony mets, pt had surgery on her back to remove a tumor in the bone in 2016, T1-3 laminectomy and posterior C7-T5 fixation, completed radiation   How long can you sit comfortably? in upright position can sit indefinately but when she stands is when she has pain   Patient Stated Goals to stop the pain   Currently in Pain? No/denies   Pain Score 0-No pain            [facility] PT Assessment - 04/22/16 0001      Assessment   Medical Diagnosis stage IV breast cancer   Referring Provider Dr. Isidore Moos   Onset Date/Surgical Date 10/20/14   Hand Dominance Right   Prior Therapy 1 PT visit last year      Precautions   Precautions Other (comment)  cancer     Restrictions   Weight Bearing Restrictions No     Balance Screen   Has the patient fallen in the past 6 months No   Has the patient had a decrease in activity level because of a fear of falling?  No   Is the patient reluctant to leave their home because of a fear of falling?  No     Home Social worker Private residence   Living Arrangements Alone  Available Help at Discharge Family   Type of [place] to enter   Entrance Stairs-Number of Steps 2   Entrance Stairs-Rails Can reach both   Home Layout One level     Prior Function   Level of Independence Independent     Cognition   Overall Cognitive Status Within Functional Limits for tasks assessed     Observation/Other Assessments   Observations thoracic kyphosis with surgical scar in central upper back      Sensation   Light Touch --     Coordination   Gross Motor Movements are Fluid and Coordinated --     Posture/Postural Control   Posture/Postural Control Postural limitations   Postural Limitations Rounded Shoulders;Forward head;Increased thoracic kyphosis     AROM   Right Shoulder Flexion 118 Degrees   Right Shoulder ABduction 118 Degrees   Right Shoulder Internal Rotation 65 Degrees   Right Shoulder  External Rotation 73 Degrees   Left Shoulder Flexion 140 Degrees   Left Shoulder ABduction 110 Degrees   Left Shoulder Internal Rotation 90 Degrees   Left Shoulder External Rotation 78 Degrees              Quick Dash - 04/22/16 0001    Open a tight or new jar No difficulty   Do heavy household chores (wash walls, wash floors) Moderate difficulty   Carry a shopping bag or briefcase No difficulty   Wash your back No difficulty   Use a knife to cut food No difficulty   Recreational activities in which you take some force or impact through your arm, shoulder, or hand (golf, hammering, tennis) Unable   During the past week, to what extent has your arm, shoulder or hand problem interfered with your normal social activities with family, friends, neighbors, or groups? Slightly   During the past week, to what extent has your arm, shoulder or hand problem limited your work or other regular daily activities Not at all   Arm, shoulder, or hand pain. Mild   Tingling (pins and needles) in your arm, shoulder, or hand Mild   Difficulty Sleeping No difficulty   DASH Score 20.45 %                     PT Education - 04/22/16 1608    Education provided Yes   Education Details meeks decompression, possible origins of pain and role of posture in pain   Person(s) Educated Patient   Methods Explanation;Handout   Comprehension Verbalized understanding                [place] - 04/22/16 1617      CC Long Term Goal  #1   Title Patient will be independent in a home exercise program for continued strengthening and stretching   Time 4   Period Weeks   Status New     CC Long Term Goal  #2   Title Patient will report a 50% improvement in right scapular pain to allow improved comfort   Time 4   Period Weeks   Status New     CC Long Term Goal  #3   Title Pt will demonstrate improved right shoulder flexion ROM to 140 degrees to allow pt to reach items on shelf    Baseline 118   Time 4   Period Weeks   Status New            Plan -  04/22/16 1613    Clinical Impression Statement Patient arrives to PT for right scapular pain folloiwng spinal kyphoplasty and fixation of C7-T5. Patient has a hx of right and possibly left breast cancer with bony mets. The spinal surgery was to remove a malignant tumor on her spine. She has been having increased right scapular pain since May that began in her right shoulder and moved to her right scapular region. The pain is worsened when pt stands up after sitting for a period of time. The most comfortable position for her is lying in a reclined position. Patient would benefit from decompression exercises and postural exercises to help reduce pain and strengthening and ROM exercises for RUE. She has increased kyphosis. She would benefit from core exercises to help with posture. This evaluation is of moderate complexity due to the prescense of bony mets and history of spinal surgery. Her condition is currently evolving.    Rehab Potential Good   Clinical Impairments Affecting Rehab Potential hx of radiation, T1-3 laminectomy and posterior C7-T5 fixation   PT Frequency 2x / week   PT Duration 4 weeks   PT Treatment/Interventions Therapeutic exercise;ADLs/Self Care Home Management;Passive range of motion;Manual techniques;Patient/family education   PT Next Visit Plan do meeks decompression exercises, postural exercises   PT Home Exercise Plan meeks decompression exercises   Consulted and Agree with Plan of Care Patient      Patient will benefit from skilled therapeutic intervention in order to improve the following deficits and impairments:  Decreased range of motion, Decreased strength, Impaired UE functional use, Postural dysfunction, Pain  Visit Diagnosis: Posture abnormality - Plan: PT plan of care cert/re-cert  Muscle weakness (generalized) - Plan: PT plan of care cert/re-cert  Stiffness of right shoulder, not  elsewhere classified - Plan: PT plan of care cert/re-cert  Stiffness of left shoulder, not elsewhere classified - Plan: PT plan of care cert/re-cert      G-Codes - [id number]    Functional Assessment Tool Used quick dash    Functional Limitation Carrying, moving and handling objects   Carrying, Moving and Handling Objects Current Status [id number]) At least 20 percent but less than 40 percent impaired, limited or restricted   Carrying, Moving and Handling Objects Goal Status [id number]) At least 1 percent but less than 20 percent impaired, limited or restricted       Problem List Patient Active Problem List   Diagnosis Date Noted  . Neuropathy ([place]) 03/27/2016  . Breast cancer of upper-outer quadrant of left female breast ([place]) 09/11/2015  . BMI  21.95 03/14/2015  . Metastasis to bone ([place]) 10/20/2014  . Genetic testing 10/19/2014  . Family history of breast cancer   . Family history of ovarian cancer   . Breast cancer metastasized to multiple sites ([place]) 08/28/2014  . 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 Medication management 07/07/2013  . Labile hypertension   . Hyperlipidemia   . Prediabetes   . Vitamin D deficiency   . Osteoporosis   . IBS (irritable bowel syndrome)     Alexia Freestone 04/22/2016, 4:35 PM  [place], [place], [address] Phone: [phone]   Fax:  [phone]  Name: JEANN INCE MRN: [id number] Date of Birth: 1941-02-13  Allyson Sabal, PT 04/22/16 4:35 PM

## 2016-04-22 NOTE — Patient Instructions (Signed)
Denosumab injection What is this medicine? DENOSUMAB (den oh sue mab) slows bone breakdown. Prolia is used to treat osteoporosis in women after menopause and in men. Xgeva is used to prevent bone fractures and other bone problems caused by cancer bone metastases. Xgeva is also used to treat giant cell tumor of the bone. This medicine may be used for other purposes; ask your health care provider or pharmacist if you have questions. COMMON BRAND NAME(S): Prolia, XGEVA What should I tell my health care provider before I take this medicine? They need to know if you have any of these conditions: -dental disease -eczema -infection or history of infections -kidney disease or on dialysis -low blood calcium or vitamin D -malabsorption syndrome -scheduled to have surgery or tooth extraction -taking medicine that contains denosumab -thyroid or parathyroid disease -an unusual reaction to denosumab, other medicines, foods, dyes, or preservatives -pregnant or trying to get pregnant -breast-feeding How should I use this medicine? This medicine is for injection under the skin. It is given by a health care professional in a hospital or clinic setting. If you are getting Prolia, a special MedGuide will be given to you by the pharmacist with each prescription and refill. Be sure to read this information carefully each time. For Prolia, talk to your pediatrician regarding the use of this medicine in children. Special care may be needed. For Xgeva, talk to your pediatrician regarding the use of this medicine in children. While this drug may be prescribed for children as young as 13 years for selected conditions, precautions do apply. Overdosage: If you think you've taken too much of this medicine contact a poison control center or emergency room at once. Overdosage: If you think you have taken too much of this medicine contact a poison control center or emergency room at once. NOTE: This medicine is only for  you. Do not share this medicine with others. What if I miss a dose? It is important not to miss your dose. Call your doctor or health care professional if you are unable to keep an appointment. What may interact with this medicine? Do not take this medicine with any of the following medications: -other medicines containing denosumab This medicine may also interact with the following medications: -medicines that suppress the immune system -medicines that treat cancer -steroid medicines like prednisone or cortisone This list may not describe all possible interactions. Give your health care provider a list of all the medicines, herbs, non-prescription drugs, or dietary supplements you use. Also tell them if you smoke, drink alcohol, or use illegal drugs. Some items may interact with your medicine. What should I watch for while using this medicine? Visit your doctor or health care professional for regular checks on your progress. Your doctor or health care professional may order blood tests and other tests to see how you are doing. Call your doctor or health care professional if you get a cold or other infection while receiving this medicine. Do not treat yourself. This medicine may decrease your body's ability to fight infection. You should make sure you get enough calcium and vitamin D while you are taking this medicine, unless your doctor tells you not to. Discuss the foods you eat and the vitamins you take with your health care professional. See your dentist regularly. Brush and floss your teeth as directed. Before you have any dental work done, tell your dentist you are receiving this medicine. Do not become pregnant while taking this medicine or for 5 months after stopping   it. Women should inform their doctor if they wish to become pregnant or think they might be pregnant. There is a potential for serious side effects to an unborn child. Talk to your health care professional or pharmacist for more  information. What side effects may I notice from receiving this medicine? Side effects that you should report to your doctor or health care professional as soon as possible: -allergic reactions like skin rash, itching or hives, swelling of the face, lips, or tongue -breathing problems -chest pain -fast, irregular heartbeat -feeling faint or lightheaded, falls -fever, chills, or any other sign of infection -muscle spasms, tightening, or twitches -numbness or tingling -skin blisters or bumps, or is dry, peels, or red -slow healing or unexplained pain in the mouth or jaw -unusual bleeding or bruising Side effects that usually do not require medical attention (Report these to your doctor or health care professional if they continue or are bothersome.): -muscle pain -stomach upset, gas This list may not describe all possible side effects. Call your doctor for medical advice about side effects. You may report side effects to FDA at 1-800-FDA-1088. Where should I keep my medicine? This medicine is only given in a clinic, doctor's office, or other health care setting and will not be stored at home. NOTE: This sheet is a summary. It may not cover all possible information. If you have questions about this medicine, talk to your doctor, pharmacist, or health care provider.  2015, Elsevier/Gold Standard. (2011-11-10 12:37:47)  

## 2016-04-23 ENCOUNTER — Ambulatory Visit: Payer: Medicare Other

## 2016-04-23 DIAGNOSIS — M25612 Stiffness of left shoulder, not elsewhere classified: Secondary | ICD-10-CM

## 2016-04-23 DIAGNOSIS — M25611 Stiffness of right shoulder, not elsewhere classified: Secondary | ICD-10-CM

## 2016-04-23 DIAGNOSIS — M6281 Muscle weakness (generalized): Secondary | ICD-10-CM | POA: Diagnosis not present

## 2016-04-23 DIAGNOSIS — R293 Abnormal posture: Secondary | ICD-10-CM

## 2016-04-23 LAB — CANCER ANTIGEN 27.29: CA 27.29: 27.3 U/mL (ref 0.0–38.6)

## 2016-04-23 NOTE — Patient Instructions (Addendum)
Over Head Pull: Narrow and Wide Grip     Cancer Rehab [phone]   On back, knees bent, feet flat, band across thighs, elbows straight but relaxed. Pull hands apart (start). Keeping elbows straight, bring arms up and over head, hands toward floor. Keep pull steady on band. Hold momentarily. Return slowly, keeping pull steady, back to start. Then do with a Wider Grip.  Repeat _10__ times.   Side Pull: Double Arm   On back, knees bent, feet flat. Arms perpendicular to body, shoulder level, elbows straight but relaxed. Pull arms out to sides, elbows straight. Resistance band comes across collarbones, hands toward floor. Hold momentarily. Slowly return to starting position. Repeat _10__ times.   Sword   On back, knees bent, feet flat, left hand on left hip, right hand above left. Pull right arm DIAGONALLY (hip to shoulder) across chest. Bring right arm along head toward floor. Hold momentarily. Slowly return to starting position. Repeat _10__ times. Do with left arm.   Shoulder Rotation: Double Arm   On back, knees bent, feet flat, elbows tucked at sides, bent 90, hands palms up. Pull hands apart and down toward floor, keeping elbows near sides. Hold momentarily. Slowly return to starting position. Repeat _10__ times.

## 2016-04-23 NOTE — Therapy (Signed)
[place], [place], [address] Phone: [phone]   Fax:  [phone]  Physical Therapy Treatment  Patient Details  Name: April Rosales MRN: [id number] Date of Birth: Jan 11, 1941 Referring Provider: Dr. Isidore Moos  Encounter Date: 04/23/2016      PT End of Session - 04/23/16 1413    Visit Number 2   Number of Visits 9   Date for PT Re-Evaluation 05/20/16   PT Start Time 8786   PT Stop Time 1433   PT Time Calculation (min) 40 min   Activity Tolerance Patient tolerated treatment well   Behavior During Therapy [facility] for tasks assessed/performed      Past Medical History:  Diagnosis Date  . Arthritis   . Breast cancer [facility]) 1985/2016   takes [name] daily  . Chronic back pain    stenosis/listhesis  . Family history of ovarian cancer   . Osteoporosis    takes Vit D  . Radiation 11/23/14-12/11/14   30 Gy T1-T5     Past Surgical History:  Procedure Laterality Date  . ABDOMINAL HYSTERECTOMY     1980  . APPENDECTOMY  1977  . APPLICATION OF INTRAOPERATIVE CT SCAN N/A 10/20/2014   Procedure: APPLICATION OF INTRAOPERATIVE CAT SCAN;  Surgeon: [name], MD;  Location: [place] NEURO ORS;  Service: Neurosurgery;  Laterality: N/A;  . BREAST SURGERY Right 1985  . THYROID CYST EXCISION  1967    There were no vitals filed for this visit.      Subjective Assessment - 04/23/16 1357    Subjective Sitting for the evaluation yesterday really made my back hurt, just gave me a muscle spasm. But the exercises on my back we did yesterday felt good. Laying on my back right now I'm not having any pain.    Pertinent History 1985- pt underwent right lumpectomy with 30 lymph nodes removed, 2016-left breast cancer with bony mets, pt had surgery on her back to remove a tumor in the bone in 2016, T1-3 laminectomy and posterior C7-T5 fixation, completed radiation   How long can you sit comfortably? in upright position can sit  indefinately but when she stands is when she has pain   Patient Stated Goals to stop the pain   Currently in Pain? No/denies                         [facility] Adult PT Treatment/Exercise - 04/23/16 0001      Lumbar Exercises: Supine   Other Supine Lumbar Exercises Meeks Decompression exercises 5 times for 5 seconds for all. VC to remind pt of technique.      Shoulder Exercises: Supine   Horizontal ABduction Strengthening;Both;10 reps;Theraband   Theraband Level (Shoulder Horizontal ABduction) Level 1 (Yellow)   External Rotation Strengthening;Both;10 reps;Theraband   Theraband Level (Shoulder External Rotation) Level 1 (Yellow)   Flexion Strengthening;Both;10 reps;Theraband  Narrow and wide grip 10 times each   Theraband Level (Shoulder Flexion) Level 1 (Yellow)   Other Supine Exercises Bil D2 with yellow theraband 10 times each UE returning therapist demonstration.                PT Education - 04/23/16 1412    Education provided Yes   Education Details Supine scapular series   Person(s) Educated Patient   Methods Explanation;Demonstration;Handout   Comprehension Verbalized understanding;Returned demonstration;Need further instruction                [facility]  Goals - 04-25-2016 1617      CC Long Term Goal  #1   Title Patient will be independent in a home exercise program for continued strengthening and stretching   Time 4   Period Weeks   Status New     CC Long Term Goal  #2   Title Patient will report a 50% improvement in right scapular pain to allow improved comfort   Time 4   Period Weeks   Status New     CC Long Term Goal  #3   Title Pt will demonstrate improved right shoulder flexion ROM to 140 degrees to allow pt to reach items on shelf   Baseline 118   Time 4   Period Weeks   Status New            Plan - 04/23/16 1414    Clinical Impression Statement Pt did well with review of Meeks Decompression exercises and  demonstrated good technique after instruction of supine scapular series (with no resistance sent home due to bony met at T3) so added this to HEP.    Rehab Potential Good   Clinical Impairments Affecting Rehab Potential hx of radiation, T1-3 laminectomy and posterior C7-T5 fixation   PT Frequency 2x / week   PT Duration 4 weeks   PT Treatment/Interventions Therapeutic exercise;ADLs/Self Care Home Management;Passive range of motion;Manual techniques;Patient/family education   PT Next Visit Plan Review HEP without resistance due to bony mets and add posterior pelvic tilts in supine. Cont Rt UE P/ROM to tolerance.   PT Home Exercise Plan meeks decompression exercises; supine scapular series without resistance   Consulted and Agree with Plan of Care Patient      Patient will benefit from skilled therapeutic intervention in order to improve the following deficits and impairments:  Decreased range of motion, Decreased strength, Impaired UE functional use, Postural dysfunction, Pain  Visit Diagnosis: Posture abnormality  Muscle weakness (generalized)  Stiffness of right shoulder, not elsewhere classified  Stiffness of left shoulder, not elsewhere classified       G-Codes - April 25, 2016 1628    Functional Assessment Tool Used quick dash    Functional Limitation Carrying, moving and handling objects   Carrying, Moving and Handling Objects Current Status (X4128) At least 20 percent but less than 40 percent impaired, limited or restricted   Carrying, Moving and Handling Objects Goal Status (N8676) At least 1 percent but less than 20 percent impaired, limited or restricted      Problem List Patient Active Problem List   Diagnosis Date Noted  . Neuropathy ([place]) 03/27/2016  . Breast cancer of upper-outer quadrant of left female breast ([place]) 09/11/2015  . BMI  21.95 03/14/2015  . Metastasis to bone ([place]) 10/20/2014  . Genetic testing 10/19/2014  . Family history of breast cancer   . Family  history of ovarian cancer   . Breast cancer metastasized to multiple sites ([place]) 08/28/2014  . 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 Medication management 07/07/2013  . Labile hypertension   . Hyperlipidemia   . Prediabetes   . Vitamin D deficiency   . Osteoporosis   . IBS (irritable bowel syndrome)     Otelia Limes, PTA 04/23/2016, 3:21 PM  [place], [place], [address] Phone: [phone]   Fax:  [phone]  Name: MALISSA SLAY MRN: [id number] Date of Birth: Jan 17, 1941

## 2016-04-29 ENCOUNTER — Encounter: Payer: Self-pay | Admitting: Physical Therapy

## 2016-04-29 ENCOUNTER — Ambulatory Visit: Payer: Medicare Other | Attending: Radiation Oncology | Admitting: Physical Therapy

## 2016-04-29 DIAGNOSIS — M6281 Muscle weakness (generalized): Secondary | ICD-10-CM | POA: Insufficient documentation

## 2016-04-29 DIAGNOSIS — M25612 Stiffness of left shoulder, not elsewhere classified: Secondary | ICD-10-CM | POA: Diagnosis not present

## 2016-04-29 DIAGNOSIS — M25611 Stiffness of right shoulder, not elsewhere classified: Secondary | ICD-10-CM | POA: Insufficient documentation

## 2016-04-29 DIAGNOSIS — R293 Abnormal posture: Secondary | ICD-10-CM

## 2016-04-29 NOTE — Therapy (Signed)
[place], [place], [address] Phone: [phone]   Fax:  [phone]  Physical Therapy Treatment  Patient Details  Name: April Rosales MRN: [id number] Date of Birth: 05-May-1941 Referring Provider: Dr. Isidore Moos  Encounter Date: 04/29/2016      PT End of Session - 04/29/16 1605    Visit Number 3   Number of Visits 9   Date for PT Re-Evaluation 05/20/16   PT Start Time 1522   PT Stop Time 1605   PT Time Calculation (min) 43 min   Activity Tolerance Patient tolerated treatment well   Behavior During Therapy [facility] for tasks assessed/performed      Past Medical History:  Diagnosis Date  . Arthritis   . Breast cancer [facility]) 1985/2016   takes [name] daily  . Chronic back pain    stenosis/listhesis  . Family history of ovarian cancer   . Osteoporosis    takes Vit D  . Radiation 11/23/14-12/11/14   30 Gy T1-T5     Past Surgical History:  Procedure Laterality Date  . ABDOMINAL HYSTERECTOMY     1980  . APPENDECTOMY  1977  . APPLICATION OF INTRAOPERATIVE CT SCAN N/A 10/20/2014   Procedure: APPLICATION OF INTRAOPERATIVE CAT SCAN;  Surgeon: [name], MD;  Location: [place] NEURO ORS;  Service: Neurosurgery;  Laterality: N/A;  . BREAST SURGERY Right 1985  . THYROID CYST EXCISION  1967    There were no vitals filed for this visit.      Subjective Assessment - 04/29/16 1524    Subjective I had another time with my back on Sunday. I have been doing my exercises that she gave me but I didn't do them Sunday because of my back pain.    Pertinent History 1985- pt underwent right lumpectomy with 30 lymph nodes removed, 2016-left breast cancer with bony mets, pt had surgery on her back to remove a tumor in the bone in 2016, T1-3 laminectomy and posterior C7-T5 fixation, completed radiation   How long can you sit comfortably? in upright position can sit indefinately but when she stands is when she has pain   Patient Stated Goals to stop the pain   Currently in Pain? Yes   Pain Score 2    Pain Location Back   Pain Orientation Mid;Upper   Pain Descriptors / Indicators Sharp   Pain Type Chronic pain   Pain Frequency Intermittent                         [facility] Adult PT Treatment/Exercise - 04/29/16 0001      Lumbar Exercises: Supine   Other Supine Lumbar Exercises Meeks Decompression exercises 5 times for 3 seconds for all. VC to remind pt of technique.      Shoulder Exercises: Supine   Horizontal ABduction Both;10 reps;AROM   External Rotation Both;10 reps;AROM   Flexion Both;10 reps;AROM  Narrow and wide grip 10 times each   Other Supine Exercises Bil D2 10 times each UE      Manual Therapy   Manual Therapy Passive ROM   Manual therapy comments B shoulders into flexion, abduction and ER to pt's tolerance                        [facility] Goals - 04/22/16 1617      CC Long Term Goal  #1   Title Patient will be independent in a  home exercise program for continued strengthening and stretching   Time 4   Period Weeks   Status New     CC Long Term Goal  #2   Title Patient will report a 50% improvement in right scapular pain to allow improved comfort   Time 4   Period Weeks   Status New     CC Long Term Goal  #3   Title Pt will demonstrate improved right shoulder flexion ROM to 140 degrees to allow pt to reach items on shelf   Baseline 118   Time 4   Period Weeks   Status New            Plan - 04/29/16 1606    Clinical Impression Statement Re instructed pt in Meeks Decompression exercises and supine scapular series with no resistance. Pt did not have any increased pain with these exercises but was fatigued. Performed gentle PROM to bilateral shoulders in direction of flexion, abduction and ER to pt's tolerance. Patient stated she was feeling some better but after she stood up the pain that she has at times started. She had pain from  her right inferior scapula radiating towards her spine and she had to sit down and lean over to her left side for relief. The pain dissapated after a few minutes and pt stated it was better and she was ok to leave.    Rehab Potential Good   Clinical Impairments Affecting Rehab Potential hx of radiation, T1-3 laminectomy and posterior C7-T5 fixation   PT Frequency 2x / week   PT Duration 4 weeks   PT Treatment/Interventions Therapeutic exercise;ADLs/Self Care Home Management;Passive range of motion;Manual techniques;Patient/family education   PT Next Visit Plan Review HEP without resistance due to bony mets and add posterior pelvic tilts in supine. Cont Rt UE P/ROM to tolerance.   PT Home Exercise Plan meeks decompression exercises; supine scapular series without resistance   Consulted and Agree with Plan of Care Patient      Patient will benefit from skilled therapeutic intervention in order to improve the following deficits and impairments:  Decreased range of motion, Decreased strength, Impaired UE functional use, Postural dysfunction, Pain  Visit Diagnosis: Posture abnormality  Muscle weakness (generalized)  Stiffness of right shoulder, not elsewhere classified  Stiffness of left shoulder, not elsewhere classified     Problem List Patient Active Problem List   Diagnosis Date Noted  . Neuropathy ([place]) 03/27/2016  . Breast cancer of upper-outer quadrant of left female breast ([place]) 09/11/2015  . BMI  21.95 03/14/2015  . Metastasis to bone ([place]) 10/20/2014  . Genetic testing 10/19/2014  . Family history of breast cancer   . Family history of ovarian cancer   . Breast cancer metastasized to multiple sites ([place]) 08/28/2014  . 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 Medication management 07/07/2013  . Labile hypertension   . Hyperlipidemia   . Prediabetes   . Vitamin D deficiency   . Osteoporosis   . IBS (irritable bowel  syndrome)     Alexia Freestone 04/29/2016, 4:09 PM  [name], [place], [address] Phone: [phone]   Fax:  [phone]  Name: MALLEY ESCOBAR MRN: [id number] Date of Birth: 1941-03-29   Allyson Sabal, PT 04/29/16 4:10 PM

## 2016-05-01 ENCOUNTER — Ambulatory Visit: Payer: Medicare Other

## 2016-05-01 DIAGNOSIS — M25612 Stiffness of left shoulder, not elsewhere classified: Secondary | ICD-10-CM

## 2016-05-01 DIAGNOSIS — R293 Abnormal posture: Secondary | ICD-10-CM

## 2016-05-01 DIAGNOSIS — M25611 Stiffness of right shoulder, not elsewhere classified: Secondary | ICD-10-CM | POA: Diagnosis not present

## 2016-05-01 DIAGNOSIS — M6281 Muscle weakness (generalized): Secondary | ICD-10-CM | POA: Diagnosis not present

## 2016-05-01 NOTE — Patient Instructions (Signed)
PELVIC TILT: Posterior    Tighten abdominals, flatten low back. _10__ reps per set, _2-3__ sets per day.   With legs bent and holding pelvic tilt throughout: 1. Open and close knees 10 times. 2. Alternate marching 10 times each leg 3. Heel slide 5-10 times each leg (when this becomes easier progress to keeping heel off bed during motion)  Cancer Rehab [id number]   Copyright  VHI. All rights reserved.

## 2016-05-01 NOTE — Therapy (Signed)
[place], [place], [address] Phone: [phone]   Fax:  [phone]  Physical Therapy Treatment  Patient Details  Name: April Rosales MRN: [id number] Date of Birth: 10/13/1940 Referring Provider: Dr. Isidore Moos  Encounter Date: 05/01/2016      PT End of Session - 05/01/16 1155    Visit Number 4   Number of Visits 9   Date for PT Re-Evaluation 05/20/16   PT Start Time 1107   PT Stop Time 1148   PT Time Calculation (min) 41 min   Activity Tolerance Patient tolerated treatment well   Behavior During Therapy [facility] for tasks assessed/performed      Past Medical History:  Diagnosis Date  . Arthritis   . Breast cancer [facility]) 1985/2016   takes [name] daily  . Chronic back pain    stenosis/listhesis  . Family history of ovarian cancer   . Osteoporosis    takes Vit D  . Radiation 11/23/14-12/11/14   30 Gy T1-T5     Past Surgical History:  Procedure Laterality Date  . ABDOMINAL HYSTERECTOMY     1980  . APPENDECTOMY  1977  . APPLICATION OF INTRAOPERATIVE CT SCAN N/A 10/20/2014   Procedure: APPLICATION OF INTRAOPERATIVE CAT SCAN;  Surgeon: [name], MD;  Location: [place] NEURO ORS;  Service: Neurosurgery;  Laterality: N/A;  . BREAST SURGERY Right 1985  . THYROID CYST EXCISION  1967    There were no vitals filed for this visit.      Subjective Assessment - 05/01/16 1108    Subjective Haven;t had anymore sharp pains since my last visit at my inferior scapula. I was having some pain at my posterior right shoulder yesterday that was like a razor but it's better today, just feels like its sore (pt points to medial border of Rt scapula).   Pertinent History 1985- pt underwent right lumpectomy with 30 lymph nodes removed, 2016-left breast cancer with bony mets, pt had surgery on her back to remove a tumor in the bone in 2016, T1-3 laminectomy and posterior C7-T5 fixation, completed radiation   How long can  you sit comfortably? in upright position can sit indefinately but when she stands is when she has pain   Patient Stated Goals to stop the pain   Currently in Pain? No/denies                         [facility] Adult PT Treatment/Exercise - 05/01/16 0001      Lumbar Exercises: Supine   Other Supine Lumbar Exercises Posterior pelvic tilts 10 times with VC to not incorporate hip flexors; then with tilt: open/close knees, then alternate marching 10 times each, then heels slides 2 sets of 5 each leg. Pt did very well with being aware of when she was relaxing out of tilt and being able to self correct.      Manual Therapy   Manual Therapy Passive ROM   Manual therapy comments Bil shoulders into flexion, abduction and ER to pt's tolerance                PT Education - 05/01/16 1123    Education provided Yes   Education Details Lumbar stabs    Person(s) Educated Patient   Methods Explanation;Demonstration;Handout   Comprehension Verbalized understanding;Returned demonstration                [facility] Goals - 04/22/16 1617  CC Long Term Goal  #1   Title Patient will be independent in a home exercise program for continued strengthening and stretching   Time 4   Period Weeks   Status New     CC Long Term Goal  #2   Title Patient will report a 50% improvement in right scapular pain to allow improved comfort   Time 4   Period Weeks   Status New     CC Long Term Goal  #3   Title Pt will demonstrate improved right shoulder flexion ROM to 140 degrees to allow pt to reach items on shelf   Baseline 118   Time 4   Period Weeks   Status New            Plan - 05/01/16 1156    Clinical Impression Statement Instructed pt in posterior pelvic stabd today which she returned excellent demonstration of. We briefly reviewed supine scapular series which pt did very well with. She tolerated P/ROM well without any increased pain and did not have any sharp  pain after that she did from last session.    Rehab Potential Good   Clinical Impairments Affecting Rehab Potential hx of radiation, T1-3 laminectomy and posterior C7-T5 fixation   PT Frequency 2x / week   PT Duration 4 weeks   PT Treatment/Interventions Therapeutic exercise;ADLs/Self Care Home Management;Passive range of motion;Manual techniques;Patient/family education   PT Next Visit Plan Measure ROM; Review new HEP (cont without resistance with exs due to bony mets). Cont Rt UE P/ROM to tolerance.   PT Home Exercise Plan meeks decompression exercises; supine scapular series without resistance; lumbar stabs   Consulted and Agree with Plan of Care Patient      Patient will benefit from skilled therapeutic intervention in order to improve the following deficits and impairments:  Decreased range of motion, Decreased strength, Impaired UE functional use, Postural dysfunction, Pain  Visit Diagnosis: Posture abnormality  Muscle weakness (generalized)  Stiffness of right shoulder, not elsewhere classified  Stiffness of left shoulder, not elsewhere classified     Problem List Patient Active Problem List   Diagnosis Date Noted  . Neuropathy ([place]) 03/27/2016  . Breast cancer of upper-outer quadrant of left female breast ([place]) 09/11/2015  . BMI  21.95 03/14/2015  . Metastasis to bone ([place]) 10/20/2014  . Genetic testing 10/19/2014  . Family history of breast cancer   . Family history of ovarian cancer   . Breast cancer metastasized to multiple sites ([place]) 08/28/2014  . 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 Medication management 07/07/2013  . Labile hypertension   . Hyperlipidemia   . Prediabetes   . Vitamin D deficiency   . Osteoporosis   . IBS (irritable bowel syndrome)     Otelia Limes, PTA 05/01/2016, 12:10 PM  [place],  [place], [address] Phone: [phone]   Fax:  [phone]  Name: April Rosales MRN: [id number] Date of Birth: [birth year]

## 2016-05-06 ENCOUNTER — Encounter: Payer: Self-pay | Admitting: Physical Therapy

## 2016-05-06 ENCOUNTER — Ambulatory Visit: Payer: Medicare Other | Admitting: Physical Therapy

## 2016-05-06 DIAGNOSIS — R293 Abnormal posture: Secondary | ICD-10-CM | POA: Diagnosis not present

## 2016-05-06 DIAGNOSIS — M25611 Stiffness of right shoulder, not elsewhere classified: Secondary | ICD-10-CM | POA: Diagnosis not present

## 2016-05-06 DIAGNOSIS — M25612 Stiffness of left shoulder, not elsewhere classified: Secondary | ICD-10-CM | POA: Diagnosis not present

## 2016-05-06 DIAGNOSIS — M6281 Muscle weakness (generalized): Secondary | ICD-10-CM

## 2016-05-06 NOTE — Therapy (Signed)
[place], [place], [address] Phone: [phone]   Fax:  [phone]  Physical Therapy Treatment  Patient Details  Name: MILLS AUTRY MRN: [id number] Date of Birth: 11/25/1940 Referring Provider: Dr. Isidore Moos  Encounter Date: 05/06/2016      PT End of Session - 05/06/16 1700    Visit Number 5   Number of Visits 9   Date for PT Re-Evaluation 05/20/16   PT Start Time [id number]   PT Stop Time 1559   PT Time Calculation (min) 44 min   Activity Tolerance Patient tolerated treatment well   Behavior During Therapy [facility] for tasks assessed/performed      Past Medical History:  Diagnosis Date  . Arthritis   . Breast cancer [facility]) 1985/2016   takes [name] daily  . Chronic back pain    stenosis/listhesis  . Family history of ovarian cancer   . Osteoporosis    takes Vit D  . Radiation 11/23/14-12/11/14   30 Gy T1-T5     Past Surgical History:  Procedure Laterality Date  . ABDOMINAL HYSTERECTOMY     1980  . APPENDECTOMY  1977  . APPLICATION OF INTRAOPERATIVE CT SCAN N/A 10/20/2014   Procedure: APPLICATION OF INTRAOPERATIVE CAT SCAN;  Surgeon: [name], MD;  Location: [place] [facility];  Service: Neurosurgery;  Laterality: N/A;  . BREAST SURGERY Right 1985  . THYROID CYST EXCISION  1967    There were no vitals filed for this visit.      Subjective Assessment - 05/06/16 1515    Subjective The pain is minimal. Yesterday it felt more like a needle sticking in there instead of the razor type thing but today it was been both. I had the sharp pain after I left here last Thursday. It happened when I stopped to get gas.    Pertinent History 1985- pt underwent right lumpectomy with 30 lymph nodes removed, 2016-left breast cancer with bony mets, pt had surgery on her back to remove a tumor in the bone in 2016, T1-3 laminectomy and posterior C7-T5 fixation, completed radiation   How long can you sit comfortably? in  upright position can sit indefinately but when she stands is when she has pain   Patient Stated Goals to stop the pain   Currently in Pain? Yes   Pain Score 1    Pain Location Shoulder   Pain Orientation Right   Pain Descriptors / Indicators Sharp            [facility] PT Assessment - 05/06/16 0001      AROM   Right Shoulder Flexion 125 Degrees                     [facility] Adult PT Treatment/Exercise - 05/06/16 0001      Lumbar Exercises: Supine   Other Supine Lumbar Exercises Posterior pelvic tilts 10 times with VC to not incorporate hip flexors; then with tilt: open/close knees, then alternate marching 10 times each, then heels slides 2 sets of 5 each leg. Pt did very well with being aware of when she was relaxing out of tilt and being able to self correct.      Manual Therapy   Manual Therapy Soft tissue mobilization   Soft tissue mobilization to posterior shoulder musculature including posterior rotator cuff, lats, upper traps on R side  [place] [facility] Goals - 05/06/16 1555      CC Long Term Goal  #1   Title Patient will be independent in a home exercise program for continued strengthening and stretching   Time 4   Period Weeks   Status On-going     CC Long Term Goal  #2   Title Patient will report a 50% improvement in right scapular pain to allow improved comfort   Baseline 05/06/16- 5% improvement   Time 4   Period Weeks   Status On-going     CC Long Term Goal  #3   Title Pt will demonstrate improved right shoulder flexion ROM to 140 degrees to allow pt to reach items on shelf   Baseline 118, 05/06/16- 125 degrees   Time 4   Period Weeks   Status On-going            Plan - 05/06/16 1701    Clinical Impression Statement Continued work on core muscles today and pt could feel them fatiguing. Did not perform any shoulder PROM since pt keeps having sharp pains following PT. Added soft tissue mobilization today to  R posterior shoulder musculature to attempt to decrease tightness and improve pain. Numerous of areas of tightness palpated especially at teres major. Pt has a tendency to tighten up her shoulders when performing core exercises and requires frequent cues to relax. Patient did not have any pain at end of session today. Her right shoulder ROM has improved since time of eval.    Rehab Potential Good   Clinical Impairments Affecting Rehab Potential hx of radiation, T1-3 laminectomy and posterior C7-T5 fixation   PT Frequency 2x / week   PT Duration 4 weeks   PT Treatment/Interventions Therapeutic exercise;ADLs/Self Care Home Management;Passive range of motion;Manual techniques;Patient/family education   PT Next Visit Plan ask pt about pain after leaving clinic last session; Review new HEP (cont without resistance with exs due to bony mets). Cont Rt UE P/ROM to tolerance.   PT Home Exercise Plan meeks decompression exercises; supine scapular series without resistance; lumbar stabs   Consulted and Agree with Plan of Care Patient      Patient will benefit from skilled therapeutic intervention in order to improve the following deficits and impairments:  Decreased range of motion, Decreased strength, Impaired UE functional use, Postural dysfunction, Pain  Visit Diagnosis: Posture abnormality  Muscle weakness (generalized)  Stiffness of right shoulder, not elsewhere classified     Problem List Patient Active Problem List   Diagnosis Date Noted  . Neuropathy ([place]) 03/27/2016  . Breast cancer of upper-outer quadrant of left female breast ([place]) 09/11/2015  . BMI  21.95 03/14/2015  . Metastasis to bone ([place]) 10/20/2014  . Genetic testing 10/19/2014  . Family history of breast cancer   . Family history of ovarian cancer   . Breast cancer metastasized to multiple sites ([place]) 08/28/2014  . 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 Medication  management 07/07/2013  . Labile hypertension   . Hyperlipidemia   . Prediabetes   . Vitamin D deficiency   . Osteoporosis   . IBS (irritable bowel syndrome)     Alexia Freestone 05/06/2016, 5:04 PM  [place], [place], [address] Phone: [phone]   Fax:  [phone]  Name: AREIL WHISENANT MRN: [id number] Date of Birth: Jun 25, 1940  Allyson Sabal, PT 05/06/16 5:04 PM

## 2016-05-09 ENCOUNTER — Encounter: Payer: Self-pay | Admitting: Physical Therapy

## 2016-05-09 ENCOUNTER — Ambulatory Visit: Payer: Medicare Other | Admitting: Physical Therapy

## 2016-05-09 DIAGNOSIS — M25612 Stiffness of left shoulder, not elsewhere classified: Secondary | ICD-10-CM | POA: Diagnosis not present

## 2016-05-09 DIAGNOSIS — R293 Abnormal posture: Secondary | ICD-10-CM

## 2016-05-09 DIAGNOSIS — M25611 Stiffness of right shoulder, not elsewhere classified: Secondary | ICD-10-CM | POA: Diagnosis not present

## 2016-05-09 DIAGNOSIS — M6281 Muscle weakness (generalized): Secondary | ICD-10-CM

## 2016-05-09 NOTE — Therapy (Signed)
[place], [place], [address] Phone: [phone]   Fax:  [phone]  Physical Therapy Treatment  Patient Details  Name: April Rosales MRN: [id number] Date of Birth: 05-08-1941 Referring Provider: Dr. Isidore Moos  Encounter Date: 05/09/2016      PT End of Session - 05/09/16 1151    Visit Number 6   Number of Visits 9   Date for PT Re-Evaluation 05/20/16   PT Start Time 1107   PT Stop Time 1150   PT Time Calculation (min) 43 min   Activity Tolerance Patient tolerated treatment well   Behavior During Therapy [facility] for tasks assessed/performed      Past Medical History:  Diagnosis Date  . Arthritis   . Breast cancer [facility]) 1985/2016   takes [name] daily  . Chronic back pain    stenosis/listhesis  . Family history of ovarian cancer   . Osteoporosis    takes Vit D  . Radiation 11/23/14-12/11/14   30 Gy T1-T5     Past Surgical History:  Procedure Laterality Date  . ABDOMINAL HYSTERECTOMY     1980  . APPENDECTOMY  1977  . APPLICATION OF INTRAOPERATIVE CT SCAN N/A 10/20/2014   Procedure: APPLICATION OF INTRAOPERATIVE CAT SCAN;  Surgeon: [name], MD;  Location: [place] [facility];  Service: Neurosurgery;  Laterality: N/A;  . BREAST SURGERY Right 1985  . THYROID CYST EXCISION  1967    There were no vitals filed for this visit.      Subjective Assessment - 05/09/16 1108    Subjective My back is a lot better. I think you nailed it. I have not had that sharp pain anymore. I am having a little pain from time to time. It is not radiating out but it feels like needle pricks. It is not razor sharp anymore.    Pertinent History 1985- pt underwent right lumpectomy with 30 lymph nodes removed, 2016-left breast cancer with bony mets, pt had surgery on her back to remove a tumor in the bone in 2016, T1-3 laminectomy and posterior C7-T5 fixation, completed radiation   How long can you sit comfortably? in upright  position can sit indefinately but when she stands is when she has pain   Patient Stated Goals to stop the pain   Currently in Pain? Yes   Pain Score 1    Pain Location Shoulder   Pain Orientation Right   Pain Descriptors / Indicators Discomfort   Pain Type Chronic pain   Pain Frequency Intermittent                         [facility] Adult PT Treatment/Exercise - 05/09/16 0001      Lumbar Exercises: Supine   Other Supine Lumbar Exercises Posterior pelvic tilts 10 times with VC to not incorporate hip flexors; then with tilt: open/close knees, then alternate marching 10 times each, then heels slides 2 sets of 5 each leg. Pt did very well with being aware of when she was relaxing out of tilt and being able to self correct.      Manual Therapy   Manual Therapy Soft tissue mobilization   Soft tissue mobilization to posterior shoulder musculature including posterior rotator cuff, lats, upper traps on R side                        Long Term Clinic Goals - 05/06/16 1555  CC Long Term Goal  #1   Title Patient will be independent in a home exercise program for continued strengthening and stretching   Time 4   Period Weeks   Status On-going     CC Long Term Goal  #2   Title Patient will report a 50% improvement in right scapular pain to allow improved comfort   Baseline 05/06/16- 5% improvement   Time 4   Period Weeks   Status On-going     CC Long Term Goal  #3   Title Pt will demonstrate improved right shoulder flexion ROM to 140 degrees to allow pt to reach items on shelf   Baseline 118, 05/06/16- 125 degrees   Time 4   Period Weeks   Status On-going            Plan - 05/09/16 1151    Clinical Impression Statement Continued core muscle exercises today. Patient felt great pain relief following soft tissue moblization last session so this was performed again this session. Pt felt relief aferwards and reported she was not having any pain.     Rehab Potential Good   Clinical Impairments Affecting Rehab Potential hx of radiation, T1-3 laminectomy and posterior C7-T5 fixation   PT Frequency 2x / week   PT Duration 4 weeks   PT Treatment/Interventions Therapeutic exercise;ADLs/Self Care Home Management;Passive range of motion;Manual techniques;Patient/family education   PT Next Visit Plan ask pt about pain after leaving clinic last session; Review new HEP (cont without resistance with exs due to bony mets). Cont Rt UE P/ROM to tolerance.   PT Home Exercise Plan meeks decompression exercises; supine scapular series without resistance; lumbar stabs   Consulted and Agree with Plan of Care Patient      Patient will benefit from skilled therapeutic intervention in order to improve the following deficits and impairments:  Decreased range of motion, Decreased strength, Impaired UE functional use, Postural dysfunction, Pain  Visit Diagnosis: Posture abnormality  Muscle weakness (generalized)     Problem List Patient Active Problem List   Diagnosis Date Noted  . Neuropathy ([place]) 03/27/2016  . Breast cancer of upper-outer quadrant of left female breast ([place]) 09/11/2015  . BMI  21.95 03/14/2015  . Metastasis to bone ([place]) 10/20/2014  . Genetic testing 10/19/2014  . Family history of breast cancer   . Family history of ovarian cancer   . Breast cancer metastasized to multiple sites ([place]) 08/28/2014  . 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 Medication management 07/07/2013  . Labile hypertension   . Hyperlipidemia   . Prediabetes   . Vitamin D deficiency   . Osteoporosis   . IBS (irritable bowel syndrome)     Alexia Freestone 05/09/2016, 11:53 AM  [place], [place], [address] Phone: [phone]   Fax:  [phone]  Name: [place] Rosales MRN: [id number] Date of Birth: 03/25/1941  Allyson Sabal, PT 05/09/16 11:54 AM

## 2016-05-13 ENCOUNTER — Ambulatory Visit: Payer: Medicare Other | Admitting: Physical Therapy

## 2016-05-13 ENCOUNTER — Encounter: Payer: Self-pay | Admitting: Physical Therapy

## 2016-05-13 DIAGNOSIS — M6281 Muscle weakness (generalized): Secondary | ICD-10-CM

## 2016-05-13 DIAGNOSIS — R293 Abnormal posture: Secondary | ICD-10-CM | POA: Diagnosis not present

## 2016-05-13 DIAGNOSIS — M25611 Stiffness of right shoulder, not elsewhere classified: Secondary | ICD-10-CM | POA: Diagnosis not present

## 2016-05-13 DIAGNOSIS — M25612 Stiffness of left shoulder, not elsewhere classified: Secondary | ICD-10-CM | POA: Diagnosis not present

## 2016-05-13 NOTE — Therapy (Signed)
[place], [place], [address] Phone: [phone]   Fax:  [phone]  Physical Therapy Treatment  Patient Details  Name: April Rosales MRN: [id number] Date of Birth: 1941/05/02 Referring Provider: Dr. Isidore Moos  Encounter Date: 05/13/2016      PT End of Session - 05/13/16 1435    Visit Number 7   Number of Visits 9   Date for PT Re-Evaluation 05/20/16   PT Start Time [id number]   PT Stop Time 1432   PT Time Calculation (min) 43 min   Activity Tolerance Patient tolerated treatment well   Behavior During Therapy [facility] for tasks assessed/performed      Past Medical History:  Diagnosis Date  . Arthritis   . Breast cancer [facility]) 1985/2016   takes [name] daily  . Chronic back pain    stenosis/listhesis  . Family history of ovarian cancer   . Osteoporosis    takes Vit D  . Radiation 11/23/14-12/11/14   30 Gy T1-T5     Past Surgical History:  Procedure Laterality Date  . ABDOMINAL HYSTERECTOMY     1980  . APPENDECTOMY  1977  . APPLICATION OF INTRAOPERATIVE CT SCAN N/A 10/20/2014   Procedure: APPLICATION OF INTRAOPERATIVE CAT SCAN;  Surgeon: [name], MD;  Location: [place] [facility];  Service: Neurosurgery;  Laterality: N/A;  . BREAST SURGERY Right 1985  . THYROID CYST EXCISION  1967    There were no vitals filed for this visit.      Subjective Assessment - 05/13/16 1350    Subjective After I left here Friday it really bothered me. Sunday was really bad it was razor sharp pain. I had to sit for 2 hours in church. I have been trying to massage my back on the door frame.    Pertinent History 1985- pt underwent right lumpectomy with 30 lymph nodes removed, 2016-left breast cancer with bony mets, pt had surgery on her back to remove a tumor in the bone in 2016, T1-3 laminectomy and posterior C7-T5 fixation, completed radiation   How long can you sit comfortably? in upright position can sit indefinately  but when she stands is when she has pain   Patient Stated Goals to stop the pain   Currently in Pain? Yes   Pain Score 1    Pain Location Shoulder   Pain Orientation Right   Pain Descriptors / Indicators Discomfort   Pain Type Chronic pain   Pain Frequency Intermittent                         [facility] Adult PT Treatment/Exercise - 05/13/16 0001      Lumbar Exercises: Supine   Other Supine Lumbar Exercises Posterior pelvic tilts 10 times; then with tilt: open/close knees, then alternate marching 10 times each, then heels slides 2 sets of 5 each leg. Pt required less cueing today for these exercises and was able to maintain pelvic tilt better     Shoulder Exercises: Supine   Horizontal ABduction Both;10 reps;AROM   External Rotation Both;10 reps;AROM   Flexion Both;10 reps;AROM  Narrow and wide grip 10 times each   Other Supine Exercises Bil D2 10 times each UE      Manual Therapy   Manual Therapy Soft tissue mobilization   Soft tissue mobilization to posterior shoulder musculature including posterior rotator cuff, lats, upper traps on R side  [place] [facility] Goals - 05/06/16 1555      CC Long Term Goal  #1   Title Patient will be independent in a home exercise program for continued strengthening and stretching   Time 4   Period Weeks   Status On-going     CC Long Term Goal  #2   Title Patient will report a 50% improvement in right scapular pain to allow improved comfort   Baseline 05/06/16- 5% improvement   Time 4   Period Weeks   Status On-going     CC Long Term Goal  #3   Title Pt will demonstrate improved right shoulder flexion ROM to 140 degrees to allow pt to reach items on shelf   Baseline 118, 05/06/16- 125 degrees   Time 4   Period Weeks   Status On-going            Plan - 05/13/16 1433    Clinical Impression Statement Continued core strengthening exercises and shoulder ROM exercises today. Patient  had a recurrence of pain but it has not been as sharp as it was at time of evaluation. Soft tissue mobilization performed today with relaxation of muscles felt. Once muscles relaxed ropey texture of muscles could be palapated with numerous "knots."    Rehab Potential Good   Clinical Impairments Affecting Rehab Potential hx of radiation, T1-3 laminectomy and posterior C7-T5 fixation   PT Frequency 2x / week   PT Duration 4 weeks   PT Treatment/Interventions Therapeutic exercise;ADLs/Self Care Home Management;Passive range of motion;Manual techniques;Patient/family education   PT Next Visit Plan ask pt about pain after leaving clinic last session; Review new HEP (cont without resistance with exs due to bony mets). Cont Rt UE P/ROM to tolerance, cont core exercises   PT Home Exercise Plan meeks decompression exercises; supine scapular series without resistance; lumbar stabs   Consulted and Agree with Plan of Care Patient      Patient will benefit from skilled therapeutic intervention in order to improve the following deficits and impairments:  Decreased range of motion, Decreased strength, Impaired UE functional use, Postural dysfunction, Pain  Visit Diagnosis: Posture abnormality  Muscle weakness (generalized)  Stiffness of right shoulder, not elsewhere classified  Stiffness of left shoulder, not elsewhere classified     Problem List Patient Active Problem List   Diagnosis Date Noted  . Neuropathy ([place]) 03/27/2016  . Breast cancer of upper-outer quadrant of left female breast ([place]) 09/11/2015  . BMI  21.95 03/14/2015  . Metastasis to bone ([place]) 10/20/2014  . Genetic testing 10/19/2014  . Family history of breast cancer   . Family history of ovarian cancer   . Breast cancer metastasized to multiple sites ([place]) 08/28/2014  . 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 Medication management 07/07/2013  . Labile hypertension   .  Hyperlipidemia   . Prediabetes   . Vitamin D deficiency   . Osteoporosis   . IBS (irritable bowel syndrome)     Alexia Freestone 05/13/2016, 2:36 PM  [place], [place], [address] Phone: [phone]   Fax:  [phone]  Name: NANYAMKA RAGA MRN: [id number] Date of Birth: 1941/05/05   Allyson Sabal, PT 05/13/16 2:36 PM

## 2016-05-15 ENCOUNTER — Encounter: Payer: Self-pay | Admitting: Physical Therapy

## 2016-05-15 ENCOUNTER — Ambulatory Visit: Payer: Medicare Other | Admitting: Physical Therapy

## 2016-05-15 DIAGNOSIS — M25611 Stiffness of right shoulder, not elsewhere classified: Secondary | ICD-10-CM

## 2016-05-15 DIAGNOSIS — M6281 Muscle weakness (generalized): Secondary | ICD-10-CM

## 2016-05-15 DIAGNOSIS — R293 Abnormal posture: Secondary | ICD-10-CM | POA: Diagnosis not present

## 2016-05-15 DIAGNOSIS — M25612 Stiffness of left shoulder, not elsewhere classified: Secondary | ICD-10-CM | POA: Diagnosis not present

## 2016-05-15 NOTE — Therapy (Signed)
[place], [place], [address] Phone: [phone]   Fax:  [phone]  Physical Therapy Treatment  Patient Details  Name: April Rosales MRN: [id number] Date of Birth: December 12, 1940 Referring Provider: Dr. Isidore Moos  Encounter Date: 05/15/2016      PT End of Session - 05/15/16 1654    Visit Number 8   Number of Visits 9   Date for PT Re-Evaluation 05/20/16   PT Start Time 1521   PT Stop Time 1604   PT Time Calculation (min) 43 min   Activity Tolerance Patient tolerated treatment well   Behavior During Therapy [facility] for tasks assessed/performed      Past Medical History:  Diagnosis Date  . Arthritis   . Breast cancer [facility]) 1985/2016   takes [name] daily  . Chronic back pain    stenosis/listhesis  . Family history of ovarian cancer   . Osteoporosis    takes Vit D  . Radiation 11/23/14-12/11/14   30 Gy T1-T5     Past Surgical History:  Procedure Laterality Date  . ABDOMINAL HYSTERECTOMY     1980  . APPENDECTOMY  1977  . APPLICATION OF INTRAOPERATIVE CT SCAN N/A 10/20/2014   Procedure: APPLICATION OF INTRAOPERATIVE CAT SCAN;  Surgeon: [name], MD;  Location: [place] [facility];  Service: Neurosurgery;  Laterality: N/A;  . BREAST SURGERY Right 1985  . THYROID CYST EXCISION  1967    There were no vitals filed for this visit.      Subjective Assessment - 05/15/16 1524    Subjective I don't know what it is but everytime I leave here I get that sharp pain. It is something in this certain spot. I got home Tuesday and that pain hit me and I just had to sit down.    Pertinent History 1985- pt underwent right lumpectomy with 30 lymph nodes removed, 2016-left breast cancer with bony mets, pt had surgery on her back to remove a tumor in the bone in 2016, T1-3 laminectomy and posterior C7-T5 fixation, completed radiation   How long can you sit comfortably? in upright position can sit indefinately but when  she stands is when she has pain   Patient Stated Goals to stop the pain   Currently in Pain? Yes   Pain Score 1    Pain Location Shoulder   Pain Orientation Right   Pain Descriptors / Indicators Discomfort   Pain Type Chronic pain   Pain Frequency Intermittent                         [facility] Adult PT Treatment/Exercise - 05/15/16 0001      Lumbar Exercises: Supine   Other Supine Lumbar Exercises Posterior pelvic tilts 10 times; then with tilt: open/close knees, then alternate marching 10 times each, then heels slides 2 sets of 5 each leg. Pt required less cueing today for these exercises and was able to maintain pelvic tilt better   Other Supine Lumbar Exercises bridging x 10 reps     Shoulder Exercises: Supine   Protraction AROM;10 reps;Both   Horizontal ABduction Both;10 reps;AROM   External Rotation Both;10 reps;AROM   Flexion Both;10 reps;AROM  Narrow and wide grip 10 times each   Other Supine Exercises Bil D2 10 times each UE                         Long Term Clinic Goals -  05/15/16 1531      [place] Term Goal  #1   Title Patient will be independent in a home exercise program for continued strengthening and stretching   Baseline 05/15/16- pt has been compliant with her current HEP- have not added exercises recently due to pain   Time 4   Period Weeks   Status On-going     CC Long Term Goal  #2   Title Patient will report a 50% improvement in right scapular pain to allow improved comfort   Baseline 05/06/16- 5% improvement, 05/15/16- pt's progress has been up and down- some days her pain is better and others it is just as bad   Time 4   Period Weeks   Status On-going     CC Long Term Goal  #3   Title Pt will demonstrate improved right shoulder flexion ROM to 140 degrees to allow pt to reach items on shelf   Baseline 118, 05/06/16- 125 degrees, 05/15/16- 126 degrees            Plan - 05/15/16 1655    Clinical Impression Statement  Patient is still having episodes of right scapular pain. No massage was performed today in case this was the cause of the pain. Pt was instructed in gentle active shoulder exercises and core strengthening exercises. When pt stood up at the end of the session she had increased right scapular pain again that was very sharp.    Rehab Potential Good   Clinical Impairments Affecting Rehab Potential hx of radiation, T1-3 laminectomy and posterior C7-T5 fixation   PT Frequency 2x / week   PT Duration 4 weeks   PT Treatment/Interventions Therapeutic exercise;ADLs/Self Care Home Management;Passive range of motion;Manual techniques;Patient/family education   PT Next Visit Plan ask pt about pain after leaving clinic last session; Review new HEP (cont without resistance with exs due to bony mets). Cont Rt UE P/ROM to tolerance, cont core exercises   PT Home Exercise Plan meeks decompression exercises; supine scapular series without resistance; lumbar stabs   Consulted and Agree with Plan of Care Patient      Patient will benefit from skilled therapeutic intervention in order to improve the following deficits and impairments:  Decreased range of motion, Decreased strength, Impaired UE functional use, Postural dysfunction, Pain  Visit Diagnosis: Posture abnormality  Muscle weakness (generalized)  Stiffness of right shoulder, not elsewhere classified  Stiffness of left shoulder, not elsewhere classified     Problem List Patient Active Problem List   Diagnosis Date Noted  . Neuropathy ([place]) 03/27/2016  . Breast cancer of upper-outer quadrant of left female breast ([place]) 09/11/2015  . BMI  21.95 03/14/2015  . Metastasis to bone ([place]) 10/20/2014  . Genetic testing 10/19/2014  . Family history of breast cancer   . Family history of ovarian cancer   . Breast cancer metastasized to multiple sites ([place]) 08/28/2014  . 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 Medication management 07/07/2013  . Labile hypertension   . Hyperlipidemia   . Prediabetes   . Vitamin D deficiency   . Osteoporosis   . IBS (irritable bowel syndrome)     Alexia Freestone 05/15/2016, 4:58 PM  [place], [place], [address] Phone: [phone]   Fax:  [phone]  Name: April Rosales MRN: [id number] Date of Birth: Mar 21, 1941  Manus Gunning, PT 05/15/16 4:58 PM

## 2016-05-20 ENCOUNTER — Other Ambulatory Visit (HOSPITAL_BASED_OUTPATIENT_CLINIC_OR_DEPARTMENT_OTHER): Payer: Medicare Other

## 2016-05-20 ENCOUNTER — Encounter: Payer: Medicare Other | Admitting: Physical Therapy

## 2016-05-20 ENCOUNTER — Ambulatory Visit (HOSPITAL_BASED_OUTPATIENT_CLINIC_OR_DEPARTMENT_OTHER): Payer: Medicare Other

## 2016-05-20 VITALS — BP 144/73 | HR 80 | Temp 98.0°F | Resp 20

## 2016-05-20 DIAGNOSIS — C50412 Malignant neoplasm of upper-outer quadrant of left female breast: Secondary | ICD-10-CM | POA: Diagnosis not present

## 2016-05-20 DIAGNOSIS — M810 Age-related osteoporosis without current pathological fracture: Secondary | ICD-10-CM

## 2016-05-20 DIAGNOSIS — C50912 Malignant neoplasm of unspecified site of left female breast: Secondary | ICD-10-CM | POA: Diagnosis not present

## 2016-05-20 DIAGNOSIS — C7951 Secondary malignant neoplasm of bone: Secondary | ICD-10-CM

## 2016-05-20 DIAGNOSIS — C50919 Malignant neoplasm of unspecified site of unspecified female breast: Secondary | ICD-10-CM

## 2016-05-20 LAB — COMPREHENSIVE METABOLIC PANEL
ALBUMIN: 3.7 g/dL (ref 3.5–5.0)
ALK PHOS: 38 U/L — AB (ref 40–150)
ALT: 18 U/L (ref 0–55)
ANION GAP: 9 meq/L (ref 3–11)
AST: 26 U/L (ref 5–34)
BUN: 16.5 mg/dL (ref 7.0–26.0)
CALCIUM: 10.5 mg/dL — AB (ref 8.4–10.4)
CHLORIDE: 107 meq/L (ref 98–109)
CO2: 23 mEq/L (ref 22–29)
CREATININE: 0.7 mg/dL (ref 0.6–1.1)
EGFR: 84 mL/min/{1.73_m2} — ABNORMAL LOW (ref >90)
Glucose: 82 mg/dl (ref 70–140)
Sodium: 140 mEq/L (ref 136–145)
Total Bilirubin: 0.37 mg/dL (ref 0.20–1.20)
Total Protein: 7.3 g/dL (ref 6.4–8.3)

## 2016-05-20 LAB — CBC WITH DIFFERENTIAL/PLATELET
BASO%: 1.2 % (ref 0.0–2.0)
BASOS ABS: 0.1 10*3/uL (ref 0.0–0.1)
EOS%: 2.1 % (ref 0.0–7.0)
Eosinophils Absolute: 0.1 10*3/uL (ref 0.0–0.5)
HEMATOCRIT: 42.9 % (ref 34.8–46.6)
HEMOGLOBIN: 14.8 g/dL (ref 11.6–15.9)
LYMPH#: 1 10*3/uL (ref 0.9–3.3)
LYMPH%: 22.8 % (ref 14.0–49.7)
MCH: 31.3 pg (ref 25.1–34.0)
MCHC: 34.5 g/dL (ref 31.5–36.0)
MCV: 90.8 fL (ref 79.5–101.0)
MONO#: 0.3 10*3/uL (ref 0.1–0.9)
MONO%: 7.3 % (ref 0.0–14.0)
NEUT#: 3 10*3/uL (ref 1.5–6.5)
NEUT%: 66.6 % (ref 38.4–76.8)
PLATELETS: 233 10*3/uL (ref 145–400)
RBC: 4.72 10*6/uL (ref 3.70–5.45)
RDW: 14.4 % (ref 11.2–14.5)
WBC: 4.5 10*3/uL (ref 3.9–10.3)

## 2016-05-20 MED ORDER — DENOSUMAB 120 MG/1.7ML ~~LOC~~ SOLN
120.0000 mg | Freq: Once | SUBCUTANEOUS | Status: AC
  Administered 2016-05-20: 120 mg via SUBCUTANEOUS
  Filled 2016-05-20: qty 1.7

## 2016-05-20 NOTE — Patient Instructions (Signed)
Denosumab injection What is this medicine? DENOSUMAB (den oh sue mab) slows bone breakdown. Prolia is used to treat osteoporosis in women after menopause and in men. Xgeva is used to prevent bone fractures and other bone problems caused by cancer bone metastases. Xgeva is also used to treat giant cell tumor of the bone. COMMON BRAND NAME(S): Prolia, XGEVA What should I tell my health care provider before I take this medicine? They need to know if you have any of these conditions: -dental disease -eczema -infection or history of infections -kidney disease or on dialysis -low blood calcium or vitamin D -malabsorption syndrome -scheduled to have surgery or tooth extraction -taking medicine that contains denosumab -thyroid or parathyroid disease -an unusual reaction to denosumab, other medicines, foods, dyes, or preservatives -pregnant or trying to get pregnant -breast-feeding How should I use this medicine? This medicine is for injection under the skin. It is given by a health care professional in a hospital or clinic setting. If you are getting Prolia, a special MedGuide will be given to you by the pharmacist with each prescription and refill. Be sure to read this information carefully each time. For Prolia, talk to your pediatrician regarding the use of this medicine in children. Special care may be needed. For Xgeva, talk to your pediatrician regarding the use of this medicine in children. While this drug may be prescribed for children as young as 13 years for selected conditions, precautions do apply. What if I miss a dose? It is important not to miss your dose. Call your doctor or health care professional if you are unable to keep an appointment. What may interact with this medicine? Do not take this medicine with any of the following medications: -other medicines containing denosumab This medicine may also interact with the following medications: -medicines that suppress the immune  system -medicines that treat cancer -steroid medicines like prednisone or cortisone What should I watch for while using this medicine? Visit your doctor or health care professional for regular checks on your progress. Your doctor or health care professional may order blood tests and other tests to see how you are doing. Call your doctor or health care professional if you get a cold or other infection while receiving this medicine. Do not treat yourself. This medicine may decrease your body's ability to fight infection. You should make sure you get enough calcium and vitamin D while you are taking this medicine, unless your doctor tells you not to. Discuss the foods you eat and the vitamins you take with your health care professional. See your dentist regularly. Brush and floss your teeth as directed. Before you have any dental work done, tell your dentist you are receiving this medicine. Do not become pregnant while taking this medicine or for 5 months after stopping it. Women should inform their doctor if they wish to become pregnant or think they might be pregnant. There is a potential for serious side effects to an unborn child. Talk to your health care professional or pharmacist for more information. What side effects may I notice from receiving this medicine? Side effects that you should report to your doctor or health care professional as soon as possible: -allergic reactions like skin rash, itching or hives, swelling of the face, lips, or tongue -breathing problems -chest pain -fast, irregular heartbeat -feeling faint or lightheaded, falls -fever, chills, or any other sign of infection -muscle spasms, tightening, or twitches -numbness or tingling -skin blisters or bumps, or is dry, peels, or red -slow   healing or unexplained pain in the mouth or jaw -unusual bleeding or bruising Side effects that usually do not require medical attention (report to your doctor or health care professional  if they continue or are bothersome): -muscle pain -stomach upset, gas Where should I keep my medicine? This medicine is only given in a clinic, doctor's office, or other health care setting and will not be stored at home.  2017 Elsevier/Gold Standard (2015-06-14 10:06:55)  

## 2016-05-21 LAB — CANCER ANTIGEN 27.29: CAN 27.29: 23 U/mL (ref 0.0–38.6)

## 2016-05-23 ENCOUNTER — Other Ambulatory Visit: Payer: Self-pay | Admitting: Oncology

## 2016-05-27 ENCOUNTER — Ambulatory Visit: Payer: Medicare Other | Attending: Radiation Oncology | Admitting: Physical Therapy

## 2016-05-27 DIAGNOSIS — M25611 Stiffness of right shoulder, not elsewhere classified: Secondary | ICD-10-CM | POA: Diagnosis not present

## 2016-05-27 DIAGNOSIS — M25612 Stiffness of left shoulder, not elsewhere classified: Secondary | ICD-10-CM | POA: Diagnosis not present

## 2016-05-27 DIAGNOSIS — M6281 Muscle weakness (generalized): Secondary | ICD-10-CM | POA: Insufficient documentation

## 2016-05-27 DIAGNOSIS — R293 Abnormal posture: Secondary | ICD-10-CM | POA: Insufficient documentation

## 2016-05-27 NOTE — Therapy (Signed)
[place], [place], [address] Phone: [phone]   Fax:  [phone]  Physical Therapy Treatment  Patient Details  Name: April Rosales MRN: [id number] Date of Birth: [birth year] Referring Provider: Dr. Isidore Moos  Encounter Date: 05/27/2016      PT End of Session - 05/27/16 1217    Visit Number 9   Number of Visits 17   Date for PT Re-Evaluation 06/24/16   PT Start Time 1100   PT Stop Time 1145   PT Time Calculation (min) 45 min   Activity Tolerance Patient limited by pain   Behavior During Therapy [facility] for tasks assessed/performed      Past Medical History:  Diagnosis Date  . Arthritis   . Breast cancer [facility]) 1985/2016   takes [name] daily  . Chronic back pain    stenosis/listhesis  . Family history of ovarian cancer   . Osteoporosis    takes Vit D  . Radiation 11/23/14-12/11/14   30 Gy T1-T5     Past Surgical History:  Procedure Laterality Date  . ABDOMINAL HYSTERECTOMY     1980  . APPENDECTOMY  1977  . APPLICATION OF INTRAOPERATIVE CT SCAN N/A 10/20/2014   Procedure: APPLICATION OF INTRAOPERATIVE CAT SCAN;  Surgeon: [name], MD;  Location: [place] [facility];  Service: Neurosurgery;  Laterality: N/A;  . BREAST SURGERY Right 1985  . THYROID CYST EXCISION  1967    There were no vitals filed for this visit.      Subjective Assessment - 05/27/16 1106    Subjective Pt comes in with lots of pain today. she says she has not had any improvement with the exercises.  She says she gets some relief for a time, but she does not think it is helping the cause of the pain. She thinks her pain may be getting worse and knows her posture may have something to do with it.  She said Dr. Rita Ohara says there is nothing he can do for her. Pain medications have not provided significant relief    Pertinent History 1985- pt underwent right lumpectomy with 30 lymph nodes removed, 2016-left breast cancer with bony  mets, pt had surgery on her back to remove a tumor in the bone in 2016, T1-3 laminectomy and posterior C7-T5 fixation, completed radiation   How long can you sit comfortably? pt finds a comfortable position sitting with weight shift and slight rotation toward left side with right shoulder hiked and pulled forward    Patient Stated Goals to stop the pain   Currently in Pain? Yes   Pain Score 10-Worst pain ever  pt appears to be very uncomfortable.    Pain Location Scapula  Pt states it keeps going down her back    Pain Orientation Right   Pain Descriptors / Indicators Stabbing;Pins and needles;Sharp   Pain Type Chronic pain   Pain Radiating Towards down right scapula   Pain Onset More than a month ago   Aggravating Factors  changing position from sit to stand or sidelying to sit   Pain Relieving Factors moist heat helps                          [facility] Adult PT Treatment/Exercise - 05/27/16 0001      Knee/Hip Exercises: Standing   Other Standing Knee Exercises sit to stand from a high table x 5 reps       Shoulder Exercises: Standing  External Rotation Strengthening;Both;5 reps;Theraband   Theraband Level (Shoulder External Rotation) Level 1 (Yellow)   Extension Strengthening;Right;Left;5 reps;Theraband   Theraband Level (Shoulder Extension) Level 1 (Yellow)   Extension Limitations pt could only do one arm at a time without pain      Modalities   Modalities Moist Heat     Moist Heat Therapy   Number Minutes Moist Heat 15 Minutes   Moist Heat Location --  right scapular area in sitting and left sidelying                 PT Education - 05/27/16 1222    Education provided Yes   Education Details standing shoulder extension and external rotation with yellow theraband    Person(s) Educated Patient   Methods Explanation;Demonstration;Handout   Comprehension Verbalized understanding;Returned demonstration                [facility] Goals -  05/27/16 1230      CC Long Term Goal  #1   Title Patient will be independent in a home exercise program for continued strengthening and stretching   Baseline 05/15/16- pt has been compliant with her current HEP- have not added exercises recently due to pain   Time 4   Period Weeks   Status On-going     CC Long Term Goal  #2   Title Patient will report a 50% improvement in right scapular pain to allow improved comfort   Baseline 05/06/16- 5% improvement, 05/15/16- pt's progress has been up and down- some days her pain is better and others it is just as bad   Time 4   Period Weeks   Status On-going     CC Long Term Goal  #3   Title Pt will demonstrate improved right shoulder flexion ROM to 140 degrees to allow pt to reach items on shelf   Baseline 118, 05/06/16- 125 degrees, 05/15/16- 126 degrees   Time 4   Status On-going            Plan - 05/27/16 1223    Clinical Impression Statement Patinet continues with pain and has not received relief with initial PT and supine exercise.  Upgraded exercise to standing today to strenthen back extensors and external rotators in a different position to see if that will help.  Would like to try TENS for self management at home and dry needling to decrease muslce spasms to see if that will help with pain managment.  Please sign cerification as the order for these if you agree.     Rehab Potential Good   Clinical Impairments Affecting Rehab Potential hx of radiation, T1-3 laminectomy and posterior C7-T5 fixation   PT Frequency 2x / week   PT Duration 4 weeks   PT Treatment/Interventions Therapeutic exercise;ADLs/Self Care Home Management;Passive range of motion;Manual techniques;Patient/family education;Electrical Stimulation;Moist Heat;Dry needling  Transcutaneous Electrical Nerve Stimulation (TENS)    PT Next Visit Plan assess effect of pain with standing exercise and add isometric exercise for UE if  able. continue extension exercise if no pain,  check recert for order for dry needling and schedule.  TENS trial during treatment if OK and issue next session if effectivce Quick DASH for  GCode next    Consulted and Agree with Plan of Care Patient      Patient will benefit from skilled therapeutic intervention in order to improve the following deficits and impairments:  Decreased range of motion, Decreased strength, Impaired UE functional use, Postural dysfunction, Pain  Visit Diagnosis: Posture abnormality - Plan: PT plan of care cert/re-cert  Muscle weakness (generalized) - Plan: PT plan of care cert/re-cert  Stiffness of right shoulder, not elsewhere classified - Plan: PT plan of care cert/re-cert  Stiffness of left shoulder, not elsewhere classified - Plan: PT plan of care cert/re-cert     Problem List Patient Active Problem List   Diagnosis Date Noted  . Neuropathy ([place]) 03/27/2016  . Breast cancer of upper-outer quadrant of left female breast ([place]) 09/11/2015  . BMI  21.95 03/14/2015  . Metastasis to bone ([facility]) 10/20/2014  . Genetic testing 10/19/2014  . Family history of breast cancer   . Family history of ovarian cancer   . Breast cancer metastasized to multiple sites ([place]) 08/28/2014  . 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 Medication management 07/07/2013  . Labile hypertension   . Hyperlipidemia   . Prediabetes   . Vitamin D deficiency   . Osteoporosis   . IBS (irritable bowel syndrome)    Helene Kelp K. Owens Shark PT  Norwood Levo 05/27/2016, 12:37 PM  [place], [place], [address] Phone: [phone]   Fax:  [phone]  Name: April Rosales MRN: [id number] Date of Birth: 27-Nov-1940

## 2016-05-27 NOTE — Patient Instructions (Signed)
EXTENSION: Standing - Resistance Band: Stable (Active)    Stand, right arm at side. Against yellow resistance band, draw arm backward, as far as possible, keeping elbow straight. Complete _2__ sets of __5_ repetitions. Perform _2__ sessions per day.  Copyright  VHI. All rights reserved.   Copyright  VHI. All rights reserved.

## 2016-05-28 ENCOUNTER — Other Ambulatory Visit: Payer: Self-pay | Admitting: *Deleted

## 2016-05-28 MED ORDER — LETROZOLE 2.5 MG PO TABS: ORAL_TABLET | ORAL | 0 refills | Status: DC

## 2016-05-30 ENCOUNTER — Other Ambulatory Visit: Payer: Self-pay | Admitting: *Deleted

## 2016-05-30 ENCOUNTER — Ambulatory Visit: Payer: Medicare Other | Admitting: Physical Therapy

## 2016-05-30 DIAGNOSIS — R293 Abnormal posture: Secondary | ICD-10-CM | POA: Diagnosis not present

## 2016-05-30 DIAGNOSIS — M6281 Muscle weakness (generalized): Secondary | ICD-10-CM | POA: Diagnosis not present

## 2016-05-30 DIAGNOSIS — M25612 Stiffness of left shoulder, not elsewhere classified: Secondary | ICD-10-CM

## 2016-05-30 DIAGNOSIS — M25611 Stiffness of right shoulder, not elsewhere classified: Secondary | ICD-10-CM | POA: Diagnosis not present

## 2016-05-30 MED ORDER — GABAPENTIN 300 MG PO CAPS: 300.0000 mg | ORAL_CAPSULE | Freq: Every day | ORAL | 1 refills | Status: DC

## 2016-05-30 NOTE — Therapy (Signed)
[place], [place], [address] Phone: [phone]   Fax:  [phone]  Physical Therapy Treatment  Patient Details  Name: April Rosales MRN: [id number] Date of Birth: 02/13/1941 Referring Provider: Dr. Isidore Moos  Encounter Date: 05/30/2016      PT End of Session - 05/30/16 1215    Visit Number 10   Number of Visits 17   Date for PT Re-Evaluation 06/24/16   PT Start Time [id number]   PT Stop Time 1100   PT Time Calculation (min) 45 min   Activity Tolerance Patient limited by pain   Behavior During Therapy [facility] for tasks assessed/performed      Past Medical History:  Diagnosis Date  . Arthritis   . Breast cancer [facility]) 1985/2016   takes [name] daily  . Chronic back pain    stenosis/listhesis  . Family history of ovarian cancer   . Osteoporosis    takes Vit D  . Radiation 11/23/14-12/11/14   30 Gy T1-T5     Past Surgical History:  Procedure Laterality Date  . ABDOMINAL HYSTERECTOMY     1980  . APPENDECTOMY  1977  . APPLICATION OF INTRAOPERATIVE CT SCAN N/A 10/20/2014   Procedure: APPLICATION OF INTRAOPERATIVE CAT SCAN;  Surgeon: [name], MD;  Location: [place];  Service: Neurosurgery;  Laterality: N/A;  . BREAST SURGERY Right 1985  . THYROID CYST EXCISION  1967    There were no vitals filed for this visit.      Subjective Assessment - 05/30/16 1028    Subjective Pt states yesterday her pain was minimal and it wasn't all the time . She was able to her exercises, though sometimes she could not do it with right arm, sometimes she could. Today the pain in back at lateral right shoulder blade.  She is not sure if she wants to do the dry needling just yet.    Pertinent History 1985- pt underwent right lumpectomy with 30 lymph nodes removed, 2016-left breast cancer with bony mets, pt had surgery on her back to remove a tumor in the bone in 2016, T1-3 laminectomy and posterior C7-T5 fixation,  completed radiation   How long can you sit comfortably? pt finds a comfortable position sitting with weight shift and slight rotation toward left side with right shoulder hiked and pulled forward    Patient Stated Goals to stop the pain   Currently in Pain? No/denies  but she does have pain intemittently                          [facility] Adult PT Treatment/Exercise - 05/30/16 0001      Self-Care   Self-Care Other Self-Care Comments   Other Self-Care Comments  pt does not want to pursue dry needling at this time  showed her the TENS unit and she does not want to pursue that right now either.  she wants to continue with moist pack and will try arnica gel to right trap area      Modalities   Modalities Moist Heat     Moist Heat Therapy   Number Minutes Moist Heat 15 Minutes   Moist Heat Location --  right scapular area      Manual Therapy   Manual Therapy Soft tissue mobilization   Soft tissue mobilization to posterior shoulder musculature including posterior rotator cuff, lats, upper traps on R side in sitting position  used small amount of Biofreeze  to see if that would help                         Long Term Clinic Goals - 05/27/16 1230      CC Long Term Goal  #1   Title Patient will be independent in a home exercise program for continued strengthening and stretching   Baseline 05/15/16- pt has been compliant with her current HEP- have not added exercises recently due to pain   Time 4   Period Weeks   Status On-going     CC Long Term Goal  #2   Title Patient will report a 50% improvement in right scapular pain to allow improved comfort   Baseline 05/06/16- 5% improvement, 05/15/16- pt's progress has been up and down- some days her pain is better and others it is just as bad   Time 4   Period Weeks   Status On-going     CC Long Term Goal  #3   Title Pt will demonstrate improved right shoulder flexion ROM to 140 degrees to allow pt to reach items  on shelf   Baseline 118, 05/06/16- 125 degrees, 05/15/16- 126 degrees   Time 4   Status On-going            Plan - 06-11-16 1216    Clinical Impression Statement Patient reports she has been doing the exercise at home and using a heating pad and felt better yesterday but not so good today.  She does not want to try TENS or dry needling at this time.  she will continue established exercise program and possibly try over the counter analgesics.    Rehab Potential Good   Clinical Impairments Affecting Rehab Potential hx of radiation, T1-3 laminectomy and posterior C7-T5 fixation   PT Frequency 2x / week   PT Duration 4 weeks   PT Treatment/Interventions Therapeutic exercise;ADLs/Self Care Home Management;Passive range of motion;Manual techniques;Patient/family education;Electrical Stimulation;Moist Heat;Dry needling   PT Next Visit Plan assess effect of current treatment and continue with moist pack and soft tissue work with biofreeze if good response after last treatment. Do Quick DASH at discharge    Consulted and Agree with Plan of Care Patient      Patient will benefit from skilled therapeutic intervention in order to improve the following deficits and impairments:  Decreased range of motion, Decreased strength, Impaired UE functional use, Postural dysfunction, Pain  Visit Diagnosis: Posture abnormality  Muscle weakness (generalized)  Stiffness of right shoulder, not elsewhere classified  Stiffness of left shoulder, not elsewhere classified       G-Codes - 2016-06-11 1220    Functional Assessment Tool Used clincial judgement    Functional Limitation Carrying, moving and handling objects   Carrying, Moving and Handling Objects Current Status [id number]) At least 20 percent but less than 40 percent impaired, limited or restricted   Carrying, Moving and Handling Objects Goal Status [id number]) At least 1 percent but less than 20 percent impaired, limited or restricted      Problem  List Patient Active Problem List   Diagnosis Date Noted  . Neuropathy ([place]) 03/27/2016  . Breast cancer of upper-outer quadrant of left female breast ([place]) 09/11/2015  . BMI  21.95 03/14/2015  . Metastasis to bone ([place]) 10/20/2014  . Genetic testing 10/19/2014  . Family history of breast cancer   . Family history of ovarian cancer   . Breast cancer metastasized to multiple sites ([place]) 08/28/2014  . Metastatic  cancer to T3 Vertebrae with Epidural Tumor displacing Spinal Cord 08/22/2014  . Medication management 07/07/2013  . Labile hypertension   . Hyperlipidemia   . Prediabetes   . Vitamin D deficiency   . Osteoporosis   . IBS (irritable bowel syndrome)    Helene Kelp K. Owens Shark PT  [place] Levo 05/30/2016, 12:21 PM  [place], [place], [address] Phone: [phone]   Fax:  [phone]  Name: JASLINE CASTELLINI MRN: [id number] Date of Birth: [birth year]

## 2016-06-05 ENCOUNTER — Ambulatory Visit: Payer: Medicare Other | Admitting: Physical Therapy

## 2016-06-05 DIAGNOSIS — M6281 Muscle weakness (generalized): Secondary | ICD-10-CM | POA: Diagnosis not present

## 2016-06-05 DIAGNOSIS — R293 Abnormal posture: Secondary | ICD-10-CM

## 2016-06-05 DIAGNOSIS — M25611 Stiffness of right shoulder, not elsewhere classified: Secondary | ICD-10-CM | POA: Diagnosis not present

## 2016-06-05 DIAGNOSIS — M25612 Stiffness of left shoulder, not elsewhere classified: Secondary | ICD-10-CM

## 2016-06-05 NOTE — Therapy (Signed)
[place], [place], [address] Phone: [phone]   Fax:  [phone]  Physical Therapy Treatment  Patient Details  Name: ALISYN LEQUIRE MRN: [id number] Date of Birth: Mar 19, 1941 Referring Provider: Dr. Isidore Moos  Encounter Date: 06/05/2016      PT End of Session - 06/05/16 1519    Visit Number 11   Number of Visits 17   Date for PT Re-Evaluation 06/24/16   PT Start Time 1430   PT Stop Time 1510   PT Time Calculation (min) 40 min   Activity Tolerance Patient limited by pain   Behavior During Therapy [facility] for tasks assessed/performed      Past Medical History:  Diagnosis Date  . Arthritis   . Breast cancer [facility]) 1985/2016   takes [name] daily  . Chronic back pain    stenosis/listhesis  . Family history of ovarian cancer   . Osteoporosis    takes Vit D  . Radiation 11/23/14-12/11/14   30 Gy T1-T5     Past Surgical History:  Procedure Laterality Date  . ABDOMINAL HYSTERECTOMY     1980  . APPENDECTOMY  1977  . APPLICATION OF INTRAOPERATIVE CT SCAN N/A 10/20/2014   Procedure: APPLICATION OF INTRAOPERATIVE CAT SCAN;  Surgeon: [name], MD;  Location: [place] [facility];  Service: Neurosurgery;  Laterality: N/A;  . BREAST SURGERY Right 1985  . THYROID CYST EXCISION  1967    There were no vitals filed for this visit.      Subjective Assessment - 06/05/16 1459    Subjective Pt has an appointment to go see Dr. Jana Hakim on Jan. 23 and she will talk to him about the pan medication and routine and proper dosage. She is still having pain most days . PT has not helped her with the cause of the pain or give the results that she wants. She continues to do home exercise as she is able   Pertinent History 1985- pt underwent right lumpectomy with 30 lymph nodes removed, 2016-left breast cancer with bony mets, pt had surgery on her back to remove a tumor in the bone in 2016, T1-3 laminectomy and posterior C7-T5  fixation, completed radiation   How long can you sit comfortably? pt finds a comfortable position sitting with weight shift and slight rotation toward left side with right shoulder hiked and pulled forward    Patient Stated Goals to stop the pain   Currently in Pain? Yes   Pain Score 1    Pain Location Scapula   Pain Descriptors / Indicators --  stinging at right scapula    Pain Type Chronic pain   Pain Onset More than a month ago   Aggravating Factors  with movement and mobility    Pain Relieving Factors moist heat                         [facility] Adult PT Treatment/Exercise - 06/05/16 0001      Self-Care   Self-Care Other Self-Care Comments   Other Self-Care Comments  discussed at length exercise options, ideas for exercise moving forward ( TaiChi at cancer center) reinforced strategies for posture correction and general exercise                 PT Education - 06/05/16 1519    Education provided Yes   Education Details continues shoulder extension and external rotation with red theraband. Consider Tai Chi exercise at [facility]  Person(s) Educated Patient   Methods Explanation   Comprehension Verbalized understanding                [facility] Goals - 06-19-16 1533      CC Long Term Goal  #1   Title Patient will be independent in a home exercise program for continued strengthening and stretching   Status Achieved     CC Long Term Goal  #2   Title Patient will report a 50% improvement in right scapular pain to allow improved comfort   Baseline 05/06/16- 5% improvement, 05/15/16- pt's progress has been up and down- some days her pain is better and others it is just as bad   Status Not Met     CC Long Term Goal  #3   Title Pt will demonstrate improved right shoulder flexion ROM to 140 degrees to allow pt to reach items on shelf   Baseline 118, 05/06/16- 125 degrees, 05/15/16- 126 degrees varies according to pain at the time   Status  Partially Met            Plan - Jun 19, 2016 1522    Clinical Impression Statement Ms. Ahlers continues to be distressed that she still is having her back pain.  PT has not been able to significantly decrease this for her and she does not want to try dry needling or TENS.  She wants to find out what is causing the pain and what can be done about it.  She plans to talk thing over with Dr. Jana Hakim at her visit in a few weeks to make sure she is taking her pain medicine right and if anything more can be done for her.    Rehab Potential Good   Clinical Impairments Affecting Rehab Potential hx of radiation, T1-3 laminectomy and posterior C7-T5 fixation   PT Treatment/Interventions Therapeutic exercise;ADLs/Self Care Home Management;Passive range of motion;Manual techniques;Patient/family education;Electrical Stimulation;Moist Heat;Dry needling   PT Next Visit Plan Discharge this  episode    PT Home Exercise Plan meeks decompression exercises; supine scapular series without resistance; lumbar stabs, red theraband for shoulder exercise and external rotation    Consulted and Agree with Plan of Care Patient      Patient will benefit from skilled therapeutic intervention in order to improve the following deficits and impairments:  Decreased range of motion, Decreased strength, Impaired UE functional use, Postural dysfunction, Pain  Visit Diagnosis: Posture abnormality  Muscle weakness (generalized)  Stiffness of right shoulder, not elsewhere classified  Stiffness of left shoulder, not elsewhere classified       G-Codes - 2016-06-19 1534    Functional Assessment Tool Used clincial judgement    Functional Limitation Carrying, moving and handling objects   Carrying, Moving and Handling Objects Goal Status (V7616) At least 1 percent but less than 20 percent impaired, limited or restricted   Carrying, Moving and Handling Objects Discharge Status [phone]) At least 20 percent but less than 40 percent  impaired, limited or restricted      Problem List Patient Active Problem List   Diagnosis Date Noted  . Neuropathy ([place]) 03/27/2016  . Breast cancer of upper-outer quadrant of left female breast ([place]) 09/11/2015  . BMI  21.95 03/14/2015  . Metastasis to bone ([place]) 10/20/2014  . Genetic testing 10/19/2014  . Family history of breast cancer   . Family history of ovarian cancer   . Breast cancer metastasized to multiple sites ([place]) 08/28/2014  . 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 Medication management 07/07/2013  . Labile hypertension   . Hyperlipidemia   . Prediabetes   . Vitamin D deficiency   . Osteoporosis   . IBS (irritable bowel syndrome)    PHYSICAL THERAPY DISCHARGE SUMMARY  Visits from Start of Care: 11  Current functional level related to goals / functional outcomes: Varies according to pain that day   Remaining deficits: Right scapular pain    Education / Equipment: Home exercise  Plan: Patient agrees to discharge.  Patient goals were partially met. Patient is being discharged due to the patient's request.  ?????    Donato Heinz. Owens Shark PT  Norwood Levo 06/05/2016, 3:35 PM  [place], [place], [address] Phone: [phone]   Fax:  [phone]  Name: SANDEEP DELAGARZA MRN: [id number] Date of Birth: [birth year]

## 2016-06-17 ENCOUNTER — Other Ambulatory Visit (HOSPITAL_BASED_OUTPATIENT_CLINIC_OR_DEPARTMENT_OTHER): Payer: Medicare Other

## 2016-06-17 ENCOUNTER — Telehealth: Payer: Self-pay | Admitting: *Deleted

## 2016-06-17 ENCOUNTER — Ambulatory Visit (HOSPITAL_BASED_OUTPATIENT_CLINIC_OR_DEPARTMENT_OTHER): Payer: Medicare Other | Admitting: Oncology

## 2016-06-17 ENCOUNTER — Ambulatory Visit (HOSPITAL_BASED_OUTPATIENT_CLINIC_OR_DEPARTMENT_OTHER): Payer: Medicare Other

## 2016-06-17 ENCOUNTER — Other Ambulatory Visit: Payer: Self-pay | Admitting: Oncology

## 2016-06-17 VITALS — BP 146/65 | HR 87 | Temp 97.7°F | Resp 17 | Ht 61.5 in | Wt 122.7 lb

## 2016-06-17 DIAGNOSIS — Z170 Estrogen receptor positive status [ER+]: Principal | ICD-10-CM

## 2016-06-17 DIAGNOSIS — E041 Nontoxic single thyroid nodule: Secondary | ICD-10-CM | POA: Diagnosis not present

## 2016-06-17 DIAGNOSIS — C50912 Malignant neoplasm of unspecified site of left female breast: Secondary | ICD-10-CM

## 2016-06-17 DIAGNOSIS — E875 Hyperkalemia: Secondary | ICD-10-CM | POA: Diagnosis not present

## 2016-06-17 DIAGNOSIS — M545 Low back pain: Secondary | ICD-10-CM

## 2016-06-17 DIAGNOSIS — Z7189 Other specified counseling: Secondary | ICD-10-CM

## 2016-06-17 DIAGNOSIS — C50412 Malignant neoplasm of upper-outer quadrant of left female breast: Secondary | ICD-10-CM | POA: Diagnosis not present

## 2016-06-17 DIAGNOSIS — C7951 Secondary malignant neoplasm of bone: Secondary | ICD-10-CM

## 2016-06-17 DIAGNOSIS — M810 Age-related osteoporosis without current pathological fracture: Secondary | ICD-10-CM

## 2016-06-17 DIAGNOSIS — C50919 Malignant neoplasm of unspecified site of unspecified female breast: Secondary | ICD-10-CM

## 2016-06-17 LAB — CBC WITH DIFFERENTIAL/PLATELET
BASO%: 0.7 % (ref 0.0–2.0)
BASOS ABS: 0 10*3/uL (ref 0.0–0.1)
EOS%: 2.2 % (ref 0.0–7.0)
Eosinophils Absolute: 0.1 10*3/uL (ref 0.0–0.5)
HEMATOCRIT: 43 % (ref 34.8–46.6)
HGB: 14.6 g/dL (ref 11.6–15.9)
LYMPH%: 27.5 % (ref 14.0–49.7)
MCH: 30.6 pg (ref 25.1–34.0)
MCHC: 34 g/dL (ref 31.5–36.0)
MCV: 90.1 fL (ref 79.5–101.0)
MONO#: 0.5 10*3/uL (ref 0.1–0.9)
MONO%: 10.5 % (ref 0.0–14.0)
NEUT#: 2.7 10*3/uL (ref 1.5–6.5)
NEUT%: 59.1 % (ref 38.4–76.8)
Platelets: 232 10*3/uL (ref 145–400)
RBC: 4.77 10*6/uL (ref 3.70–5.45)
RDW: 13.9 % (ref 11.2–14.5)
WBC: 4.6 10*3/uL (ref 3.9–10.3)
lymph#: 1.3 10*3/uL (ref 0.9–3.3)

## 2016-06-17 LAB — COMPREHENSIVE METABOLIC PANEL
ALT: 14 U/L (ref 0–55)
AST: 20 U/L (ref 5–34)
Albumin: 3.7 g/dL (ref 3.5–5.0)
Alkaline Phosphatase: 42 U/L (ref 40–150)
Anion Gap: 7 mEq/L (ref 3–11)
BUN: 14.4 mg/dL (ref 7.0–26.0)
CALCIUM: 10.9 mg/dL — AB (ref 8.4–10.4)
CHLORIDE: 105 meq/L (ref 98–109)
CO2: 28 meq/L (ref 22–29)
Creatinine: 0.7 mg/dL (ref 0.6–1.1)
EGFR: 79 mL/min/{1.73_m2} — ABNORMAL LOW (ref >90)
Glucose: 82 mg/dl (ref 70–140)
SODIUM: 140 meq/L (ref 136–145)
Total Bilirubin: 0.31 mg/dL (ref 0.20–1.20)
Total Protein: 6.9 g/dL (ref 6.4–8.3)

## 2016-06-17 MED ORDER — LETROZOLE 2.5 MG PO TABS: ORAL_TABLET | ORAL | 0 refills | Status: DC

## 2016-06-17 MED ORDER — GABAPENTIN 300 MG PO CAPS: 300.0000 mg | ORAL_CAPSULE | Freq: Every day | ORAL | 1 refills | Status: DC

## 2016-06-17 MED ORDER — DENOSUMAB 120 MG/1.7ML ~~LOC~~ SOLN
120.0000 mg | Freq: Once | SUBCUTANEOUS | Status: AC
  Administered 2016-06-17: 120 mg via SUBCUTANEOUS
  Filled 2016-06-17: qty 1.7

## 2016-06-17 NOTE — Progress Notes (Signed)
[place]  [phone] [phone]   ID: CARRA Rosales DOB: 11-24-1940  MR#: [id number]  UGQ#:[id number]  Patient Care Team: [name], MD as PCP - General (Internal Medicine) Chauncey Cruel, MD as Consulting Physician (Oncology) PCP: [name], MD GYN: SU: [name] M.D. OTHER MD:  Rodell Perna M.D., [name] MD, Stephannie Peters M.D.  CHIEF COMPLAINT: Stage IV breast cancer  CURRENT TREATMENT:  Letrozole; denosumab/Xgeva  BREAST CANCER HISTORY: From the original intake note:   April Rosales underwent right lumpectomy in 1985 at [facility] for what sounds like a stage I breast cancer. She tells me she had more than 30 lymph nodes removed from her right axilla and all of them were clear. She received  adjuvant radiation but no systemic treatment.  The patient had recently refused mammography with the last mammogram I can find dating back to August 2010.  More recently the patient presented with left scapular pain radiating down the left arm.  She was evaluated by Dr. [name], who obtained a chest x-ray showing a possible abnormality at T3. He then set up the patient for an MRI of the thoracic spine performed 08/16/2014.  This showed multiple compression fractures  (a similar picture had been noted on lumbar MRI 10/09/2011 ). However at T3 they noted tumor in the vertebral body extending into the left pedicle and into the lateral epidural space, displacing the cord to the right. There was no evidence of cord compression or cord signal abnormality. There were no other areas of tumor identified in the thoracic spine.         The patient was then referred to Dr. Hal Neer who on 08/24/2014 set April Rosales up for CT scans of the chest, abdomen and pelvis. There was a dense mass in the upper inner quadrant of the left breast measuring 1.6 cm. There was a 1.2 cm nodule in the minor fissure of the right lung and some evidence of right  lung fibrosis at the site of the prior radiation port.  There was also a thyroid mass measuring 2 cm. However there were no parenchymal lung or liver lesions. Incidental meningoceles were noted as well as sclerosis of the fifth and sixth ribs which were felt to be likely posttraumatic.   On 08/29/2014 the patient underwent bilateral diagnostic mammography with tomosynthesis and left breast ultrasonography.  There were postsurgical changes in the upper right breast. In the left breast there was an irregular mass measuring 2.3 cm in the upper inner quadrant. This was palpable. Ultrasound showed this to be hypoechoic and to measure 2.0 cm. There were adjacent areas of nodularity. There was no definite lymphadenopathy in the left axilla.  Biopsy of this breast mass 08/29/2014 showed an invasive adenocarcinoma with both ductal and lobular features (there was strong diffuse E-cadherin expression as well as areas with total absence of E-cadherin expression), with the preliminary prognostic profile showing strong estrogen positivity, very weak to near absent progesterone positivity, an MIB-1 of approximately 40%, and HER-2 equivocal  The patient's subsequent history is as detailed below.  INTERVAL HISTORY: April Rosales returns today for follow-up of her metastatic breast cancer accompanied by her sister. April Rosales is being treated with letrozole, which she generally tolerates well. Hot flashes and vaginal dryness are not a major issue. She never developed the arthralgias or myalgias that many patients can experience on this medication. She obtains it at a good price. She also receives the nostril mass/Xgeva every 4 weeks, with a dose  due today.   REVIEW OF SYSTEMS: Anelle enjoyed the holidays except she had some diarrhea right on Christmas day. She still has pain in her upper back. Of course this is where she had the prior surgery. She is gone through physical therapy but there is still something there that  "triggers" a shot of pain across her upper back towards the right. She is taking gabapentin at bedtime and it helps her sleep but she doesn't take it during the day because he did not do much she says. Otherwise she enjoys reading and has a very quiet sedentary existence, which is very stable. A detailed review of systems today was otherwise noncontributory   PAST MEDICAL HISTORY: Past Medical History:  Diagnosis Date  . Arthritis   . Breast cancer [facility] 1985/2016   takes [name] daily  . Chronic back pain    stenosis/listhesis  . Family history of ovarian cancer   . Osteoporosis    takes Vit D  . Radiation 11/23/14-12/11/14   30 Gy T1-T5     PAST SURGICAL HISTORY: Past Surgical History:  Procedure Laterality Date  . ABDOMINAL HYSTERECTOMY     1980  . APPENDECTOMY  1977  . APPLICATION OF INTRAOPERATIVE CT SCAN N/A 10/20/2014   Procedure: APPLICATION OF INTRAOPERATIVE CAT SCAN;  Surgeon: [name], MD;  Location: [place] [facility];  Service: Neurosurgery;  Laterality: N/A;  . BREAST SURGERY Right 1985  . THYROID CYST EXCISION  1967    FAMILY HISTORY Family History  Problem Relation Age of Onset  . Heart disease Mother   . Hypertension Mother   . Heart disease Father   . Diabetes Father   . Breast cancer Paternal Aunt     4 paternal aunts with breast cancer over 7  . Prostate cancer Paternal Uncle   . Stroke Paternal Grandfather   . Ovarian cancer Paternal Aunt   . Huntington's disease Other     Nephew, inherited from his father   The patient's father died from a heart attack at the age of 28. He had 9 sisters. 3 of those sisters had breast cancer, all in a menopausal setting. Another sister had ovarian cancer. One of the paternal uncles had cancer of the colon "and back".  The patient's mother died at the age of 58. She was found to have breast cancer shortly before dying , during her final hospitalization.  GYNECOLOGIC HISTORY:  No LMP recorded. Patient has had a  hysterectomy.  Menarche age 32, first live birth age 86, the patient is GX P1. She underwent hysterectomy in 1980. She thinks the ovaries were removed, but the CT scan obtained 08/24/2014 showed a definite right ovary. The left ovary may have been removed. She did not take hormone replacement after the hysterectomy.  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 She is divorced, lives alone, with 2 cats. Her son Arnette Norris sharp lives in [place] where he works in Engineer, technical sales. He has 4 children of his own. The patient is a Psychologist, forensic.    ADVANCED DIRECTIVES:  In place. The patient has named her sister Pleas Koch [phone]) and her friend Janina Mayo [phone]) as joint healthcare powers of attorney   HEALTH MAINTENANCE: Social History  Substance Use Topics  . Smoking status: Former Smoker    Packs/day: 0.50    Years: 10.00    Types: Cigarettes  . Smokeless tobacco: Former Systems developer     Comment: quit smoking 1970's  . Alcohol use 0.0 oz/week  Comment: Rare     Colonoscopy: never  PAP: status post hysterectomy  Bone density: 08/14/2014 at [facility], results pending  Lipid panel:  Allergies  Allergen Reactions  . Ativan [Lorazepam]     Made her crazy  . Dilaudid [Hydromorphone Hcl]     Doesn't want  . Haldol [Haloperidol Lactate]     Made her crazy    Current Outpatient Prescriptions  Medication Sig Dispense Refill  . Ascorbic Acid (VITAMIN C PO) Take 1 tablet by mouth every other day. Takes qod    . aspirin EC 325 MG tablet Take 1 tablet (325 mg total) by mouth daily. 30 tablet 0  . calcium carbonate (TUMS) 500 MG chewable tablet Chew 2 tablets by mouth 2 (two) times daily.     . Cholecalciferol (VITAMIN D3 ADULT GUMMIES PO) Take 4,000 Units by mouth daily.    . Cyanocobalamin (VITAMIN B-12 PO) Take by mouth daily. Takes every other day    . gabapentin (NEURONTIN) 100 MG capsule Continue taking your 363m tab at night; take 100-3024monce during the daytime as  needed for pain. 90 capsule 5  . gabapentin (NEURONTIN) 300 MG capsule Take 1 capsule (300 mg total) by mouth at bedtime. 30 capsule 1  . Iron-Vitamins (GERITOL PO) Take by mouth daily.    . Marland Kitchenetrozole (FEMARA) 2.5 MG tablet TAKE 1 TABLET(2.5 MG) BY MOUTH DAILY 90 tablet 0  . nabumetone (RELAFEN) 500 MG tablet TK 1 T PO BID  3   No current facility-administered medications for this visit.     OBJECTIVE:  Older white woman Would appear stated age  Vi47  06/17/16 1118  BP: (!) 146/65  Pulse: 87  Resp: 17  Temp: 97.7 F (36.5 C)     Body mass index is 22.81 kg/m.    ECOG FS:1 - Symptomatic but completely ambulatory  Sclerae unicteric, EOMs intact Oropharynx clear and moist No cervical or supraclavicular adenopathy Lungs no rales or rhonchi Heart regular rate and rhythm Abd soft, nontender, positive bowel sounds MSK no upper extremity lymphedema; significant kyphosis and some deformity in the upper thoracic spine secondary to prior surgery. This is stable. I do not locate a trigger point by palpation Neuro: nonfocal, well oriented, appropriate affect Breasts: The right breast is status post prior lumpectomy with no evidence of local recurrence. The right axilla is benign. The left breast is unremarkable   LAB RESULTS:  CMP     Component Value Date/Time   NA 140 05/20/2016 1109   K 4.7 Sl. Hemolysis 05/20/2016 1109   CL 103 10/13/2014 1427   CO2 23 05/20/2016 1109   GLUCOSE 82 05/20/2016 1109   BUN 16.5 05/20/2016 1109   CREATININE 0.7 05/20/2016 1109   CALCIUM 10.5 (H) 05/20/2016 1109   PROT 7.3 05/20/2016 1109   ALBUMIN 3.7 05/20/2016 1109   AST 26 05/20/2016 1109   ALT 18 05/20/2016 1109   ALKPHOS 38 (L) 05/20/2016 1109   BILITOT 0.37 05/20/2016 1109   GFRNONAA >60 10/13/2014 1427   GFRNONAA >89 08/21/2014 1713   GFRAA >60 10/13/2014 1427   GFRAA >89 08/21/2014 1713    INo results found for: SPEP, UPEP  Lab Results  Component Value Date   WBC 4.6  06/17/2016   NEUTROABS 2.7 06/17/2016   HGB 14.6 06/17/2016   HCT 43.0 06/17/2016   MCV 90.1 06/17/2016   PLT 232 06/17/2016      Chemistry      Component Value Date/Time   NA  140 05/20/2016 1109   K 4.7 Sl. Hemolysis 05/20/2016 1109   CL 103 10/13/2014 1427   CO2 23 05/20/2016 1109   BUN 16.5 05/20/2016 1109   CREATININE 0.7 05/20/2016 1109      Component Value Date/Time   CALCIUM 10.5 (H) 05/20/2016 1109   ALKPHOS 38 (L) 05/20/2016 1109   AST 26 05/20/2016 1109   ALT 18 05/20/2016 1109   BILITOT 0.37 05/20/2016 1109       Lab Results  Component Value Date   LABCA2 24 01/01/2016    No components found for: [id number]  No results for input(s): INR in the last 168 hours.  Urinalysis    Component Value Date/Time   COLORURINE YELLOW 09/19/2015 1239   APPEARANCEUR CLOUDY (A) 09/19/2015 1239   LABSPEC 1.015 09/19/2015 1239   PHURINE 7.5 09/19/2015 1239   GLUCOSEU NEGATIVE 09/19/2015 1239   HGBUR NEGATIVE 09/19/2015 1239   BILIRUBINUR NEGATIVE 09/19/2015 1239   KETONESUR NEGATIVE 09/19/2015 1239   PROTEINUR NEGATIVE 09/19/2015 1239   NITRITE NEGATIVE 09/19/2015 1239   LEUKOCYTESUR NEGATIVE 09/19/2015 1239    STUDIES:No results found.    ASSESSMENT: 76 y.o. [facility] woman with stage IV left breast cancer involving bone  (1) status post right lumpectomy and axillary node dissection in 1985 followed by radiation at [facility] 08/16/2014: measurable disease in spine, lung and left breast   (2) evaluation for left shoulder pain led to thoracic spine MRI 08/16/2014 showing a pathologic fracture at T3 with epidural tumor displacing the cord to the right, but no cord compression. CT scans of the chest, abdomen and pelvis 08/24/2014 showed in addition a mass in the upper outer quadrant left breast measuring 1.6 cm and a nodule in the minor fissure of the right lung measuring 1.2 cm, but no parenchymal lung or liver lesions. CA 27-29 was noninformative at 38  (09/20/2014)    (3) mammography and ultrasonography 08/29/2014 show a mass in the upper inner left breast which was palpable,  measuring 2.0 cm by ultrasound. Biopsy of this mass 08/29/2014 showed an invasive breast cancer with both lobular and ductal features, estrogen receptor positive, progesterone receptor weakly positive, with an MIB-1 in the 40% range, HER-2 equivocal (6 else ratio 1.5, but average number her nucleus 5.8)   (4) letrozole started 08/31/2014;   (a) palbociclib added Sept 2016 at 75 mg 21/7, with significant neutropenia; not repeated after first cycle   (5)  zolendronate started 09/20/2014, stopped after initial dose due to poor tolerance  (a) denosumab/ Xgeva started 01/31/2015, repeated every 4 weeks. Moved to every 8 weeks starting 07/17/15.    (6) on 10/20/2014 the patient underwent T2-T3 and T4 decompressive laminectomy with removal of epidural tumor, C7-T4 segmental pedicle screw instrumentation with virage screw system with arrow guidance protocol and C7-T4 posterolateral fusion. The cells were positive for the estrogen receptor. HER-2/neu testing by [facility] showed again equivocal results,  (7) radiation 11/23/2014-12/11/2014.  (a) T1-T5 was treated to 30 Gy in 12 fractions at 2.5 Gy per fraction   (8)  consider eventual left lumpectomy or mastectomy depending on  longer-term results of systemic therapy  (9) genetics testing using the Breast/Ovarian Cancer Panel through [name], MD) found no mutations in ATM, BARD1, BRCA1, BRCA2, BRIP1, CDH1, CHEK2, EPCAM, FANCC, MLH1, MSH2, MSH6, NBN, PALB2, PMS2, PTEN, RAD51C, RAD51D, STK11, TP53, or XRCC2    (10) right thyroid nodule is a complex cyst as noted on CT scan of the neck 01/29/2016  PLAN: April Rosales  is tolerating her letrozole and denosumab/Xgeva remarkably well. As best as I can tell her breast cancer is very stable on these medications. She does have chronic pain related to her prior neck surgery. The physical  therapy was helpful but did not really resolve the issue and she is reluctant to take more gabapentin because of confusion.  I'm going to see if Dr. Maryjean Ka in the neurosurgery office can see her for this.  She will be due for repeat mammography in April. I am adding a bone density together with those orders.  I'm going to see her again in May and I will  alsodo a bone scan shortly before that visit.  Incidentally her potassium today was read as high. She is not on potassium supplementation or in any medication that I can see that would cause this. Her renal function is good. Also believe this was due to hemolysis. In any case we will repeat her labs standard 20 11/12/2016 distal make sure there is no issue there.   In general however I am pleased at how well she is doing overall. She knows to call for any problems that may develop before her next visit here.  Chauncey Cruel, MD   06/17/2016 11:20 AM .

## 2016-06-17 NOTE — Telephone Encounter (Signed)
This RN contacted pt per MD review of potassium of 5.7.  [name] verified she is not on any prescription nor supplemental potassium.  Diet has not changed.  Per discussion, April Rosales states she has not been drinking well for hydration.  Plan is pt will increase her fluid intake over the next 2 days and return to this clinic on Friday 1/26 for repeat lab.

## 2016-06-17 NOTE — Patient Instructions (Signed)
Denosumab injection What is this medicine? DENOSUMAB (den oh sue mab) slows bone breakdown. Prolia is used to treat osteoporosis in women after menopause and in men. Xgeva is used to prevent bone fractures and other bone problems caused by cancer bone metastases. Xgeva is also used to treat giant cell tumor of the bone. This medicine may be used for other purposes; ask your health care provider or pharmacist if you have questions. COMMON BRAND NAME(S): Prolia, XGEVA What should I tell my health care provider before I take this medicine? They need to know if you have any of these conditions: -dental disease -eczema -infection or history of infections -kidney disease or on dialysis -low blood calcium or vitamin D -malabsorption syndrome -scheduled to have surgery or tooth extraction -taking medicine that contains denosumab -thyroid or parathyroid disease -an unusual reaction to denosumab, other medicines, foods, dyes, or preservatives -pregnant or trying to get pregnant -breast-feeding How should I use this medicine? This medicine is for injection under the skin. It is given by a health care professional in a hospital or clinic setting. If you are getting Prolia, a special MedGuide will be given to you by the pharmacist with each prescription and refill. Be sure to read this information carefully each time. For Prolia, talk to your pediatrician regarding the use of this medicine in children. Special care may be needed. For Xgeva, talk to your pediatrician regarding the use of this medicine in children. While this drug may be prescribed for children as young as 13 years for selected conditions, precautions do apply. Overdosage: If you think you've taken too much of this medicine contact a poison control center or emergency room at once. Overdosage: If you think you have taken too much of this medicine contact a poison control center or emergency room at once. NOTE: This medicine is only for  you. Do not share this medicine with others. What if I miss a dose? It is important not to miss your dose. Call your doctor or health care professional if you are unable to keep an appointment. What may interact with this medicine? Do not take this medicine with any of the following medications: -other medicines containing denosumab This medicine may also interact with the following medications: -medicines that suppress the immune system -medicines that treat cancer -steroid medicines like prednisone or cortisone This list may not describe all possible interactions. Give your health care provider a list of all the medicines, herbs, non-prescription drugs, or dietary supplements you use. Also tell them if you smoke, drink alcohol, or use illegal drugs. Some items may interact with your medicine. What should I watch for while using this medicine? Visit your doctor or health care professional for regular checks on your progress. Your doctor or health care professional may order blood tests and other tests to see how you are doing. Call your doctor or health care professional if you get a cold or other infection while receiving this medicine. Do not treat yourself. This medicine may decrease your body's ability to fight infection. You should make sure you get enough calcium and vitamin D while you are taking this medicine, unless your doctor tells you not to. Discuss the foods you eat and the vitamins you take with your health care professional. See your dentist regularly. Brush and floss your teeth as directed. Before you have any dental work done, tell your dentist you are receiving this medicine. Do not become pregnant while taking this medicine or for 5 months after stopping   it. Women should inform their doctor if they wish to become pregnant or think they might be pregnant. There is a potential for serious side effects to an unborn child. Talk to your health care professional or pharmacist for more  information. What side effects may I notice from receiving this medicine? Side effects that you should report to your doctor or health care professional as soon as possible: -allergic reactions like skin rash, itching or hives, swelling of the face, lips, or tongue -breathing problems -chest pain -fast, irregular heartbeat -feeling faint or lightheaded, falls -fever, chills, or any other sign of infection -muscle spasms, tightening, or twitches -numbness or tingling -skin blisters or bumps, or is dry, peels, or red -slow healing or unexplained pain in the mouth or jaw -unusual bleeding or bruising Side effects that usually do not require medical attention (Report these to your doctor or health care professional if they continue or are bothersome.): -muscle pain -stomach upset, gas This list may not describe all possible side effects. Call your doctor for medical advice about side effects. You may report side effects to FDA at 1-800-FDA-1088. Where should I keep my medicine? This medicine is only given in a clinic, doctor's office, or other health care setting and will not be stored at home. NOTE: This sheet is a summary. It may not cover all possible information. If you have questions about this medicine, talk to your doctor, pharmacist, or health care provider.  2015, Elsevier/Gold Standard. (2011-11-10 12:37:47)  

## 2016-06-18 ENCOUNTER — Other Ambulatory Visit: Payer: Self-pay | Admitting: Emergency Medicine

## 2016-06-18 ENCOUNTER — Encounter: Payer: Self-pay | Admitting: Oncology

## 2016-06-18 DIAGNOSIS — C50912 Malignant neoplasm of unspecified site of left female breast: Secondary | ICD-10-CM

## 2016-06-18 DIAGNOSIS — C7951 Secondary malignant neoplasm of bone: Secondary | ICD-10-CM

## 2016-06-18 LAB — CANCER ANTIGEN 27.29: CA 27.29: 27.6 U/mL (ref 0.0–38.6)

## 2016-06-20 ENCOUNTER — Other Ambulatory Visit (HOSPITAL_BASED_OUTPATIENT_CLINIC_OR_DEPARTMENT_OTHER): Payer: Medicare Other

## 2016-06-20 ENCOUNTER — Telehealth: Payer: Self-pay | Admitting: *Deleted

## 2016-06-20 DIAGNOSIS — C50912 Malignant neoplasm of unspecified site of left female breast: Secondary | ICD-10-CM

## 2016-06-20 DIAGNOSIS — C50412 Malignant neoplasm of upper-outer quadrant of left female breast: Secondary | ICD-10-CM

## 2016-06-20 DIAGNOSIS — C7951 Secondary malignant neoplasm of bone: Secondary | ICD-10-CM | POA: Diagnosis not present

## 2016-06-20 LAB — COMPREHENSIVE METABOLIC PANEL
ALT: 15 U/L (ref 0–55)
ANION GAP: 8 meq/L (ref 3–11)
AST: 21 U/L (ref 5–34)
Albumin: 3.9 g/dL (ref 3.5–5.0)
Alkaline Phosphatase: 43 U/L (ref 40–150)
BUN: 14.6 mg/dL (ref 7.0–26.0)
CALCIUM: 10.8 mg/dL — AB (ref 8.4–10.4)
CHLORIDE: 106 meq/L (ref 98–109)
CO2: 28 meq/L (ref 22–29)
CREATININE: 0.7 mg/dL (ref 0.6–1.1)
EGFR: 84 mL/min/{1.73_m2} — ABNORMAL LOW (ref >90)
Glucose: 93 mg/dl (ref 70–140)
Sodium: 142 mEq/L (ref 136–145)
Total Bilirubin: 0.46 mg/dL (ref 0.20–1.20)
Total Protein: 6.9 g/dL (ref 6.4–8.3)

## 2016-06-20 MED ORDER — HYDROCHLOROTHIAZIDE 12.5 MG PO CAPS: 12.5000 mg | ORAL_CAPSULE | Freq: Every day | ORAL | 0 refills | Status: DC

## 2016-06-20 NOTE — Telephone Encounter (Signed)
This RN spoke with pt per review of labs obtained today with potassium decreased to 5.2.  Discussed with pt MD recommendation to hold her geritol ( noted potassium chloride ) and pt to start [place].  Pt will come in next Thursday for lab recheck.  April Rosales verbalized understanding of the above.

## 2016-06-24 ENCOUNTER — Telehealth: Payer: Self-pay | Admitting: Emergency Medicine

## 2016-06-24 NOTE — Telephone Encounter (Signed)
Spoke with Anderson Malta at Dr Eddie Dibbles [facility]'s office; referral made for her to see him as next available; patient is established at that office; last office note faxed to their office.

## 2016-06-26 ENCOUNTER — Other Ambulatory Visit (HOSPITAL_BASED_OUTPATIENT_CLINIC_OR_DEPARTMENT_OTHER): Payer: Medicare Other

## 2016-06-26 DIAGNOSIS — C7951 Secondary malignant neoplasm of bone: Secondary | ICD-10-CM | POA: Diagnosis not present

## 2016-06-26 DIAGNOSIS — C50412 Malignant neoplasm of upper-outer quadrant of left female breast: Secondary | ICD-10-CM

## 2016-06-26 DIAGNOSIS — C50912 Malignant neoplasm of unspecified site of left female breast: Secondary | ICD-10-CM

## 2016-06-26 LAB — COMPREHENSIVE METABOLIC PANEL
ALBUMIN: 3.7 g/dL (ref 3.5–5.0)
ALK PHOS: 42 U/L (ref 40–150)
ALT: 14 U/L (ref 0–55)
ANION GAP: 6 meq/L (ref 3–11)
AST: 19 U/L (ref 5–34)
BUN: 12.9 mg/dL (ref 7.0–26.0)
CALCIUM: 10.3 mg/dL (ref 8.4–10.4)
CO2: 31 mEq/L — ABNORMAL HIGH (ref 22–29)
Chloride: 105 mEq/L (ref 98–109)
Creatinine: 0.7 mg/dL (ref 0.6–1.1)
EGFR: 85 mL/min/{1.73_m2} — AB (ref >90)
Glucose: 82 mg/dl (ref 70–140)
POTASSIUM: 4.9 meq/L (ref 3.5–5.1)
Sodium: 141 mEq/L (ref 136–145)
Total Bilirubin: 0.34 mg/dL (ref 0.20–1.20)
Total Protein: 6.8 g/dL (ref 6.4–8.3)

## 2016-07-07 ENCOUNTER — Ambulatory Visit (INDEPENDENT_AMBULATORY_CARE_PROVIDER_SITE_OTHER): Payer: Medicare Other | Admitting: Physician Assistant

## 2016-07-07 ENCOUNTER — Encounter: Payer: Self-pay | Admitting: Physician Assistant

## 2016-07-07 VITALS — BP 138/72 | HR 102 | Temp 97.3°F | Resp 14 | Ht 61.5 in | Wt 122.6 lb

## 2016-07-07 DIAGNOSIS — R197 Diarrhea, unspecified: Secondary | ICD-10-CM

## 2016-07-07 DIAGNOSIS — R509 Fever, unspecified: Secondary | ICD-10-CM

## 2016-07-07 LAB — CBC WITH DIFFERENTIAL/PLATELET
BASOS PCT: 1 %
Basophils Absolute: 46 cells/uL (ref 0–200)
EOS ABS: 0 {cells}/uL — AB (ref 15–500)
Eosinophils Relative: 0 %
HEMATOCRIT: 45.6 % — AB (ref 35.0–45.0)
Hemoglobin: 15.1 g/dL (ref 11.7–15.5)
LYMPHS PCT: 11 %
Lymphs Abs: 506 cells/uL — ABNORMAL LOW (ref 850–3900)
MCH: 30.6 pg (ref 27.0–33.0)
MCHC: 33.1 g/dL (ref 32.0–36.0)
MCV: 92.5 fL (ref 80.0–100.0)
MONO ABS: 552 {cells}/uL (ref 200–950)
MPV: 9.5 fL (ref 7.5–12.5)
Monocytes Relative: 12 %
Neutro Abs: 3496 cells/uL (ref 1500–7800)
Neutrophils Relative %: 76 %
Platelets: 244 10*3/uL (ref 140–400)
RBC: 4.93 MIL/uL (ref 3.80–5.10)
RDW: 14.1 % (ref 11.0–15.0)
WBC: 4.6 10*3/uL (ref 3.8–10.8)

## 2016-07-07 LAB — BASIC METABOLIC PANEL WITH GFR
BUN: 12 mg/dL (ref 7–25)
CHLORIDE: 105 mmol/L (ref 98–110)
CO2: 30 mmol/L (ref 20–31)
Calcium: 10.8 mg/dL — ABNORMAL HIGH (ref 8.6–10.4)
Creat: 0.67 mg/dL (ref 0.60–0.93)
GFR, Est Non African American: 85 mL/min (ref >=60)
Glucose, Bld: 96 mg/dL (ref 65–99)
POTASSIUM: 5 mmol/L (ref 3.5–5.3)
Sodium: 144 mmol/L (ref 135–146)

## 2016-07-07 LAB — HEPATIC FUNCTION PANEL
ALBUMIN: 4.2 g/dL (ref 3.6–5.1)
ALK PHOS: 40 U/L (ref 33–130)
ALT: 14 U/L (ref 6–29)
AST: 20 U/L (ref 10–35)
Bilirubin, Direct: 0.1 mg/dL (ref <=0.2)
Indirect Bilirubin: 0.3 mg/dL (ref 0.2–1.2)
TOTAL PROTEIN: 7.1 g/dL (ref 6.1–8.1)
Total Bilirubin: 0.4 mg/dL (ref 0.2–1.2)

## 2016-07-07 MED ORDER — AMOXICILLIN-POT CLAVULANATE 875-125 MG PO TABS: 1.0000 | ORAL_TABLET | Freq: Two times a day (BID) | ORAL | 0 refills | Status: DC

## 2016-07-07 NOTE — Progress Notes (Signed)
Subjective:    Patient ID: April Rosales, female    DOB: August 14, 1940, 76 y.o.   MRN: [id number]  HPI 76 y.o. WF with history of breast cancer x 2016 on letrazole and follows with Dr. Jana Hakim presents with diarrhea x 4, took pepto that helped then this AM soft stool, ST x yesterday used salt water and magic mouth wash, and low grade temp at home x Sunday. Muscle aches, weakness, runny nose, no nausea, vomiting. She has never had a colonoscopy.  No known sick contacts.   Blood pressure 138/72, pulse (!) 102, temperature 97.3 F (36.3 C), resp. rate 14, height 5' 1.5" (1.562 m), weight 122 lb 9.6 oz (55.6 kg), SpO2 97 %.  Medications Current Outpatient Prescriptions on File Prior to Visit  Medication Sig  . Ascorbic Acid (VITAMIN C PO) Take 1 tablet by mouth every other day. Takes qod  . aspirin EC 325 MG tablet Take 1 tablet (325 mg total) by mouth daily.  . Cholecalciferol (VITAMIN D3 ADULT GUMMIES PO) Take 4,000 Units by mouth daily.  . Cyanocobalamin (VITAMIN B-12 PO) Take by mouth daily. Takes every other day  . gabapentin (NEURONTIN) 300 MG capsule Take 1 capsule (300 mg total) by mouth at bedtime.  . hydrochlorothiazide (MICROZIDE) 12.5 MG capsule Take 1 capsule (12.5 mg total) by mouth daily.  Marland Kitchen letrozole (FEMARA) 2.5 MG tablet TAKE 1 TABLET(2.5 MG) BY MOUTH DAILY   No current facility-administered medications on file prior to visit.     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  Review of Systems  Constitutional: Positive for fatigue and fever. Negative for chills and diaphoresis.  HENT: Positive for congestion, rhinorrhea and sore throat. Negative for sinus pain, sinus pressure, sneezing, tinnitus, trouble swallowing and voice change.   Respiratory: Positive for cough. Negative for apnea, choking, chest tightness, shortness of breath, wheezing and stridor.   Cardiovascular: Negative.   Gastrointestinal: Positive for abdominal pain and diarrhea. Negative for abdominal distention, anal bleeding, blood in stool, constipation, nausea, rectal pain and vomiting.  Genitourinary: Negative.   Musculoskeletal: Positive for arthralgias. Negative for back pain, gait problem, joint swelling, myalgias, neck pain and neck stiffness.  Skin: Negative.   Neurological: Positive for dizziness. Negative for headaches.  Hematological: Negative.        Objective:   Physical Exam  Constitutional: She is oriented to person, place, and time. She appears well-developed and well-nourished.  Neck: Normal range of motion. Neck supple.  Cardiovascular: Normal rate and regular rhythm.   Pulmonary/Chest: Effort normal and breath sounds normal.  Abdominal: Soft. Bowel sounds are normal. She exhibits no mass. There is tenderness in the left lower quadrant. There is no rigidity, no rebound, no guarding, no CVA tenderness, no tenderness at McBurney's point and negative Murphy's sign. No hernia.  Neurological: She is alert and oriented to person, place, and time.  Skin: Skin is warm and dry. No rash noted.  Vitals reviewed.     Assessment & Plan:   Diarrhea, unspecified type ? norovirus but with LLQ pain will treat as  possible diverticulitis, get labs, no rebound, bland diet, if any worsening pain will go to ER -     CBC with Differential/Platelet -     BASIC METABOLIC PANEL WITH GFR -     Hepatic function panel -     amoxicillin-clavulanate  (AUGMENTIN) 875-125 MG tablet; Take 1 tablet by mouth 2 (two) times daily. 7 days  Fever, unspecified fever cause -     amoxicillin-clavulanate (AUGMENTIN) 875-125 MG tablet; Take 1 tablet by mouth 2 (two) times daily. 7 days  Future Appointments Date Time Provider [place]  07/15/2016 11:00 AM [facility] LAB 1 [facility] None  07/15/2016 11:30 AM [facility] INJ NURSE [facility] None  08/12/2016 11:00 AM [facility] LAB 5 [facility] None  08/12/2016 11:30 AM [facility] INJ NURSE [facility] None  09/09/2016 11:00 AM [facility] LAB 5 [facility] None  09/09/2016 11:30 AM [facility] INJ NURSE [facility] None  10/07/2016 12:30 PM [facility] ONLY [facility] None  10/07/2016 1:00 PM Chauncey Cruel, MD [facility] None  10/07/2016 1:30 PM [facility] INJ NURSE [facility] None  10/16/2016 9:00 AM [name], MD [facility] None

## 2016-07-07 NOTE — Patient Instructions (Signed)
Diverticulitis Diverticulitis is when small pockets that have formed in your colon (large intestine) become infected or swollen. Follow these instructions at home:  Follow your doctor's instructions.  Follow a special diet if told by your doctor.  When you feel better, your doctor may tell you to change your diet. You may be told to eat a lot of fiber. Fruits and vegetables are good sources of fiber. Fiber makes it easier to poop (have bowel movements).  Take supplements or probiotics as told by your doctor.  Only take medicines as told by your doctor.  Keep all follow-up visits with your doctor. Contact a doctor if:  Your pain does not get better.  You have a hard time eating food.  You are not pooping like normal. Get help right away if:  Your pain gets worse.  Your problems do not get better.  Your problems suddenly get worse.  You have a fever.  You keep throwing up (vomiting).  You have bloody or black, tarry poop (stool). This information is not intended to replace advice given to you by your health care provider. Make sure you discuss any questions you have with your health care provider. Document Released: 10/29/2007 Document Revised: 10/18/2015 Document Reviewed: 04/06/2013 Elsevier Interactive Patient Education  2017 [employer].    Influenza, Adult Influenza ("the flu") is an infection in the lungs, nose, and throat (respiratory tract). It is caused by a virus. The flu causes many common cold symptoms, as well as a high fever and body aches. It can make you feel very sick. The flu spreads easily from person to person (is contagious). Getting a flu shot (influenza vaccination) every year is the best way to prevent the flu. Follow these instructions at home:  Take over-the-counter and prescription medicines only as told by your doctor.  Use a cool mist humidifier to add moisture (humidity) to the air in your home. This can make it easier to breathe.  Rest  as needed.  Drink enough fluid to keep your pee (urine) clear or pale yellow.  Cover your mouth and nose when you cough or sneeze.  Wash your hands with soap and water often, especially after you cough or sneeze. If you cannot use soap and water, use hand sanitizer.  Stay home from work or school as told by your doctor. Unless you are visiting your doctor, try to avoid leaving home until your fever has been gone for 24 hours without the use of medicine.  Keep all follow-up visits as told by your doctor. This is important. How is this prevented?  Getting a yearly (annual) flu shot is the best way to avoid getting the flu. You may get the flu shot in late summer, fall, or winter. Ask your doctor when you should get your flu shot.  Wash your hands often or use hand sanitizer often.  Avoid contact with people who are sick during cold and flu season.  Eat healthy foods.  Drink plenty of fluids.  Get enough sleep.  Exercise regularly. Contact a doctor if:  You get new symptoms.  You have:  Chest pain.  Watery poop (diarrhea).  A fever.  Your cough gets worse.  You start to have more mucus.  You feel sick to your stomach (nauseous).  You throw up (vomit). Get help right away if:  You start to be short of breath or have trouble breathing.  Your skin or nails turn a bluish color.  You have very bad pain or  stiffness in your neck.  You get a sudden headache.  You get sudden pain in your face or ear.  You cannot stop throwing up. This information is not intended to replace advice given to you by your health care provider. Make sure you discuss any questions you have with your health care provider. Document Released: 02/19/2008 Document Revised: 10/18/2015 Document Reviewed: 03/06/2015 Elsevier Interactive Patient Education  2017 [employer].

## 2016-07-08 NOTE — Progress Notes (Signed)
Pt aware of lab results & voiced understanding of those results. Pt states she does feel a little bit better today just has a lot of nasal discharge.

## 2016-07-15 ENCOUNTER — Other Ambulatory Visit (HOSPITAL_BASED_OUTPATIENT_CLINIC_OR_DEPARTMENT_OTHER): Payer: Medicare Other

## 2016-07-15 ENCOUNTER — Ambulatory Visit (HOSPITAL_BASED_OUTPATIENT_CLINIC_OR_DEPARTMENT_OTHER): Payer: Medicare Other

## 2016-07-15 VITALS — BP 145/74 | HR 82 | Temp 98.0°F | Resp 18

## 2016-07-15 DIAGNOSIS — C7951 Secondary malignant neoplasm of bone: Secondary | ICD-10-CM

## 2016-07-15 DIAGNOSIS — C50912 Malignant neoplasm of unspecified site of left female breast: Secondary | ICD-10-CM

## 2016-07-15 DIAGNOSIS — C50412 Malignant neoplasm of upper-outer quadrant of left female breast: Secondary | ICD-10-CM | POA: Diagnosis not present

## 2016-07-15 DIAGNOSIS — C50919 Malignant neoplasm of unspecified site of unspecified female breast: Secondary | ICD-10-CM

## 2016-07-15 DIAGNOSIS — M810 Age-related osteoporosis without current pathological fracture: Secondary | ICD-10-CM

## 2016-07-15 LAB — COMPREHENSIVE METABOLIC PANEL
ALBUMIN: 3.4 g/dL — AB (ref 3.5–5.0)
ALT: 21 U/L (ref 0–55)
AST: 24 U/L (ref 5–34)
Alkaline Phosphatase: 42 U/L (ref 40–150)
Anion Gap: 9 mEq/L (ref 3–11)
BUN: 14.8 mg/dL (ref 7.0–26.0)
CHLORIDE: 104 meq/L (ref 98–109)
CO2: 29 mEq/L (ref 22–29)
CREATININE: 0.7 mg/dL (ref 0.6–1.1)
Calcium: 10.3 mg/dL (ref 8.4–10.4)
EGFR: 84 mL/min/{1.73_m2} — ABNORMAL LOW (ref >90)
Glucose: 81 mg/dl (ref 70–140)
POTASSIUM: 4.1 meq/L (ref 3.5–5.1)
SODIUM: 141 meq/L (ref 136–145)
Total Bilirubin: 0.31 mg/dL (ref 0.20–1.20)
Total Protein: 6.9 g/dL (ref 6.4–8.3)

## 2016-07-15 LAB — CBC WITH DIFFERENTIAL/PLATELET
BASO%: 0.2 % (ref 0.0–2.0)
BASOS ABS: 0 10*3/uL (ref 0.0–0.1)
EOS%: 1.5 % (ref 0.0–7.0)
Eosinophils Absolute: 0.1 10*3/uL (ref 0.0–0.5)
HEMATOCRIT: 42.1 % (ref 34.8–46.6)
HEMOGLOBIN: 14.4 g/dL (ref 11.6–15.9)
LYMPH#: 0.9 10*3/uL (ref 0.9–3.3)
LYMPH%: 16.1 % (ref 14.0–49.7)
MCH: 31.1 pg (ref 25.1–34.0)
MCHC: 34.2 g/dL (ref 31.5–36.0)
MCV: 91.1 fL (ref 79.5–101.0)
MONO#: 0.4 10*3/uL (ref 0.1–0.9)
MONO%: 7.3 % (ref 0.0–14.0)
NEUT#: 4 10*3/uL (ref 1.5–6.5)
NEUT%: 74.9 % (ref 38.4–76.8)
Platelets: 245 10*3/uL (ref 145–400)
RBC: 4.63 10*6/uL (ref 3.70–5.45)
RDW: 13.3 % (ref 11.2–14.5)
WBC: 5.3 10*3/uL (ref 3.9–10.3)

## 2016-07-15 MED ORDER — DENOSUMAB 120 MG/1.7ML ~~LOC~~ SOLN
120.0000 mg | Freq: Once | SUBCUTANEOUS | Status: AC
  Administered 2016-07-15: 120 mg via SUBCUTANEOUS
  Filled 2016-07-15: qty 1.7

## 2016-07-15 NOTE — Patient Instructions (Signed)
Denosumab injection What is this medicine? DENOSUMAB (den oh sue mab) slows bone breakdown. Prolia is used to treat osteoporosis in women after menopause and in men. Xgeva is used to prevent bone fractures and other bone problems caused by cancer bone metastases. Xgeva is also used to treat giant cell tumor of the bone. This medicine may be used for other purposes; ask your health care provider or pharmacist if you have questions. COMMON BRAND NAME(S): Prolia, XGEVA What should I tell my health care provider before I take this medicine? They need to know if you have any of these conditions: -dental disease -eczema -infection or history of infections -kidney disease or on dialysis -low blood calcium or vitamin D -malabsorption syndrome -scheduled to have surgery or tooth extraction -taking medicine that contains denosumab -thyroid or parathyroid disease -an unusual reaction to denosumab, other medicines, foods, dyes, or preservatives -pregnant or trying to get pregnant -breast-feeding How should I use this medicine? This medicine is for injection under the skin. It is given by a health care professional in a hospital or clinic setting. If you are getting Prolia, a special MedGuide will be given to you by the pharmacist with each prescription and refill. Be sure to read this information carefully each time. For Prolia, talk to your pediatrician regarding the use of this medicine in children. Special care may be needed. For Xgeva, talk to your pediatrician regarding the use of this medicine in children. While this drug may be prescribed for children as young as 13 years for selected conditions, precautions do apply. Overdosage: If you think you've taken too much of this medicine contact a poison control center or emergency room at once. Overdosage: If you think you have taken too much of this medicine contact a poison control center or emergency room at once. NOTE: This medicine is only for  you. Do not share this medicine with others. What if I miss a dose? It is important not to miss your dose. Call your doctor or health care professional if you are unable to keep an appointment. What may interact with this medicine? Do not take this medicine with any of the following medications: -other medicines containing denosumab This medicine may also interact with the following medications: -medicines that suppress the immune system -medicines that treat cancer -steroid medicines like prednisone or cortisone This list may not describe all possible interactions. Give your health care provider a list of all the medicines, herbs, non-prescription drugs, or dietary supplements you use. Also tell them if you smoke, drink alcohol, or use illegal drugs. Some items may interact with your medicine. What should I watch for while using this medicine? Visit your doctor or health care professional for regular checks on your progress. Your doctor or health care professional may order blood tests and other tests to see how you are doing. Call your doctor or health care professional if you get a cold or other infection while receiving this medicine. Do not treat yourself. This medicine may decrease your body's ability to fight infection. You should make sure you get enough calcium and vitamin D while you are taking this medicine, unless your doctor tells you not to. Discuss the foods you eat and the vitamins you take with your health care professional. See your dentist regularly. Brush and floss your teeth as directed. Before you have any dental work done, tell your dentist you are receiving this medicine. Do not become pregnant while taking this medicine or for 5 months after stopping   it. Women should inform their doctor if they wish to become pregnant or think they might be pregnant. There is a potential for serious side effects to an unborn child. Talk to your health care professional or pharmacist for more  information. What side effects may I notice from receiving this medicine? Side effects that you should report to your doctor or health care professional as soon as possible: -allergic reactions like skin rash, itching or hives, swelling of the face, lips, or tongue -breathing problems -chest pain -fast, irregular heartbeat -feeling faint or lightheaded, falls -fever, chills, or any other sign of infection -muscle spasms, tightening, or twitches -numbness or tingling -skin blisters or bumps, or is dry, peels, or red -slow healing or unexplained pain in the mouth or jaw -unusual bleeding or bruising Side effects that usually do not require medical attention (Report these to your doctor or health care professional if they continue or are bothersome.): -muscle pain -stomach upset, gas This list may not describe all possible side effects. Call your doctor for medical advice about side effects. You may report side effects to FDA at 1-800-FDA-1088. Where should I keep my medicine? This medicine is only given in a clinic, doctor's office, or other health care setting and will not be stored at home. NOTE: This sheet is a summary. It may not cover all possible information. If you have questions about this medicine, talk to your doctor, pharmacist, or health care provider.  2015, Elsevier/Gold Standard. (2011-11-10 12:37:47)  

## 2016-07-16 LAB — CANCER ANTIGEN 27.29: CA 27.29: 20.6 U/mL (ref 0.0–38.6)

## 2016-08-12 ENCOUNTER — Other Ambulatory Visit (HOSPITAL_BASED_OUTPATIENT_CLINIC_OR_DEPARTMENT_OTHER): Payer: Medicare Other

## 2016-08-12 ENCOUNTER — Ambulatory Visit (HOSPITAL_BASED_OUTPATIENT_CLINIC_OR_DEPARTMENT_OTHER): Payer: Medicare Other

## 2016-08-12 VITALS — BP 140/64 | HR 90 | Temp 97.9°F | Resp 20

## 2016-08-12 DIAGNOSIS — C7951 Secondary malignant neoplasm of bone: Secondary | ICD-10-CM

## 2016-08-12 DIAGNOSIS — C50412 Malignant neoplasm of upper-outer quadrant of left female breast: Secondary | ICD-10-CM | POA: Diagnosis not present

## 2016-08-12 DIAGNOSIS — M898X1 Other specified disorders of bone, shoulder: Secondary | ICD-10-CM | POA: Diagnosis not present

## 2016-08-12 DIAGNOSIS — C50919 Malignant neoplasm of unspecified site of unspecified female breast: Secondary | ICD-10-CM

## 2016-08-12 DIAGNOSIS — M810 Age-related osteoporosis without current pathological fracture: Secondary | ICD-10-CM

## 2016-08-12 DIAGNOSIS — C50912 Malignant neoplasm of unspecified site of left female breast: Secondary | ICD-10-CM

## 2016-08-12 LAB — CBC WITH DIFFERENTIAL/PLATELET
BASO%: 1.1 % (ref 0.0–2.0)
Basophils Absolute: 0.1 10*3/uL (ref 0.0–0.1)
EOS%: 2.9 % (ref 0.0–7.0)
Eosinophils Absolute: 0.1 10*3/uL (ref 0.0–0.5)
HCT: 44.1 % (ref 34.8–46.6)
HEMOGLOBIN: 14.9 g/dL (ref 11.6–15.9)
LYMPH%: 22.7 % (ref 14.0–49.7)
MCH: 31.2 pg (ref 25.1–34.0)
MCHC: 33.8 g/dL (ref 31.5–36.0)
MCV: 92.3 fL (ref 79.5–101.0)
MONO#: 0.5 10*3/uL (ref 0.1–0.9)
MONO%: 9.4 % (ref 0.0–14.0)
NEUT%: 63.9 % (ref 38.4–76.8)
NEUTROS ABS: 3.2 10*3/uL (ref 1.5–6.5)
Platelets: 259 10*3/uL (ref 145–400)
RBC: 4.78 10*6/uL (ref 3.70–5.45)
RDW: 14.4 % (ref 11.2–14.5)
WBC: 5 10*3/uL (ref 3.9–10.3)
lymph#: 1.1 10*3/uL (ref 0.9–3.3)

## 2016-08-12 LAB — COMPREHENSIVE METABOLIC PANEL
ALT: 17 U/L (ref 0–55)
AST: 21 U/L (ref 5–34)
Albumin: 3.8 g/dL (ref 3.5–5.0)
Alkaline Phosphatase: 44 U/L (ref 40–150)
Anion Gap: 8 mEq/L (ref 3–11)
BILIRUBIN TOTAL: 0.32 mg/dL (ref 0.20–1.20)
BUN: 16.1 mg/dL (ref 7.0–26.0)
CO2: 29 meq/L (ref 22–29)
CREATININE: 0.7 mg/dL (ref 0.6–1.1)
Calcium: 10.4 mg/dL (ref 8.4–10.4)
Chloride: 105 mEq/L (ref 98–109)
EGFR: 84 mL/min/{1.73_m2} — ABNORMAL LOW (ref >90)
GLUCOSE: 70 mg/dL (ref 70–140)
Potassium: 4.3 mEq/L (ref 3.5–5.1)
SODIUM: 142 meq/L (ref 136–145)
TOTAL PROTEIN: 7.2 g/dL (ref 6.4–8.3)

## 2016-08-12 MED ORDER — DENOSUMAB 120 MG/1.7ML ~~LOC~~ SOLN
120.0000 mg | Freq: Once | SUBCUTANEOUS | Status: AC
  Administered 2016-08-12: 120 mg via SUBCUTANEOUS
  Filled 2016-08-12: qty 1.7

## 2016-08-12 NOTE — Patient Instructions (Signed)
Denosumab injection What is this medicine? DENOSUMAB (den oh sue mab) slows bone breakdown. Prolia is used to treat osteoporosis in women after menopause and in men. Xgeva is used to prevent bone fractures and other bone problems caused by cancer bone metastases. Xgeva is also used to treat giant cell tumor of the bone. This medicine may be used for other purposes; ask your health care provider or pharmacist if you have questions. COMMON BRAND NAME(S): Prolia, XGEVA What should I tell my health care provider before I take this medicine? They need to know if you have any of these conditions: -dental disease -eczema -infection or history of infections -kidney disease or on dialysis -low blood calcium or vitamin D -malabsorption syndrome -scheduled to have surgery or tooth extraction -taking medicine that contains denosumab -thyroid or parathyroid disease -an unusual reaction to denosumab, other medicines, foods, dyes, or preservatives -pregnant or trying to get pregnant -breast-feeding How should I use this medicine? This medicine is for injection under the skin. It is given by a health care professional in a hospital or clinic setting. If you are getting Prolia, a special MedGuide will be given to you by the pharmacist with each prescription and refill. Be sure to read this information carefully each time. For Prolia, talk to your pediatrician regarding the use of this medicine in children. Special care may be needed. For Xgeva, talk to your pediatrician regarding the use of this medicine in children. While this drug may be prescribed for children as young as 13 years for selected conditions, precautions do apply. Overdosage: If you think you've taken too much of this medicine contact a poison control center or emergency room at once. Overdosage: If you think you have taken too much of this medicine contact a poison control center or emergency room at once. NOTE: This medicine is only for  you. Do not share this medicine with others. What if I miss a dose? It is important not to miss your dose. Call your doctor or health care professional if you are unable to keep an appointment. What may interact with this medicine? Do not take this medicine with any of the following medications: -other medicines containing denosumab This medicine may also interact with the following medications: -medicines that suppress the immune system -medicines that treat cancer -steroid medicines like prednisone or cortisone This list may not describe all possible interactions. Give your health care provider a list of all the medicines, herbs, non-prescription drugs, or dietary supplements you use. Also tell them if you smoke, drink alcohol, or use illegal drugs. Some items may interact with your medicine. What should I watch for while using this medicine? Visit your doctor or health care professional for regular checks on your progress. Your doctor or health care professional may order blood tests and other tests to see how you are doing. Call your doctor or health care professional if you get a cold or other infection while receiving this medicine. Do not treat yourself. This medicine may decrease your body's ability to fight infection. You should make sure you get enough calcium and vitamin D while you are taking this medicine, unless your doctor tells you not to. Discuss the foods you eat and the vitamins you take with your health care professional. See your dentist regularly. Brush and floss your teeth as directed. Before you have any dental work done, tell your dentist you are receiving this medicine. Do not become pregnant while taking this medicine or for 5 months after stopping   it. Women should inform their doctor if they wish to become pregnant or think they might be pregnant. There is a potential for serious side effects to an unborn child. Talk to your health care professional or pharmacist for more  information. What side effects may I notice from receiving this medicine? Side effects that you should report to your doctor or health care professional as soon as possible: -allergic reactions like skin rash, itching or hives, swelling of the face, lips, or tongue -breathing problems -chest pain -fast, irregular heartbeat -feeling faint or lightheaded, falls -fever, chills, or any other sign of infection -muscle spasms, tightening, or twitches -numbness or tingling -skin blisters or bumps, or is dry, peels, or red -slow healing or unexplained pain in the mouth or jaw -unusual bleeding or bruising Side effects that usually do not require medical attention (Report these to your doctor or health care professional if they continue or are bothersome.): -muscle pain -stomach upset, gas This list may not describe all possible side effects. Call your doctor for medical advice about side effects. You may report side effects to FDA at 1-800-FDA-1088. Where should I keep my medicine? This medicine is only given in a clinic, doctor's office, or other health care setting and will not be stored at home. NOTE: This sheet is a summary. It may not cover all possible information. If you have questions about this medicine, talk to your doctor, pharmacist, or health care provider.  2015, Elsevier/Gold Standard. (2011-11-10 12:37:47)  

## 2016-08-13 LAB — CANCER ANTIGEN 27.29: CAN 27.29: 26.7 U/mL (ref 0.0–38.6)

## 2016-09-09 ENCOUNTER — Other Ambulatory Visit (HOSPITAL_BASED_OUTPATIENT_CLINIC_OR_DEPARTMENT_OTHER): Payer: Medicare Other

## 2016-09-09 ENCOUNTER — Ambulatory Visit (HOSPITAL_BASED_OUTPATIENT_CLINIC_OR_DEPARTMENT_OTHER): Payer: Medicare Other

## 2016-09-09 VITALS — BP 145/78 | HR 74 | Temp 97.9°F | Resp 18

## 2016-09-09 DIAGNOSIS — C50412 Malignant neoplasm of upper-outer quadrant of left female breast: Secondary | ICD-10-CM

## 2016-09-09 DIAGNOSIS — C7951 Secondary malignant neoplasm of bone: Secondary | ICD-10-CM

## 2016-09-09 DIAGNOSIS — C50912 Malignant neoplasm of unspecified site of left female breast: Secondary | ICD-10-CM

## 2016-09-09 DIAGNOSIS — C50919 Malignant neoplasm of unspecified site of unspecified female breast: Secondary | ICD-10-CM

## 2016-09-09 DIAGNOSIS — M810 Age-related osteoporosis without current pathological fracture: Secondary | ICD-10-CM

## 2016-09-09 LAB — CBC WITH DIFFERENTIAL/PLATELET
BASO%: 1.1 % (ref 0.0–2.0)
Basophils Absolute: 0.1 10*3/uL (ref 0.0–0.1)
EOS ABS: 0.2 10*3/uL (ref 0.0–0.5)
EOS%: 4.2 % (ref 0.0–7.0)
HEMATOCRIT: 43 % (ref 34.8–46.6)
HGB: 14.6 g/dL (ref 11.6–15.9)
LYMPH#: 1.1 10*3/uL (ref 0.9–3.3)
LYMPH%: 21.4 % (ref 14.0–49.7)
MCH: 31.1 pg (ref 25.1–34.0)
MCHC: 34 g/dL (ref 31.5–36.0)
MCV: 91.4 fL (ref 79.5–101.0)
MONO#: 0.5 10*3/uL (ref 0.1–0.9)
MONO%: 9.2 % (ref 0.0–14.0)
NEUT#: 3.1 10*3/uL (ref 1.5–6.5)
NEUT%: 64.1 % (ref 38.4–76.8)
PLATELETS: 235 10*3/uL (ref 145–400)
RBC: 4.71 10*6/uL (ref 3.70–5.45)
RDW: 14 % (ref 11.2–14.5)
WBC: 4.9 10*3/uL (ref 3.9–10.3)

## 2016-09-09 LAB — COMPREHENSIVE METABOLIC PANEL
ALT: 15 U/L (ref 0–55)
ANION GAP: 10 meq/L (ref 3–11)
AST: 20 U/L (ref 5–34)
Albumin: 3.7 g/dL (ref 3.5–5.0)
Alkaline Phosphatase: 41 U/L (ref 40–150)
BILIRUBIN TOTAL: 0.38 mg/dL (ref 0.20–1.20)
BUN: 16.8 mg/dL (ref 7.0–26.0)
CALCIUM: 10.7 mg/dL — AB (ref 8.4–10.4)
CHLORIDE: 104 meq/L (ref 98–109)
CO2: 28 mEq/L (ref 22–29)
CREATININE: 0.7 mg/dL (ref 0.6–1.1)
EGFR: 83 mL/min/{1.73_m2} — ABNORMAL LOW (ref >90)
Glucose: 80 mg/dl (ref 70–140)
Potassium: 4.4 mEq/L (ref 3.5–5.1)
Sodium: 143 mEq/L (ref 136–145)
TOTAL PROTEIN: 7 g/dL (ref 6.4–8.3)

## 2016-09-09 MED ORDER — DENOSUMAB 120 MG/1.7ML ~~LOC~~ SOLN
120.0000 mg | Freq: Once | SUBCUTANEOUS | Status: AC
  Administered 2016-09-09: 120 mg via SUBCUTANEOUS
  Filled 2016-09-09: qty 1.7

## 2016-09-09 NOTE — Patient Instructions (Signed)
Denosumab injection What is this medicine? DENOSUMAB (den oh sue mab) slows bone breakdown. Prolia is used to treat osteoporosis in women after menopause and in men. Xgeva is used to prevent bone fractures and other bone problems caused by cancer bone metastases. Xgeva is also used to treat giant cell tumor of the bone. This medicine may be used for other purposes; ask your health care provider or pharmacist if you have questions. COMMON BRAND NAME(S): Prolia, XGEVA What should I tell my health care provider before I take this medicine? They need to know if you have any of these conditions: -dental disease -eczema -infection or history of infections -kidney disease or on dialysis -low blood calcium or vitamin D -malabsorption syndrome -scheduled to have surgery or tooth extraction -taking medicine that contains denosumab -thyroid or parathyroid disease -an unusual reaction to denosumab, other medicines, foods, dyes, or preservatives -pregnant or trying to get pregnant -breast-feeding How should I use this medicine? This medicine is for injection under the skin. It is given by a health care professional in a hospital or clinic setting. If you are getting Prolia, a special MedGuide will be given to you by the pharmacist with each prescription and refill. Be sure to read this information carefully each time. For Prolia, talk to your pediatrician regarding the use of this medicine in children. Special care may be needed. For Xgeva, talk to your pediatrician regarding the use of this medicine in children. While this drug may be prescribed for children as young as 13 years for selected conditions, precautions do apply. Overdosage: If you think you've taken too much of this medicine contact a poison control center or emergency room at once. Overdosage: If you think you have taken too much of this medicine contact a poison control center or emergency room at once. NOTE: This medicine is only for  you. Do not share this medicine with others. What if I miss a dose? It is important not to miss your dose. Call your doctor or health care professional if you are unable to keep an appointment. What may interact with this medicine? Do not take this medicine with any of the following medications: -other medicines containing denosumab This medicine may also interact with the following medications: -medicines that suppress the immune system -medicines that treat cancer -steroid medicines like prednisone or cortisone This list may not describe all possible interactions. Give your health care provider a list of all the medicines, herbs, non-prescription drugs, or dietary supplements you use. Also tell them if you smoke, drink alcohol, or use illegal drugs. Some items may interact with your medicine. What should I watch for while using this medicine? Visit your doctor or health care professional for regular checks on your progress. Your doctor or health care professional may order blood tests and other tests to see how you are doing. Call your doctor or health care professional if you get a cold or other infection while receiving this medicine. Do not treat yourself. This medicine may decrease your body's ability to fight infection. You should make sure you get enough calcium and vitamin D while you are taking this medicine, unless your doctor tells you not to. Discuss the foods you eat and the vitamins you take with your health care professional. See your dentist regularly. Brush and floss your teeth as directed. Before you have any dental work done, tell your dentist you are receiving this medicine. Do not become pregnant while taking this medicine or for 5 months after stopping   it. Women should inform their doctor if they wish to become pregnant or think they might be pregnant. There is a potential for serious side effects to an unborn child. Talk to your health care professional or pharmacist for more  information. What side effects may I notice from receiving this medicine? Side effects that you should report to your doctor or health care professional as soon as possible: -allergic reactions like skin rash, itching or hives, swelling of the face, lips, or tongue -breathing problems -chest pain -fast, irregular heartbeat -feeling faint or lightheaded, falls -fever, chills, or any other sign of infection -muscle spasms, tightening, or twitches -numbness or tingling -skin blisters or bumps, or is dry, peels, or red -slow healing or unexplained pain in the mouth or jaw -unusual bleeding or bruising Side effects that usually do not require medical attention (Report these to your doctor or health care professional if they continue or are bothersome.): -muscle pain -stomach upset, gas This list may not describe all possible side effects. Call your doctor for medical advice about side effects. You may report side effects to FDA at 1-800-FDA-1088. Where should I keep my medicine? This medicine is only given in a clinic, doctor's office, or other health care setting and will not be stored at home. NOTE: This sheet is a summary. It may not cover all possible information. If you have questions about this medicine, talk to your doctor, pharmacist, or health care provider.  2015, Elsevier/Gold Standard. (2011-11-10 12:37:47)  

## 2016-09-10 LAB — CANCER ANTIGEN 27.29: CAN 27.29: 21.9 U/mL (ref 0.0–38.6)

## 2016-09-12 DIAGNOSIS — M810 Age-related osteoporosis without current pathological fracture: Secondary | ICD-10-CM | POA: Diagnosis not present

## 2016-09-22 ENCOUNTER — Other Ambulatory Visit: Payer: Self-pay | Admitting: Oncology

## 2016-09-22 DIAGNOSIS — Z853 Personal history of malignant neoplasm of breast: Secondary | ICD-10-CM

## 2016-10-01 ENCOUNTER — Encounter (HOSPITAL_COMMUNITY)
Admission: RE | Admit: 2016-10-01 | Discharge: 2016-10-01 | Disposition: A | Discharge status: Home / Self Care | Payer: Medicare Other | Source: Ambulatory Visit | Attending: Oncology | Admitting: Oncology

## 2016-10-01 DIAGNOSIS — C7951 Secondary malignant neoplasm of bone: Secondary | ICD-10-CM | POA: Diagnosis not present

## 2016-10-01 DIAGNOSIS — Z170 Estrogen receptor positive status [ER+]: Secondary | ICD-10-CM | POA: Insufficient documentation

## 2016-10-01 DIAGNOSIS — C50919 Malignant neoplasm of unspecified site of unspecified female breast: Secondary | ICD-10-CM | POA: Diagnosis not present

## 2016-10-01 DIAGNOSIS — C50912 Malignant neoplasm of unspecified site of left female breast: Secondary | ICD-10-CM | POA: Insufficient documentation

## 2016-10-01 DIAGNOSIS — C50412 Malignant neoplasm of upper-outer quadrant of left female breast: Secondary | ICD-10-CM | POA: Diagnosis not present

## 2016-10-01 MED ORDER — TECHNETIUM TC 99M MEDRONATE IV KIT: 25.0000 | PACK | Freq: Once | INTRAVENOUS | Status: DC | PRN

## 2016-10-07 ENCOUNTER — Other Ambulatory Visit: Payer: Medicare Other

## 2016-10-07 ENCOUNTER — Ambulatory Visit (HOSPITAL_BASED_OUTPATIENT_CLINIC_OR_DEPARTMENT_OTHER): Payer: Medicare Other | Admitting: Oncology

## 2016-10-07 ENCOUNTER — Ambulatory Visit (HOSPITAL_BASED_OUTPATIENT_CLINIC_OR_DEPARTMENT_OTHER): Payer: Medicare Other

## 2016-10-07 VITALS — BP 120/68 | HR 93 | Temp 97.5°F | Resp 20 | Ht 61.0 in | Wt 122.1 lb

## 2016-10-07 DIAGNOSIS — C7951 Secondary malignant neoplasm of bone: Secondary | ICD-10-CM | POA: Diagnosis not present

## 2016-10-07 DIAGNOSIS — C50412 Malignant neoplasm of upper-outer quadrant of left female breast: Secondary | ICD-10-CM

## 2016-10-07 DIAGNOSIS — C50919 Malignant neoplasm of unspecified site of unspecified female breast: Secondary | ICD-10-CM

## 2016-10-07 DIAGNOSIS — C50912 Malignant neoplasm of unspecified site of left female breast: Secondary | ICD-10-CM

## 2016-10-07 DIAGNOSIS — Z170 Estrogen receptor positive status [ER+]: Principal | ICD-10-CM

## 2016-10-07 DIAGNOSIS — M810 Age-related osteoporosis without current pathological fracture: Secondary | ICD-10-CM

## 2016-10-07 DIAGNOSIS — E041 Nontoxic single thyroid nodule: Secondary | ICD-10-CM

## 2016-10-07 LAB — CBC WITH DIFFERENTIAL/PLATELET
BASO%: 0.6 % (ref 0.0–2.0)
Basophils Absolute: 0 10*3/uL (ref 0.0–0.1)
EOS ABS: 0.3 10*3/uL (ref 0.0–0.5)
EOS%: 5.1 % (ref 0.0–7.0)
HCT: 42.2 % (ref 34.8–46.6)
HEMOGLOBIN: 14.3 g/dL (ref 11.6–15.9)
LYMPH%: 22.4 % (ref 14.0–49.7)
MCH: 31 pg (ref 25.1–34.0)
MCHC: 33.9 g/dL (ref 31.5–36.0)
MCV: 91.5 fL (ref 79.5–101.0)
MONO#: 0.4 10*3/uL (ref 0.1–0.9)
MONO%: 7.5 % (ref 0.0–14.0)
NEUT%: 64.4 % (ref 38.4–76.8)
NEUTROS ABS: 3.2 10*3/uL (ref 1.5–6.5)
Platelets: 214 10*3/uL (ref 145–400)
RBC: 4.61 10*6/uL (ref 3.70–5.45)
RDW: 14 % (ref 11.2–14.5)
WBC: 4.9 10*3/uL (ref 3.9–10.3)
lymph#: 1.1 10*3/uL (ref 0.9–3.3)

## 2016-10-07 LAB — COMPREHENSIVE METABOLIC PANEL
ALBUMIN: 3.8 g/dL (ref 3.5–5.0)
ALK PHOS: 41 U/L (ref 40–150)
ALT: 16 U/L (ref 0–55)
ANION GAP: 9 meq/L (ref 3–11)
AST: 20 U/L (ref 5–34)
BILIRUBIN TOTAL: 0.38 mg/dL (ref 0.20–1.20)
BUN: 16.8 mg/dL (ref 7.0–26.0)
CALCIUM: 10.5 mg/dL — AB (ref 8.4–10.4)
CO2: 27 mEq/L (ref 22–29)
Chloride: 106 mEq/L (ref 98–109)
Creatinine: 0.7 mg/dL (ref 0.6–1.1)
EGFR: 83 mL/min/{1.73_m2} — AB (ref >90)
Glucose: 98 mg/dl (ref 70–140)
Potassium: 4.5 mEq/L (ref 3.5–5.1)
Sodium: 142 mEq/L (ref 136–145)
Total Protein: 7.1 g/dL (ref 6.4–8.3)

## 2016-10-07 MED ORDER — DENOSUMAB 120 MG/1.7ML ~~LOC~~ SOLN
120.0000 mg | Freq: Once | SUBCUTANEOUS | Status: AC
  Administered 2016-10-07: 120 mg via SUBCUTANEOUS
  Filled 2016-10-07: qty 1.7

## 2016-10-07 NOTE — Patient Instructions (Signed)
Denosumab injection What is this medicine? DENOSUMAB (den oh sue mab) slows bone breakdown. Prolia is used to treat osteoporosis in women after menopause and in men. Xgeva is used to prevent bone fractures and other bone problems caused by cancer bone metastases. Xgeva is also used to treat giant cell tumor of the bone. This medicine may be used for other purposes; ask your health care provider or pharmacist if you have questions. COMMON BRAND NAME(S): Prolia, XGEVA What should I tell my health care provider before I take this medicine? They need to know if you have any of these conditions: -dental disease -eczema -infection or history of infections -kidney disease or on dialysis -low blood calcium or vitamin D -malabsorption syndrome -scheduled to have surgery or tooth extraction -taking medicine that contains denosumab -thyroid or parathyroid disease -an unusual reaction to denosumab, other medicines, foods, dyes, or preservatives -pregnant or trying to get pregnant -breast-feeding How should I use this medicine? This medicine is for injection under the skin. It is given by a health care professional in a hospital or clinic setting. If you are getting Prolia, a special MedGuide will be given to you by the pharmacist with each prescription and refill. Be sure to read this information carefully each time. For Prolia, talk to your pediatrician regarding the use of this medicine in children. Special care may be needed. For Xgeva, talk to your pediatrician regarding the use of this medicine in children. While this drug may be prescribed for children as young as 13 years for selected conditions, precautions do apply. Overdosage: If you think you've taken too much of this medicine contact a poison control center or emergency room at once. Overdosage: If you think you have taken too much of this medicine contact a poison control center or emergency room at once. NOTE: This medicine is only for  you. Do not share this medicine with others. What if I miss a dose? It is important not to miss your dose. Call your doctor or health care professional if you are unable to keep an appointment. What may interact with this medicine? Do not take this medicine with any of the following medications: -other medicines containing denosumab This medicine may also interact with the following medications: -medicines that suppress the immune system -medicines that treat cancer -steroid medicines like prednisone or cortisone This list may not describe all possible interactions. Give your health care provider a list of all the medicines, herbs, non-prescription drugs, or dietary supplements you use. Also tell them if you smoke, drink alcohol, or use illegal drugs. Some items may interact with your medicine. What should I watch for while using this medicine? Visit your doctor or health care professional for regular checks on your progress. Your doctor or health care professional may order blood tests and other tests to see how you are doing. Call your doctor or health care professional if you get a cold or other infection while receiving this medicine. Do not treat yourself. This medicine may decrease your body's ability to fight infection. You should make sure you get enough calcium and vitamin D while you are taking this medicine, unless your doctor tells you not to. Discuss the foods you eat and the vitamins you take with your health care professional. See your dentist regularly. Brush and floss your teeth as directed. Before you have any dental work done, tell your dentist you are receiving this medicine. Do not become pregnant while taking this medicine or for 5 months after stopping   it. Women should inform their doctor if they wish to become pregnant or think they might be pregnant. There is a potential for serious side effects to an unborn child. Talk to your health care professional or pharmacist for more  information. What side effects may I notice from receiving this medicine? Side effects that you should report to your doctor or health care professional as soon as possible: -allergic reactions like skin rash, itching or hives, swelling of the face, lips, or tongue -breathing problems -chest pain -fast, irregular heartbeat -feeling faint or lightheaded, falls -fever, chills, or any other sign of infection -muscle spasms, tightening, or twitches -numbness or tingling -skin blisters or bumps, or is dry, peels, or red -slow healing or unexplained pain in the mouth or jaw -unusual bleeding or bruising Side effects that usually do not require medical attention (Report these to your doctor or health care professional if they continue or are bothersome.): -muscle pain -stomach upset, gas This list may not describe all possible side effects. Call your doctor for medical advice about side effects. You may report side effects to FDA at 1-800-FDA-1088. Where should I keep my medicine? This medicine is only given in a clinic, doctor's office, or other health care setting and will not be stored at home. NOTE: This sheet is a summary. It may not cover all possible information. If you have questions about this medicine, talk to your doctor, pharmacist, or health care provider.  2015, Elsevier/Gold Standard. (2011-11-10 12:37:47)  

## 2016-10-07 NOTE — Progress Notes (Signed)
[place]  Telephone:(336) [phone] Fax:(336) [phone]   ID: April Rosales DOB: 1940/10/17  MR#: [id number]  IRW#:[id number]  Patient Care Team: [name], MD as PCP - General (Internal Medicine) Myisha Pickerel, [name], MD as Consulting Physician (Oncology) PCP: [name], MD GYN: SU: [name] M.D. OTHER MD:  Rodell Perna M.D., [name] MD, Stephannie Peters M.D.  CHIEF COMPLAINT: Stage IV breast cancer  CURRENT TREATMENT:  Letrozole; denosumab/Xgeva  BREAST CANCER HISTORY: From the original intake note:   April Rosales underwent right lumpectomy in 1985 at [facility] for what sounds like a stage I breast cancer. She tells me she had more than 30 lymph nodes removed from her right axilla and all of them were clear. She received  adjuvant radiation but no systemic treatment.  The patient had recently refused mammography with the last mammogram I can find dating back to August 2010.  More recently the patient presented with left scapular pain radiating down the left arm.  She was evaluated by Dr. [name], who obtained a chest x-ray showing a possible abnormality at T3. He then set up the patient for an MRI of the thoracic spine performed 08/16/2014.  This showed multiple compression fractures  (a similar picture had been noted on lumbar MRI 10/09/2011 ). However at T3 they noted tumor in the vertebral body extending into the left pedicle and into the lateral epidural space, displacing the cord to the right. There was no evidence of cord compression or cord signal abnormality. There were no other areas of tumor identified in the thoracic spine.         The patient was then referred to Dr. Hal Neer who on 08/24/2014 set April Rosales up for CT scans of the chest, abdomen and pelvis. There was a dense mass in the upper inner quadrant of the left breast measuring 1.6 cm. There was a 1.2 cm nodule in the minor fissure of the right lung and some evidence of right  lung fibrosis at the site of the prior radiation port.  There was also a thyroid mass measuring 2 cm. However there were no parenchymal lung or liver lesions. Incidental meningoceles were noted as well as sclerosis of the fifth and sixth ribs which were felt to be likely posttraumatic.   On 08/29/2014 the patient underwent bilateral diagnostic mammography with tomosynthesis and left breast ultrasonography.  There were postsurgical changes in the upper right breast. In the left breast there was an irregular mass measuring 2.3 cm in the upper inner quadrant. This was palpable. Ultrasound showed this to be hypoechoic and to measure 2.0 cm. There were adjacent areas of nodularity. There was no definite lymphadenopathy in the left axilla.  Biopsy of this breast mass 08/29/2014 showed an invasive adenocarcinoma with both ductal and lobular features (there was strong diffuse E-cadherin expression as well as areas with total absence of E-cadherin expression), with the preliminary prognostic profile showing strong estrogen positivity, very weak to near absent progesterone positivity, an MIB-1 of approximately 40%, and HER-2 equivocal  The patient's subsequent history is as detailed below.  INTERVAL HISTORY: April Rosales returns today for follow-up of her estrogen receptor positive breast cancer. She continues on letrozole, generally with good tolerance. Hot flashes and vaginal dryness are not a problem. She obtains a drug at practically no cost  She also receives denosumab/Xgeva monthly. She has no problems with those injections. She has not had difficulties with hypocalcemia oh significant bony pains.  She was just restaged with a bone  scan which is very favorable. She had a bone density scan which showed severe osteoporosis.  REVIEW OF SYSTEMS: April Rosales did get to meet with Dr. Maryjean Ka but she tells me he felt her pain really was not "in his bilywick". She is taking Neurontin and 100 mg at bedtime. Otherwise she  takes rare Tylenol for pain which is mostly in the mid back. Aside from these issues a detailed review of systems today was stable.  PAST MEDICAL HISTORY: Past Medical History:  Diagnosis Date  . Arthritis   . Breast cancer [facility]) 1985/2016   takes [name] daily  . Chronic back pain    stenosis/listhesis  . Family history of ovarian cancer   . Osteoporosis    takes Vit D  . Radiation 11/23/14-12/11/14   30 Gy T1-T5     PAST SURGICAL HISTORY: Past Surgical History:  Procedure Laterality Date  . ABDOMINAL HYSTERECTOMY     1980  . APPENDECTOMY  1977  . APPLICATION OF INTRAOPERATIVE CT SCAN N/A 10/20/2014   Procedure: APPLICATION OF INTRAOPERATIVE CAT SCAN;  Surgeon: [name], MD;  Location: [place] [facility];  Service: Neurosurgery;  Laterality: N/A;  . BREAST SURGERY Right 1985  . THYROID CYST EXCISION  1967    FAMILY HISTORY Family History  Problem Relation Age of Onset  . Heart disease Mother   . Hypertension Mother   . Heart disease Father   . Diabetes Father   . Breast cancer Paternal Aunt        4 paternal aunts with breast cancer over 80  . Prostate cancer Paternal Uncle   . Stroke Paternal Grandfather   . Ovarian cancer Paternal Aunt   . Huntington's disease Other        Nephew, inherited from his father   The patient's father died from a heart attack at the age of 70. He had 9 sisters. 3 of those sisters had breast cancer, all in a menopausal setting. Another sister had ovarian cancer. One of the paternal uncles had cancer of the colon "and back".  The patient's mother died at the age of 77. She was found to have breast cancer shortly before dying , during her final hospitalization.  GYNECOLOGIC HISTORY:  No LMP recorded. Patient has had a hysterectomy.  Menarche age 82, first live birth age 72, the patient is GX P1. She underwent hysterectomy in 1980. She thinks the ovaries were removed, but the CT scan obtained 08/24/2014 showed a definite right ovary. The left  ovary may have been removed. She did not take hormone replacement after the hysterectomy.  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 She is divorced, lives alone, with 2 cats. Her son April Rosales lives in [place] where he works in Engineer, technical sales. He has 4 children of his own. The patient is a Psychologist, forensic.    ADVANCED DIRECTIVES:  In place. The patient has named her sister April Rosales [phone]) and her friend April Rosales [phone]) as joint healthcare powers of attorney   HEALTH MAINTENANCE: Social History  Substance Use Topics  . Smoking status: Former Smoker    Packs/day: 0.50    Years: 10.00    Types: Cigarettes  . Smokeless tobacco: Former Systems developer     Comment: quit smoking 1970's  . Alcohol use 0.0 oz/week     Comment: Rare     Colonoscopy: never  PAP: status post hysterectomy  Bone density: 08/14/2014 at [facility], results pending  Lipid panel:  Allergies  Allergen Reactions  . Ativan [Lorazepam]  Made her crazy  . Dilaudid [Hydromorphone Hcl]     Doesn't want  . Haldol [Haloperidol Lactate]     Made her crazy    Current Outpatient Prescriptions  Medication Sig Dispense Refill  . amoxicillin-clavulanate (AUGMENTIN) 875-125 MG tablet Take 1 tablet by mouth 2 (two) times daily. 7 days 14 tablet 0  . Ascorbic Acid (VITAMIN C PO) Take 1 tablet by mouth every other day. Takes qod    . aspirin EC 325 MG tablet Take 1 tablet (325 mg total) by mouth daily. 30 tablet 0  . Cholecalciferol (VITAMIN D3 ADULT GUMMIES PO) Take 4,000 Units by mouth daily.    . Cyanocobalamin (VITAMIN B-12 PO) Take by mouth daily. Takes every other day    . gabapentin (NEURONTIN) 300 MG capsule Take 1 capsule (300 mg total) by mouth at bedtime. 30 capsule 1  . hydrochlorothiazide (MICROZIDE) 12.5 MG capsule Take 1 capsule (12.5 mg total) by mouth daily. 30 capsule 0  . letrozole (FEMARA) 2.5 MG tablet TAKE 1 TABLET(2.5 MG) BY MOUTH DAILY 90 tablet 0   No current  facility-administered medications for this visit.     OBJECTIVE:  Older white woman  In no acute distress Vitals:   10/07/16 1251  BP: 120/68  Pulse: 93  Resp: 20  Temp: 97.5 F (36.4 C)     Body mass index is 23.07 kg/m.    ECOG FS:1 - Symptomatic but completely ambulatory  Sclerae unicteric, pupils round and equal Oropharynx clear and moist No cervical or supraclavicular adenopathy Lungs no rales or rhonchi Heart regular rate and rhythm Abd soft, nontender, positive bowel sounds MSK kyphosis but no focal spinal tenderness, no upper extremity lymphedema Neuro: nonfocal, well oriented, appropriate affect Breasts: The right breast is status post lumpectomy. There is no evidence of local recurrence. The left breast is benign. Both axillae are benign.  LAB RESULTS:  CMP     Component Value Date/Time   NA 143 09/09/2016 1102   K 4.4 09/09/2016 1102   CL 105 07/07/2016 1125   CO2 28 09/09/2016 1102   GLUCOSE 80 09/09/2016 1102   BUN 16.8 09/09/2016 1102   CREATININE 0.7 09/09/2016 1102   CALCIUM 10.7 (H) 09/09/2016 1102   PROT 7.0 09/09/2016 1102   ALBUMIN 3.7 09/09/2016 1102   AST 20 09/09/2016 1102   ALT 15 09/09/2016 1102   ALKPHOS 41 09/09/2016 1102   BILITOT 0.38 09/09/2016 1102   GFRNONAA 85 07/07/2016 1125   GFRAA >89 07/07/2016 1125    INo results found for: SPEP, UPEP  Lab Results  Component Value Date   WBC 4.9 10/07/2016   NEUTROABS 3.2 10/07/2016   HGB 14.3 10/07/2016   HCT 42.2 10/07/2016   MCV 91.5 10/07/2016   PLT 214 10/07/2016      Chemistry      Component Value Date/Time   NA 143 09/09/2016 1102   K 4.4 09/09/2016 1102   CL 105 07/07/2016 1125   CO2 28 09/09/2016 1102   BUN 16.8 09/09/2016 1102   CREATININE 0.7 09/09/2016 1102      Component Value Date/Time   CALCIUM 10.7 (H) 09/09/2016 1102   ALKPHOS 41 09/09/2016 1102   AST 20 09/09/2016 1102   ALT 15 09/09/2016 1102   BILITOT 0.38 09/09/2016 1102       Lab Results    Component Value Date   LABCA2 24 01/01/2016    No components found for: [id number]  No results for input(s): INR in the  last 168 hours.  Urinalysis    Component Value Date/Time   COLORURINE YELLOW 09/19/2015 1239   APPEARANCEUR CLOUDY (A) 09/19/2015 1239   LABSPEC 1.015 09/19/2015 1239   PHURINE 7.5 09/19/2015 1239   GLUCOSEU NEGATIVE 09/19/2015 1239   HGBUR NEGATIVE 09/19/2015 1239   BILIRUBINUR NEGATIVE 09/19/2015 1239   KETONESUR NEGATIVE 09/19/2015 1239   PROTEINUR NEGATIVE 09/19/2015 1239   NITRITE NEGATIVE 09/19/2015 1239   LEUKOCYTESUR NEGATIVE 09/19/2015 1239    STUDIES:Nm Bone Scan Whole Body  Result Date: 10/01/2016 CLINICAL DATA:  Breast cancer with history of bone mets to the thoracic spine. Chronic bilateral hip pain. EXAM: NUCLEAR MEDICINE WHOLE BODY BONE SCAN TECHNIQUE: Whole body anterior and posterior images were obtained approximately 3 hours after intravenous injection of radiopharmaceutical. RADIOPHARMACEUTICALS:  22.0 mCi Technetium-74mMDP IV COMPARISON:  Thoracic spine MRI 04/02/2016 FINDINGS: There is increased activity noted in the upper thoracic spine, likely postoperative. Scoliosis in the lower thoracic and lumbar spine. No other areas of suspicious bony uptake. Soft tissue activity is unremarkable. IMPRESSION: Uptake in the upper thoracic spine is likely postoperative. Recurrent/residual metastatic disease cannot be completely excluded. This could be further evaluated with MRI. Electronically Signed   By: KRolm BaptiseM.D.   On: 10/01/2016 15:47      ASSESSMENT: 76y.o. [place] woman with stage IV left breast cancer involving bone  (1) status post right lumpectomy and axillary node dissection in [place] [facility] measurable disease in spine, lung and left breast   (2) evaluation for left shoulder pain led to thoracic spine MRI 08/16/2014 showing a pathologic fracture at T3 with epidural tumor displacing  the cord to the right, but no cord compression. CT scans of the chest, abdomen and pelvis 08/24/2014 showed in addition a mass in the upper outer quadrant left breast measuring 1.6 cm and a nodule in the minor fissure of the right lung measuring 1.2 cm, but no parenchymal lung or liver lesions. CA 27-29 was noninformative at 38 (09/20/2014)    (3) mammography and ultrasonography 08/29/2014 show a mass in the upper inner left breast which was palpable,  measuring 2.0 cm by ultrasound. Biopsy of this mass 08/29/2014 showed an invasive breast cancer with both lobular and ductal features, estrogen receptor positive, progesterone receptor weakly positive, with an MIB-1 in the 40% range, HER-2 equivocal (6 else ratio 1.5, but average number her nucleus 5.8)   (4) letrozole started 08/31/2014;   (a) palbociclib added Sept 2016 at 75 mg 21/7, with significant neutropenia; not repeated after first cycle   (5)  zolendronate started 09/20/2014, stopped after initial dose due to poor tolerance  (a) denosumab/ Xgeva started 01/31/2015, repeated every 4 weeks. Moved to every 8 weeks starting 07/17/15.    (6) on 10/20/2014 the patient underwent T2-T3 and T4 decompressive laminectomy with removal of epidural tumor, C7-T4 segmental pedicle screw instrumentation with virage screw system with arrow guidance protocol and C7-T4 posterolateral fusion. The cells were positive for the estrogen receptor. [facility],  (7) radiation 11/23/2014-12/11/2014.  (a) T1-T5 was treated to 30 Gy in 12 fractions at 2.5 Gy per fraction   (8)  consider eventual left lumpectomy or mastectomy depending on  longer-term results of systemic therapy  (9) genetics testing using the Breast/Ovarian Cancer Panel through [facility] ([name] MD) found no mutations in ATM, BARD1, BRCA1, BRCA2, BRIP1, CDH1, CHEK2, EPCAM, FANCC, MLH1, MSH2, MSH6, NBN, PALB2, PMS2, PTEN, RAD51C, RAD51D, STK11,  TP53, or XRCC2     (10) right thyroid nodule is a complex cyst as noted on CT scan of the neck 01/29/2016  (11) osteoporosis: Bone density at [facility] 09/12/2016 showed a T score of -4.8.  (a) continue Xgeva as above    PLAN: I spent approximately 30 minutes with April Rosales with most of that time spent discussing her complex problems.  She appears to continue to have a very good response to her minimal treatment, which consists of and also mild and anastrozole. Furthermore she is tolerating these drugs with no side effects that she is aware of. The plan is to continue, and reassess in 3-6 months.  I reviewed her bone scan in detail with her. We also discussed her use of gabapentin which is variable.  Finally we discussed her significant osteoporosis. She is not walking and that is one thing that would help. She is already of course receiving the Xgeva and vitamin D supplementation  Incidentally her sister will be studying April Rosales will return to see me in approximately 3 months. We will continue to follow her lab work closely with her monthly denosumab treatments. We will repeat a bone scan again in 2 years.  She knows to call for any problems that may develop before her next visit.      Chauncey Cruel, MD   10/07/2016 1:06 PM .

## 2016-10-16 ENCOUNTER — Encounter: Payer: Self-pay | Admitting: Internal Medicine

## 2016-10-30 ENCOUNTER — Ambulatory Visit
Admission: RE | Admit: 2016-10-30 | Discharge: 2016-10-30 | Disposition: A | Discharge status: Home / Self Care | Payer: Medicare Other | Source: Ambulatory Visit | Attending: Oncology | Admitting: Oncology

## 2016-10-30 DIAGNOSIS — C50412 Malignant neoplasm of upper-outer quadrant of left female breast: Secondary | ICD-10-CM

## 2016-10-30 DIAGNOSIS — C7951 Secondary malignant neoplasm of bone: Secondary | ICD-10-CM

## 2016-10-30 DIAGNOSIS — Z170 Estrogen receptor positive status [ER+]: Principal | ICD-10-CM

## 2016-10-30 DIAGNOSIS — C50912 Malignant neoplasm of unspecified site of left female breast: Secondary | ICD-10-CM

## 2016-10-30 DIAGNOSIS — Z853 Personal history of malignant neoplasm of breast: Secondary | ICD-10-CM

## 2016-10-31 ENCOUNTER — Ambulatory Visit (INDEPENDENT_AMBULATORY_CARE_PROVIDER_SITE_OTHER): Payer: Medicare Other | Admitting: Internal Medicine

## 2016-10-31 ENCOUNTER — Encounter: Payer: Self-pay | Admitting: Oncology

## 2016-10-31 VITALS — BP 126/80 | HR 72 | Temp 98.2°F | Resp 16 | Ht 61.5 in | Wt 121.4 lb

## 2016-10-31 DIAGNOSIS — R7303 Prediabetes: Secondary | ICD-10-CM | POA: Diagnosis not present

## 2016-10-31 DIAGNOSIS — R7309 Other abnormal glucose: Secondary | ICD-10-CM

## 2016-10-31 DIAGNOSIS — Z79899 Other long term (current) drug therapy: Secondary | ICD-10-CM

## 2016-10-31 DIAGNOSIS — R0989 Other specified symptoms and signs involving the circulatory and respiratory systems: Secondary | ICD-10-CM | POA: Diagnosis not present

## 2016-10-31 DIAGNOSIS — C50919 Malignant neoplasm of unspecified site of unspecified female breast: Secondary | ICD-10-CM

## 2016-10-31 DIAGNOSIS — E782 Mixed hyperlipidemia: Secondary | ICD-10-CM | POA: Diagnosis not present

## 2016-10-31 DIAGNOSIS — Z136 Encounter for screening for cardiovascular disorders: Secondary | ICD-10-CM

## 2016-10-31 DIAGNOSIS — Z1212 Encounter for screening for malignant neoplasm of rectum: Secondary | ICD-10-CM

## 2016-10-31 DIAGNOSIS — E559 Vitamin D deficiency, unspecified: Secondary | ICD-10-CM

## 2016-10-31 NOTE — Progress Notes (Signed)
[facility] [name], M.D.      Uvaldo Bristle. Silverio Lay, P.A.-C [facility]                [address] [place], [place] [address] Telephone [phone] Telefax [phone]  Comprehensive Evaluation &  Examination     This very nice 76 y.o. DWF presents for a  comprehensive evaluation and management of multiple medical co-morbidities.  Patient has been followed for HTN, T2_NIDDM  Prediabetes, Hyperlipidemia and Vitamin D Deficiency.    Patient has remote hx/o Rt Breast cancer (1984) and then a 2sd  Lt Breast cancer metastatic to T3 vertebrae - s/p T3 decompression Mar 2016 followed by Chemoradiation and now f on maintenance Letrozole followed closely by Dr Jana Hakim       Patient is followed expectantly with hx/o labile HTN. Patient's BP has been controlled at home and patient denies any cardiac symptoms as chest pain, palpitations, shortness of breath, dizziness or ankle swelling. Today's BP is at goal - 126/80      Patient's hyperlipidemia is controlled with diet and medications. Patient denies myalgias or other medication SE's. Last lipids were not at goal: Lab Results  Component Value Date   CHOL 197 09/19/2015   HDL 56 09/19/2015   LDLCALC 119 09/19/2015   TRIG 109 09/19/2015   CHOLHDL 3.5 09/19/2015      Patient has hx/o abnormal glucose and has been monitored expectantly for prediabetes  and patient denies reactive hypoglycemic symptoms, visual blurring, diabetic polys, or paresthesias. Last A1c was at goal: Lab Results  Component Value Date   HGBA1C 5.2 09/19/2015      Finally, patient has history of Vitamin D Deficiency and last Vitamin D was at goal: Lab Results  Component Value Date   VD25OH 66 09/19/2015   Current Outpatient Prescriptions on File Prior to Visit  Medication Sig  . VITAMIN C Take 1 tablet by mouth every other day  . aspirin EC 325 MG tablet Take 1 tab daily.  Marland Kitchen VIT  D3 ADULT GUMMIES  Take 4,000 Units  daily.  Marland Kitchen VITAMIN B-12  Takes every other day  . gabapentin  300 MG capsule Take 1 cap at bedtime.  Marland Kitchen letrozole  2.5 MG tablet TAKE 1 TAB DAILY   Allergies  Allergen Reactions  . Ativan [Lorazepam]     Made her crazy  . Dilaudid [Hydromorphone Hcl]     Doesn't want  . Haldol [Haloperidol Lactate]     Made her crazy   Past Medical History:  Diagnosis Date  . Arthritis   . Breast cancer [facility]) 1985/2016   takes [name] daily  . Chronic back pain    stenosis/listhesis  . Family history of ovarian cancer   . Osteoporosis    takes Vit D  . Radiation 11/23/14-12/11/14   30 Gy T1-T5    Health Maintenance  Topic Date Due  . PNA vac Low Risk Adult (1 of 2 - PCV13) 05/27/2004  . INFLUENZA VACCINE  12/24/2016  . TETANUS/TDAP  01/04/2018  . DEXA SCAN  Completed   Immunization History  Administered Date(s) Administered  . Influenza Split 02/07/2013  . Influenza, High Dose Seasonal PF 03/14/2015, 03/24/2016  . Td 01/05/2008   Past Surgical History:  Procedure Laterality Date  . ABDOMINAL HYSTERECTOMY     1980  . APPENDECTOMY  1977  . APPLICATION OF  INTRAOPERATIVE CT SCAN N/A 10/20/2014   Procedure: APPLICATION OF INTRAOPERATIVE CAT SCAN;  Surgeon: [name], MD;  Location: [facility];  Service: Neurosurgery;  Laterality: N/A;  . BREAST SURGERY Right 1985  . THYROID CYST EXCISION  1967   Family History  Problem Relation Age of Onset  . Heart disease Mother   . Hypertension Mother   . Heart disease Father   . Diabetes Father   . Breast cancer Paternal Aunt        4 paternal aunts with breast cancer over 40  . Prostate cancer Paternal Uncle   . Stroke Paternal Grandfather   . Ovarian cancer Paternal Aunt   . Huntington's disease Other        Nephew, inherited from his father   Social History  Substance Use Topics  . Smoking status: Former Smoker    Packs/day: 0.50    Years: 10.00    Types: Cigarettes  . Smokeless tobacco:  Former Systems developer     Comment: quit smoking 1970's  . Alcohol use 0.0 oz/week     Comment: Rare    ROS Constitutional: Denies fever, chills, weight loss/gain, headaches, insomnia,  night sweats, and change in appetite. Does c/o fatigue. Eyes: Denies redness, blurred vision, diplopia, discharge, itchy, watery eyes.  ENT: Denies discharge, congestion, post nasal drip, epistaxis, sore throat, earache, hearing loss, dental pain, Tinnitus, Vertigo, Sinus pain, snoring.  Cardio: Denies chest pain, palpitations, irregular heartbeat, syncope, dyspnea, diaphoresis, orthopnea, PND, claudication, edema Respiratory: denies cough, dyspnea, DOE, pleurisy, hoarseness, laryngitis, wheezing.  Gastrointestinal: Denies dysphagia, heartburn, reflux, water brash, pain, cramps, nausea, vomiting, bloating, diarrhea, constipation, hematemesis, melena, hematochezia, jaundice, hemorrhoids Genitourinary: Denies dysuria, frequency, urgency, nocturia, hesitancy, discharge, hematuria, flank pain Breast: Breast lumps, nipple discharge, bleeding.  Musculoskeletal: Denies arthralgia, myalgia, stiffness, Jt. Swelling, pain, limp, and strain/sprain. Denies falls. Skin: Denies puritis, rash, hives, warts, acne, eczema, changing in skin lesion Neuro: No weakness, tremor, incoordination, spasms, paresthesia, pain Psychiatric: Denies confusion, memory loss, sensory loss. Denies Depression. Endocrine: Denies change in weight, skin, hair change, nocturia, and paresthesia, diabetic polys, visual blurring, hyper / hypo glycemic episodes.  Heme/Lymph: No excessive bleeding, bruising, enlarged lymph nodes.  Physical Exam  BP 126/80   Pulse 72   Temp 98.2 F (36.8 C)   Resp 16   Ht 5' 1.5" (1.562 m)   Wt 121 lb 6.4 oz (55.1 kg)   BMI 22.57 kg/m   General Appearance: Well nourished, well groomed and in no apparent distress.  Eyes: PERRLA, EOMs, conjunctiva no swelling or erythema, normal fundi and vessels. Sinuses: No  frontal/maxillary tenderness ENT/Mouth: EACs patent / TMs  nl. Nares clear without erythema, swelling, mucoid exudates. Oral hygiene is good. No erythema, swelling, or exudate. Tongue normal, non-obstructing. Tonsils not swollen or erythematous. Hearing normal.  Neck: Supple, thyroid normal. No bruits, nodes or JVD. Respiratory: Respiratory effort normal.  BS equal and clear bilateral without rales, rhonci, wheezing or stridor. Cardio: Heart sounds are normal with regular rate and rhythm and no murmurs, rubs or gallops. Peripheral pulses are normal and equal bilaterally without edema. No aortic or femoral bruits. Chest:  Moderate gibbous deformity at T3 Breasts: Symmetric, without lumps, nipple discharge, retractions, or fibrocystic changes.  Abdomen: Flat, soft with bowel sounds active. Nontender, no guarding, rebound, hernias, masses, or organomegaly.  Lymphatics: Non tender without lymphadenopathy.  Musculoskeletal: Full ROM all peripheral extremities, joint stability, 5/5 strength and normal gait. Skin: Warm and dry without rashes, lesions, cyanosis, clubbing or  ecchymosis.  Neuro: Cranial nerves intact, reflexes equal bilaterally. Normal muscle tone, no cerebellar symptoms. Sensation intact.  Pysch: Alert and oriented X 3, normal affect, Insight and Judgment appropriate.   Assessment and Plan  1. Labile hypertension  - EKG 12-Lead - Urinalysis, Routine w reflex microscopic - Microalbumin / creatinine urine ratio - Magnesium - TSH  2. Hyperlipidemia, mixed  - EKG 12-Lead - TSH  3. Prediabetes  - EKG 12-Lead - Hemoglobin A1c - Insulin, random  4. Vitamin D deficiency  - VITAMIN D 25 Hydroxy   5. Other abnormal glucose  - Hemoglobin A1c - Insulin, random  6. Metastatic breast cancer ([place])   7. Screening for rectal cancer  - POC Hemoccult Bld/Stl   8. Screening for ischemic heart disease  - EKG 12-Lead  9. Medication management  - Urinalysis, Routine w  reflex microscopic - Microalbumin / creatinine urine ratio - Magnesium - TSH - Hemoglobin A1c - Insulin, random - VITAMIN D 25 Hydroxy        Patient was counseled in prudent diet to achieve/maintain BMI less than 25 for weight control, BP monitoring, regular exercise and medications. Discussed med's effects and SE's. Screening labs and tests as requested with regular follow-up as recommended. Over 40 minutes of exam, counseling, chart review and high complex critical decision making was performed.

## 2016-10-31 NOTE — Patient Instructions (Signed)

## 2016-11-01 ENCOUNTER — Encounter: Payer: Self-pay | Admitting: Internal Medicine

## 2016-11-04 ENCOUNTER — Ambulatory Visit (HOSPITAL_BASED_OUTPATIENT_CLINIC_OR_DEPARTMENT_OTHER): Payer: Medicare Other

## 2016-11-04 ENCOUNTER — Other Ambulatory Visit (HOSPITAL_BASED_OUTPATIENT_CLINIC_OR_DEPARTMENT_OTHER): Payer: Medicare Other

## 2016-11-04 VITALS — BP 145/72 | HR 82 | Temp 97.4°F | Resp 18

## 2016-11-04 DIAGNOSIS — C7951 Secondary malignant neoplasm of bone: Secondary | ICD-10-CM | POA: Diagnosis not present

## 2016-11-04 DIAGNOSIS — C50412 Malignant neoplasm of upper-outer quadrant of left female breast: Secondary | ICD-10-CM

## 2016-11-04 DIAGNOSIS — C50912 Malignant neoplasm of unspecified site of left female breast: Secondary | ICD-10-CM

## 2016-11-04 LAB — COMPREHENSIVE METABOLIC PANEL
ALBUMIN: 3.8 g/dL (ref 3.5–5.0)
ALK PHOS: 41 U/L (ref 40–150)
ALT: 16 U/L (ref 0–55)
ANION GAP: 6 meq/L (ref 3–11)
AST: 24 U/L (ref 5–34)
BUN: 14.3 mg/dL (ref 7.0–26.0)
CALCIUM: 10.7 mg/dL — AB (ref 8.4–10.4)
CO2: 29 mEq/L (ref 22–29)
CREATININE: 0.7 mg/dL (ref 0.6–1.1)
Chloride: 104 mEq/L (ref 98–109)
EGFR: 81 mL/min/{1.73_m2} — ABNORMAL LOW (ref >90)
Glucose: 78 mg/dl (ref 70–140)
POTASSIUM: 4.3 meq/L (ref 3.5–5.1)
Sodium: 140 mEq/L (ref 136–145)
Total Bilirubin: 0.37 mg/dL (ref 0.20–1.20)
Total Protein: 7.1 g/dL (ref 6.4–8.3)

## 2016-11-04 LAB — CBC WITH DIFFERENTIAL/PLATELET
BASO%: 1 % (ref 0.0–2.0)
BASOS ABS: 0 10*3/uL (ref 0.0–0.1)
EOS ABS: 0.2 10*3/uL (ref 0.0–0.5)
EOS%: 3.2 % (ref 0.0–7.0)
HEMATOCRIT: 45.4 % (ref 34.8–46.6)
HEMOGLOBIN: 15.3 g/dL (ref 11.6–15.9)
LYMPH#: 1.1 10*3/uL (ref 0.9–3.3)
LYMPH%: 22.2 % (ref 14.0–49.7)
MCH: 31 pg (ref 25.1–34.0)
MCHC: 33.8 g/dL (ref 31.5–36.0)
MCV: 91.8 fL (ref 79.5–101.0)
MONO#: 0.4 10*3/uL (ref 0.1–0.9)
MONO%: 8.3 % (ref 0.0–14.0)
NEUT#: 3.2 10*3/uL (ref 1.5–6.5)
NEUT%: 65.3 % (ref 38.4–76.8)
PLATELETS: 230 10*3/uL (ref 145–400)
RBC: 4.94 10*6/uL (ref 3.70–5.45)
RDW: 13.4 % (ref 11.2–14.5)
WBC: 4.9 10*3/uL (ref 3.9–10.3)

## 2016-11-04 MED ORDER — DENOSUMAB 120 MG/1.7ML ~~LOC~~ SOLN
120.0000 mg | Freq: Once | SUBCUTANEOUS | Status: AC
  Administered 2016-11-04: 120 mg via SUBCUTANEOUS
  Filled 2016-11-04: qty 1.7

## 2016-11-04 NOTE — Patient Instructions (Signed)
Denosumab injection What is this medicine? DENOSUMAB (den oh sue mab) slows bone breakdown. Prolia is used to treat osteoporosis in women after menopause and in men. Xgeva is used to prevent bone fractures and other bone problems caused by cancer bone metastases. Xgeva is also used to treat giant cell tumor of the bone. This medicine may be used for other purposes; ask your health care provider or pharmacist if you have questions. COMMON BRAND NAME(S): Prolia, XGEVA What should I tell my health care provider before I take this medicine? They need to know if you have any of these conditions: -dental disease -eczema -infection or history of infections -kidney disease or on dialysis -low blood calcium or vitamin D -malabsorption syndrome -scheduled to have surgery or tooth extraction -taking medicine that contains denosumab -thyroid or parathyroid disease -an unusual reaction to denosumab, other medicines, foods, dyes, or preservatives -pregnant or trying to get pregnant -breast-feeding How should I use this medicine? This medicine is for injection under the skin. It is given by a health care professional in a hospital or clinic setting. If you are getting Prolia, a special MedGuide will be given to you by the pharmacist with each prescription and refill. Be sure to read this information carefully each time. For Prolia, talk to your pediatrician regarding the use of this medicine in children. Special care may be needed. For Xgeva, talk to your pediatrician regarding the use of this medicine in children. While this drug may be prescribed for children as young as 13 years for selected conditions, precautions do apply. Overdosage: If you think you've taken too much of this medicine contact a poison control center or emergency room at once. Overdosage: If you think you have taken too much of this medicine contact a poison control center or emergency room at once. NOTE: This medicine is only for  you. Do not share this medicine with others. What if I miss a dose? It is important not to miss your dose. Call your doctor or health care professional if you are unable to keep an appointment. What may interact with this medicine? Do not take this medicine with any of the following medications: -other medicines containing denosumab This medicine may also interact with the following medications: -medicines that suppress the immune system -medicines that treat cancer -steroid medicines like prednisone or cortisone This list may not describe all possible interactions. Give your health care provider a list of all the medicines, herbs, non-prescription drugs, or dietary supplements you use. Also tell them if you smoke, drink alcohol, or use illegal drugs. Some items may interact with your medicine. What should I watch for while using this medicine? Visit your doctor or health care professional for regular checks on your progress. Your doctor or health care professional may order blood tests and other tests to see how you are doing. Call your doctor or health care professional if you get a cold or other infection while receiving this medicine. Do not treat yourself. This medicine may decrease your body's ability to fight infection. You should make sure you get enough calcium and vitamin D while you are taking this medicine, unless your doctor tells you not to. Discuss the foods you eat and the vitamins you take with your health care professional. See your dentist regularly. Brush and floss your teeth as directed. Before you have any dental work done, tell your dentist you are receiving this medicine. Do not become pregnant while taking this medicine or for 5 months after stopping   it. Women should inform their doctor if they wish to become pregnant or think they might be pregnant. There is a potential for serious side effects to an unborn child. Talk to your health care professional or pharmacist for more  information. What side effects may I notice from receiving this medicine? Side effects that you should report to your doctor or health care professional as soon as possible: -allergic reactions like skin rash, itching or hives, swelling of the face, lips, or tongue -breathing problems -chest pain -fast, irregular heartbeat -feeling faint or lightheaded, falls -fever, chills, or any other sign of infection -muscle spasms, tightening, or twitches -numbness or tingling -skin blisters or bumps, or is dry, peels, or red -slow healing or unexplained pain in the mouth or jaw -unusual bleeding or bruising Side effects that usually do not require medical attention (Report these to your doctor or health care professional if they continue or are bothersome.): -muscle pain -stomach upset, gas This list may not describe all possible side effects. Call your doctor for medical advice about side effects. You may report side effects to FDA at 1-800-FDA-1088. Where should I keep my medicine? This medicine is only given in a clinic, doctor's office, or other health care setting and will not be stored at home. NOTE: This sheet is a summary. It may not cover all possible information. If you have questions about this medicine, talk to your doctor, pharmacist, or health care provider.  2015, Elsevier/Gold Standard. (2011-11-10 12:37:47)  

## 2016-11-12 MED ORDER — DEXAMETHASONE SODIUM PHOSPHATE 10 MG/ML IJ SOLN
INTRAMUSCULAR | Status: AC
  Filled 2016-11-12: qty 1

## 2016-11-17 ENCOUNTER — Other Ambulatory Visit: Payer: Self-pay | Admitting: Oncology

## 2016-11-18 ENCOUNTER — Telehealth: Payer: Self-pay | Admitting: *Deleted

## 2016-11-18 MED ORDER — LETROZOLE 2.5 MG PO TABS: ORAL_TABLET | ORAL | 0 refills | Status: DC

## 2016-11-18 NOTE — Telephone Encounter (Signed)
Refill sent for letrozole

## 2016-12-02 ENCOUNTER — Other Ambulatory Visit (HOSPITAL_BASED_OUTPATIENT_CLINIC_OR_DEPARTMENT_OTHER): Payer: Medicare Other

## 2016-12-02 ENCOUNTER — Ambulatory Visit (HOSPITAL_BASED_OUTPATIENT_CLINIC_OR_DEPARTMENT_OTHER): Payer: Medicare Other

## 2016-12-02 VITALS — BP 148/88 | HR 90 | Temp 97.0°F | Resp 20

## 2016-12-02 DIAGNOSIS — C50412 Malignant neoplasm of upper-outer quadrant of left female breast: Secondary | ICD-10-CM

## 2016-12-02 DIAGNOSIS — C7951 Secondary malignant neoplasm of bone: Secondary | ICD-10-CM | POA: Diagnosis not present

## 2016-12-02 DIAGNOSIS — M810 Age-related osteoporosis without current pathological fracture: Secondary | ICD-10-CM

## 2016-12-02 DIAGNOSIS — C50919 Malignant neoplasm of unspecified site of unspecified female breast: Secondary | ICD-10-CM

## 2016-12-02 DIAGNOSIS — C50912 Malignant neoplasm of unspecified site of left female breast: Secondary | ICD-10-CM

## 2016-12-02 LAB — CBC WITH DIFFERENTIAL/PLATELET
BASO%: 0.8 % (ref 0.0–2.0)
BASOS ABS: 0 10*3/uL (ref 0.0–0.1)
EOS%: 2.6 % (ref 0.0–7.0)
Eosinophils Absolute: 0.1 10*3/uL (ref 0.0–0.5)
HEMATOCRIT: 44.3 % (ref 34.8–46.6)
HGB: 15.1 g/dL (ref 11.6–15.9)
LYMPH#: 1.4 10*3/uL (ref 0.9–3.3)
LYMPH%: 27.6 % (ref 14.0–49.7)
MCH: 31.2 pg (ref 25.1–34.0)
MCHC: 34.1 g/dL (ref 31.5–36.0)
MCV: 91.5 fL (ref 79.5–101.0)
MONO#: 0.4 10*3/uL (ref 0.1–0.9)
MONO%: 7.7 % (ref 0.0–14.0)
NEUT#: 3 10*3/uL (ref 1.5–6.5)
NEUT%: 61.3 % (ref 38.4–76.8)
Platelets: 212 10*3/uL (ref 145–400)
RBC: 4.84 10*6/uL (ref 3.70–5.45)
RDW: 13.7 % (ref 11.2–14.5)
WBC: 4.9 10*3/uL (ref 3.9–10.3)

## 2016-12-02 LAB — COMPREHENSIVE METABOLIC PANEL
ALT: 15 U/L (ref 0–55)
ANION GAP: 7 meq/L (ref 3–11)
AST: 20 U/L (ref 5–34)
Albumin: 3.8 g/dL (ref 3.5–5.0)
Alkaline Phosphatase: 41 U/L (ref 40–150)
BUN: 14 mg/dL (ref 7.0–26.0)
CALCIUM: 10.6 mg/dL — AB (ref 8.4–10.4)
CHLORIDE: 104 meq/L (ref 98–109)
CO2: 30 meq/L — AB (ref 22–29)
Creatinine: 0.7 mg/dL (ref 0.6–1.1)
EGFR: 83 mL/min/{1.73_m2} — ABNORMAL LOW (ref >90)
Glucose: 75 mg/dl (ref 70–140)
POTASSIUM: 4.4 meq/L (ref 3.5–5.1)
Sodium: 141 mEq/L (ref 136–145)
Total Bilirubin: 0.41 mg/dL (ref 0.20–1.20)
Total Protein: 7.1 g/dL (ref 6.4–8.3)

## 2016-12-02 MED ORDER — DENOSUMAB 120 MG/1.7ML ~~LOC~~ SOLN
120.0000 mg | Freq: Once | SUBCUTANEOUS | Status: AC
  Administered 2016-12-02: 120 mg via SUBCUTANEOUS
  Filled 2016-12-02: qty 1.7

## 2016-12-02 NOTE — Patient Instructions (Signed)
Denosumab injection What is this medicine? DENOSUMAB (den oh sue mab) slows bone breakdown. Prolia is used to treat osteoporosis in women after menopause and in men. Xgeva is used to prevent bone fractures and other bone problems caused by cancer bone metastases. Xgeva is also used to treat giant cell tumor of the bone. This medicine may be used for other purposes; ask your health care provider or pharmacist if you have questions. COMMON BRAND NAME(S): Prolia, XGEVA What should I tell my health care provider before I take this medicine? They need to know if you have any of these conditions: -dental disease -eczema -infection or history of infections -kidney disease or on dialysis -low blood calcium or vitamin D -malabsorption syndrome -scheduled to have surgery or tooth extraction -taking medicine that contains denosumab -thyroid or parathyroid disease -an unusual reaction to denosumab, other medicines, foods, dyes, or preservatives -pregnant or trying to get pregnant -breast-feeding How should I use this medicine? This medicine is for injection under the skin. It is given by a health care professional in a hospital or clinic setting. If you are getting Prolia, a special MedGuide will be given to you by the pharmacist with each prescription and refill. Be sure to read this information carefully each time. For Prolia, talk to your pediatrician regarding the use of this medicine in children. Special care may be needed. For Xgeva, talk to your pediatrician regarding the use of this medicine in children. While this drug may be prescribed for children as young as 13 years for selected conditions, precautions do apply. Overdosage: If you think you've taken too much of this medicine contact a poison control center or emergency room at once. Overdosage: If you think you have taken too much of this medicine contact a poison control center or emergency room at once. NOTE: This medicine is only for  you. Do not share this medicine with others. What if I miss a dose? It is important not to miss your dose. Call your doctor or health care professional if you are unable to keep an appointment. What may interact with this medicine? Do not take this medicine with any of the following medications: -other medicines containing denosumab This medicine may also interact with the following medications: -medicines that suppress the immune system -medicines that treat cancer -steroid medicines like prednisone or cortisone This list may not describe all possible interactions. Give your health care provider a list of all the medicines, herbs, non-prescription drugs, or dietary supplements you use. Also tell them if you smoke, drink alcohol, or use illegal drugs. Some items may interact with your medicine. What should I watch for while using this medicine? Visit your doctor or health care professional for regular checks on your progress. Your doctor or health care professional may order blood tests and other tests to see how you are doing. Call your doctor or health care professional if you get a cold or other infection while receiving this medicine. Do not treat yourself. This medicine may decrease your body's ability to fight infection. You should make sure you get enough calcium and vitamin D while you are taking this medicine, unless your doctor tells you not to. Discuss the foods you eat and the vitamins you take with your health care professional. See your dentist regularly. Brush and floss your teeth as directed. Before you have any dental work done, tell your dentist you are receiving this medicine. Do not become pregnant while taking this medicine or for 5 months after stopping   it. Women should inform their doctor if they wish to become pregnant or think they might be pregnant. There is a potential for serious side effects to an unborn child. Talk to your health care professional or pharmacist for more  information. What side effects may I notice from receiving this medicine? Side effects that you should report to your doctor or health care professional as soon as possible: -allergic reactions like skin rash, itching or hives, swelling of the face, lips, or tongue -breathing problems -chest pain -fast, irregular heartbeat -feeling faint or lightheaded, falls -fever, chills, or any other sign of infection -muscle spasms, tightening, or twitches -numbness or tingling -skin blisters or bumps, or is dry, peels, or red -slow healing or unexplained pain in the mouth or jaw -unusual bleeding or bruising Side effects that usually do not require medical attention (Report these to your doctor or health care professional if they continue or are bothersome.): -muscle pain -stomach upset, gas This list may not describe all possible side effects. Call your doctor for medical advice about side effects. You may report side effects to FDA at 1-800-FDA-1088. Where should I keep my medicine? This medicine is only given in a clinic, doctor's office, or other health care setting and will not be stored at home. NOTE: This sheet is a summary. It may not cover all possible information. If you have questions about this medicine, talk to your doctor, pharmacist, or health care provider.  2015, Elsevier/Gold Standard. (2011-11-10 12:37:47)  

## 2016-12-08 ENCOUNTER — Other Ambulatory Visit: Payer: Self-pay

## 2016-12-30 ENCOUNTER — Other Ambulatory Visit: Payer: Self-pay | Admitting: Oncology

## 2016-12-30 ENCOUNTER — Ambulatory Visit (HOSPITAL_BASED_OUTPATIENT_CLINIC_OR_DEPARTMENT_OTHER): Payer: Medicare Other

## 2016-12-30 ENCOUNTER — Ambulatory Visit (HOSPITAL_BASED_OUTPATIENT_CLINIC_OR_DEPARTMENT_OTHER): Payer: Medicare Other | Admitting: Oncology

## 2016-12-30 ENCOUNTER — Other Ambulatory Visit (HOSPITAL_BASED_OUTPATIENT_CLINIC_OR_DEPARTMENT_OTHER): Payer: Medicare Other

## 2016-12-30 VITALS — BP 138/72 | HR 82 | Temp 97.4°F | Resp 18 | Ht 61.5 in | Wt 123.2 lb

## 2016-12-30 DIAGNOSIS — R7303 Prediabetes: Secondary | ICD-10-CM | POA: Diagnosis not present

## 2016-12-30 DIAGNOSIS — C7951 Secondary malignant neoplasm of bone: Secondary | ICD-10-CM | POA: Diagnosis not present

## 2016-12-30 DIAGNOSIS — Z170 Estrogen receptor positive status [ER+]: Secondary | ICD-10-CM | POA: Diagnosis not present

## 2016-12-30 DIAGNOSIS — Z1212 Encounter for screening for malignant neoplasm of rectum: Secondary | ICD-10-CM | POA: Diagnosis not present

## 2016-12-30 DIAGNOSIS — Z136 Encounter for screening for cardiovascular disorders: Secondary | ICD-10-CM | POA: Diagnosis not present

## 2016-12-30 DIAGNOSIS — Z79899 Other long term (current) drug therapy: Secondary | ICD-10-CM | POA: Diagnosis not present

## 2016-12-30 DIAGNOSIS — M810 Age-related osteoporosis without current pathological fracture: Secondary | ICD-10-CM

## 2016-12-30 DIAGNOSIS — E559 Vitamin D deficiency, unspecified: Secondary | ICD-10-CM | POA: Diagnosis not present

## 2016-12-30 DIAGNOSIS — C50919 Malignant neoplasm of unspecified site of unspecified female breast: Secondary | ICD-10-CM | POA: Diagnosis not present

## 2016-12-30 DIAGNOSIS — R7309 Other abnormal glucose: Secondary | ICD-10-CM | POA: Diagnosis not present

## 2016-12-30 DIAGNOSIS — C50412 Malignant neoplasm of upper-outer quadrant of left female breast: Secondary | ICD-10-CM

## 2016-12-30 DIAGNOSIS — E782 Mixed hyperlipidemia: Secondary | ICD-10-CM | POA: Diagnosis not present

## 2016-12-30 DIAGNOSIS — C50912 Malignant neoplasm of unspecified site of left female breast: Secondary | ICD-10-CM

## 2016-12-30 DIAGNOSIS — E041 Nontoxic single thyroid nodule: Secondary | ICD-10-CM | POA: Diagnosis not present

## 2016-12-30 DIAGNOSIS — R0989 Other specified symptoms and signs involving the circulatory and respiratory systems: Secondary | ICD-10-CM | POA: Diagnosis not present

## 2016-12-30 LAB — CBC WITH DIFFERENTIAL/PLATELET
BASO%: 0.7 % (ref 0.0–2.0)
Basophils Absolute: 0 10*3/uL (ref 0.0–0.1)
EOS%: 4.5 % (ref 0.0–7.0)
Eosinophils Absolute: 0.2 10*3/uL (ref 0.0–0.5)
HEMATOCRIT: 42.7 % (ref 34.8–46.6)
HEMOGLOBIN: 14.6 g/dL (ref 11.6–15.9)
LYMPH#: 1 10*3/uL (ref 0.9–3.3)
LYMPH%: 23.1 % (ref 14.0–49.7)
MCH: 31.3 pg (ref 25.1–34.0)
MCHC: 34.2 g/dL (ref 31.5–36.0)
MCV: 91.6 fL (ref 79.5–101.0)
MONO#: 0.4 10*3/uL (ref 0.1–0.9)
MONO%: 9 % (ref 0.0–14.0)
NEUT%: 62.7 % (ref 38.4–76.8)
NEUTROS ABS: 2.8 10*3/uL (ref 1.5–6.5)
NRBC: 0 % (ref 0–0)
Platelets: 224 10*3/uL (ref 145–400)
RBC: 4.66 10*6/uL (ref 3.70–5.45)
RDW: 14.1 % (ref 11.2–14.5)
WBC: 4.5 10*3/uL (ref 3.9–10.3)

## 2016-12-30 LAB — COMPREHENSIVE METABOLIC PANEL
ALBUMIN: 3.7 g/dL (ref 3.5–5.0)
ALT: 15 U/L (ref 0–55)
AST: 22 U/L (ref 5–34)
Alkaline Phosphatase: 42 U/L (ref 40–150)
Anion Gap: 9 mEq/L (ref 3–11)
BILIRUBIN TOTAL: 0.32 mg/dL (ref 0.20–1.20)
BUN: 14.9 mg/dL (ref 7.0–26.0)
CALCIUM: 10.6 mg/dL — AB (ref 8.4–10.4)
CHLORIDE: 104 meq/L (ref 98–109)
CO2: 28 mEq/L (ref 22–29)
CREATININE: 0.7 mg/dL (ref 0.6–1.1)
EGFR: 83 mL/min/{1.73_m2} — ABNORMAL LOW (ref >90)
Glucose: 93 mg/dl (ref 70–140)
Potassium: 4.3 mEq/L (ref 3.5–5.1)
Sodium: 141 mEq/L (ref 136–145)
TOTAL PROTEIN: 7 g/dL (ref 6.4–8.3)

## 2016-12-30 MED ORDER — DENOSUMAB 120 MG/1.7ML ~~LOC~~ SOLN
120.0000 mg | Freq: Once | SUBCUTANEOUS | Status: AC
  Administered 2016-12-30: 120 mg via SUBCUTANEOUS
  Filled 2016-12-30: qty 1.7

## 2016-12-30 NOTE — Progress Notes (Signed)
[facility]  Telephone:(336) [phone] Fax:(336) [phone]   ID: April Rosales DOB: April 09, 1941  MR#: [id number]  XJO#:[id number]  Patient Care Team: [name], MD as PCP - General (Internal Medicine) Magrinat, [name], MD as Consulting Physician (Oncology) PCP: [name], MD GYN: SU: [name] M.D. OTHER MD:  Rodell Perna M.D., [name] MD, Stephannie Peters M.D.  CHIEF COMPLAINT: Stage IV estrogen receptor positive breast cancer  CURRENT TREATMENT:  Letrozole; denosumab/Xgeva  INTERVAL HISTORY: April Rosales returns today for follow-up and treatment of her estrogen receptor positive breast cancer accompanied by her sister. She continues on letrozole, with good tolerance. Hot flashes and vaginal dryness are not a major issue. She never developed the arthralgias or myalgias that many patients can experience on this medication. She obtains it at a good price.  She also receives denosumab/Xgeva every 4 weeks, with a dose due today.  Since her last visit here she had a repeat mammography with left breast ultrasonography. The previously noted breast mass is unchanged as compared to a year ago.  REVIEW OF SYSTEMS: April Rosales generally feels well. She is not active, tells me she walks and exercises as little as possible, and enjoys reading and other sedentary activities. On the plus side there has been no pain, fever, bleeding, rash, unexplained weight loss or unexplained fatigue. A detailed review of systems today was stable  BREAST CANCER HISTORY: From the original intake note:   April Rosales underwent right lumpectomy in [facility] at [facility] for what sounds like a stage I breast cancer. She tells me she had more than 30 lymph nodes removed from her right axilla and all of them were clear. She received  adjuvant radiation but no systemic treatment.  The patient had recently refused mammography with the last mammogram I can find dating back to August  2010.  More recently the patient presented with left scapular pain radiating down the left arm.  She was evaluated by Dr. [name], who obtained a chest x-ray showing a possible abnormality at T3. He then set up the patient for an MRI of the thoracic spine performed 08/16/2014.  This showed multiple compression fractures  (a similar picture had been noted on lumbar MRI 10/09/2011 ). However at T3 they noted tumor in the vertebral body extending into the left pedicle and into the lateral epidural space, displacing the cord to the right. There was no evidence of cord compression or cord signal abnormality. There were no other areas of tumor identified in the thoracic spine.         The patient was then referred to Dr. Hal Neer who on 08/24/2014 set April Rosales up for CT scans of the chest, abdomen and pelvis. There was a dense mass in the upper inner quadrant of the left breast measuring 1.6 cm. There was a 1.2 cm nodule in the minor fissure of the right lung and some evidence of right lung fibrosis at the site of the prior radiation port.  There was also a thyroid mass measuring 2 cm. However there were no parenchymal lung or liver lesions. Incidental meningoceles were noted as well as sclerosis of the fifth and sixth ribs which were felt to be likely posttraumatic.   On 08/29/2014 the patient underwent bilateral diagnostic mammography with tomosynthesis and left breast ultrasonography.  There were postsurgical changes in the upper right breast. In the left breast there was an irregular mass measuring 2.3 cm in the upper inner quadrant. This was palpable. Ultrasound showed this to be  hypoechoic and to measure 2.0 cm. There were adjacent areas of nodularity. There was no definite lymphadenopathy in the left axilla.  Biopsy of this breast mass 08/29/2014 showed an invasive adenocarcinoma with both ductal and lobular features (there was strong diffuse E-cadherin expression as well as areas with total absence of  E-cadherin expression), with the preliminary prognostic profile showing strong estrogen positivity, very weak to near absent progesterone positivity, an MIB-1 of approximately 40%, and HER-2 equivocal  The patient's subsequent history is as detailed below.   PAST MEDICAL HISTORY: Past Medical History:  Diagnosis Date  . Arthritis   . Breast cancer [facility]) 1985/2016   takes [name] daily  . Chronic back pain    stenosis/listhesis  . Family history of ovarian cancer   . Osteoporosis    takes Vit D  . Radiation 11/23/14-12/11/14   30 Gy T1-T5     PAST SURGICAL HISTORY: Past Surgical History:  Procedure Laterality Date  . ABDOMINAL HYSTERECTOMY     1980  . APPENDECTOMY  1977  . APPLICATION OF INTRAOPERATIVE CT SCAN N/A 10/20/2014   Procedure: APPLICATION OF INTRAOPERATIVE CAT SCAN;  Surgeon: [name], MD;  Location: [facility];  Service: Neurosurgery;  Laterality: N/A;  . BREAST SURGERY Right 1985  . THYROID CYST EXCISION  1967    FAMILY HISTORY Family History  Problem Relation Age of Onset  . Heart disease Mother   . Hypertension Mother   . Heart disease Father   . Diabetes Father   . Breast cancer Paternal Aunt        4 paternal aunts with breast cancer over 28  . Prostate cancer Paternal Uncle   . Stroke Paternal Grandfather   . Ovarian cancer Paternal Aunt   . Huntington's disease Other        Nephew, inherited from his father   The patient's father died from a heart attack at the age of 34. He had 9 sisters. 3 of those sisters had breast cancer, all in a menopausal setting. Another sister had ovarian cancer. One of the paternal uncles had cancer of the colon "and back".  The patient's mother died at the age of 67. She was found to have breast cancer shortly before dying , during her final hospitalization.  GYNECOLOGIC HISTORY:  No LMP recorded. Patient has had a hysterectomy.  Menarche age 48, first live birth age 59, the patient is GX P1. She underwent  hysterectomy in 1980. She thinks the ovaries were removed, but the CT scan obtained 08/24/2014 showed a definite right ovary. The left ovary may have been removed. She did not take hormone replacement after the hysterectomy.  SOCIAL HISTORY:   Tykeria worked in Psychiatric nurse. She is divorced, lives alone, with 2 cats. Her son Bethann Berkshire lives in [place] where he works in Engineer, technical sales. He has 4 children of his own. The patient is a Psychologist, forensic.    ADVANCED DIRECTIVES:  In place. The patient has named her sister Pleas Koch [phone]) and her friend Janina Mayo [phone]) as joint healthcare powers of attorney   HEALTH MAINTENANCE: Social History  Substance Use Topics  . Smoking status: Former Smoker    Packs/day: 0.50    Years: 10.00    Types: Cigarettes  . Smokeless tobacco: Former Systems developer     Comment: quit smoking 1970's  . Alcohol use 0.0 oz/week     Comment: Rare     Colonoscopy: never  PAP: status post hysterectomy  Bone density: [facility]/14/2014 [facility], results pending  Lipid panel:  Allergies  Allergen Reactions  . Ativan [Lorazepam]     Made her crazy  . Dilaudid [Hydromorphone Hcl]     Doesn't want  . Haldol [Haloperidol Lactate]     Made her crazy    Current Outpatient Prescriptions  Medication Sig Dispense Refill  . Ascorbic Acid (VITAMIN C PO) Take 1 tablet by mouth every other day. Takes qod    . aspirin EC 325 MG tablet Take 1 tablet (325 mg total) by mouth daily. 30 tablet 0  . Cholecalciferol (VITAMIN D3 ADULT GUMMIES PO) Take 4,000 Units by mouth daily.    . Cyanocobalamin (VITAMIN B-12 PO) Take by mouth daily. Takes every other day    . gabapentin (NEURONTIN) 100 MG capsule   5  . gabapentin (NEURONTIN) 300 MG capsule Take 1 capsule (300 mg total) by mouth at bedtime. 30 capsule 1  . letrozole (FEMARA) 2.5 MG tablet TAKE 1 TABLET(2.5 MG) BY MOUTH DAILY 90 tablet 0   No current facility-administered medications for this visit.     OBJECTIVE:   Older white woman Who appears stated age In no acute distress Vitals:   12/30/16 1238  BP: 138/72  Pulse: 82  Resp: 18  Temp: (!) 97.4 F (36.3 C)     Body mass index is 22.9 kg/m.    ECOG FS:1 - Symptomatic but completely ambulatory  Sclerae unicteric, EOMs intact Oropharynx clear and moist No cervical or supraclavicular adenopathy Lungs no rales or rhonchi Heart regular rate and rhythm Abd soft, nontender, positive bowel sounds MSK kyphosis but no focal spinal tenderness, no upper extremity lymphedema Neuro: nonfocal, well oriented, appropriate affect Breasts: The right breast is status post remote lumpectomy, without evidence of local recurrence. The left breast is the site of the current active disease but there is no palpable mass. Both axillae are benign.  LAB RESULTS:  CMP     Component Value Date/Time   NA 141 12/30/2016 1210   K 4.3 12/30/2016 1210   CL 105 07/07/2016 1125   CO2 28 12/30/2016 1210   GLUCOSE 93 12/30/2016 1210   BUN 14.9 12/30/2016 1210   CREATININE 0.7 12/30/2016 1210   CALCIUM 10.6 (H) 12/30/2016 1210   PROT 7.0 12/30/2016 1210   ALBUMIN 3.7 12/30/2016 1210   AST 22 12/30/2016 1210   ALT 15 12/30/2016 1210   ALKPHOS 42 12/30/2016 1210   BILITOT 0.32 12/30/2016 1210   GFRNONAA 85 07/07/2016 1125   GFRAA >89 07/07/2016 1125    INo results found for: SPEP, UPEP  Lab Results  Component Value Date   WBC 4.5 12/30/2016   NEUTROABS 2.8 12/30/2016   HGB 14.6 12/30/2016   HCT 42.7 12/30/2016   MCV 91.6 12/30/2016   PLT 224 12/30/2016      Chemistry      Component Value Date/Time   NA 141 12/30/2016 1210   K 4.3 12/30/2016 1210   CL 105 07/07/2016 1125   CO2 28 12/30/2016 1210   BUN 14.9 12/30/2016 1210   CREATININE 0.7 12/30/2016 1210      Component Value Date/Time   CALCIUM 10.6 (H) 12/30/2016 1210   ALKPHOS 42 12/30/2016 1210   AST 22 12/30/2016 1210   ALT 15 12/30/2016 1210   BILITOT 0.32 12/30/2016 1210       Lab  Results  Component Value Date   LABCA2 24 01/01/2016    No components found for: [id number]  No results for input(s): INR in the last 168 hours.  Urinalysis    Component Value Date/Time   COLORURINE YELLOW 09/19/2015 1239   APPEARANCEUR CLOUDY (A) 09/19/2015 1239   LABSPEC 1.015 09/19/2015 1239   PHURINE 7.5 09/19/2015 1239   GLUCOSEU NEGATIVE 09/19/2015 1239   HGBUR NEGATIVE 09/19/2015 1239   BILIRUBINUR NEGATIVE 09/19/2015 1239   KETONESUR NEGATIVE 09/19/2015 1239   PROTEINUR NEGATIVE 09/19/2015 1239   NITRITE NEGATIVE 09/19/2015 1239   LEUKOCYTESUR NEGATIVE 09/19/2015 1239    STUDIES: Bilateral mammography with tomography at the [facility] 10/30/2016 found the breast density to be category C. On the right breast there was only postop changes. On the left there were no interval findings and no mass was palpable. Targeted ultrasonography of the left breast showed a 0.7 cm lesion in the 10:00 position a which is unchanged in size compared to June 2017.   ASSESSMENT: 76 y.o.  woman with stage IV left breast cancer involving bone  (1) status post right lumpectomy and axillary node dissection in 1985 followed by radiation at [facility] 08/16/2014: measurable disease in spine, lung and left breast   (2) evaluation for left shoulder pain led to thoracic spine MRI 08/16/2014 showing a pathologic fracture at T3 with epidural tumor displacing the cord to the right, but no cord compression. CT scans of the chest, abdomen and pelvis 08/24/2014 showed in addition a mass in the upper outer quadrant left breast measuring 1.6 cm and a nodule in the minor fissure of the right lung measuring 1.2 cm, but no parenchymal lung or liver lesions.   (a) CA 27-29 was noninformative at 38 (09/20/2014)    (3) mammography and ultrasonography 08/29/2014 show a mass in the upper inner left breast which was palpable,  measuring 2.0 cm by ultrasound. Biopsy of this mass 08/29/2014  showed an invasive breast cancer with both lobular and ductal features, estrogen receptor positive, progesterone receptor weakly positive, with an MIB-1 in the 40% range, HER-2 equivocal (6 else ratio 1.5, but average number her nucleus 5.8)    (4) letrozole started 08/31/2014;   (a) palbociclib added Sept 2016 at 75 mg 21/7, with significant neutropenia; not repeated after first cycle   (5)  zolendronate started 09/20/2014, stopped after initial dose due to poor tolerance  (a) denosumab/ Xgeva started 01/31/2015, repeated every 4 weeks, changed to every 8 weeks as of August 2018.    (6) on 10/20/2014 the patient underwent T2-T3 and T4 decompressive laminectomy with removal of epidural tumor, C7-T4 segmental pedicle screw instrumentation with virage screw system with arrow guidance protocol and C7-T4 posterolateral fusion. The cells were positive for the estrogen receptor. HER-2/neu testing by [facility] showed again equivocal results,  (a) most recent bone scan 10/01/2016 finds no new lesions only postoperative changes  (7) radiation 11/23/2014-12/11/2014.  (a) T1-T5 was treated to 30 Gy in 12 fractions at 2.5 Gy per fraction   (8)  consider eventual left lumpectomy or mastectomy depending on  longer-term results of systemic therapy  (a) most recent left breast ultrasonography 10/30/2016 found this mass to measure 0.7 cm  (9) genetics testing using the Breast/Ovarian Cancer Panel through [name], MD) found no deleterious mutations in ATM, BARD1, BRCA1, BRCA2, BRIP1, CDH1, CHEK2, EPCAM, FANCC, MLH1, MSH2, MSH6, NBN, PALB2, PMS2, PTEN, RAD51C, RAD51D, STK11, TP53, or XRCC2    (10) right thyroid nodule is a complex cyst as noted on CT scan of the neck 01/29/2016  (11) osteoporosis: [facility] density at [facility] 09/12/2016 showed a T score of -4.8.  (a) continue [place]  as above  PLAN: I spent approximately 30 minutes with April Rosales with most of that time spent discussing her intertwined problems.  Zayana is doing remarkably well on the current treatment. She is now 2 years out from her initiation of letrozole for metastatic disease. At the only site of measurable disease we have, which is the left breast, there has been no change over the past year.  She is asymptomatic as far as her breast cancer is concerned.  She is tolerating both the letrozole and the denosumab well although she would prefer to move the Xgeva treatments to every 8 weeks, which I think at this point is reasonable.  I again encouraged her to become more active. Walking is the very best thing she could do to further help her osteoporosis.  She will see me again in November. We will obtain a chest CT scan shortly before that visit.  She knows to call for any problems that may develop before then.       Chauncey Cruel, MD   12/30/2016 5:59 PM .

## 2016-12-30 NOTE — Patient Instructions (Signed)
Denosumab injection What is this medicine? DENOSUMAB (den oh sue mab) slows bone breakdown. Prolia is used to treat osteoporosis in women after menopause and in men. Xgeva is used to prevent bone fractures and other bone problems caused by cancer bone metastases. Xgeva is also used to treat giant cell tumor of the bone. This medicine may be used for other purposes; ask your health care provider or pharmacist if you have questions. COMMON BRAND NAME(S): Prolia, XGEVA What should I tell my health care provider before I take this medicine? They need to know if you have any of these conditions: -dental disease -eczema -infection or history of infections -kidney disease or on dialysis -low blood calcium or vitamin D -malabsorption syndrome -scheduled to have surgery or tooth extraction -taking medicine that contains denosumab -thyroid or parathyroid disease -an unusual reaction to denosumab, other medicines, foods, dyes, or preservatives -pregnant or trying to get pregnant -breast-feeding How should I use this medicine? This medicine is for injection under the skin. It is given by a health care professional in a hospital or clinic setting. If you are getting Prolia, a special MedGuide will be given to you by the pharmacist with each prescription and refill. Be sure to read this information carefully each time. For Prolia, talk to your pediatrician regarding the use of this medicine in children. Special care may be needed. For Xgeva, talk to your pediatrician regarding the use of this medicine in children. While this drug may be prescribed for children as young as 13 years for selected conditions, precautions do apply. Overdosage: If you think you've taken too much of this medicine contact a poison control center or emergency room at once. Overdosage: If you think you have taken too much of this medicine contact a poison control center or emergency room at once. NOTE: This medicine is only for  you. Do not share this medicine with others. What if I miss a dose? It is important not to miss your dose. Call your doctor or health care professional if you are unable to keep an appointment. What may interact with this medicine? Do not take this medicine with any of the following medications: -other medicines containing denosumab This medicine may also interact with the following medications: -medicines that suppress the immune system -medicines that treat cancer -steroid medicines like prednisone or cortisone This list may not describe all possible interactions. Give your health care provider a list of all the medicines, herbs, non-prescription drugs, or dietary supplements you use. Also tell them if you smoke, drink alcohol, or use illegal drugs. Some items may interact with your medicine. What should I watch for while using this medicine? Visit your doctor or health care professional for regular checks on your progress. Your doctor or health care professional may order blood tests and other tests to see how you are doing. Call your doctor or health care professional if you get a cold or other infection while receiving this medicine. Do not treat yourself. This medicine may decrease your body's ability to fight infection. You should make sure you get enough calcium and vitamin D while you are taking this medicine, unless your doctor tells you not to. Discuss the foods you eat and the vitamins you take with your health care professional. See your dentist regularly. Brush and floss your teeth as directed. Before you have any dental work done, tell your dentist you are receiving this medicine. Do not become pregnant while taking this medicine or for 5 months after stopping   it. Women should inform their doctor if they wish to become pregnant or think they might be pregnant. There is a potential for serious side effects to an unborn child. Talk to your health care professional or pharmacist for more  information. What side effects may I notice from receiving this medicine? Side effects that you should report to your doctor or health care professional as soon as possible: -allergic reactions like skin rash, itching or hives, swelling of the face, lips, or tongue -breathing problems -chest pain -fast, irregular heartbeat -feeling faint or lightheaded, falls -fever, chills, or any other sign of infection -muscle spasms, tightening, or twitches -numbness or tingling -skin blisters or bumps, or is dry, peels, or red -slow healing or unexplained pain in the mouth or jaw -unusual bleeding or bruising Side effects that usually do not require medical attention (Report these to your doctor or health care professional if they continue or are bothersome.): -muscle pain -stomach upset, gas This list may not describe all possible side effects. Call your doctor for medical advice about side effects. You may report side effects to FDA at 1-800-FDA-1088. Where should I keep my medicine? This medicine is only given in a clinic, doctor's office, or other health care setting and will not be stored at home. NOTE: This sheet is a summary. It may not cover all possible information. If you have questions about this medicine, talk to your doctor, pharmacist, or health care provider.  2015, Elsevier/Gold Standard. (2011-11-10 12:37:47)  

## 2017-01-02 LAB — SPECIMEN STATUS REPORT

## 2017-01-02 LAB — INSULIN, RANDOM: INSULIN: 12.8 u[IU]/mL (ref 2.6–24.9)

## 2017-01-02 LAB — HGB A1C W/O EAG: HEMOGLOBIN A1C: 5.4 % (ref 4.8–5.6)

## 2017-01-02 LAB — TSH: TSH: 1.82 u[IU]/mL (ref 0.450–4.500)

## 2017-01-02 LAB — MAGNESIUM: MAGNESIUM: 2.2 mg/dL (ref 1.6–2.3)

## 2017-01-06 ENCOUNTER — Encounter: Payer: Self-pay | Admitting: Internal Medicine

## 2017-02-18 ENCOUNTER — Other Ambulatory Visit: Payer: Self-pay | Admitting: Oncology

## 2017-02-24 ENCOUNTER — Ambulatory Visit (HOSPITAL_BASED_OUTPATIENT_CLINIC_OR_DEPARTMENT_OTHER): Payer: Medicare Other

## 2017-02-24 ENCOUNTER — Other Ambulatory Visit (HOSPITAL_BASED_OUTPATIENT_CLINIC_OR_DEPARTMENT_OTHER): Payer: Medicare Other

## 2017-02-24 ENCOUNTER — Other Ambulatory Visit: Payer: Self-pay | Admitting: *Deleted

## 2017-02-24 VITALS — BP 141/67 | HR 75 | Temp 97.7°F | Resp 18

## 2017-02-24 DIAGNOSIS — C50412 Malignant neoplasm of upper-outer quadrant of left female breast: Secondary | ICD-10-CM

## 2017-02-24 DIAGNOSIS — C50919 Malignant neoplasm of unspecified site of unspecified female breast: Secondary | ICD-10-CM

## 2017-02-24 DIAGNOSIS — C7951 Secondary malignant neoplasm of bone: Secondary | ICD-10-CM | POA: Diagnosis not present

## 2017-02-24 DIAGNOSIS — C50912 Malignant neoplasm of unspecified site of left female breast: Secondary | ICD-10-CM

## 2017-02-24 DIAGNOSIS — M810 Age-related osteoporosis without current pathological fracture: Secondary | ICD-10-CM

## 2017-02-24 LAB — CBC WITH DIFFERENTIAL/PLATELET
BASO%: 0.7 % (ref 0.0–2.0)
Basophils Absolute: 0 10*3/uL (ref 0.0–0.1)
EOS%: 2.4 % (ref 0.0–7.0)
Eosinophils Absolute: 0.1 10*3/uL (ref 0.0–0.5)
HCT: 43.7 % (ref 34.8–46.6)
HEMOGLOBIN: 14.7 g/dL (ref 11.6–15.9)
LYMPH%: 22.5 % (ref 14.0–49.7)
MCH: 31.2 pg (ref 25.1–34.0)
MCHC: 33.6 g/dL (ref 31.5–36.0)
MCV: 92.8 fL (ref 79.5–101.0)
MONO#: 0.5 10*3/uL (ref 0.1–0.9)
MONO%: 9.1 % (ref 0.0–14.0)
NEUT%: 65.3 % (ref 38.4–76.8)
NEUTROS ABS: 3.6 10*3/uL (ref 1.5–6.5)
Platelets: 227 10*3/uL (ref 145–400)
RBC: 4.71 10*6/uL (ref 3.70–5.45)
RDW: 14 % (ref 11.2–14.5)
WBC: 5.5 10*3/uL (ref 3.9–10.3)
lymph#: 1.2 10*3/uL (ref 0.9–3.3)

## 2017-02-24 LAB — COMPREHENSIVE METABOLIC PANEL
ALBUMIN: 3.8 g/dL (ref 3.5–5.0)
ALK PHOS: 40 U/L (ref 40–150)
ALT: 14 U/L (ref 0–55)
AST: 24 U/L (ref 5–34)
Anion Gap: 7 mEq/L (ref 3–11)
BILIRUBIN TOTAL: 0.33 mg/dL (ref 0.20–1.20)
BUN: 15.3 mg/dL (ref 7.0–26.0)
CO2: 28 mEq/L (ref 22–29)
CREATININE: 0.7 mg/dL (ref 0.6–1.1)
Calcium: 11 mg/dL — ABNORMAL HIGH (ref 8.4–10.4)
Chloride: 107 mEq/L (ref 98–109)
EGFR: 78 mL/min/{1.73_m2} — ABNORMAL LOW (ref >90)
Glucose: 72 mg/dl (ref 70–140)
POTASSIUM: 5.2 meq/L — AB (ref 3.5–5.1)
Sodium: 142 mEq/L (ref 136–145)
Total Protein: 7.2 g/dL (ref 6.4–8.3)

## 2017-02-24 MED ORDER — GABAPENTIN 100 MG PO CAPS: 200.0000 mg | ORAL_CAPSULE | Freq: Every day | ORAL | 5 refills | Status: DC

## 2017-02-24 MED ORDER — DENOSUMAB 120 MG/1.7ML ~~LOC~~ SOLN
120.0000 mg | Freq: Once | SUBCUTANEOUS | Status: AC
  Administered 2017-02-24: 120 mg via SUBCUTANEOUS
  Filled 2017-02-24: qty 1.7

## 2017-02-24 NOTE — Patient Instructions (Signed)
Denosumab injection What is this medicine? DENOSUMAB (den oh sue mab) slows bone breakdown. Prolia is used to treat osteoporosis in women after menopause and in men. Xgeva is used to prevent bone fractures and other bone problems caused by cancer bone metastases. Xgeva is also used to treat giant cell tumor of the bone. This medicine may be used for other purposes; ask your health care provider or pharmacist if you have questions. COMMON BRAND NAME(S): Prolia, XGEVA What should I tell my health care provider before I take this medicine? They need to know if you have any of these conditions: -dental disease -eczema -infection or history of infections -kidney disease or on dialysis -low blood calcium or vitamin D -malabsorption syndrome -scheduled to have surgery or tooth extraction -taking medicine that contains denosumab -thyroid or parathyroid disease -an unusual reaction to denosumab, other medicines, foods, dyes, or preservatives -pregnant or trying to get pregnant -breast-feeding How should I use this medicine? This medicine is for injection under the skin. It is given by a health care professional in a hospital or clinic setting. If you are getting Prolia, a special MedGuide will be given to you by the pharmacist with each prescription and refill. Be sure to read this information carefully each time. For Prolia, talk to your pediatrician regarding the use of this medicine in children. Special care may be needed. For Xgeva, talk to your pediatrician regarding the use of this medicine in children. While this drug may be prescribed for children as young as 13 years for selected conditions, precautions do apply. Overdosage: If you think you've taken too much of this medicine contact a poison control center or emergency room at once. Overdosage: If you think you have taken too much of this medicine contact a poison control center or emergency room at once. NOTE: This medicine is only for  you. Do not share this medicine with others. What if I miss a dose? It is important not to miss your dose. Call your doctor or health care professional if you are unable to keep an appointment. What may interact with this medicine? Do not take this medicine with any of the following medications: -other medicines containing denosumab This medicine may also interact with the following medications: -medicines that suppress the immune system -medicines that treat cancer -steroid medicines like prednisone or cortisone This list may not describe all possible interactions. Give your health care provider a list of all the medicines, herbs, non-prescription drugs, or dietary supplements you use. Also tell them if you smoke, drink alcohol, or use illegal drugs. Some items may interact with your medicine. What should I watch for while using this medicine? Visit your doctor or health care professional for regular checks on your progress. Your doctor or health care professional may order blood tests and other tests to see how you are doing. Call your doctor or health care professional if you get a cold or other infection while receiving this medicine. Do not treat yourself. This medicine may decrease your body's ability to fight infection. You should make sure you get enough calcium and vitamin D while you are taking this medicine, unless your doctor tells you not to. Discuss the foods you eat and the vitamins you take with your health care professional. See your dentist regularly. Brush and floss your teeth as directed. Before you have any dental work done, tell your dentist you are receiving this medicine. Do not become pregnant while taking this medicine or for 5 months after stopping   it. Women should inform their doctor if they wish to become pregnant or think they might be pregnant. There is a potential for serious side effects to an unborn child. Talk to your health care professional or pharmacist for more  information. What side effects may I notice from receiving this medicine? Side effects that you should report to your doctor or health care professional as soon as possible: -allergic reactions like skin rash, itching or hives, swelling of the face, lips, or tongue -breathing problems -chest pain -fast, irregular heartbeat -feeling faint or lightheaded, falls -fever, chills, or any other sign of infection -muscle spasms, tightening, or twitches -numbness or tingling -skin blisters or bumps, or is dry, peels, or red -slow healing or unexplained pain in the mouth or jaw -unusual bleeding or bruising Side effects that usually do not require medical attention (Report these to your doctor or health care professional if they continue or are bothersome.): -muscle pain -stomach upset, gas This list may not describe all possible side effects. Call your doctor for medical advice about side effects. You may report side effects to FDA at 1-800-FDA-1088. Where should I keep my medicine? This medicine is only given in a clinic, doctor's office, or other health care setting and will not be stored at home. NOTE: This sheet is a summary. It may not cover all possible information. If you have questions about this medicine, talk to your doctor, pharmacist, or health care provider.  2015, Elsevier/Gold Standard. (2011-11-10 12:37:47)  

## 2017-02-27 DIAGNOSIS — Z23 Encounter for immunization: Secondary | ICD-10-CM | POA: Diagnosis not present

## 2017-03-25 ENCOUNTER — Other Ambulatory Visit: Payer: Self-pay | Admitting: Oncology

## 2017-03-25 DIAGNOSIS — C7951 Secondary malignant neoplasm of bone: Secondary | ICD-10-CM

## 2017-04-13 ENCOUNTER — Telehealth: Payer: Self-pay | Admitting: Oncology

## 2017-04-13 NOTE — Telephone Encounter (Signed)
Added lab for 11/20 prior to ct. Spoke with patient she is aware. Desk nurse will check if patient needs to keep lab for 11/27.

## 2017-04-14 ENCOUNTER — Encounter (HOSPITAL_COMMUNITY): Payer: Self-pay

## 2017-04-14 ENCOUNTER — Ambulatory Visit (HOSPITAL_COMMUNITY)
Admission: RE | Admit: 2017-04-14 | Discharge: 2017-04-14 | Disposition: A | Discharge status: Home / Self Care | Payer: Medicare Other | Source: Ambulatory Visit | Attending: Oncology | Admitting: Oncology

## 2017-04-14 ENCOUNTER — Other Ambulatory Visit (HOSPITAL_BASED_OUTPATIENT_CLINIC_OR_DEPARTMENT_OTHER): Payer: Medicare Other

## 2017-04-14 DIAGNOSIS — C50912 Malignant neoplasm of unspecified site of left female breast: Secondary | ICD-10-CM

## 2017-04-14 DIAGNOSIS — Z170 Estrogen receptor positive status [ER+]: Secondary | ICD-10-CM | POA: Insufficient documentation

## 2017-04-14 DIAGNOSIS — C7951 Secondary malignant neoplasm of bone: Secondary | ICD-10-CM

## 2017-04-14 DIAGNOSIS — C50412 Malignant neoplasm of upper-outer quadrant of left female breast: Secondary | ICD-10-CM | POA: Diagnosis not present

## 2017-04-14 LAB — COMPREHENSIVE METABOLIC PANEL
ALBUMIN: 3.7 g/dL (ref 3.5–5.0)
ALT: 16 U/L (ref 0–55)
AST: 21 U/L (ref 5–34)
Alkaline Phosphatase: 40 U/L (ref 40–150)
Anion Gap: 8 mEq/L (ref 3–11)
BUN: 14.6 mg/dL (ref 7.0–26.0)
CALCIUM: 10.5 mg/dL — AB (ref 8.4–10.4)
CO2: 27 mEq/L (ref 22–29)
CREATININE: 0.7 mg/dL (ref 0.6–1.1)
Chloride: 106 mEq/L (ref 98–109)
EGFR: >60 mL/min/{1.73_m2} (ref >60)
Glucose: 87 mg/dl (ref 70–140)
Potassium: 4.8 mEq/L (ref 3.5–5.1)
Sodium: 141 mEq/L (ref 136–145)
TOTAL PROTEIN: 7.2 g/dL (ref 6.4–8.3)
Total Bilirubin: 0.36 mg/dL (ref 0.20–1.20)

## 2017-04-14 LAB — CBC WITH DIFFERENTIAL/PLATELET
BASO%: 1.1 % (ref 0.0–2.0)
BASOS ABS: 0.1 10*3/uL (ref 0.0–0.1)
EOS%: 2.6 % (ref 0.0–7.0)
Eosinophils Absolute: 0.1 10*3/uL (ref 0.0–0.5)
HEMATOCRIT: 43.8 % (ref 34.8–46.6)
HEMOGLOBIN: 14.8 g/dL (ref 11.6–15.9)
LYMPH#: 1.1 10*3/uL (ref 0.9–3.3)
LYMPH%: 23.1 % (ref 14.0–49.7)
MCH: 30.9 pg (ref 25.1–34.0)
MCHC: 33.7 g/dL (ref 31.5–36.0)
MCV: 91.6 fL (ref 79.5–101.0)
MONO#: 0.4 10*3/uL (ref 0.1–0.9)
MONO%: 8.9 % (ref 0.0–14.0)
NEUT%: 64.3 % (ref 38.4–76.8)
NEUTROS ABS: 3.2 10*3/uL (ref 1.5–6.5)
Platelets: 239 10*3/uL (ref 145–400)
RBC: 4.78 10*6/uL (ref 3.70–5.45)
RDW: 13.7 % (ref 11.2–14.5)
WBC: 4.9 10*3/uL (ref 3.9–10.3)

## 2017-04-14 MED ORDER — IOPAMIDOL (ISOVUE-300) INJECTION 61%
INTRAVENOUS | Status: AC
  Filled 2017-04-14: qty 75

## 2017-04-14 MED ORDER — IOPAMIDOL (ISOVUE-300) INJECTION 61%
75.0000 mL | Freq: Once | INTRAVENOUS | Status: AC | PRN
  Administered 2017-04-14: 75 mL via INTRAVENOUS

## 2017-04-16 NOTE — Progress Notes (Signed)
[place]  [phone]) [phone] Fax:(336) [phone]   ID: GAE BIHL DOB: 07/02/1976  MR#: [id number]  YKD#:[id number]  Patient Care Team: [name], MD as PCP - General (Internal Medicine) Magrinat, [name], MD as Consulting Physician (Oncology) PCP: [name], MD GYN: SU: [name] M.D. OTHER MD:  Rodell Perna M.D., [name] MD, Stephannie Peters M.D.  CHIEF COMPLAINT: Stage IV estrogen receptor positive breast cancer  CURRENT TREATMENT:  Letrozole; denosumab/Xgeva  INTERVAL HISTORY: April Rosales returns today for follow-up and treatment of her stage IV estrogen receptor positive breast cancer. She is accompanied by her sister today. She continues on letrozole, with good tolerance. She denies hot flashes or vaginal dryness.   She also receives denosumab/Xgeva every 8 weeks, with a dose due today. She has good tolerance of this medication.   Since her last visit to the office, she underwent CT Chest with contrast on 04/14/2017 with results showing: Postsurgical changes involving partial corpectomy at T3. Stable posterior fixation involving the lower cervical/ upper thoracic spine. No suspicious findings involving the T1 vertebral body on CT. No evidence of metastatic disease in the chest.   REVIEW OF SYSTEMS: Lindsee reports that she hasn't been exercising due to her right thigh pain localized below right hip. She does have a cane that she doesn't use.  She enjoyed her Thanksgiving and went to [employer] with family. She takes 1 capsule of gabapentin at bed time, but hasn't tried one for the past 3 days. She notes that she has issues with sleeping. She denies unusual headaches, visual changes, nausea, vomiting, or dizziness. There has been no unusual cough, phlegm production, or pleurisy. This been no change in bowel or bladder habits. She denies unexplained fatigue or unexplained weight loss, bleeding, rash, or fever. A detailed review of systems was  otherwise stable.    BREAST CANCER HISTORY: From the original intake note:   April Rosales underwent right lumpectomy in 1985 at [facility] for what sounds like a stage I breast cancer. She tells me she had more than 30 lymph nodes removed from her right axilla and all of them were clear. She received  adjuvant radiation but no systemic treatment.  The patient had recently refused mammography with the last mammogram I can find dating back to August 2010.  More recently the patient presented with left scapular pain radiating down the left arm.  She was evaluated by Dr. [name], who obtained a chest x-ray showing a possible abnormality at T3. He then set up the patient for an MRI of the thoracic spine performed 08/16/2014.  This showed multiple compression fractures  (a similar picture had been noted on lumbar MRI 10/09/2011 ). However at T3 they noted tumor in the vertebral body extending into the left pedicle and into the lateral epidural space, displacing the cord to the right. There was no evidence of cord compression or cord signal abnormality. There were no other areas of tumor identified in the thoracic spine.         The patient was then referred to Dr. Hal Neer who on 08/24/2014 set April Rosales up for CT scans of the chest, abdomen and pelvis. There was a dense mass in the upper inner quadrant of the left breast measuring 1.6 cm. There was a 1.2 cm nodule in the minor fissure of the right lung and some evidence of right lung fibrosis at the site of the prior radiation port.  There was also a thyroid mass measuring 2 cm. However there  were no parenchymal lung or liver lesions. Incidental meningoceles were noted as well as sclerosis of the fifth and sixth ribs which were felt to be likely posttraumatic.   On 08/29/2014 the patient underwent bilateral diagnostic mammography with tomosynthesis and left breast ultrasonography.  There were postsurgical changes in the upper right breast. In the left  breast there was an irregular mass measuring 2.3 cm in the upper inner quadrant. This was palpable. Ultrasound showed this to be hypoechoic and to measure 2.0 cm. There were adjacent areas of nodularity. There was no definite lymphadenopathy in the left axilla.  Biopsy of this breast mass 08/29/2014 showed an invasive adenocarcinoma with both ductal and lobular features (there was strong diffuse E-cadherin expression as well as areas with total absence of E-cadherin expression), with the preliminary prognostic profile showing strong estrogen positivity, very weak to near absent progesterone positivity, an MIB-1 of approximately 40%, and HER-2 equivocal  The patient's subsequent history is as detailed below.   PAST MEDICAL HISTORY: Past Medical History:  Diagnosis Date  . Arthritis   . Breast cancer [facility]) 1985/2016   takes [name] daily  . Chronic back pain    stenosis/listhesis  . Family history of ovarian cancer   . Osteoporosis    takes Vit D  . Radiation 11/23/14-12/11/14   30 Gy T1-T5     PAST SURGICAL HISTORY: Past Surgical History:  Procedure Laterality Date  . ABDOMINAL HYSTERECTOMY     1980  . APPENDECTOMY  1977  . APPLICATION OF INTRAOPERATIVE CT SCAN N/A 10/20/2014   Procedure: APPLICATION OF INTRAOPERATIVE CAT SCAN;  Surgeon: [name], MD;  Location: [place] [facility];  Service: Neurosurgery;  Laterality: N/A;  . BREAST SURGERY Right 1985  . THYROID CYST EXCISION  1967    FAMILY HISTORY Family History  Problem Relation Age of Onset  . Heart disease Mother   . Hypertension Mother   . Heart disease Father   . Diabetes Father   . Breast cancer Paternal Aunt        4 paternal aunts with breast cancer over 59  . Prostate cancer Paternal Uncle   . Stroke Paternal Grandfather   . Ovarian cancer Paternal Aunt   . Huntington's disease Other        Nephew, inherited from his father   The patient's father died from a heart attack at the age of 54. He had 9 sisters. 3 of  those sisters had breast cancer, all in a menopausal setting. Another sister had ovarian cancer. One of the paternal uncles had cancer of the colon "and back".  The patient's mother died at the age of 92. She was found to have breast cancer shortly before dying , during her final hospitalization.  GYNECOLOGIC HISTORY:  No LMP recorded. Patient has had a hysterectomy.  Menarche age 15, first live birth age 62, the patient is GX P1. She underwent hysterectomy in 1980. She thinks the ovaries were removed, but the CT scan obtained 08/24/2014 showed a definite right ovary. The left ovary may have been removed. She did not take hormone replacement after the hysterectomy.  SOCIAL HISTORY:   Kamica worked in Psychiatric nurse. She is divorced, lives alone, with 2 cats. Her son Bethann Berkshire lives in [place] where he works in Engineer, technical sales. He has 4 children of his own. The patient is a Psychologist, forensic.    ADVANCED DIRECTIVES:  In place. The patient has named her sister Pleas Koch [phone]) and her friend Janina Mayo [phone]) as joint healthcare  powers of attorney   HEALTH MAINTENANCE: Social History   Tobacco Use  . Smoking status: Former Smoker    Packs/day: 0.50    Years: 10.00    Pack years: 5.00    Types: Cigarettes  . Smokeless tobacco: Former Systems developer  . Tobacco comment: quit smoking 1970's  Substance Use Topics  . Alcohol use: Yes    Alcohol/week: 0.0 oz    Comment: Rare  . Drug use: No     Colonoscopy: never  PAP: status post hysterectomy  Bone density: 08/14/2014 at [facility], results pending  Lipid panel:  Allergies  Allergen Reactions  . Ativan [Lorazepam]     Made her crazy  . Dilaudid [Hydromorphone Hcl]     Doesn't want  . Haldol [Haloperidol Lactate]     Made her crazy    Current Outpatient Medications  Medication Sig Dispense Refill  . Ascorbic Acid (VITAMIN C PO) Take 1 tablet by mouth every other day. Takes qod    . aspirin EC 325 MG tablet Take 1 tablet (325  mg total) by mouth daily. 30 tablet 0  . Cholecalciferol (VITAMIN D3 ADULT GUMMIES PO) Take 4,000 Units by mouth daily.    . Cyanocobalamin (VITAMIN B-12 PO) Take by mouth daily. Takes every other day    . gabapentin (NEURONTIN) 100 MG capsule Take 2 capsules (200 mg total) by mouth at bedtime. 60 capsule 5  . gabapentin (NEURONTIN) 100 MG capsule TAKE 1-3 CAPSULES BY MOUTH ONCE DURING THE DAYTIME AS NEEDED FOR PAIN(CONTINUE TAKING 300MG SCRIPT AT BEDTIME) 90 capsule 0  . letrozole (FEMARA) 2.5 MG tablet TAKE 1 TABLET(2.5 MG) BY MOUTH DAILY 90 tablet 0  . letrozole (FEMARA) 2.5 MG tablet TAKE 1 TABLET(2.5 MG) BY MOUTH DAILY 90 tablet 0   No current facility-administered medications for this visit.     OBJECTIVE:  Older white woman in no acute distress In no acute distress Vitals:   04/21/17 1132  BP: 133/71  Pulse: 86  Resp: 17  Temp: (!) 97.3 F (36.3 C)  SpO2: 97%     Body mass index is 23.05 kg/m.    ECOG FS:1 - Symptomatic but completely ambulatory  Sclerae unicteric, pupils round and equal Oropharynx clear and moist No cervical or supraclavicular adenopathy Lungs no rales or rhonchi Heart regular rate and rhythm Abd soft, nontender, positive bowel sounds MSK kyphosis, no focal spinal tenderness, no upper extremity lymphedema Neuro: nonfocal, well oriented, appropriate affect Breasts: The right breast is undergone remote lumpectomy.  There is no evidence of local recurrence.  I do not palpate any masses in the left breast and there are no skin or nipple changes of concern.  Both axillae are benign.  LAB RESULTS:  CMP     Component Value Date/Time   NA 141 04/14/2017 1043   K 4.8 04/14/2017 1043   CL 105 07/07/2016 1125   CO2 27 04/14/2017 1043   GLUCOSE 87 04/14/2017 1043   BUN 14.6 04/14/2017 1043   CREATININE 0.7 04/14/2017 1043   CALCIUM 10.5 (H) 04/14/2017 1043   PROT 7.2 04/14/2017 1043   ALBUMIN 3.7 04/14/2017 1043   AST 21 04/14/2017 1043   ALT 16  04/14/2017 1043   ALKPHOS 40 04/14/2017 1043   BILITOT 0.36 04/14/2017 1043   GFRNONAA 85 07/07/2016 1125   GFRAA >89 07/07/2016 1125    INo results found for: SPEP, UPEP  Lab Results  Component Value Date   WBC 4.9 04/14/2017   NEUTROABS 3.2 04/14/2017  HGB 14.8 04/14/2017   HCT 43.8 04/14/2017   MCV 91.6 04/14/2017   PLT 239 04/14/2017      Chemistry      Component Value Date/Time   NA 141 04/14/2017 1043   K 4.8 04/14/2017 1043   CL 105 07/07/2016 1125   CO2 27 04/14/2017 1043   BUN 14.6 04/14/2017 1043   CREATININE 0.7 04/14/2017 1043      Component Value Date/Time   CALCIUM 10.5 (H) 04/14/2017 1043   ALKPHOS 40 04/14/2017 1043   AST 21 04/14/2017 1043   ALT 16 04/14/2017 1043   BILITOT 0.36 04/14/2017 1043       Lab Results  Component Value Date   LABCA2 24 01/01/2016    No components found for: [id number]  No results for input(s): INR in the last 168 hours.  Urinalysis    Component Value Date/Time   COLORURINE YELLOW 09/19/2015 1239   APPEARANCEUR CLOUDY (A) 09/19/2015 1239   LABSPEC 1.015 09/19/2015 1239   PHURINE 7.5 09/19/2015 1239   GLUCOSEU NEGATIVE 09/19/2015 1239   HGBUR NEGATIVE 09/19/2015 1239   BILIRUBINUR NEGATIVE 09/19/2015 1239   KETONESUR NEGATIVE 09/19/2015 1239   PROTEINUR NEGATIVE 09/19/2015 1239   NITRITE NEGATIVE 09/19/2015 1239   LEUKOCYTESUR NEGATIVE 09/19/2015 1239    STUDIES: Ct Chest W Contrast  Result Date: 04/14/2017 CLINICAL DATA:  Left breast cancer, diagnosed 2016, with metastatic disease at T3 with epidural tumor extension status post XRT. Remote history of right breast cancer. EXAM: CT CHEST WITH CONTRAST TECHNIQUE: Multidetector CT imaging of the chest was performed during intravenous contrast administration. CONTRAST:  90m ISOVUE-300 IOPAMIDOL (ISOVUE-300) INJECTION 61% COMPARISON:  Whole-body bone scan dated 10/01/2016. MRI thoracic spine dated 04/02/2016. CT chest dated 01/29/2016. FINDINGS: Cardiovascular:  Heart is top-normal in size. No pericardial effusion. No evidence of thoracic aortic aneurysm. Mild coronary atherosclerosis of the LAD. Mediastinum/Nodes: No suspicious mediastinal, hilar, or axillary lymphadenopathy. Status post right axillary lymph node dissection. Visualize thyroid is grossly unremarkable. Lungs/Pleura: Radiation changes in the anterior left upper and middle lobes. Postprocedural changes at the left lung apex. 4 mm left lower lobe pulmonary nodule (series 5/ image 106), unchanged, benign. Mild subpleural reticulation in the bilateral lower lobes. No pleural effusion or pneumothorax. Upper Abdomen: Visualized upper abdomen is unremarkable. Musculoskeletal: Stable fluid density lesion along the left aspect of T2 (series 2/ image 13), better evaluated on prior MRI, likely reflecting a benign a lateral meningocele. Postsurgical changes involving partial corpectomy at T3 (series 2/ image 21). No suspicious findings involving the T1 vertebral body on CT. Stable posterior fixation involving the lower cervical/ upper thoracic spine. IMPRESSION: Postsurgical changes involving partial corpectomy at T3. Stable posterior fixation involving the lower cervical/ upper thoracic spine. No suspicious findings involving the T1 vertebral body on CT. No evidence of metastatic disease in the chest. Electronically Signed   By: SJulian HyM.D.   On: 04/14/2017 16:38    ASSESSMENT: 76y.o. [place] woman with stage IV left breast cancer involving bone  (1) status post right lumpectomy and axillary node dissection in 1985 [facility]: measurable disease in spine, lung and left breast   (2) evaluation for left shoulder pain led to thoracic spine MRI 08/16/2014 showing a pathologic fracture at T3 with epidural tumor displacing the cord to the right, but no cord compression. CT scans of the chest, abdomen and pelvis 08/24/2014 showed in addition a mass in the  upper outer quadrant left breast  measuring 1.6 cm and a nodule in the minor fissure of the right lung measuring 1.2 cm, but no parenchymal lung or liver lesions.   (a) CA 27-29 was noninformative at 38 (09/20/2014)    (3) mammography and ultrasonography 08/29/2014 show a mass in the upper inner left breast which was palpable,  measuring 2.0 cm by ultrasound. Biopsy of this mass 08/29/2014 showed an invasive breast cancer with both lobular and ductal features, estrogen receptor positive, progesterone receptor weakly positive, with an MIB-1 in the 40% range, HER-2 equivocal (6 else ratio 1.5, but average number her nucleus 5.8)    (4) letrozole started 08/31/2014;   (a) palbociclib added Sept 2016 at 75 mg 21/7, with significant neutropenia; not repeated after first cycle   (5)  zolendronate started 09/20/2014, stopped after initial dose due to poor tolerance  (a) denosumab/ Xgeva started 01/31/2015, repeated every 4 weeks, changed to every 8 weeks as of August 2018.    (6) on 10/20/2014 the patient underwent T2-T3 and T4 decompressive laminectomy with removal of epidural tumor, C7-T4 segmental pedicle screw instrumentation with virage screw system with arrow guidance protocol and C7-T4 posterolateral fusion. The cells were positive for the estrogen receptor. HER-2/neu testing by [facility] showed again equivocal results,  (a) most recent bone scan 10/01/2016 finds no new lesions only postoperative changes  (7) radiation 11/23/2014-12/11/2014.  (a) T1-T5 was treated to 30 Gy in 12 fractions at 2.5 Gy per fraction   (8)  consider eventual left lumpectomy or mastectomy depending on  longer-term results of systemic therapy  (a) most recent left breast ultrasonography 10/30/2016 found this mass to measure 0.7 cm  (9) genetics testing using the Breast/Ovarian Cancer Panel through [facility] [name], MD) found no deleterious mutations in ATM, BARD1, BRCA1, BRCA2, BRIP1, CDH1, CHEK2, EPCAM, FANCC, MLH1, MSH2,  MSH6, NBN, PALB2, PMS2, PTEN, RAD51C, RAD51D, STK11, TP53, or XRCC2    (10) right thyroid nodule is a complex cyst as noted on CT scan of the neck 01/29/2016  (11) osteoporosis: [facility] density at [facility] 09/12/2016 showed a T score of -4.8.  (a) continue Xgeva as above  PLAN: I spent approximately 30 minutes with April Rosales with most of that time spent discussing her complex situation.  She is tolerating the letrozole and the denosumab/Xgeva generally quite well.  The CT of the chest is very encouraging, and basically shows no active disease.  We do need to check her left breast to see how the left breast mass is doing.  She is set up for an ultrasound within the week.  I expect good news there as well.  I do not have a simple explanation as to why her calcium is mildly elevated.  She is taking 4000 units of vitamin D daily.  Were going to cut that down to 2000 units.  I am going to add a PTH and PTH related protein to her next set of labs, 6 weeks from now.  The plan then is to continue Xgeva every 8 weeks, and letrozole daily.  She will be due for repeat bone density April 2020.  I will repeat a left breast ultrasound before she returns to see me, 16 weeks from now  At this point I am delighted at how well she is doing.  She knows to call for any other issues that may develop before the next visit.  Magrinat, [name], MD  04/21/17 11:40 AM Medical Oncology and Hematology [facility] [place],  [id number] Tel.  [phone]    Fax. [phone]    This document serves as a record of services personally performed by Lurline Del, MD. It was created on his behalf by Steva Colder, a trained medical scribe. The creation of this record is based on the scribe's personal observations and the provider's statements to them.   I have reviewed the above documentation for accuracy and completeness, and I agree with the above.

## 2017-04-21 ENCOUNTER — Ambulatory Visit (HOSPITAL_BASED_OUTPATIENT_CLINIC_OR_DEPARTMENT_OTHER): Payer: Medicare Other

## 2017-04-21 ENCOUNTER — Telehealth: Payer: Self-pay

## 2017-04-21 ENCOUNTER — Telehealth: Payer: Self-pay | Admitting: Oncology

## 2017-04-21 ENCOUNTER — Ambulatory Visit (HOSPITAL_BASED_OUTPATIENT_CLINIC_OR_DEPARTMENT_OTHER): Payer: Medicare Other | Admitting: Oncology

## 2017-04-21 ENCOUNTER — Other Ambulatory Visit: Payer: Medicare Other

## 2017-04-21 VITALS — BP 133/71 | HR 86 | Temp 97.3°F | Resp 17 | Ht 61.5 in | Wt 124.0 lb

## 2017-04-21 DIAGNOSIS — M810 Age-related osteoporosis without current pathological fracture: Secondary | ICD-10-CM

## 2017-04-21 DIAGNOSIS — C50919 Malignant neoplasm of unspecified site of unspecified female breast: Secondary | ICD-10-CM

## 2017-04-21 DIAGNOSIS — C50912 Malignant neoplasm of unspecified site of left female breast: Secondary | ICD-10-CM

## 2017-04-21 DIAGNOSIS — C7951 Secondary malignant neoplasm of bone: Secondary | ICD-10-CM | POA: Diagnosis not present

## 2017-04-21 DIAGNOSIS — C50412 Malignant neoplasm of upper-outer quadrant of left female breast: Secondary | ICD-10-CM | POA: Diagnosis not present

## 2017-04-21 DIAGNOSIS — E041 Nontoxic single thyroid nodule: Secondary | ICD-10-CM | POA: Diagnosis not present

## 2017-04-21 DIAGNOSIS — Z170 Estrogen receptor positive status [ER+]: Secondary | ICD-10-CM | POA: Diagnosis not present

## 2017-04-21 MED ORDER — GABAPENTIN 100 MG PO CAPS: 100.0000 mg | ORAL_CAPSULE | Freq: Every day | ORAL | 4 refills | Status: DC

## 2017-04-21 MED ORDER — DENOSUMAB 120 MG/1.7ML ~~LOC~~ SOLN
120.0000 mg | Freq: Once | SUBCUTANEOUS | Status: AC
  Administered 2017-04-21: 120 mg via SUBCUTANEOUS
  Filled 2017-04-21: qty 1.7

## 2017-04-21 NOTE — Telephone Encounter (Signed)
Gave patient AVS and calendar of upcoming January through march appointments

## 2017-04-21 NOTE — Telephone Encounter (Signed)
Ok for injection today with labs from 04/14/17 per Dr Jana Hakim

## 2017-04-21 NOTE — Patient Instructions (Signed)
Denosumab injection What is this medicine? DENOSUMAB (den oh sue mab) slows bone breakdown. Prolia is used to treat osteoporosis in women after menopause and in men. Xgeva is used to prevent bone fractures and other bone problems caused by cancer bone metastases. Xgeva is also used to treat giant cell tumor of the bone. This medicine may be used for other purposes; ask your health care provider or pharmacist if you have questions. COMMON BRAND NAME(S): Prolia, XGEVA What should I tell my health care provider before I take this medicine? They need to know if you have any of these conditions: -dental disease -eczema -infection or history of infections -kidney disease or on dialysis -low blood calcium or vitamin D -malabsorption syndrome -scheduled to have surgery or tooth extraction -taking medicine that contains denosumab -thyroid or parathyroid disease -an unusual reaction to denosumab, other medicines, foods, dyes, or preservatives -pregnant or trying to get pregnant -breast-feeding How should I use this medicine? This medicine is for injection under the skin. It is given by a health care professional in a hospital or clinic setting. If you are getting Prolia, a special MedGuide will be given to you by the pharmacist with each prescription and refill. Be sure to read this information carefully each time. For Prolia, talk to your pediatrician regarding the use of this medicine in children. Special care may be needed. For Xgeva, talk to your pediatrician regarding the use of this medicine in children. While this drug may be prescribed for children as young as 13 years for selected conditions, precautions do apply. Overdosage: If you think you've taken too much of this medicine contact a poison control center or emergency room at once. Overdosage: If you think you have taken too much of this medicine contact a poison control center or emergency room at once. NOTE: This medicine is only for  you. Do not share this medicine with others. What if I miss a dose? It is important not to miss your dose. Call your doctor or health care professional if you are unable to keep an appointment. What may interact with this medicine? Do not take this medicine with any of the following medications: -other medicines containing denosumab This medicine may also interact with the following medications: -medicines that suppress the immune system -medicines that treat cancer -steroid medicines like prednisone or cortisone This list may not describe all possible interactions. Give your health care provider a list of all the medicines, herbs, non-prescription drugs, or dietary supplements you use. Also tell them if you smoke, drink alcohol, or use illegal drugs. Some items may interact with your medicine. What should I watch for while using this medicine? Visit your doctor or health care professional for regular checks on your progress. Your doctor or health care professional may order blood tests and other tests to see how you are doing. Call your doctor or health care professional if you get a cold or other infection while receiving this medicine. Do not treat yourself. This medicine may decrease your body's ability to fight infection. You should make sure you get enough calcium and vitamin D while you are taking this medicine, unless your doctor tells you not to. Discuss the foods you eat and the vitamins you take with your health care professional. See your dentist regularly. Brush and floss your teeth as directed. Before you have any dental work done, tell your dentist you are receiving this medicine. Do not become pregnant while taking this medicine or for 5 months after stopping   it. Women should inform their doctor if they wish to become pregnant or think they might be pregnant. There is a potential for serious side effects to an unborn child. Talk to your health care professional or pharmacist for more  information. What side effects may I notice from receiving this medicine? Side effects that you should report to your doctor or health care professional as soon as possible: -allergic reactions like skin rash, itching or hives, swelling of the face, lips, or tongue -breathing problems -chest pain -fast, irregular heartbeat -feeling faint or lightheaded, falls -fever, chills, or any other sign of infection -muscle spasms, tightening, or twitches -numbness or tingling -skin blisters or bumps, or is dry, peels, or red -slow healing or unexplained pain in the mouth or jaw -unusual bleeding or bruising Side effects that usually do not require medical attention (Report these to your doctor or health care professional if they continue or are bothersome.): -muscle pain -stomach upset, gas This list may not describe all possible side effects. Call your doctor for medical advice about side effects. You may report side effects to FDA at 1-800-FDA-1088. Where should I keep my medicine? This medicine is only given in a clinic, doctor's office, or other health care setting and will not be stored at home. NOTE: This sheet is a summary. It may not cover all possible information. If you have questions about this medicine, talk to your doctor, pharmacist, or health care provider.  2015, Elsevier/Gold Standard. (2011-11-10 12:37:47)  

## 2017-05-13 ENCOUNTER — Ambulatory Visit
Admission: RE | Admit: 2017-05-13 | Discharge: 2017-05-13 | Disposition: A | Discharge status: Home / Self Care | Payer: Medicare Other | Source: Ambulatory Visit | Attending: Oncology | Admitting: Oncology

## 2017-05-13 DIAGNOSIS — Z170 Estrogen receptor positive status [ER+]: Secondary | ICD-10-CM

## 2017-05-13 DIAGNOSIS — N6489 Other specified disorders of breast: Secondary | ICD-10-CM | POA: Diagnosis not present

## 2017-05-13 DIAGNOSIS — C50412 Malignant neoplasm of upper-outer quadrant of left female breast: Secondary | ICD-10-CM

## 2017-05-13 DIAGNOSIS — C50912 Malignant neoplasm of unspecified site of left female breast: Secondary | ICD-10-CM

## 2017-05-13 DIAGNOSIS — C7951 Secondary malignant neoplasm of bone: Secondary | ICD-10-CM

## 2017-05-14 ENCOUNTER — Other Ambulatory Visit: Payer: Self-pay | Admitting: Oncology

## 2017-05-14 MED ORDER — PALBOCICLIB 75 MG PO CAPS: ORAL_CAPSULE | ORAL | 6 refills | Status: DC

## 2017-05-14 NOTE — Progress Notes (Signed)
I called April Rosales and let her know the results of her mammography which shows the measurable disease there has increased from 0.7-1.4 cm.  We are going to stop her current treatment and she will start fulvestrant and palbociclib when she returns to see me May 29, 2017.

## 2017-05-15 ENCOUNTER — Telehealth: Payer: Self-pay | Admitting: Pharmacy Technician

## 2017-05-15 NOTE — Telephone Encounter (Signed)
Oral Oncology Patient Advocate Encounter  Received notification from [employer] that prior authorization for Leslee Home is required.  PA submitted on CoverMyMeds Key [id number] Status is pending  Oral Oncology Clinic will continue to follow.  Fabio Asa. Melynda Keller, [place] Patient [place] [phone] 05/15/2017 10:06 AM

## 2017-05-16 ENCOUNTER — Other Ambulatory Visit: Payer: Self-pay | Admitting: Oncology

## 2017-05-18 NOTE — Telephone Encounter (Signed)
Oral Oncology Patient Advocate Encounter  Prior Authorization for April Rosales has been approved.    Effective dates: 05/15/2017 through 11/13/2017  Oral Oncology Clinic will continue to follow.   Fabio Asa. Melynda Keller, [place] Patient [place] [phone] 05/18/2017 8:25 AM

## 2017-05-21 ENCOUNTER — Telehealth: Payer: Self-pay | Admitting: Pharmacist

## 2017-05-21 DIAGNOSIS — C50912 Malignant neoplasm of unspecified site of left female breast: Secondary | ICD-10-CM

## 2017-05-21 MED ORDER — PALBOCICLIB 75 MG PO CAPS: ORAL_CAPSULE | ORAL | 0 refills | Status: DC

## 2017-05-21 NOTE — Telephone Encounter (Signed)
Oral Oncology Pharmacist Encounter  Received new prescription for Ibrance (palbociclib) for the treatment of hormone receptor positive, metastatic breast cancer in conjunction with Faslodex, planned duration until disease progression or unacceptable toxicity.  Noted patient previously on treatment with Ibrance in September 2016, initiated at 75mg  daily, 3 weeks on, 1 week off. Patient experienced neutropenia necessitating medication discontinuation. Patient was continued on Femara and Delton See for her disease until Dec 2018 with progression of mass on mammography.  Labs from 04/14/17 assessed, OK for treatment.  Current medication list in Epic reviewed, no DDIs with Ibrance or Faslodex identified.  Prescription has been e-scribed to the [facility] for benefits analysis and approval. Prior authorization was submitted and approved. Copayment high at $2772.65, this will be discussed with patient. Will use manufacturer voucher for 1st fill. Oral Oncology Patient Advocate will follow-up with patient for additional means of copayment support.  Oral Oncology Clinic will continue to follow for copayment issues, initial counseling and start date.  Johny Drilling, PharmD, BCPS, BCOP 05/21/2017 12:21 PM Oral Oncology Clinic [phone]

## 2017-05-21 NOTE — Telephone Encounter (Signed)
Oral Chemotherapy Pharmacist Encounter   Attempted to reach patient to provide update and offer for initial counseling on oral medication: Ibrance.  No answer. Phone just rang and rang with no answering machine.  We will continue to try to reach patient.  Thank you,  Johny Drilling, PharmD, BCPS, BCOP 05/21/2017   2:04 PM Oral Oncology Clinic [phone]

## 2017-05-22 ENCOUNTER — Other Ambulatory Visit: Payer: Self-pay | Admitting: *Deleted

## 2017-05-22 ENCOUNTER — Telehealth: Payer: Self-pay | Admitting: Pharmacy Technician

## 2017-05-22 ENCOUNTER — Other Ambulatory Visit: Payer: Self-pay | Admitting: Oncology

## 2017-05-22 NOTE — Telephone Encounter (Signed)
Oral Oncology Patient Advocate Encounter  Was successful in securing patient a $15,000 grant from [employer] to provide copayment coverage for [place]. This will keep the out of pocket expense at $0.   I have spoken with the patient..   The billing information is as follows and has been shared with [place].   Member ID: [id number] Group ID: [id number] RxBin: [id number] Dates of Eligibility: 04/22/2017 through 04/21/2018  Fabio Asa. Melynda Keller, [place] Patient [place] [phone] 05/22/2017 2:14 PM

## 2017-05-22 NOTE — Progress Notes (Unsigned)
[place]  [phone] [phone]   ID: April Rosales DOB: December 01, 1940  MR#: [id number]  PIR#:[id number]  Patient Care Team: [name], MD as PCP - General (Internal Medicine) Airon Sahni, [name], MD as Consulting Physician (Oncology) PCP: [name], MD GYN: SU: [name] M.D. OTHER MD:  Rodell Perna M.D., [name] MD, Stephannie Peters M.D.  CHIEF COMPLAINT: Stage IV estrogen receptor positive breast cancer  CURRENT TREATMENT:  Letrozole; denosumab/Xgeva  INTERVAL HISTORY: April Rosales returns today for follow-up and treatment of her stage IV estrogen receptor positive breast cancer. She is accompanied by her sister today. She continues on letrozole, with good tolerance. She denies hot flashes or vaginal dryness.   She also receives denosumab/Xgeva every 8 weeks, with a dose due today. She has good tolerance of this medication.   Since her last visit to the office, she underwent CT Chest with contrast on 04/14/2017 with results showing: Postsurgical changes involving partial corpectomy at T3. Stable posterior fixation involving the lower cervical/ upper thoracic spine. No suspicious findings involving the T1 vertebral body on CT. No evidence of metastatic disease in the chest.   REVIEW OF SYSTEMS: April Rosales reports that she hasn't been exercising due to her right thigh pain localized below right hip. She does have a cane that she doesn't use.  She enjoyed her Thanksgiving and went to [employer] with family. She takes 1 capsule of gabapentin at bed time, but hasn't tried one for the past 3 days. She notes that she has issues with sleeping. She denies unusual headaches, visual changes, nausea, vomiting, or dizziness. There has been no unusual cough, phlegm production, or pleurisy. This been no change in bowel or bladder habits. She denies unexplained fatigue or unexplained weight loss, bleeding, rash, or fever. A detailed review of systems was  otherwise stable.    BREAST CANCER HISTORY: From the original intake note:   April Rosales underwent right lumpectomy in 1985 at [facility] for what sounds like a stage I breast cancer. She tells me she had more than 30 lymph nodes removed from her right axilla and all of them were clear. She received  adjuvant radiation but no systemic treatment.  The patient had recently refused mammography with the last mammogram I can find dating back to August 2010.  More recently the patient presented with left scapular pain radiating down the left arm.  She was evaluated by Dr. Lorin Rosales, who obtained a chest x-ray showing a possible abnormality at T3. He then set up the patient for an MRI of the thoracic spine performed 08/16/2014.  This showed multiple compression fractures  (a similar picture had been noted on lumbar MRI 10/09/2011 ). However at T3 they noted tumor in the vertebral body extending into the left pedicle and into the lateral epidural space, displacing the cord to the right. There was no evidence of cord compression or cord signal abnormality. There were no other areas of tumor identified in the thoracic spine.         The patient was then referred to Dr. Hal Rosales who on 08/24/2014 set April Rosales up for CT scans of the chest, abdomen and pelvis. There was a dense mass in the upper inner quadrant of the left breast measuring 1.6 cm. There was a 1.2 cm nodule in the minor fissure of the right lung and some evidence of right lung fibrosis at the site of the prior radiation port.  There was also a thyroid mass measuring 2 cm. However there  were no parenchymal lung or liver lesions. Incidental meningoceles were noted as well as sclerosis of the fifth and sixth ribs which were felt to be likely posttraumatic.   On 08/29/2014 the patient underwent bilateral diagnostic mammography with tomosynthesis and left breast ultrasonography.  There were postsurgical changes in the upper right breast. In the left  breast there was an irregular mass measuring 2.3 cm in the upper inner quadrant. This was palpable. Ultrasound showed this to be hypoechoic and to measure 2.0 cm. There were adjacent areas of nodularity. There was no definite lymphadenopathy in the left axilla.  Biopsy of this breast mass 08/29/2014 showed an invasive adenocarcinoma with both ductal and lobular features (there was strong diffuse E-cadherin expression as well as areas with total absence of E-cadherin expression), with the preliminary prognostic profile showing strong estrogen positivity, very weak to near absent progesterone positivity, an MIB-1 of approximately 40%, and HER-2 equivocal  The patient's subsequent history is as detailed below.   PAST MEDICAL HISTORY: Past Medical History:  Diagnosis Date  . Arthritis   . Breast cancer [facility]) 1985/2016   takes [name] daily  . Chronic back pain    stenosis/listhesis  . Family history of ovarian cancer   . Osteoporosis    takes Vit D  . Radiation 11/23/14-12/11/14   30 Gy T1-T5     PAST SURGICAL HISTORY: Past Surgical History:  Procedure Laterality Date  . ABDOMINAL HYSTERECTOMY     1980  . APPENDECTOMY  1977  . APPLICATION OF INTRAOPERATIVE CT SCAN N/A 10/20/2014   Procedure: APPLICATION OF INTRAOPERATIVE CAT SCAN;  Surgeon: [name], MD;  Location: [place] [facility];  Service: Neurosurgery;  Laterality: N/A;  . BREAST SURGERY Right 1985  . THYROID CYST EXCISION  1967    FAMILY HISTORY Family History  Problem Relation Age of Onset  . Heart disease Mother   . Hypertension Mother   . Heart disease Father   . Diabetes Father   . Breast cancer Paternal Aunt        4 paternal aunts with breast cancer over 68  . Prostate cancer Paternal Uncle   . Stroke Paternal Grandfather   . Ovarian cancer Paternal Aunt   . Huntington's disease Other        Nephew, inherited from his father   The patient's father died from a heart attack at the age of 46. He had 9 sisters. 3 of  those sisters had breast cancer, all in a menopausal setting. Another sister had ovarian cancer. One of the paternal uncles had cancer of the colon "and back".  The patient's mother died at the age of 84. She was found to have breast cancer shortly before dying , during her final hospitalization.  GYNECOLOGIC HISTORY:  No LMP recorded. Patient has had a hysterectomy.  Menarche age 48, first live birth age 51, the patient is GX P1. She underwent hysterectomy in 1980. She thinks the ovaries were removed, but the CT scan obtained 08/24/2014 showed a definite right ovary. The left ovary may have been removed. She did not take hormone replacement after the hysterectomy.  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 She is divorced, lives alone, with 2 cats. Her son Bethann Berkshire lives in [place] where he works in Engineer, technical sales. He has 4 children of his own. The patient is a Psychologist, forensic.    ADVANCED DIRECTIVES:  In place. The patient has named her sister Pleas Koch [phone] and her friend Janina Mayo [phone]) as joint healthcare  powers of attorney   HEALTH MAINTENANCE: Social History   Tobacco Use  . Smoking status: Former Smoker    Packs/day: 0.50    Years: 10.00    Pack years: 5.00    Types: Cigarettes  . Smokeless tobacco: Former Systems developer  . Tobacco comment: quit smoking 1970's  Substance Use Topics  . Alcohol use: Yes    Alcohol/week: 0.0 oz    Comment: Rare  . Drug use: No     Colonoscopy: never  PAP: status post hysterectomy  Bone density: 08/14/2014 at [facility], results pending  Lipid panel:  Allergies  Allergen Reactions  . Ativan [Lorazepam]     Made her crazy  . Dilaudid [Hydromorphone Hcl]     Doesn't want  . Haldol [Haloperidol Lactate]     Made her crazy    Current Outpatient Medications  Medication Sig Dispense Refill  . Ascorbic Acid (VITAMIN C PO) Take 1 tablet by mouth every other day. Takes qod    . aspirin EC 325 MG tablet Take 1 tablet (325  mg total) by mouth daily. 30 tablet 0  . Cholecalciferol (VITAMIN D3 ADULT GUMMIES PO) Take 4,000 Units by mouth daily.    . Cyanocobalamin (VITAMIN B-12 PO) Take by mouth daily. Takes every other day    . gabapentin (NEURONTIN) 100 MG capsule Take 2 capsules (200 mg total) by mouth at bedtime. 60 capsule 5  . gabapentin (NEURONTIN) 100 MG capsule Take 1 capsule (100 mg total) by mouth at bedtime. 90 capsule 4  . letrozole (FEMARA) 2.5 MG tablet TAKE 1 TABLET(2.5 MG) BY MOUTH DAILY 90 tablet 0  . palbociclib (IBRANCE) 75 MG capsule Take 1 tablet with food every other day, 3 weeks on, one week off 11 capsule 6   No current facility-administered medications for this visit.     OBJECTIVE:  Older white woman in no acute distress In no acute distress There were no vitals filed for this visit.   There is no height or weight on file to calculate BMI.    ECOG FS:1 - Symptomatic but completely ambulatory  Sclerae unicteric, pupils round and equal Oropharynx clear and moist No cervical or supraclavicular adenopathy Lungs no rales or rhonchi Heart regular rate and rhythm Abd soft, nontender, positive bowel sounds MSK kyphosis, no focal spinal tenderness, no upper extremity lymphedema Neuro: nonfocal, well oriented, appropriate affect Breasts: The right breast is undergone remote lumpectomy.  There is no evidence of local recurrence.  I do not palpate any masses in the left breast and there are no skin or nipple changes of concern.  Both axillae are benign.  LAB RESULTS:  CMP     Component Value Date/Time   NA 141 04/14/2017 1043   K 4.8 04/14/2017 1043   CL 105 07/07/2016 1125   CO2 27 04/14/2017 1043   GLUCOSE 87 04/14/2017 1043   BUN 14.6 04/14/2017 1043   CREATININE 0.7 04/14/2017 1043   CALCIUM 10.5 (H) 04/14/2017 1043   PROT 7.2 04/14/2017 1043   ALBUMIN 3.7 04/14/2017 1043   AST 21 04/14/2017 1043   ALT 16 04/14/2017 1043   ALKPHOS 40 04/14/2017 1043   BILITOT 0.36 04/14/2017  1043   GFRNONAA 85 07/07/2016 1125   GFRAA >89 07/07/2016 1125    INo results found for: SPEP, UPEP  Lab Results  Component Value Date   WBC 4.9 04/14/2017   NEUTROABS 3.2 04/14/2017   HGB 14.8 04/14/2017   HCT 43.8 04/14/2017   MCV 91.6 04/14/2017  PLT 239 04/14/2017      Chemistry      Component Value Date/Time   NA 141 04/14/2017 1043   K 4.8 04/14/2017 1043   CL 105 07/07/2016 1125   CO2 27 04/14/2017 1043   BUN 14.6 04/14/2017 1043   CREATININE 0.7 04/14/2017 1043      Component Value Date/Time   CALCIUM 10.5 (H) 04/14/2017 1043   ALKPHOS 40 04/14/2017 1043   AST 21 04/14/2017 1043   ALT 16 04/14/2017 1043   BILITOT 0.36 04/14/2017 1043       Lab Results  Component Value Date   LABCA2 24 01/01/2016    No components found for: [id number]  No results for input(s): INR in the last 168 hours.  Urinalysis    Component Value Date/Time   COLORURINE YELLOW 09/19/2015 1239   APPEARANCEUR CLOUDY (A) 09/19/2015 1239   LABSPEC 1.015 09/19/2015 1239   PHURINE 7.5 09/19/2015 1239   GLUCOSEU NEGATIVE 09/19/2015 1239   HGBUR NEGATIVE 09/19/2015 1239   BILIRUBINUR NEGATIVE 09/19/2015 1239   KETONESUR NEGATIVE 09/19/2015 1239   PROTEINUR NEGATIVE 09/19/2015 1239   NITRITE NEGATIVE 09/19/2015 1239   LEUKOCYTESUR NEGATIVE 09/19/2015 1239    STUDIES: US Breast Ltd Uni Left Inc Axilla  Result Date: 05/13/2017 CLINICAL DATA:  76 year old female with known, biopsy proven invasive mammary carcinoma with both ductal and lobular features involving the upper inner quadrant of the left breast and metastases to the upper thoracic spine. Patient is currently undergoing hormonal therapy with letrozole. She has not had surgery for this area. Additionally, patient has history of remote lumpectomy of the right breast with neoadjuvant radiation therapy in 1985. EXAM: ULTRASOUND OF THE LEFT BREAST COMPARISON:  Previous exam(s). FINDINGS: On physical exam, I palpate an area of focal  thickening involving the upper-outer quadrant of the left breast. Targeted ultrasound is performed, showing an irregular hypoechoic mass at the 10 o'clock position 5 cm from the nipple. It currently measures 1.4 x 0.8 x 0.4 cm. This is enlarged from prior examination at which time it measured 0.7 x 0.7 x 0.3 cm. Note is made of internal vascularity. IMPRESSION: Interval enlargement of the patient's left breast mass, consistent with biopsy proven invasive mammary carcinoma. It currently measures 1.4 x 0.8 x 0.4 cm (previously 0.7 x 0.7 x 0.3 cm). RECOMMENDATION: 1. Recommendation is for continued clinical treatment plan. 2. Patient is due for yearly bilateral mammogram in June of 2019. Additional evaluation with ultrasound can be performed at that time for direct comparison. I have discussed the findings and recommendations with the patient. Results were also provided in writing at the conclusion of the visit. If applicable, a reminder letter will be sent to the patient regarding the next appointment. BI-RADS CATEGORY  6: Known biopsy-proven malignancy. Electronically Signed   By: Kristopher Oppenheim M.D.   On: 05/13/2017 11:30    ASSESSMENT: 76 y.o. [place] woman with stage IV left breast cancer involving bone  (1) status post right lumpectomy and axillary node dissection in 1985 followed by radiation at [facility] 08/16/2014: measurable disease in spine, lung and left breast   (2) evaluation for left shoulder pain led to thoracic spine MRI 08/16/2014 showing a pathologic fracture at T3 with epidural tumor displacing the cord to the right, but no cord compression. CT scans of the chest, abdomen and pelvis 08/24/2014 showed in addition a mass in the upper outer quadrant left breast measuring 1.6 cm and a nodule in the minor fissure of  the right lung measuring 1.2 cm, but no parenchymal lung or liver lesions.   (a) CA 27-29 was noninformative at 38 (09/20/2014)    (3) mammography and  ultrasonography 08/29/2014 show a mass in the upper inner left breast which was palpable,  measuring 2.0 cm by ultrasound. Biopsy of this mass 08/29/2014 showed an invasive breast cancer with both lobular and ductal features, estrogen receptor positive, progesterone receptor weakly positive, with an MIB-1 in the 40% range, HER-2 equivocal (6 else ratio 1.5, but average number her nucleus 5.8)    (4) letrozole started 08/31/2014;   (a) palbociclib added Sept 2016 at 75 mg 21/7, with significant neutropenia; not repeated after first cycle   (5)  zolendronate started 09/20/2014, stopped after initial dose due to poor tolerance  (a) denosumab/ Xgeva started 01/31/2015, repeated every 4 weeks, changed to every 8 weeks as of August 2018.    (6) on 10/20/2014 the patient underwent T2-T3 and T4 decompressive laminectomy with removal of epidural tumor, C7-T4 segmental pedicle screw instrumentation with virage screw system with arrow guidance protocol and C7-T4 posterolateral fusion. The cells were positive for the estrogen receptor. HER-2/neu testing by [facility] showed again equivocal results,  (a) most recent bone scan 10/01/2016 finds no new lesions only postoperative changes  (7) radiation 11/23/2014-12/11/2014.  (a) T1-T5 was treated to 30 Gy in 12 fractions at 2.5 Gy per fraction   (8)  consider eventual left lumpectomy or mastectomy depending on  longer-term results of systemic therapy  (a) most recent left breast ultrasonography 10/30/2016 found this mass to measure 0.7 cm  (9) genetics testing using the Breast/Ovarian Cancer Panel through [name], MD) found no deleterious mutations in ATM, BARD1, BRCA1, BRCA2, BRIP1, CDH1, CHEK2, EPCAM, FANCC, MLH1, MSH2, MSH6, NBN, PALB2, PMS2, PTEN, RAD51C, RAD51D, STK11, TP53, or XRCC2    (10) right thyroid nodule is a complex cyst as noted on CT scan of the neck 01/29/2016  (11) osteoporosis: [facility] density [facility] 09/12/2016 showed a T score of  -4.8.  (a) continue Xgeva as above  PLAN: I spent approximately 30 minutes with April Rosales with most of that time spent discussing her complex situation.  She is tolerating the letrozole and the denosumab/Xgeva generally quite well.  The CT of the chest is very encouraging, and basically shows no active disease.  We do need to check her left breast to see how the left breast mass is doing.  She is set up for an ultrasound within the week.  I expect good news there as well.  I do not have a simple explanation as to why her calcium is mildly elevated.  She is taking 4000 units of vitamin D daily.  Were going to cut that down to 2000 units.  I am going to add a PTH and PTH related protein to her next set of labs, 6 weeks from now.  The plan then is to continue Xgeva every 8 weeks, and letrozole daily.  She will be due for repeat bone density April 2020.  I will repeat a left breast ultrasound before she returns to see me, 16 weeks from now  At this point I am delighted at how well she is doing.  She knows to call for any other issues that may develop before the next visit.  Joua Bake, [name], MD  05/22/17 4:06 PM Medical Oncology and Hematology [facility] [address] [place], [place] [address] Tel. [phone]    Fax. [phone]    This  document serves as a record of services personally performed by Lurline Del, MD. It was created on his behalf by Steva Colder, a trained medical scribe. The creation of this record is based on the scribe's personal observations and the provider's statements to them.   I have reviewed the above documentation for accuracy and completeness, and I agree with the above.

## 2017-05-22 NOTE — Telephone Encounter (Signed)
Oral Chemotherapy Pharmacist Encounter   I spoke with patient for overview of: Ibrance.   Pt is doing well. Counseled patient on administration, dosing, side effects, monitoring, drug-food interactions, safe handling, storage, and disposal.  Patient will take Ibrance 75mg  capsules, 1 capsule by mouth every other day with lunch (most consistent meal for patient), for 3 weeks on, 1 week off. Patient knows to avoid grapefruit and grapefruit juice.  Patient will receive fulvestrant injections once every 4 weeks after loading schedule. Patient took letrozole this morning. She will discontinue this medication now with plans to change to fulvestrant injections.  Ibrance start date: to be determined at 05/29/17 office visit with Dr Jana Hakim.  Side effects include but not limited to: fatigue, hair loss, GI upset, nausea, decreased blood counts, and increased upper respiratory infections.  Reviewed with patient importance of keeping a medication schedule and plan for any missed doses.  April Rosales voiced understanding and appreciation.   All questions answered. Medication reconciliation performed and medication/allergy list updated.  Patient will pick-up her 1st fill of Ibrance from the [facility] on 05/29/17 when she is at [facility] for office visit appointments.   Patient knows to call the office with questions or concerns. Oral Oncology Clinic will continue to follow.  Thank you,  Johny Drilling, PharmD, BCPS, BCOP 05/22/2017   1:03 PM Oral Oncology Clinic [phone]

## 2017-05-27 NOTE — Progress Notes (Signed)
[facility]  [phone] [phone]   ID: April Rosales DOB: August 12, 1940  MR#: [id number]  OHY#:[id number]  Patient Care Team: [name], MD as PCP - General (Internal Medicine) Magrinat, [name], MD as Consulting Physician (Oncology) PCP: [name], MD GYN: SU: [name] M.D. OTHER MD:  Rodell Perna M.D., [name] MD, Stephannie Peters M.D.  CHIEF COMPLAINT: Stage IV estrogen receptor positive breast cancer  CURRENT TREATMENT:   Fulvestrant, (palbociclib); denosumab/Xgeva  INTERVAL HISTORY: April Rosales returns today for follow-up and treatment of her estrogen receptor positive breast cancer. She is accompanied by her sister.  She has beenon letrozole, with good tolerance. She has temporarily stopped taking the letrozole pending results from today's discussion.   She has not started taking the palbociclib.  She also receives denosumab/Xgeva, with good tolerance.  Since her last visit, she underwent a unilateral left breast ultrasound on 05/13/2017 showing: Interval enlargement of the patient's left breast mass, consistent with biopsy proven invasive mammary carcinoma. It currently measures 1.4 x 0.8 x 0.4 cm (previously 0.7 x 0.7 x 0.3 cm).  REVIEW OF SYSTEMS: April Rosales reports that she is well overall. She spent time with her family for the holidays. She is doing well with her exercise program. She reports that for the past 3 weeks, she has had pain in her right leg that stretches from the right hip to the upper thigh. She also has pain in her right arm. She notes that if she bends over, she can walk without pain. She notes that she takes a Goody's Pain pill to aid this.  She denies unusual headaches, visual changes, nausea, vomiting, or dizziness. There has been no unusual cough, phlegm production, or pleurisy. This been no change in bowel or bladder habits. She denies unexplained fatigue or unexplained weight loss, bleeding, rash, or  fever. A detailed review of systems was otherwise stable.    BREAST CANCER HISTORY: From the original intake note:   Beverlie underwent right lumpectomy in 1985 at [facility] for what sounds like a stage I breast cancer. She tells me she had more than 30 lymph nodes removed from her right axilla and all of them were clear. She received  adjuvant radiation but no systemic treatment.  The patient had recently refused mammography with the last mammogram I can find dating back to August 2010.  More recently the patient presented with left scapular pain radiating down the left arm.  She was evaluated by Dr. [name], who obtained a chest x-ray showing a possible abnormality at T3. He then set up the patient for an MRI of the thoracic spine performed 08/16/2014.  This showed multiple compression fractures  (a similar picture had been noted on lumbar MRI 10/09/2011 ). However at T3 they noted tumor in the vertebral body extending into the left pedicle and into the lateral epidural space, displacing the cord to the right. There was no evidence of cord compression or cord signal abnormality. There were no other areas of tumor identified in the thoracic spine.         The patient was then referred to Dr. Hal Neer who on 08/24/2014 set April Rosales up for CT scans of the chest, abdomen and pelvis. There was a dense mass in the upper inner quadrant of the left breast measuring 1.6 cm. There was a 1.2 cm nodule in the minor fissure of the right lung and some evidence of right lung fibrosis at the site of the prior radiation port.  There was also a thyroid mass measuring 2 cm. However there were no parenchymal lung or liver lesions. Incidental meningoceles were noted as well as sclerosis of the fifth and sixth ribs which were felt to be likely posttraumatic.   On 08/29/2014 the patient underwent bilateral diagnostic mammography with tomosynthesis and left breast ultrasonography.  There were postsurgical changes in  the upper right breast. In the left breast there was an irregular mass measuring 2.3 cm in the upper inner quadrant. This was palpable. Ultrasound showed this to be hypoechoic and to measure 2.0 cm. There were adjacent areas of nodularity. There was no definite lymphadenopathy in the left axilla.  Biopsy of this breast mass 08/29/2014 showed an invasive adenocarcinoma with both ductal and lobular features (there was strong diffuse E-cadherin expression as well as areas with total absence of E-cadherin expression), with the preliminary prognostic profile showing strong estrogen positivity, very weak to near absent progesterone positivity, an MIB-1 of approximately 40%, and HER-2 equivocal  The patient's subsequent history is as detailed below.   PAST MEDICAL HISTORY: Past Medical History:  Diagnosis Date  . Arthritis   . Breast cancer [facility]) 1985/2016   takes [name] daily  . Chronic back pain    stenosis/listhesis  . Family history of ovarian cancer   . Osteoporosis    takes Vit D  . Radiation 11/23/14-12/11/14   30 Gy T1-T5     PAST SURGICAL HISTORY: Past Surgical History:  Procedure Laterality Date  . ABDOMINAL HYSTERECTOMY     1980  . APPENDECTOMY  1977  . APPLICATION OF INTRAOPERATIVE CT SCAN N/A 10/20/2014   Procedure: APPLICATION OF INTRAOPERATIVE CAT SCAN;  Surgeon: [name], MD;  Location: [facility];  Service: Neurosurgery;  Laterality: N/A;  . BREAST SURGERY Right 1985  . THYROID CYST EXCISION  1967    FAMILY HISTORY Family History  Problem Relation Age of Onset  . Heart disease Mother   . Hypertension Mother   . Heart disease Father   . Diabetes Father   . Breast cancer Paternal Aunt        4 paternal aunts with breast cancer over 51  . Prostate cancer Paternal Uncle   . Stroke Paternal Grandfather   . Ovarian cancer Paternal Aunt   . Huntington's disease Other        Nephew, inherited from his father   The patient's father died from a heart attack at the  age of 75. He had 9 sisters. 3 of those sisters had breast cancer, all in a menopausal setting. Another sister had ovarian cancer. One of the paternal uncles had cancer of the colon "and back".  The patient's mother died at the age of 54. She was found to have breast cancer shortly before dying , during her final hospitalization.  GYNECOLOGIC HISTORY:  No LMP recorded. Patient has had a hysterectomy.  Menarche age 80, first live birth age 33, the patient is GX P1. She underwent hysterectomy in 1980. She thinks the ovaries were removed, but the CT scan obtained 08/24/2014 showed a definite right ovary. The left ovary may have been removed. She did not take hormone replacement after the hysterectomy.  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 She is divorced, lives alone, with 2 cats. Her son Bethann Berkshire lives in [place] where he works in Engineer, technical sales. He has 4 children of his own. The patient is a Psychologist, forensic.    ADVANCED DIRECTIVES:  In place. The patient has named her sister  Pleas Koch [phone]) and her friend Janina Mayo [phone]) as joint healthcare powers of attorney   HEALTH MAINTENANCE: Social History   Tobacco Use  . Smoking status: Former Smoker    Packs/day: 0.50    Years: 10.00    Pack years: 5.00    Types: Cigarettes  . Smokeless tobacco: Former Systems developer  . Tobacco comment: quit smoking 1970's  Substance Use Topics  . Alcohol use: Yes    Alcohol/week: 0.0 oz    Comment: Rare  . Drug use: No     Colonoscopy: never  PAP: status post hysterectomy  Bone density: 08/14/2014 at [facility], results pending  Lipid panel:  Allergies  Allergen Reactions  . Ativan [Lorazepam]     Made her crazy  . Dilaudid [Hydromorphone Hcl]     Doesn't want  . Haldol [Haloperidol Lactate]     Made her crazy    Current Outpatient Medications  Medication Sig Dispense Refill  . Ascorbic Acid (VITAMIN C PO) Take 1 tablet by mouth every other day. Takes qod    . aspirin  EC 325 MG tablet Take 1 tablet (325 mg total) by mouth daily. 30 tablet 0  . Cholecalciferol (VITAMIN D3 ADULT GUMMIES PO) Take 4,000 Units by mouth daily.    . Cyanocobalamin (VITAMIN B-12 PO) Take by mouth daily. Takes every other day    . gabapentin (NEURONTIN) 100 MG capsule Take 2 capsules (200 mg total) by mouth at bedtime. 60 capsule 5  . gabapentin (NEURONTIN) 100 MG capsule Take 1 capsule (100 mg total) by mouth at bedtime. 90 capsule 4  . letrozole (FEMARA) 2.5 MG tablet TAKE 1 TABLET(2.5 MG) BY MOUTH DAILY 90 tablet 0  . palbociclib (IBRANCE) 75 MG capsule Take 1 tablet with food every other day, 3 weeks on, one week off 11 capsule 6   No current facility-administered medications for this visit.     OBJECTIVE:  Older white woman who appears stated age  In no acute distress Vitals:   05/29/17 1432  BP: (!) 146/81  Pulse: (!) 111  Resp: 18  Temp: (!) 97.5 F (36.4 C)  SpO2: 97%     Body mass index is 23.24 kg/m.    ECOG FS:1 - Symptomatic but completely ambulatory  Sclerae unicteric, EOMs intact Oropharynx clear and moist No cervical or supraclavicular adenopathy Lungs no rales or rhonchi Heart regular rate and rhythm Abd soft, nontender, positive bowel sounds MSK no focal spinal tenderness, no upper extremity lymphedema; she is walking with a slight stoop and if she stoops significantly while walking she says "I can run". Neuro: nonfocal, well oriented, appropriate affect Breasts: The right breast is status post remote lumpectomy.  I do not palpate any masses in either breast.  Both axillae are benign  LAB RESULTS:  CMP     Component Value Date/Time   NA 141 05/29/2017 1417   K 3.6 05/29/2017 1417   CL 105 07/07/2016 1125   CO2 27 05/29/2017 1417   GLUCOSE 111 05/29/2017 1417   BUN 17.0 05/29/2017 1417   CREATININE 0.8 05/29/2017 1417   CALCIUM 10.4 05/29/2017 1417   PROT 7.3 05/29/2017 1417   ALBUMIN 3.9 05/29/2017 1417   AST 17 05/29/2017 1417   ALT 15  05/29/2017 1417   ALKPHOS 42 05/29/2017 1417   BILITOT 0.30 05/29/2017 1417   GFRNONAA 85 07/07/2016 1125   GFRAA >89 07/07/2016 1125    INo results found for: SPEP, UPEP  Lab Results  Component Value Date  WBC 5.4 05/29/2017   NEUTROABS 3.8 05/29/2017   HGB 15.1 05/29/2017   HCT 45.1 05/29/2017   MCV 91.7 05/29/2017   PLT 262 05/29/2017      Chemistry      Component Value Date/Time   NA 141 05/29/2017 1417   K 3.6 05/29/2017 1417   CL 105 07/07/2016 1125   CO2 27 05/29/2017 1417   BUN 17.0 05/29/2017 1417   CREATININE 0.8 05/29/2017 1417      Component Value Date/Time   CALCIUM 10.4 05/29/2017 1417   ALKPHOS 42 05/29/2017 1417   AST 17 05/29/2017 1417   ALT 15 05/29/2017 1417   BILITOT 0.30 05/29/2017 1417       Lab Results  Component Value Date   LABCA2 24 01/01/2016    No components found for: IOXBD532  No results for input(s): INR in the last 168 hours.  Urinalysis    Component Value Date/Time   COLORURINE YELLOW 09/19/2015 1239   APPEARANCEUR CLOUDY (A) 09/19/2015 1239   LABSPEC 1.015 09/19/2015 1239   PHURINE 7.5 09/19/2015 1239   GLUCOSEU NEGATIVE 09/19/2015 1239   HGBUR NEGATIVE 09/19/2015 1239   BILIRUBINUR NEGATIVE 09/19/2015 1239   KETONESUR NEGATIVE 09/19/2015 1239   PROTEINUR NEGATIVE 09/19/2015 1239   NITRITE NEGATIVE 09/19/2015 1239   LEUKOCYTESUR NEGATIVE 09/19/2015 1239    STUDIES: US Breast Ltd Uni Left Inc Axilla  Result Date: 05/13/2017 CLINICAL DATA:  77 year old female with known, biopsy proven invasive mammary carcinoma with both ductal and lobular features involving the upper inner quadrant of the left breast and metastases to the upper thoracic spine. Patient is currently undergoing hormonal therapy with letrozole. She has not had surgery for this area. Additionally, patient has history of remote lumpectomy of the right breast with neoadjuvant radiation therapy in 1985. EXAM: ULTRASOUND OF THE LEFT BREAST COMPARISON:   Previous exam(s). FINDINGS: On physical exam, I palpate an area of focal thickening involving the upper-outer quadrant of the left breast. Targeted ultrasound is performed, showing an irregular hypoechoic mass at the 10 o'clock position 5 cm from the nipple. It currently measures 1.4 x 0.8 x 0.4 cm. This is enlarged from prior examination at which time it measured 0.7 x 0.7 x 0.3 cm. Note is made of internal vascularity. IMPRESSION: Interval enlargement of the patient's left breast mass, consistent with biopsy proven invasive mammary carcinoma. It currently measures 1.4 x 0.8 x 0.4 cm (previously 0.7 x 0.7 x 0.3 cm). RECOMMENDATION: 1. Recommendation is for continued clinical treatment plan. 2. Patient is due for yearly bilateral mammogram in June of 2019. Additional evaluation with ultrasound can be performed at that time for direct comparison. I have discussed the findings and recommendations with the patient. Results were also provided in writing at the conclusion of the visit. If applicable, a reminder letter will be sent to the patient regarding the next appointment. BI-RADS CATEGORY  6: Known biopsy-proven malignancy. Electronically Signed   By: Kristopher Oppenheim M.D.   On: 05/13/2017 11:30    ASSESSMENT: 77 y.o. [place] woman with stage IV left breast cancer involving bone  (1) status post right lumpectomy and axillary node dissection in 1985 followed by radiation at [facility] 08/16/2014: measurable disease in spine, lung and left breast   (2) evaluation for left shoulder pain led to thoracic spine MRI 08/16/2014 showing a pathologic fracture at T3 with epidural tumor displacing the cord to the right, but no cord compression. CT scans of the chest, abdomen and pelvis  08/24/2014 showed in addition a mass in the upper outer quadrant left breast measuring 1.6 cm and a nodule in the minor fissure of the right lung measuring 1.2 cm, but no parenchymal lung or liver lesions.   (a) CA 27-29  was noninformative at 38 (09/20/2014)    (3) mammography and ultrasonography 08/29/2014 show a mass in the upper inner left breast which was palpable,  measuring 2.0 cm by ultrasound. Biopsy of this mass 08/29/2014 showed an invasive breast cancer with both lobular and ductal features, estrogen receptor positive, progesterone receptor weakly positive, with an MIB-1 in the 40% range, HER-2 equivocal (6 else ratio 1.5, but average number her nucleus 5.8)    (4) letrozole started 08/31/2014;   (a) palbociclib added Sept 2016 at 75 mg 21/7, with significant neutropenia; not repeated after first cycle  (b) letrozole discontinued 05/29/2017 with evidence of disease progression in the breast   (5)  zolendronate started 09/20/2014, stopped after initial dose due to poor tolerance  (a) denosumab/ Xgeva started 01/31/2015, repeated every 4 weeks, changed to every 8 weeks as of August 2018.    (6) on 10/20/2014 the patient underwent T2-T3 and T4 decompressive laminectomy with removal of epidural tumor, C7-T4 segmental pedicle screw instrumentation with virage screw system with arrow guidance protocol and C7-T4 posterolateral fusion. The cells were positive for the estrogen receptor. HER-2/neu testing by [facility] showed again equivocal results,  (a) most recent bone scan 10/01/2016 finds no new lesions only postoperative changes  (7) radiation 11/23/2014-12/11/2014.  (a) T1-T5 was treated to 30 Gy in 12 fractions at 2.5 Gy per fraction   (8)  consider eventual left lumpectomy or mastectomy depending on  longer-term results of systemic therapy  (a) most recent left breast ultrasonography 10/30/2016 found this mass to measure 0.7 cm  (9) genetics testing using the Breast/Ovarian Cancer Panel through [facility] [name], MD) found no deleterious mutations in ATM, BARD1, BRCA1, BRCA2, BRIP1, CDH1, CHEK2, EPCAM, FANCC, MLH1, MSH2, MSH6, NBN, PALB2, PMS2, PTEN, RAD51C, RAD51D, STK11, TP53, or XRCC2    (10) right  thyroid nodule is a complex cyst as noted on CT scan of the neck 01/29/2016  (11) osteoporosis: [facility] 09/12/2016 showed a T score of -4.8.  (a) continue denosumab/Xgeva as above  (12) fulvestrant started 05/29/2017  (a) to start palbociclib with second month of Faslodex  PLAN: I spent a little over 40 minutes with April Rosales and her sister going over [place] situation.  We reviewed the fact that she has metastatic breast cancer which is not curable.  This cancer will grow through any treatment we try.  I emphasized this because after discussing Faslodex today the sister wanted to know why we did not start with that drug instead of letrozole.  If we had started with Faslodex at some point the cancer would have grown through that drug and then we would have switched to something like letrozole.  She is stopping the letrozole and starting Faslodex today.  We discussed the possible toxicities side effects and complications of this agent in depth.  It generally is very well tolerated except for the shot itself.  After she has been on Faslodex for 1 month we will add palbociclib.  We are going to start at a low dose because of her age and general poor tolerance of medication  In addition I am obtaining a bone scan and an MRI of the lumbar spine.  I think she may well have a compression fracture of the lumbar  spine which may explain why when she bends forward the pain goes away.  She may be a candidate for repeat kyphoplasty if that is the case.  She will return in 2 weeks for second Faslodex dose and then in 4 weeks to see me again.  At that point we will start the palbociclib.  She will be restaged including an ultrasound of the breast in 6 months  She knows to call for any other problems that may develop before her next visit here.  Magrinat, [name], MD  05/29/17 3:05 PM Medical Oncology and Hematology [facility] [address] [place], [place] [address] Tel.  [phone]    Fax. [phone]   This document serves as a record of services personally performed by Lurline Del, MD. It was created on his behalf by Sheron Nightingale, a trained medical scribe. The creation of this record is based on the scribe's personal observations and the provider's statements to them.   I have reviewed the above documentation for accuracy and completeness, and I agree with the above.

## 2017-05-28 MED FILL — IBRANCE 75 MG CAPSULE: 75 | 21 days supply | Qty: 21 | Fill #0

## 2017-05-29 ENCOUNTER — Other Ambulatory Visit: Payer: Self-pay | Admitting: *Deleted

## 2017-05-29 ENCOUNTER — Other Ambulatory Visit (HOSPITAL_BASED_OUTPATIENT_CLINIC_OR_DEPARTMENT_OTHER): Payer: Medicare Other

## 2017-05-29 ENCOUNTER — Ambulatory Visit (HOSPITAL_BASED_OUTPATIENT_CLINIC_OR_DEPARTMENT_OTHER): Payer: Medicare Other | Admitting: Oncology

## 2017-05-29 ENCOUNTER — Telehealth: Payer: Self-pay | Admitting: Oncology

## 2017-05-29 ENCOUNTER — Ambulatory Visit (HOSPITAL_BASED_OUTPATIENT_CLINIC_OR_DEPARTMENT_OTHER): Payer: Medicare Other

## 2017-05-29 VITALS — BP 146/81 | HR 111 | Temp 97.5°F | Resp 18 | Ht 61.5 in | Wt 125.0 lb

## 2017-05-29 DIAGNOSIS — C7951 Secondary malignant neoplasm of bone: Secondary | ICD-10-CM

## 2017-05-29 DIAGNOSIS — C50912 Malignant neoplasm of unspecified site of left female breast: Secondary | ICD-10-CM

## 2017-05-29 DIAGNOSIS — M810 Age-related osteoporosis without current pathological fracture: Secondary | ICD-10-CM | POA: Diagnosis not present

## 2017-05-29 DIAGNOSIS — Z170 Estrogen receptor positive status [ER+]: Secondary | ICD-10-CM

## 2017-05-29 DIAGNOSIS — C50412 Malignant neoplasm of upper-outer quadrant of left female breast: Secondary | ICD-10-CM | POA: Diagnosis not present

## 2017-05-29 DIAGNOSIS — C50911 Malignant neoplasm of unspecified site of right female breast: Secondary | ICD-10-CM

## 2017-05-29 DIAGNOSIS — M79604 Pain in right leg: Secondary | ICD-10-CM

## 2017-05-29 DIAGNOSIS — Z5111 Encounter for antineoplastic chemotherapy: Secondary | ICD-10-CM | POA: Diagnosis present

## 2017-05-29 DIAGNOSIS — C50919 Malignant neoplasm of unspecified site of unspecified female breast: Secondary | ICD-10-CM

## 2017-05-29 DIAGNOSIS — M25551 Pain in right hip: Secondary | ICD-10-CM

## 2017-05-29 LAB — COMPREHENSIVE METABOLIC PANEL
ALT: 15 U/L (ref 0–55)
ANION GAP: 8 meq/L (ref 3–11)
AST: 17 U/L (ref 5–34)
Albumin: 3.9 g/dL (ref 3.5–5.0)
Alkaline Phosphatase: 42 U/L (ref 40–150)
BILIRUBIN TOTAL: 0.3 mg/dL (ref 0.20–1.20)
BUN: 17 mg/dL (ref 7.0–26.0)
CALCIUM: 10.4 mg/dL (ref 8.4–10.4)
CHLORIDE: 105 meq/L (ref 98–109)
CO2: 27 meq/L (ref 22–29)
Creatinine: 0.8 mg/dL (ref 0.6–1.1)
EGFR: >60 mL/min/{1.73_m2} (ref >60)
Glucose: 111 mg/dl (ref 70–140)
Potassium: 3.6 mEq/L (ref 3.5–5.1)
Sodium: 141 mEq/L (ref 136–145)
TOTAL PROTEIN: 7.3 g/dL (ref 6.4–8.3)

## 2017-05-29 LAB — CBC WITH DIFFERENTIAL/PLATELET
BASO%: 1 % (ref 0.0–2.0)
BASOS ABS: 0.1 10*3/uL (ref 0.0–0.1)
EOS ABS: 0.1 10*3/uL (ref 0.0–0.5)
EOS%: 2.5 % (ref 0.0–7.0)
HCT: 45.1 % (ref 34.8–46.6)
HGB: 15.1 g/dL (ref 11.6–15.9)
LYMPH%: 19.8 % (ref 14.0–49.7)
MCH: 30.7 pg (ref 25.1–34.0)
MCHC: 33.5 g/dL (ref 31.5–36.0)
MCV: 91.7 fL (ref 79.5–101.0)
MONO#: 0.4 10*3/uL (ref 0.1–0.9)
MONO%: 7.6 % (ref 0.0–14.0)
NEUT#: 3.8 10*3/uL (ref 1.5–6.5)
NEUT%: 69.1 % (ref 38.4–76.8)
Platelets: 262 10*3/uL (ref 145–400)
RBC: 4.91 10*6/uL (ref 3.70–5.45)
RDW: 14.1 % (ref 11.2–14.5)
WBC: 5.4 10*3/uL (ref 3.9–10.3)
lymph#: 1.1 10*3/uL (ref 0.9–3.3)

## 2017-05-29 MED ORDER — FULVESTRANT 250 MG/5ML IM SOLN
500.0000 mg | Freq: Once | INTRAMUSCULAR | Status: AC
  Administered 2017-05-29: 500 mg via INTRAMUSCULAR

## 2017-05-29 NOTE — Telephone Encounter (Signed)
Gave patient AVs and calendar of upcoming January and February appointments.  °

## 2017-05-29 NOTE — Patient Instructions (Signed)

## 2017-05-30 ENCOUNTER — Other Ambulatory Visit: Payer: Self-pay | Admitting: Oncology

## 2017-06-05 ENCOUNTER — Ambulatory Visit (HOSPITAL_COMMUNITY)
Admission: RE | Admit: 2017-06-05 | Discharge: 2017-06-05 | Disposition: A | Discharge status: Home / Self Care | Payer: Medicare Other | Source: Ambulatory Visit | Attending: Oncology | Admitting: Oncology

## 2017-06-05 DIAGNOSIS — M47816 Spondylosis without myelopathy or radiculopathy, lumbar region: Secondary | ICD-10-CM | POA: Insufficient documentation

## 2017-06-05 DIAGNOSIS — M438X6 Other specified deforming dorsopathies, lumbar region: Secondary | ICD-10-CM | POA: Diagnosis not present

## 2017-06-05 DIAGNOSIS — C50911 Malignant neoplasm of unspecified site of right female breast: Secondary | ICD-10-CM | POA: Diagnosis not present

## 2017-06-05 DIAGNOSIS — M5126 Other intervertebral disc displacement, lumbar region: Secondary | ICD-10-CM | POA: Diagnosis not present

## 2017-06-05 MED ORDER — GADOBENATE DIMEGLUMINE 529 MG/ML IV SOLN
15.0000 mL | Freq: Once | INTRAVENOUS | Status: AC | PRN
  Administered 2017-06-05: 11 mL via INTRAVENOUS

## 2017-06-08 ENCOUNTER — Other Ambulatory Visit: Payer: Self-pay | Admitting: Oncology

## 2017-06-11 ENCOUNTER — Encounter (HOSPITAL_COMMUNITY)
Admission: RE | Admit: 2017-06-11 | Discharge: 2017-06-11 | Disposition: A | Discharge status: Home / Self Care | Payer: Medicare Other | Source: Ambulatory Visit | Attending: Oncology | Admitting: Oncology

## 2017-06-11 DIAGNOSIS — Z170 Estrogen receptor positive status [ER+]: Secondary | ICD-10-CM | POA: Insufficient documentation

## 2017-06-11 DIAGNOSIS — C7951 Secondary malignant neoplasm of bone: Secondary | ICD-10-CM

## 2017-06-11 DIAGNOSIS — C50912 Malignant neoplasm of unspecified site of left female breast: Secondary | ICD-10-CM

## 2017-06-11 DIAGNOSIS — C50412 Malignant neoplasm of upper-outer quadrant of left female breast: Secondary | ICD-10-CM | POA: Diagnosis not present

## 2017-06-11 DIAGNOSIS — C50919 Malignant neoplasm of unspecified site of unspecified female breast: Secondary | ICD-10-CM | POA: Diagnosis not present

## 2017-06-11 MED ORDER — TECHNETIUM TC 99M MEDRONATE IV KIT
25.0000 | PACK | Freq: Once | INTRAVENOUS | Status: AC | PRN
  Administered 2017-06-11: 21.7 via INTRAVENOUS

## 2017-06-12 ENCOUNTER — Inpatient Hospital Stay: Payer: Medicare Other | Attending: Oncology

## 2017-06-12 ENCOUNTER — Other Ambulatory Visit: Payer: Medicare Other

## 2017-06-12 DIAGNOSIS — Z5111 Encounter for antineoplastic chemotherapy: Secondary | ICD-10-CM | POA: Diagnosis not present

## 2017-06-12 DIAGNOSIS — M810 Age-related osteoporosis without current pathological fracture: Secondary | ICD-10-CM

## 2017-06-12 DIAGNOSIS — C50919 Malignant neoplasm of unspecified site of unspecified female breast: Secondary | ICD-10-CM

## 2017-06-12 DIAGNOSIS — C50412 Malignant neoplasm of upper-outer quadrant of left female breast: Secondary | ICD-10-CM | POA: Insufficient documentation

## 2017-06-12 DIAGNOSIS — Z170 Estrogen receptor positive status [ER+]: Secondary | ICD-10-CM | POA: Diagnosis not present

## 2017-06-12 DIAGNOSIS — C7951 Secondary malignant neoplasm of bone: Secondary | ICD-10-CM | POA: Diagnosis not present

## 2017-06-12 MED ORDER — FULVESTRANT 250 MG/5ML IM SOLN
500.0000 mg | Freq: Once | INTRAMUSCULAR | Status: AC
  Administered 2017-06-12: 500 mg via INTRAMUSCULAR

## 2017-06-12 NOTE — Patient Instructions (Signed)

## 2017-06-16 ENCOUNTER — Other Ambulatory Visit: Payer: Medicare Other

## 2017-06-16 ENCOUNTER — Ambulatory Visit: Payer: Medicare Other

## 2017-06-24 NOTE — Progress Notes (Signed)
[place]  Telephone:([phone]) [phone] Fax:(336) [phone]   ID: April Rosales DOB: 1940/06/30  MR#: [id number]  YIF#:[id number]  Patient Care Team: [name], MD as PCP - General (Internal Medicine) Magrinat, [name], MD as Consulting Physician (Oncology) [name], MD as Consulting Physician (Orthopedic Surgery) [name], MD as Consulting Physician (General Surgery) PCP: [name], MD  CHIEF COMPLAINT: Stage IV estrogen receptor positive breast cancer  CURRENT TREATMENT:   Fulvestrant, palbociclib; denosumab/Xgeva  INTERVAL HISTORY: April Rosales returns today for follow-up and treatment of her estrogen receptor positive breast cancer accompanied by her sister. She has received her palbociclib prescription, but she has not started it yet. She notes that she will start on 06/29/2016.   She also receives denosomab/Xgeva, with good tolerance. She notes that it was a little painful at first.  Since her last visit, she completed a bone scan on 06/11/2017 showing: no evidence of metastatic disease.   She also completed a MRI of the lumbar spine on 06/05/2017 showing: No metastatic disease of the lumbar spine or lumbar spinal cord/cauda equina identified. No acute osseous abnormality. Stable L1 through L3 compression deformities. Stable lumbar spondylosis with multilevel disc and facet degenerative changes. No high-grade foraminal or canal stenosis.  REVIEW OF SYSTEMS: April Rosales reports mild pain in her right lower back and hip. She notes that she has difficulty walking and taking a step forward due to the pain. She also notes that she has been using a cane in order to help her walk. She denies unusual headaches, visual changes, nausea, vomiting, or dizziness. There has been no unusual cough, phlegm production, or pleurisy. This been no change in bowel or bladder habits. She denies unexplained fatigue or unexplained weight loss, bleeding, rash, or fever. A detailed  review of systems was otherwise stable.    BREAST CANCER HISTORY: From the original intake note:   April Rosales underwent right lumpectomy in [facility] at [facility] for what sounds like a stage I breast cancer. She tells me she had more than 30 lymph nodes removed from her right axilla and all of them were clear. She received  adjuvant radiation but no systemic treatment.  The patient had recently refused mammography with the last mammogram I can find dating back to August 2010.  More recently the patient presented with left scapular pain radiating down the left arm.  She was evaluated by Dr. [name], who obtained a chest x-ray showing a possible abnormality at T3. He then set up the patient for an MRI of the thoracic spine performed 08/16/2014.  This showed multiple compression fractures  (a similar picture had been noted on lumbar MRI 10/09/2011 ). However at T3 they noted tumor in the vertebral body extending into the left pedicle and into the lateral epidural space, displacing the cord to the right. There was no evidence of cord compression or cord signal abnormality. There were no other areas of tumor identified in the thoracic spine.         The patient was then referred to Dr. Hal Neer who on 08/24/2014 set April Rosales up for CT scans of the chest, abdomen and pelvis. There was a dense mass in the upper inner quadrant of the left breast measuring 1.6 cm. There was a 1.2 cm nodule in the minor fissure of the right lung and some evidence of right lung fibrosis at the site of the prior radiation port.  There was also a thyroid mass measuring 2 cm. However there were no parenchymal  lung or liver lesions. Incidental meningoceles were noted as well as sclerosis of the fifth and sixth ribs which were felt to be likely posttraumatic.   On 08/29/2014 the patient underwent bilateral diagnostic mammography with tomosynthesis and left breast ultrasonography.  There were postsurgical changes in the upper right  breast. In the left breast there was an irregular mass measuring 2.3 cm in the upper inner quadrant. This was palpable. Ultrasound showed this to be hypoechoic and to measure 2.0 cm. There were adjacent areas of nodularity. There was no definite lymphadenopathy in the left axilla.  Biopsy of this breast mass 08/29/2014 showed an invasive adenocarcinoma with both ductal and lobular features (there was strong diffuse E-cadherin expression as well as areas with total absence of E-cadherin expression), with the preliminary prognostic profile showing strong estrogen positivity, very weak to near absent progesterone positivity, an MIB-1 of approximately 40%, and HER-2 equivocal  The patient's subsequent history is as detailed below.   PAST MEDICAL HISTORY: Past Medical History:  Diagnosis Date  . Arthritis   . Breast cancer [facility]) 1985/2016   takes [name] daily  . Chronic back pain    stenosis/listhesis  . Family history of ovarian cancer   . Osteoporosis    takes Vit D  . Radiation 11/23/14-12/11/14   30 Gy T1-T5     PAST SURGICAL HISTORY: Past Surgical History:  Procedure Laterality Date  . ABDOMINAL HYSTERECTOMY     1980  . APPENDECTOMY  1977  . APPLICATION OF INTRAOPERATIVE CT SCAN N/A 10/20/2014   Procedure: APPLICATION OF INTRAOPERATIVE CAT SCAN;  Surgeon: [name], MD;  Location: [place] [facility];  Service: Neurosurgery;  Laterality: N/A;  . BREAST SURGERY Right 1985  . THYROID CYST EXCISION  1967    FAMILY HISTORY Family History  Problem Relation Age of Onset  . Heart disease Mother   . Hypertension Mother   . Heart disease Father   . Diabetes Father   . Breast cancer Paternal Aunt        4 paternal aunts with breast cancer over 17  . Prostate cancer Paternal Uncle   . Stroke Paternal Grandfather   . Ovarian cancer Paternal Aunt   . Huntington's disease Other        Nephew, inherited from his father   The patient's father died from a heart attack at the age of 46. He  had 9 sisters. 3 of those sisters had breast cancer, all in a menopausal setting. Another sister had ovarian cancer. One of the paternal uncles had cancer of the colon "and back".  The patient's mother died at the age of 88. She was found to have breast cancer shortly before dying , during her final hospitalization.  GYNECOLOGIC HISTORY:  No LMP recorded. Patient has had a hysterectomy.  Menarche age 18, first live birth age 28, the patient is GX P1. She underwent hysterectomy in 1980. She thinks the ovaries were removed, but the CT scan obtained 08/24/2014 showed a definite right ovary. The left ovary may have been removed. She did not take hormone replacement after the hysterectomy.  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 She is divorced, lives alone, with 2 cats. Her son Bethann Berkshire lives in [place] where he works in Engineer, technical sales. He has 4 children of his own. The patient is a Psychologist, forensic.    ADVANCED DIRECTIVES:  In place. The patient has named her sister Pleas Koch [phone]) and her friend Janina Mayo [phone]) as joint healthcare powers of attorney  HEALTH MAINTENANCE: Social History   Tobacco Use  . Smoking status: Former Smoker    Packs/day: 0.50    Years: 10.00    Pack years: 5.00    Types: Cigarettes  . Smokeless tobacco: Former Systems developer  . Tobacco comment: quit smoking 1970's  Substance Use Topics  . Alcohol use: Yes    Alcohol/week: 0.0 oz    Comment: Rare  . Drug use: No     Colonoscopy: never  PAP: status post hysterectomy  Bone density: 08/14/2014 at [facility], results pending  Lipid panel:  Allergies  Allergen Reactions  . Ativan [Lorazepam]     Made her crazy  . Dilaudid [Hydromorphone Hcl]     Doesn't want  . Haldol [Haloperidol Lactate]     Made her crazy    Current Outpatient Medications  Medication Sig Dispense Refill  . Ascorbic Acid (VITAMIN C PO) Take 1 tablet by mouth every other day. Takes qod    . aspirin EC 325 MG  tablet Take 1 tablet (325 mg total) by mouth daily. 30 tablet 0  . Cholecalciferol (VITAMIN D3 ADULT GUMMIES PO) Take 4,000 Units by mouth daily.    . Cyanocobalamin (VITAMIN B-12 PO) Take by mouth daily. Takes every other day    . gabapentin (NEURONTIN) 100 MG capsule Take 1 capsule (100 mg total) by mouth at bedtime. 90 capsule 4  . palbociclib (IBRANCE) 75 MG capsule Take 1 tablet with food every other day, 3 weeks on, one week off 11 capsule 6   No current facility-administered medications for this visit.     OBJECTIVE:  Older white woman in no acute distress  In no acute distress Vitals:   06/26/17 1255  BP: (!) 142/81  Pulse: 88  Resp: 18  Temp: 98 F (36.7 C)  SpO2: 98%     Body mass index is 23.01 kg/m.    ECOG FS:1 - Symptomatic but completely ambulatory  Sclerae unicteric, pupils round and equal No cervical or supraclavicular adenopathy Lungs no rales or rhonchi Heart regular rate and rhythm Abd soft, nontender, positive bowel sounds MSK significant cervical spine distortion secondary to earlier surgery; mild lumbar spinal tenderness, no upper extremity lymphedema Neuro: nonfocal, well oriented, pleasant affect Breasts: The right breast is status post remote lumpectomy.  I do not palpate a mass in the left breast.  Both axillae are benign  LAB RESULTS:  CMP     Component Value Date/Time   NA 142 06/26/2017 1216   NA 141 05/29/2017 1417   K 4.2 06/26/2017 1216   K 3.6 05/29/2017 1417   CL 103 06/26/2017 1216   CO2 29 06/26/2017 1216   CO2 27 05/29/2017 1417   GLUCOSE 83 06/26/2017 1216   GLUCOSE 111 05/29/2017 1417   BUN 17 06/26/2017 1216   BUN 17.0 05/29/2017 1417   CREATININE 0.71 06/26/2017 1216   CREATININE 0.8 05/29/2017 1417   CALCIUM 10.5 (H) 06/26/2017 1216   CALCIUM 10.4 05/29/2017 1417   PROT 7.1 06/26/2017 1216   PROT 7.3 05/29/2017 1417   ALBUMIN 3.7 06/26/2017 1216   ALBUMIN 3.9 05/29/2017 1417   AST 20 06/26/2017 1216   AST 17  05/29/2017 1417   ALT 13 06/26/2017 1216   ALT 15 05/29/2017 1417   ALKPHOS 42 06/26/2017 1216   ALKPHOS 42 05/29/2017 1417   BILITOT 0.4 06/26/2017 1216   BILITOT 0.30 05/29/2017 1417   GFRNONAA >60 06/26/2017 1216   GFRNONAA 85 07/07/2016 1125   GFRAA >60 06/26/2017  [place] 07/07/2016 1125    INo results found for: SPEP, UPEP  Lab Results  Component Value Date   WBC 5.1 06/26/2017   NEUTROABS 3.3 06/26/2017   HGB 14.7 06/26/2017   HCT 43.4 06/26/2017   MCV 91.4 06/26/2017   PLT 236 06/26/2017      Chemistry      Component Value Date/Time   NA 142 06/26/2017 1216   NA 141 05/29/2017 1417   K 4.2 06/26/2017 1216   K 3.6 05/29/2017 1417   CL 103 06/26/2017 1216   CO2 29 06/26/2017 1216   CO2 27 05/29/2017 1417   BUN 17 06/26/2017 1216   BUN 17.0 05/29/2017 1417   CREATININE 0.71 06/26/2017 1216   CREATININE 0.8 05/29/2017 1417      Component Value Date/Time   CALCIUM 10.5 (H) 06/26/2017 1216   CALCIUM 10.4 05/29/2017 1417   ALKPHOS 42 06/26/2017 1216   ALKPHOS 42 05/29/2017 1417   AST 20 06/26/2017 1216   AST 17 05/29/2017 1417   ALT 13 06/26/2017 1216   ALT 15 05/29/2017 1417   BILITOT 0.4 06/26/2017 1216   BILITOT 0.30 05/29/2017 1417       Lab Results  Component Value Date   LABCA2 24 01/01/2016    No components found for: [id number]  No results for input(s): INR in the last 168 hours.  Urinalysis    Component Value Date/Time   COLORURINE YELLOW 09/19/2015 1239   APPEARANCEUR CLOUDY (A) 09/19/2015 1239   LABSPEC 1.015 09/19/2015 1239   PHURINE 7.5 09/19/2015 1239   GLUCOSEU NEGATIVE 09/19/2015 1239   HGBUR NEGATIVE 09/19/2015 1239   BILIRUBINUR NEGATIVE 09/19/2015 1239   KETONESUR NEGATIVE 09/19/2015 1239   PROTEINUR NEGATIVE 09/19/2015 1239   NITRITE NEGATIVE 09/19/2015 1239   LEUKOCYTESUR NEGATIVE 09/19/2015 1239    STUDIES: Mr Lumbar Spine W Wo Contrast  Result Date: 06/05/2017 CLINICAL DATA:  77 y/o F; stage IV invasive  breast cancer. Patient complains of lower back pain radiating into the right hip. Evaluate for metastatic disease. EXAM: MRI LUMBAR SPINE WITHOUT AND WITH CONTRAST TECHNIQUE: Multiplanar and multiecho pulse sequences of the lumbar spine were obtained without and with intravenous contrast. CONTRAST:  8m MULTIHANCE GADOBENATE DIMEGLUMINE 529 MG/ML IV SOLN COMPARISON:  10/01/2016 bone scan. 03/27/2016 lumbar radiographs. 10/09/2011 lumbar MRI. FINDINGS: Segmentation:  Standard. Alignment: Mild L3 lumbar levocurvature. Straightening of lumbar lordosis. No listhesis. Vertebrae: No acute fracture, evidence of discitis, or bone lesion. No abnormal enhancement. Stable moderate L1 and L2 compression deformities. Stable mild L3 central compression deformity. Conus medullaris and cauda equina: Conus extends to the L1 level. Conus and cauda equina appear normal. Paraspinal and other soft tissues: Negative. Disc levels: L1-2: Disc bulge with endplate marginal osteophytes and mild facet hypertrophy. Mild right foraminal stenosis. No canal stenosis. L2-3: Disc bulge eccentric to the right with marginal osteophytes and facet hypertrophy. Mild right foraminal stenosis. No canal stenosis. L3-4: Disc bulge with endplate marginal osteophytes and facet hypertrophy. Mild bilateral foraminal stenosis. No canal stenosis. L4-5: Disc bulge eccentric to the right. Mild facet hypertrophy. Mild right foraminal stenosis. No canal stenosis. L5-S1: Stable small left-sided extraforaminal disc protrusion contacting the exiting left L5 nerve root. No right foraminal or canal stenosis. IMPRESSION: 1. No metastatic disease of the lumbar spine or lumbar spinal cord/cauda equina identified. 2. No acute osseous abnormality. 3. Stable L1 through L3 compression deformities. 4. Stable lumbar spondylosis with multilevel disc and facet degenerative changes. No high-grade foraminal or  canal stenosis. Electronically Signed   By: Kristine Garbe M.D.    On: 06/05/2017 18:28   Nm Bone Scan Whole Body  Result Date: 06/12/2017 CLINICAL DATA:  Metastatic breast cancer. EXAM: NUCLEAR MEDICINE WHOLE BODY BONE SCAN TECHNIQUE: Whole body anterior and posterior images were obtained approximately 3 hours after intravenous injection of radiopharmaceutical. RADIOPHARMACEUTICALS:  21.7 mCi Technetium-36mMDP IV COMPARISON:  Bone scan 10/01/2016 MRI thoracic spine 04/02/2016. FINDINGS: Bilateral renal function excretion. Minimal increased activity in the upper thoracic spine again noted in unchanged. Again findings most likely from prior surgery. Thoracolumbar spine scoliosis. Mild areas of increased activity in the thoracolumbar spine most likely degenerative in are unchanged. No evidence of metastatic disease. IMPRESSION: Stable exam.  No evidence of metastatic disease. Electronically Signed   By: TMarcello Moores Register   On: 06/12/2017 08:17    ASSESSMENT: 77y.o. [place] woman with stage IV left breast cancer involving bone  (1) status post right lumpectomy and axillary node dissection in 1985 [facility]: measurable disease in spine, lung and left breast   (2) evaluation for left shoulder pain led to thoracic spine MRI 08/16/2014 showing a pathologic fracture at T3 with epidural tumor displacing the cord to the right, but no cord compression. CT scans of the chest, abdomen and pelvis 08/24/2014 showed in addition a mass in the upper outer quadrant left breast measuring 1.6 cm and a nodule in the minor fissure of the right lung measuring 1.2 cm, but no parenchymal lung or liver lesions.   (a) CA 27-29 was noninformative at 38 (09/20/2014)    (3) mammography and ultrasonography 08/29/2014 show a mass in the upper inner left breast which was palpable,  measuring 2.0 cm by ultrasound. Biopsy of this mass 08/29/2014 showed an invasive breast cancer with both lobular and ductal features, estrogen receptor positive,  progesterone receptor weakly positive, with an MIB-1 in the 40% range, HER-2 equivocal (6 else ratio 1.5, but average number her nucleus 5.8)    (4) letrozole started 08/31/2014;   (a) palbociclib added Sept 2016 at 75 mg 21/7, with significant neutropenia; not repeated after first cycle  (b) letrozole discontinued 05/29/2017 with evidence of disease progression in the breast   (5)  zolendronate started 09/20/2014, stopped after initial dose due to poor tolerance  (a) denosumab/ Xgeva started 01/31/2015, repeated every 4 weeks  (b) changed to every 8 weeks as of August 2018.    (6) on 10/20/2014 the patient underwent T2-T3 and T4 decompressive laminectomy with removal of epidural tumor, C7-T4 segmental pedicle screw instrumentation with virage screw system with arrow guidance protocol and C7-T4 posterolateral fusion. The cells were positive for the estrogen receptor. [facility],  (a) most recent bone scan 10/01/2016 finds no new lesions only postoperative changes  (7) radiation 11/23/2014-12/11/2014.  (a) T1-T5 was treated to 30 Gy in 12 fractions at 2.5 Gy per fraction   (8)  consider eventual left lumpectomy or mastectomy depending on  longer-term results of systemic therapy  (a) most recent left breast ultrasonography 10/30/2016 found this mass to measure 0.7 cm  (b) repeat left breast ultrasonography 05/13/2017 showed the upper outer quadrant mass to have grown to 1.4 cm  (9) genetics testing using the Breast/Ovarian Cancer Panel through [facility] ([name] MD) found no deleterious mutations in ATM, BARD1, BRCA1, BRCA2, BRIP1, CDH1, CHEK2, EPCAM, FANCC, MLH1, MSH2, MSH6, NBN, PALB2, PMS2, PTEN, RAD51C, RAD51D, STK11, TP53, or XRCC2    (  10) right thyroid nodule is a complex cyst as noted on CT scan of the neck 01/29/2016  (11) osteoporosis: [facility] at [facility] 09/12/2016 showed a T score of -4.8.  (a) continue denosumab/Xgeva as  above  (12) fulvestrant started 05/29/2017  (a) started palbociclib at 75 mg every other day for 21 days on, 7 days off  (13)  left lumpectomy pending  PLAN: I spent approximately 30 minutes with April Rosales with most of that time spent discussing her complex problems.  The good news is that we do not detect any bone lesions at present, including by MRI where we know she had prior disease.  She has had an excellent response to treatment.  Given that it is puzzling that the tumor in her left breast has grown.  I think what may be the best explanation is that the bone lesions are from the very remote right breast cancer, and that the left breast cancer is different and possibly estrogen receptor negative.  Accordingly I think this would be a good time to proceed to lumpectomy.  The patient is agreeable.  Generally in stage IV disease we do not do sentinel lymph nodes, but if we are in fact dealing with a second primary it might make sense to obtain lymph nodes chiefly to assess risk of local recurrence and in case we decide to do radiation to the left side.  I have referred the patient back to her surgeon Dr. Dalbert Batman for further discussion and surgery  We also reviewed the fact that she has extensive arthritis in the lumbar area which is what is causing her symptoms at present.  She has seen Dr. Rodell Perna in the past.  I offered to send her back to him or to set her up for physical therapy or to do both.  At this point she does not want to do anything because "one thing at a time".  She will receive her fulvestrant and Xgeva today.  She will start the [facility] 06/29/2017.  She will see me again in 1 month to make sure she is tolerating the Ibrance well.  At this point I am delighted at how well she is doing.  She knows to call for any other issues that may develop before the next visit.     Magrinat, [name], MD  06/26/17 1:37 PM Medical Oncology and Hematology [facility] [address] [place], [place] [address] Tel. [phone]    Fax. [phone]   This document serves as a record of services personally performed by Lurline Del, MD. It was created on his behalf by Sheron Nightingale, a trained medical scribe. The creation of this record is based on the scribe's personal observations and the provider's statements to them.   I have reviewed the above documentation for accuracy and completeness, and I agree with the above.

## 2017-06-26 ENCOUNTER — Inpatient Hospital Stay: Payer: Medicare Other | Attending: Oncology

## 2017-06-26 ENCOUNTER — Other Ambulatory Visit: Payer: Self-pay | Admitting: Oncology

## 2017-06-26 ENCOUNTER — Inpatient Hospital Stay: Payer: Medicare Other

## 2017-06-26 ENCOUNTER — Telehealth: Payer: Self-pay | Admitting: Oncology

## 2017-06-26 ENCOUNTER — Inpatient Hospital Stay (HOSPITAL_BASED_OUTPATIENT_CLINIC_OR_DEPARTMENT_OTHER): Payer: Medicare Other | Admitting: Oncology

## 2017-06-26 VITALS — BP 142/81 | HR 88 | Temp 98.0°F | Resp 18 | Ht 61.5 in | Wt 123.8 lb

## 2017-06-26 DIAGNOSIS — M545 Low back pain: Secondary | ICD-10-CM | POA: Diagnosis not present

## 2017-06-26 DIAGNOSIS — Z5111 Encounter for antineoplastic chemotherapy: Secondary | ICD-10-CM | POA: Insufficient documentation

## 2017-06-26 DIAGNOSIS — C50412 Malignant neoplasm of upper-outer quadrant of left female breast: Secondary | ICD-10-CM | POA: Diagnosis not present

## 2017-06-26 DIAGNOSIS — M25559 Pain in unspecified hip: Secondary | ICD-10-CM | POA: Diagnosis not present

## 2017-06-26 DIAGNOSIS — C7951 Secondary malignant neoplasm of bone: Secondary | ICD-10-CM | POA: Insufficient documentation

## 2017-06-26 DIAGNOSIS — C50912 Malignant neoplasm of unspecified site of left female breast: Secondary | ICD-10-CM

## 2017-06-26 DIAGNOSIS — M1388 Other specified arthritis, other site: Secondary | ICD-10-CM | POA: Diagnosis not present

## 2017-06-26 DIAGNOSIS — Z170 Estrogen receptor positive status [ER+]: Secondary | ICD-10-CM | POA: Diagnosis not present

## 2017-06-26 DIAGNOSIS — M810 Age-related osteoporosis without current pathological fracture: Secondary | ICD-10-CM

## 2017-06-26 DIAGNOSIS — C50919 Malignant neoplasm of unspecified site of unspecified female breast: Secondary | ICD-10-CM

## 2017-06-26 LAB — COMPREHENSIVE METABOLIC PANEL
ALK PHOS: 42 U/L (ref 40–150)
ALT: 13 U/L (ref 0–55)
AST: 20 U/L (ref 5–34)
Albumin: 3.7 g/dL (ref 3.5–5.0)
Anion gap: 10 (ref 3–11)
BILIRUBIN TOTAL: 0.4 mg/dL (ref 0.2–1.2)
BUN: 17 mg/dL (ref 7–26)
CALCIUM: 10.5 mg/dL — AB (ref 8.4–10.4)
CO2: 29 mmol/L (ref 22–29)
Chloride: 103 mmol/L (ref 98–109)
Creatinine, Ser: 0.71 mg/dL (ref 0.60–1.10)
GLUCOSE: 83 mg/dL (ref 70–140)
POTASSIUM: 4.2 mmol/L (ref 3.5–5.1)
Sodium: 142 mmol/L (ref 136–145)
TOTAL PROTEIN: 7.1 g/dL (ref 6.4–8.3)

## 2017-06-26 LAB — CBC WITH DIFFERENTIAL/PLATELET
BASOS ABS: 0 10*3/uL (ref 0.0–0.1)
BASOS PCT: 1 %
Eosinophils Absolute: 0.2 10*3/uL (ref 0.0–0.5)
Eosinophils Relative: 3 %
HEMATOCRIT: 43.4 % (ref 34.8–46.6)
Hemoglobin: 14.7 g/dL (ref 11.6–15.9)
LYMPHS PCT: 24 %
Lymphs Abs: 1.2 10*3/uL (ref 0.9–3.3)
MCH: 30.9 pg (ref 25.1–34.0)
MCHC: 33.9 g/dL (ref 31.5–36.0)
MCV: 91.4 fL (ref 79.5–101.0)
MONO ABS: 0.4 10*3/uL (ref 0.1–0.9)
Monocytes Relative: 8 %
NEUTROS ABS: 3.3 10*3/uL (ref 1.5–6.5)
Neutrophils Relative %: 64 %
PLATELETS: 236 10*3/uL (ref 145–400)
RBC: 4.75 MIL/uL (ref 3.70–5.45)
RDW: 13.8 % (ref 11.2–14.5)
WBC: 5.1 10*3/uL (ref 3.9–10.3)

## 2017-06-26 MED ORDER — FULVESTRANT 250 MG/5ML IM SOLN
500.0000 mg | Freq: Once | INTRAMUSCULAR | Status: AC
  Administered 2017-06-26: 500 mg via INTRAMUSCULAR

## 2017-06-26 MED ORDER — DENOSUMAB 120 MG/1.7ML ~~LOC~~ SOLN
120.0000 mg | Freq: Once | SUBCUTANEOUS | Status: AC
  Administered 2017-06-26: 120 mg via SUBCUTANEOUS

## 2017-06-26 NOTE — Telephone Encounter (Signed)
Gave patient avs and calendar with appts per 2/1 los.  °

## 2017-06-26 NOTE — Patient Instructions (Signed)
Denosumab injection What is this medicine? DENOSUMAB (den oh sue mab) slows bone breakdown. Prolia is used to treat osteoporosis in women after menopause and in men. Delton See is used to treat a high calcium level due to cancer and to prevent bone fractures and other bone problems caused by multiple myeloma or cancer bone metastases. Delton See is also used to treat giant cell tumor of the bone. This medicine may be used for other purposes; ask your health care provider or pharmacist if you have questions. COMMON BRAND NAME(S): Prolia, XGEVA What should I tell my health care provider before I take this medicine? They need to know if you have any of these conditions: -dental disease -having surgery or tooth extraction -infection -kidney disease -low levels of calcium or Vitamin D in the blood -malnutrition -on hemodialysis -skin conditions or sensitivity -thyroid or parathyroid disease -an unusual reaction to denosumab, other medicines, foods, dyes, or preservatives -pregnant or trying to get pregnant -breast-feeding How should I use this medicine? This medicine is for injection under the skin. It is given by a health care professional in a hospital or clinic setting. If you are getting Prolia, a special MedGuide will be given to you by the pharmacist with each prescription and refill. Be sure to read this information carefully each time. For Prolia, talk to your pediatrician regarding the use of this medicine in children. Special care may be needed. For Delton See, talk to your pediatrician regarding the use of this medicine in children. While this drug may be prescribed for children as young as 13 years for selected conditions, precautions do apply. Overdosage: If you think you have taken too much of this medicine contact a poison control center or emergency room at once. NOTE: This medicine is only for you. Do not share this medicine with others. What if I miss a dose? It is important not to miss your  dose. Call your doctor or health care professional if you are unable to keep an appointment. What may interact with this medicine? Do not take this medicine with any of the following medications: -other medicines containing denosumab This medicine may also interact with the following medications: -medicines that lower your chance of fighting infection -steroid medicines like prednisone or cortisone This list may not describe all possible interactions. Give your health care provider a list of all the medicines, herbs, non-prescription drugs, or dietary supplements you use. Also tell them if you smoke, drink alcohol, or use illegal drugs. Some items may interact with your medicine. What should I watch for while using this medicine? Visit your doctor or health care professional for regular checks on your progress. Your doctor or health care professional may order blood tests and other tests to see how you are doing. Call your doctor or health care professional for advice if you get a fever, chills or sore throat, or other symptoms of a cold or flu. Do not treat yourself. This drug may decrease your body's ability to fight infection. Try to avoid being around people who are sick. You should make sure you get enough calcium and vitamin D while you are taking this medicine, unless your doctor tells you not to. Discuss the foods you eat and the vitamins you take with your health care professional. See your dentist regularly. Brush and floss your teeth as directed. Before you have any dental work done, tell your dentist you are receiving this medicine. Do not become pregnant while taking this medicine or for 5 months after stopping  it. Talk with your doctor or health care professional about your birth control options while taking this medicine. Women should inform their doctor if they wish to become pregnant or think they might be pregnant. There is a potential for serious side effects to an unborn child. Talk  to your health care professional or pharmacist for more information. What side effects may I notice from receiving this medicine? Side effects that you should report to your doctor or health care professional as soon as possible: -allergic reactions like skin rash, itching or hives, swelling of the face, lips, or tongue -bone pain -breathing problems -dizziness -jaw pain, especially after dental work -redness, blistering, peeling of the skin -signs and symptoms of infection like fever or chills; cough; sore throat; pain or trouble passing urine -signs of low calcium like fast heartbeat, muscle cramps or muscle pain; pain, tingling, numbness in the hands or feet; seizures -unusual bleeding or bruising -unusually weak or tired Side effects that usually do not require medical attention (report to your doctor or health care professional if they continue or are bothersome): -constipation -diarrhea -headache -joint pain -loss of appetite -muscle pain -runny nose -tiredness -upset stomach This list may not describe all possible side effects. Call your doctor for medical advice about side effects. You may report side effects to FDA at 1-800-FDA-1088. Where should I keep my medicine? This medicine is only given in a clinic, doctor's office, or other health care setting and will not be stored at home. NOTE: This sheet is a summary. It may not cover all possible information. If you have questions about this medicine, talk to your doctor, pharmacist, or health care provider.  2018 [employer] (2016-06-03 19:17:21)   Fulvestrant injection What is this medicine? FULVESTRANT (ful VES trant) blocks the effects of estrogen. It is used to treat breast cancer. This medicine may be used for other purposes; ask your health care provider or pharmacist if you have questions. COMMON BRAND NAME(S): FASLODEX What should I tell my health care provider before I take this medicine? They need to  know if you have any of these conditions: -bleeding problems -liver disease -low levels of platelets in the blood -an unusual or allergic reaction to fulvestrant, other medicines, foods, dyes, or preservatives -pregnant or trying to get pregnant -breast-feeding How should I use this medicine? This medicine is for injection into a muscle. It is usually given by a health care professional in a hospital or clinic setting. Talk to your pediatrician regarding the use of this medicine in children. Special care may be needed. Overdosage: If you think you have taken too much of this medicine contact a poison control center or emergency room at once. NOTE: This medicine is only for you. Do not share this medicine with others. What if I miss a dose? It is important not to miss your dose. Call your doctor or health care professional if you are unable to keep an appointment. What may interact with this medicine? -medicines that treat or prevent blood clots like warfarin, enoxaparin, and dalteparin This list may not describe all possible interactions. Give your health care provider a list of all the medicines, herbs, non-prescription drugs, or dietary supplements you use. Also tell them if you smoke, drink alcohol, or use illegal drugs. Some items may interact with your medicine. What should I watch for while using this medicine? Your condition will be monitored carefully while you are receiving this medicine. You will need important blood work done while you   this medicine. Do not become pregnant while taking this medicine or for at least 1 year after stopping it. Women of child-bearing potential will need to have a negative pregnancy test before starting this medicine. Women should inform their doctor if they wish to become pregnant or think they might be pregnant. There is a potential for serious side effects to an unborn child. Men should inform their doctors if they wish to father a child. This  medicine may lower sperm counts. Talk to your health care professional or pharmacist for more information. Do not breast-feed an infant while taking this medicine or for 1 year after the last dose. What side effects may I notice from receiving this medicine? Side effects that you should report to your doctor or health care professional as soon as possible: -allergic reactions like skin rash, itching or hives, swelling of the face, lips, or tongue -feeling faint or lightheaded, falls -pain, tingling, numbness, or weakness in the legs -signs and symptoms of infection like fever or chills; cough; flu-like symptoms; sore throat -vaginal bleeding Side effects that usually do not require medical attention (report to your doctor or health care professional if they continue or are bothersome): -aches, pains -constipation -diarrhea -headache -hot flashes -nausea, vomiting -pain at site where injected -stomach pain This list may not describe all possible side effects. Call your doctor for medical advice about side effects. You may report side effects to FDA at 1-800-FDA-1088. Where should I keep my medicine? This drug is given in a hospital or clinic and will not be stored at home. NOTE: This sheet is a summary. It may not cover all possible information. If you have questions about this medicine, talk to your doctor, pharmacist, or health care provider.  2018 [employer] Standard (2014-12-08 11:03:55)

## 2017-06-27 LAB — PTH, INTACT AND CALCIUM
CALCIUM TOTAL (PTH): 10.7 mg/dL — AB (ref 8.7–10.3)
PTH: 29 pg/mL (ref 15–65)

## 2017-07-03 LAB — PTH-RELATED PEPTIDE

## 2017-07-06 ENCOUNTER — Other Ambulatory Visit: Payer: Self-pay | Admitting: Oncology

## 2017-07-06 DIAGNOSIS — C7951 Secondary malignant neoplasm of bone: Secondary | ICD-10-CM

## 2017-07-22 NOTE — Progress Notes (Signed)
April Rosales  [phone] [phone]   ID: April Rosales DOB: August 05, 1940  MR#: [id number]  DJM#:[id number]  Patient Care Team: [name], MD as PCP - General (Internal Medicine) Magrinat, [name], MD as Consulting Physician (Oncology) [name], MD as Consulting Physician (Orthopedic Surgery) [name], MD as Consulting Physician (General Surgery) PCP: [name], MD  CHIEF COMPLAINT: Stage IV estrogen receptor positive breast cancer  CURRENT TREATMENT:   Fulvestrant, palbociclib; denosumab/Xgeva  INTERVAL HISTORY: April Rosales returns today for follow-up and treatment of her estrogen receptor positive breast cancer accompanied by her friend. She continues on palbociclib, with good tolerance. She denies having issues with nausea and fatigue. This is her week off in the cycle. She obtains this medication at a low cost.   She also receives denosumab/Xgeva, currently every 8 weeks, most recent dose 06/26/2017.Marland Kitchen   She was started on fulvestrant in January.  And she tolerates this well. She denies hot flashes. She might feel some pain after the shot during the day.   We had referred her to surgery, specifically Dr. Dalbert Batman, to discuss lumpectomy, but she canceled that, preferring to go through the current treatment and see if it controls the cancer.  REVIEW OF SYSTEMS: April Rosales reports she notes that she hasn't been doing much of anything. She notes that she canceled her appointment with Dr. Dalbert Batman to discuss having a lumpectomy. She denies unusual headaches, visual changes, nausea, vomiting, or dizziness. There has been no unusual cough, phlegm production, or pleurisy. This been no change in bowel or bladder habits. She denies unexplained fatigue or unexplained weight loss, bleeding, rash, or fever. A detailed review of systems was otherwise stable.    BREAST CANCER HISTORY: From the original intake note:   April Rosales underwent right  lumpectomy in 1985 at [facility] for what sounds like a stage I breast cancer. She tells me she had more than 30 lymph nodes removed from her right axilla and all of them were clear. She received  adjuvant radiation but no systemic treatment.  The patient had recently refused mammography with the last mammogram I can find dating back to August 2010.  More recently the patient presented with left scapular pain radiating down the left arm.  She was evaluated by Dr. [name], who obtained a chest x-ray showing a possible abnormality at T3. He then set up the patient for an MRI of the thoracic spine performed 08/16/2014.  This showed multiple compression fractures  (a similar picture had been noted on lumbar MRI 10/09/2011 ). However at T3 they noted tumor in the vertebral body extending into the left pedicle and into the lateral epidural space, displacing the cord to the right. There was no evidence of cord compression or cord signal abnormality. There were no other areas of tumor identified in the thoracic spine.         The patient was then referred to Dr. Hal Neer who on 08/24/2014 set April Rosales up for CT scans of the chest, abdomen and pelvis. There was a dense mass in the upper inner quadrant of the left breast measuring 1.6 cm. There was a 1.2 cm nodule in the minor fissure of the right lung and some evidence of right lung fibrosis at the site of the prior radiation port.  There was also a thyroid mass measuring 2 cm. However there were no parenchymal lung or liver lesions. Incidental meningoceles were noted as well as sclerosis of the fifth and sixth ribs which were  felt to be likely posttraumatic.   On 08/29/2014 the patient underwent bilateral diagnostic mammography with tomosynthesis and left breast ultrasonography.  There were postsurgical changes in the upper right breast. In the left breast there was an irregular mass measuring 2.3 cm in the upper inner quadrant. This was palpable. Ultrasound  showed this to be hypoechoic and to measure 2.0 cm. There were adjacent areas of nodularity. There was no definite lymphadenopathy in the left axilla.  Biopsy of this breast mass 08/29/2014 showed an invasive adenocarcinoma with both ductal and lobular features (there was strong diffuse E-cadherin expression as well as areas with total absence of E-cadherin expression), with the preliminary prognostic profile showing strong estrogen positivity, very weak to near absent progesterone positivity, an MIB-1 of approximately 40%, and HER-2 equivocal  The patient's subsequent history is as detailed below.   PAST MEDICAL HISTORY: Past Medical History:  Diagnosis Date  . Arthritis   . Breast cancer [facility]) 1985/2016   takes [name] daily  . Chronic back pain    stenosis/listhesis  . Family history of ovarian cancer   . Osteoporosis    takes Vit D  . Radiation 11/23/14-12/11/14   30 Gy T1-T5     PAST SURGICAL HISTORY: Past Surgical History:  Procedure Laterality Date  . ABDOMINAL HYSTERECTOMY     1980  . APPENDECTOMY  1977  . APPLICATION OF INTRAOPERATIVE CT SCAN N/A 10/20/2014   Procedure: APPLICATION OF INTRAOPERATIVE CAT SCAN;  Surgeon: [name], MD;  Location: [facility];  Service: Neurosurgery;  Laterality: N/A;  . BREAST SURGERY Right 1985  . THYROID CYST EXCISION  1967    FAMILY HISTORY Family History  Problem Relation Age of Onset  . Heart disease Mother   . Hypertension Mother   . Heart disease Father   . Diabetes Father   . Breast cancer Paternal Aunt        4 paternal aunts with breast cancer over 78  . Prostate cancer Paternal Uncle   . Stroke Paternal Grandfather   . Ovarian cancer Paternal Aunt   . Huntington's disease Other        Nephew, inherited from his father   The patient's father died from a heart attack at the age of 53. He had 9 sisters. 3 of those sisters had breast cancer, all in a menopausal setting. Another sister had ovarian cancer. One of the  paternal uncles had cancer of the colon "and back".  The patient's mother died at the age of 64. She was found to have breast cancer shortly before dying , during her final hospitalization.  GYNECOLOGIC HISTORY:  No LMP recorded. Patient has had a hysterectomy.  Menarche age 57, first live birth age 63, the patient is GX P1. She underwent hysterectomy in 1980. She thinks the ovaries were removed, but the CT scan obtained 08/24/2014 showed a definite right ovary. The left ovary may have been removed. She did not take hormone replacement after the hysterectomy.  SOCIAL HISTORY:   Rosali worked in Psychiatric nurse. She is divorced, lives alone, with 2 cats. Her son Bethann Berkshire lives in [place] where he works in Engineer, technical sales. He has 4 children of his own. The patient is a Psychologist, forensic.    ADVANCED DIRECTIVES:  In place. The patient has named her sister Pleas Koch [phone]) and her friend Janina Mayo [phone]) as joint healthcare powers of attorney   HEALTH MAINTENANCE: Social History   Tobacco Use  . Smoking status: Former Smoker    Packs/day:  0.50    Years: 10.00    Pack years: 5.00    Types: Cigarettes  . Smokeless tobacco: Former Systems developer  . Tobacco comment: quit smoking 1970's  Substance Use Topics  . Alcohol use: Yes    Alcohol/week: 0.0 oz    Comment: Rare  . Drug use: No     Colonoscopy: never  PAP: status post hysterectomy  Bone density: 08/14/2014 at [facility], results pending  Lipid panel:  Allergies  Allergen Reactions  . Ativan [Lorazepam]     Made her crazy  . Dilaudid [Hydromorphone Hcl]     Doesn't want  . Haldol [Haloperidol Lactate]     Made her crazy    Current Outpatient Medications  Medication Sig Dispense Refill  . Ascorbic Acid (VITAMIN C PO) Take 1 tablet by mouth every other day. Takes qod    . aspirin EC 325 MG tablet Take 1 tablet (325 mg total) by mouth daily. 30 tablet 0  . Cholecalciferol (VITAMIN D3 ADULT GUMMIES PO) Take 4,000 Units by  mouth daily.    . Cyanocobalamin (VITAMIN B-12 PO) Take by mouth daily. Takes every other day    . gabapentin (NEURONTIN) 100 MG capsule Take 1 capsule (100 mg total) by mouth at bedtime. 90 capsule 4  . gabapentin (NEURONTIN) 100 MG capsule TAKE 1-3 CAPSULES BY MOUTH ONCE DURING THE DAYTIME AS NEEDED FOR PAIN(CONTINUE TAKING 300MG SCRIPT AT BEDTIME) 90 capsule 0  . palbociclib (IBRANCE) 75 MG capsule Take 1 tablet with food every other day, 3 weeks on, one week off 11 capsule 6   No current facility-administered medications for this visit.     OBJECTIVE:  Older white woman who appears stated age  65:   07/24/17 1010  BP: 124/63  Pulse: 79  Resp: 18  Temp: 97.8 F (36.6 C)  SpO2: 98%     Body mass index is 22.86 kg/m.    ECOG FS:1 - Symptomatic but completely ambulatory  Sclerae unicteric, EOMs intact Oropharynx clear and moist No cervical or supraclavicular adenopathy Lungs no rales or rhonchi Heart regular rate and rhythm Abd soft, nontender, positive bowel sounds MSK significant kyphosis but no focal spinal tenderness, no upper extremity lymphedema Neuro: nonfocal, well oriented, appropriate affect Breasts: The right breast is status post remote lumpectomy.  There is no evidence of local recurrence.  I do not palpate a well-defined mass in the left breast.  Both axillae are benign  LAB RESULTS:  CMP     Component Value Date/Time   NA 139 07/24/2017 0952   NA 141 05/29/2017 1417   K 4.3 07/24/2017 0952   K 3.6 05/29/2017 1417   CL 104 07/24/2017 0952   CO2 29 07/24/2017 0952   CO2 27 05/29/2017 1417   GLUCOSE 74 07/24/2017 0952   GLUCOSE 111 05/29/2017 1417   BUN 14 07/24/2017 0952   BUN 17.0 05/29/2017 1417   CREATININE 0.72 07/24/2017 0952   CREATININE 0.8 05/29/2017 1417   CALCIUM 10.2 07/24/2017 0952   CALCIUM 10.7 (H) 06/26/2017 1216   CALCIUM 10.4 05/29/2017 1417   PROT 6.9 07/24/2017 0952   PROT 7.3 05/29/2017 1417   ALBUMIN 3.6 07/24/2017 0952    ALBUMIN 3.9 05/29/2017 1417   AST 17 07/24/2017 0952   AST 17 05/29/2017 1417   ALT 14 07/24/2017 0952   ALT 15 05/29/2017 1417   ALKPHOS 37 (L) 07/24/2017 0952   ALKPHOS 42 05/29/2017 1417   BILITOT 0.3 07/24/2017 0952   BILITOT 0.30 05/29/2017  [place] >60 07/24/2017 0952   GFRNONAA 85 07/07/2016 1125   GFRAA >60 07/24/2017 0952   GFRAA >89 07/07/2016 1125    INo results found for: SPEP, UPEP  Lab Results  Component Value Date   WBC 2.6 (L) 07/24/2017   NEUTROABS 1.3 (L) 07/24/2017   HGB 14.1 07/24/2017   HCT 41.4 07/24/2017   MCV 93.9 07/24/2017   PLT 183 07/24/2017      Chemistry      Component Value Date/Time   NA 139 07/24/2017 0952   NA 141 05/29/2017 1417   K 4.3 07/24/2017 0952   K 3.6 05/29/2017 1417   CL 104 07/24/2017 0952   CO2 29 07/24/2017 0952   CO2 27 05/29/2017 1417   BUN 14 07/24/2017 0952   BUN 17.0 05/29/2017 1417   CREATININE 0.72 07/24/2017 0952   CREATININE 0.8 05/29/2017 1417      Component Value Date/Time   CALCIUM 10.2 07/24/2017 0952   CALCIUM 10.7 (H) 06/26/2017 1216   CALCIUM 10.4 05/29/2017 1417   ALKPHOS 37 (L) 07/24/2017 0952   ALKPHOS 42 05/29/2017 1417   AST 17 07/24/2017 0952   AST 17 05/29/2017 1417   ALT 14 07/24/2017 0952   ALT 15 05/29/2017 1417   BILITOT 0.3 07/24/2017 0952   BILITOT 0.30 05/29/2017 1417       Lab Results  Component Value Date   LABCA2 24 01/01/2016    No components found for: [id number]  No results for input(s): INR in the last 168 hours.  Urinalysis    Component Value Date/Time   COLORURINE YELLOW 09/19/2015 1239   APPEARANCEUR CLOUDY (A) 09/19/2015 1239   LABSPEC 1.015 09/19/2015 1239   PHURINE 7.5 09/19/2015 1239   GLUCOSEU NEGATIVE 09/19/2015 1239   HGBUR NEGATIVE 09/19/2015 1239   BILIRUBINUR NEGATIVE 09/19/2015 1239   KETONESUR NEGATIVE 09/19/2015 1239   PROTEINUR NEGATIVE 09/19/2015 1239   NITRITE NEGATIVE 09/19/2015 1239   LEUKOCYTESUR NEGATIVE 09/19/2015 1239     STUDIES: Recent left breast ultrasonography reviewed with the patient  ASSESSMENT: 77 y.o. [place] woman with stage IV left breast cancer involving bone  (1) status post right lumpectomy and axillary node dissection in 1985 followed by radiation at [facility] 08/16/2014: measurable disease in spine, lung and left breast   (2) evaluation for left shoulder pain led to thoracic spine MRI 08/16/2014 showing a pathologic fracture at T3 with epidural tumor displacing the cord to the right, but no cord compression. CT scans of the chest, abdomen and pelvis 08/24/2014 showed in addition a mass in the upper outer quadrant left breast measuring 1.6 cm and a nodule in the minor fissure of the right lung measuring 1.2 cm, but no parenchymal lung or liver lesions.   (a) CA 27-29 was noninformative at 38 (09/20/2014)    (3) mammography and ultrasonography 08/29/2014 show a mass in the upper inner left breast which was palpable,  measuring 2.0 cm by ultrasound. Biopsy of this mass 08/29/2014 showed an invasive breast cancer with both lobular and ductal features, estrogen receptor positive, progesterone receptor weakly positive, with an MIB-1 in the 40% range, HER-2 equivocal (6 else ratio 1.5, but average number her nucleus 5.8)    (4) letrozole started 08/31/2014;   (a) palbociclib added Sept 2016 at 75 mg 21/7, with significant neutropenia; not repeated after first cycle  (b) letrozole discontinued 05/29/2017 with evidence of disease progression in the breast   (5)  zolendronate started 09/20/2014, stopped after  initial dose due to poor tolerance  (a) denosumab/ Xgeva started 01/31/2015, repeated every 4 weeks  (b) changed to every 8 weeks as of August 2018.    (6) on 10/20/2014 the patient underwent T2-T3 and T4 decompressive laminectomy with removal of epidural tumor, C7-T4 segmental pedicle screw instrumentation with virage screw system with arrow guidance protocol and C7-T4  posterolateral fusion. The cells were positive for the estrogen receptor. HER-2/neu testing by [facility] showed again equivocal results,  (a) most recent bone scan 10/01/2016 finds no new lesions only postoperative changes  (7) radiation 11/23/2014-12/11/2014.  (a) T1-T5 was treated to 30 Gy in 12 fractions at 2.5 Gy per fraction   (8)  consider eventual left lumpectomy or mastectomy depending on  longer-term results of systemic therapy  (a) most recent left breast ultrasonography 10/30/2016 found this mass to measure 0.7 cm  (b) repeat left breast ultrasonography 05/13/2017 showed the upper outer quadrant mass to have grown to 1.4 cm  (9) genetics testing using the Breast/Ovarian Cancer Panel through [facility] [name], MD) found no deleterious mutations in ATM, BARD1, BRCA1, BRCA2, BRIP1, CDH1, CHEK2, EPCAM, FANCC, MLH1, MSH2, MSH6, NBN, PALB2, PMS2, PTEN, RAD51C, RAD51D, STK11, TP53, or XRCC2    (10) right thyroid nodule is a complex cyst as noted on CT scan of the neck 01/29/2016  (11) osteoporosis: Bone density at [facility] 09/12/2016 showed a T score of -4.8.  (a) continue denosumab/Xgeva as above  (12) fulvestrant started 05/29/2017  (a) started palbociclib 06/29/2017 at 75 mg every other day for 21 days on, 7 days off  (13) considering left lumpectomy  PLAN: I spent approximately 30 minutes with April Rosales with most of that time spent discussing her questions and situations.  She is tolerating the palbociclib well.  She is at a very minimal dose.  Nevertheless the INR is 1.3 and I do not think we would have a lot of room they are to increase the dose.  She was not sure whether she takes 10 pills per cycle or 11 pills per cycle.  The answer is 11 pills.  She did not count how many pills she had left.  I am going to get our oral chemotherapy pharmacist specialist to give her a call and review things with her and make sure she does not run out of pills.  She is to start her second cycle  07/27/2017  She is tolerating the fulvestrant well.  She is receiving the denosumab every 2 weeks.  She will need a dose 4 weeks from now.  She canceled her surgical appointment.  I am setting her up for repeat ultrasound of the left breast mid April, before she returns to see me 8 weeks from now.  If there has been any further growth of her left breast mass I will again urged her to undergo surgery  She knows to call for any other issues that may develop before the next visit.  Magrinat, [name], MD  07/24/17 10:46 AM Medical Oncology and Hematology [facility] [address] [place], [place] [address] Tel. [phone]    Fax. [phone]   This document serves as a record of services personally performed by Lurline Del, MD. It was created on his behalf by Sheron Nightingale, a trained medical scribe. The creation of this record is based on the scribe's personal observations and the provider's statements to them.   I have reviewed the above documentation for accuracy and completeness, and I agree with the above.

## 2017-07-24 ENCOUNTER — Inpatient Hospital Stay: Payer: Medicare Other | Attending: Oncology | Admitting: Oncology

## 2017-07-24 ENCOUNTER — Telehealth: Payer: Self-pay | Admitting: Oncology

## 2017-07-24 ENCOUNTER — Inpatient Hospital Stay: Payer: Medicare Other

## 2017-07-24 VITALS — BP 124/63 | HR 79 | Temp 97.8°F | Resp 18 | Ht 61.5 in | Wt 123.0 lb

## 2017-07-24 DIAGNOSIS — C50912 Malignant neoplasm of unspecified site of left female breast: Secondary | ICD-10-CM

## 2017-07-24 DIAGNOSIS — Z170 Estrogen receptor positive status [ER+]: Secondary | ICD-10-CM | POA: Diagnosis not present

## 2017-07-24 DIAGNOSIS — Z5111 Encounter for antineoplastic chemotherapy: Secondary | ICD-10-CM | POA: Insufficient documentation

## 2017-07-24 DIAGNOSIS — C7951 Secondary malignant neoplasm of bone: Secondary | ICD-10-CM | POA: Insufficient documentation

## 2017-07-24 DIAGNOSIS — C50919 Malignant neoplasm of unspecified site of unspecified female breast: Secondary | ICD-10-CM

## 2017-07-24 DIAGNOSIS — M810 Age-related osteoporosis without current pathological fracture: Secondary | ICD-10-CM

## 2017-07-24 DIAGNOSIS — C50412 Malignant neoplasm of upper-outer quadrant of left female breast: Secondary | ICD-10-CM | POA: Diagnosis not present

## 2017-07-24 LAB — COMPREHENSIVE METABOLIC PANEL
ALK PHOS: 37 U/L — AB (ref 40–150)
ALT: 14 U/L (ref 0–55)
AST: 17 U/L (ref 5–34)
Albumin: 3.6 g/dL (ref 3.5–5.0)
Anion gap: 6 (ref 3–11)
BILIRUBIN TOTAL: 0.3 mg/dL (ref 0.2–1.2)
BUN: 14 mg/dL (ref 7–26)
CALCIUM: 10.2 mg/dL (ref 8.4–10.4)
CO2: 29 mmol/L (ref 22–29)
CREATININE: 0.72 mg/dL (ref 0.60–1.10)
Chloride: 104 mmol/L (ref 98–109)
Glucose, Bld: 74 mg/dL (ref 70–140)
Potassium: 4.3 mmol/L (ref 3.5–5.1)
SODIUM: 139 mmol/L (ref 136–145)
Total Protein: 6.9 g/dL (ref 6.4–8.3)

## 2017-07-24 LAB — CBC WITH DIFFERENTIAL/PLATELET
Basophils Absolute: 0 10*3/uL (ref 0.0–0.1)
Basophils Relative: 1 %
Eosinophils Absolute: 0.1 10*3/uL (ref 0.0–0.5)
Eosinophils Relative: 3 %
HCT: 41.4 % (ref 34.8–46.6)
HEMOGLOBIN: 14.1 g/dL (ref 11.6–15.9)
LYMPHS ABS: 0.8 10*3/uL — AB (ref 0.9–3.3)
LYMPHS PCT: 32 %
MCH: 32 pg (ref 25.1–34.0)
MCHC: 34.1 g/dL (ref 31.5–36.0)
MCV: 93.9 fL (ref 79.5–101.0)
Monocytes Absolute: 0.4 10*3/uL (ref 0.1–0.9)
Monocytes Relative: 14 %
NEUTROS ABS: 1.3 10*3/uL — AB (ref 1.5–6.5)
NEUTROS PCT: 50 %
Platelets: 183 10*3/uL (ref 145–400)
RBC: 4.41 MIL/uL (ref 3.70–5.45)
RDW: 14.8 % — ABNORMAL HIGH (ref 11.2–14.5)
WBC: 2.6 10*3/uL — AB (ref 3.9–10.3)

## 2017-07-24 MED ORDER — DENOSUMAB 120 MG/1.7ML ~~LOC~~ SOLN: 120.0000 mg | Freq: Once | SUBCUTANEOUS | Status: DC

## 2017-07-24 MED ORDER — DENOSUMAB 120 MG/1.7ML ~~LOC~~ SOLN
SUBCUTANEOUS | Status: AC
  Filled 2017-07-24: qty 1.7

## 2017-07-24 MED ORDER — FULVESTRANT 250 MG/5ML IM SOLN
500.0000 mg | Freq: Once | INTRAMUSCULAR | Status: AC
  Administered 2017-07-24: 500 mg via INTRAMUSCULAR

## 2017-07-24 MED ORDER — FULVESTRANT 250 MG/5ML IM SOLN
INTRAMUSCULAR | Status: AC
  Filled 2017-07-24: qty 5

## 2017-07-24 NOTE — Telephone Encounter (Signed)
Gave avs and calendar for march and april °

## 2017-08-11 ENCOUNTER — Ambulatory Visit: Payer: Medicare Other | Admitting: Oncology

## 2017-08-11 ENCOUNTER — Other Ambulatory Visit: Payer: Medicare Other

## 2017-08-11 ENCOUNTER — Ambulatory Visit: Payer: Medicare Other

## 2017-08-21 ENCOUNTER — Inpatient Hospital Stay: Payer: Medicare Other

## 2017-08-21 ENCOUNTER — Encounter: Payer: Self-pay | Admitting: Adult Health

## 2017-08-21 ENCOUNTER — Ambulatory Visit: Payer: Medicare Other

## 2017-08-21 ENCOUNTER — Telehealth: Payer: Self-pay | Admitting: Adult Health

## 2017-08-21 ENCOUNTER — Inpatient Hospital Stay (HOSPITAL_BASED_OUTPATIENT_CLINIC_OR_DEPARTMENT_OTHER): Payer: Medicare Other | Admitting: Adult Health

## 2017-08-21 VITALS — BP 119/71 | HR 86 | Temp 98.0°F | Resp 17 | Ht 61.5 in | Wt 124.0 lb

## 2017-08-21 DIAGNOSIS — M810 Age-related osteoporosis without current pathological fracture: Secondary | ICD-10-CM

## 2017-08-21 DIAGNOSIS — C50912 Malignant neoplasm of unspecified site of left female breast: Secondary | ICD-10-CM

## 2017-08-21 DIAGNOSIS — R5383 Other fatigue: Secondary | ICD-10-CM | POA: Diagnosis not present

## 2017-08-21 DIAGNOSIS — C7951 Secondary malignant neoplasm of bone: Secondary | ICD-10-CM | POA: Diagnosis not present

## 2017-08-21 DIAGNOSIS — C50412 Malignant neoplasm of upper-outer quadrant of left female breast: Secondary | ICD-10-CM | POA: Diagnosis not present

## 2017-08-21 DIAGNOSIS — Z5111 Encounter for antineoplastic chemotherapy: Secondary | ICD-10-CM | POA: Diagnosis not present

## 2017-08-21 DIAGNOSIS — Z170 Estrogen receptor positive status [ER+]: Secondary | ICD-10-CM | POA: Diagnosis not present

## 2017-08-21 DIAGNOSIS — C50919 Malignant neoplasm of unspecified site of unspecified female breast: Secondary | ICD-10-CM

## 2017-08-21 LAB — COMPREHENSIVE METABOLIC PANEL
ALBUMIN: 3.5 g/dL (ref 3.5–5.0)
ALT: 12 U/L (ref 0–55)
AST: 17 U/L (ref 5–34)
Alkaline Phosphatase: 40 U/L (ref 40–150)
Anion gap: 7 (ref 3–11)
BUN: 17 mg/dL (ref 7–26)
CHLORIDE: 104 mmol/L (ref 98–109)
CO2: 29 mmol/L (ref 22–29)
CREATININE: 0.72 mg/dL (ref 0.60–1.10)
Calcium: 10.7 mg/dL — ABNORMAL HIGH (ref 8.4–10.4)
GFR calc Af Amer: >60 mL/min (ref >60)
GFR calc non Af Amer: >60 mL/min (ref >60)
GLUCOSE: 80 mg/dL (ref 70–140)
Potassium: 4.8 mmol/L (ref 3.5–5.1)
SODIUM: 140 mmol/L (ref 136–145)
Total Bilirubin: 0.3 mg/dL (ref 0.2–1.2)
Total Protein: 6.9 g/dL (ref 6.4–8.3)

## 2017-08-21 LAB — CBC WITH DIFFERENTIAL/PLATELET
Basophils Absolute: 0.1 10*3/uL (ref 0.0–0.1)
Basophils Relative: 2 %
EOS ABS: 0.1 10*3/uL (ref 0.0–0.5)
EOS PCT: 4 %
HCT: 41.1 % (ref 34.8–46.6)
HEMOGLOBIN: 14.1 g/dL (ref 11.6–15.9)
LYMPHS ABS: 1.1 10*3/uL (ref 0.9–3.3)
LYMPHS PCT: 33 %
MCH: 32.3 pg (ref 25.1–34.0)
MCHC: 34.3 g/dL (ref 31.5–36.0)
MCV: 94.1 fL (ref 79.5–101.0)
MONOS PCT: 8 %
Monocytes Absolute: 0.3 10*3/uL (ref 0.1–0.9)
Neutro Abs: 1.8 10*3/uL (ref 1.5–6.5)
Neutrophils Relative %: 53 %
PLATELETS: 186 10*3/uL (ref 145–400)
RBC: 4.37 MIL/uL (ref 3.70–5.45)
RDW: 15.2 % — ABNORMAL HIGH (ref 11.2–14.5)
WBC: 3.4 10*3/uL — ABNORMAL LOW (ref 3.9–10.3)

## 2017-08-21 MED ORDER — DENOSUMAB 120 MG/1.7ML ~~LOC~~ SOLN
SUBCUTANEOUS | Status: AC
  Filled 2017-08-21: qty 1.7

## 2017-08-21 MED ORDER — DENOSUMAB 120 MG/1.7ML ~~LOC~~ SOLN
120.0000 mg | Freq: Once | SUBCUTANEOUS | Status: AC
  Administered 2017-08-21: 120 mg via SUBCUTANEOUS

## 2017-08-21 MED ORDER — FULVESTRANT 250 MG/5ML IM SOLN
INTRAMUSCULAR | Status: AC
  Filled 2017-08-21: qty 10

## 2017-08-21 MED ORDER — FULVESTRANT 250 MG/5ML IM SOLN
500.0000 mg | Freq: Once | INTRAMUSCULAR | Status: AC
  Administered 2017-08-21: 500 mg via INTRAMUSCULAR

## 2017-08-21 NOTE — Patient Instructions (Signed)
Denosumab injection What is this medicine? DENOSUMAB (den oh sue mab) slows bone breakdown. Prolia is used to treat osteoporosis in women after menopause and in men. Delton See is used to treat a high calcium level due to cancer and to prevent bone fractures and other bone problems caused by multiple myeloma or cancer bone metastases. Delton See is also used to treat giant cell tumor of the bone. This medicine may be used for other purposes; ask your health care provider or pharmacist if you have questions. COMMON BRAND NAME(S): Prolia, XGEVA What should I tell my health care provider before I take this medicine? They need to know if you have any of these conditions: -dental disease -having surgery or tooth extraction -infection -kidney disease -low levels of calcium or Vitamin D in the blood -malnutrition -on hemodialysis -skin conditions or sensitivity -thyroid or parathyroid disease -an unusual reaction to denosumab, other medicines, foods, dyes, or preservatives -pregnant or trying to get pregnant -breast-feeding How should I use this medicine? This medicine is for injection under the skin. It is given by a health care professional in a hospital or clinic setting. If you are getting Prolia, a special MedGuide will be given to you by the pharmacist with each prescription and refill. Be sure to read this information carefully each time. For Prolia, talk to your pediatrician regarding the use of this medicine in children. Special care may be needed. For Delton See, talk to your pediatrician regarding the use of this medicine in children. While this drug may be prescribed for children as young as 13 years for selected conditions, precautions do apply. Overdosage: If you think you have taken too much of this medicine contact a poison control center or emergency room at once. NOTE: This medicine is only for you. Do not share this medicine with others. What if I miss a dose? It is important not to miss your  dose. Call your doctor or health care professional if you are unable to keep an appointment. What may interact with this medicine? Do not take this medicine with any of the following medications: -other medicines containing denosumab This medicine may also interact with the following medications: -medicines that lower your chance of fighting infection -steroid medicines like prednisone or cortisone This list may not describe all possible interactions. Give your health care provider a list of all the medicines, herbs, non-prescription drugs, or dietary supplements you use. Also tell them if you smoke, drink alcohol, or use illegal drugs. Some items may interact with your medicine. What should I watch for while using this medicine? Visit your doctor or health care professional for regular checks on your progress. Your doctor or health care professional may order blood tests and other tests to see how you are doing. Call your doctor or health care professional for advice if you get a fever, chills or sore throat, or other symptoms of a cold or flu. Do not treat yourself. This drug may decrease your body's ability to fight infection. Try to avoid being around people who are sick. You should make sure you get enough calcium and vitamin D while you are taking this medicine, unless your doctor tells you not to. Discuss the foods you eat and the vitamins you take with your health care professional. See your dentist regularly. Brush and floss your teeth as directed. Before you have any dental work done, tell your dentist you are receiving this medicine. Do not become pregnant while taking this medicine or for 5 months after stopping  it. Talk with your doctor or health care professional about your birth control options while taking this medicine. Women should inform their doctor if they wish to become pregnant or think they might be pregnant. There is a potential for serious side effects to an unborn child. Talk  to your health care professional or pharmacist for more information. What side effects may I notice from receiving this medicine? Side effects that you should report to your doctor or health care professional as soon as possible: -allergic reactions like skin rash, itching or hives, swelling of the face, lips, or tongue -bone pain -breathing problems -dizziness -jaw pain, especially after dental work -redness, blistering, peeling of the skin -signs and symptoms of infection like fever or chills; cough; sore throat; pain or trouble passing urine -signs of low calcium like fast heartbeat, muscle cramps or muscle pain; pain, tingling, numbness in the hands or feet; seizures -unusual bleeding or bruising -unusually weak or tired Side effects that usually do not require medical attention (report to your doctor or health care professional if they continue or are bothersome): -constipation -diarrhea -headache -joint pain -loss of appetite -muscle pain -runny nose -tiredness -upset stomach This list may not describe all possible side effects. Call your doctor for medical advice about side effects. You may report side effects to FDA at 1-800-FDA-1088. Where should I keep my medicine? This medicine is only given in a clinic, doctor's office, or other health care setting and will not be stored at home. NOTE: This sheet is a summary. It may not cover all possible information. If you have questions about this medicine, talk to your doctor, pharmacist, or health care provider.  2018 [employer] (2016-06-03 19:17:21)   Fulvestrant injection What is this medicine? FULVESTRANT (ful VES trant) blocks the effects of estrogen. It is used to treat breast cancer. This medicine may be used for other purposes; ask your health care provider or pharmacist if you have questions. COMMON BRAND NAME(S): FASLODEX What should I tell my health care provider before I take this medicine? They need to  know if you have any of these conditions: -bleeding problems -liver disease -low levels of platelets in the blood -an unusual or allergic reaction to fulvestrant, other medicines, foods, dyes, or preservatives -pregnant or trying to get pregnant -breast-feeding How should I use this medicine? This medicine is for injection into a muscle. It is usually given by a health care professional in a hospital or clinic setting. Talk to your pediatrician regarding the use of this medicine in children. Special care may be needed. Overdosage: If you think you have taken too much of this medicine contact a poison control center or emergency room at once. NOTE: This medicine is only for you. Do not share this medicine with others. What if I miss a dose? It is important not to miss your dose. Call your doctor or health care professional if you are unable to keep an appointment. What may interact with this medicine? -medicines that treat or prevent blood clots like warfarin, enoxaparin, and dalteparin This list may not describe all possible interactions. Give your health care provider a list of all the medicines, herbs, non-prescription drugs, or dietary supplements you use. Also tell them if you smoke, drink alcohol, or use illegal drugs. Some items may interact with your medicine. What should I watch for while using this medicine? Your condition will be monitored carefully while you are receiving this medicine. You will need important blood work done while you   this medicine. Do not become pregnant while taking this medicine or for at least 1 year after stopping it. Women of child-bearing potential will need to have a negative pregnancy test before starting this medicine. Women should inform their doctor if they wish to become pregnant or think they might be pregnant. There is a potential for serious side effects to an unborn child. Men should inform their doctors if they wish to father a child. This  medicine may lower sperm counts. Talk to your health care professional or pharmacist for more information. Do not breast-feed an infant while taking this medicine or for 1 year after the last dose. What side effects may I notice from receiving this medicine? Side effects that you should report to your doctor or health care professional as soon as possible: -allergic reactions like skin rash, itching or hives, swelling of the face, lips, or tongue -feeling faint or lightheaded, falls -pain, tingling, numbness, or weakness in the legs -signs and symptoms of infection like fever or chills; cough; flu-like symptoms; sore throat -vaginal bleeding Side effects that usually do not require medical attention (report to your doctor or health care professional if they continue or are bothersome): -aches, pains -constipation -diarrhea -headache -hot flashes -nausea, vomiting -pain at site where injected -stomach pain This list may not describe all possible side effects. Call your doctor for medical advice about side effects. You may report side effects to FDA at 1-800-FDA-1088. Where should I keep my medicine? This drug is given in a hospital or clinic and will not be stored at home. NOTE: This sheet is a summary. It may not cover all possible information. If you have questions about this medicine, talk to your doctor, pharmacist, or health care provider.  2018 [employer] Standard (2014-12-08 11:03:55)

## 2017-08-21 NOTE — Telephone Encounter (Signed)
Per 3/29 no los 

## 2017-08-21 NOTE — Progress Notes (Signed)
[place]  [phone] [phone]   ID: April Rosales DOB: Jun 03, 1940  MR#: [id number]  ONG#:[id number]  Patient Care Team: [name], MD as PCP - General (Internal Medicine) Magrinat, [name], MD as Consulting Physician (Oncology) [name], MD as Consulting Physician (Orthopedic Surgery) [name], MD as Consulting Physician (General Surgery) PCP: [name], MD  CHIEF COMPLAINT: Stage IV estrogen receptor positive breast cancer  CURRENT TREATMENT:   Fulvestrant, palbociclib; denosumab/Xgeva  INTERVAL HISTORY: April Rosales returns today for follow-up and treatment of her estrogen receptor positive breast cancer accompanied by her friend. She has continued on Palbociclib, however does not want to take it this month due to progressive fatigue.  She says that she feels drained all the time, and never wants to do anything because her fatigue is so significant.  She wants to stop taking it until she has her ultrasound.  She also receives denosumab/Xgeva, currently every 8 weeks, most recent dose 06/26/2017, due today.   She was started on fulvestrant in January and receives this every 4 weeks.   REVIEW OF SYSTEMS: April Rosales is doing well other than her fatigue.  She denies fevers, chills, vision changes, headaches, weight loss, dysphagia, nausea, vomiting, constipation, diarrhea, chest pain, shortness of breath, cough, palpitations, or any further concerns.  A detailed ROS was non contributory today.     BREAST CANCER HISTORY: From the original intake note:   April Rosales underwent right lumpectomy in 1985 at [facility] for what sounds like a stage I breast cancer. She tells me she had more than 30 lymph nodes removed from her right axilla and all of them were clear. She received  adjuvant radiation but no systemic treatment.  The patient had recently refused mammography with the last mammogram I can find dating back to August  2010.  More recently the patient presented with left scapular pain radiating down the left arm.  She was evaluated by Dr. [name], who obtained a chest x-ray showing a possible abnormality at T3. He then set up the patient for an MRI of the thoracic spine performed 08/16/2014.  This showed multiple compression fractures  (a similar picture had been noted on lumbar MRI 10/09/2011 ). However at T3 they noted tumor in the vertebral body extending into the left pedicle and into the lateral epidural space, displacing the cord to the right. There was no evidence of cord compression or cord signal abnormality. There were no other areas of tumor identified in the thoracic spine.         The patient was then referred to Dr. Hal Neer who on 08/24/2014 set [name] up for CT scans of the chest, abdomen and pelvis. There was a dense mass in the upper inner quadrant of the left breast measuring 1.6 cm. There was a 1.2 cm nodule in the minor fissure of the right lung and some evidence of right lung fibrosis at the site of the prior radiation port.  There was also a thyroid mass measuring 2 cm. However there were no parenchymal lung or liver lesions. Incidental meningoceles were noted as well as sclerosis of the fifth and sixth ribs which were felt to be likely posttraumatic.   On 08/29/2014 the patient underwent bilateral diagnostic mammography with tomosynthesis and left breast ultrasonography.  There were postsurgical changes in the upper right breast. In the left breast there was an irregular mass measuring 2.3 cm in the upper inner quadrant. This was palpable. Ultrasound showed this to be hypoechoic  and to measure 2.0 cm. There were adjacent areas of nodularity. There was no definite lymphadenopathy in the left axilla.  Biopsy of this breast mass 08/29/2014 showed an invasive adenocarcinoma with both ductal and lobular features (there was strong diffuse E-cadherin expression as well as areas with total absence of  E-cadherin expression), with the preliminary prognostic profile showing strong estrogen positivity, very weak to near absent progesterone positivity, an MIB-1 of approximately 40%, and HER-2 equivocal  The patient's subsequent history is as detailed below.   PAST MEDICAL HISTORY: Past Medical History:  Diagnosis Date  . Arthritis   . Breast cancer [facility]) 1985/2016   takes [name] daily  . Chronic back pain    stenosis/listhesis  . Family history of ovarian cancer   . Osteoporosis    takes Vit D  . Radiation 11/23/14-12/11/14   30 Gy T1-T5     PAST SURGICAL HISTORY: Past Surgical History:  Procedure Laterality Date  . ABDOMINAL HYSTERECTOMY     1980  . APPENDECTOMY  1977  . APPLICATION OF INTRAOPERATIVE CT SCAN N/A 10/20/2014   Procedure: APPLICATION OF INTRAOPERATIVE CAT SCAN;  Surgeon: [name], MD;  Location: [facility];  Service: Neurosurgery;  Laterality: N/A;  . BREAST SURGERY Right 1985  . THYROID CYST EXCISION  1967    FAMILY HISTORY Family History  Problem Relation Age of Onset  . Heart disease Mother   . Hypertension Mother   . Heart disease Father   . Diabetes Father   . Breast cancer Paternal Aunt        4 paternal aunts with breast cancer over 60  . Prostate cancer Paternal Uncle   . Stroke Paternal Grandfather   . Ovarian cancer Paternal Aunt   . Huntington's disease Other        Nephew, inherited from his father   The patient's father died from a heart attack at the age of 43. He had 9 sisters. 3 of those sisters had breast cancer, all in a menopausal setting. Another sister had ovarian cancer. One of the paternal uncles had cancer of the colon "and back".  The patient's mother died at the age of 64. She was found to have breast cancer shortly before dying , during her final hospitalization.  GYNECOLOGIC HISTORY:  No LMP recorded. Patient has had a hysterectomy.  Menarche age 24, first live birth age 82, the patient is GX P1. She underwent  hysterectomy in 1980. She thinks the ovaries were removed, but the CT scan obtained 08/24/2014 showed a definite right ovary. The left ovary may have been removed. She did not take hormone replacement after the hysterectomy.  SOCIAL HISTORY:   Jalisia worked in Psychiatric nurse. She is divorced, lives alone, with 2 cats. Her son Bethann Berkshire lives in [place] where he works in Engineer, technical sales. He has 4 children of his own. The patient is a Psychologist, forensic.    ADVANCED DIRECTIVES:  In place. The patient has named her sister Pleas Koch [phone]) and her friend Janina Mayo [phone]) as joint healthcare powers of attorney   HEALTH MAINTENANCE: Social History   Tobacco Use  . Smoking status: Former Smoker    Packs/day: 0.50    Years: 10.00    Pack years: 5.00    Types: Cigarettes  . Smokeless tobacco: Former Systems developer  . Tobacco comment: quit smoking 1970's  Substance Use Topics  . Alcohol use: Yes    Alcohol/week: 0.0 oz    Comment: Rare  . Drug use: No  Colonoscopy: never  PAP: status post hysterectomy  Bone density: 08/14/2014 at [facility], results pending  Lipid panel:  Allergies  Allergen Reactions  . Ativan [Lorazepam]     Made her crazy  . Dilaudid [Hydromorphone Hcl]     Doesn't want  . Haldol [Haloperidol Lactate]     Made her crazy    Current Outpatient Medications  Medication Sig Dispense Refill  . Ascorbic Acid (VITAMIN C PO) Take 1 tablet by mouth every other day. Takes qod    . aspirin EC 325 MG tablet Take 1 tablet (325 mg total) by mouth daily. 30 tablet 0  . Cholecalciferol (VITAMIN D3 ADULT GUMMIES PO) Take 4,000 Units by mouth daily.    . Cyanocobalamin (VITAMIN B-12 PO) Take by mouth daily. Takes every other day    . gabapentin (NEURONTIN) 100 MG capsule Take 1 capsule (100 mg total) by mouth at bedtime. 90 capsule 4  . Iron-Vitamins (GERITOL PO) Take by mouth daily.    . palbociclib (IBRANCE) 75 MG capsule Take 1 tablet with food every other day, 3 weeks  on, one week off 11 capsule 6   No current facility-administered medications for this visit.     OBJECTIVE:  Older white woman who appears stated age  8:   08/21/17 1131  BP: 119/71  Pulse: 86  Resp: 17  Temp: 98 F (36.7 C)  SpO2: 98%     Body mass index is 23.05 kg/m.    ECOG FS:1 - Symptomatic but completely ambulatory GENERAL: Patient is a well appearing female in no acute distress HEENT:  Sclerae anicteric.  Oropharynx clear and moist. No ulcerations or evidence of oropharyngeal candidiasis. Neck is supple.  NODES:  No cervical, supraclavicular, or axillary lymphadenopathy palpated.  BREAST EXAM:  Unable to palpate left breast mass LUNGS:  Clear to auscultation bilaterally.  No wheezes or rhonchi. HEART:  Regular rate and rhythm. No murmur appreciated. ABDOMEN:  Soft, nontender.  Positive, normoactive bowel sounds. No organomegaly palpated. MSK:  No focal spinal tenderness to palpation. Full range of motion bilaterally in the upper extremities. EXTREMITIES:  No peripheral edema.   SKIN:  Clear with no obvious rashes or skin changes. No nail dyscrasia. NEURO:  Nonfocal. Well oriented.  Appropriate affect.    LAB RESULTS:  CMP     Component Value Date/Time   NA 139 07/24/2017 0952   NA 141 05/29/2017 1417   K 4.3 07/24/2017 0952   K 3.6 05/29/2017 1417   CL 104 07/24/2017 0952   CO2 29 07/24/2017 0952   CO2 27 05/29/2017 1417   GLUCOSE 74 07/24/2017 0952   GLUCOSE 111 05/29/2017 1417   BUN 14 07/24/2017 0952   BUN 17.0 05/29/2017 1417   CREATININE 0.72 07/24/2017 0952   CREATININE 0.8 05/29/2017 1417   CALCIUM 10.2 07/24/2017 0952   CALCIUM 10.7 (H) 06/26/2017 1216   CALCIUM 10.4 05/29/2017 1417   PROT 6.9 07/24/2017 0952   PROT 7.3 05/29/2017 1417   ALBUMIN 3.6 07/24/2017 0952   ALBUMIN 3.9 05/29/2017 1417   AST 17 07/24/2017 0952   AST 17 05/29/2017 1417   ALT 14 07/24/2017 0952   ALT 15 05/29/2017 1417   ALKPHOS 37 (L) 07/24/2017 0952   ALKPHOS  42 05/29/2017 1417   BILITOT 0.3 07/24/2017 0952   BILITOT 0.30 05/29/2017 1417   GFRNONAA >60 07/24/2017 0952   GFRNONAA 85 07/07/2016 1125   GFRAA >60 07/24/2017 0952   GFRAA >89 07/07/2016 1125    INo  results found for: SPEP, UPEP  Lab Results  Component Value Date   WBC 3.4 (L) 08/21/2017   NEUTROABS 1.8 08/21/2017   HGB 14.1 08/21/2017   HCT 41.1 08/21/2017   MCV 94.1 08/21/2017   PLT 186 08/21/2017      Chemistry      Component Value Date/Time   NA 139 07/24/2017 0952   NA 141 05/29/2017 1417   K 4.3 07/24/2017 0952   K 3.6 05/29/2017 1417   CL 104 07/24/2017 0952   CO2 29 07/24/2017 0952   CO2 27 05/29/2017 1417   BUN 14 07/24/2017 0952   BUN 17.0 05/29/2017 1417   CREATININE 0.72 07/24/2017 0952   CREATININE 0.8 05/29/2017 1417      Component Value Date/Time   CALCIUM 10.2 07/24/2017 0952   CALCIUM 10.7 (H) 06/26/2017 1216   CALCIUM 10.4 05/29/2017 1417   ALKPHOS 37 (L) 07/24/2017 0952   ALKPHOS 42 05/29/2017 1417   AST 17 07/24/2017 0952   AST 17 05/29/2017 1417   ALT 14 07/24/2017 0952   ALT 15 05/29/2017 1417   BILITOT 0.3 07/24/2017 0952   BILITOT 0.30 05/29/2017 1417       Lab Results  Component Value Date   LABCA2 24 01/01/2016    No components found for: [id number]  No results for input(s): INR in the last 168 hours.  Urinalysis    Component Value Date/Time   COLORURINE YELLOW 09/19/2015 1239   APPEARANCEUR CLOUDY (A) 09/19/2015 1239   LABSPEC 1.015 09/19/2015 1239   PHURINE 7.5 09/19/2015 1239   GLUCOSEU NEGATIVE 09/19/2015 1239   HGBUR NEGATIVE 09/19/2015 1239   BILIRUBINUR NEGATIVE 09/19/2015 1239   KETONESUR NEGATIVE 09/19/2015 1239   PROTEINUR NEGATIVE 09/19/2015 1239   NITRITE NEGATIVE 09/19/2015 1239   LEUKOCYTESUR NEGATIVE 09/19/2015 1239    STUDIES: Recent left breast ultrasonography reviewed with the patient  ASSESSMENT: 77 y.o. [place] woman with stage IV left breast cancer involving bone  (1) status post  right lumpectomy and axillary node dissection in 1985 followed by radiation at [place] 08/16/2014: measurable disease in spine, lung and left breast   (2) evaluation for left shoulder pain led to thoracic spine MRI 08/16/2014 showing a pathologic fracture at T3 with epidural tumor displacing the cord to the right, but no cord compression. CT scans of the chest, abdomen and pelvis 08/24/2014 showed in addition a mass in the upper outer quadrant left breast measuring 1.6 cm and a nodule in the minor fissure of the right lung measuring 1.2 cm, but no parenchymal lung or liver lesions.   (a) CA 27-29 was noninformative at 38 (09/20/2014)    (3) mammography and ultrasonography 08/29/2014 show a mass in the upper inner left breast which was palpable,  measuring 2.0 cm by ultrasound. Biopsy of this mass 08/29/2014 showed an invasive breast cancer with both lobular and ductal features, estrogen receptor positive, progesterone receptor weakly positive, with an MIB-1 in the 40% range, HER-2 equivocal (6 else ratio 1.5, but average number her nucleus 5.8)    (4) letrozole started 08/31/2014;   (a) palbociclib added Sept 2016 at 75 mg 21/7, with significant neutropenia; not repeated after first cycle  (b) letrozole discontinued 05/29/2017 with evidence of disease progression in the breast   (5)  zolendronate started 09/20/2014, stopped after initial dose due to poor tolerance  (a) denosumab/ Xgeva started 01/31/2015, repeated every 4 weeks  (b) changed to every 8 weeks as of August 2018.    (  6) on 10/20/2014 the patient underwent T2-T3 and T4 decompressive laminectomy with removal of epidural tumor, C7-T4 segmental pedicle screw instrumentation with virage screw system with arrow guidance protocol and C7-T4 posterolateral fusion. The cells were positive for the estrogen receptor. HER-2/neu testing by [facility] showed again equivocal results,  (a) most recent bone scan 10/01/2016 finds no new lesions  only postoperative changes  (7) radiation 11/23/2014-12/11/2014.  (a) T1-T5 was treated to 30 Gy in 12 fractions at 2.5 Gy per fraction   (8)  consider eventual left lumpectomy or mastectomy depending on  longer-term results of systemic therapy  (a) most recent left breast ultrasonography 10/30/2016 found this mass to measure 0.7 cm  (b) repeat left breast ultrasonography 05/13/2017 showed the upper outer quadrant mass to have grown to 1.4 cm  (9) genetics testing using the Breast/Ovarian Cancer Panel through [facility] [name], MD) found no deleterious mutations in ATM, BARD1, BRCA1, BRCA2, BRIP1, CDH1, CHEK2, EPCAM, FANCC, MLH1, MSH2, MSH6, NBN, PALB2, PMS2, PTEN, RAD51C, RAD51D, STK11, TP53, or XRCC2    (10) right thyroid nodule is a complex cyst as noted on CT scan of the neck 01/29/2016  (11) osteoporosis: Bone density at [facility] 09/12/2016 showed a T score of -4.8.  (a) continue denosumab/Xgeva as above  (12) fulvestrant started 05/29/2017  (a) started palbociclib 06/29/2017 at 75 mg every other day for 21 days on, 7 days off  (b) Held on 3/29 due to progressive fatigue and patient preference  (13) considering left lumpectomy  PLAN: Kathalina is doing well today.  Her labs are stable today and I reviewed them with her in detail.  Due to her preference and her progressive fatigue, she will forego the Palbociclib this month.  She will proceed with the Fulvestrant, Xgeva.  She is tolerating these well.  I had my nurse call and set up her breast ultrasound since it wasn't scheduled yet.  This will be on 09/08/17.  She will see Dr. Jana Hakim the following week.    She knows to call for any other issues that may develop before the next visit.  A total of (30) minutes of face-to-face time was spent with this patient with greater than 50% of that time in counseling and care-coordination.   Wilber Bihari  08/21/17 11:37 AM Medical Oncology and Hematology [facility] [address] [place], [place] [address] Tel. [phone]    Fax. [phone]

## 2017-09-08 ENCOUNTER — Ambulatory Visit
Admission: RE | Admit: 2017-09-08 | Discharge: 2017-09-08 | Disposition: A | Discharge status: Home / Self Care | Payer: Medicare Other | Source: Ambulatory Visit | Attending: Oncology | Admitting: Oncology

## 2017-09-08 DIAGNOSIS — C50412 Malignant neoplasm of upper-outer quadrant of left female breast: Secondary | ICD-10-CM

## 2017-09-08 DIAGNOSIS — C50912 Malignant neoplasm of unspecified site of left female breast: Secondary | ICD-10-CM | POA: Diagnosis not present

## 2017-09-08 DIAGNOSIS — Z170 Estrogen receptor positive status [ER+]: Secondary | ICD-10-CM

## 2017-09-08 DIAGNOSIS — C50919 Malignant neoplasm of unspecified site of unspecified female breast: Secondary | ICD-10-CM

## 2017-09-17 NOTE — Progress Notes (Signed)
[place]  [phone] [phone]   ID: April Rosales DOB: [birth year]  MR#: [id number]  AXE#:[id number]  Patient Care Team: April Pinto, MD as PCP - General (Internal Medicine) April Rosales, April Dad, MD as Consulting Physician (Oncology) April Killings, MD as Consulting Physician (Orthopedic Surgery) April Skates, MD as Consulting Physician (General Surgery) PCP: April Pinto, MD  OTHER: April Rosales, DDS  CHIEF COMPLAINT: Stage IV estrogen receptor positive breast cancer  CURRENT TREATMENT:   Fulvestrant, denosumab/Xgeva  INTERVAL HISTORY: April Rosales return s  today for follow-up and treatment of her estrogen receptor positive breast cancer accompanied by a friend. Since discontinuing the palbociclib, she is feeling better. She has is less fatigued and has more energy to be active outside her house.   She receives fulvestrant every 4 weeks, with good tolerance.   She also receives denosumab/Xgeva, currently every 8 weeks, with good tolerance. She denies complications or pain wit the shots.   Since her last visit here she had left breast and left axilla ultrasonography, 09/08/2017, showing evidence of continuing response.   REVIEW OF SYSTEMS: April Rosales reports that she will follow up with her dentist, April Rosales, in November. She sees him every 9 months, but she was advised to see him in August due to the potential effects of the denosumab. She denies unusual headaches, visual changes, nausea, vomiting, or dizziness. There has been no unusual cough, phlegm production, or pleurisy. This been no change in bowel or bladder habits. She denies unexplained fatigue or unexplained weight loss, bleeding, rash, or fever. A detailed review of systems was otherwise stable.  BREAST CANCER HISTORY: From the original intake note:   April Rosales underwent right lumpectomy in 1985 at [facility] for what sounds like a stage I breast cancer. She  tells me she had more than 30 lymph nodes removed from her right axilla and all of them were clear. She received  adjuvant radiation but no systemic treatment.  The patient had recently refused mammography with the last mammogram I can find dating back to August 2010.  More recently the patient presented with left scapular pain radiating down the left arm.  She was evaluated by Dr. Lorin Rosales, who obtained a chest x-ray showing a possible abnormality at T3. He then set up the patient for an MRI of the thoracic spine performed 08/16/2014.  This showed multiple compression fractures  (a similar picture had been noted on lumbar MRI 10/09/2011 ). However at T3 they noted tumor in the vertebral body extending into the left pedicle and into the lateral epidural space, displacing the cord to the right. There was no evidence of cord compression or cord signal abnormality. There were no other areas of tumor identified in the thoracic spine.         The patient was then referred to Dr. Hal Rosales who on 08/24/2014 set April Rosales up for CT scans of the chest, abdomen and pelvis. There was a dense mass in the upper inner quadrant of the left breast measuring 1.6 cm. There was a 1.2 cm nodule in the minor fissure of the right lung and some evidence of right lung fibrosis at the site of the prior radiation port.  There was also a thyroid mass measuring 2 cm. However there were no parenchymal lung or liver lesions. Incidental meningoceles were noted as well as sclerosis of the fifth and sixth ribs which were felt to be likely posttraumatic.   On 08/29/2014 the patient underwent bilateral diagnostic mammography  with tomosynthesis and left breast ultrasonography.  There were postsurgical changes in the upper right breast. In the left breast there was an irregular mass measuring 2.3 cm in the upper inner quadrant. This was palpable. Ultrasound showed this to be hypoechoic and to measure 2.0 cm. There were adjacent areas of nodularity.  There was no definite lymphadenopathy in the left axilla.  Biopsy of this breast mass 08/29/2014 showed an invasive adenocarcinoma with both ductal and lobular features (there was strong diffuse E-cadherin expression as well as areas with total absence of E-cadherin expression), with the preliminary prognostic profile showing strong estrogen positivity, very weak to near absent progesterone positivity, an MIB-1 of approximately 40%, and HER-2 equivocal  The patient's subsequent history is as detailed below.   PAST MEDICAL HISTORY: Past Medical History:  Diagnosis Date  . Arthritis   . Breast cancer [facility]) 1985/2016   takes [name] daily  . Chronic back pain    stenosis/listhesis  . Family history of ovarian cancer   . Osteoporosis    takes Vit D  . Radiation 11/23/14-12/11/14   30 Gy T1-T5     PAST SURGICAL HISTORY: Past Surgical History:  Procedure Laterality Date  . ABDOMINAL HYSTERECTOMY     1980  . APPENDECTOMY  1977  . APPLICATION OF INTRAOPERATIVE CT SCAN N/A 10/20/2014   Procedure: APPLICATION OF INTRAOPERATIVE CAT SCAN;  Surgeon: April Chimera, MD;  Location: [place] [facility];  Service: Neurosurgery;  Laterality: N/A;  . BREAST SURGERY Right 1985  . THYROID CYST EXCISION  1967    FAMILY HISTORY Family History  Problem Relation Age of Onset  . Heart disease Mother   . Hypertension Mother   . Heart disease Father   . Diabetes Father   . Breast cancer Paternal Aunt        4 paternal aunts with breast cancer over 36  . Prostate cancer Paternal Uncle   . Stroke Paternal Grandfather   . Ovarian cancer Paternal Aunt   . Huntington's disease Other        Nephew, inherited from his father   The patient's father died from a heart attack at the age of 44. He had 9 sisters. 3 of those sisters had breast cancer, all in a menopausal setting. Another sister had ovarian cancer. One of the paternal uncles had cancer of the colon "and back".  The patient's mother died at the age of 3.  She was found to have breast cancer shortly before dying , during her final hospitalization.  GYNECOLOGIC HISTORY:  No LMP recorded. Patient has had a hysterectomy.  Menarche age 65, first live birth age 17, the patient is GX P1. She underwent hysterectomy in 1980. She thinks the ovaries were removed, but the CT scan obtained 08/24/2014 showed a definite right ovary. The left ovary may have been removed. She did not take hormone replacement after the hysterectomy.  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 She is divorced, lives alone, with 2 cats. Her son Bethann Berkshire lives in [place] where he works in Engineer, technical sales. He has 4 children of his own. The patient is a Psychologist, forensic.    ADVANCED DIRECTIVES:  In place. The patient has named her sister Pleas Koch [phone]) and her friend Janina Mayo [phone]) as joint healthcare powers of attorney   HEALTH MAINTENANCE: Social History   Tobacco Use  . Smoking status: Former Smoker    Packs/day: 0.50    Years: 10.00    Pack years: 5.00  Types: Cigarettes  . Smokeless tobacco: Former Systems developer  . Tobacco comment: quit smoking 1970's  Substance Use Topics  . Alcohol use: Yes    Alcohol/week: 0.0 oz    Comment: Rare  . Drug use: No     Colonoscopy: never  PAP: status post hysterectomy  Bone density: 08/14/2014 at [facility], results pending  Lipid panel:  Allergies  Allergen Reactions  . Ativan [Lorazepam]     Made her crazy  . Dilaudid [Hydromorphone Hcl]     Doesn't want  . Haldol [Haloperidol Lactate]     Made her crazy    Current Outpatient Medications  Medication Sig Dispense Refill  . Ascorbic Acid (VITAMIN C PO) Take 1 tablet by mouth every other day. Takes qod    . aspirin EC 325 MG tablet Take 1 tablet (325 mg total) by mouth daily. 30 tablet 0  . Cholecalciferol (VITAMIN D3 ADULT GUMMIES PO) Take 4,000 Units by mouth daily.    . Cyanocobalamin (VITAMIN B-12 PO) Take by mouth daily. Takes every other day      . gabapentin (NEURONTIN) 100 MG capsule Take 1 capsule (100 mg total) by mouth at bedtime. 90 capsule 4  . Iron-Vitamins (GERITOL PO) Take by mouth daily.    . palbociclib (IBRANCE) 75 MG capsule Take 1 tablet with food every other day, 3 weeks on, one week off 11 capsule 6   No current facility-administered medications for this visit.     OBJECTIVE:  Older white woman in no acute distress  Vitals:   09/18/17 1020  BP: (!) 146/84  Pulse: 85  Resp: 19  Temp: 98.5 F (36.9 C)  SpO2: 96%     Body mass index is 23.03 kg/m.    ECOG FS:1 - Symptomatic but completely ambulatory  Sclerae unicteric, EOMs intact Oropharynx clear and moist No cervical or supraclavicular adenopathy Lungs no rales or rhonchi Heart regular rate and rhythm Abd soft, nontender, positive bowel sounds MSK no focal spinal tenderness, no upper extremity lymphedema Neuro: nonfocal, well oriented, appropriate affect Breasts: The right breast is status post remote surgery and radiation with no evidence of disease recurrence.  Left breast is unremarkable and I do not palpate a mass or node any skin or nipple change of concern..  Both axillae are benign  LAB RESULTS:  CMP     Component Value Date/Time   NA 139 09/18/2017 0957   NA 141 05/29/2017 1417   K 4.1 09/18/2017 0957   K 3.6 05/29/2017 1417   CL 106 09/18/2017 0957   CO2 26 09/18/2017 0957   CO2 27 05/29/2017 1417   GLUCOSE 83 09/18/2017 0957   GLUCOSE 111 05/29/2017 1417   BUN 16 09/18/2017 0957   BUN 17.0 05/29/2017 1417   CREATININE 0.70 09/18/2017 0957   CREATININE 0.8 05/29/2017 1417   CALCIUM 10.3 09/18/2017 0957   CALCIUM 10.7 (H) 06/26/2017 1216   CALCIUM 10.4 05/29/2017 1417   PROT 6.8 09/18/2017 0957   PROT 7.3 05/29/2017 1417   ALBUMIN 3.6 09/18/2017 0957   ALBUMIN 3.9 05/29/2017 1417   AST 19 09/18/2017 0957   AST 17 05/29/2017 1417   ALT 14 09/18/2017 0957   ALT 15 05/29/2017 1417   ALKPHOS 39 (L) 09/18/2017 0957   ALKPHOS 42  05/29/2017 1417   BILITOT 0.3 09/18/2017 0957   BILITOT 0.30 05/29/2017 1417   GFRNONAA >60 09/18/2017 0957   GFRNONAA 85 07/07/2016 1125   GFRAA >60 09/18/2017 0957   GFRAA >89 07/07/2016  [place] results found for: SPEP, UPEP  Lab Results  Component Value Date   WBC 4.4 09/18/2017   NEUTROABS 2.7 09/18/2017   HGB 14.3 09/18/2017   HCT 42.1 09/18/2017   MCV 94.9 09/18/2017   PLT 233 09/18/2017      Chemistry      Component Value Date/Time   NA 139 09/18/2017 0957   NA 141 05/29/2017 1417   K 4.1 09/18/2017 0957   K 3.6 05/29/2017 1417   CL 106 09/18/2017 0957   CO2 26 09/18/2017 0957   CO2 27 05/29/2017 1417   BUN 16 09/18/2017 0957   BUN 17.0 05/29/2017 1417   CREATININE 0.70 09/18/2017 0957   CREATININE 0.8 05/29/2017 1417      Component Value Date/Time   CALCIUM 10.3 09/18/2017 0957   CALCIUM 10.7 (H) 06/26/2017 1216   CALCIUM 10.4 05/29/2017 1417   ALKPHOS 39 (L) 09/18/2017 0957   ALKPHOS 42 05/29/2017 1417   AST 19 09/18/2017 0957   AST 17 05/29/2017 1417   ALT 14 09/18/2017 0957   ALT 15 05/29/2017 1417   BILITOT 0.3 09/18/2017 0957   BILITOT 0.30 05/29/2017 1417       Lab Results  Component Value Date   LABCA2 24 01/01/2016    No components found for: [id number]  No results for input(s): INR in the last 168 hours.  Urinalysis    Component Value Date/Time   COLORURINE YELLOW 09/19/2015 1239   APPEARANCEUR CLOUDY (A) 09/19/2015 1239   LABSPEC 1.015 09/19/2015 1239   PHURINE 7.5 09/19/2015 1239   GLUCOSEU NEGATIVE 09/19/2015 1239   HGBUR NEGATIVE 09/19/2015 1239   BILIRUBINUR NEGATIVE 09/19/2015 1239   KETONESUR NEGATIVE 09/19/2015 1239   PROTEINUR NEGATIVE 09/19/2015 1239   NITRITE NEGATIVE 09/19/2015 1239   LEUKOCYTESUR NEGATIVE 09/19/2015 1239    STUDIES: US Breast Ltd Uni Left Inc Axilla  Result Date: 09/08/2017 CLINICAL DATA:  Follow-up known left breast cancer. EXAM: ULTRASOUND OF THE LEFT BREAST COMPARISON:  Previous  exam(s). FINDINGS: On physical exam, no interval change identified. Targeted ultrasound is performed, showing the patient's known left breast cancer at 10 o'clock, 5 cm from the nipple measuring 7 by 9 x 3 mm today versus 8 by 14 x 4 mm previously. IMPRESSION: The patient has known breast cancer is a little smaller in the interval. RECOMMENDATION: The patient is due for bilateral mammography in June of 2019. Recommend continued follow-up by the patient's oncologist. I have discussed the findings and recommendations with the patient. Results were also provided in writing at the conclusion of the visit. If applicable, a reminder letter will be sent to the patient regarding the next appointment. BI-RADS CATEGORY  6: Known biopsy-proven malignancy. Electronically Signed   By: Dorise Bullion III M.D   On: 09/08/2017 12:53    ASSESSMENT: 77 y.o. [place] woman with stage IV left breast cancer involving bone  (1) status post right lumpectomy and axillary node dissection in 1985 followed by radiation at [facility] 08/16/2014: measurable disease in spine, lung and left breast   (2) evaluation for left shoulder pain led to thoracic spine MRI 08/16/2014 showing a pathologic fracture at T3 with epidural tumor displacing the cord to the right, but no cord compression. CT scans of the chest, abdomen and pelvis 08/24/2014 showed in addition a mass in the upper outer quadrant left breast measuring 1.6 cm and a nodule in the minor fissure of the right lung measuring 1.2 cm, but  no parenchymal lung or liver lesions.   (a) CA 27-29 was noninformative at 38 (09/20/2014)    (3) mammography and ultrasonography 08/29/2014 show a mass in the upper inner left breast which was palpable,  measuring 2.0 cm by ultrasound. Biopsy of this mass 08/29/2014 showed an invasive breast cancer with both lobular and ductal features, estrogen receptor positive, progesterone receptor weakly positive, with an MIB-1 in the 40%  range, HER-2 equivocal (6 else ratio 1.5, but average number her nucleus 5.8)    (4) letrozole started 08/31/2014;   (a) palbociclib added Sept 2016 at 75 mg 21/7, with significant neutropenia; not repeated after first cycle  (b) letrozole discontinued 05/29/2017 with evidence of disease progression in the breast   (5)  zolendronate started 09/20/2014, stopped after initial dose due to poor tolerance  (a) denosumab/ Xgeva started 01/31/2015, repeated every 4 weeks  (b) changed to every 8 weeks as of August 2018.    (6) on 10/20/2014 the patient underwent T2-T3 and T4 decompressive laminectomy with removal of epidural tumor, C7-T4 segmental pedicle screw instrumentation with virage screw system with arrow guidance protocol and C7-T4 posterolateral fusion. The cells were positive for the estrogen receptor. HER-2/neu testing by [facility] showed again equivocal results,  (a) most recent bone scan 10/01/2016 finds no new lesions only postoperative changes  (7) radiation 11/23/2014-12/11/2014.  (a) T1-T5 was treated to 30 Gy in 12 fractions at 2.5 Gy per fraction   (8)  consider eventual left lumpectomy or mastectomy depending on  longer-term results of systemic therapy  (a) most recent left breast ultrasonography 10/30/2016 found this mass to measure 0.7 cm  (b) repeat left breast ultrasonography 05/13/2017 showed the upper outer quadrant mass to have grown to 1.4 cm  (c) left breast ultrasound 09/08/2017 shows the mass to now measure 0.9 cm  (9) genetics testing using the Breast/Ovarian Cancer Panel through [facility] [name], MD) found no deleterious mutations in ATM, BARD1, BRCA1, BRCA2, BRIP1, CDH1, CHEK2, EPCAM, FANCC, MLH1, MSH2, MSH6, NBN, PALB2, PMS2, PTEN, RAD51C, RAD51D, STK11, TP53, or XRCC2    (10) right thyroid nodule is a complex cyst as noted on CT scan of the neck 01/29/2016  (11) osteoporosis: Bone density at [facility] 09/12/2016 showed a T score of -4.8.  (a) continue denosumab/Xgeva  as above  (12) fulvestrant started 05/29/2017  (a) started palbociclib 06/29/2017 at 75 mg every other day for 21 days on, 7 days off  (b) palbociclib discontinued on 3/29 due to progressive fatigue and patient preference  (13) considering left lumpectomy  PLAN: I spent approximately 30 minutes with April Rosales with most of that time spent discussing her complex problems.  She continues to wish to avoid surgery.  We reviewed the results of the left breast ultrasound in detail.  The mass has decreased from 1.4 to 0.9 cm.  She was on palbociclib until a month ago and that may have contributed to that response.  Cyan is feeling better off the palbociclib.  She was taking the lowest documented dose to work, namely 75 mg every other day.  There were no overwhelming symptoms just that she did not feel good.  She now feels better although she does not report any significant change in her functional status, which is limited.  She is tolerating the fulvestrant and the denosumab/Xgeva with no side effects.  We are continuing the Xgeva currently every 8 weeks.  When she sees me again in July we will consider going to every 12 weeks.  I have alerted  her to possible dental problems and suggested she see her dentist April Rosales every 6 months instead of every 9 months.  We will repeat an ultrasound of the left breast before her July visit.  If there has been any evidence of growth we will consider going back to palbociclib or considering abemaciclib which she might just tolerate a little bit better  She knows to call for any other issues that may develop before the next visit.  April Rosales Camaryn Lumbert MD   09/18/17 10:49 AM Medical Oncology and Hematology [facility] [address] [place], [place] [address] Tel. [phone]    Fax. [phone]  This document serves as a record of services personally performed by Lurline Del, MD. It was created on his behalf by Sheron Nightingale, a trained  medical scribe. The creation of this record is based on the scribe's personal observations and the provider's statements to them.   I have reviewed the above documentation for accuracy and completeness, and I agree with the above.

## 2017-09-18 ENCOUNTER — Telehealth: Payer: Self-pay | Admitting: Oncology

## 2017-09-18 ENCOUNTER — Other Ambulatory Visit: Payer: Medicare Other

## 2017-09-18 ENCOUNTER — Inpatient Hospital Stay (HOSPITAL_BASED_OUTPATIENT_CLINIC_OR_DEPARTMENT_OTHER): Payer: Medicare Other | Admitting: Oncology

## 2017-09-18 ENCOUNTER — Inpatient Hospital Stay: Payer: Medicare Other

## 2017-09-18 ENCOUNTER — Inpatient Hospital Stay: Payer: Medicare Other | Attending: Oncology

## 2017-09-18 ENCOUNTER — Other Ambulatory Visit: Payer: Self-pay | Admitting: Oncology

## 2017-09-18 VITALS — BP 146/84 | HR 85 | Temp 98.5°F | Resp 19 | Ht 61.5 in | Wt 123.9 lb

## 2017-09-18 DIAGNOSIS — C7951 Secondary malignant neoplasm of bone: Secondary | ICD-10-CM | POA: Diagnosis not present

## 2017-09-18 DIAGNOSIS — C50912 Malignant neoplasm of unspecified site of left female breast: Secondary | ICD-10-CM

## 2017-09-18 DIAGNOSIS — C50412 Malignant neoplasm of upper-outer quadrant of left female breast: Secondary | ICD-10-CM

## 2017-09-18 DIAGNOSIS — Z853 Personal history of malignant neoplasm of breast: Secondary | ICD-10-CM

## 2017-09-18 DIAGNOSIS — C50919 Malignant neoplasm of unspecified site of unspecified female breast: Secondary | ICD-10-CM

## 2017-09-18 DIAGNOSIS — M810 Age-related osteoporosis without current pathological fracture: Secondary | ICD-10-CM

## 2017-09-18 DIAGNOSIS — Z5111 Encounter for antineoplastic chemotherapy: Secondary | ICD-10-CM | POA: Insufficient documentation

## 2017-09-18 LAB — COMPREHENSIVE METABOLIC PANEL
ALT: 14 U/L (ref 0–55)
AST: 19 U/L (ref 5–34)
Albumin: 3.6 g/dL (ref 3.5–5.0)
Alkaline Phosphatase: 39 U/L — ABNORMAL LOW (ref 40–150)
Anion gap: 7 (ref 3–11)
BUN: 16 mg/dL (ref 7–26)
CO2: 26 mmol/L (ref 22–29)
Calcium: 10.3 mg/dL (ref 8.4–10.4)
Chloride: 106 mmol/L (ref 98–109)
Creatinine, Ser: 0.7 mg/dL (ref 0.60–1.10)
Glucose, Bld: 83 mg/dL (ref 70–140)
POTASSIUM: 4.1 mmol/L (ref 3.5–5.1)
Sodium: 139 mmol/L (ref 136–145)
Total Bilirubin: 0.3 mg/dL (ref 0.2–1.2)
Total Protein: 6.8 g/dL (ref 6.4–8.3)

## 2017-09-18 LAB — CBC WITH DIFFERENTIAL/PLATELET
Basophils Absolute: 0.1 10*3/uL (ref 0.0–0.1)
Basophils Relative: 1 %
Eosinophils Absolute: 0.2 10*3/uL (ref 0.0–0.5)
Eosinophils Relative: 5 %
HCT: 42.1 % (ref 34.8–46.6)
HEMOGLOBIN: 14.3 g/dL (ref 11.6–15.9)
LYMPHS PCT: 18 %
Lymphs Abs: 0.8 10*3/uL — ABNORMAL LOW (ref 0.9–3.3)
MCH: 32.2 pg (ref 25.1–34.0)
MCHC: 33.9 g/dL (ref 31.5–36.0)
MCV: 94.9 fL (ref 79.5–101.0)
Monocytes Absolute: 0.6 10*3/uL (ref 0.1–0.9)
Monocytes Relative: 13 %
NEUTROS PCT: 63 %
Neutro Abs: 2.7 10*3/uL (ref 1.5–6.5)
Platelets: 233 10*3/uL (ref 145–400)
RBC: 4.43 MIL/uL (ref 3.70–5.45)
RDW: 15.3 % — ABNORMAL HIGH (ref 11.2–14.5)
WBC: 4.4 10*3/uL (ref 3.9–10.3)

## 2017-09-18 MED ORDER — FULVESTRANT 250 MG/5ML IM SOLN
500.0000 mg | Freq: Once | INTRAMUSCULAR | Status: AC
  Administered 2017-09-18: 500 mg via INTRAMUSCULAR

## 2017-09-18 MED ORDER — FULVESTRANT 250 MG/5ML IM SOLN
INTRAMUSCULAR | Status: AC
  Filled 2017-09-18: qty 5

## 2017-09-18 NOTE — Patient Instructions (Signed)

## 2017-09-18 NOTE — Telephone Encounter (Signed)
Gave patient AVs and calendar of upcoming appointments.  °

## 2017-09-21 ENCOUNTER — Other Ambulatory Visit: Payer: Self-pay | Admitting: Pharmacist

## 2017-10-06 ENCOUNTER — Other Ambulatory Visit: Payer: Self-pay | Admitting: Oncology

## 2017-10-06 DIAGNOSIS — C7951 Secondary malignant neoplasm of bone: Secondary | ICD-10-CM

## 2017-10-16 ENCOUNTER — Inpatient Hospital Stay: Payer: Medicare Other | Attending: Oncology

## 2017-10-16 ENCOUNTER — Inpatient Hospital Stay: Payer: Medicare Other

## 2017-10-16 DIAGNOSIS — C7951 Secondary malignant neoplasm of bone: Secondary | ICD-10-CM

## 2017-10-16 DIAGNOSIS — C50412 Malignant neoplasm of upper-outer quadrant of left female breast: Secondary | ICD-10-CM | POA: Insufficient documentation

## 2017-10-16 DIAGNOSIS — Z5111 Encounter for antineoplastic chemotherapy: Secondary | ICD-10-CM | POA: Insufficient documentation

## 2017-10-16 DIAGNOSIS — C50912 Malignant neoplasm of unspecified site of left female breast: Secondary | ICD-10-CM

## 2017-10-16 DIAGNOSIS — M810 Age-related osteoporosis without current pathological fracture: Secondary | ICD-10-CM

## 2017-10-16 DIAGNOSIS — C50919 Malignant neoplasm of unspecified site of unspecified female breast: Secondary | ICD-10-CM

## 2017-10-16 LAB — COMPREHENSIVE METABOLIC PANEL
ALBUMIN: 3.8 g/dL (ref 3.5–5.0)
ALT: 16 U/L (ref 0–55)
ANION GAP: 9 (ref 3–11)
AST: 19 U/L (ref 5–34)
Alkaline Phosphatase: 38 U/L — ABNORMAL LOW (ref 40–150)
BILIRUBIN TOTAL: 0.3 mg/dL (ref 0.2–1.2)
BUN: 17 mg/dL (ref 7–26)
CO2: 27 mmol/L (ref 22–29)
Calcium: 10.2 mg/dL (ref 8.4–10.4)
Chloride: 105 mmol/L (ref 98–109)
Creatinine, Ser: 0.72 mg/dL (ref 0.60–1.10)
GFR calc Af Amer: >60 mL/min (ref >60)
GFR calc non Af Amer: >60 mL/min (ref >60)
GLUCOSE: 76 mg/dL (ref 70–140)
Potassium: 4.6 mmol/L (ref 3.5–5.1)
Sodium: 141 mmol/L (ref 136–145)
TOTAL PROTEIN: 6.9 g/dL (ref 6.4–8.3)

## 2017-10-16 LAB — CBC WITH DIFFERENTIAL/PLATELET
BASOS ABS: 0.1 10*3/uL (ref 0.0–0.1)
Basophils Relative: 1 %
Eosinophils Absolute: 0.2 10*3/uL (ref 0.0–0.5)
Eosinophils Relative: 4 %
HEMATOCRIT: 41.2 % (ref 34.8–46.6)
Hemoglobin: 14 g/dL (ref 11.6–15.9)
LYMPHS ABS: 1.1 10*3/uL (ref 0.9–3.3)
LYMPHS PCT: 24 %
MCH: 32.1 pg (ref 25.1–34.0)
MCHC: 34 g/dL (ref 31.5–36.0)
MCV: 94.3 fL (ref 79.5–101.0)
MONO ABS: 0.5 10*3/uL (ref 0.1–0.9)
Monocytes Relative: 11 %
NEUTROS ABS: 2.8 10*3/uL (ref 1.5–6.5)
Neutrophils Relative %: 60 %
Platelets: 215 10*3/uL (ref 145–400)
RBC: 4.37 MIL/uL (ref 3.70–5.45)
RDW: 13.8 % (ref 11.2–14.5)
WBC: 4.6 10*3/uL (ref 3.9–10.3)

## 2017-10-16 MED ORDER — FULVESTRANT 250 MG/5ML IM SOLN
INTRAMUSCULAR | Status: AC
  Filled 2017-10-16: qty 10

## 2017-10-16 MED ORDER — FULVESTRANT 250 MG/5ML IM SOLN
500.0000 mg | Freq: Once | INTRAMUSCULAR | Status: AC
  Administered 2017-10-16: 500 mg via INTRAMUSCULAR

## 2017-10-16 MED ORDER — DENOSUMAB 120 MG/1.7ML ~~LOC~~ SOLN
SUBCUTANEOUS | Status: AC
  Filled 2017-10-16: qty 1.7

## 2017-10-16 MED ORDER — DENOSUMAB 120 MG/1.7ML ~~LOC~~ SOLN
120.0000 mg | Freq: Once | SUBCUTANEOUS | Status: AC
  Administered 2017-10-16: 120 mg via SUBCUTANEOUS

## 2017-10-16 NOTE — Patient Instructions (Signed)
Denosumab injection What is this medicine? DENOSUMAB (den oh sue mab) slows bone breakdown. Prolia is used to treat osteoporosis in women after menopause and in men. Delton See is used to treat a high calcium level due to cancer and to prevent bone fractures and other bone problems caused by multiple myeloma or cancer bone metastases. Delton See is also used to treat giant cell tumor of the bone. This medicine may be used for other purposes; ask your health care provider or pharmacist if you have questions. COMMON BRAND NAME(S): Prolia, XGEVA What should I tell my health care provider before I take this medicine? They need to know if you have any of these conditions: -dental disease -having surgery or tooth extraction -infection -kidney disease -low levels of calcium or Vitamin D in the blood -malnutrition -on hemodialysis -skin conditions or sensitivity -thyroid or parathyroid disease -an unusual reaction to denosumab, other medicines, foods, dyes, or preservatives -pregnant or trying to get pregnant -breast-feeding How should I use this medicine? This medicine is for injection under the skin. It is given by a health care professional in a hospital or clinic setting. If you are getting Prolia, a special MedGuide will be given to you by the pharmacist with each prescription and refill. Be sure to read this information carefully each time. For Prolia, talk to your pediatrician regarding the use of this medicine in children. Special care may be needed. For Delton See, talk to your pediatrician regarding the use of this medicine in children. While this drug may be prescribed for children as young as 13 years for selected conditions, precautions do apply. Overdosage: If you think you have taken too much of this medicine contact a poison control center or emergency room at once. NOTE: This medicine is only for you. Do not share this medicine with others. What if I miss a dose? It is important not to miss your  dose. Call your doctor or health care professional if you are unable to keep an appointment. What may interact with this medicine? Do not take this medicine with any of the following medications: -other medicines containing denosumab This medicine may also interact with the following medications: -medicines that lower your chance of fighting infection -steroid medicines like prednisone or cortisone This list may not describe all possible interactions. Give your health care provider a list of all the medicines, herbs, non-prescription drugs, or dietary supplements you use. Also tell them if you smoke, drink alcohol, or use illegal drugs. Some items may interact with your medicine. What should I watch for while using this medicine? Visit your doctor or health care professional for regular checks on your progress. Your doctor or health care professional may order blood tests and other tests to see how you are doing. Call your doctor or health care professional for advice if you get a fever, chills or sore throat, or other symptoms of a cold or flu. Do not treat yourself. This drug may decrease your body's ability to fight infection. Try to avoid being around people who are sick. You should make sure you get enough calcium and vitamin D while you are taking this medicine, unless your doctor tells you not to. Discuss the foods you eat and the vitamins you take with your health care professional. See your dentist regularly. Brush and floss your teeth as directed. Before you have any dental work done, tell your dentist you are receiving this medicine. Do not become pregnant while taking this medicine or for 5 months after stopping  it. Talk with your doctor or health care professional about your birth control options while taking this medicine. Women should inform their doctor if they wish to become pregnant or think they might be pregnant. There is a potential for serious side effects to an unborn child. Talk  to your health care professional or pharmacist for more information. What side effects may I notice from receiving this medicine? Side effects that you should report to your doctor or health care professional as soon as possible: -allergic reactions like skin rash, itching or hives, swelling of the face, lips, or tongue -bone pain -breathing problems -dizziness -jaw pain, especially after dental work -redness, blistering, peeling of the skin -signs and symptoms of infection like fever or chills; cough; sore throat; pain or trouble passing urine -signs of low calcium like fast heartbeat, muscle cramps or muscle pain; pain, tingling, numbness in the hands or feet; seizures -unusual bleeding or bruising -unusually weak or tired Side effects that usually do not require medical attention (report to your doctor or health care professional if they continue or are bothersome): -constipation -diarrhea -headache -joint pain -loss of appetite -muscle pain -runny nose -tiredness -upset stomach This list may not describe all possible side effects. Call your doctor for medical advice about side effects. You may report side effects to FDA at 1-800-FDA-1088. Where should I keep my medicine? This medicine is only given in a clinic, doctor's office, or other health care setting and will not be stored at home. NOTE: This sheet is a summary. It may not cover all possible information. If you have questions about this medicine, talk to your doctor, pharmacist, or health care provider.  2018 [employer] (2016-06-03 19:17:21)   Fulvestrant injection What is this medicine? FULVESTRANT (ful VES trant) blocks the effects of estrogen. It is used to treat breast cancer. This medicine may be used for other purposes; ask your health care provider or pharmacist if you have questions. COMMON BRAND NAME(S): FASLODEX What should I tell my health care provider before I take this medicine? They need to  know if you have any of these conditions: -bleeding problems -liver disease -low levels of platelets in the blood -an unusual or allergic reaction to fulvestrant, other medicines, foods, dyes, or preservatives -pregnant or trying to get pregnant -breast-feeding How should I use this medicine? This medicine is for injection into a muscle. It is usually given by a health care professional in a hospital or clinic setting. Talk to your pediatrician regarding the use of this medicine in children. Special care may be needed. Overdosage: If you think you have taken too much of this medicine contact a poison control center or emergency room at once. NOTE: This medicine is only for you. Do not share this medicine with others. What if I miss a dose? It is important not to miss your dose. Call your doctor or health care professional if you are unable to keep an appointment. What may interact with this medicine? -medicines that treat or prevent blood clots like warfarin, enoxaparin, and dalteparin This list may not describe all possible interactions. Give your health care provider a list of all the medicines, herbs, non-prescription drugs, or dietary supplements you use. Also tell them if you smoke, drink alcohol, or use illegal drugs. Some items may interact with your medicine. What should I watch for while using this medicine? Your condition will be monitored carefully while you are receiving this medicine. You will need important blood work done while you   this medicine. Do not become pregnant while taking this medicine or for at least 1 year after stopping it. Women of child-bearing potential will need to have a negative pregnancy test before starting this medicine. Women should inform their doctor if they wish to become pregnant or think they might be pregnant. There is a potential for serious side effects to an unborn child. Men should inform their doctors if they wish to father a child. This  medicine may lower sperm counts. Talk to your health care professional or pharmacist for more information. Do not breast-feed an infant while taking this medicine or for 1 year after the last dose. What side effects may I notice from receiving this medicine? Side effects that you should report to your doctor or health care professional as soon as possible: -allergic reactions like skin rash, itching or hives, swelling of the face, lips, or tongue -feeling faint or lightheaded, falls -pain, tingling, numbness, or weakness in the legs -signs and symptoms of infection like fever or chills; cough; flu-like symptoms; sore throat -vaginal bleeding Side effects that usually do not require medical attention (report to your doctor or health care professional if they continue or are bothersome): -aches, pains -constipation -diarrhea -headache -hot flashes -nausea, vomiting -pain at site where injected -stomach pain This list may not describe all possible side effects. Call your doctor for medical advice about side effects. You may report side effects to FDA at 1-800-FDA-1088. Where should I keep my medicine? This drug is given in a hospital or clinic and will not be stored at home. NOTE: This sheet is a summary. It may not cover all possible information. If you have questions about this medicine, talk to your doctor, pharmacist, or health care provider.  2018 [employer] Standard (2014-12-08 11:03:55)

## 2017-11-13 ENCOUNTER — Inpatient Hospital Stay: Payer: Medicare Other | Attending: Oncology

## 2017-11-13 ENCOUNTER — Ambulatory Visit: Payer: Medicare Other

## 2017-11-13 ENCOUNTER — Inpatient Hospital Stay: Payer: Medicare Other

## 2017-11-13 DIAGNOSIS — M810 Age-related osteoporosis without current pathological fracture: Secondary | ICD-10-CM

## 2017-11-13 DIAGNOSIS — C7951 Secondary malignant neoplasm of bone: Secondary | ICD-10-CM | POA: Diagnosis not present

## 2017-11-13 DIAGNOSIS — C50919 Malignant neoplasm of unspecified site of unspecified female breast: Secondary | ICD-10-CM

## 2017-11-13 DIAGNOSIS — Z5111 Encounter for antineoplastic chemotherapy: Secondary | ICD-10-CM | POA: Insufficient documentation

## 2017-11-13 DIAGNOSIS — C50412 Malignant neoplasm of upper-outer quadrant of left female breast: Secondary | ICD-10-CM

## 2017-11-13 DIAGNOSIS — C50912 Malignant neoplasm of unspecified site of left female breast: Secondary | ICD-10-CM

## 2017-11-13 LAB — CBC WITH DIFFERENTIAL/PLATELET
BASOS ABS: 0.1 10*3/uL (ref 0.0–0.1)
Basophils Relative: 1 %
EOS PCT: 3 %
Eosinophils Absolute: 0.2 10*3/uL (ref 0.0–0.5)
HEMATOCRIT: 42.1 % (ref 34.8–46.6)
Hemoglobin: 14.5 g/dL (ref 11.6–15.9)
LYMPHS ABS: 1.2 10*3/uL (ref 0.9–3.3)
LYMPHS PCT: 25 %
MCH: 32.3 pg (ref 25.1–34.0)
MCHC: 34.4 g/dL (ref 31.5–36.0)
MCV: 93.8 fL (ref 79.5–101.0)
MONO ABS: 0.5 10*3/uL (ref 0.1–0.9)
MONOS PCT: 10 %
NEUTROS ABS: 2.9 10*3/uL (ref 1.5–6.5)
Neutrophils Relative %: 61 %
Platelets: 200 10*3/uL (ref 145–400)
RBC: 4.49 MIL/uL (ref 3.70–5.45)
RDW: 13.1 % (ref 11.2–14.5)
WBC: 4.7 10*3/uL (ref 3.9–10.3)

## 2017-11-13 LAB — COMPREHENSIVE METABOLIC PANEL
ALBUMIN: 3.8 g/dL (ref 3.5–5.0)
ALT: 11 U/L (ref 0–55)
AST: 19 U/L (ref 5–34)
Alkaline Phosphatase: 38 U/L — ABNORMAL LOW (ref 40–150)
Anion gap: 6 (ref 3–11)
BUN: 17 mg/dL (ref 7–26)
CHLORIDE: 105 mmol/L (ref 98–109)
CO2: 29 mmol/L (ref 22–29)
Calcium: 10.4 mg/dL (ref 8.4–10.4)
Creatinine, Ser: 0.7 mg/dL (ref 0.60–1.10)
GFR calc Af Amer: >60 mL/min (ref >60)
GFR calc non Af Amer: >60 mL/min (ref >60)
GLUCOSE: 80 mg/dL (ref 70–140)
POTASSIUM: 4.6 mmol/L (ref 3.5–5.1)
SODIUM: 140 mmol/L (ref 136–145)
Total Bilirubin: 0.3 mg/dL (ref 0.2–1.2)
Total Protein: 6.9 g/dL (ref 6.4–8.3)

## 2017-11-13 MED ORDER — FULVESTRANT 250 MG/5ML IM SOLN
INTRAMUSCULAR | Status: AC
  Filled 2017-11-13: qty 10

## 2017-11-13 MED ORDER — FULVESTRANT 250 MG/5ML IM SOLN
500.0000 mg | Freq: Once | INTRAMUSCULAR | Status: AC
  Administered 2017-11-13: 500 mg via INTRAMUSCULAR

## 2017-11-13 NOTE — Patient Instructions (Signed)

## 2017-11-25 ENCOUNTER — Ambulatory Visit (INDEPENDENT_AMBULATORY_CARE_PROVIDER_SITE_OTHER): Payer: Medicare Other | Admitting: Internal Medicine

## 2017-11-25 ENCOUNTER — Encounter: Payer: Self-pay | Admitting: Internal Medicine

## 2017-11-25 VITALS — BP 132/76 | HR 80 | Temp 97.5°F | Resp 16 | Ht 61.5 in | Wt 124.6 lb

## 2017-11-25 DIAGNOSIS — E559 Vitamin D deficiency, unspecified: Secondary | ICD-10-CM

## 2017-11-25 DIAGNOSIS — R7309 Other abnormal glucose: Secondary | ICD-10-CM | POA: Diagnosis not present

## 2017-11-25 DIAGNOSIS — R7303 Prediabetes: Secondary | ICD-10-CM | POA: Diagnosis not present

## 2017-11-25 DIAGNOSIS — Z136 Encounter for screening for cardiovascular disorders: Secondary | ICD-10-CM

## 2017-11-25 DIAGNOSIS — Z8249 Family history of ischemic heart disease and other diseases of the circulatory system: Secondary | ICD-10-CM

## 2017-11-25 DIAGNOSIS — Z79899 Other long term (current) drug therapy: Secondary | ICD-10-CM | POA: Diagnosis not present

## 2017-11-25 DIAGNOSIS — E782 Mixed hyperlipidemia: Secondary | ICD-10-CM

## 2017-11-25 DIAGNOSIS — Z1211 Encounter for screening for malignant neoplasm of colon: Secondary | ICD-10-CM

## 2017-11-25 DIAGNOSIS — Z1212 Encounter for screening for malignant neoplasm of rectum: Secondary | ICD-10-CM

## 2017-11-25 DIAGNOSIS — I10 Essential (primary) hypertension: Secondary | ICD-10-CM | POA: Diagnosis not present

## 2017-11-25 DIAGNOSIS — I2510 Atherosclerotic heart disease of native coronary artery without angina pectoris: Secondary | ICD-10-CM

## 2017-11-25 DIAGNOSIS — I2583 Coronary atherosclerosis due to lipid rich plaque: Secondary | ICD-10-CM

## 2017-11-25 DIAGNOSIS — R0989 Other specified symptoms and signs involving the circulatory and respiratory systems: Secondary | ICD-10-CM

## 2017-11-25 DIAGNOSIS — Z87891 Personal history of nicotine dependence: Secondary | ICD-10-CM

## 2017-11-25 NOTE — Patient Instructions (Addendum)

## 2017-11-25 NOTE — Progress Notes (Signed)
[facility] [name], M.D.     Uvaldo Bristle. Silverio Lay, P.A.-C Liane Comber, [place] [address] [place], [place] [address] Telephone [phone] Telefax [phone] Comprehensive Evaluation &  Examination     This very nice 77 y.o. DWF presents for a  comprehensive evaluation and management of multiple medical co-morbidities.  Patient has been followed for HTN, HLD, Prediabetes  and Vitamin D Deficiency.     Patient  followed closely by Dr Jana Hakim for remote hx/o Rt Breast cancer (1984) with a 2sd  primary Lt Breast cancer metastatic to T3 vertebrae - s/p T3 decompression Mar 2016 followed by Chemoradiation and now on maintenance Letrozole.     Patient has long hx/o labile HTN monitored expectantly. Patient's BP has been controlled and patient denies any cardiac symptoms as chest pain, palpitations, shortness of breath, dizziness or ankle swelling. Today's BP is at goal - 132/76.      Patient's hyperlipidemia is not controlled with diet and her reluctuance to take Chol meds. Last lipids were not at goal:  Lab Results  Component Value Date   CHOL 197 09/19/2015   HDL 56 09/19/2015   LDLCALC 119 09/19/2015   TRIG 109 09/19/2015   CHOLHDL 3.5 09/19/2015      Patient has hx/o elevated glucoses in the past & is monitored expectantly for  prediabetes and patient denies reactive hypoglycemic symptoms, visual blurring, diabetic polys, or paresthesias. Last A1c was Normal & at goal: Lab Results  Component Value Date   HGBA1C 5.4 12/30/2016      Finally, patient has history of Vitamin D Deficiency and last Vitamin D was at goal: Lab Results  Component Value Date   VD25OH 66 09/19/2015   Current Outpatient Medications on File Prior to Visit  Medication Sig  . Ascorbic Acid (VITAMIN C PO) Take 1 tablet by mouth every other day. Takes qod  . aspirin EC 325 MG tablet Take 1 tablet (325 mg total) by mouth  daily.  . Cholecalciferol (VITAMIN D3 ADULT GUMMIES PO) Take 2,000 Units by mouth daily.   . Cyanocobalamin (VITAMIN B-12 PO) Take by mouth daily. Takes every other day  . gabapentin (NEURONTIN) 100 MG capsule Take 1 capsule (100 mg total) by mouth at bedtime.  . Iron-Vitamins (GERITOL PO) Take by mouth daily.   No current facility-administered medications on file prior to visit.    Allergies  Allergen Reactions  . Ativan [Lorazepam]     Made her crazy  . Dilaudid [Hydromorphone Hcl]     Doesn't want  . Haldol [Haloperidol Lactate]     Made her crazy   Past Medical History:  Diagnosis Date  . Arthritis   . Breast cancer [facility]) 1985/2016   takes [name] daily  . Chronic back pain    stenosis/listhesis  . Family history of ovarian cancer   . Osteoporosis    takes Vit D  . Radiation 11/23/14-12/11/14   30 Gy T1-T5    Health Maintenance  Topic Date Due  . PNA vac Low Risk Adult (1 of 2 - PCV13) 05/27/2004  . INFLUENZA VACCINE  12/24/2017  . TETANUS/TDAP  01/04/2018  . DEXA SCAN  Completed   Immunization History  Administered Date(s) Administered  . Influenza Split 02/07/2013, 02/27/2017  . Influenza, High Dose Seasonal PF 03/14/2015, 03/24/2016  . Td 01/05/2008   Last Colon - Never - patient refuse screening Last MGM - 10/30/2016 and scheduled for 12/08/2017  Past Surgical History:  Procedure Laterality Date  . ABDOMINAL HYSTERECTOMY     1980  . APPENDECTOMY  1977  . APPLICATION OF INTRAOPERATIVE CT SCAN N/A 10/20/2014   Procedure: APPLICATION OF INTRAOPERATIVE CAT SCAN;  Surgeon: [name], MD;  Location: [facility];  Service: Neurosurgery;  Laterality: N/A;  . BREAST SURGERY Right 1985  . THYROID CYST EXCISION  1967   Family History  Problem Relation Age of Onset  . Heart disease Mother   . Hypertension Mother   . Heart disease Father   . Diabetes Father   . Breast cancer Paternal Aunt        4 paternal aunts with breast cancer over 42  . Prostate cancer  Paternal Uncle   . Stroke Paternal Grandfather   . Ovarian cancer Paternal Aunt   . Huntington's disease Other        Nephew, inherited from his father   Social History   Tobacco Use  . Smoking status: Former Smoker    Packs/day: 0.50    Years: 10.00    Pack years: 5.00    Types: Cigarettes  . Smokeless tobacco: Former Systems developer  . Tobacco comment: quit smoking 1970's  Substance Use Topics  . Alcohol use: Yes    Alcohol/week: 0.0 oz    Comment: Rare  . Drug use: No    ROS Constitutional: Denies fever, chills, weight loss/gain, headaches, insomnia,  night sweats, and change in appetite. Does c/o fatigue. Eyes: Denies redness, blurred vision, diplopia, discharge, itchy, watery eyes.  ENT: Denies discharge, congestion, post nasal drip, epistaxis, sore throat, earache, hearing loss, dental pain, Tinnitus, Vertigo, Sinus pain, snoring.  Cardio: Denies chest pain, palpitations, irregular heartbeat, syncope, dyspnea, diaphoresis, orthopnea, PND, claudication, edema Respiratory: denies cough, dyspnea, DOE, pleurisy, hoarseness, laryngitis, wheezing.  Gastrointestinal: Denies dysphagia, heartburn, reflux, water brash, pain, cramps, nausea, vomiting, bloating, diarrhea, constipation, hematemesis, melena, hematochezia, jaundice, hemorrhoids Genitourinary: Denies dysuria, frequency, urgency, nocturia, hesitancy, discharge, hematuria, flank pain Breast: Breast lumps, nipple discharge, bleeding.  Musculoskeletal: Denies arthralgia, myalgia, stiffness, Jt. Swelling, pain, limp, and strain/sprain. Denies falls. Skin: Denies puritis, rash, hives, warts, acne, eczema, changing in skin lesion Neuro: No weakness, tremor, incoordination, spasms, paresthesia, pain Psychiatric: Denies confusion, memory loss, sensory loss. Denies Depression. Endocrine: Denies change in weight, skin, hair change, nocturia, and paresthesia, diabetic polys, visual blurring, hyper / hypo glycemic episodes.  Heme/Lymph: No  excessive bleeding, bruising, enlarged lymph nodes.  Physical Exam  BP 132/76   Pulse 80   Temp (!) 97.5 F (36.4 C)   Resp 16   Ht 5' 1.5" (1.562 m)   Wt 124 lb 9.6 oz (56.5 kg)   BMI 23.16 kg/m   General Appearance: Well nourished, well groomed and in no apparent distress.  Eyes: PERRLA, EOMs, conjunctiva no swelling or erythema, normal fundi and vessels. Sinuses: No frontal/maxillary tenderness ENT/Mouth: EACs patent / TMs  nl. Nares clear without erythema, swelling, mucoid exudates. Oral hygiene is good. No erythema, swelling, or exudate. Tongue normal, non-obstructing. Tonsils not swollen or erythematous. Hearing normal.  Neck: Supple, thyroid not palpable. No bruits, nodes or JVD. Respiratory: Respiratory effort normal.  BS equal and clear bilateral without rales, rhonci, wheezing or stridor. Cardio: Heart sounds are normal with regular rate and rhythm and no murmurs, rubs or gallops. Peripheral pulses are normal and equal bilaterally without edema. No aortic or femoral bruits. Chest: symmetric with normal excursions and percussion. Breasts: Symmetric, without lumps, nipple discharge, retractions, or fibrocystic changes.  Abdomen: Flat, soft with bowel  sounds active. Nontender, no guarding, rebound, hernias, masses, or organomegaly.  Lymphatics: Non tender without lymphadenopathy.  Genitourinary:  Musculoskeletal: Full ROM all peripheral extremities, joint stability, 5/5 strength, and normal gait. Skin: Warm and dry without rashes, lesions, cyanosis, clubbing or  ecchymosis.  Neuro: Cranial nerves intact, reflexes equal bilaterally. Normal muscle tone, no cerebellar symptoms. Sensation intact.  Pysch: Alert and oriented X 3, normal affect, Insight and Judgment appropriate.   Assessment and Plan  1. Labile hypertension  - EKG 12-Lead - Urinalysis, Routine w reflex microscopic - Microalbumin / creatinine urine ratio - Magnesium - TSH  2. Hyperlipidemia, mixed  - EKG  12-Lead - Lipid panel - TSH  3. Abnormal glucose  - Hemoglobin A1c - Insulin, random  4. Vitamin D deficiency  - VITAMIN D 25 Hydroxyl  5. Prediabetes  - Hemoglobin A1c - Insulin, random  6. Screening for colorectal cancer  - POC Hemoccult Bld/Stl  7. Screening for ischemic heart disease  - EKG 12-Lead  8. Coronary atherosclerosis due to lipid rich plaque  - EKG 12-Lead  9. Former smoker  - EKG 12-Lead  10. FHx: heart disease  - EKG 12-Lead  11. Medication management  - Urinalysis, Routine w reflex microscopic - Microalbumin / creatinine urine ratio - Magnesium - Lipid panel - TSH - Hemoglobin A1c - Insulin, random - VITAMIN D 25 Hydroxyl             Patient was counseled in prudent diet to achieve/maintain BMI less than 25 for weight control, BP monitoring, regular exercise and medications. Discussed med's effects and SE's. Screening labs and tests as requested with regular follow-up as recommended. Over 40 minutes of exam, counseling, chart review and high complex critical decision making was performed.

## 2017-11-26 LAB — VITAMIN D 25 HYDROXY (VIT D DEFICIENCY, FRACTURES): VIT D 25 HYDROXY: 74 ng/mL (ref 30–100)

## 2017-11-26 LAB — LIPID PANEL
CHOLESTEROL: 184 mg/dL (ref <200)
HDL: 55 mg/dL (ref >50)
LDL Cholesterol (Calc): 109 mg/dL (calc) — ABNORMAL HIGH
Non-HDL Cholesterol (Calc): 129 mg/dL (calc) (ref <130)
Total CHOL/HDL Ratio: 3.3 (calc) (ref <5.0)
Triglycerides: 100 mg/dL (ref <150)

## 2017-11-26 LAB — URINALYSIS, ROUTINE W REFLEX MICROSCOPIC
BACTERIA UA: NONE SEEN /HPF
Bilirubin Urine: NEGATIVE
Glucose, UA: NEGATIVE
HGB URINE DIPSTICK: NEGATIVE
HYALINE CAST: NONE SEEN /LPF
Ketones, ur: NEGATIVE
NITRITE: NEGATIVE
PH: 7 (ref 5.0–8.0)
PROTEIN: NEGATIVE
RBC / HPF: NONE SEEN /HPF (ref 0–2)
Specific Gravity, Urine: 1.016 (ref 1.001–1.03)
Squamous Epithelial / LPF: NONE SEEN /HPF (ref <=5)

## 2017-11-26 LAB — HEMOGLOBIN A1C
HEMOGLOBIN A1C: 5.1 %{Hb} (ref <5.7)
MEAN PLASMA GLUCOSE: 100 (calc)
eAG (mmol/L): 5.5 (calc)

## 2017-11-26 LAB — INSULIN, RANDOM: INSULIN: 41.7 u[IU]/mL — AB (ref 2.0–19.6)

## 2017-11-26 LAB — MICROALBUMIN / CREATININE URINE RATIO
Creatinine, Urine: 67 mg/dL (ref 20–275)
MICROALB UR: 0.7 mg/dL
MICROALB/CREAT RATIO: 10 ug/mg{creat} (ref <30)

## 2017-11-26 LAB — TSH: TSH: 1.43 mIU/L (ref 0.40–4.50)

## 2017-11-26 LAB — MAGNESIUM: MAGNESIUM: 1.9 mg/dL (ref 1.5–2.5)

## 2017-11-30 NOTE — Progress Notes (Deleted)
[facility]  [phone] Fax:([phone]) [phone]   ID: April Rosales DOB: 11-08-1940  MR#: [id number]  URK#:[id number]  Patient Care Team: [name], MD as PCP - General (Internal Medicine) Magrinat, [name], MD as Consulting Physician (Oncology) [name], MD as Consulting Physician (Orthopedic Surgery) [name], MD as Consulting Physician (General Surgery) PCP: [name], MD  OTHER: [name], DDS  CHIEF COMPLAINT: Stage IV estrogen receptor positive breast cancer  CURRENT TREATMENT:   Fulvestrant, denosumab/Xgeva  INTERVAL HISTORY: April Rosales return s  today for follow-up and treatment of her estrogen receptor positive breast cancer accompanied by a friend. Since discontinuing the palbociclib, she is feeling better. She has is less fatigued and has more energy to be active outside her house.   She receives fulvestrant every 4 weeks, with good tolerance.   She also receives denosumab/Xgeva, currently every 8 weeks, with good tolerance. She denies complications or pain wit the shots.   Since her last visit here she had left breast and left axilla ultrasonography, 09/08/2017, showing evidence of continuing response.   REVIEW OF SYSTEMS: April Rosales reports that she will follow up with her dentist, Dr. Randol Kern, in November. She sees him every 9 months, but she was advised to see him in August due to the potential effects of the denosumab. She denies unusual headaches, visual changes, nausea, vomiting, or dizziness. There has been no unusual cough, phlegm production, or pleurisy. This been no change in bowel or bladder habits. She denies unexplained fatigue or unexplained weight loss, bleeding, rash, or fever. A detailed review of systems was otherwise stable.  BREAST CANCER HISTORY: From the original intake note:   April Rosales underwent right lumpectomy in 1985 at [facility] for what sounds like a stage I breast cancer. She  tells me she had more than 30 lymph nodes removed from her right axilla and all of them were clear. She received  adjuvant radiation but no systemic treatment.  The patient had recently refused mammography with the last mammogram I can find dating back to August 2010.  More recently the patient presented with left scapular pain radiating down the left arm.  She was evaluated by Dr. [name], who obtained a chest x-ray showing a possible abnormality at T3. He then set up the patient for an MRI of the thoracic spine performed 08/16/2014.  This showed multiple compression fractures  (a similar picture had been noted on lumbar MRI 10/09/2011 ). However at T3 they noted tumor in the vertebral body extending into the left pedicle and into the lateral epidural space, displacing the cord to the right. There was no evidence of cord compression or cord signal abnormality. There were no other areas of tumor identified in the thoracic spine.         The patient was then referred to Dr. Hal Neer who on 08/24/2014 set April Rosales up for CT scans of the chest, abdomen and pelvis. There was a dense mass in the upper inner quadrant of the left breast measuring 1.6 cm. There was a 1.2 cm nodule in the minor fissure of the right lung and some evidence of right lung fibrosis at the site of the prior radiation port.  There was also a thyroid mass measuring 2 cm. However there were no parenchymal lung or liver lesions. Incidental meningoceles were noted as well as sclerosis of the fifth and sixth ribs which were felt to be likely posttraumatic.   On 08/29/2014 the patient underwent bilateral diagnostic mammography  with tomosynthesis and left breast ultrasonography.  There were postsurgical changes in the upper right breast. In the left breast there was an irregular mass measuring 2.3 cm in the upper inner quadrant. This was palpable. Ultrasound showed this to be hypoechoic and to measure 2.0 cm. There were adjacent areas of nodularity.  There was no definite lymphadenopathy in the left axilla.  Biopsy of this breast mass 08/29/2014 showed an invasive adenocarcinoma with both ductal and lobular features (there was strong diffuse E-cadherin expression as well as areas with total absence of E-cadherin expression), with the preliminary prognostic profile showing strong estrogen positivity, very weak to near absent progesterone positivity, an MIB-1 of approximately 40%, and HER-2 equivocal  The patient's subsequent history is as detailed below.   PAST MEDICAL HISTORY: Past Medical History:  Diagnosis Date  . Arthritis   . Breast cancer [facility]) 1985/2016   takes [name] daily  . Chronic back pain    stenosis/listhesis  . Family history of ovarian cancer   . Osteoporosis    takes Vit D  . Radiation 11/23/14-12/11/14   30 Gy T1-T5     PAST SURGICAL HISTORY: Past Surgical History:  Procedure Laterality Date  . ABDOMINAL HYSTERECTOMY     1980  . APPENDECTOMY  1977  . APPLICATION OF INTRAOPERATIVE CT SCAN N/A 10/20/2014   Procedure: APPLICATION OF INTRAOPERATIVE CAT SCAN;  Surgeon: [name], MD;  Location: [facility];  Service: Neurosurgery;  Laterality: N/A;  . BREAST SURGERY Right 1985  . THYROID CYST EXCISION  1967    FAMILY HISTORY Family History  Problem Relation Age of Onset  . Heart disease Mother   . Hypertension Mother   . Heart disease Father   . Diabetes Father   . Breast cancer Paternal Aunt        4 paternal aunts with breast cancer over 59  . Prostate cancer Paternal Uncle   . Stroke Paternal Grandfather   . Ovarian cancer Paternal Aunt   . Huntington's disease Other        Nephew, inherited from his father   The patient's father died from a heart attack at the age of 8. He had 9 sisters. 3 of those sisters had breast cancer, all in a menopausal setting. Another sister had ovarian cancer. One of the paternal uncles had cancer of the colon "and back".  The patient's mother died at the age of 60.  She was found to have breast cancer shortly before dying , during her final hospitalization.  GYNECOLOGIC HISTORY:  No LMP recorded. Patient has had a hysterectomy.  Menarche age 1, first live birth age 51, the patient is GX P1. She underwent hysterectomy in 1980. She thinks the ovaries were removed, but the CT scan obtained 08/24/2014 showed a definite right ovary. The left ovary may have been removed. She did not take hormone replacement after the hysterectomy.  SOCIAL HISTORY:   [place] worked in Psychiatric nurse. She is divorced, lives alone, with 2 cats. Her son Bethann Berkshire lives in [place] where he works in Engineer, technical sales. He has 4 children of his own. The patient is a Psychologist, forensic.    ADVANCED DIRECTIVES:  In place. The patient has named her sister Pleas Koch [phone]) and her friend Janina Mayo [phone]) as joint healthcare powers of attorney   HEALTH MAINTENANCE: Social History   Tobacco Use  . Smoking status: Former Smoker    Packs/day: 0.50    Years: 10.00    Pack years: 5.00  Types: Cigarettes  . Smokeless tobacco: Former Systems developer  . Tobacco comment: quit smoking 1970's  Substance Use Topics  . Alcohol use: Yes    Alcohol/week: 0.0 oz    Comment: Rare  . Drug use: No     Colonoscopy: never  PAP: status post hysterectomy  Bone density: 08/14/2014 at [facility], results pending  Lipid panel:  Allergies  Allergen Reactions  . Ativan [Lorazepam]     Made her crazy  . Dilaudid [Hydromorphone Hcl]     Doesn't want  . Haldol [Haloperidol Lactate]     Made her crazy    Current Outpatient Medications  Medication Sig Dispense Refill  . Ascorbic Acid (VITAMIN C PO) Take 1 tablet by mouth every other day. Takes qod    . aspirin EC 325 MG tablet Take 1 tablet (325 mg total) by mouth daily. 30 tablet 0  . Cholecalciferol (VITAMIN D3 ADULT GUMMIES PO) Take 2,000 Units by mouth daily.     . Cyanocobalamin (VITAMIN B-12 PO) Take by mouth daily. Takes every other day     . gabapentin (NEURONTIN) 100 MG capsule Take 1 capsule (100 mg total) by mouth at bedtime. 90 capsule 4  . Iron-Vitamins (GERITOL PO) Take by mouth daily.     No current facility-administered medications for this visit.     OBJECTIVE:  Older white woman in no acute distress  There were no vitals filed for this visit.   There is no height or weight on file to calculate BMI.    ECOG FS:1 - Symptomatic but completely ambulatory  Sclerae unicteric, EOMs intact Oropharynx clear and moist No cervical or supraclavicular adenopathy Lungs no rales or rhonchi Heart regular rate and rhythm Abd soft, nontender, positive bowel sounds MSK no focal spinal tenderness, no upper extremity lymphedema Neuro: nonfocal, well oriented, appropriate affect Breasts: The right breast is status post remote surgery and radiation with no evidence of disease recurrence.  Left breast is unremarkable and I do not palpate a mass or node any skin or nipple change of concern..  Both axillae are benign  LAB RESULTS:  CMP     Component Value Date/Time   NA 140 11/13/2017 1101   NA 141 05/29/2017 1417   K 4.6 11/13/2017 1101   K 3.6 05/29/2017 1417   CL 105 11/13/2017 1101   CO2 29 11/13/2017 1101   CO2 27 05/29/2017 1417   GLUCOSE 80 11/13/2017 1101   GLUCOSE 111 05/29/2017 1417   BUN 17 11/13/2017 1101   BUN 17.0 05/29/2017 1417   CREATININE 0.70 11/13/2017 1101   CREATININE 0.8 05/29/2017 1417   CALCIUM 10.4 11/13/2017 1101   CALCIUM 10.7 (H) 06/26/2017 1216   CALCIUM 10.4 05/29/2017 1417   PROT 6.9 11/13/2017 1101   PROT 7.3 05/29/2017 1417   ALBUMIN 3.8 11/13/2017 1101   ALBUMIN 3.9 05/29/2017 1417   AST 19 11/13/2017 1101   AST 17 05/29/2017 1417   ALT 11 11/13/2017 1101   ALT 15 05/29/2017 1417   ALKPHOS 38 (L) 11/13/2017 1101   ALKPHOS 42 05/29/2017 1417   BILITOT 0.3 11/13/2017 1101   BILITOT 0.30 05/29/2017 1417   GFRNONAA >60 11/13/2017 1101   GFRNONAA 85 07/07/2016 1125   GFRAA >60  11/13/2017 1101   GFRAA >89 07/07/2016 1125    INo results found for: SPEP, UPEP  Lab Results  Component Value Date   WBC 4.7 11/13/2017   NEUTROABS 2.9 11/13/2017   HGB 14.5 11/13/2017   HCT 42.1 11/13/2017  MCV 93.8 11/13/2017   PLT 200 11/13/2017      Chemistry      Component Value Date/Time   NA 140 11/13/2017 1101   NA 141 05/29/2017 1417   K 4.6 11/13/2017 1101   K 3.6 05/29/2017 1417   CL 105 11/13/2017 1101   CO2 29 11/13/2017 1101   CO2 27 05/29/2017 1417   BUN 17 11/13/2017 1101   BUN 17.0 05/29/2017 1417   CREATININE 0.70 11/13/2017 1101   CREATININE 0.8 05/29/2017 1417      Component Value Date/Time   CALCIUM 10.4 11/13/2017 1101   CALCIUM 10.7 (H) 06/26/2017 1216   CALCIUM 10.4 05/29/2017 1417   ALKPHOS 38 (L) 11/13/2017 1101   ALKPHOS 42 05/29/2017 1417   AST 19 11/13/2017 1101   AST 17 05/29/2017 1417   ALT 11 11/13/2017 1101   ALT 15 05/29/2017 1417   BILITOT 0.3 11/13/2017 1101   BILITOT 0.30 05/29/2017 1417       Lab Results  Component Value Date   LABCA2 24 01/01/2016    No components found for: [id number]  No results for input(s): INR in the last 168 hours.  Urinalysis    Component Value Date/Time   COLORURINE YELLOW 11/25/2017 1143   APPEARANCEUR CLEAR 11/25/2017 1143   LABSPEC 1.016 11/25/2017 1143   PHURINE 7.0 11/25/2017 1143   GLUCOSEU NEGATIVE 11/25/2017 1143   HGBUR NEGATIVE 11/25/2017 1143   BILIRUBINUR NEGATIVE 09/19/2015 1239   KETONESUR NEGATIVE 11/25/2017 1143   PROTEINUR NEGATIVE 11/25/2017 1143   NITRITE NEGATIVE 11/25/2017 1143   LEUKOCYTESUR 1+ (A) 11/25/2017 1143    STUDIES: No results found.  ASSESSMENT: 77 y.o. [place] woman with stage IV left breast cancer involving bone  (1) status post right lumpectomy and axillary node dissection in 1985 followed by radiation at [place] 08/16/2014: measurable disease in spine, lung and left breast   (2) evaluation for left shoulder pain led to  thoracic spine MRI 08/16/2014 showing a pathologic fracture at T3 with epidural tumor displacing the cord to the right, but no cord compression. CT scans of the chest, abdomen and pelvis 08/24/2014 showed in addition a mass in the upper outer quadrant left breast measuring 1.6 cm and a nodule in the minor fissure of the right lung measuring 1.2 cm, but no parenchymal lung or liver lesions.   (a) CA 27-29 was noninformative at 38 (09/20/2014)    (3) mammography and ultrasonography 08/29/2014 show a mass in the upper inner left breast which was palpable,  measuring 2.0 cm by ultrasound. Biopsy of this mass 08/29/2014 showed an invasive breast cancer with both lobular and ductal features, estrogen receptor positive, progesterone receptor weakly positive, with an MIB-1 in the 40% range, HER-2 equivocal (6 else ratio 1.5, but average number her nucleus 5.8)    (4) letrozole started 08/31/2014;   (a) palbociclib added Sept 2016 at 75 mg 21/7, with significant neutropenia; not repeated after first cycle  (b) letrozole discontinued 05/29/2017 with evidence of disease progression in the breast   (5)  zolendronate started 09/20/2014, stopped after initial dose due to poor tolerance  (a) denosumab/ Xgeva started 01/31/2015, repeated every 4 weeks  (b) changed to every 8 weeks as of August 2018.    (6) on 10/20/2014 the patient underwent T2-T3 and T4 decompressive laminectomy with removal of epidural tumor, C7-T4 segmental pedicle screw instrumentation with virage screw system with arrow guidance protocol and C7-T4 posterolateral fusion. The cells were positive for the estrogen  receptor. HER-2/neu testing by [facility] showed again equivocal results,  (a) most recent bone scan 10/01/2016 finds no new lesions only postoperative changes  (7) radiation 11/23/2014-12/11/2014.  (a) T1-T5 was treated to 30 Gy in 12 fractions at 2.5 Gy per fraction   (8)  consider eventual left lumpectomy or mastectomy depending on   longer-term results of systemic therapy  (a) most recent left breast ultrasonography 10/30/2016 found this mass to measure 0.7 cm  (b) repeat left breast ultrasonography 05/13/2017 showed the upper outer quadrant mass to have grown to 1.4 cm  (c) left breast ultrasound 09/08/2017 shows the mass to now measure 0.9 cm  (9) genetics testing using the Breast/Ovarian Cancer Panel through [facility] [name], MD) found no deleterious mutations in ATM, BARD1, BRCA1, BRCA2, BRIP1, CDH1, CHEK2, EPCAM, FANCC, MLH1, MSH2, MSH6, NBN, PALB2, PMS2, PTEN, RAD51C, RAD51D, STK11, TP53, or XRCC2    (10) right thyroid nodule is a complex cyst as noted on CT scan of the neck 01/29/2016  (11) osteoporosis: Bone density at [facility] 09/12/2016 showed a T score of -4.8.  (a) continue denosumab/Xgeva as above  (12) fulvestrant started 05/29/2017  (a) started palbociclib 06/29/2017 at 75 mg every other day for 21 days on, 7 days off  (b) palbociclib discontinued on 3/29 due to progressive fatigue and patient preference  (13) considering left lumpectomy  PLAN: I spent approximately 30 minutes with April Rosales with most of that time spent discussing her complex problems.  She continues to wish to avoid surgery.  We reviewed the results of the left breast ultrasound in detail.  The mass has decreased from 1.4 to 0.9 cm.  She was on palbociclib until a month ago and that may have contributed to that response.  Daisie is feeling better off the palbociclib.  She was taking the lowest documented dose to work, namely 75 mg every other day.  There were no overwhelming symptoms just that she did not feel good.  She now feels better although she does not report any significant change in her functional status, which is limited.  She is tolerating the fulvestrant and the denosumab/Xgeva with no side effects.  We are continuing the Xgeva currently every 8 weeks.  When she sees me again in July we will consider going to every 12 weeks.  I  have alerted her to possible dental problems and suggested she see her dentist Dr. Randol Kern every 6 months instead of every 9 months.  We will repeat an ultrasound of the left breast before her July visit.  If there has been any evidence of growth we will consider going back to palbociclib or considering abemaciclib which she might just tolerate a little bit better  She knows to call for any other issues that may develop before the next visit.  [name] Magrinat MD   11/30/17 2:43 PM Medical Oncology and Hematology [facility] [address] [place], [place] [address] Tel. [phone]    Fax. [phone]  This document serves as a record of services personally performed by Lurline Del, MD. It was created on his behalf by Sheron Nightingale, a trained medical scribe. The creation of this record is based on the scribe's personal observations and the provider's statements to them.   I have reviewed the above documentation for accuracy and completeness, and I agree with the above.

## 2017-12-08 ENCOUNTER — Other Ambulatory Visit: Payer: Self-pay | Admitting: Oncology

## 2017-12-08 ENCOUNTER — Ambulatory Visit
Admission: RE | Admit: 2017-12-08 | Discharge: 2017-12-08 | Disposition: A | Discharge status: Home / Self Care | Payer: Medicare Other | Source: Ambulatory Visit | Attending: Oncology | Admitting: Oncology

## 2017-12-08 DIAGNOSIS — R922 Inconclusive mammogram: Secondary | ICD-10-CM | POA: Diagnosis not present

## 2017-12-08 DIAGNOSIS — C50919 Malignant neoplasm of unspecified site of unspecified female breast: Secondary | ICD-10-CM

## 2017-12-08 DIAGNOSIS — C50912 Malignant neoplasm of unspecified site of left female breast: Secondary | ICD-10-CM | POA: Diagnosis not present

## 2017-12-08 DIAGNOSIS — R921 Mammographic calcification found on diagnostic imaging of breast: Secondary | ICD-10-CM

## 2017-12-08 DIAGNOSIS — Z853 Personal history of malignant neoplasm of breast: Secondary | ICD-10-CM

## 2017-12-08 DIAGNOSIS — N6489 Other specified disorders of breast: Secondary | ICD-10-CM | POA: Diagnosis not present

## 2017-12-08 NOTE — Progress Notes (Signed)
[place]  Telephone:([phone]) [phone] Fax:(336) [phone]   ID: April Rosales DOB: 1940-10-22  MR#: [id number]  KCM#:[id number]  Patient Care Team: April Pinto, MD as PCP - General (Internal Medicine) April Rosales, April Dad, MD as Consulting Physician (Oncology) April Killings, MD as Consulting Physician (Orthopedic Surgery) April Skates, MD as Consulting Physician (General Surgery) PCP: April Pinto, MD  OTHER: April Rosales, DDS  CHIEF COMPLAINT: Stage IV estrogen receptor positive breast cancer  CURRENT TREATMENT:   Fulvestrant, denosumab/Xgeva  INTERVAL HISTORY: April Rosales returns today for follow-up and treatment of her estrogen receptor positive breast cancer accompanied by a friend. She receives fulvestrant every 4 weeks, with good tolerance.   She also receives denosumab/Xgeva every 8 weeks. She tolerates this well.   Since her last visit, she underwent diagnostic bilateral mammography with CAD and tomography and left breast ultrasonography on 12/08/2017 at [facility] showing: breast density category C. The known breast cancer mass in the medial aspect of the left breast is unchanged. Additionally, there are 2 new adjacent groups of calcifications in the upper inner quadrant of the left breast.    REVIEW OF SYSTEMS: April Rosales reports that she is concerned about her mammogram results. She denies unusual headaches, visual changes, nausea, vomiting, or dizziness. There has been no unusual cough, phlegm production, or pleurisy. This been no change in bowel or bladder habits. She denies unexplained fatigue or unexplained weight loss, bleeding, rash, or fever. A detailed review of systems was otherwise stable.    BREAST CANCER HISTORY: From the original intake note:   April Rosales underwent right lumpectomy in 1985 at [facility] for what sounds like a stage I breast cancer. She tells me she had more than 30 lymph nodes removed from her right axilla  and all of them were clear. She received  adjuvant radiation but no systemic treatment.  The patient had recently refused mammography with the last mammogram I can find dating back to August 2010.  More recently the patient presented with left scapular pain radiating down the left arm.  She was evaluated by Dr. Lorin Rosales, who obtained a chest x-ray showing a possible abnormality at T3. He then set up the patient for an MRI of the thoracic spine performed 08/16/2014.  This showed multiple compression fractures  (a similar picture had been noted on lumbar MRI 10/09/2011 ). However at T3 they noted tumor in the vertebral body extending into the left pedicle and into the lateral epidural space, displacing the cord to the right. There was no evidence of cord compression or cord signal abnormality. There were no other areas of tumor identified in the thoracic spine.         The patient was then referred to Dr. Hal Rosales who on 08/24/2014 set April Rosales up for CT scans of the chest, abdomen and pelvis. There was a dense mass in the upper inner quadrant of the left breast measuring 1.6 cm. There was a 1.2 cm nodule in the minor fissure of the right lung and some evidence of right lung fibrosis at the site of the prior radiation port.  There was also a thyroid mass measuring 2 cm. However there were no parenchymal lung or liver lesions. Incidental meningoceles were noted as well as sclerosis of the fifth and sixth ribs which were felt to be likely posttraumatic.   On 08/29/2014 the patient underwent bilateral diagnostic mammography with tomosynthesis and left breast ultrasonography.  There were postsurgical changes in the upper right breast. In  the left breast there was an irregular mass measuring 2.3 cm in the upper inner quadrant. This was palpable. Ultrasound showed this to be hypoechoic and to measure 2.0 cm. There were adjacent areas of nodularity. There was no definite lymphadenopathy in the left axilla.  Biopsy of  this breast mass 08/29/2014 showed an invasive adenocarcinoma with both ductal and lobular features (there was strong diffuse E-cadherin expression as well as areas with total absence of E-cadherin expression), with the preliminary prognostic profile showing strong estrogen positivity, very weak to near absent progesterone positivity, an MIB-1 of approximately 40%, and HER-2 equivocal  The patient's subsequent history is as detailed below.   PAST MEDICAL HISTORY: Past Medical History:  Diagnosis Date  . Arthritis   . Breast cancer [facility]) 1985/2016   takes [name] daily  . Chronic back pain    stenosis/listhesis  . Family history of ovarian cancer   . Osteoporosis    takes Vit D  . Radiation 11/23/14-12/11/14   30 Gy T1-T5     PAST SURGICAL HISTORY: Past Surgical History:  Procedure Laterality Date  . ABDOMINAL HYSTERECTOMY     1980  . APPENDECTOMY  1977  . APPLICATION OF INTRAOPERATIVE CT SCAN N/A 10/20/2014   Procedure: APPLICATION OF INTRAOPERATIVE CAT SCAN;  Surgeon: April Chimera, MD;  Location: [place] [facility];  Service: Neurosurgery;  Laterality: N/A;  . BREAST SURGERY Right 1985  . THYROID CYST EXCISION  1967    FAMILY HISTORY Family History  Problem Relation Age of Onset  . Heart disease Mother   . Hypertension Mother   . Heart disease Father   . Diabetes Father   . Breast cancer Paternal Aunt        4 paternal aunts with breast cancer over 25  . Prostate cancer Paternal Uncle   . Stroke Paternal Grandfather   . Ovarian cancer Paternal Aunt   . Huntington's disease Other        Nephew, inherited from his father   The patient's father died from a heart attack at the age of 16. He had 9 sisters. 3 of those sisters had breast cancer, all in a menopausal setting. Another sister had ovarian cancer. One of the paternal uncles had cancer of the colon "and back".  The patient's mother died at the age of 46. She was found to have breast cancer shortly before dying , during her  final hospitalization.  GYNECOLOGIC HISTORY:  No LMP recorded. Patient has had a hysterectomy.  Menarche age 58, first live birth age 60, the patient is GX P1. She underwent hysterectomy in 1980. She thinks the ovaries were removed, but the CT scan obtained 08/24/2014 showed a definite right ovary. The left ovary may have been removed. She did not take hormone replacement after the hysterectomy.  SOCIAL HISTORY:   Jayelle worked in Psychiatric nurse. She is divorced, lives alone, with 2 cats. Her son Bethann Berkshire lives in [place] where he works in Engineer, technical sales. He has 4 children of his own. The patient is a Psychologist, forensic.    ADVANCED DIRECTIVES:  In place. The patient has named her sister Pleas Koch [phone]) and her friend Janina Mayo [phone]) as joint healthcare powers of attorney   HEALTH MAINTENANCE: Social History   Tobacco Use  . Smoking status: Former Smoker    Packs/day: 0.50    Years: 10.00    Pack years: 5.00    Types: Cigarettes  . Smokeless tobacco: Former Systems developer  . Tobacco comment: quit smoking 1970's  Substance  Use Topics  . Alcohol use: Yes    Alcohol/week: 0.0 oz    Comment: Rare  . Drug use: No     Colonoscopy: never  PAP: status post hysterectomy  Bone density: 09/12/2016 [facility]/ T score of -4.8 osteoporosis  Lipid panel:  Allergies  Allergen Reactions  . Ativan [Lorazepam]     Made her crazy  . Dilaudid [Hydromorphone Hcl]     Doesn't want  . Haldol [Haloperidol Lactate]     Made her crazy    Current Outpatient Medications  Medication Sig Dispense Refill  . Ascorbic Acid (VITAMIN C PO) Take 1 tablet by mouth every other day. Takes qod    . aspirin EC 325 MG tablet Take 1 tablet (325 mg total) by mouth daily. 30 tablet 0  . Cholecalciferol (VITAMIN D3 ADULT GUMMIES PO) Take 2,000 Units by mouth daily.     . Cyanocobalamin (VITAMIN B-12 PO) Take by mouth daily. Takes every other day    . gabapentin (NEURONTIN) 100 MG capsule Take 1 capsule  (100 mg total) by mouth at bedtime. 90 capsule 4  . Iron-Vitamins (GERITOL PO) Take by mouth daily.     No current facility-administered medications for this visit.     OBJECTIVE:  Older white woman who appears stated age  36:   12/09/17 1115  BP: (!) 147/77  Pulse: 86  Resp: 17  Temp: 99.9 F (37.7 C)  SpO2: 98%     Body mass index is 23.01 kg/m.    ECOG FS:1 - Symptomatic but completely ambulatory  Sclerae unicteric, pupils round and equal Oropharynx clear and moist No cervical or supraclavicular adenopathy Lungs no rales or rhonchi Heart regular rate and rhythm Abd soft, nontender, positive bowel sounds MSK no focal spinal tenderness, no upper extremity lymphedema Neuro: nonfocal, well oriented, appropriate affect Breasts: The right breast is status post surgery and radiation.  There is no evidence of local recurrence.  The left breast she has no palpable mass and there are no skin or nipple changes of concern.  Both axillae are benign.  LAB RESULTS:  CMP     Component Value Date/Time   NA 140 11/13/2017 1101   NA 141 05/29/2017 1417   K 4.6 11/13/2017 1101   K 3.6 05/29/2017 1417   CL 105 11/13/2017 1101   CO2 29 11/13/2017 1101   CO2 27 05/29/2017 1417   GLUCOSE 80 11/13/2017 1101   GLUCOSE 111 05/29/2017 1417   BUN 17 11/13/2017 1101   BUN 17.0 05/29/2017 1417   CREATININE 0.70 11/13/2017 1101   CREATININE 0.8 05/29/2017 1417   CALCIUM 10.4 11/13/2017 1101   CALCIUM 10.7 (H) 06/26/2017 1216   CALCIUM 10.4 05/29/2017 1417   PROT 6.9 11/13/2017 1101   PROT 7.3 05/29/2017 1417   ALBUMIN 3.8 11/13/2017 1101   ALBUMIN 3.9 05/29/2017 1417   AST 19 11/13/2017 1101   AST 17 05/29/2017 1417   ALT 11 11/13/2017 1101   ALT 15 05/29/2017 1417   ALKPHOS 38 (L) 11/13/2017 1101   ALKPHOS 42 05/29/2017 1417   BILITOT 0.3 11/13/2017 1101   BILITOT 0.30 05/29/2017 1417   GFRNONAA >60 11/13/2017 1101   GFRNONAA 85 07/07/2016 1125   GFRAA >60 11/13/2017 1101    GFRAA >89 07/07/2016 1125    INo results found for: SPEP, UPEP  Lab Results  Component Value Date   WBC 4.8 12/09/2017   NEUTROABS 2.7 12/09/2017   HGB 14.5 12/09/2017   HCT 42.7 12/09/2017  MCV 92.4 12/09/2017   PLT 225 12/09/2017      Chemistry      Component Value Date/Time   NA 140 11/13/2017 1101   NA 141 05/29/2017 1417   K 4.6 11/13/2017 1101   K 3.6 05/29/2017 1417   CL 105 11/13/2017 1101   CO2 29 11/13/2017 1101   CO2 27 05/29/2017 1417   BUN 17 11/13/2017 1101   BUN 17.0 05/29/2017 1417   CREATININE 0.70 11/13/2017 1101   CREATININE 0.8 05/29/2017 1417      Component Value Date/Time   CALCIUM 10.4 11/13/2017 1101   CALCIUM 10.7 (H) 06/26/2017 1216   CALCIUM 10.4 05/29/2017 1417   ALKPHOS 38 (L) 11/13/2017 1101   ALKPHOS 42 05/29/2017 1417   AST 19 11/13/2017 1101   AST 17 05/29/2017 1417   ALT 11 11/13/2017 1101   ALT 15 05/29/2017 1417   BILITOT 0.3 11/13/2017 1101   BILITOT 0.30 05/29/2017 1417       Lab Results  Component Value Date   LABCA2 24 01/01/2016    No components found for: [id number]  No results for input(s): INR in the last 168 hours.  Urinalysis    Component Value Date/Time   COLORURINE YELLOW 11/25/2017 1143   APPEARANCEUR CLEAR 11/25/2017 1143   LABSPEC 1.016 11/25/2017 1143   PHURINE 7.0 11/25/2017 1143   GLUCOSEU NEGATIVE 11/25/2017 1143   HGBUR NEGATIVE 11/25/2017 1143   BILIRUBINUR NEGATIVE 09/19/2015 1239   KETONESUR NEGATIVE 11/25/2017 1143   PROTEINUR NEGATIVE 11/25/2017 1143   NITRITE NEGATIVE 11/25/2017 1143   LEUKOCYTESUR 1+ (A) 11/25/2017 1143    STUDIES: US Breast Ltd Uni Left Inc Axilla  Result Date: 12/08/2017 CLINICAL DATA:  Patient with history of left breast cancer. Follow-up exam. EXAM: DIGITAL DIAGNOSTIC BILATERAL MAMMOGRAM WITH CAD AND TOMO ULTRASOUND LEFT BREAST COMPARISON:  Previous exam(s). ACR Breast Density Category c: The breast tissue is heterogeneously dense, which may obscure small  masses. FINDINGS: Grossly unchanged mass within the medial aspect of the left breast with associated biopsy marking clip. Stable postsurgical changes right breast. Additionally within the upper inner left breast posterior depth there are 2 new adjacent groups of calcifications each measuring 3 mm in size. These calcifications are separated by a distance of approximately 1 cm. These were further evaluated with magnification views. Mammographic images were processed with CAD. Targeted ultrasound is performed, showing grossly unchanged lobular hypoechoic mass left breast 10 o'clock position 5 cm from the nipple measuring 6 x 9 x 3 mm, previously 6 x 9 x 3 mm. IMPRESSION: 1. Interval development of 2 closely approximated groups of calcifications within the upper inner left breast middle depth. 2. Grossly unchanged known left breast cancer 10 o'clock position. RECOMMENDATION: 1. Stereotactic guided core needle biopsy of both groups of calcifications within the upper inner left breast. Patient would like to discuss these findings with Dr. Jana Hakim prior to deciding on biopsy. 2. Treatment plan for known left breast malignancy. I have discussed the findings and recommendations with the patient. Results were also provided in writing at the conclusion of the visit. If applicable, a reminder letter will be sent to the patient regarding the next appointment. BI-RADS CATEGORY  4: Suspicious. Electronically Signed   By: Lovey Newcomer M.D.   On: 12/08/2017 16:00   Mm Diag Breast Tomo Bilateral  Result Date: 12/08/2017 CLINICAL DATA:  Patient with history of left breast cancer. Follow-up exam. EXAM: DIGITAL DIAGNOSTIC BILATERAL MAMMOGRAM WITH CAD AND TOMO ULTRASOUND LEFT BREAST COMPARISON:  Previous exam(s). ACR Breast Density Category c: The breast tissue is heterogeneously dense, which may obscure small masses. FINDINGS: Grossly unchanged mass within the medial aspect of the left breast with associated biopsy marking clip.  Stable postsurgical changes right breast. Additionally within the upper inner left breast posterior depth there are 2 new adjacent groups of calcifications each measuring 3 mm in size. These calcifications are separated by a distance of approximately 1 cm. These were further evaluated with magnification views. Mammographic images were processed with CAD. Targeted ultrasound is performed, showing grossly unchanged lobular hypoechoic mass left breast 10 o'clock position 5 cm from the nipple measuring 6 x 9 x 3 mm, previously 6 x 9 x 3 mm. IMPRESSION: 1. Interval development of 2 closely approximated groups of calcifications within the upper inner left breast middle depth. 2. Grossly unchanged known left breast cancer 10 o'clock position. RECOMMENDATION: 1. Stereotactic guided core needle biopsy of both groups of calcifications within the upper inner left breast. Patient would like to discuss these findings with Dr. Jana Hakim prior to deciding on biopsy. 2. Treatment plan for known left breast malignancy. I have discussed the findings and recommendations with the patient. Results were also provided in writing at the conclusion of the visit. If applicable, a reminder letter will be sent to the patient regarding the next appointment. BI-RADS CATEGORY  4: Suspicious. Electronically Signed   By: Lovey Newcomer M.D.   On: 12/08/2017 16:00    ASSESSMENT: 77 y.o. [place] woman with stage IV left breast cancer involving bone  (1) status post right lumpectomy and axillary node dissection in 1985 followed by radiation at [facility] 08/16/2014: measurable disease in spine, lung and left breast   (2) evaluation for left shoulder pain led to thoracic spine MRI 08/16/2014 showing a pathologic fracture at T3 with epidural tumor displacing the cord to the right, but no cord compression. CT scans of the chest, abdomen and pelvis 08/24/2014 showed in addition a mass in the upper outer quadrant left breast measuring  1.6 cm and a nodule in the minor fissure of the right lung measuring 1.2 cm, but no parenchymal lung or liver lesions.   (a) CA 27-29 was noninformative at 38 (09/20/2014)    (3) mammography and ultrasonography 08/29/2014 show a mass in the upper inner left breast which was palpable,  measuring 2.0 cm by ultrasound. Biopsy of this mass 08/29/2014 showed an invasive breast cancer with both lobular and ductal features, estrogen receptor positive, progesterone receptor weakly positive, with an MIB-1 in the 40% range, HER-2 equivocal (6 else ratio 1.5, but average number her nucleus 5.8)    (4) letrozole started 08/31/2014;   (a) palbociclib added Sept 2016 at 75 mg 21/7, with significant neutropenia; not repeated after first cycle  (b) letrozole discontinued 05/29/2017 with evidence of disease progression in the breast   (5)  zolendronate started 09/20/2014, stopped after initial dose due to poor tolerance  (a) denosumab/ Xgeva started 01/31/2015, repeated every 4 weeks  (b) changed to every 8 weeks as of August 2018.    (6) on 10/20/2014 the patient underwent T2-T3 and T4 decompressive laminectomy with removal of epidural tumor, C7-T4 segmental pedicle screw instrumentation with virage screw system with arrow guidance protocol and C7-T4 posterolateral fusion. The cells were positive for the estrogen receptor. HER-2/neu testing by [facility] showed again equivocal results,  (a) most recent bone scan 10/01/2016 finds no new lesions only postoperative changes  (7) radiation 11/23/2014-12/11/2014.  (a) T1-T5 was treated  to 30 Gy in 12 fractions at 2.5 Gy per fraction   (8)  consider eventual left lumpectomy or mastectomy depending on  longer-term results of systemic therapy  (a) most recent left breast ultrasonography 10/30/2016 found this mass to measure 0.7 cm  (b) repeat left breast ultrasonography 05/13/2017 showed the upper outer quadrant mass to have grown to 1.4 cm  (c) left breast ultrasound  09/08/2017 shows the mass to now measure 0.9 cm  (9) genetics testing using the Breast/Ovarian Cancer Panel through [facility] [name], MD) found no deleterious mutations in ATM, BARD1, BRCA1, BRCA2, BRIP1, CDH1, CHEK2, EPCAM, FANCC, MLH1, MSH2, MSH6, NBN, PALB2, PMS2, PTEN, RAD51C, RAD51D, STK11, TP53, or XRCC2    (10) right thyroid nodule is a complex cyst as noted on CT scan of the neck 01/29/2016  (11) osteoporosis: Bone density at [facility] 09/12/2016 showed a T score of -4.8.  (a) continue denosumab/Xgeva as above  (12) fulvestrant started 05/29/2017  (a) started palbociclib 06/29/2017 at 75 mg every other day for 21 days on, 7 days off  (b) palbociclib discontinued on 3/29 due to progressive fatigue and patient preference  (13) considering left lumpectomy  (14) 2 new areas of calcification in the left breast noted on mammography July 2019: Biopsy pending  PLAN: I spent approximately 30 minutes with April Rosales with more than 50% of that time spent in counseling and coordination of care.  She was disappointed that her left breast mass has not disappeared.  We discussed the fact that she has stage IV disease, that stage IV disease is not curable, and that the goal of treatment is control not cure.  In fact she is doing terrific, tolerating her treatment without any side effects that she is aware of, with excellent disease control.  She does have 2 new spots in the left breast.  These could be benign fat necrosis or similar, or they could be noninvasive breast cancer or they could be invasive breast cancer.  She does agree to biopsy and that has been set up for later this week.  She would like to have her main mass in the left breast biopsy.  This may give [place] information regarding the proliferation fraction, which I would expect to be lower than at baseline, but I doubt that it would have changed in terms of the prognostic panel.  However we would obtain a full prognostic panel if that biopsy  is done  She had many other questions which we addressed today and which we will address again with more data once she sees me on 01/08/2018, by which time we should have the biopsy results on hand  She knows to call for any other issues that may develop before the next visit.      Keiron Iodice, April Dad, MD  12/09/17 11:33 AM Medical Oncology and Hematology [facility] [address] [place], [place] [address] Tel. [phone]    Fax. [phone]  Alice Rieger, am acting as scribe for Chauncey Cruel MD.  I, Lurline Del MD, have reviewed the above documentation for accuracy and completeness, and I agree with the above.

## 2017-12-09 ENCOUNTER — Telehealth: Payer: Self-pay | Admitting: Oncology

## 2017-12-09 ENCOUNTER — Other Ambulatory Visit: Payer: Medicare Other

## 2017-12-09 ENCOUNTER — Inpatient Hospital Stay: Payer: Medicare Other

## 2017-12-09 ENCOUNTER — Inpatient Hospital Stay (HOSPITAL_BASED_OUTPATIENT_CLINIC_OR_DEPARTMENT_OTHER): Payer: Medicare Other | Admitting: Oncology

## 2017-12-09 ENCOUNTER — Ambulatory Visit: Payer: Medicare Other | Admitting: Oncology

## 2017-12-09 ENCOUNTER — Inpatient Hospital Stay: Payer: Medicare Other | Attending: Oncology

## 2017-12-09 VITALS — BP 147/77 | HR 86 | Temp 99.9°F | Resp 17 | Ht 61.5 in | Wt 123.8 lb

## 2017-12-09 DIAGNOSIS — M810 Age-related osteoporosis without current pathological fracture: Secondary | ICD-10-CM

## 2017-12-09 DIAGNOSIS — N649 Disorder of breast, unspecified: Secondary | ICD-10-CM | POA: Diagnosis not present

## 2017-12-09 DIAGNOSIS — Z5111 Encounter for antineoplastic chemotherapy: Secondary | ICD-10-CM | POA: Diagnosis not present

## 2017-12-09 DIAGNOSIS — C50919 Malignant neoplasm of unspecified site of unspecified female breast: Secondary | ICD-10-CM

## 2017-12-09 DIAGNOSIS — Z170 Estrogen receptor positive status [ER+]: Secondary | ICD-10-CM

## 2017-12-09 DIAGNOSIS — C50412 Malignant neoplasm of upper-outer quadrant of left female breast: Secondary | ICD-10-CM

## 2017-12-09 DIAGNOSIS — C7951 Secondary malignant neoplasm of bone: Secondary | ICD-10-CM | POA: Diagnosis not present

## 2017-12-09 DIAGNOSIS — Z87891 Personal history of nicotine dependence: Secondary | ICD-10-CM

## 2017-12-09 DIAGNOSIS — C50912 Malignant neoplasm of unspecified site of left female breast: Secondary | ICD-10-CM

## 2017-12-09 LAB — COMPREHENSIVE METABOLIC PANEL
ALT: 16 U/L (ref 0–44)
AST: 20 U/L (ref 15–41)
Albumin: 3.8 g/dL (ref 3.5–5.0)
Alkaline Phosphatase: 39 U/L (ref 38–126)
Anion gap: 5 (ref 5–15)
BUN: 14 mg/dL (ref 8–23)
CHLORIDE: 106 mmol/L (ref 98–111)
CO2: 30 mmol/L (ref 22–32)
CREATININE: 0.7 mg/dL (ref 0.44–1.00)
Calcium: 10.4 mg/dL — ABNORMAL HIGH (ref 8.9–10.3)
Glucose, Bld: 71 mg/dL (ref 70–99)
Potassium: 4.9 mmol/L (ref 3.5–5.1)
Sodium: 141 mmol/L (ref 135–145)
Total Bilirubin: 0.3 mg/dL (ref 0.3–1.2)
Total Protein: 6.9 g/dL (ref 6.5–8.1)

## 2017-12-09 LAB — CBC WITH DIFFERENTIAL/PLATELET
BASOS ABS: 0 10*3/uL (ref 0.0–0.1)
BASOS PCT: 1 %
EOS ABS: 0.2 10*3/uL (ref 0.0–0.5)
Eosinophils Relative: 5 %
HCT: 42.7 % (ref 34.8–46.6)
Hemoglobin: 14.5 g/dL (ref 11.6–15.9)
LYMPHS ABS: 1.3 10*3/uL (ref 0.9–3.3)
Lymphocytes Relative: 28 %
MCH: 31.4 pg (ref 25.1–34.0)
MCHC: 34 g/dL (ref 31.5–36.0)
MCV: 92.4 fL (ref 79.5–101.0)
Monocytes Absolute: 0.5 10*3/uL (ref 0.1–0.9)
Monocytes Relative: 11 %
NEUTROS PCT: 55 %
Neutro Abs: 2.7 10*3/uL (ref 1.5–6.5)
Platelets: 225 10*3/uL (ref 145–400)
RBC: 4.62 MIL/uL (ref 3.70–5.45)
RDW: 13.5 % (ref 11.2–14.5)
WBC: 4.8 10*3/uL (ref 3.9–10.3)

## 2017-12-09 MED ORDER — FULVESTRANT 250 MG/5ML IM SOLN
INTRAMUSCULAR | Status: AC
Start: 2017-12-09 — End: ?
  Filled 2017-12-09: qty 10

## 2017-12-09 MED ORDER — DENOSUMAB 120 MG/1.7ML ~~LOC~~ SOLN
120.0000 mg | Freq: Once | SUBCUTANEOUS | Status: AC
  Administered 2017-12-09: 120 mg via SUBCUTANEOUS

## 2017-12-09 MED ORDER — DENOSUMAB 120 MG/1.7ML ~~LOC~~ SOLN
SUBCUTANEOUS | Status: AC
  Filled 2017-12-09: qty 1.7

## 2017-12-09 MED ORDER — FULVESTRANT 250 MG/5ML IM SOLN
500.0000 mg | Freq: Once | INTRAMUSCULAR | Status: AC
  Administered 2017-12-09: 500 mg via INTRAMUSCULAR

## 2017-12-09 NOTE — Telephone Encounter (Signed)
Patient unable to keep lab/inj on 7/16 as she is going away.  Patient asked to come in on 7/15 and see GM on 7/26.

## 2017-12-10 ENCOUNTER — Ambulatory Visit: Payer: Medicare Other | Admitting: Oncology

## 2017-12-11 ENCOUNTER — Other Ambulatory Visit: Payer: Medicare Other

## 2017-12-11 ENCOUNTER — Ambulatory Visit
Admission: RE | Admit: 2017-12-11 | Discharge: 2017-12-11 | Disposition: A | Discharge status: Home / Self Care | Payer: Medicare Other | Source: Ambulatory Visit | Attending: Oncology | Admitting: Oncology

## 2017-12-11 ENCOUNTER — Ambulatory Visit: Payer: Medicare Other

## 2017-12-11 ENCOUNTER — Ambulatory Visit: Payer: Medicare Other | Admitting: Oncology

## 2017-12-11 DIAGNOSIS — R921 Mammographic calcification found on diagnostic imaging of breast: Secondary | ICD-10-CM | POA: Diagnosis not present

## 2017-12-11 DIAGNOSIS — Z853 Personal history of malignant neoplasm of breast: Secondary | ICD-10-CM

## 2017-12-11 DIAGNOSIS — D0512 Intraductal carcinoma in situ of left breast: Secondary | ICD-10-CM | POA: Diagnosis not present

## 2017-12-15 ENCOUNTER — Other Ambulatory Visit: Payer: Self-pay | Admitting: Oncology

## 2017-12-17 ENCOUNTER — Telehealth: Payer: Self-pay | Admitting: Oncology

## 2017-12-17 NOTE — Telephone Encounter (Signed)
Tried to call regarding change of date. I will mail a letter

## 2018-01-02 ENCOUNTER — Other Ambulatory Visit: Payer: Self-pay | Admitting: Oncology

## 2018-01-02 DIAGNOSIS — C7951 Secondary malignant neoplasm of bone: Secondary | ICD-10-CM

## 2018-01-07 ENCOUNTER — Inpatient Hospital Stay: Payer: Medicare Other

## 2018-01-07 ENCOUNTER — Inpatient Hospital Stay: Payer: Medicare Other | Attending: Oncology

## 2018-01-07 ENCOUNTER — Other Ambulatory Visit: Payer: Medicare Other

## 2018-01-07 VITALS — BP 137/84 | HR 84 | Temp 98.4°F | Resp 16

## 2018-01-07 DIAGNOSIS — C50412 Malignant neoplasm of upper-outer quadrant of left female breast: Secondary | ICD-10-CM

## 2018-01-07 DIAGNOSIS — C7951 Secondary malignant neoplasm of bone: Secondary | ICD-10-CM | POA: Insufficient documentation

## 2018-01-07 DIAGNOSIS — Z853 Personal history of malignant neoplasm of breast: Secondary | ICD-10-CM | POA: Diagnosis not present

## 2018-01-07 DIAGNOSIS — C50912 Malignant neoplasm of unspecified site of left female breast: Secondary | ICD-10-CM

## 2018-01-07 DIAGNOSIS — M810 Age-related osteoporosis without current pathological fracture: Secondary | ICD-10-CM

## 2018-01-07 DIAGNOSIS — Z87891 Personal history of nicotine dependence: Secondary | ICD-10-CM | POA: Diagnosis not present

## 2018-01-07 DIAGNOSIS — C50919 Malignant neoplasm of unspecified site of unspecified female breast: Secondary | ICD-10-CM

## 2018-01-07 DIAGNOSIS — Z5111 Encounter for antineoplastic chemotherapy: Secondary | ICD-10-CM | POA: Diagnosis not present

## 2018-01-07 DIAGNOSIS — D0512 Intraductal carcinoma in situ of left breast: Secondary | ICD-10-CM | POA: Diagnosis not present

## 2018-01-07 LAB — COMPREHENSIVE METABOLIC PANEL
ALK PHOS: 37 U/L — AB (ref 38–126)
ALT: 14 U/L (ref 0–44)
ANION GAP: 9 (ref 5–15)
AST: 19 U/L (ref 15–41)
Albumin: 3.7 g/dL (ref 3.5–5.0)
BILIRUBIN TOTAL: 0.4 mg/dL (ref 0.3–1.2)
BUN: 14 mg/dL (ref 8–23)
CALCIUM: 10.2 mg/dL (ref 8.9–10.3)
CO2: 27 mmol/L (ref 22–32)
Chloride: 105 mmol/L (ref 98–111)
Creatinine, Ser: 0.7 mg/dL (ref 0.44–1.00)
GFR calc non Af Amer: >60 mL/min (ref >60)
Glucose, Bld: 80 mg/dL (ref 70–99)
POTASSIUM: 4.5 mmol/L (ref 3.5–5.1)
Sodium: 141 mmol/L (ref 135–145)
TOTAL PROTEIN: 7.1 g/dL (ref 6.5–8.1)

## 2018-01-07 LAB — CBC WITH DIFFERENTIAL/PLATELET
Basophils Absolute: 0 10*3/uL (ref 0.0–0.1)
Basophils Relative: 1 %
Eosinophils Absolute: 0.1 10*3/uL (ref 0.0–0.5)
Eosinophils Relative: 2 %
HEMATOCRIT: 43.3 % (ref 34.8–46.6)
HEMOGLOBIN: 14.8 g/dL (ref 11.6–15.9)
LYMPHS ABS: 1 10*3/uL (ref 0.9–3.3)
Lymphocytes Relative: 21 %
MCH: 31.1 pg (ref 25.1–34.0)
MCHC: 34.2 g/dL (ref 31.5–36.0)
MCV: 91 fL (ref 79.5–101.0)
MONO ABS: 0.5 10*3/uL (ref 0.1–0.9)
MONOS PCT: 10 %
NEUTROS PCT: 66 %
Neutro Abs: 3.2 10*3/uL (ref 1.5–6.5)
Platelets: 209 10*3/uL (ref 145–400)
RBC: 4.76 MIL/uL (ref 3.70–5.45)
RDW: 13.7 % (ref 11.2–14.5)
WBC: 4.9 10*3/uL (ref 3.9–10.3)

## 2018-01-07 MED ORDER — FULVESTRANT 250 MG/5ML IM SOLN
500.0000 mg | Freq: Once | INTRAMUSCULAR | Status: AC
  Administered 2018-01-07: 500 mg via INTRAMUSCULAR

## 2018-01-07 MED ORDER — FULVESTRANT 250 MG/5ML IM SOLN
INTRAMUSCULAR | Status: AC
  Filled 2018-01-07: qty 5

## 2018-01-07 NOTE — Patient Instructions (Signed)

## 2018-01-08 ENCOUNTER — Other Ambulatory Visit: Payer: Medicare Other

## 2018-01-08 ENCOUNTER — Ambulatory Visit: Payer: Medicare Other

## 2018-01-14 NOTE — Progress Notes (Signed)
[name]  [phone] [phone]   ID: MAKINLEE AWWAD DOB: 78-03-1941  MR#: [id number]  QMG#:[id number]  Patient Care Team: [name], MD as PCP - General (Internal Medicine) Magrinat, [name], MD as Consulting Physician (Oncology) [name], MD as Consulting Physician (Orthopedic Surgery) [name], MD as Consulting Physician (General Surgery) PCP: [name], MD  OTHER: [name], DDS  CHIEF COMPLAINT: Stage IV estrogen receptor positive breast cancer  CURRENT TREATMENT:   Fulvestrant, denosumab/Xgeva; to start abemaciclib  INTERVAL HISTORY: April Rosales returns today for follow-up and treatment of her estrogen receptor positive breast cancer accompanied by her daughter. She receives fulvestrant every 4 weeks with her most recent dose on 01/07/2018. She tolerates this well. She denies any complications from the injections.   She also receives denosumab/Xgeva every 8 weeks. She also tolerates this well.   She underwent biopsy ([id number]) of 2 additional areas of calcifications in the medial and lateral aspect of the left breast on 12/11/2017 with pathology showing: High grade ductal carcinoma in situ with necrosis. Repeat prognotic panel showed: ER 60% positive, but weak staining intensity and PR: negative   REVIEW OF SYSTEMS: Cayleen reports that she did well with the additional biopsies. She had some pain and bruising, but this has resolved. She has pain and soreness in her hips and arms, and this improves with activity. She notices more pain when she is not active. She denies having any falls. She denies unusual headaches, visual changes, nausea, vomiting, or dizziness. There has been no unusual cough, phlegm production, or pleurisy. There has been no change in bowel or bladder habits. She denies unexplained fatigue or unexplained weight loss, bleeding, rash, or fever. A detailed review of systems was otherwise stable.     BREAST CANCER HISTORY: From the original intake note:   April Rosales underwent right lumpectomy in 1985 at [facility] for what sounds like a stage I breast cancer. She tells me she had more than 30 lymph nodes removed from her right axilla and all of them were clear. She received  adjuvant radiation but no systemic treatment.  The patient had recently refused mammography with the last mammogram I can find dating back to August 2010.  More recently the patient presented with left scapular pain radiating down the left arm.  She was evaluated by Dr. [name], who obtained a chest x-ray showing a possible abnormality at T3. He then set up the patient for an MRI of the thoracic spine performed 08/16/2014.  This showed multiple compression fractures  (a similar picture had been noted on lumbar MRI 10/09/2011 ). However at T3 they noted tumor in the vertebral body extending into the left pedicle and into the lateral epidural space, displacing the cord to the right. There was no evidence of cord compression or cord signal abnormality. There were no other areas of tumor identified in the thoracic spine.         The patient was then referred to Dr. Hal Neer who on 08/24/2014 set April Rosales up for CT scans of the chest, abdomen and pelvis. There was a dense mass in the upper inner quadrant of the left breast measuring 1.6 cm. There was a 1.2 cm nodule in the minor fissure of the right lung and some evidence of right lung fibrosis at the site of the prior radiation port.  There was also a thyroid mass measuring 2 cm. However there were no parenchymal lung or liver lesions. Incidental meningoceles were noted  as well as sclerosis of the fifth and sixth ribs which were felt to be likely posttraumatic.   On 08/29/2014 the patient underwent bilateral diagnostic mammography with tomosynthesis and left breast ultrasonography.  There were postsurgical changes in the upper right breast. In the left breast there was an  irregular mass measuring 2.3 cm in the upper inner quadrant. This was palpable. Ultrasound showed this to be hypoechoic and to measure 2.0 cm. There were adjacent areas of nodularity. There was no definite lymphadenopathy in the left axilla.  Biopsy of this breast mass 08/29/2014 showed an invasive adenocarcinoma with both ductal and lobular features (there was strong diffuse E-cadherin expression as well as areas with total absence of E-cadherin expression), with the preliminary prognostic profile showing strong estrogen positivity, very weak to near absent progesterone positivity, an MIB-1 of approximately 40%, and HER-2 equivocal  The patient's subsequent history is as detailed below.   PAST MEDICAL HISTORY: Past Medical History:  Diagnosis Date  . Arthritis   . Breast cancer [facility]) 1985/2016   takes [name] daily  . Chronic back pain    stenosis/listhesis  . Family history of ovarian cancer   . Osteoporosis    takes Vit D  . Radiation 11/23/14-12/11/14   30 Gy T1-T5     PAST SURGICAL HISTORY: Past Surgical History:  Procedure Laterality Date  . ABDOMINAL HYSTERECTOMY     1980  . APPENDECTOMY  1977  . APPLICATION OF INTRAOPERATIVE CT SCAN N/A 10/20/2014   Procedure: APPLICATION OF INTRAOPERATIVE CAT SCAN;  Surgeon: [name], MD;  Location: [place] NEURO ORS;  Service: Neurosurgery;  Laterality: N/A;  . BREAST SURGERY Right 1985  . THYROID CYST EXCISION  1967    FAMILY HISTORY Family History  Problem Relation Age of Onset  . Heart disease Mother   . Hypertension Mother   . Heart disease Father   . Diabetes Father   . Breast cancer Paternal Aunt        4 paternal aunts with breast cancer over 25  . Prostate cancer Paternal Uncle   . Stroke Paternal Grandfather   . Ovarian cancer Paternal Aunt   . Huntington's disease Other        Nephew, inherited from his father   The patient's father died from a heart attack at the age of 11. He had 9 sisters. 3 of those sisters had  breast cancer, all in a menopausal setting. Another sister had ovarian cancer. One of the paternal uncles had cancer of the colon "and back".  The patient's mother died at the age of 24. She was found to have breast cancer shortly before dying , during her final hospitalization.  GYNECOLOGIC HISTORY:  No LMP recorded. Patient has had a hysterectomy.  Menarche age 59, first live birth age 59, the patient is GX P1. She underwent hysterectomy in 1980. She thinks the ovaries were removed, but the CT scan obtained 08/24/2014 showed a definite right ovary. The left ovary may have been removed. She did not take hormone replacement after the hysterectomy.  SOCIAL HISTORY:   Kaelynne worked in Psychiatric nurse. She is divorced, lives alone, with 2 cats. Her son Bethann Berkshire lives in [place] where he works in Engineer, technical sales. He has 4 children of his own. The patient is a Psychologist, forensic.    ADVANCED DIRECTIVES:  In place. The patient has named her sister Pleas Koch [phone]) and her friend Janina Mayo [phone]) as joint healthcare powers of attorney   HEALTH MAINTENANCE: Social History  Tobacco Use  . Smoking status: Former Smoker    Packs/day: 0.50    Years: 10.00    Pack years: 5.00    Types: Cigarettes  . Smokeless tobacco: Former Systems developer  . Tobacco comment: quit smoking 1970's  Substance Use Topics  . Alcohol use: Yes    Alcohol/week: 0.0 standard drinks    Comment: Rare  . Drug use: No     Colonoscopy: never  PAP: status post hysterectomy  Bone density: 09/12/2016 [facility]/ T score of -4.8 osteoporosis  Lipid panel:  Allergies  Allergen Reactions  . Ativan [Lorazepam]     Made her crazy  . Dilaudid [Hydromorphone Hcl]     Doesn't want  . Haldol [Haloperidol Lactate]     Made her crazy    Current Outpatient Medications  Medication Sig Dispense Refill  . Ascorbic Acid (VITAMIN C PO) Take 1 tablet by mouth every other day. Takes qod    . aspirin EC 325 MG tablet Take 1 tablet  (325 mg total) by mouth daily. 30 tablet 0  . Cholecalciferol (VITAMIN D3 ADULT GUMMIES PO) Take 2,000 Units by mouth daily.     . Cyanocobalamin (VITAMIN B-12 PO) Take by mouth daily. Takes every other day    . gabapentin (NEURONTIN) 100 MG capsule Take 1 capsule (100 mg total) by mouth at bedtime. 90 capsule 4  . gabapentin (NEURONTIN) 100 MG capsule TAKE 1-3 CAPSULES BY MOUTH ONCE DURING THE DAYTIME AS NEEDED FOR PAIN(CONTINUE TAKING 300MG SCRIPT AT BEDTIME) 90 capsule 0  . Iron-Vitamins (GERITOL PO) Take by mouth daily.     No current facility-administered medications for this visit.     OBJECTIVE:  Older white woman no acute distress  Vitals:   01/15/18 0932  BP: (!) 141/68  Pulse: 99  Resp: 17  Temp: 98.3 F (36.8 C)  SpO2: 95%     Body mass index is 22.88 kg/m.    ECOG FS:1 - Symptomatic but completely ambulatory  Sclerae unicteric, EOMs intact No cervical or supraclavicular adenopathy Lungs no rales or rhonchi Heart regular rate and rhythm Abd soft, nontender, positive bowel sounds MSK kyphosis and scoliosis but no focal spinal tenderness, no upper extremity lymphedema Neuro: nonfocal, well oriented, appropriate affect Breasts: The right breast has undergone surgery and radiation, with no evidence of local recurrence.  Left breast has recent biopsies.  There are no significant ecchymoses.  Both axillae are benign.  LAB RESULTS:  CMP     Component Value Date/Time   NA 141 01/07/2018 1105   NA 141 05/29/2017 1417   K 4.5 01/07/2018 1105   K 3.6 05/29/2017 1417   CL 105 01/07/2018 1105   CO2 27 01/07/2018 1105   CO2 27 05/29/2017 1417   GLUCOSE 80 01/07/2018 1105   GLUCOSE 111 05/29/2017 1417   BUN 14 01/07/2018 1105   BUN 17.0 05/29/2017 1417   CREATININE 0.70 01/07/2018 1105   CREATININE 0.8 05/29/2017 1417   CALCIUM 10.2 01/07/2018 1105   CALCIUM 10.7 (H) 06/26/2017 1216   CALCIUM 10.4 05/29/2017 1417   PROT 7.1 01/07/2018 1105   PROT 7.3 05/29/2017 1417     ALBUMIN 3.7 01/07/2018 1105   ALBUMIN 3.9 05/29/2017 1417   AST 19 01/07/2018 1105   AST 17 05/29/2017 1417   ALT 14 01/07/2018 1105   ALT 15 05/29/2017 1417   ALKPHOS 37 (L) 01/07/2018 1105   ALKPHOS 42 05/29/2017 1417   BILITOT 0.4 01/07/2018 1105   BILITOT 0.30 05/29/2017 1417  GFRNONAA >60 01/07/2018 1105   GFRNONAA 85 07/07/2016 1125   GFRAA >60 01/07/2018 1105   GFRAA >89 07/07/2016 1125    INo results found for: SPEP, UPEP  Lab Results  Component Value Date   WBC 4.9 01/07/2018   NEUTROABS 3.2 01/07/2018   HGB 14.8 01/07/2018   HCT 43.3 01/07/2018   MCV 91.0 01/07/2018   PLT 209 01/07/2018      Chemistry      Component Value Date/Time   NA 141 01/07/2018 1105   NA 141 05/29/2017 1417   K 4.5 01/07/2018 1105   K 3.6 05/29/2017 1417   CL 105 01/07/2018 1105   CO2 27 01/07/2018 1105   CO2 27 05/29/2017 1417   BUN 14 01/07/2018 1105   BUN 17.0 05/29/2017 1417   CREATININE 0.70 01/07/2018 1105   CREATININE 0.8 05/29/2017 1417      Component Value Date/Time   CALCIUM 10.2 01/07/2018 1105   CALCIUM 10.7 (H) 06/26/2017 1216   CALCIUM 10.4 05/29/2017 1417   ALKPHOS 37 (L) 01/07/2018 1105   ALKPHOS 42 05/29/2017 1417   AST 19 01/07/2018 1105   AST 17 05/29/2017 1417   ALT 14 01/07/2018 1105   ALT 15 05/29/2017 1417   BILITOT 0.4 01/07/2018 1105   BILITOT 0.30 05/29/2017 1417       Lab Results  Component Value Date   LABCA2 24 01/01/2016    No components found for: [id number]  No results for input(s): INR in the last 168 hours.  Urinalysis    Component Value Date/Time   COLORURINE YELLOW 11/25/2017 1143   APPEARANCEUR CLEAR 11/25/2017 1143   LABSPEC 1.016 11/25/2017 1143   PHURINE 7.0 11/25/2017 1143   GLUCOSEU NEGATIVE 11/25/2017 1143   HGBUR NEGATIVE 11/25/2017 1143   BILIRUBINUR NEGATIVE 09/19/2015 1239   KETONESUR NEGATIVE 11/25/2017 1143   PROTEINUR NEGATIVE 11/25/2017 1143   NITRITE NEGATIVE 11/25/2017 1143   LEUKOCYTESUR 1+ (A)  11/25/2017 1143    STUDIES: No results found.  ASSESSMENT: 77 y.o. [place] woman with stage IV left breast cancer involving bone  (1) status post right lumpectomy and axillary node dissection in 1985 followed by radiation at [facility] 08/16/2014: measurable disease in spine, lung and left breast   (2) evaluation for left shoulder pain led to thoracic spine MRI 08/16/2014 showing a pathologic fracture at T3 with epidural tumor displacing the cord to the right, but no cord compression. CT scans of the chest, abdomen and pelvis 08/24/2014 showed in addition a mass in the upper outer quadrant left breast measuring 1.6 cm and a nodule in the minor fissure of the right lung measuring 1.2 cm, but no parenchymal lung or liver lesions.   (a) CA 27-29 was noninformative at 38 (09/20/2014)    (3) mammography and ultrasonography 08/29/2014 show a mass in the upper inner left breast which was palpable,  measuring 2.0 cm by ultrasound. Biopsy of this mass 08/29/2014 showed an invasive breast cancer with both lobular and ductal features, estrogen receptor positive, progesterone receptor weakly positive, with an MIB-1 in the 40% range, HER-2 equivocal (6 else ratio 1.5, but average number her nucleus 5.8)   (4) letrozole started 08/31/2014;   (a) palbociclib added Sept 2016 at 75 mg 21/7, with significant neutropenia; not repeated after first cycle  (b) letrozole discontinued 05/29/2017 with evidence of disease progression in the breast   (5)  zolendronate started 09/20/2014, stopped after initial dose due to poor tolerance  (a) denosumab/ Xgeva started  01/31/2015, repeated every 4 weeks  (b) changed to every 8 weeks as of August 2018.    (6) on 10/20/2014 the patient underwent T2-T3 and T4 decompressive laminectomy with removal of epidural tumor, C7-T4 segmental pedicle screw instrumentation with virage screw system with arrow guidance protocol and C7-T4 posterolateral fusion. The cells  were positive for the estrogen receptor. HER-2/neu testing by [facility] showed again equivocal results,  (a) most recent bone scan 10/01/2016 finds no new lesions only postoperative changes  (7) radiation 11/23/2014-12/11/2014.  (a) T1-T5 was treated to 30 Gy in 12 fractions at 2.5 Gy per fraction   (8)  consider eventual left lumpectomy or mastectomy depending on  longer-term results of systemic therapy  (a) most recent left breast ultrasonography 10/30/2016 found this mass to measure 0.7 cm  (b) repeat left breast ultrasonography 05/13/2017 showed the upper outer quadrant mass to have grown to 1.4 cm  (c) left breast ultrasound 09/08/2017 shows the mass to now measure 0.9 cm  (d) left breast biopsy x2 on 12/11/2017 shows high-grade ductal carcinoma in situ, essentially estrogen receptor negative  (9) genetics testing using the Breast/Ovarian Cancer Panel through [facility] [name], MD) found no deleterious mutations in ATM, BARD1, BRCA1, BRCA2, BRIP1, CDH1, CHEK2, EPCAM, FANCC, MLH1, MSH2, MSH6, NBN, PALB2, PMS2, PTEN, RAD51C, RAD51D, STK11, TP53, or XRCC2    (10) right thyroid nodule is a complex cyst as noted on CT scan of the neck 01/29/2016  (11) osteoporosis: [facility] density at [facility] 09/12/2016 showed a T score of -4.8.  (a) continue denosumab/Xgeva as above  (12) fulvestrant started 05/29/2017  (a) started palbociclib 06/29/2017 at 75 mg every other day for 21 days on, 7 days off  (b) palbociclib discontinued on 3/29 due to progressive fatigue and patient preference  (13) considering left lumpectomy vs mastectomy  (14) to start abemaciclib 02/04/2018   PLAN: I spent approximately 35 minutes face to face with April Rosales and her sister with more than 50% of that time spent in counseling and coordination of care.  We discussed the fact that there has been some growth of the cancer in the left breast.  This is fortunately not invasive which means it cannot spread to other parts of her body.   The bad news is that it is pretty much estrogen receptor negative.  It is technically weakly positive, but progesterone receptor negative.  Clearly it grew through antiestrogens (fulvestrant).  Accordingly I think we do need to consider surgery at this point.  I am setting her up for a breast MRI and referring her to Dr. [name] for consideration of mastectomy versus lumpectomy.  Either way we need to intensify systemic treatment and I am going to start her on abemaciclib on 02/04/2018.  We will review the possible toxicity side effects and complications at that time.  She will return to see [place] again on 03/04/2018, and I will see her with my 1 assistant in the October visit to make sure she is tolerating the abemaciclib well.  The plan will be to completely restage after she has been on abemaciclib 3 months, which means sometime in December.  She had some billing questions which I am referring to our billing department.  She has a fungal rash which I am treating with Nizoral.  She knows to call for any other issues that may develop before her next visit.    Magrinat, [name], MD  01/15/18 9:55 AM Medical Oncology and Hematology Doctor [address]  [place], [place] [address] Tel. [phone]    Fax. [phone]  Alice Rieger, am acting as scribe for Chauncey Cruel MD.  I, Lurline Del MD, have reviewed the above documentation for accuracy and completeness, and I agree with the above.

## 2018-01-15 ENCOUNTER — Inpatient Hospital Stay (HOSPITAL_BASED_OUTPATIENT_CLINIC_OR_DEPARTMENT_OTHER): Payer: Medicare Other | Admitting: Oncology

## 2018-01-15 ENCOUNTER — Telehealth: Payer: Self-pay | Admitting: Pharmacist

## 2018-01-15 ENCOUNTER — Telehealth: Payer: Self-pay | Admitting: Oncology

## 2018-01-15 ENCOUNTER — Telehealth: Payer: Self-pay

## 2018-01-15 VITALS — BP 141/68 | HR 99 | Temp 98.3°F | Resp 17 | Ht 61.5 in | Wt 123.1 lb

## 2018-01-15 DIAGNOSIS — Z87891 Personal history of nicotine dependence: Secondary | ICD-10-CM

## 2018-01-15 DIAGNOSIS — C7951 Secondary malignant neoplasm of bone: Secondary | ICD-10-CM | POA: Diagnosis not present

## 2018-01-15 DIAGNOSIS — C50919 Malignant neoplasm of unspecified site of unspecified female breast: Secondary | ICD-10-CM

## 2018-01-15 DIAGNOSIS — Z853 Personal history of malignant neoplasm of breast: Secondary | ICD-10-CM | POA: Diagnosis not present

## 2018-01-15 DIAGNOSIS — M810 Age-related osteoporosis without current pathological fracture: Secondary | ICD-10-CM

## 2018-01-15 DIAGNOSIS — D0512 Intraductal carcinoma in situ of left breast: Secondary | ICD-10-CM

## 2018-01-15 DIAGNOSIS — R21 Rash and other nonspecific skin eruption: Secondary | ICD-10-CM

## 2018-01-15 DIAGNOSIS — Z5111 Encounter for antineoplastic chemotherapy: Secondary | ICD-10-CM | POA: Diagnosis not present

## 2018-01-15 MED ORDER — ABEMACICLIB 50 MG PO TABS: 50.0000 mg | ORAL_TABLET | Freq: Two times a day (BID) | ORAL | 6 refills | Status: DC

## 2018-01-15 MED ORDER — KETOCONAZOLE 2 % EX CREA: 1.0000 "application " | TOPICAL_CREAM | Freq: Every day | CUTANEOUS | 0 refills | Status: DC

## 2018-01-15 NOTE — Telephone Encounter (Signed)
Gave patient avs and calendars.  Referral to Dr. Dalbert Batman to [facility] via team member, TV.  Patient had question about bill.  Called billing for her so she could speak to them about her questions.

## 2018-01-15 NOTE — Telephone Encounter (Signed)
Oral Oncology Patient Advocate Encounter  Received notification from [facility] that prior authorization for Verzenio is required.  PA submitted on CoverMyMeds Key [id number] Status is pending  Oral Oncology Clinic will continue to follow.  [place] Patient [place] Phone [phone] Fax [phone]

## 2018-01-15 NOTE — Telephone Encounter (Signed)
Oral Oncology Pharmacist Encounter  Received new prescription for Verzenio (abemeciclib) for the treatment of metastatic, hormone receptor positive breast cancer in conjunction with Faslodex, planned duration until disease progression or unacceptable toxicity.  Planned start date after office visit on 02/05/18  Labs from 01/07/18 assessed, OK for treatment. Noted patient initiating Verzenio at reduced dose for increased tolerance due to issues tolerating previous treatment with Ibrance.  Current medication list in Epic reviewed, no DDIs with Verzenio identified.  Prescription has been e-scribed to the [employer] for benefits analysis and approval.  Insurance authorization will be obtained prior to start date.  I will plan to perform initial counseling on Verzenio when patient is in the office on 02/04/2018  Oral Oncology Clinic will continue to follow for insurance authorization, copayment issues, initial counseling and start date.  Johny Drilling, PharmD, BCPS, BCOP  01/15/2018 1:54 PM Oral Oncology Clinic [phone]

## 2018-01-18 ENCOUNTER — Ambulatory Visit: Payer: Medicare Other | Admitting: Oncology

## 2018-01-18 NOTE — Telephone Encounter (Signed)
Oral Oncology Patient Advocate Encounter  Prior Authorization for April Rosales has been approved.    PA# [id number] Effective dates: 01/15/18 through 07/18/18  Oral Oncology Clinic will continue to follow.   [place] Patient [name] Phone [phone] Fax [phone]

## 2018-01-19 ENCOUNTER — Telehealth: Payer: Self-pay | Admitting: Pharmacist

## 2018-01-19 NOTE — Telephone Encounter (Signed)
Oral Oncology Pharmacist Encounter  Spoke with patient yesterday (8/26) as patient had concerns about process for copayment assistance. Patient's copayment for Verzenio is prohibitively expensive. Oral Oncology Patient Advocate is assisting patient in securing copayment great from the Patient [place] (PAF). During process, income verification step was not able to be completed by PAF and they have requested patient provide proof of income for income verification prior to being able to award her a copayment grant. Patient displeased with needing to provide income verification and with multiple questions about income verification process. She also would like to have a conversation with Dr. Jana Hakim about whether she would still need treatment if surgery is indicated. We spoke about need for treatment change and plan to provide initial counseling on Verzenio face-to-face during office visit on 02/04/18. I also provided phone number to PAF [phone]) if patient would like further clarification about the foundation's income varication process. Patient reminded that grant foundation funds open and close on a moment's notice and that this issue may be time-sensitive. Patient states she will call {AF and keep [place] posted.  Johny Drilling, PharmD, BCPS, BCOP  01/19/2018 7:53 AM Oral Oncology Clinic [phone]

## 2018-01-26 ENCOUNTER — Telehealth: Payer: Self-pay

## 2018-01-26 NOTE — Telephone Encounter (Signed)
Oral Oncology Patient Advocate Encounter  I called the patient to follow up with her on the (PAF) grant application that she is unsure of because she does not want them to have her tax information. She states that she read about the side effects of Verzenio and shes not sure she wants to take it. She would like to speak to Dr. Jana Hakim about this. She also wants to know why she is taking a pill when she is going to have surgery. I assured her I would relay this message to the nurse and Dr. Jana Hakim.   She said she will send the information they are requesting to [place] (PAF) this week.  [place] Patient [place] Phone [phone] Fax [phone]

## 2018-02-03 ENCOUNTER — Other Ambulatory Visit: Payer: Self-pay | Admitting: Oncology

## 2018-02-03 NOTE — Progress Notes (Unsigned)
Zian was going to come tomorrow to see my 39 assistant but I do not think she is going to be able to make the decisions that [name] needs to make regarding Verzenio and so on so I have move that appointment to 03/04/2018.  Adi will still come in for her shot as before

## 2018-02-04 ENCOUNTER — Inpatient Hospital Stay: Payer: Medicare Other | Attending: Oncology

## 2018-02-04 ENCOUNTER — Inpatient Hospital Stay: Payer: Medicare Other | Admitting: Adult Health

## 2018-02-04 ENCOUNTER — Inpatient Hospital Stay: Payer: Medicare Other

## 2018-02-04 DIAGNOSIS — C50919 Malignant neoplasm of unspecified site of unspecified female breast: Secondary | ICD-10-CM

## 2018-02-04 DIAGNOSIS — M810 Age-related osteoporosis without current pathological fracture: Secondary | ICD-10-CM

## 2018-02-04 DIAGNOSIS — D0512 Intraductal carcinoma in situ of left breast: Secondary | ICD-10-CM | POA: Insufficient documentation

## 2018-02-04 DIAGNOSIS — Z5111 Encounter for antineoplastic chemotherapy: Secondary | ICD-10-CM | POA: Diagnosis not present

## 2018-02-04 DIAGNOSIS — C7951 Secondary malignant neoplasm of bone: Secondary | ICD-10-CM | POA: Diagnosis not present

## 2018-02-04 DIAGNOSIS — C50912 Malignant neoplasm of unspecified site of left female breast: Secondary | ICD-10-CM

## 2018-02-04 DIAGNOSIS — C50412 Malignant neoplasm of upper-outer quadrant of left female breast: Secondary | ICD-10-CM

## 2018-02-04 LAB — COMPREHENSIVE METABOLIC PANEL
ALT: 13 U/L (ref 0–44)
ANION GAP: 8 (ref 5–15)
AST: 19 U/L (ref 15–41)
Albumin: 3.6 g/dL (ref 3.5–5.0)
Alkaline Phosphatase: 39 U/L (ref 38–126)
BILIRUBIN TOTAL: 0.4 mg/dL (ref 0.3–1.2)
BUN: 17 mg/dL (ref 8–23)
CO2: 28 mmol/L (ref 22–32)
Calcium: 10.3 mg/dL (ref 8.9–10.3)
Chloride: 104 mmol/L (ref 98–111)
Creatinine, Ser: 0.71 mg/dL (ref 0.44–1.00)
GFR calc Af Amer: >60 mL/min (ref >60)
Glucose, Bld: 79 mg/dL (ref 70–99)
POTASSIUM: 4.7 mmol/L (ref 3.5–5.1)
Sodium: 140 mmol/L (ref 135–145)
TOTAL PROTEIN: 6.8 g/dL (ref 6.5–8.1)

## 2018-02-04 LAB — CBC WITH DIFFERENTIAL/PLATELET
Basophils Absolute: 0 10*3/uL (ref 0.0–0.1)
Basophils Relative: 1 %
EOS PCT: 3 %
Eosinophils Absolute: 0.2 10*3/uL (ref 0.0–0.5)
HEMATOCRIT: 42 % (ref 34.8–46.6)
Hemoglobin: 14.2 g/dL (ref 11.6–15.9)
LYMPHS PCT: 24 %
Lymphs Abs: 1.2 10*3/uL (ref 0.9–3.3)
MCH: 31.4 pg (ref 25.1–34.0)
MCHC: 33.8 g/dL (ref 31.5–36.0)
MCV: 92.9 fL (ref 79.5–101.0)
Monocytes Absolute: 0.5 10*3/uL (ref 0.1–0.9)
Monocytes Relative: 9 %
Neutro Abs: 3.2 10*3/uL (ref 1.5–6.5)
Neutrophils Relative %: 63 %
Platelets: 212 10*3/uL (ref 145–400)
RBC: 4.52 MIL/uL (ref 3.70–5.45)
RDW: 14.2 % (ref 11.2–14.5)
WBC: 5.1 10*3/uL (ref 3.9–10.3)

## 2018-02-04 MED ORDER — FULVESTRANT 250 MG/5ML IM SOLN
INTRAMUSCULAR | Status: AC
  Filled 2018-02-04: qty 10

## 2018-02-04 MED ORDER — FULVESTRANT 250 MG/5ML IM SOLN
500.0000 mg | Freq: Once | INTRAMUSCULAR | Status: AC
  Administered 2018-02-04: 500 mg via INTRAMUSCULAR

## 2018-02-04 MED ORDER — DENOSUMAB 120 MG/1.7ML ~~LOC~~ SOLN
SUBCUTANEOUS | Status: AC
  Filled 2018-02-04: qty 1.7

## 2018-02-04 MED ORDER — DENOSUMAB 120 MG/1.7ML ~~LOC~~ SOLN
120.0000 mg | Freq: Once | SUBCUTANEOUS | Status: AC
  Administered 2018-02-04: 120 mg via SUBCUTANEOUS

## 2018-02-04 NOTE — Patient Instructions (Signed)
Denosumab injection What is this medicine? DENOSUMAB (den oh sue mab) slows bone breakdown. Prolia is used to treat osteoporosis in women after menopause and in men. Delton See is used to treat a high calcium level due to cancer and to prevent bone fractures and other bone problems caused by multiple myeloma or cancer bone metastases. Delton See is also used to treat giant cell tumor of the bone. This medicine may be used for other purposes; ask your health care provider or pharmacist if you have questions. COMMON BRAND NAME(S): Prolia, XGEVA What should I tell my health care provider before I take this medicine? They need to know if you have any of these conditions: -dental disease -having surgery or tooth extraction -infection -kidney disease -low levels of calcium or Vitamin D in the blood -malnutrition -on hemodialysis -skin conditions or sensitivity -thyroid or parathyroid disease -an unusual reaction to denosumab, other medicines, foods, dyes, or preservatives -pregnant or trying to get pregnant -breast-feeding How should I use this medicine? This medicine is for injection under the skin. It is given by a health care professional in a hospital or clinic setting. If you are getting Prolia, a special MedGuide will be given to you by the pharmacist with each prescription and refill. Be sure to read this information carefully each time. For Prolia, talk to your pediatrician regarding the use of this medicine in children. Special care may be needed. For Delton See, talk to your pediatrician regarding the use of this medicine in children. While this drug may be prescribed for children as young as 13 years for selected conditions, precautions do apply. Overdosage: If you think you have taken too much of this medicine contact a poison control center or emergency room at once. NOTE: This medicine is only for you. Do not share this medicine with others. What if I miss a dose? It is important not to miss your  dose. Call your doctor or health care professional if you are unable to keep an appointment. What may interact with this medicine? Do not take this medicine with any of the following medications: -other medicines containing denosumab This medicine may also interact with the following medications: -medicines that lower your chance of fighting infection -steroid medicines like prednisone or cortisone This list may not describe all possible interactions. Give your health care provider a list of all the medicines, herbs, non-prescription drugs, or dietary supplements you use. Also tell them if you smoke, drink alcohol, or use illegal drugs. Some items may interact with your medicine. What should I watch for while using this medicine? Visit your doctor or health care professional for regular checks on your progress. Your doctor or health care professional may order blood tests and other tests to see how you are doing. Call your doctor or health care professional for advice if you get a fever, chills or sore throat, or other symptoms of a cold or flu. Do not treat yourself. This drug may decrease your body's ability to fight infection. Try to avoid being around people who are sick. You should make sure you get enough calcium and vitamin D while you are taking this medicine, unless your doctor tells you not to. Discuss the foods you eat and the vitamins you take with your health care professional. See your dentist regularly. Brush and floss your teeth as directed. Before you have any dental work done, tell your dentist you are receiving this medicine. Do not become pregnant while taking this medicine or for 5 months after stopping  it. Talk with your doctor or health care professional about your birth control options while taking this medicine. Women should inform their doctor if they wish to become pregnant or think they might be pregnant. There is a potential for serious side effects to an unborn child. Talk  to your health care professional or pharmacist for more information. What side effects may I notice from receiving this medicine? Side effects that you should report to your doctor or health care professional as soon as possible: -allergic reactions like skin rash, itching or hives, swelling of the face, lips, or tongue -bone pain -breathing problems -dizziness -jaw pain, especially after dental work -redness, blistering, peeling of the skin -signs and symptoms of infection like fever or chills; cough; sore throat; pain or trouble passing urine -signs of low calcium like fast heartbeat, muscle cramps or muscle pain; pain, tingling, numbness in the hands or feet; seizures -unusual bleeding or bruising -unusually weak or tired Side effects that usually do not require medical attention (report to your doctor or health care professional if they continue or are bothersome): -constipation -diarrhea -headache -joint pain -loss of appetite -muscle pain -runny nose -tiredness -upset stomach This list may not describe all possible side effects. Call your doctor for medical advice about side effects. You may report side effects to FDA at 1-800-FDA-1088. Where should I keep my medicine? This medicine is only given in a clinic, doctor's office, or other health care setting and will not be stored at home. NOTE: This sheet is a summary. It may not cover all possible information. If you have questions about this medicine, talk to your doctor, pharmacist, or health care provider.  2018 [employer] (2016-06-03 19:17:21)   Fulvestrant injection What is this medicine? FULVESTRANT (ful VES trant) blocks the effects of estrogen. It is used to treat breast cancer. This medicine may be used for other purposes; ask your health care provider or pharmacist if you have questions. COMMON BRAND NAME(S): FASLODEX What should I tell my health care provider before I take this medicine? They need to  know if you have any of these conditions: -bleeding problems -liver disease -low levels of platelets in the blood -an unusual or allergic reaction to fulvestrant, other medicines, foods, dyes, or preservatives -pregnant or trying to get pregnant -breast-feeding How should I use this medicine? This medicine is for injection into a muscle. It is usually given by a health care professional in a hospital or clinic setting. Talk to your pediatrician regarding the use of this medicine in children. Special care may be needed. Overdosage: If you think you have taken too much of this medicine contact a poison control center or emergency room at once. NOTE: This medicine is only for you. Do not share this medicine with others. What if I miss a dose? It is important not to miss your dose. Call your doctor or health care professional if you are unable to keep an appointment. What may interact with this medicine? -medicines that treat or prevent blood clots like warfarin, enoxaparin, and dalteparin This list may not describe all possible interactions. Give your health care provider a list of all the medicines, herbs, non-prescription drugs, or dietary supplements you use. Also tell them if you smoke, drink alcohol, or use illegal drugs. Some items may interact with your medicine. What should I watch for while using this medicine? Your condition will be monitored carefully while you are receiving this medicine. You will need important blood work done while you   this medicine. Do not become pregnant while taking this medicine or for at least 1 year after stopping it. Women of child-bearing potential will need to have a negative pregnancy test before starting this medicine. Women should inform their doctor if they wish to become pregnant or think they might be pregnant. There is a potential for serious side effects to an unborn child. Men should inform their doctors if they wish to father a child. This  medicine may lower sperm counts. Talk to your health care professional or pharmacist for more information. Do not breast-feed an infant while taking this medicine or for 1 year after the last dose. What side effects may I notice from receiving this medicine? Side effects that you should report to your doctor or health care professional as soon as possible: -allergic reactions like skin rash, itching or hives, swelling of the face, lips, or tongue -feeling faint or lightheaded, falls -pain, tingling, numbness, or weakness in the legs -signs and symptoms of infection like fever or chills; cough; flu-like symptoms; sore throat -vaginal bleeding Side effects that usually do not require medical attention (report to your doctor or health care professional if they continue or are bothersome): -aches, pains -constipation -diarrhea -headache -hot flashes -nausea, vomiting -pain at site where injected -stomach pain This list may not describe all possible side effects. Call your doctor for medical advice about side effects. You may report side effects to FDA at 1-800-FDA-1088. Where should I keep my medicine? This drug is given in a hospital or clinic and will not be stored at home. NOTE: This sheet is a summary. It may not cover all possible information. If you have questions about this medicine, talk to your doctor, pharmacist, or health care provider.  2018 [employer] Standard (2014-12-08 11:03:55)

## 2018-02-25 ENCOUNTER — Ambulatory Visit
Admission: RE | Admit: 2018-02-25 | Discharge: 2018-02-25 | Disposition: A | Discharge status: Home / Self Care | Payer: Medicare Other | Source: Ambulatory Visit | Attending: Oncology | Admitting: Oncology

## 2018-02-25 DIAGNOSIS — C50919 Malignant neoplasm of unspecified site of unspecified female breast: Secondary | ICD-10-CM

## 2018-02-25 DIAGNOSIS — C7951 Secondary malignant neoplasm of bone: Secondary | ICD-10-CM

## 2018-02-25 DIAGNOSIS — Z853 Personal history of malignant neoplasm of breast: Secondary | ICD-10-CM | POA: Diagnosis not present

## 2018-02-25 MED ORDER — GADOBENATE DIMEGLUMINE 529 MG/ML IV SOLN
10.0000 mL | Freq: Once | INTRAVENOUS | Status: AC | PRN
  Administered 2018-02-25: 10 mL via INTRAVENOUS

## 2018-03-01 MED ORDER — INFLUENZA VAC SPLIT QUAD 0.5 ML IM SUSY
PREFILLED_SYRINGE | INTRAMUSCULAR | Status: AC
  Filled 2018-03-01: qty 0.5

## 2018-03-03 NOTE — Progress Notes (Signed)
[place]  [phone] [phone]   ID: April Rosales DOB: 12-11-1940  MR#: [id number]  OQH#:[id number]  Patient Care Team: [name], MD as PCP - General (Internal Medicine) Magrinat, [name], MD as Consulting Physician (Oncology) [name], MD as Consulting Physician (Orthopedic Surgery) [name], MD as Consulting Physician (General Surgery) PCP: [name], MD  OTHER: [name], DDS  CHIEF COMPLAINT: Stage IV estrogen receptor positive breast cancer  CURRENT TREATMENT:   Fulvestrant, denosumab/Xgeva;   INTERVAL HISTORY: April Rosales returns today for follow-up and treatment of her estrogen receptor positive breast cancer accompanied by her family member. She receives fulvestrant every 4 weeks with her most recent dose on 02/04/2018. She tolerates this well.    She also receives denosumab/Xgeva every 8 weeks with her most recent dose on 02/04/2018. She tolerates this well.   She hasn't started Verzenio as of yet, and she states that she hasn't obtained the prescription as of yet.  She underwent a MR bilateral breast on 02/25/2018 that showed: Breast Density category C.  There is an error in the dictation which states "known biopsy-proven malignancy over the inner slightly upper right breast measuring 4 x 8 mm with associated clip artifact. Significantly smaller compared to previous MRI from 2016 at which time it measured 2.7 cm in greatest diameter".  Actually that was all in the left breast.  We will make sure that radiology corrects that.  Clip artifact with subtle associated enhancement over the upper inner quadrant of the left breast compatible with recent biopsy proven high grade DCIS. Post lumpectomy changes of the right breast. No suspicious right breast findings. No axillary adenopathy.   REVIEW OF SYSTEMS: April Rosales reports that she is not currently exercising and that she is watching TV mainly throughout the day. She  denies unusual headaches, visual changes, nausea, vomiting, or dizziness. There has been no unusual cough, phlegm production, or pleurisy. This been no change in bowel or bladder habits. She denies unexplained fatigue or unexplained weight loss, bleeding, rash, or fever. A detailed review of systems was otherwise stable.    BREAST CANCER HISTORY: From the original intake note:   April Rosales underwent right lumpectomy in 1985 at [facility] for what sounds like a stage I breast cancer. She tells me she had more than 30 lymph nodes removed from her right axilla and all of them were clear. She received  adjuvant radiation but no systemic treatment.  The patient had recently refused mammography with the last mammogram I can find dating back to August 2010.  More recently the patient presented with left scapular pain radiating down the left arm.  She was evaluated by April Rosales, who obtained a chest x-ray showing a possible abnormality at T3. He then set up the patient for an MRI of the thoracic spine performed 08/16/2014.  This showed multiple compression fractures  (a similar picture had been noted on lumbar MRI 10/09/2011 ). However at T3 they noted tumor in the vertebral body extending into the left pedicle and into the lateral epidural space, displacing the cord to the right. There was no evidence of cord compression or cord signal abnormality. There were no other areas of tumor identified in the thoracic spine.         The patient was then referred to Dr. Hal Neer who on 08/24/2014 set April Rosales up for CT scans of the chest, abdomen and pelvis. There was a dense mass in the upper inner quadrant of  the left breast measuring 1.6 cm. There was a 1.2 cm nodule in the minor fissure of the right lung and some evidence of right lung fibrosis at the site of the prior radiation port.  There was also a thyroid mass measuring 2 cm. However there were no parenchymal lung or liver lesions. Incidental meningoceles  were noted as well as sclerosis of the fifth and sixth ribs which were felt to be likely posttraumatic.   On 08/29/2014 the patient underwent bilateral diagnostic mammography with tomosynthesis and left breast ultrasonography.  There were postsurgical changes in the upper right breast. In the left breast there was an irregular mass measuring 2.3 cm in the upper inner quadrant. This was palpable. Ultrasound showed this to be hypoechoic and to measure 2.0 cm. There were adjacent areas of nodularity. There was no definite lymphadenopathy in the left axilla.  Biopsy of this breast mass 08/29/2014 showed an invasive adenocarcinoma with both ductal and lobular features (there was strong diffuse E-cadherin expression as well as areas with total absence of E-cadherin expression), with the preliminary prognostic profile showing strong estrogen positivity, very weak to near absent progesterone positivity, an MIB-1 of approximately 40%, and HER-2 equivocal  The patient's subsequent history is as detailed below.   PAST MEDICAL HISTORY: Past Medical History:  Diagnosis Date  . Arthritis   . Breast cancer [facility]) 1985/2016   takes [name] daily  . Chronic back pain    stenosis/listhesis  . Family history of ovarian cancer   . Osteoporosis    takes Vit D  . Radiation 11/23/14-12/11/14   30 Gy T1-T5     PAST SURGICAL HISTORY: Past Surgical History:  Procedure Laterality Date  . ABDOMINAL HYSTERECTOMY     1980  . APPENDECTOMY  1977  . APPLICATION OF INTRAOPERATIVE CT SCAN N/A 10/20/2014   Procedure: APPLICATION OF INTRAOPERATIVE CAT SCAN;  Surgeon: [name], MD;  Location: [place] [facility];  Service: Neurosurgery;  Laterality: N/A;  . BREAST SURGERY Right 1985  . THYROID CYST EXCISION  1967    FAMILY HISTORY Family History  Problem Relation Age of Onset  . Heart disease Mother   . Hypertension Mother   . Heart disease Father   . Diabetes Father   . Breast cancer Paternal Aunt        4 paternal  aunts with breast cancer over 78  . Prostate cancer Paternal Uncle   . Stroke Paternal Grandfather   . Ovarian cancer Paternal Aunt   . Huntington's disease Other        Nephew, inherited from his father   The patient's father died from a heart attack at the age of 80. He had 9 sisters. 3 of those sisters had breast cancer, all in a menopausal setting. Another sister had ovarian cancer. One of the paternal uncles had cancer of the colon "and back".  The patient's mother died at the age of 68. She was found to have breast cancer shortly before dying , during her final hospitalization.  GYNECOLOGIC HISTORY:  No LMP recorded. Patient has had a hysterectomy.  Menarche age 26, first live birth age 54, the patient is GX P1. She underwent hysterectomy in 1980. She thinks the ovaries were removed, but the CT scan obtained 08/24/2014 showed a definite right ovary. The left ovary may have been removed. She did not take hormone replacement after the hysterectomy.  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 She is divorced, lives alone, with 2 cats. Her son  April Rosales lives in [place] where he works in Engineer, technical sales. He has 4 children of his own. The patient is a Psychologist, forensic.    ADVANCED DIRECTIVES:  In place. The patient has named her sister Pleas Koch [phone]) and her friend Janina Mayo [phone]) as joint healthcare powers of attorney   HEALTH MAINTENANCE: Social History   Tobacco Use  . Smoking status: Former Smoker    Packs/day: 0.50    Years: 10.00    Pack years: 5.00    Types: Cigarettes  . Smokeless tobacco: Former Systems developer  . Tobacco comment: quit smoking 1970's  Substance Use Topics  . Alcohol use: Yes    Alcohol/week: 0.0 standard drinks    Comment: Rare  . Drug use: No     Colonoscopy: never  PAP: status post hysterectomy  Bone density: 09/12/2016 April Rosales/ T score of -4.8 osteoporosis  Lipid panel:  Allergies  Allergen Reactions  . Ativan [Lorazepam]      Made her crazy  . Dilaudid [Hydromorphone Hcl]     Doesn't want  . Haldol [Haloperidol Lactate]     Made her crazy    Current Outpatient Medications  Medication Sig Dispense Refill  . abemaciclib (VERZENIO) 50 MG tablet Take 1 tablet (50 mg total) by mouth 2 (two) times daily. Swallow tablets whole. Do not chew, crush, or split tablets before swallowing. 56 tablet 6  . Ascorbic Acid (VITAMIN C PO) Take 1 tablet by mouth every other day. Takes qod    . aspirin EC 325 MG tablet Take 1 tablet (325 mg total) by mouth daily. 30 tablet 0  . Cholecalciferol (VITAMIN D3 ADULT GUMMIES PO) Take 2,000 Units by mouth daily.     . Cyanocobalamin (VITAMIN B-12 PO) Take by mouth daily. Takes every other day    . gabapentin (NEURONTIN) 100 MG capsule Take 1 capsule (100 mg total) by mouth at bedtime. 90 capsule 4  . gabapentin (NEURONTIN) 100 MG capsule TAKE 1-3 CAPSULES BY MOUTH ONCE DURING THE DAYTIME AS NEEDED FOR PAIN(CONTINUE TAKING 300MG SCRIPT AT BEDTIME) 90 capsule 0  . Iron-Vitamins (GERITOL PO) Take by mouth daily.    Marland Kitchen ketoconazole (NIZORAL) 2 % cream Apply 1 application topically daily. 15 g 0   No current facility-administered medications for this visit.     OBJECTIVE:  Older white woman who appears stated age  64:   03/04/18 1124  BP: 140/74  Pulse: 90  Resp: 18  Temp: 98.2 F (36.8 C)  SpO2: 97%     Body mass index is 22.73 kg/m.    ECOG FS:1 - Symptomatic but completely ambulatory  Sclerae unicteric, pupils round and equal Oropharynx clear and moist No cervical or supraclavicular adenopathy Lungs no rales or rhonchi Heart regular rate and rhythm Abd soft, nontender, positive bowel sounds MSK no focal spinal tenderness, no upper extremity lymphedema Neuro: nonfocal, well oriented, appropriate affect Breasts: The right breast is status post prior surgery and radiation.  There is no evidence of local recurrence.  The left breast is unremarkable, with no palpable masses and  no skin or nipple changes of concern..  Both axillae are benign.  LAB RESULTS:  CMP     Component Value Date/Time   NA 141 03/04/2018 1058   NA 141 05/29/2017 1417   K 4.4 03/04/2018 1058   K 3.6 05/29/2017 1417   CL 105 03/04/2018 1058   CO2 27 03/04/2018 1058   CO2 27 05/29/2017 1417   GLUCOSE 81 03/04/2018 1058   GLUCOSE 111  05/29/2017 1417   BUN 14 03/04/2018 1058   BUN 17.0 05/29/2017 1417   CREATININE 0.69 03/04/2018 1058   CREATININE 0.8 05/29/2017 1417   CALCIUM 10.3 03/04/2018 1058   CALCIUM 10.7 (H) 06/26/2017 1216   CALCIUM 10.4 05/29/2017 1417   PROT 7.1 03/04/2018 1058   PROT 7.3 05/29/2017 1417   ALBUMIN 3.8 03/04/2018 1058   ALBUMIN 3.9 05/29/2017 1417   AST 20 03/04/2018 1058   AST 17 05/29/2017 1417   ALT 15 03/04/2018 1058   ALT 15 05/29/2017 1417   ALKPHOS 37 (L) 03/04/2018 1058   ALKPHOS 42 05/29/2017 1417   BILITOT 0.4 03/04/2018 1058   BILITOT 0.30 05/29/2017 1417   GFRNONAA >60 03/04/2018 1058   GFRNONAA 85 07/07/2016 1125   GFRAA >60 03/04/2018 1058   GFRAA >89 07/07/2016 1125    INo results found for: SPEP, UPEP  Lab Results  Component Value Date   WBC 4.3 03/04/2018   NEUTROABS 2.7 03/04/2018   HGB 14.6 03/04/2018   HCT 43.3 03/04/2018   MCV 92.1 03/04/2018   PLT 219 03/04/2018      Chemistry      Component Value Date/Time   NA 141 03/04/2018 1058   NA 141 05/29/2017 1417   K 4.4 03/04/2018 1058   K 3.6 05/29/2017 1417   CL 105 03/04/2018 1058   CO2 27 03/04/2018 1058   CO2 27 05/29/2017 1417   BUN 14 03/04/2018 1058   BUN 17.0 05/29/2017 1417   CREATININE 0.69 03/04/2018 1058   CREATININE 0.8 05/29/2017 1417      Component Value Date/Time   CALCIUM 10.3 03/04/2018 1058   CALCIUM 10.7 (H) 06/26/2017 1216   CALCIUM 10.4 05/29/2017 1417   ALKPHOS 37 (L) 03/04/2018 1058   ALKPHOS 42 05/29/2017 1417   AST 20 03/04/2018 1058   AST 17 05/29/2017 1417   ALT 15 03/04/2018 1058   ALT 15 05/29/2017 1417   BILITOT 0.4  03/04/2018 1058   BILITOT 0.30 05/29/2017 1417       Lab Results  Component Value Date   LABCA2 24 01/01/2016    No components found for: AQTMA263  No results for input(s): INR in the last 168 hours.  Urinalysis    Component Value Date/Time   COLORURINE YELLOW 11/25/2017 1143   APPEARANCEUR CLEAR 11/25/2017 1143   LABSPEC 1.016 11/25/2017 1143   PHURINE 7.0 11/25/2017 1143   GLUCOSEU NEGATIVE 11/25/2017 1143   HGBUR NEGATIVE 11/25/2017 1143   BILIRUBINUR NEGATIVE 09/19/2015 [place] 11/25/2017 1143   PROTEINUR NEGATIVE 11/25/2017 1143   NITRITE NEGATIVE 11/25/2017 1143   LEUKOCYTESUR 1+ (A) 11/25/2017 1143    STUDIES: Mr Breast Bilateral W Wo Contrast Inc Cad  Result Date: 02/25/2018 CLINICAL DATA:  History of malignant right lumpectomy 1985. Biopsy-proven left breast cancer in her mid to upper left breast diagnosed 2016 without surgery being managed medically. Recent stereotactic biopsy 2 adjacent groups of microcalcifications upper inner left breast demonstrating high grade DCIS. LABS:  None. EXAM: BILATERAL BREAST MRI WITH AND WITHOUT CONTRAST TECHNIQUE: Multiplanar, multisequence MR images of both breasts were obtained prior to and following the intravenous administration of 10 ml of MultiHance. Three-dimensional MR images were rendered by post-processing the original MR data using the DynaCAD thin client. The 3D MR images are interpreted and the findings are included in the complete MRI report below. COMPARISON:  Previous exam(s). FINDINGS: Breast composition: c. Heterogeneous fibroglandular tissue. Background parenchymal enhancement: Moderate. Right breast: No mass  or abnormal enhancement. Changes compatible previous right lumpectomy with decreased breast size compared to the left. Left breast: Clip artifact over biopsy-proven malignancy in the middle third of the inner slightly upper left breast with focal enhancement over this region measuring 4 x 8 mm. This  is significantly smaller compared to the previous MRI from 2016 at which time it measured 2.7 cm in greatest diameter. There is biopsy clip artifact over the more superior upper inner quadrant of the left breast compatible with recent biopsy proven high grade DCIS. Only very subtle enhancement adjacent the clip artifact. Lymph nodes: There are a few small lymph nodes over the axillary tail of the left breast and left axilla unchanged from previous exam. No suspicious lymph nodes within either axilla. Ancillary findings:  None. IMPRESSION: 1. Known biopsy-proven malignancy over the inner slightly upper right breast measuring 4 x 8 mm with associated clip artifact. Significantly smaller compared to previous MRI from 2016 at which time it measured 2.7 cm in greatest diameter. 2. Clip artifact with subtle associated enhancement over the upper inner quadrant of the left breast compatible with recent biopsy proven high grade DCIS. 3. Post lumpectomy changes of the right breast. No suspicious right breast findings. 4.  No axillary adenopathy. RECOMMENDATION: Recommend follow-up as per clinical treatment plan. BI-RADS CATEGORY  6: Known biopsy-proven malignancy. Electronically Signed   By: Marin Olp M.D.   On: 02/25/2018 16:40    ASSESSMENT: 77 y.o. [place] woman with stage IV left breast cancer involving bone  (1) status post right lumpectomy and axillary node dissection in 1985 followed by radiation at [facility] 08/16/2014: measurable disease in spine, lung and left breast   (2) evaluation for left shoulder pain led to thoracic spine MRI 08/16/2014 showing a pathologic fracture at T3 with epidural tumor displacing the cord to the right, but no cord compression. CT scans of the chest, abdomen and pelvis 08/24/2014 showed in addition a mass in the upper outer quadrant left breast measuring 1.6 cm and a nodule in the minor fissure of the right lung measuring 1.2 cm, but no parenchymal lung or  liver lesions.   (a) CA 27-29 was noninformative at 38 (09/20/2014)    (3) mammography and ultrasonography 08/29/2014 show a mass in the upper inner left breast which was palpable,  measuring 2.0 cm by ultrasound. Biopsy of this mass 08/29/2014 showed an invasive breast cancer with both lobular and ductal features, estrogen receptor positive, progesterone receptor weakly positive, with an MIB-1 in the 40% range, HER-2 equivocal (6 else ratio 1.5, but average number her nucleus 5.8)   (4) letrozole started 08/31/2014;   (a) palbociclib added Sept 2016 at 75 mg 21/7, with significant neutropenia; not repeated after first cycle  (b) letrozole discontinued 05/29/2017 with evidence of disease progression in the breast   (5)  zolendronate started 09/20/2014, stopped after initial dose due to poor tolerance  (a) denosumab/ Xgeva started 01/31/2015, repeated every 4 weeks  (b) changed to every 8 weeks as of August 2018.    (6) on 10/20/2014 the patient underwent T2-T3 and T4 decompressive laminectomy with removal of epidural tumor, C7-T4 segmental pedicle screw instrumentation with virage screw system with arrow guidance protocol and C7-T4 posterolateral fusion. The cells were positive for the estrogen receptor. HER-2/neu testing by [facility] showed again equivocal results,  (a) most recent bone scan 10/01/2016 finds no new lesions only postoperative changes  (7) radiation 11/23/2014-12/11/2014.  (a) T1-T5 was treated to 30 Gy in 12  fractions at 2.5 Gy per fraction   (8)  consider eventual left lumpectomy or mastectomy depending on  longer-term results of systemic therapy  (a) most recent left breast ultrasonography 10/30/2016 found this mass to measure 0.7 cm  (b) repeat left breast ultrasonography 05/13/2017 showed the upper outer quadrant mass to have grown to 1.4 cm  (c) left breast ultrasound 09/08/2017 shows the mass to now measure 0.9 cm  (d) left breast biopsy x2 on 12/11/2017 shows high-grade ductal  carcinoma in situ, essentially estrogen receptor negative  (9) genetics testing using the Breast/Ovarian Cancer Panel through [facility] [name], MD) found no deleterious mutations in ATM, BARD1, BRCA1, BRCA2, BRIP1, CDH1, CHEK2, EPCAM, FANCC, MLH1, MSH2, MSH6, NBN, PALB2, PMS2, PTEN, RAD51C, RAD51D, STK11, TP53, or XRCC2    (10) right thyroid nodule is a complex cyst as noted on CT scan of the neck 01/29/2016  (11) osteoporosis: Bone density at [facility] 09/12/2016 showed a T score of -4.8.  (a) continue denosumab/Xgeva as above  (12) fulvestrant started 05/29/2017  (a) started palbociclib 06/29/2017 at 75 mg every other day for 21 days on, 7 days off  (b) palbociclib discontinued on 3/29 due to progressive fatigue and patient preference  (13) considering left lumpectomy vs mastectomy  (14) was 2 to start abemaciclib 02/04/2018, but patient opted for going back to palbociclib at a lower dose beginning in November 19   PLAN: It took [place] a while to sort things out today with April Rosales but I think we finally did get everything straight.  She had an ultrasound that suggested growth in 1 of her left breast lesions.  We referred her for consideration of surgery especially as she was not tolerating the palbociclib, which had to be stopped.  We then obtained the breast MRI which correctly states in the body of the report that the left breast mass is now considerably smaller than it was in 2016 and in fact really unchanged from what it had been previously (8 mm).  We will correct the error which states in the impression that this was on the right side.  Given all this I am comfortable continuing the current treatment although I would be more comfortable if the patient went back on palbociclib.  She is willing to go back on palbociclib at 75 mg every other day beginning 28 days from now.  I will make sure that she has that prescription in place.  She will see our 94 assistant that day just to  make sure everything goes on as scheduled.  I will then see her on the day of her December treatment just to make sure she is tolerating the palbociclib well.  We will repeat a breast MRI in April of next year.  She knows to call for any other issues that may develop before the next visit.    Magrinat, [name], MD  03/04/18 11:50 AM Medical Oncology and Hematology [facility] [address] [place],  [address] Tel. [phone]    Fax. [phone]  I, Soijett Blue, am acting as scribe for Chauncey Cruel MD.  I, Lurline Del MD, have reviewed the above documentation for accuracy and completeness, and I agree with the above.

## 2018-03-04 ENCOUNTER — Inpatient Hospital Stay (HOSPITAL_BASED_OUTPATIENT_CLINIC_OR_DEPARTMENT_OTHER): Payer: Medicare Other | Admitting: Oncology

## 2018-03-04 ENCOUNTER — Telehealth: Payer: Self-pay

## 2018-03-04 ENCOUNTER — Telehealth: Payer: Self-pay | Admitting: Oncology

## 2018-03-04 ENCOUNTER — Inpatient Hospital Stay: Payer: Medicare Other | Attending: Oncology

## 2018-03-04 ENCOUNTER — Inpatient Hospital Stay: Payer: Medicare Other

## 2018-03-04 VITALS — BP 140/74 | HR 90 | Temp 98.2°F | Resp 18 | Ht 61.5 in | Wt 122.3 lb

## 2018-03-04 DIAGNOSIS — C50412 Malignant neoplasm of upper-outer quadrant of left female breast: Secondary | ICD-10-CM

## 2018-03-04 DIAGNOSIS — D0512 Intraductal carcinoma in situ of left breast: Secondary | ICD-10-CM | POA: Insufficient documentation

## 2018-03-04 DIAGNOSIS — Z853 Personal history of malignant neoplasm of breast: Secondary | ICD-10-CM | POA: Insufficient documentation

## 2018-03-04 DIAGNOSIS — M810 Age-related osteoporosis without current pathological fracture: Secondary | ICD-10-CM | POA: Diagnosis not present

## 2018-03-04 DIAGNOSIS — Z5111 Encounter for antineoplastic chemotherapy: Secondary | ICD-10-CM | POA: Insufficient documentation

## 2018-03-04 DIAGNOSIS — Z87891 Personal history of nicotine dependence: Secondary | ICD-10-CM

## 2018-03-04 DIAGNOSIS — Z170 Estrogen receptor positive status [ER+]: Secondary | ICD-10-CM | POA: Diagnosis not present

## 2018-03-04 DIAGNOSIS — C50919 Malignant neoplasm of unspecified site of unspecified female breast: Secondary | ICD-10-CM

## 2018-03-04 DIAGNOSIS — C7951 Secondary malignant neoplasm of bone: Secondary | ICD-10-CM

## 2018-03-04 DIAGNOSIS — C50912 Malignant neoplasm of unspecified site of left female breast: Secondary | ICD-10-CM

## 2018-03-04 LAB — CBC WITH DIFFERENTIAL/PLATELET
ABS IMMATURE GRANULOCYTES: 0.01 10*3/uL (ref 0.00–0.07)
BASOS PCT: 1 %
Basophils Absolute: 0 10*3/uL (ref 0.0–0.1)
Eosinophils Absolute: 0.1 10*3/uL (ref 0.0–0.5)
Eosinophils Relative: 2 %
HEMATOCRIT: 43.3 % (ref 36.0–46.0)
Hemoglobin: 14.6 g/dL (ref 12.0–15.0)
Immature Granulocytes: 0 %
LYMPHS ABS: 1.1 10*3/uL (ref 0.7–4.0)
Lymphocytes Relative: 25 %
MCH: 31.1 pg (ref 26.0–34.0)
MCHC: 33.7 g/dL (ref 30.0–36.0)
MCV: 92.1 fL (ref 80.0–100.0)
MONO ABS: 0.4 10*3/uL (ref 0.1–1.0)
MONOS PCT: 9 %
NEUTROS ABS: 2.7 10*3/uL (ref 1.7–7.7)
Neutrophils Relative %: 63 %
PLATELETS: 219 10*3/uL (ref 150–400)
RBC: 4.7 MIL/uL (ref 3.87–5.11)
RDW: 13.7 % (ref 11.5–15.5)
WBC: 4.3 10*3/uL (ref 4.0–10.5)
nRBC: 0 % (ref 0.0–0.2)

## 2018-03-04 LAB — COMPREHENSIVE METABOLIC PANEL
ALT: 15 U/L (ref 0–44)
ANION GAP: 9 (ref 5–15)
AST: 20 U/L (ref 15–41)
Albumin: 3.8 g/dL (ref 3.5–5.0)
Alkaline Phosphatase: 37 U/L — ABNORMAL LOW (ref 38–126)
BILIRUBIN TOTAL: 0.4 mg/dL (ref 0.3–1.2)
BUN: 14 mg/dL (ref 8–23)
CALCIUM: 10.3 mg/dL (ref 8.9–10.3)
CO2: 27 mmol/L (ref 22–32)
Chloride: 105 mmol/L (ref 98–111)
Creatinine, Ser: 0.69 mg/dL (ref 0.44–1.00)
GFR calc non Af Amer: >60 mL/min (ref >60)
Glucose, Bld: 81 mg/dL (ref 70–99)
Potassium: 4.4 mmol/L (ref 3.5–5.1)
Sodium: 141 mmol/L (ref 135–145)
TOTAL PROTEIN: 7.1 g/dL (ref 6.5–8.1)

## 2018-03-04 MED ORDER — FULVESTRANT 250 MG/5ML IM SOLN
INTRAMUSCULAR | Status: AC
  Filled 2018-03-04: qty 10

## 2018-03-04 MED ORDER — PALBOCICLIB 75 MG PO CAPS: 75.0000 mg | ORAL_CAPSULE | Freq: Every day | ORAL | 12 refills | Status: DC

## 2018-03-04 MED ORDER — FULVESTRANT 250 MG/5ML IM SOLN
500.0000 mg | Freq: Once | INTRAMUSCULAR | Status: AC
  Administered 2018-03-04: 500 mg via INTRAMUSCULAR

## 2018-03-04 NOTE — Telephone Encounter (Signed)
Gave pt avs and calendar  °

## 2018-03-04 NOTE — Patient Instructions (Signed)

## 2018-03-04 NOTE — Telephone Encounter (Signed)
Oral Oncology Patient Advocate Encounter  PAF has suspended the approval process due to not getting financial information that the patient did not want to turn in. I have secured her another grant that will be in a separate encounter.  [place] Patient [place] Phone [phone] Fax [phone]

## 2018-03-04 NOTE — Telephone Encounter (Signed)
Oral Oncology Patient Advocate April Rosales copay is [phone]. I was able to secure a grant with [employer]  [facility]) for $5500. Approved 12/04/17-03/04/2019. This grant information is as follows and has been shared with [name].  BIN: [id number] Group: [id number] ID: [id number] PCN: [place] Patient [name] Phone [phone] Fax [phone]

## 2018-03-05 DIAGNOSIS — Z23 Encounter for immunization: Secondary | ICD-10-CM | POA: Diagnosis not present

## 2018-03-08 ENCOUNTER — Telehealth: Payer: Self-pay | Admitting: Pharmacist

## 2018-03-08 DIAGNOSIS — Z170 Estrogen receptor positive status [ER+]: Principal | ICD-10-CM

## 2018-03-08 DIAGNOSIS — C50412 Malignant neoplasm of upper-outer quadrant of left female breast: Secondary | ICD-10-CM

## 2018-03-08 MED ORDER — PALBOCICLIB 75 MG PO CAPS: ORAL_CAPSULE | ORAL | 12 refills | Status: DC

## 2018-03-08 NOTE — Telephone Encounter (Signed)
Oral Oncology Pharmacist Encounter  Received new prescription for Ibrance (palbociclib) for the treatment of metastatic, hormone receptor positive breast cancer in conjunction with Faslodex, planned duration until disease progression or unacceptable toxicity.  Patient was to start treatment with Verzenio but has elected to re-start Ibrance 75mg  capsules taken on a 3 week on, 1 week off schedule, to take Ibrance every other day during the "on" portion.  Labs from 03/04/2018 assessed, OK for treatment.  Current medication list in Epic reviewed, no DDIs with Ibrance identified.  Prescription has been e-scribed to the [facility] on 03/04/2018 by MD for benefits analysis and approval. Prior authorization is already on file for Ibrance.  Oral Oncology Patient Advocate has already secured cop[aymenty grant to cover any out of pocket expenses for Ibrance due at the pharmacy.  Oral Oncology Clinic will continue to follow for initial counseling and start date.  Johny Drilling, PharmD, BCPS, BCOP  03/08/2018 11:18 AM Oral Oncology Clinic [phone]

## 2018-03-08 NOTE — Telephone Encounter (Signed)
Oral Chemotherapy Pharmacist Encounter   I spoke with patient for overview of: Ibrance (palbociclib) restart for the treatment of metastatic, hormone receptor positive breast cancerin conjunction with Faslodex, planned durationuntil disease progression or unacceptable toxicity.  Counseled patient on administration, dosing, side effects, monitoring, drug-food interactions, safe handling, storage, and disposal.  Patient will take Ibrance 75mg  capsules, 1 capsule by mouth once every other day with breakfast for 3 weeks on, 1 week off.  Patient knows to avoid grapefruit and grapefruit juice.  Patient remains on Faslodex.  Ibrance re-start date: 04/02/2018  Adverse effects include but are not limited to: fatigue, hair loss, GI upset, nausea, decreased blood counts, and increased upper respiratory infections. Patient has anti diarrheal and alert the office of 4 or more loose stools above baseline.  Reviewed with patient importance of keeping a medication schedule and plan for any missed doses.  April Rosales voiced understanding and appreciation.   All questions answered. Medication reconciliation performed and medication/allergy list updated.  April Rosales will pick up her fill of Ibrance after office visit with Dr. Jana Hakim on 04/01/2018. Patient aware of copayment grant already secured by oral oncology patient advocate and understands that co-pay due at the pharmacy for her [facility]lee [facility] will be $0. She will restart Ibrance on Friday morning, 04/02/2018.  April Rosales expressed dissatisfaction when instructed that she would continue on Ibrance indefinitely and stated that she only agreed to go back on the medication for 1 month. Patient instructed to follow-up with Dr. Jana Hakim on length of Ibrance therapy when she is back in the office in November.  Patient knows to call the office with questions or concerns. Oral Oncology Clinic will continue to follow.  April Rosales, PharmD, BCPS, BCOP   03/08/2018   12:23 PM Oral Oncology Clinic [phone]

## 2018-04-01 ENCOUNTER — Other Ambulatory Visit: Payer: Medicare Other

## 2018-04-01 ENCOUNTER — Inpatient Hospital Stay (HOSPITAL_BASED_OUTPATIENT_CLINIC_OR_DEPARTMENT_OTHER): Payer: Medicare Other | Admitting: Adult Health

## 2018-04-01 ENCOUNTER — Encounter: Payer: Self-pay | Admitting: Adult Health

## 2018-04-01 ENCOUNTER — Inpatient Hospital Stay: Payer: Medicare Other | Attending: Oncology

## 2018-04-01 ENCOUNTER — Ambulatory Visit: Payer: Medicare Other

## 2018-04-01 ENCOUNTER — Inpatient Hospital Stay: Payer: Medicare Other

## 2018-04-01 ENCOUNTER — Telehealth: Payer: Self-pay | Admitting: Adult Health

## 2018-04-01 VITALS — BP 136/75 | HR 81 | Temp 98.5°F | Resp 18 | Ht 61.5 in | Wt 123.2 lb

## 2018-04-01 DIAGNOSIS — Z170 Estrogen receptor positive status [ER+]: Secondary | ICD-10-CM | POA: Diagnosis not present

## 2018-04-01 DIAGNOSIS — C7951 Secondary malignant neoplasm of bone: Secondary | ICD-10-CM | POA: Insufficient documentation

## 2018-04-01 DIAGNOSIS — M810 Age-related osteoporosis without current pathological fracture: Secondary | ICD-10-CM | POA: Insufficient documentation

## 2018-04-01 DIAGNOSIS — D0512 Intraductal carcinoma in situ of left breast: Secondary | ICD-10-CM | POA: Insufficient documentation

## 2018-04-01 DIAGNOSIS — Z5111 Encounter for antineoplastic chemotherapy: Secondary | ICD-10-CM | POA: Diagnosis not present

## 2018-04-01 DIAGNOSIS — C50919 Malignant neoplasm of unspecified site of unspecified female breast: Secondary | ICD-10-CM

## 2018-04-01 DIAGNOSIS — C50412 Malignant neoplasm of upper-outer quadrant of left female breast: Secondary | ICD-10-CM

## 2018-04-01 DIAGNOSIS — Z87891 Personal history of nicotine dependence: Secondary | ICD-10-CM

## 2018-04-01 DIAGNOSIS — M25552 Pain in left hip: Secondary | ICD-10-CM

## 2018-04-01 DIAGNOSIS — M25551 Pain in right hip: Secondary | ICD-10-CM | POA: Diagnosis not present

## 2018-04-01 DIAGNOSIS — C50912 Malignant neoplasm of unspecified site of left female breast: Secondary | ICD-10-CM

## 2018-04-01 LAB — CBC WITH DIFFERENTIAL/PLATELET
ABS IMMATURE GRANULOCYTES: 0.01 10*3/uL (ref 0.00–0.07)
Basophils Absolute: 0.1 10*3/uL (ref 0.0–0.1)
Basophils Relative: 1 %
Eosinophils Absolute: 0.2 10*3/uL (ref 0.0–0.5)
Eosinophils Relative: 3 %
HCT: 42.9 % (ref 36.0–46.0)
HEMOGLOBIN: 14.2 g/dL (ref 12.0–15.0)
IMMATURE GRANULOCYTES: 0 %
LYMPHS PCT: 24 %
Lymphs Abs: 1.1 10*3/uL (ref 0.7–4.0)
MCH: 30.7 pg (ref 26.0–34.0)
MCHC: 33.1 g/dL (ref 30.0–36.0)
MCV: 92.9 fL (ref 80.0–100.0)
MONO ABS: 0.5 10*3/uL (ref 0.1–1.0)
MONOS PCT: 10 %
NEUTROS ABS: 2.8 10*3/uL (ref 1.7–7.7)
NEUTROS PCT: 62 %
Platelets: 211 10*3/uL (ref 150–400)
RBC: 4.62 MIL/uL (ref 3.87–5.11)
RDW: 13.5 % (ref 11.5–15.5)
WBC: 4.6 10*3/uL (ref 4.0–10.5)
nRBC: 0 % (ref 0.0–0.2)

## 2018-04-01 LAB — COMPREHENSIVE METABOLIC PANEL
ALK PHOS: 38 U/L (ref 38–126)
ALT: 14 U/L (ref 0–44)
AST: 19 U/L (ref 15–41)
Albumin: 3.6 g/dL (ref 3.5–5.0)
Anion gap: 6 (ref 5–15)
BUN: 13 mg/dL (ref 8–23)
CALCIUM: 10.2 mg/dL (ref 8.9–10.3)
CHLORIDE: 106 mmol/L (ref 98–111)
CO2: 28 mmol/L (ref 22–32)
CREATININE: 0.7 mg/dL (ref 0.44–1.00)
GFR calc Af Amer: >60 mL/min (ref >60)
GFR calc non Af Amer: >60 mL/min (ref >60)
GLUCOSE: 77 mg/dL (ref 70–99)
Potassium: 4.2 mmol/L (ref 3.5–5.1)
SODIUM: 140 mmol/L (ref 135–145)
Total Bilirubin: 0.3 mg/dL (ref 0.3–1.2)
Total Protein: 6.8 g/dL (ref 6.5–8.1)

## 2018-04-01 MED ORDER — FULVESTRANT 250 MG/5ML IM SOLN
500.0000 mg | Freq: Once | INTRAMUSCULAR | Status: AC
  Administered 2018-04-01: 500 mg via INTRAMUSCULAR

## 2018-04-01 MED ORDER — FULVESTRANT 250 MG/5ML IM SOLN
INTRAMUSCULAR | Status: AC
  Filled 2018-04-01: qty 5

## 2018-04-01 MED ORDER — DENOSUMAB 120 MG/1.7ML ~~LOC~~ SOLN
SUBCUTANEOUS | Status: AC
  Filled 2018-04-01: qty 1.7

## 2018-04-01 MED ORDER — DENOSUMAB 120 MG/1.7ML ~~LOC~~ SOLN
120.0000 mg | Freq: Once | SUBCUTANEOUS | Status: AC
  Administered 2018-04-01: 120 mg via SUBCUTANEOUS

## 2018-04-01 NOTE — Telephone Encounter (Signed)
Gave patient avs and calendar.   °

## 2018-04-01 NOTE — Progress Notes (Signed)
[facility]  [phone] [phone]   ID: [name] DOB: [birth year]  MR#: [id number]  LHT#:[id number]  Patient Care Team: [name], MD as PCP - General (Internal Medicine) Magrinat, [name], MD as Consulting Physician (Oncology) [name], MD as Consulting Physician (Orthopedic Surgery) [name], MD as Consulting Physician (General Surgery) PCP: [name], MD  OTHER: [name], DDS  CHIEF COMPLAINT: Stage IV estrogen receptor positive breast cancer  CURRENT TREATMENT:   Fulvestrant, denosumab/Xgeva;   INTERVAL HISTORY: April Rosales returns today for follow-up and treatment of her estrogen receptor positive breast cancer accompanied by her family member. She receives fulvestrant every 4 weeks with her most recent dose on 03/04/2018. She tolerates this well.  She notes an increase in arthralgias in her hips.    She also receives denosumab/Xgeva every 8 weeks with her most recent dose on 02/04/2018. She also tolerates this well.  Erin tells me that since her last appointment she has done some thinking and has decided she wants surgery with Dr. Dalbert Batman to get rid of the cancer in her breasts.  She really does not want to take the Palbociclib that was recommended.     REVIEW OF SYSTEMS: [name] is doing well otherwise.  She denies any headaches or vision changes.  She is without fevers, chills, or pain.  She denies cough, shortness of breath, palpitations or chest pain.  She isn't having bowel/bladder changes, nausea, vomiting, or pelvic issues.  A detailed ROS was otherwise non contributory.    BREAST CANCER HISTORY: From the original intake note:   [name] underwent right lumpectomy in 1985 at [facility] for what sounds like a stage I breast cancer. She tells me she had more than 30 lymph nodes removed from her right axilla and all of them were clear. She received  adjuvant radiation but no systemic  treatment.  The patient had recently refused mammography with the last mammogram I can find dating back to August 2010.  More recently the patient presented with left scapular pain radiating down the left arm.  She was evaluated by Dr. [name], who obtained a chest x-ray showing a possible abnormality at T3. He then set up the patient for an MRI of the thoracic spine performed 08/16/2014.  This showed multiple compression fractures  (a similar picture had been noted on lumbar MRI 10/09/2011 ). However at T3 they noted tumor in the vertebral body extending into the left pedicle and into the lateral epidural space, displacing the cord to the right. There was no evidence of cord compression or cord signal abnormality. There were no other areas of tumor identified in the thoracic spine.         The patient was then referred to Dr. Hal Neer who on 08/24/2014 set [name] up for CT scans of the chest, abdomen and pelvis. There was a dense mass in the upper inner quadrant of the left breast measuring 1.6 cm. There was a 1.2 cm nodule in the minor fissure of the right lung and some evidence of right lung fibrosis at the site of the prior radiation port.  There was also a thyroid mass measuring 2 cm. However there were no parenchymal lung or liver lesions. Incidental meningoceles were noted as well as sclerosis of the fifth and sixth ribs which were felt to be likely posttraumatic.   On 08/29/2014 the patient underwent bilateral diagnostic mammography with tomosynthesis and left breast ultrasonography.  There were postsurgical changes in the  upper right breast. In the left breast there was an irregular mass measuring 2.3 cm in the upper inner quadrant. This was palpable. Ultrasound showed this to be hypoechoic and to measure 2.0 cm. There were adjacent areas of nodularity. There was no definite lymphadenopathy in the left axilla.  Biopsy of this breast mass 08/29/2014 showed an invasive adenocarcinoma with both ductal  and lobular features (there was strong diffuse E-cadherin expression as well as areas with total absence of E-cadherin expression), with the preliminary prognostic profile showing strong estrogen positivity, very weak to near absent progesterone positivity, an MIB-1 of approximately 40%, and HER-2 equivocal  The patient's subsequent history is as detailed below.   PAST MEDICAL HISTORY: Past Medical History:  Diagnosis Date  . Arthritis   . Breast cancer [facility]) 1985/2016   takes [name] daily  . Chronic back pain    stenosis/listhesis  . Family history of ovarian cancer   . Osteoporosis    takes Vit D  . Radiation 11/23/14-12/11/14   30 Gy T1-T5     PAST SURGICAL HISTORY: Past Surgical History:  Procedure Laterality Date  . ABDOMINAL HYSTERECTOMY     1980  . APPENDECTOMY  1977  . APPLICATION OF INTRAOPERATIVE CT SCAN N/A 10/20/2014   Procedure: APPLICATION OF INTRAOPERATIVE CAT SCAN;  Surgeon: [name], MD;  Location: [place] [facility];  Service: Neurosurgery;  Laterality: N/A;  . BREAST SURGERY Right 1985  . THYROID CYST EXCISION  1967    FAMILY HISTORY Family History  Problem Relation Age of Onset  . Heart disease Mother   . Hypertension Mother   . Heart disease Father   . Diabetes Father   . Breast cancer Paternal Aunt        4 paternal aunts with breast cancer over 91  . Prostate cancer Paternal Uncle   . Stroke Paternal Grandfather   . Ovarian cancer Paternal Aunt   . Huntington's disease Other        Nephew, inherited from his father   The patient's father died from a heart attack at the age of 43. He had 9 sisters. 3 of those sisters had breast cancer, all in a menopausal setting. Another sister had ovarian cancer. One of the paternal uncles had cancer of the colon "and back".  The patient's mother died at the age of 56. She was found to have breast cancer shortly before dying , during her final hospitalization.  GYNECOLOGIC HISTORY:  No LMP recorded. Patient has  had a hysterectomy.  Menarche age 47, first live birth age 50, the patient is GX P1. She underwent hysterectomy in 1980. She thinks the ovaries were removed, but the CT scan obtained 08/24/2014 showed a definite right ovary. The left ovary may have been removed. She did not take hormone replacement after the hysterectomy.  SOCIAL HISTORY:   Akirah worked in Psychiatric nurse. She is divorced, lives alone, with 2 cats. Her son Bethann Berkshire lives in [place] where he works in Engineer, technical sales. He has 4 children of his own. The patient is a Psychologist, forensic.    ADVANCED DIRECTIVES:  In place. The patient has named her sister Pleas Koch [phone]) and her friend Janina Mayo [phone]) as joint healthcare powers of attorney   HEALTH MAINTENANCE: Social History   Tobacco Use  . Smoking status: Former Smoker    Packs/day: 0.50    Years: 10.00    Pack years: 5.00    Types: Cigarettes  . Smokeless tobacco: Former Systems developer  . Tobacco comment: quit  smoking 1970's  Substance Use Topics  . Alcohol use: Yes    Alcohol/week: 0.0 standard drinks    Comment: Rare  . Drug use: No     Colonoscopy: never  PAP: status post hysterectomy  Bone density: 09/12/2016 [facility]/ T score of -4.8 osteoporosis  Lipid panel:  Allergies  Allergen Reactions  . Ativan [Lorazepam]     Made her crazy  . Dilaudid [Hydromorphone Hcl]     Doesn't want  . Haldol [Haloperidol Lactate]     Made her crazy    Current Outpatient Medications  Medication Sig Dispense Refill  . Ascorbic Acid (VITAMIN C PO) Take 1 tablet by mouth every other day. Takes qod    . aspirin EC 325 MG tablet Take 1 tablet (325 mg total) by mouth daily. 30 tablet 0  . Cholecalciferol (VITAMIN D3 ADULT GUMMIES PO) Take 2,000 Units by mouth daily.     . Cyanocobalamin (VITAMIN B-12 PO) Take by mouth daily. Takes every other day    . gabapentin (NEURONTIN) 100 MG capsule TAKE 1-3 CAPSULES BY MOUTH ONCE DURING THE DAYTIME AS NEEDED FOR PAIN(CONTINUE  TAKING 300MG SCRIPT AT BEDTIME) 90 capsule 0  . Iron-Vitamins (GERITOL PO) Take by mouth daily.    . palbociclib (IBRANCE) 75 MG capsule Take 1 capsule (62m) by mouth with breakfast every other day for 3 weeks on, 1 week off, and repeated.Take whole with food. (Patient not taking: Reported on 04/01/2018) 11 capsule 12   No current facility-administered medications for this visit.     OBJECTIVE:   Vitals:   04/01/18 1115  BP: 136/75  Pulse: 81  Resp: 18  Temp: 98.5 F (36.9 C)  SpO2: 97%     Body mass index is 22.9 kg/m.    ECOG FS:1 - Symptomatic but completely ambulatory  GENERAL: Patient is a well appearing female in no acute distress HEENT:  Sclerae anicteric.  Oropharynx clear and moist. No ulcerations or evidence of oropharyngeal candidiasis. Neck is supple.  NODES:  No cervical, supraclavicular, or axillary lymphadenopathy palpated.  BREAST EXAM:  Right breast s/p lumpectomy, no sign of recurrence, left breast--unable to palpate a definite area of concern LUNGS:  Clear to auscultation bilaterally.  No wheezes or rhonchi. HEART:  Regular rate and rhythm. No murmur appreciated. ABDOMEN:  Soft, nontender.  Positive, normoactive bowel sounds. No organomegaly palpated. MSK:  No focal spinal tenderness to palpation. Full range of motion bilaterally in the upper extremities. EXTREMITIES:  No peripheral edema.   SKIN:  Clear with no obvious rashes or skin changes. No nail dyscrasia. NEURO:  Nonfocal. Well oriented.  Appropriate affect.    LAB RESULTS:  CMP     Component Value Date/Time   NA 141 03/04/2018 1058   NA 141 05/29/2017 1417   K 4.4 03/04/2018 1058   K 3.6 05/29/2017 1417   CL 105 03/04/2018 1058   CO2 27 03/04/2018 1058   CO2 27 05/29/2017 1417   GLUCOSE 81 03/04/2018 1058   GLUCOSE 111 05/29/2017 1417   BUN 14 03/04/2018 1058   BUN 17.0 05/29/2017 1417   CREATININE 0.69 03/04/2018 1058   CREATININE 0.8 05/29/2017 1417   CALCIUM 10.3 03/04/2018 1058    CALCIUM 10.7 (H) 06/26/2017 1216   CALCIUM 10.4 05/29/2017 1417   PROT 7.1 03/04/2018 1058   PROT 7.3 05/29/2017 1417   ALBUMIN 3.8 03/04/2018 1058   ALBUMIN 3.9 05/29/2017 1417   AST 20 03/04/2018 1058   AST 17 05/29/2017 1417  ALT 15 03/04/2018 1058   ALT 15 05/29/2017 1417   ALKPHOS 37 (L) 03/04/2018 1058   ALKPHOS 42 05/29/2017 1417   BILITOT 0.4 03/04/2018 1058   BILITOT 0.30 05/29/2017 1417   GFRNONAA >60 03/04/2018 1058   GFRNONAA 85 07/07/2016 1125   GFRAA >60 03/04/2018 1058   GFRAA >89 07/07/2016 1125    INo results found for: SPEP, UPEP  Lab Results  Component Value Date   WBC 4.6 04/01/2018   NEUTROABS 2.8 04/01/2018   HGB 14.2 04/01/2018   HCT 42.9 04/01/2018   MCV 92.9 04/01/2018   PLT 211 04/01/2018      Chemistry      Component Value Date/Time   NA 141 03/04/2018 1058   NA 141 05/29/2017 1417   K 4.4 03/04/2018 1058   K 3.6 05/29/2017 1417   CL 105 03/04/2018 1058   CO2 27 03/04/2018 1058   CO2 27 05/29/2017 1417   BUN 14 03/04/2018 1058   BUN 17.0 05/29/2017 1417   CREATININE 0.69 03/04/2018 1058   CREATININE 0.8 05/29/2017 1417      Component Value Date/Time   CALCIUM 10.3 03/04/2018 1058   CALCIUM 10.7 (H) 06/26/2017 1216   CALCIUM 10.4 05/29/2017 1417   ALKPHOS 37 (L) 03/04/2018 1058   ALKPHOS 42 05/29/2017 1417   AST 20 03/04/2018 1058   AST 17 05/29/2017 1417   ALT 15 03/04/2018 1058   ALT 15 05/29/2017 1417   BILITOT 0.4 03/04/2018 1058   BILITOT 0.30 05/29/2017 1417       Lab Results  Component Value Date   LABCA2 24 01/01/2016    No components found for: [id number]  No results for input(s): INR in the last 168 hours.  Urinalysis    Component Value Date/Time   COLORURINE YELLOW 11/25/2017 1143   APPEARANCEUR CLEAR 11/25/2017 1143   LABSPEC 1.016 11/25/2017 1143   PHURINE 7.0 11/25/2017 1143   GLUCOSEU NEGATIVE 11/25/2017 1143   HGBUR NEGATIVE 11/25/2017 1143   BILIRUBINUR NEGATIVE 09/19/2015 1239   KETONESUR  NEGATIVE 11/25/2017 1143   PROTEINUR NEGATIVE 11/25/2017 1143   NITRITE NEGATIVE 11/25/2017 1143   LEUKOCYTESUR 1+ (A) 11/25/2017 1143    STUDIES: No results found.  ASSESSMENT: 77 y.o. [place] woman with stage IV left breast cancer involving bone  (1) status post right lumpectomy and axillary node dissection in 1985 followed by radiation at [facility] 08/16/2014: measurable disease in spine, lung and left breast   (2) evaluation for left shoulder pain led to thoracic spine MRI 08/16/2014 showing a pathologic fracture at T3 with epidural tumor displacing the cord to the right, but no cord compression. CT scans of the chest, abdomen and pelvis 08/24/2014 showed in addition a mass in the upper outer quadrant left breast measuring 1.6 cm and a nodule in the minor fissure of the right lung measuring 1.2 cm, but no parenchymal lung or liver lesions.   (a) CA 27-29 was noninformative at 38 (09/20/2014)    (3) mammography and ultrasonography 08/29/2014 show a mass in the upper inner left breast which was palpable,  measuring 2.0 cm by ultrasound. Biopsy of this mass 08/29/2014 showed an invasive breast cancer with both lobular and ductal features, estrogen receptor positive, progesterone receptor weakly positive, with an MIB-1 in the 40% range, HER-2 equivocal (6 else ratio 1.5, but average number her nucleus 5.8)   (4) letrozole started 08/31/2014;   (a) palbociclib added Sept 2016 at 75 mg 21/7, with significant neutropenia; not  repeated after first cycle  (b) letrozole discontinued 05/29/2017 with evidence of disease progression in the breast   (5)  zolendronate started 09/20/2014, stopped after initial dose due to poor tolerance  (a) denosumab/ Xgeva started 01/31/2015, repeated every 4 weeks  (b) changed to every 8 weeks as of August 2018.    (6) on 10/20/2014 the patient underwent T2-T3 and T4 decompressive laminectomy with removal of epidural tumor, C7-T4 segmental pedicle  screw instrumentation with virage screw system with arrow guidance protocol and C7-T4 posterolateral fusion. The cells were positive for the estrogen receptor. HER-2/neu testing by [facility] showed again equivocal results,  (a) most recent bone scan 10/01/2016 finds no new lesions only postoperative changes  (7) radiation 11/23/2014-12/11/2014.  (a) T1-T5 was treated to 30 Gy in 12 fractions at 2.5 Gy per fraction   (8)  consider eventual left lumpectomy or mastectomy depending on  longer-term results of systemic therapy  (a) most recent left breast ultrasonography 10/30/2016 found this mass to measure 0.7 cm  (b) repeat left breast ultrasonography 05/13/2017 showed the upper outer quadrant mass to have grown to 1.4 cm  (c) left breast ultrasound 09/08/2017 shows the mass to now measure 0.9 cm  (d) left breast biopsy x2 on 12/11/2017 shows high-grade ductal carcinoma in situ, essentially estrogen receptor negative  (9) genetics testing using the Breast/Ovarian Cancer Panel through [facility] [name], MD) found no deleterious mutations in ATM, BARD1, BRCA1, BRCA2, BRIP1, CDH1, CHEK2, EPCAM, FANCC, MLH1, MSH2, MSH6, NBN, PALB2, PMS2, PTEN, RAD51C, RAD51D, STK11, TP53, or XRCC2    (10) right thyroid nodule is a complex cyst as noted on CT scan of the neck 01/29/2016  (11) osteoporosis: [facility] density at [facility] 09/12/2016 showed a T score of -4.8.  (a) continue denosumab/Xgeva as above  (12) fulvestrant started 05/29/2017  (a) started palbociclib 06/29/2017 at 75 mg every other day for 21 days on, 7 days off  (b) palbociclib discontinued on 3/29 due to progressive fatigue and patient preference  (13) considering left lumpectomy vs mastectomy  (14) was 2 to start abemaciclib 02/04/2018, but patient opted for going back to palbociclib at a lower dose beginning in November 19, patient now declines Palbociclib, and will proceed to surgery   PLAN: Daysia is doing well today.  She continues on  Fulvestrant and Xgeva with good tolerance and she will continue this. We reviewed her concern for not restarting the Palbociclib, along with the desire to proceed with surgery.  I reviewed this with Dr. Jana Hakim who is agreeable and on board.  I will send a message to Dr. Dalbert Batman and his staff about getting her scheduled for surgery.    Terria will continue with the Fulvestrant every 4 weeks, and the Xgeva every 8 weeks.  She will see Dr. Jana Hakim on 06/24/18 instead of 12/5.  I reviewed that with her in detail.    She knows to call for any other issues that may develop before the next visit.  A total of (20) minutes of face-to-face time was spent with this patient with greater than 50% of that time in counseling and care-coordination.   Wilber Bihari, NP  04/01/18 11:30 AM Medical Oncology and Hematology [facility] [address] [place], [place] [address] Tel. [phone]    Fax. [phone]

## 2018-04-01 NOTE — Patient Instructions (Signed)
Fulvestrant injection What is this medicine? FULVESTRANT (ful VES trant) blocks the effects of estrogen. It is used to treat breast cancer. This medicine may be used for other purposes; ask your health care provider or pharmacist if you have questions. COMMON BRAND NAME(S): FASLODEX What should I tell my health care provider before I take this medicine? They need to know if you have any of these conditions: -bleeding problems -liver disease -low levels of platelets in the blood -an unusual or allergic reaction to fulvestrant, other medicines, foods, dyes, or preservatives -pregnant or trying to get pregnant -breast-feeding How should I use this medicine? This medicine is for injection into a muscle. It is usually given by a health care professional in a hospital or clinic setting. Talk to your pediatrician regarding the use of this medicine in children. Special care may be needed. Overdosage: If you think you have taken too much of this medicine contact a poison control center or emergency room at once. NOTE: This medicine is only for you. Do not share this medicine with others. What if I miss a dose? It is important not to miss your dose. Call your doctor or health care professional if you are unable to keep an appointment. What may interact with this medicine? -medicines that treat or prevent blood clots like warfarin, enoxaparin, and dalteparin This list may not describe all possible interactions. Give your health care provider a list of all the medicines, herbs, non-prescription drugs, or dietary supplements you use. Also tell them if you smoke, drink alcohol, or use illegal drugs. Some items may interact with your medicine. What should I watch for while using this medicine? Your condition will be monitored carefully while you are receiving this medicine. You will need important blood work done while you are taking this medicine. Do not become pregnant while taking this medicine or for  at least 1 year after stopping it. Women of child-bearing potential will need to have a negative pregnancy test before starting this medicine. Women should inform their doctor if they wish to become pregnant or think they might be pregnant. There is a potential for serious side effects to an unborn child. Men should inform their doctors if they wish to father a child. This medicine may lower sperm counts. Talk to your health care professional or pharmacist for more information. Do not breast-feed an infant while taking this medicine or for 1 year after the last dose. What side effects may I notice from receiving this medicine? Side effects that you should report to your doctor or health care professional as soon as possible: -allergic reactions like skin rash, itching or hives, swelling of the face, lips, or tongue -feeling faint or lightheaded, falls -pain, tingling, numbness, or weakness in the legs -signs and symptoms of infection like fever or chills; cough; flu-like symptoms; sore throat -vaginal bleeding Side effects that usually do not require medical attention (report to your doctor or health care professional if they continue or are bothersome): -aches, pains -constipation -diarrhea -headache -hot flashes -nausea, vomiting -pain at site where injected -stomach pain This list may not describe all possible side effects. Call your doctor for medical advice about side effects. You may report side effects to FDA at 1-800-FDA-1088. Where should I keep my medicine? This drug is given in a hospital or clinic and will not be stored at home. NOTE: This sheet is a summary. It may not cover all possible information. If you have questions about this medicine, talk to your   doctor, pharmacist, or health care provider.  2018 Elsevier/Gold Standard (2014-12-08 11:03:55) Denosumab injection What is this medicine? DENOSUMAB (den oh sue mab) slows bone breakdown. Prolia is used to treat osteoporosis in  women after menopause and in men. Xgeva is used to treat a high calcium level due to cancer and to prevent bone fractures and other bone problems caused by multiple myeloma or cancer bone metastases. Xgeva is also used to treat giant cell tumor of the bone. This medicine may be used for other purposes; ask your health care provider or pharmacist if you have questions. COMMON BRAND NAME(S): Prolia, XGEVA What should I tell my health care provider before I take this medicine? They need to know if you have any of these conditions: -dental disease -having surgery or tooth extraction -infection -kidney disease -low levels of calcium or Vitamin D in the blood -malnutrition -on hemodialysis -skin conditions or sensitivity -thyroid or parathyroid disease -an unusual reaction to denosumab, other medicines, foods, dyes, or preservatives -pregnant or trying to get pregnant -breast-feeding How should I use this medicine? This medicine is for injection under the skin. It is given by a health care professional in a hospital or clinic setting. If you are getting Prolia, a special MedGuide will be given to you by the pharmacist with each prescription and refill. Be sure to read this information carefully each time. For Prolia, talk to your pediatrician regarding the use of this medicine in children. Special care may be needed. For Xgeva, talk to your pediatrician regarding the use of this medicine in children. While this drug may be prescribed for children as young as 13 years for selected conditions, precautions do apply. Overdosage: If you think you have taken too much of this medicine contact a poison control center or emergency room at once. NOTE: This medicine is only for you. Do not share this medicine with others. What if I miss a dose? It is important not to miss your dose. Call your doctor or health care professional if you are unable to keep an appointment. What may interact with this  medicine? Do not take this medicine with any of the following medications: -other medicines containing denosumab This medicine may also interact with the following medications: -medicines that lower your chance of fighting infection -steroid medicines like prednisone or cortisone This list may not describe all possible interactions. Give your health care provider a list of all the medicines, herbs, non-prescription drugs, or dietary supplements you use. Also tell them if you smoke, drink alcohol, or use illegal drugs. Some items may interact with your medicine. What should I watch for while using this medicine? Visit your doctor or health care professional for regular checks on your progress. Your doctor or health care professional may order blood tests and other tests to see how you are doing. Call your doctor or health care professional for advice if you get a fever, chills or sore throat, or other symptoms of a cold or flu. Do not treat yourself. This drug may decrease your body's ability to fight infection. Try to avoid being around people who are sick. You should make sure you get enough calcium and vitamin D while you are taking this medicine, unless your doctor tells you not to. Discuss the foods you eat and the vitamins you take with your health care professional. See your dentist regularly. Brush and floss your teeth as directed. Before you have any dental work done, tell your dentist you are receiving this medicine. Do   not become pregnant while taking this medicine or for 5 months after stopping it. Talk with your doctor or health care professional about your birth control options while taking this medicine. Women should inform their doctor if they wish to become pregnant or think they might be pregnant. There is a potential for serious side effects to an unborn child. Talk to your health care professional or pharmacist for more information. What side effects may I notice from receiving this  medicine? Side effects that you should report to your doctor or health care professional as soon as possible: -allergic reactions like skin rash, itching or hives, swelling of the face, lips, or tongue -bone pain -breathing problems -dizziness -jaw pain, especially after dental work -redness, blistering, peeling of the skin -signs and symptoms of infection like fever or chills; cough; sore throat; pain or trouble passing urine -signs of low calcium like fast heartbeat, muscle cramps or muscle pain; pain, tingling, numbness in the hands or feet; seizures -unusual bleeding or bruising -unusually weak or tired Side effects that usually do not require medical attention (report to your doctor or health care professional if they continue or are bothersome): -constipation -diarrhea -headache -joint pain -loss of appetite -muscle pain -runny nose -tiredness -upset stomach This list may not describe all possible side effects. Call your doctor for medical advice about side effects. You may report side effects to FDA at 1-800-FDA-1088. Where should I keep my medicine? This medicine is only given in a clinic, doctor's office, or other health care setting and will not be stored at home. NOTE: This sheet is a summary. It may not cover all possible information. If you have questions about this medicine, talk to your doctor, pharmacist, or health care provider.  2018 Elsevier/Gold Standard (2016-06-03 19:17:21)  

## 2018-04-08 ENCOUNTER — Other Ambulatory Visit: Payer: Self-pay | Admitting: Oncology

## 2018-04-08 DIAGNOSIS — C7951 Secondary malignant neoplasm of bone: Secondary | ICD-10-CM

## 2018-04-14 ENCOUNTER — Other Ambulatory Visit: Payer: Self-pay | Admitting: General Surgery

## 2018-04-14 DIAGNOSIS — C50212 Malignant neoplasm of upper-inner quadrant of left female breast: Secondary | ICD-10-CM | POA: Diagnosis not present

## 2018-04-14 DIAGNOSIS — Z9049 Acquired absence of other specified parts of digestive tract: Secondary | ICD-10-CM | POA: Diagnosis not present

## 2018-04-14 DIAGNOSIS — E785 Hyperlipidemia, unspecified: Secondary | ICD-10-CM | POA: Diagnosis not present

## 2018-04-14 DIAGNOSIS — I10 Essential (primary) hypertension: Secondary | ICD-10-CM | POA: Diagnosis not present

## 2018-04-14 DIAGNOSIS — Z90710 Acquired absence of both cervix and uterus: Secondary | ICD-10-CM | POA: Diagnosis not present

## 2018-04-14 DIAGNOSIS — Z9079 Acquired absence of other genital organ(s): Secondary | ICD-10-CM | POA: Diagnosis not present

## 2018-04-14 DIAGNOSIS — Z853 Personal history of malignant neoplasm of breast: Secondary | ICD-10-CM | POA: Diagnosis not present

## 2018-04-14 DIAGNOSIS — E041 Nontoxic single thyroid nodule: Secondary | ICD-10-CM | POA: Diagnosis not present

## 2018-04-14 DIAGNOSIS — Z170 Estrogen receptor positive status [ER+]: Principal | ICD-10-CM

## 2018-04-14 DIAGNOSIS — Z90722 Acquired absence of ovaries, bilateral: Secondary | ICD-10-CM | POA: Diagnosis not present

## 2018-04-14 DIAGNOSIS — C50912 Malignant neoplasm of unspecified site of left female breast: Secondary | ICD-10-CM | POA: Diagnosis not present

## 2018-04-14 DIAGNOSIS — C7951 Secondary malignant neoplasm of bone: Secondary | ICD-10-CM | POA: Diagnosis not present

## 2018-04-15 ENCOUNTER — Other Ambulatory Visit: Payer: Self-pay | Admitting: General Surgery

## 2018-04-15 DIAGNOSIS — C50212 Malignant neoplasm of upper-inner quadrant of left female breast: Secondary | ICD-10-CM

## 2018-04-15 DIAGNOSIS — Z170 Estrogen receptor positive status [ER+]: Principal | ICD-10-CM

## 2018-04-20 ENCOUNTER — Encounter (HOSPITAL_BASED_OUTPATIENT_CLINIC_OR_DEPARTMENT_OTHER): Payer: Self-pay | Admitting: *Deleted

## 2018-04-20 ENCOUNTER — Other Ambulatory Visit: Payer: Self-pay

## 2018-04-29 ENCOUNTER — Inpatient Hospital Stay: Payer: Medicare Other

## 2018-04-29 ENCOUNTER — Ambulatory Visit: Payer: Medicare Other | Admitting: Oncology

## 2018-04-29 ENCOUNTER — Other Ambulatory Visit: Payer: Medicare Other

## 2018-04-29 ENCOUNTER — Inpatient Hospital Stay: Payer: Medicare Other | Attending: Oncology

## 2018-04-29 ENCOUNTER — Ambulatory Visit: Payer: Medicare Other

## 2018-04-29 DIAGNOSIS — M810 Age-related osteoporosis without current pathological fracture: Secondary | ICD-10-CM

## 2018-04-29 DIAGNOSIS — D0512 Intraductal carcinoma in situ of left breast: Secondary | ICD-10-CM | POA: Insufficient documentation

## 2018-04-29 DIAGNOSIS — Z5111 Encounter for antineoplastic chemotherapy: Secondary | ICD-10-CM | POA: Insufficient documentation

## 2018-04-29 DIAGNOSIS — C7951 Secondary malignant neoplasm of bone: Secondary | ICD-10-CM | POA: Diagnosis not present

## 2018-04-29 DIAGNOSIS — C50412 Malignant neoplasm of upper-outer quadrant of left female breast: Secondary | ICD-10-CM

## 2018-04-29 DIAGNOSIS — C50912 Malignant neoplasm of unspecified site of left female breast: Secondary | ICD-10-CM

## 2018-04-29 DIAGNOSIS — C50919 Malignant neoplasm of unspecified site of unspecified female breast: Secondary | ICD-10-CM

## 2018-04-29 LAB — CBC WITH DIFFERENTIAL/PLATELET
ABS IMMATURE GRANULOCYTES: 0 10*3/uL (ref 0.00–0.07)
BASOS ABS: 0.1 10*3/uL (ref 0.0–0.1)
Basophils Relative: 1 %
Eosinophils Absolute: 0.2 10*3/uL (ref 0.0–0.5)
Eosinophils Relative: 4 %
HCT: 44 % (ref 36.0–46.0)
Hemoglobin: 14.7 g/dL (ref 12.0–15.0)
IMMATURE GRANULOCYTES: 0 %
LYMPHS PCT: 19 %
Lymphs Abs: 1 10*3/uL (ref 0.7–4.0)
MCH: 30.8 pg (ref 26.0–34.0)
MCHC: 33.4 g/dL (ref 30.0–36.0)
MCV: 92.1 fL (ref 80.0–100.0)
Monocytes Absolute: 0.4 10*3/uL (ref 0.1–1.0)
Monocytes Relative: 8 %
NEUTROS ABS: 3.5 10*3/uL (ref 1.7–7.7)
NEUTROS PCT: 68 %
NRBC: 0 % (ref 0.0–0.2)
Platelets: 235 10*3/uL (ref 150–400)
RBC: 4.78 MIL/uL (ref 3.87–5.11)
RDW: 13.4 % (ref 11.5–15.5)
WBC: 5.2 10*3/uL (ref 4.0–10.5)

## 2018-04-29 LAB — COMPREHENSIVE METABOLIC PANEL
ALT: 13 U/L (ref 0–44)
AST: 19 U/L (ref 15–41)
Albumin: 3.8 g/dL (ref 3.5–5.0)
Alkaline Phosphatase: 41 U/L (ref 38–126)
Anion gap: 9 (ref 5–15)
BUN: 14 mg/dL (ref 8–23)
CHLORIDE: 106 mmol/L (ref 98–111)
CO2: 26 mmol/L (ref 22–32)
Calcium: 10.3 mg/dL (ref 8.9–10.3)
Creatinine, Ser: 0.69 mg/dL (ref 0.44–1.00)
Glucose, Bld: 85 mg/dL (ref 70–99)
POTASSIUM: 4.3 mmol/L (ref 3.5–5.1)
Sodium: 141 mmol/L (ref 135–145)
Total Bilirubin: 0.3 mg/dL (ref 0.3–1.2)
Total Protein: 7.1 g/dL (ref 6.5–8.1)

## 2018-04-29 MED ORDER — FULVESTRANT 250 MG/5ML IM SOLN
INTRAMUSCULAR | Status: AC
  Filled 2018-04-29: qty 10

## 2018-04-29 MED ORDER — FULVESTRANT 250 MG/5ML IM SOLN
500.0000 mg | Freq: Once | INTRAMUSCULAR | Status: AC
  Administered 2018-04-29: 500 mg via INTRAMUSCULAR

## 2018-04-29 NOTE — Patient Instructions (Signed)

## 2018-04-30 ENCOUNTER — Ambulatory Visit
Admission: RE | Admit: 2018-04-30 | Discharge: 2018-04-30 | Disposition: A | Discharge status: Home / Self Care | Payer: Medicare Other | Source: Ambulatory Visit | Attending: General Surgery | Admitting: General Surgery

## 2018-04-30 DIAGNOSIS — Z170 Estrogen receptor positive status [ER+]: Principal | ICD-10-CM

## 2018-04-30 DIAGNOSIS — C50212 Malignant neoplasm of upper-inner quadrant of left female breast: Secondary | ICD-10-CM

## 2018-04-30 DIAGNOSIS — C50912 Malignant neoplasm of unspecified site of left female breast: Secondary | ICD-10-CM | POA: Diagnosis not present

## 2018-04-30 NOTE — H&P (Signed)
April Rosales Location: [facility] Patient #: [id number] DOB: Oct 05, 1940 Divorced / Language: Cleophus Molt / Race: White Female       History of Present Illness       This is a pleasant 77 year old woman. Pretty good performance status. She is referred by Dr. Jana Hakim for consideration of definitive surgery for left breast cancer. Dr. Melford Aase is her PCP. Dr. Rodell Perna is her spine surgeon.       She has a complex history and it took a long time to sort this out. She underwent right breast lumpectomy, axillary lymph node dissection in 1985 followed by radiation at [facility].      On August 29, 2014 she was found to have a 2.3 cm tumor in her left breast which was palpable. Image guided biopsy shows invasive ductal carcinoma and invasive lobular carcinoma mixed. ER strongly positive PR 0. HER-2 equivocal. She was found to have metastatic tumor in T3 involving the pedicle but without spinal compression. On Oct 20, 2014 she underwent T3, 2, and 4 decompressive laminectomy. Cells were positive. She had radiation therapy. Recent bone scan is negative showing only postoperative changes. She has no visceral disease. Her breast cancer on the left has never been operated on and she has been receiving a variety of adjuvant and palliative therapies. Most recently she has been on fulvestrant and pabociclib.      Genetic testing is negative for deleterious mutations.       A recent ultrasound suggested enlargement In one of her left breast lesions. In July 2019 she underwent image guided biopsy of 2 areas in her left breast, one in the upper inner quadrant slightly lateral and one in the upper inner quadrant slightly medial. These are actually fairly close to each other. 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 I have discussed the geography of her cancers. Her original invasive cancer seems to be more medial with clip artifact noted on MRI. The 2 areas of  DCIS are a little bit more lateral at about 3.5 cm away, but immediately adjacent to each other..      I told the patient that the goals of surgery would be to remove all 3 cancer areas with a negative margin. There is probably no benefit to left axillary node biopsy. The patient is strongly motivated for lumpectomy. I discussed the case with Dr. Jetta Lout. We think we can place one seed laterally and one medially. The tumors are arranged both medial to lateral and superior to inferior. Probably I will have to take out about 8 cm x 4 cm area of tissue with a generous curvilinear incision somewhat transversely in the upper inner quadrant. I explained this procedure to the patient. She knows there will be some volume loss. She is willing to accept this. Hopefully we do some tissue transfer to help her with this       Considering the fact that she is 69, has a clinically and radiographically negative axilla, and has stage IV disease, we decided to recommend no sentinel node biopsy. I sent a note to Dr. Jana Hakim to make sure he agrees I am not sure whether she will be offered radiation therapy or not and I told her that.      So she will be scheduled for left breast lumpectomy with bracketed radiographic seed localization.      I discussed the indications, details, technique, and numerous risk of the surgery and her family member. She is aware  of the risk of bleeding, infection, cosmetic deformity which may be significant, reoperation for positive margins, nerve damage with chronic pain. She may or may not be offered radiation therapy. She understands all of these issues. All of her questions are answered. She agrees with this plan.    Allergies  No Known Drug Allergies  Allergies Reconciled   Medication History  Gabapentin (100MG Capsule, Oral) Active. Femara (2.5MG Tablet, Oral) Active. Iron Supplement (325 (65 Fe)MG Tablet, Oral) Active. Medications Reconciled  Vitals   Weight: 124 lb Height: 62in Body Surface Area: 1.56 m Body Mass Index: 22.68 kg/m  Temp.: 98.20F(Oral)  Pulse: 78 (Regular)  BP: 130/82 (Sitting, Left Arm, Standard)     Physical Exam  General Mental Status-Alert. General Appearance-Consistent with stated age. Hydration-Well hydrated. Voice-Normal.  Integumentary Note: I did not examine her back.   Head and Neck Head-normocephalic, atraumatic with no lesions or palpable masses. Trachea-midline. Thyroid Gland Characteristics - normal size and consistency. Note: Collar incision from thyroid surgery.   Eye Eyeball - Bilateral-Extraocular movements intact. Sclera/Conjunctiva - Bilateral-No scleral icterus.  Chest and Lung Exam Chest and lung exam reveals -quiet, even and easy respiratory effort with no use of accessory muscles and on auscultation, normal breath sounds, no adventitious sounds and normal vocal resonance. Inspection Chest Wall - Normal. Back - normal.  Breast Note: Left breast reveals some vague lumpiness in the upper inner quadrant but no discrete mass. No skin changes. No axillary adenopathy. Right breast reveals lumpectomy scar upper-outer quadrant and a fairly extensive axillary scar extends out of the breast. I don't feel a mass in the right breast or axilla.   Cardiovascular Cardiovascular examination reveals -normal heart sounds, regular rate and rhythm with no murmurs and normal pedal pulses bilaterally.  Abdomen Inspection Inspection of the abdomen reveals - No Hernias. Skin - Scar - Note: Scars from appendectomy and abdominal hysterectomy healed. Palpation/Percussion Palpation and Percussion of the abdomen reveal - Soft, Non Tender, No Rebound tenderness, No Rigidity (guarding) and No hepatosplenomegaly. Auscultation Auscultation of the abdomen reveals - Bowel sounds normal.  Neurologic Neurologic evaluation reveals -alert and oriented x 3 with no  impairment of recent or remote memory. Mental Status-Normal.  Musculoskeletal Normal Exam - Left-Upper Extremity Strength Normal and Lower Extremity Strength Normal. Normal Exam - Right-Upper Extremity Strength Normal and Lower Extremity Strength Normal. Note: No lymphedema of upper extremities   Lymphatic Head & Neck  General Head & Neck Lymphatics: Bilateral - Description - Normal. Axillary  General Axillary Region: Bilateral - Description - Normal. Tenderness - Non Tender. Femoral & Inguinal  Generalized Femoral & Inguinal Lymphatics: Bilateral - Description - Normal. Tenderness - Non Tender.    Assessment & Plan  PRIMARY CANCER OF UPPER INNER QUADRANT OF LEFT FEMALE BREAST [phone])   We have discussed left breast surgical options for your multifocal left breast cancer This discussion includes the fact that you have stage IV disease with metastasis to your spine It appears that the spine disease is under good control now  You had an invasive cancer of the left breast diagnosed in 2016 but never had surgery More recent biopsies show to adjacent areas of ductal carcinoma in situ in the left breast All of these areas are in the upper inner quadrant of the left breast They are at least 3.5 cm apart I think we can perform a lumpectomy here as you desire, but we'll probably have to take out an 8 cm x 4 cm piece of tissue so  there will be some volume loss You have stated that this is acceptable and that you prefer not to have a mastectomy  Given the fact that there are no enlarged lymph nodes in your axilla by exam or by ultrasound or by MRI, and given the fact that she have stage IV disease at age 40, I do not think that any axillary lymph node biopsy will benefit you I will let Dr. Jana Hakim know of my opinion but I suspect he will agree  As discussed, you will be scheduled for left breast lumpectomy 2 with double bracketed radioactive seed localization We have  discussed the indications, techniques, and risk of this surgery in detail with you and your family members you will be able to go home the same day so long as an adult stays in the house with you for 24 hours  HISTORY OF RIGHT BREAST CANCER (Z85.3) CARCINOMA OF LEFT BREAST METASTATIC TO BONE (C50.912) HISTORY OF TOTAL ABDOMINAL HYSTERECTOMY AND BILATERAL SALPINGO-OOPHORECTOMY (Z90.710) HYPERLIPIDEMIA, ACQUIRED (E78.5) HYPERTENSION, BENIGN (I10) RIGHT THYROID NODULE (E04.1) HISTORY OF APPENDECTOMY (Z90.49)    Kees Idrovo M. Dalbert Batman, M.D., [facility] General and Minimally invasive Surgery Breast and Colorectal Surgery Office:   [phone] Pager:   [phone]

## 2018-04-30 NOTE — Progress Notes (Signed)
Ensure pre surgery drink given with instructions to complete by 0815 dos, surgical soap given with instructions, pt verbalized understanding. 

## 2018-05-03 ENCOUNTER — Ambulatory Visit (HOSPITAL_BASED_OUTPATIENT_CLINIC_OR_DEPARTMENT_OTHER)
Admission: RE | Admit: 2018-05-03 | Discharge: 2018-05-03 | Disposition: A | Discharge status: Home / Self Care | Payer: Medicare Other | Source: Ambulatory Visit | Attending: General Surgery | Admitting: General Surgery

## 2018-05-03 ENCOUNTER — Encounter (HOSPITAL_BASED_OUTPATIENT_CLINIC_OR_DEPARTMENT_OTHER)
Admission: RE | Disposition: A | Discharge status: Home / Self Care | Payer: Self-pay | Source: Ambulatory Visit | Attending: General Surgery

## 2018-05-03 ENCOUNTER — Ambulatory Visit
Admission: RE | Admit: 2018-05-03 | Discharge: 2018-05-03 | Disposition: A | Discharge status: Home / Self Care | Payer: Medicare Other | Source: Ambulatory Visit | Attending: General Surgery | Admitting: General Surgery

## 2018-05-03 ENCOUNTER — Ambulatory Visit (HOSPITAL_BASED_OUTPATIENT_CLINIC_OR_DEPARTMENT_OTHER): Payer: Medicare Other | Admitting: Certified Registered Nurse Anesthetist

## 2018-05-03 ENCOUNTER — Encounter (HOSPITAL_BASED_OUTPATIENT_CLINIC_OR_DEPARTMENT_OTHER): Payer: Self-pay | Admitting: Certified Registered Nurse Anesthetist

## 2018-05-03 DIAGNOSIS — Z87891 Personal history of nicotine dependence: Secondary | ICD-10-CM | POA: Diagnosis not present

## 2018-05-03 DIAGNOSIS — C50212 Malignant neoplasm of upper-inner quadrant of left female breast: Secondary | ICD-10-CM

## 2018-05-03 DIAGNOSIS — C50912 Malignant neoplasm of unspecified site of left female breast: Secondary | ICD-10-CM | POA: Diagnosis not present

## 2018-05-03 DIAGNOSIS — C7951 Secondary malignant neoplasm of bone: Secondary | ICD-10-CM | POA: Diagnosis not present

## 2018-05-03 DIAGNOSIS — I10 Essential (primary) hypertension: Secondary | ICD-10-CM | POA: Diagnosis not present

## 2018-05-03 DIAGNOSIS — Z79811 Long term (current) use of aromatase inhibitors: Secondary | ICD-10-CM | POA: Diagnosis not present

## 2018-05-03 DIAGNOSIS — Z853 Personal history of malignant neoplasm of breast: Secondary | ICD-10-CM | POA: Diagnosis not present

## 2018-05-03 DIAGNOSIS — G629 Polyneuropathy, unspecified: Secondary | ICD-10-CM | POA: Insufficient documentation

## 2018-05-03 DIAGNOSIS — C50412 Malignant neoplasm of upper-outer quadrant of left female breast: Secondary | ICD-10-CM

## 2018-05-03 DIAGNOSIS — Z170 Estrogen receptor positive status [ER+]: Principal | ICD-10-CM

## 2018-05-03 DIAGNOSIS — Z79899 Other long term (current) drug therapy: Secondary | ICD-10-CM | POA: Insufficient documentation

## 2018-05-03 DIAGNOSIS — R928 Other abnormal and inconclusive findings on diagnostic imaging of breast: Secondary | ICD-10-CM | POA: Diagnosis not present

## 2018-05-03 DIAGNOSIS — N6022 Fibroadenosis of left breast: Secondary | ICD-10-CM | POA: Diagnosis not present

## 2018-05-03 DIAGNOSIS — E785 Hyperlipidemia, unspecified: Secondary | ICD-10-CM | POA: Diagnosis not present

## 2018-05-03 DIAGNOSIS — N6012 Diffuse cystic mastopathy of left breast: Secondary | ICD-10-CM | POA: Diagnosis not present

## 2018-05-03 HISTORY — PX: BREAST LUMPECTOMY WITH RADIOACTIVE SEED LOCALIZATION: SHX6424

## 2018-05-03 SURGERY — BREAST LUMPECTOMY WITH RADIOACTIVE SEED LOCALIZATION
Anesthesia: General | Site: Breast | Laterality: Left

## 2018-05-03 MED ORDER — PROPOFOL 500 MG/50ML IV EMUL
INTRAVENOUS | Status: AC
  Filled 2018-05-03: qty 50

## 2018-05-03 MED ORDER — ONDANSETRON HCL 4 MG/2ML IJ SOLN
INTRAMUSCULAR | Status: DC | PRN
  Administered 2018-05-03: 4 mg via INTRAVENOUS

## 2018-05-03 MED ORDER — CEFAZOLIN SODIUM-DEXTROSE 2-4 GM/100ML-% IV SOLN
INTRAVENOUS | Status: AC
  Filled 2018-05-03: qty 100

## 2018-05-03 MED ORDER — GABAPENTIN 300 MG PO CAPS
ORAL_CAPSULE | ORAL | Status: AC
  Filled 2018-05-03: qty 1

## 2018-05-03 MED ORDER — GABAPENTIN 300 MG PO CAPS
300.0000 mg | ORAL_CAPSULE | ORAL | Status: AC
  Administered 2018-05-03: 300 mg via ORAL

## 2018-05-03 MED ORDER — ONDANSETRON HCL 4 MG/2ML IJ SOLN
INTRAMUSCULAR | Status: AC
  Filled 2018-05-03: qty 2

## 2018-05-03 MED ORDER — ACETAMINOPHEN 500 MG PO TABS
1000.0000 mg | ORAL_TABLET | ORAL | Status: AC
  Administered 2018-05-03: 1000 mg via ORAL

## 2018-05-03 MED ORDER — SCOPOLAMINE 1 MG/3DAYS TD PT72: 1.0000 | MEDICATED_PATCH | Freq: Once | TRANSDERMAL | Status: DC | PRN

## 2018-05-03 MED ORDER — CHLORHEXIDINE GLUCONATE CLOTH 2 % EX PADS: 6.0000 | MEDICATED_PAD | Freq: Once | CUTANEOUS | Status: DC

## 2018-05-03 MED ORDER — OXYCODONE HCL 5 MG/5ML PO SOLN: 5.0000 mg | Freq: Once | ORAL | Status: DC | PRN

## 2018-05-03 MED ORDER — FENTANYL CITRATE (PF) 100 MCG/2ML IJ SOLN
INTRAMUSCULAR | Status: AC
  Filled 2018-05-03: qty 2

## 2018-05-03 MED ORDER — TRAMADOL HCL 50 MG PO TABS: 50.0000 mg | ORAL_TABLET | Freq: Four times a day (QID) | ORAL | 0 refills | Status: DC | PRN

## 2018-05-03 MED ORDER — LACTATED RINGERS IV SOLN
INTRAVENOUS | Status: DC
  Administered 2018-05-03: 12:00:00 via INTRAVENOUS

## 2018-05-03 MED ORDER — FENTANYL CITRATE (PF) 100 MCG/2ML IJ SOLN: 25.0000 ug | INTRAMUSCULAR | Status: DC | PRN

## 2018-05-03 MED ORDER — MIDAZOLAM HCL 2 MG/2ML IJ SOLN: 1.0000 mg | INTRAMUSCULAR | Status: DC | PRN

## 2018-05-03 MED ORDER — FENTANYL CITRATE (PF) 100 MCG/2ML IJ SOLN
INTRAMUSCULAR | Status: DC | PRN
  Administered 2018-05-03 (×4): 25 ug via INTRAVENOUS

## 2018-05-03 MED ORDER — BUPIVACAINE-EPINEPHRINE (PF) 0.25% -1:200000 IJ SOLN
INTRAMUSCULAR | Status: DC | PRN
  Administered 2018-05-03: 10 mL

## 2018-05-03 MED ORDER — FENTANYL CITRATE (PF) 100 MCG/2ML IJ SOLN: 50.0000 ug | INTRAMUSCULAR | Status: DC | PRN

## 2018-05-03 MED ORDER — LIDOCAINE HCL (CARDIAC) PF 100 MG/5ML IV SOSY
PREFILLED_SYRINGE | INTRAVENOUS | Status: DC | PRN
  Administered 2018-05-03: 50 mg via INTRAVENOUS

## 2018-05-03 MED ORDER — DEXAMETHASONE SODIUM PHOSPHATE 10 MG/ML IJ SOLN
INTRAMUSCULAR | Status: AC
  Filled 2018-05-03: qty 1

## 2018-05-03 MED ORDER — CEFAZOLIN SODIUM-DEXTROSE 2-4 GM/100ML-% IV SOLN
2.0000 g | INTRAVENOUS | Status: AC
  Administered 2018-05-03: 2 g via INTRAVENOUS

## 2018-05-03 MED ORDER — OXYCODONE HCL 5 MG PO TABS: 5.0000 mg | ORAL_TABLET | Freq: Once | ORAL | Status: DC | PRN

## 2018-05-03 MED ORDER — ACETAMINOPHEN 500 MG PO TABS
ORAL_TABLET | ORAL | Status: AC
  Filled 2018-05-03: qty 2

## 2018-05-03 MED ORDER — PROPOFOL 10 MG/ML IV BOLUS
INTRAVENOUS | Status: DC | PRN
  Administered 2018-05-03: 120 mg via INTRAVENOUS

## 2018-05-03 MED ORDER — DEXAMETHASONE SODIUM PHOSPHATE 10 MG/ML IJ SOLN
INTRAMUSCULAR | Status: DC | PRN
  Administered 2018-05-03: 8 mg via INTRAVENOUS

## 2018-05-03 MED ORDER — ONDANSETRON HCL 4 MG/2ML IJ SOLN: 4.0000 mg | Freq: Four times a day (QID) | INTRAMUSCULAR | Status: DC | PRN

## 2018-05-03 MED ORDER — LIDOCAINE 2% (20 MG/ML) 5 ML SYRINGE
INTRAMUSCULAR | Status: AC
  Filled 2018-05-03: qty 5

## 2018-05-03 SURGICAL SUPPLY — 50 items
ADH SKN CLS APL DERMABOND .7 (GAUZE/BANDAGES/DRESSINGS) ×1
APPLIER CLIP 9.375 MED OPEN (MISCELLANEOUS) ×2
APR CLP MED 9.3 20 MLT OPN (MISCELLANEOUS) ×1
BINDER BREAST LRG (GAUZE/BANDAGES/DRESSINGS) ×1 IMPLANT
BINDER BREAST XLRG (GAUZE/BANDAGES/DRESSINGS) IMPLANT
BLADE HEX COATED 2.75 (ELECTRODE) ×2 IMPLANT
BLADE SURG 15 STRL LF DISP TIS (BLADE) ×1 IMPLANT
BLADE SURG 15 STRL SS (BLADE) ×2
CANISTER SUCT 1200ML W/VALVE (MISCELLANEOUS) ×2 IMPLANT
CHLORAPREP W/TINT 26ML (MISCELLANEOUS) ×2 IMPLANT
CLIP APPLIE 9.375 MED OPEN (MISCELLANEOUS) IMPLANT
COVER BACK TABLE 60X90IN (DRAPES) ×2 IMPLANT
COVER MAYO STAND STRL (DRAPES) ×2 IMPLANT
COVER PROBE W GEL 5X96 (DRAPES) ×2 IMPLANT
DECANTER SPIKE VIAL GLASS SM (MISCELLANEOUS) ×1 IMPLANT
DERMABOND ADVANCED (GAUZE/BANDAGES/DRESSINGS) ×1
DERMABOND ADVANCED .7 DNX12 (GAUZE/BANDAGES/DRESSINGS) ×1 IMPLANT
DEVICE DUBIN W/COMP PLATE 8390 (MISCELLANEOUS) ×2 IMPLANT
DRAPE LAPAROSCOPIC ABDOMINAL (DRAPES) ×2 IMPLANT
DRAPE UTILITY XL STRL (DRAPES) ×2 IMPLANT
DRSG PAD ABDOMINAL 8X10 ST (GAUZE/BANDAGES/DRESSINGS) ×2 IMPLANT
ELECT REM PT RETURN 9FT ADLT (ELECTROSURGICAL) ×2
ELECTRODE REM PT RTRN 9FT ADLT (ELECTROSURGICAL) ×1 IMPLANT
GAUZE SPONGE 4X4 12PLY STRL LF (GAUZE/BANDAGES/DRESSINGS) ×2 IMPLANT
GLOVE EUDERMIC 7 POWDERFREE (GLOVE) ×2 IMPLANT
GOWN STRL REUS W/ TWL LRG LVL3 (GOWN DISPOSABLE) ×1 IMPLANT
GOWN STRL REUS W/ TWL XL LVL3 (GOWN DISPOSABLE) ×1 IMPLANT
GOWN STRL REUS W/TWL LRG LVL3 (GOWN DISPOSABLE) ×2
GOWN STRL REUS W/TWL XL LVL3 (GOWN DISPOSABLE) ×2
KIT MARKER MARGIN INK (KITS) ×2 IMPLANT
NDL HYPO 25X1 1.5 SAFETY (NEEDLE) ×1 IMPLANT
NEEDLE HYPO 25X1 1.5 SAFETY (NEEDLE) ×2 IMPLANT
NS IRRIG 1000ML POUR BTL (IV SOLUTION) ×2 IMPLANT
PACK BASIN DAY SURGERY FS (CUSTOM PROCEDURE TRAY) ×2 IMPLANT
PENCIL BUTTON HOLSTER BLD 10FT (ELECTRODE) ×2 IMPLANT
SHEET MEDIUM DRAPE 40X70 STRL (DRAPES) ×1 IMPLANT
SLEEVE SCD COMPRESS KNEE MED (MISCELLANEOUS) ×2 IMPLANT
SPONGE LAP 4X18 RFD (DISPOSABLE) ×2 IMPLANT
SUT MNCRL AB 4-0 PS2 18 (SUTURE) ×2 IMPLANT
SUT SILK 2 0 SH (SUTURE) ×2 IMPLANT
SUT VIC AB 2-0 CT1 27 (SUTURE) ×4
SUT VIC AB 2-0 CT1 TAPERPNT 27 (SUTURE) IMPLANT
SUT VIC AB 3-0 SH 27 (SUTURE)
SUT VIC AB 3-0 SH 27X BRD (SUTURE) IMPLANT
SUT VICRYL 3-0 CR8 SH (SUTURE) ×2 IMPLANT
SYR 10ML LL (SYRINGE) ×2 IMPLANT
TOWEL GREEN STERILE FF (TOWEL DISPOSABLE) ×2 IMPLANT
TRAY FAXITRON CT DISP (TRAY / TRAY PROCEDURE) ×1 IMPLANT
TUBE CONNECTING 20X1/4 (TUBING) ×2 IMPLANT
YANKAUER SUCT BULB TIP NO VENT (SUCTIONS) ×2 IMPLANT

## 2018-05-03 NOTE — Anesthesia Procedure Notes (Signed)
Procedure Name: LMA Insertion Date/Time: 05/03/2018 12:08 PM Performed by: Raenette Rover, CRNA Pre-anesthesia Checklist: Patient identified, Emergency Drugs available, Suction available and Patient being monitored Patient Re-evaluated:Patient Re-evaluated prior to induction Oxygen Delivery Method: Circle system utilized Preoxygenation: Pre-oxygenation with 100% oxygen Induction Type: IV induction LMA: LMA inserted LMA Size: 4.0 Number of attempts: 1 Placement Confirmation: positive ETCO2,  CO2 detector and breath sounds checked- equal and bilateral Tube secured with: Tape Dental Injury: Teeth and Oropharynx as per pre-operative assessment

## 2018-05-03 NOTE — Op Note (Signed)
Patient Name:           April Rosales   Date of Surgery:        05/03/2018  Pre op Diagnosis:      Multifocal cancer left breast  Post op Diagnosis:    Same  Procedure:                 Left breast lumpectomy with bracketed radio active seed localization                                      Adjacent tissue transfer 10 cm transverse by 2 cm vertical by 2.5 cm deep.                                       Reexcision medial margin  Surgeon:                     Edsel Petrin. Dalbert Batman, M.D., FACS  Assistant:                      OR staff  Operative Indications:   This is a pleasant 77 year old woman. Pretty good performance status. She is referred by Dr. Jana Hakim for consideration of definitive surgery for left breast cancer. Dr. Melford Aase is her PCP. Dr. Rodell Perna is her spine surgeon.       She has a complex history. . She underwent right breast lumpectomy, axillary lymph node dissection in 1985 followed by radiation at [facility].      On August 29, 2014 she was found to have a 2.3 cm tumor in her left breast which was palpable. Image guided biopsy shows invasive ductal carcinoma and invasive lobular carcinoma mixed. ER strongly positive PR 0. HER-2 equivocal. She was found to have metastatic tumor in T3 involving the pedicle but without spinal compression. On Oct 20, 2014 she underwent T3, 2, and 4 decompressive laminectomy. Cells were positive. She had radiation therapy. Recent bone scan is negative showing only postoperative changes. She has no visceral disease. Her breast cancer on the left has never been operated on and she has been receiving a variety of adjuvant and palliative therapies. Most recently she has been on fulvestrant and pabociclib.      Genetic testing is negative for deleterious mutations.       A recent ultrasound suggested enlargement In one of her left breast lesions. In July 2019 she underwent image guided biopsy of 2 areas in her left breast, one in the  upper inner quadrant slightly lateral and one in the upper inner quadrant slightly medial. These are actually fairly close to each other. 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 I have discussed the geography of her cancers. Her original invasive cancer seems to be more medial with clip artifact noted on MRI. The 2 areas of DCIS are a little bit more lateral at about 3.5 cm away, but immediately adjacent to each other..      I told the patient that the goals of surgery would be to remove all 3 cancer areas with a negative margin. There is probably no benefit to left axillary node biopsy. The patient is strongly motivated for lumpectomy. I discussed the case with Dr. Jetta Lout. We think we can place one seed laterally and  one medially. The tumors are arranged both medial to lateral and superior to inferior. Probably I will have to take out about 8 cm x 4 cm area of tissue with a generous curvilinear incision somewhat transversely in the upper inner quadrant. I explained this procedure to the patient. She knows there will be some volume loss. She is willing to accept this. Hopefully we do some tissue transfer to help her with this       Considering the fact that she is 51, has a clinically and radiographically negative axilla, and has stage IV disease, we decided to recommend no sentinel node biopsy. I sent a note to Dr. Jana Hakim to make sure he agrees I am not sure whether she will be offered radiation therapy or not and I told her that.      So she will be scheduled for left breast lumpectomy with bracketed radiographic seed localization.     Marland Kitchen  Operative Findings:       We were able to remove all 3 cancers, all 3 original biopsy clips, and both radioactive seeds through a somewhat transverse curvilinear incision.  The incision was 10 cm long by 2 cm wide by 2.5 cm deep.  That was also the size of the tissue transfer.  We took multiple images with the new Faxitron.  It  appeared that we had both radioactive seeds, all 3 biopsy marker clips and that we had an adequate margin everywhere.  I undermined the skin superiorly and inferiorly during the lumpectomy so the broad anterior margin is the skin.  The posterior margin is essentially the muscle.  Procedure in Detail:          Following the induction of general LMA anesthesia the patient's left breast was prepped and draped in a sterile fashion.  Surgical timeout was performed.  Intravenous antibiotics were given.  0.25% Marcaine with epinephrine was used as a local infiltration anesthetic.  Using the neoprobe I mapped out the 2 radioactive seeds.  I then felt that the more medial cancer was fairly superficial such as to make an elliptical incision, somewhat curvilinear from medial to lateral.  Dimensions are described above.  The lumpectomy was performed using electrocautery and the neoprobe frequently.  The specimen was removed and marked with silk sutures and a 6 color ink kit to orient the specimen.  Three-dimensional specimen mammograms were obtained and these look very good and the seeds in the cancers appear to be contained within the breast tissue.  Hopefully will we will have a negative margin.  Specimen was sent to the pathology lab where the seeds were retrieved.      The lateral margin appeared to be in the axilla but some of the tissue looked a little bit fibrotic and hard and so I reexcised the lateral margin, marked that with ink and sent that as a separate specimen.    Hemostasis was excellent.  The wound was irrigated.  5 metal marker clips were placed in the walls of the lumpectomy cavity.  I undermined the breast tissue off of the pectoralis major muscle medially and laterally. Using 2-0 Vicryl sutures I slid the posterior breast tissue together and this built things up fairly reasonably.  Superficial breast tissue was closed with 3-0 Vicryl and the skin closed with a running subcuticular 4-0 Monocryl and  Dermabond.  Clean bandages and a breast binder were placed.  The patient tolerated the procedure well was taken to PACU in stable condition.  EBL 20  cc.  Counts correct.  Complications none.   Addendum: I logged onto the [employer] and reviewed her prescription medication history     Tajay Muzzy M. Dalbert Batman, M.D., FACS General and Minimally Invasive Surgery Breast and Colorectal Surgery  05/03/2018 1:24 PM

## 2018-05-03 NOTE — Transfer of Care (Signed)
Immediate Anesthesia Transfer of Care Note  Patient: April Rosales  Procedure(s) Performed: LEFT BREAST LUMPECTOMY WITH BRACKETED RADIOACTIVE SEED LOCALIZATION (Left Breast)  Patient Location: PACU  Anesthesia Type:General  Level of Consciousness: drowsy  Airway & Oxygen Therapy: Patient Spontanous Breathing and Patient connected to face mask oxygen  Post-op Assessment: Report given to RN and Post -op Vital signs reviewed and stable  Post vital signs: Reviewed and stable  Last Vitals:  Vitals Value Taken Time  BP 110/65 05/03/2018  1:18 PM  Temp    Pulse 73 05/03/2018  1:19 PM  Resp 11 05/03/2018  1:19 PM  SpO2 99 % 05/03/2018  1:19 PM  Vitals shown include unvalidated device data.  Last Pain:  Vitals:   05/03/18 1101  TempSrc: Oral  PainSc: 0-No pain         Complications: No apparent anesthesia complications

## 2018-05-03 NOTE — Discharge Instructions (Signed)
Next dose of Tylenol: 5:15 PM   [place] Office Phone Number [phone]  BREAST BIOPSY/ PARTIAL MASTECTOMY: POST OP INSTRUCTIONS  Always review your discharge instruction sheet given to you by the facility where your surgery was performed.  IF YOU HAVE DISABILITY OR FAMILY LEAVE FORMS, YOU MUST BRING THEM TO THE OFFICE FOR PROCESSING.  DO NOT GIVE THEM TO YOUR DOCTOR.  1. A prescription for pain medication may be given to you upon discharge.  Take your pain medication as prescribed, if needed.  If narcotic pain medicine is not needed, then you may take acetaminophen (Tylenol) or ibuprofen (Advil) as needed. 2. Take your usually prescribed medications unless otherwise directed 3. If you need a refill on your pain medication, please contact your pharmacy.  They will contact our office to request authorization.  Prescriptions will not be filled after 5pm or on week-ends. 4. You should eat very light the first 24 hours after surgery, such as soup, crackers, pudding, etc.  Resume your normal diet the day after surgery. 5. Most patients will experience some swelling and bruising in the breast.  Ice packs and a good support bra will help.  Swelling and bruising can take several days to resolve.  6. It is common to experience some constipation if taking pain medication after surgery.  Increasing fluid intake and taking a stool softener will usually help or prevent this problem from occurring.  A mild laxative (Milk of Magnesia or Miralax) should be taken according to package directions if there are no bowel movements after 48 hours. 7. Unless discharge instructions indicate otherwise, you may remove your bandages 24-48 hours after surgery, and you may shower at that time.  You may have steri-strips (small skin tapes) in place directly over the incision.  These strips should be left on the skin for 7-10 days.  If your surgeon used skin glue on the incision, you may shower in 24 hours.   The glue will flake off over the next 2-3 weeks.  Any sutures or staples will be removed at the office during your follow-up visit. 8. ACTIVITIES:  You may resume regular daily activities (gradually increasing) beginning the next day.  Wearing a good support bra or sports bra minimizes pain and swelling.  You may have sexual intercourse when it is comfortable. a. You may drive when you no longer are taking prescription pain medication, you can comfortably wear a seatbelt, and you can safely maneuver your car and apply brakes. b. RETURN TO WORK:  ______________________________________________________________________________________ 9. You should see your doctor in the office for a follow-up appointment approximately two weeks after your surgery.  Your doctors nurse will typically make your follow-up appointment when she calls you with your pathology report.  Expect your pathology report 2-3 business days after your surgery.  You may call to check if you do not hear from [place] after three days. 10. OTHER INSTRUCTIONS: _______________________________________________________________________________________________ _____________________________________________________________________________________________________________________________________ _____________________________________________________________________________________________________________________________________ _____________________________________________________________________________________________________________________________________  WHEN TO CALL YOUR DOCTOR: 1. Fever over 101.0 2. Nausea and/or vomiting. 3. Extreme swelling or bruising. 4. Continued bleeding from incision. 5. Increased pain, redness, or drainage from the incision.  The clinic staff is available to answer your questions during regular business hours.  Please dont hesitate to call and ask to speak to one of the nurses for clinical concerns.  If you have a medical  emergency, go to the nearest emergency room or call 911.  A surgeon from [facility] is always on call at the hospital.  For further questions, please visit [email or web address]     Post Anesthesia Home Care Instructions  Activity: Get plenty of rest for the remainder of the day. A responsible individual must stay with you for 24 hours following the procedure.  For the next 24 hours, DO NOT: -Drive a car -Paediatric nurse -Drink alcoholic beverages -Take any medication unless instructed by your physician -Make any legal decisions or sign important papers.  Meals: Start with liquid foods such as gelatin or soup. Progress to regular foods as tolerated. Avoid greasy, spicy, heavy foods. If nausea and/or vomiting occur, drink only clear liquids until the nausea and/or vomiting subsides. Call your physician if vomiting continues.  Special Instructions/Symptoms: Your throat may feel dry or sore from the anesthesia or the breathing tube placed in your throat during surgery. If this causes discomfort, gargle with warm salt water. The discomfort should disappear within 24 hours.  If you had a scopolamine patch placed behind your ear for the management of post- operative nausea and/or vomiting:  1. The medication in the patch is effective for 72 hours, after which it should be removed.  Wrap patch in a tissue and discard in the trash. Wash hands thoroughly with soap and water. 2. You may remove the patch earlier than 72 hours if you experience unpleasant side effects which may include dry mouth, dizziness or visual disturbances. 3. Avoid touching the patch. Wash your hands with soap and water after contact with the patch.

## 2018-05-03 NOTE — Anesthesia Preprocedure Evaluation (Signed)
Anesthesia Evaluation  Patient identified by MRN, date of birth, ID band Patient awake    Reviewed: Allergy & Precautions, H&P , NPO status , Patient's Chart, lab work & pertinent test results  Airway Mallampati: II   Neck ROM: full    Dental   Pulmonary former smoker,    breath sounds clear to auscultation       Cardiovascular hypertension,  Rhythm:regular Rate:Normal     Neuro/Psych    GI/Hepatic   Endo/Other    Renal/GU      Musculoskeletal  (+) Arthritis ,   Abdominal   Peds  Hematology   Anesthesia Other Findings   Reproductive/Obstetrics Breast CA                             Anesthesia Physical Anesthesia Plan  ASA: II  Anesthesia Plan: General   Post-op Pain Management:    Induction: Intravenous  PONV Risk Score and Plan: 3 and Ondansetron, Dexamethasone and Treatment may vary due to age or medical condition  Airway Management Planned: LMA  Additional Equipment:   Intra-op Plan:   Post-operative Plan:   Informed Consent: I have reviewed the patients History and Physical, chart, labs and discussed the procedure including the risks, benefits and alternatives for the proposed anesthesia with the patient or authorized representative who has indicated his/her understanding and acceptance.     Plan Discussed with: CRNA, Anesthesiologist and Surgeon  Anesthesia Plan Comments:         Anesthesia Quick Evaluation

## 2018-05-03 NOTE — Interval H&P Note (Signed)
History and Physical Interval Note:  05/03/2018 11:40 AM  April Rosales  has presented today for surgery, with the diagnosis of left breast cancer  The various methods of treatment have been discussed with the patient and family. After consideration of risks, benefits and other options for treatment, the patient has consented to  Procedure(s): LEFT BREAST LUMPECTOMY WITH BRACKETED RADIOACTIVE SEED LOCALIZATION (Left) as a surgical intervention .  The patient's history has been reviewed, patient examined, no change in status, stable for surgery.  I have reviewed the patient's chart and labs.  Questions were answered to the patient's satisfaction.     April Rosales

## 2018-05-04 ENCOUNTER — Encounter (HOSPITAL_BASED_OUTPATIENT_CLINIC_OR_DEPARTMENT_OTHER): Payer: Self-pay | Admitting: General Surgery

## 2018-05-04 DIAGNOSIS — M545 Low back pain: Secondary | ICD-10-CM | POA: Diagnosis not present

## 2018-05-04 DIAGNOSIS — Z170 Estrogen receptor positive status [ER+]: Secondary | ICD-10-CM | POA: Diagnosis not present

## 2018-05-04 DIAGNOSIS — C50212 Malignant neoplasm of upper-inner quadrant of left female breast: Secondary | ICD-10-CM | POA: Diagnosis not present

## 2018-05-04 DIAGNOSIS — Z483 Aftercare following surgery for neoplasm: Secondary | ICD-10-CM | POA: Diagnosis not present

## 2018-05-04 DIAGNOSIS — M1990 Unspecified osteoarthritis, unspecified site: Secondary | ICD-10-CM | POA: Diagnosis not present

## 2018-05-04 DIAGNOSIS — C7951 Secondary malignant neoplasm of bone: Secondary | ICD-10-CM | POA: Diagnosis not present

## 2018-05-04 NOTE — Anesthesia Postprocedure Evaluation (Signed)
Anesthesia Post Note  Patient: April Rosales  Procedure(s) Performed: LEFT BREAST LUMPECTOMY WITH BRACKETED RADIOACTIVE SEED LOCALIZATION (Left Breast)     Patient location during evaluation: PACU Anesthesia Type: General Level of consciousness: awake and alert Pain management: pain level controlled Vital Signs Assessment: post-procedure vital signs reviewed and stable Respiratory status: spontaneous breathing, nonlabored ventilation, respiratory function stable and patient connected to nasal cannula oxygen Cardiovascular status: blood pressure returned to baseline and stable Postop Assessment: no apparent nausea or vomiting Anesthetic complications: no    Last Vitals:  Vitals:   05/03/18 1400 05/03/18 1410  BP:  (!) 146/90  Pulse: 91 95  Resp: 14 16  Temp:  37 C  SpO2: 95% 96%    Last Pain:  Vitals:   05/03/18 1410  TempSrc:   PainSc: [place]

## 2018-05-07 DIAGNOSIS — Z483 Aftercare following surgery for neoplasm: Secondary | ICD-10-CM | POA: Diagnosis not present

## 2018-05-07 DIAGNOSIS — Z170 Estrogen receptor positive status [ER+]: Secondary | ICD-10-CM | POA: Diagnosis not present

## 2018-05-07 DIAGNOSIS — M1990 Unspecified osteoarthritis, unspecified site: Secondary | ICD-10-CM | POA: Diagnosis not present

## 2018-05-07 DIAGNOSIS — M545 Low back pain: Secondary | ICD-10-CM | POA: Diagnosis not present

## 2018-05-07 DIAGNOSIS — C7951 Secondary malignant neoplasm of bone: Secondary | ICD-10-CM | POA: Diagnosis not present

## 2018-05-07 DIAGNOSIS — C50212 Malignant neoplasm of upper-inner quadrant of left female breast: Secondary | ICD-10-CM | POA: Diagnosis not present

## 2018-05-11 DIAGNOSIS — Z483 Aftercare following surgery for neoplasm: Secondary | ICD-10-CM | POA: Diagnosis not present

## 2018-05-11 DIAGNOSIS — C50212 Malignant neoplasm of upper-inner quadrant of left female breast: Secondary | ICD-10-CM | POA: Diagnosis not present

## 2018-05-11 DIAGNOSIS — M545 Low back pain: Secondary | ICD-10-CM | POA: Diagnosis not present

## 2018-05-11 DIAGNOSIS — C7951 Secondary malignant neoplasm of bone: Secondary | ICD-10-CM | POA: Diagnosis not present

## 2018-05-11 DIAGNOSIS — M1990 Unspecified osteoarthritis, unspecified site: Secondary | ICD-10-CM | POA: Diagnosis not present

## 2018-05-11 DIAGNOSIS — Z170 Estrogen receptor positive status [ER+]: Secondary | ICD-10-CM | POA: Diagnosis not present

## 2018-05-14 DIAGNOSIS — C7951 Secondary malignant neoplasm of bone: Secondary | ICD-10-CM | POA: Diagnosis not present

## 2018-05-14 DIAGNOSIS — C50212 Malignant neoplasm of upper-inner quadrant of left female breast: Secondary | ICD-10-CM | POA: Diagnosis not present

## 2018-05-14 DIAGNOSIS — M1990 Unspecified osteoarthritis, unspecified site: Secondary | ICD-10-CM | POA: Diagnosis not present

## 2018-05-14 DIAGNOSIS — M545 Low back pain: Secondary | ICD-10-CM | POA: Diagnosis not present

## 2018-05-14 DIAGNOSIS — Z483 Aftercare following surgery for neoplasm: Secondary | ICD-10-CM | POA: Diagnosis not present

## 2018-05-14 DIAGNOSIS — Z170 Estrogen receptor positive status [ER+]: Secondary | ICD-10-CM | POA: Diagnosis not present

## 2018-05-17 DIAGNOSIS — Z483 Aftercare following surgery for neoplasm: Secondary | ICD-10-CM | POA: Diagnosis not present

## 2018-05-17 DIAGNOSIS — M1990 Unspecified osteoarthritis, unspecified site: Secondary | ICD-10-CM | POA: Diagnosis not present

## 2018-05-17 DIAGNOSIS — M545 Low back pain: Secondary | ICD-10-CM | POA: Diagnosis not present

## 2018-05-17 DIAGNOSIS — C7951 Secondary malignant neoplasm of bone: Secondary | ICD-10-CM | POA: Diagnosis not present

## 2018-05-17 DIAGNOSIS — Z170 Estrogen receptor positive status [ER+]: Secondary | ICD-10-CM | POA: Diagnosis not present

## 2018-05-17 DIAGNOSIS — C50212 Malignant neoplasm of upper-inner quadrant of left female breast: Secondary | ICD-10-CM | POA: Diagnosis not present

## 2018-05-27 ENCOUNTER — Inpatient Hospital Stay: Payer: Medicare Other | Attending: Oncology

## 2018-05-27 ENCOUNTER — Inpatient Hospital Stay: Payer: Medicare Other

## 2018-05-27 VITALS — BP 142/83 | HR 79 | Temp 97.7°F | Resp 18

## 2018-05-27 DIAGNOSIS — Z5111 Encounter for antineoplastic chemotherapy: Secondary | ICD-10-CM | POA: Diagnosis not present

## 2018-05-27 DIAGNOSIS — C7951 Secondary malignant neoplasm of bone: Secondary | ICD-10-CM | POA: Insufficient documentation

## 2018-05-27 DIAGNOSIS — C50412 Malignant neoplasm of upper-outer quadrant of left female breast: Secondary | ICD-10-CM

## 2018-05-27 DIAGNOSIS — D0512 Intraductal carcinoma in situ of left breast: Secondary | ICD-10-CM | POA: Insufficient documentation

## 2018-05-27 DIAGNOSIS — C50912 Malignant neoplasm of unspecified site of left female breast: Secondary | ICD-10-CM

## 2018-05-27 DIAGNOSIS — C50919 Malignant neoplasm of unspecified site of unspecified female breast: Secondary | ICD-10-CM

## 2018-05-27 DIAGNOSIS — M810 Age-related osteoporosis without current pathological fracture: Secondary | ICD-10-CM

## 2018-05-27 LAB — COMPREHENSIVE METABOLIC PANEL
ALT: 14 U/L (ref 0–44)
AST: 18 U/L (ref 15–41)
Albumin: 3.5 g/dL (ref 3.5–5.0)
Alkaline Phosphatase: 41 U/L (ref 38–126)
Anion gap: 6 (ref 5–15)
BUN: 16 mg/dL (ref 8–23)
CO2: 29 mmol/L (ref 22–32)
Calcium: 10.5 mg/dL — ABNORMAL HIGH (ref 8.9–10.3)
Chloride: 105 mmol/L (ref 98–111)
Creatinine, Ser: 0.69 mg/dL (ref 0.44–1.00)
GFR calc Af Amer: >60 mL/min (ref >60)
GFR calc non Af Amer: >60 mL/min (ref >60)
GLUCOSE: 78 mg/dL (ref 70–99)
Potassium: 4.3 mmol/L (ref 3.5–5.1)
SODIUM: 140 mmol/L (ref 135–145)
Total Bilirubin: 0.3 mg/dL (ref 0.3–1.2)
Total Protein: 6.7 g/dL (ref 6.5–8.1)

## 2018-05-27 LAB — CBC WITH DIFFERENTIAL/PLATELET
Abs Immature Granulocytes: 0.01 10*3/uL (ref 0.00–0.07)
Basophils Absolute: 0.1 10*3/uL (ref 0.0–0.1)
Basophils Relative: 1 %
Eosinophils Absolute: 0.4 10*3/uL (ref 0.0–0.5)
Eosinophils Relative: 8 %
HCT: 41.1 % (ref 36.0–46.0)
Hemoglobin: 13.7 g/dL (ref 12.0–15.0)
Immature Granulocytes: 0 %
Lymphocytes Relative: 20 %
Lymphs Abs: 1 10*3/uL (ref 0.7–4.0)
MCH: 30.7 pg (ref 26.0–34.0)
MCHC: 33.3 g/dL (ref 30.0–36.0)
MCV: 92.2 fL (ref 80.0–100.0)
Monocytes Absolute: 0.5 10*3/uL (ref 0.1–1.0)
Monocytes Relative: 10 %
NEUTROS PCT: 61 %
Neutro Abs: 2.9 10*3/uL (ref 1.7–7.7)
Platelets: 248 10*3/uL (ref 150–400)
RBC: 4.46 MIL/uL (ref 3.87–5.11)
RDW: 14.1 % (ref 11.5–15.5)
WBC: 4.8 10*3/uL (ref 4.0–10.5)
nRBC: 0 % (ref 0.0–0.2)

## 2018-05-27 MED ORDER — FULVESTRANT 250 MG/5ML IM SOLN
500.0000 mg | Freq: Once | INTRAMUSCULAR | Status: AC
  Administered 2018-05-27: 500 mg via INTRAMUSCULAR

## 2018-05-27 MED ORDER — DENOSUMAB 120 MG/1.7ML ~~LOC~~ SOLN
SUBCUTANEOUS | Status: AC
  Filled 2018-05-27: qty 1.7

## 2018-05-27 MED ORDER — DENOSUMAB 120 MG/1.7ML ~~LOC~~ SOLN
120.0000 mg | Freq: Once | SUBCUTANEOUS | Status: AC
  Administered 2018-05-27: 120 mg via SUBCUTANEOUS

## 2018-05-27 MED ORDER — FULVESTRANT 250 MG/5ML IM SOLN
INTRAMUSCULAR | Status: AC
  Filled 2018-05-27: qty 5

## 2018-05-27 NOTE — Patient Instructions (Signed)
Fulvestrant injection What is this medicine? FULVESTRANT (ful VES trant) blocks the effects of estrogen. It is used to treat breast cancer. This medicine may be used for other purposes; ask your health care provider or pharmacist if you have questions. COMMON BRAND NAME(S): FASLODEX What should I tell my health care provider before I take this medicine? They need to know if you have any of these conditions: -bleeding disorders -liver disease -low blood counts, like low white cell, platelet, or red cell counts -an unusual or allergic reaction to fulvestrant, other medicines, foods, dyes, or preservatives -pregnant or trying to get pregnant -breast-feeding How should I use this medicine? This medicine is for injection into a muscle. It is usually given by a health care professional in a hospital or clinic setting. Talk to your pediatrician regarding the use of this medicine in children. Special care may be needed. Overdosage: If you think you have taken too much of this medicine contact a poison control center or emergency room at once. NOTE: This medicine is only for you. Do not share this medicine with others. What if I miss a dose? It is important not to miss your dose. Call your doctor or health care professional if you are unable to keep an appointment. What may interact with this medicine? -medicines that treat or prevent blood clots like warfarin, enoxaparin, dalteparin, apixaban, dabigatran, and rivaroxaban This list may not describe all possible interactions. Give your health care provider a list of all the medicines, herbs, non-prescription drugs, or dietary supplements you use. Also tell them if you smoke, drink alcohol, or use illegal drugs. Some items may interact with your medicine. What should I watch for while using this medicine? Your condition will be monitored carefully while you are receiving this medicine. You will need important blood work done while you are taking this  medicine. Do not become pregnant while taking this medicine or for at least 1 year after stopping it. Women of child-bearing potential will need to have a negative pregnancy test before starting this medicine. Women should inform their doctor if they wish to become pregnant or think they might be pregnant. There is a potential for serious side effects to an unborn child. Men should inform their doctors if they wish to father a child. This medicine may lower sperm counts. Talk to your health care professional or pharmacist for more information. Do not breast-feed an infant while taking this medicine or for 1 year after the last dose. What side effects may I notice from receiving this medicine? Side effects that you should report to your doctor or health care professional as soon as possible: -allergic reactions like skin rash, itching or hives, swelling of the face, lips, or tongue -feeling faint or lightheaded, falls -pain, tingling, numbness, or weakness in the legs -signs and symptoms of infection like fever or chills; cough; flu-like symptoms; sore throat -vaginal bleeding Side effects that usually do not require medical attention (report to your doctor or health care professional if they continue or are bothersome): -aches, pains -constipation -diarrhea -headache -hot flashes -nausea, vomiting -pain at site where injected -stomach pain This list may not describe all possible side effects. Call your doctor for medical advice about side effects. You may report side effects to FDA at 1-800-FDA-1088. Where should I keep my medicine? This drug is given in a hospital or clinic and will not be stored at home. NOTE: This sheet is a summary. It may not cover all possible information. If you  have questions about this medicine, talk to your doctor, pharmacist, or health care provider.  2019 Elsevier/Gold Standard (2017-08-20 11:34:41) Denosumab injection What is this medicine? DENOSUMAB (den oh  sue mab) slows bone breakdown. Prolia is used to treat osteoporosis in women after menopause and in men, and in people who are taking corticosteroids for 6 months or more. Delton See is used to treat a high calcium level due to cancer and to prevent bone fractures and other bone problems caused by multiple myeloma or cancer bone metastases. Delton See is also used to treat giant cell tumor of the bone. This medicine may be used for other purposes; ask your health care provider or pharmacist if you have questions. COMMON BRAND NAME(S): Prolia, XGEVA What should I tell my health care provider before I take this medicine? They need to know if you have any of these conditions: -dental disease -having surgery or tooth extraction -infection -kidney disease -low levels of calcium or Vitamin D in the blood -malnutrition -on hemodialysis -skin conditions or sensitivity -thyroid or parathyroid disease -an unusual reaction to denosumab, other medicines, foods, dyes, or preservatives -pregnant or trying to get pregnant -breast-feeding How should I use this medicine? This medicine is for injection under the skin. It is given by a health care professional in a hospital or clinic setting. A special MedGuide will be given to you before each treatment. Be sure to read this information carefully each time. For Prolia, talk to your pediatrician regarding the use of this medicine in children. Special care may be needed. For Delton See, talk to your pediatrician regarding the use of this medicine in children. While this drug may be prescribed for children as young as 13 years for selected conditions, precautions do apply. Overdosage: If you think you have taken too much of this medicine contact a poison control center or emergency room at once. NOTE: This medicine is only for you. Do not share this medicine with others. What if I miss a dose? It is important not to miss your dose. Call your doctor or health care professional if  you are unable to keep an appointment. What may interact with this medicine? Do not take this medicine with any of the following medications: -other medicines containing denosumab This medicine may also interact with the following medications: -medicines that lower your chance of fighting infection -steroid medicines like prednisone or cortisone This list may not describe all possible interactions. Give your health care provider a list of all the medicines, herbs, non-prescription drugs, or dietary supplements you use. Also tell them if you smoke, drink alcohol, or use illegal drugs. Some items may interact with your medicine. What should I watch for while using this medicine? Visit your doctor or health care professional for regular checks on your progress. Your doctor or health care professional may order blood tests and other tests to see how you are doing. Call your doctor or health care professional for advice if you get a fever, chills or sore throat, or other symptoms of a cold or flu. Do not treat yourself. This drug may decrease your body's ability to fight infection. Try to avoid being around people who are sick. You should make sure you get enough calcium and vitamin D while you are taking this medicine, unless your doctor tells you not to. Discuss the foods you eat and the vitamins you take with your health care professional. See your dentist regularly. Brush and floss your teeth as directed. Before you have any dental work  dental work done, tell your dentist you are receiving this medicine. Do not become pregnant while taking this medicine or for 5 months after stopping it. Talk with your doctor or health care professional about your birth control options while taking this medicine. Women should inform their doctor if they wish to become pregnant or think they might be pregnant. There is a potential for serious side effects to an unborn child. Talk to your health care professional or pharmacist for  more information. What side effects may I notice from receiving this medicine? Side effects that you should report to your doctor or health care professional as soon as possible: -allergic reactions like skin rash, itching or hives, swelling of the face, lips, or tongue -bone pain -breathing problems -dizziness -jaw pain, especially after dental work -redness, blistering, peeling of the skin -signs and symptoms of infection like fever or chills; cough; sore throat; pain or trouble passing urine -signs of low calcium like fast heartbeat, muscle cramps or muscle pain; pain, tingling, numbness in the hands or feet; seizures -unusual bleeding or bruising -unusually weak or tired Side effects that usually do not require medical attention (report to your doctor or health care professional if they continue or are bothersome): -constipation -diarrhea -headache -joint pain -loss of appetite -muscle pain -runny nose -tiredness -upset stomach This list may not describe all possible side effects. Call your doctor for medical advice about side effects. You may report side effects to FDA at 1-800-FDA-1088. Where should I keep my medicine? This medicine is only given in a clinic, doctor's office, or other health care setting and will not be stored at home. NOTE: This sheet is a summary. It may not cover all possible information. If you have questions about this medicine, talk to your doctor, pharmacist, or health care provider.  2019 Elsevier/Gold Standard (2017-09-18 16:10:44)  

## 2018-06-09 NOTE — Progress Notes (Signed)
MEDICARE ANNUAL WELLNESS VISIT AND FOLLOW UP  Assessment:    Encounter for annual medicare wellness visit   Labile hypertension - continue medications, DASH diet, exercise and monitor at home. Call if greater than 130/80.   Other abnormal glucose Recent A1Cs at goal Discussed diet/exercise, weight management  Defer A1C; check CMP  Hyperlipidemia -continue medications, check lipids, decrease fatty foods, increase activity.   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 Continue follow up  Metastasis to bone [facility]) Continue follow up  Osteoporosis  Continue medications  Vitamin D deficiency At goal at recent check; continue to recommend supplementation for goal of 70-100 Defer vitamin D level  Medication management Gets CBC, CMP monthly via oncology, defer all labs today   Breast cancer metastasized to multiple sites, left [facility]) Continue follow up   Over 30 minutes of exam, counseling, chart review, and critical decision making was performed Future Appointments  Date Time Provider [place]  06/24/2018 11:15 AM [facility] LAB 2 [facility] None  06/24/2018 12:00 PM Magrinat, [name], MD [facility] None  06/24/2018 12:45 PM [place] FLUSH [facility] None  12/24/2018 10:00 AM [name], MD [facility] None    Plan:   During the course of the visit the patient was educated and counseled about appropriate screening and preventive services including:    Pneumococcal vaccine   Influenza vaccine  Td vaccine  Prevnar 13  Screening electrocardiogram  Screening mammography  Bone densitometry screening  Colorectal cancer screening  Diabetes screening  Glaucoma screening  Nutrition counseling   Advanced directives: given info/requested copies   Subjective:   TIAIRA Rosales is a 78 y.o. female who presents for Medicare Annual Wellness Visit and 3 month  follow up on hypertension, prediabetes, hyperlipidemia, vitamin D def.   She has stage IV breast cancer, had lumpectomy in 1985 but most recently found to have left breast mass with invasive adenocarcimona on 08/29/2014, right lung nodules with recent CT chest 02/27 showing resolution of nodules, and pathological compression fractures of T3 with a tumor displacing epidural space. Following with Dr. Griffith Citron, was on letrozole and on denosumab for osteoporosis, now on fulvestrant and denosumab (injected every other month). She recently in 04/2018 had R lumpectomy by Dr. Dalbert Batman but no lymph node biopsy in light of stage 4 metastatic disease.  BMI is Body mass index is 22.9 kg/m., she has not been working on diet and exercise. Wt Readings from Last 3 Encounters:  06/10/18 123 lb 3.2 oz (55.9 kg)  05/03/18 122 lb 5.7 oz (55.5 kg)  04/01/18 123 lb 3.2 oz (55.9 kg)   Her blood pressure has been controlled at home, today their BP is BP: 122/70 She does not workout. She denies chest pain, shortness of breath, dizziness.   She is not on cholesterol medication and denies myalgias. Her cholesterol is not at goal. The cholesterol last visit was:   Lab Results  Component Value Date   CHOL 184 11/25/2017   HDL 55 11/25/2017   LDLCALC 109 (H) 11/25/2017   TRIG 100 11/25/2017   CHOLHDL 3.3 11/25/2017   She has been working on diet and exercise for glucose management, and denies paresthesia of the feet, polydipsia, polyuria and visual disturbances. Last A1C in the office was:  Lab Results  Component Value Date   HGBA1C 5.1 11/25/2017   Patient is on Vitamin D supplement. Lab Results  Component Value Date   VD25OH 74 11/25/2017  Medication Review Current Outpatient Medications on File Prior to Visit  Medication Sig Dispense Refill  . Ascorbic Acid (VITAMIN C PO) Take 1 tablet by mouth every other day. Takes qod    . aspirin EC 325 MG tablet Take 1 tablet (325 mg total) by mouth daily. 30  tablet 0  . Cholecalciferol (VITAMIN D3 ADULT GUMMIES PO) Take 2,000 Units by mouth daily.     . Cyanocobalamin (VITAMIN B-12 PO) Take by mouth daily. Takes every other day    . Iron-Vitamins (GERITOL PO) Take by mouth daily.     No current facility-administered medications on file prior to visit.     Current Problems (verified) Patient Active Problem List   Diagnosis Date Noted  . Neuropathy 03/27/2016  . Malignant neoplasm of upper-outer quadrant of left breast in female, estrogen receptor positive ([place]) 09/11/2015  . BMI 22.0-22.9, adult 03/14/2015  . Metastasis to bone ([place]) 10/20/2014  . Breast cancer metastasized to multiple sites ([place]) 08/28/2014  . 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 Medication management 07/07/2013  . Labile hypertension   . Hyperlipidemia   . Other abnormal glucose   . Vitamin D deficiency   . Osteoporosis   . IBS (irritable bowel syndrome)     Screening Tests Immunization History  Administered Date(s) Administered  . Influenza Split 02/07/2013, 02/27/2017  . Influenza, High Dose Seasonal PF 03/14/2015, 03/24/2016, 03/05/2018  . Td 01/05/2008    Preventative care: Last colonoscopy: Never, declines all screening - will do hemoccut Last mammogram: 11/2017 MRI breast 02/2018 Last pap smear/pelvic exam: s/p TAH DEXA: 08/2016 - osteoporosis - no longer on treatment  CT Chest 06/2015 CT AB 07/2014  Prior vaccinations: TD or Tdap: 2009 - declines Influenza: 2019  Pneumococcal: declines Prevnar13: declines Shingles/Zostavax: declines  Names of Other Physician/Practitioners you currently use: 1. [facility]- here for primary care 2. NONE, eye doctor, declines, readers and does well  3. unknown, dentist, last visit 02/2018, goes q40m  Patient Care Team: [name], MD as PCP - General (Internal Medicine) Magrinat, [name], MD as Consulting  Physician (Oncology) [name], MD as Consulting Physician (Orthopedic Surgery) [name], MD as Consulting Physician (General Surgery)  Past Surgical History:  Procedure Laterality Date  . ABDOMINAL HYSTERECTOMY     1980  . APPENDECTOMY  1977  . APPLICATION OF INTRAOPERATIVE CT SCAN N/A 10/20/2014   Procedure: APPLICATION OF INTRAOPERATIVE CAT SCAN;  Surgeon: [name], MD;  Location: [place] [facility];  Service: Neurosurgery;  Laterality: N/A;  . BREAST LUMPECTOMY WITH RADIOACTIVE SEED LOCALIZATION Left 05/03/2018   Procedure: LEFT BREAST LUMPECTOMY WITH BRACKETED RADIOACTIVE SEED LOCALIZATION;  Surgeon: [name], MD;  Location: [place];  Service: General;  Laterality: Left;  . BREAST SURGERY Right 1985  . THYROID CYST EXCISION  1967   Family History  Problem Relation Age of Onset  . Heart disease Mother   . Hypertension Mother   . Heart disease Father   . Diabetes Father   . Breast cancer Paternal Aunt        4 paternal aunts with breast cancer over 32  . Prostate cancer Paternal Uncle   . Stroke Paternal Grandfather   . Ovarian cancer Paternal Aunt   . Huntington's disease Other        Nephew, inherited from his father   Social History   Tobacco Use  . Smoking status: Former Smoker  Packs/day: 0.50    Years: 10.00    Pack years: 5.00    Types: Cigarettes    Last attempt to quit: 05/03/1970    Years since quitting: 48.1  . Smokeless tobacco: Former Systems developer  . Tobacco comment: quit smoking 1970's  Substance Use Topics  . Alcohol use: Yes    Alcohol/week: 0.0 standard drinks    Comment: Rare  . Drug use: No    MEDICARE WELLNESS OBJECTIVES: Tobacco use: She does not smoke.  Patient is a former smoker. If yes, counseling given Alcohol Current alcohol use: none Osteoporosis: postmenopausal estrogen deficiency, dietary calcium and/or vitamin D deficiency and amenorrhea, History of fracture in the past year: yes Diet: in general, a  "healthy" diet   Physical activity: Current Exercise Habits: The patient does not participate in regular exercise at present, Exercise limited by: None identified Cardiac risk factors: Cardiac Risk Factors include: advanced age (>16men, >66 women);hypertension;sedentary lifestyle Depression/mood screen:   Depression screen [facility] 2/9 06/10/2018  Decreased Interest 0  Down, Depressed, Hopeless 0  PHQ - 2 Score 0  Some recent data might be hidden    ADLs:  In your present state of health, do you have any difficulty performing the following activities: 06/10/2018 11/28/2017  Hearing? N N  Vision? N N  Difficulty concentrating or making decisions? N N  Walking or climbing stairs? N N  Dressing or bathing? N N  Doing errands, shopping? N -  Some recent data might be hidden     Cognitive Testing  Alert? Yes  Normal Appearance?Yes  Oriented to person? Yes  Place? Yes   Time? Yes  Recall of three objects?  Yes  Can perform simple calculations? Yes  Displays appropriate judgment?Yes  Can read the correct time from a watch face?Yes  EOL planning: Does Patient Have a Medical Advance Directive?: No Would patient like information on creating a medical advance directive?: Yes (MAU/Ambulatory/Procedural Areas - Information given)   Objective:   Today's Vitals   06/10/18 1023  BP: 122/70  Pulse: 82  Temp: 97.7 F (36.5 C)  SpO2: 98%  Weight: 123 lb 3.2 oz (55.9 kg)  Height: 5' 1.5" (1.562 m)   Body mass index is 22.9 kg/m.  General appearance: alert, no distress, WD/WN,  female HEENT: normocephalic, sclerae anicteric, TMs pearly, nares patent, no discharge or erythema, pharynx normal, no thrush, + maxillary tenderness to palpation Oral cavity: MMM, no lesions Neck: supple, no lymphadenopathy, no thyromegaly, no masses Heart: RRR, normal S1, S2, no murmurs Lungs: CTA bilaterally, no wheezes, rhonchi, or rales Abdomen: +bs, soft, non tender, non distended, no masses, no hepatomegaly, no  splenomegaly Musculoskeletal: cervical/throacic kyphosis, nontender, no swelling, no obvious deformity Extremities: no edema, no cyanosis, no clubbing Pulses: 2+ symmetric, upper and lower extremities, normal cap refill Neurological: alert, oriented x 3, CN2-12 intact, strength normal upper extremities and lower extremities, sensation normal throughout, DTRs 2+ throughout, no cerebellar signs, gait normal Psychiatric: normal affect, behavior normal, pleasant  Breast: defer Gyn: defer Rectal: defer   Medicare Attestation I have personally reviewed: The patient's medical and social history Their use of alcohol, tobacco or illicit drugs Their current medications and supplements The patient's functional ability including ADLs,fall risks, home safety risks, cognitive, and hearing and visual impairment Diet and physical activities Evidence for depression or mood disorders  The patient's weight, height, BMI, and visual acuity have been recorded in the chart.  I have made referrals, counseling, and provided education to the patient based on  review of the above and I have provided the patient with a written personalized care plan for preventive services.     Izora Ribas, NP   06/10/2018

## 2018-06-10 ENCOUNTER — Encounter: Payer: Self-pay | Admitting: Adult Health

## 2018-06-10 ENCOUNTER — Ambulatory Visit (INDEPENDENT_AMBULATORY_CARE_PROVIDER_SITE_OTHER): Payer: Medicare Other | Admitting: Adult Health

## 2018-06-10 VITALS — BP 122/70 | HR 82 | Temp 97.7°F | Ht 61.5 in | Wt 123.2 lb

## 2018-06-10 DIAGNOSIS — Z170 Estrogen receptor positive status [ER+]: Secondary | ICD-10-CM

## 2018-06-10 DIAGNOSIS — M810 Age-related osteoporosis without current pathological fracture: Secondary | ICD-10-CM

## 2018-06-10 DIAGNOSIS — C7951 Secondary malignant neoplasm of bone: Secondary | ICD-10-CM

## 2018-06-10 DIAGNOSIS — R0989 Other specified symptoms and signs involving the circulatory and respiratory systems: Secondary | ICD-10-CM

## 2018-06-10 DIAGNOSIS — K589 Irritable bowel syndrome without diarrhea: Secondary | ICD-10-CM | POA: Diagnosis not present

## 2018-06-10 DIAGNOSIS — G629 Polyneuropathy, unspecified: Secondary | ICD-10-CM

## 2018-06-10 DIAGNOSIS — Z0001 Encounter for general adult medical examination with abnormal findings: Secondary | ICD-10-CM | POA: Diagnosis not present

## 2018-06-10 DIAGNOSIS — Z0000 Encounter for general adult medical examination without abnormal findings: Secondary | ICD-10-CM

## 2018-06-10 DIAGNOSIS — Z79899 Other long term (current) drug therapy: Secondary | ICD-10-CM

## 2018-06-10 DIAGNOSIS — E559 Vitamin D deficiency, unspecified: Secondary | ICD-10-CM

## 2018-06-10 DIAGNOSIS — C50912 Malignant neoplasm of unspecified site of left female breast: Secondary | ICD-10-CM

## 2018-06-10 DIAGNOSIS — R7309 Other abnormal glucose: Secondary | ICD-10-CM

## 2018-06-10 DIAGNOSIS — C50412 Malignant neoplasm of upper-outer quadrant of left female breast: Secondary | ICD-10-CM

## 2018-06-10 DIAGNOSIS — R6889 Other general symptoms and signs: Secondary | ICD-10-CM

## 2018-06-10 DIAGNOSIS — E782 Mixed hyperlipidemia: Secondary | ICD-10-CM

## 2018-06-10 NOTE — Patient Instructions (Addendum)
April Rosales , Thank you for taking time to come for your Medicare Wellness Visit. I appreciate your ongoing commitment to your health goals. Please review the following plan we discussed and let me know if I can assist you in the future.   These are the goals we discussed: Goals    . Exercise 150 min/wk Moderate Activity     Weight bearing or resistance exercises for bone and muscle health       This is a list of the screening recommended for you and due dates:  Health Maintenance  Topic Date Due  . Tetanus Vaccine  06/11/2019*  . Pneumonia vaccines (1 of 2 - PCV13) 06/11/2019*  . Flu Shot  Completed  . DEXA scan (bone density measurement)  Completed  *Topic was postponed. The date shown is not the original due date.     Osteoporosis  Osteoporosis is thinning and loss of density in your bones. Osteoporosis makes bones more brittle and fragile and more likely to break (fracture). Over time, osteoporosis can cause your bones to become so weak that they fracture after a minor fall. Bones in the hip, wrist, and spine are most likely to fracture due to osteoporosis. What are the causes? The exact cause of this condition is not known. What increases the risk? You may be at greater risk for osteoporosis if you:  Have a family history of the condition.  Have poor nutrition.  Use steroid medicines, such as prednisone.  Are female.  Are age 27 or older.  Smoke or have a history of smoking.  Are not physically active (are sedentary).  Are white (Caucasian) or of Asian descent.  Have a small body frame.  Take certain medicines, such as antiseizure medicines. What are the signs or symptoms? A fracture might be the first sign of osteoporosis, especially if the fracture results from a fall or injury that usually would not cause a bone to break. Other signs and symptoms include:  Pain in the neck or low back.  Stooped posture.  Loss of height. How is this diagnosed? This  condition may be diagnosed based on:  Your medical history.  A physical exam.  A bone mineral density test, also called a DXA or DEXA test (dual-energy X-ray absorptiometry test). This test uses X-rays to measure the amount of minerals in your bones. How is this treated? The goal of treatment is to strengthen your bones and lower your risk for a fracture. Treatment may involve:  Making lifestyle changes, such as: ? Including foods with more calcium and vitamin D in your diet. ? Doing weight-bearing and muscle-strengthening exercises. ? Stopping tobacco use. ? Limiting alcohol intake.  Taking medicine to slow the process of bone loss or to increase bone density.  Taking daily supplements of calcium and vitamin D.  Taking hormone replacement medicines, such as estrogen for women and testosterone for men.  Monitoring your levels of calcium and vitamin D. Follow these instructions at home:  Activity  Exercise as told by your health care provider. Ask your health care provider what exercises and activities are safe for you. You should do: ? Exercises that make you work against gravity (weight-bearing exercises), such as tai chi, yoga, or walking. ? Exercises to strengthen muscles, such as lifting weights. Lifestyle  Limit alcohol intake to no more than 1 drink a day for nonpregnant women and 2 drinks a day for men. One drink equals 12 oz of beer, 5 oz of wine, or 1 oz  of hard liquor.  Do not use any products that contain nicotine or tobacco, such as cigarettes and e-cigarettes. If you need help quitting, ask your health care provider. Preventing falls  Use devices to help you move around (mobility aids) as needed, such as canes, walkers, scooters, or crutches.  Keep rooms well-lit and clutter-free.  Remove tripping hazards from walkways, including cords and throw rugs.  Install grab bars in bathrooms and safety rails on stairs.  Use rubber mats in the bathroom and other areas  that are often wet or slippery.  Wear closed-toe shoes that fit well and support your feet. Wear shoes that have rubber soles or low heels.  Review your medicines with your health care provider. Some medicines can cause dizziness or changes in blood pressure, which can increase your risk of falling. General instructions  Include calcium and vitamin D in your diet. Calcium is important for bone health, and vitamin D helps your body to absorb calcium. Good sources of calcium and vitamin D include: ? Certain fatty fish, such as salmon and tuna. ? Products that have calcium and vitamin D added to them (fortified products), such as fortified cereals. ? Egg yolks. ? Cheese. ? Liver.  Take over-the-counter and prescription medicines only as told by your health care provider.  Keep all follow-up visits as told by your health care provider. This is important. Contact a health care provider if:  You have never been screened for osteoporosis and you are: ? A woman who is age 62 or older. ? A man who is age 62 or older. Get help right away if:  You fall or injure yourself. Summary  Osteoporosis is thinning and loss of density in your bones. This makes bones more brittle and fragile and more likely to break (fracture),even with minor falls.  The goal of treatment is to strengthen your bones and reduce your risk for a fracture.  Include calcium and vitamin D in your diet. Calcium is important for bone health, and vitamin D helps your body to absorb calcium.  Talk with your health care provider about screening for osteoporosis if you are a woman who is age 43 or older, or a man who is age 35 or older. This information is not intended to replace advice given to you by your health care provider. Make sure you discuss any questions you have with your health care provider. Document Released: 02/19/2005 Document Revised: 03/06/2017 Document Reviewed: 03/06/2017 Elsevier Interactive Patient Education   2019 [employer].

## 2018-06-23 NOTE — Progress Notes (Signed)
April Rosales  Telephone:([phone] Fax:(336) [phone]   ID: April Rosales DOB: 1941-03-23  MR#: [id number]  PYP#:[id number]  Patient Care Team: [name], MD as PCP - General (Internal Medicine) Nidal Rivet, [name], MD as Consulting Physician (Oncology) [name], MD as Consulting Physician (Orthopedic Surgery) [name], MD as Consulting Physician (General Surgery) PCP: [name], MD  OTHER: [name], DDS  CHIEF COMPLAINT: Stage IV estrogen receptor positive breast cancer  CURRENT TREATMENT:   Fulvestrant, denosumab/Xgeva;   INTERVAL HISTORY: April Rosales returns today for follow-up and treatment of her estrogen receptor positive breast cancer accompanied by her family member. She receives fulvestrant every 4 weeks with her most recent dose on 05/27/2018. She tolerates this well.   She also receives denosumab/Xgeva every 8 weeks with her most recent dose on 05/27/2018. She also tolerates this well.  Since her last visit, she underwent left partial mastectomy on 05/03/2018. Pathology [id number]) from the procedure revealed: invasive ductal carcinoma, grade 2/3, spanning 1.0 cm; ductal carcinoma in situ, high grade; negative resection margins. Prognostic indicators significant for: estrogen receptor, 60% positive with strong staining intensity, and progesterone receptor, 0% negative. Proliferation marker Ki67 at 53%. HER2 equivocal by immunohistochemistry, 2+ but negative by fluorescent in situ hybridization with a signals ratio 1.65 and number per cell 4.95. An additional analysis was performed showing a signals ratio 1.5 and number per cell 4.68.   REVIEW OF SYSTEMS: April Rosales reports doing well overall. She denies bleeding, draining, fever following her mastectomy. She asked "if the cancer's all gone, why do I have to keep getting the shots?" The patient denies unusual headaches, visual changes, nausea, vomiting, stiff neck, dizziness, or gait  imbalance. There has been no cough, phlegm production, or pleurisy, no chest pain or pressure, and no change in bowel or bladder habits. The patient denies fever, rash, bleeding, unexplained fatigue or unexplained weight loss. A detailed review of systems was otherwise entirely negative.   BREAST CANCER HISTORY: From the original intake note:   April Rosales underwent right lumpectomy in [facility] at [facility] for what sounds like a stage I breast cancer. She tells me she had more than 30 lymph nodes removed from her right axilla and all of them were clear. She received  adjuvant radiation but no systemic treatment.  The patient had recently refused mammography with the last mammogram I can find dating back to August 2010.  More recently the patient presented with left scapular pain radiating down the left arm.  She was evaluated by Dr. [name], who obtained a chest x-ray showing a possible abnormality at T3. He then set up the patient for an MRI of the thoracic spine performed 08/16/2014.  This showed multiple compression fractures  (a similar picture had been noted on lumbar MRI 10/09/2011 ). However at T3 they noted tumor in the vertebral body extending into the left pedicle and into the lateral epidural space, displacing the cord to the right. There was no evidence of cord compression or cord signal abnormality. There were no other areas of tumor identified in the thoracic spine.         The patient was then referred to Dr. Hal Neer who on 08/24/2014 set April Rosales up for CT scans of the chest, abdomen and pelvis. There was a dense mass in the upper inner quadrant of the left breast measuring 1.6 cm. There was a 1.2 cm nodule in the minor fissure of the right lung and some evidence of right lung fibrosis  at the site of the prior radiation port.  There was also a thyroid mass measuring 2 cm. However there were no parenchymal lung or liver lesions. Incidental meningoceles were noted as well as sclerosis of  the fifth and sixth ribs which were felt to be likely posttraumatic.   On 08/29/2014 the patient underwent bilateral diagnostic mammography with tomosynthesis and left breast ultrasonography.  There were postsurgical changes in the upper right breast. In the left breast there was an irregular mass measuring 2.3 cm in the upper inner quadrant. This was palpable. Ultrasound showed this to be hypoechoic and to measure 2.0 cm. There were adjacent areas of nodularity. There was no definite lymphadenopathy in the left axilla.  Biopsy of this breast mass 08/29/2014 showed an invasive adenocarcinoma with both ductal and lobular features (there was strong diffuse E-cadherin expression as well as areas with total absence of E-cadherin expression), with the preliminary prognostic profile showing strong estrogen positivity, very weak to near absent progesterone positivity, an MIB-1 of approximately 40%, and HER-2 equivocal  The patient's subsequent history is as detailed below.   PAST MEDICAL HISTORY: Past Medical History:  Diagnosis Date  . Arthritis   . Breast cancer [facility]) 1985/2016   takes [name] daily  . Chronic back pain    stenosis/listhesis  . Family history of ovarian cancer   . Osteoporosis    takes Vit D  . Radiation 11/23/14-12/11/14   30 Gy T1-T5     PAST SURGICAL HISTORY: Past Surgical History:  Procedure Laterality Date  . ABDOMINAL HYSTERECTOMY     1980  . APPENDECTOMY  1977  . APPLICATION OF INTRAOPERATIVE CT SCAN N/A 10/20/2014   Procedure: APPLICATION OF INTRAOPERATIVE CAT SCAN;  Surgeon: [name], MD;  Location: [place] [facility];  Service: Neurosurgery;  Laterality: N/A;  . BREAST LUMPECTOMY WITH RADIOACTIVE SEED LOCALIZATION Left 05/03/2018   Procedure: LEFT BREAST LUMPECTOMY WITH BRACKETED RADIOACTIVE SEED LOCALIZATION;  Surgeon: [name], MD;  Location: [place];  Service: General;  Laterality: Left;  . BREAST SURGERY Right 1985  . THYROID CYST  EXCISION  1967    FAMILY HISTORY Family History  Problem Relation Age of Onset  . Heart disease Mother   . Hypertension Mother   . Heart disease Father   . Diabetes Father   . Breast cancer Paternal Aunt        4 paternal aunts with breast cancer over 23  . Prostate cancer Paternal Uncle   . Stroke Paternal Grandfather   . Ovarian cancer Paternal Aunt   . Huntington's disease Other        Nephew, inherited from his father   The patient's father died from a heart attack at the age of 18. He had 9 sisters. 3 of those sisters had breast cancer, all in a menopausal setting. Another sister had ovarian cancer. One of the paternal uncles had cancer of the colon "and back".  The patient's mother died at the age of 11. She was found to have breast cancer shortly before dying , during her final hospitalization.  GYNECOLOGIC HISTORY:  No LMP recorded. Patient has had a hysterectomy.  Menarche age 8, first live birth age 41, the patient is GX P1. She underwent hysterectomy in 1980. She thinks the ovaries were removed, but the CT scan obtained 08/24/2014 showed a definite right ovary. The left ovary may have been removed. She did not take hormone replacement after the hysterectomy.  SOCIAL HISTORY:   Annaliese worked in Architect  administration. She is divorced, lives alone, with 2 cats. Her son Bethann Berkshire lives in [place] where he works in Engineer, technical sales. He has 4 children of his own. The patient is a Psychologist, forensic.    ADVANCED DIRECTIVES:  In place. The patient has named her sister April Rosales [phone]) and her friend April Rosales [phone]) as joint healthcare powers of attorney   HEALTH MAINTENANCE: Social History   Tobacco Use  . Smoking status: Former Smoker    Packs/day: 0.50    Years: 10.00    Pack years: 5.00    Types: Cigarettes    Last attempt to quit: 05/03/1970    Years since quitting: 48.1  . Smokeless tobacco: Former Systems developer  . Tobacco comment: quit smoking 1970's  Substance  Use Topics  . Alcohol use: Yes    Alcohol/week: 0.0 standard drinks    Comment: Rare  . Drug use: No     Colonoscopy: never  PAP: status post hysterectomy  Bone density: 09/12/2016 [facility]/ T score of -4.8 osteoporosis  Lipid panel:  Allergies  Allergen Reactions  . Ativan [Lorazepam]     Made her crazy  . Dilaudid [Hydromorphone Hcl]     Doesn't want  . Haldol [Haloperidol Lactate]     Made her crazy    Current Outpatient Medications  Medication Sig Dispense Refill  . Ascorbic Acid (VITAMIN C PO) Take 1 tablet by mouth every other day. Takes qod    . aspirin EC 325 MG tablet Take 1 tablet (325 mg total) by mouth daily. 30 tablet 0  . Cholecalciferol (VITAMIN D3 ADULT GUMMIES PO) Take 2,000 Units by mouth daily.     . Cyanocobalamin (VITAMIN B-12 PO) Take by mouth daily. Takes every other day    . Iron-Vitamins (GERITOL PO) Take by mouth daily.     No current facility-administered medications for this visit.     OBJECTIVE: Older white woman in no acute distress  Vitals:   06/24/18 1124  BP: (!) 133/30  Pulse: 84  Resp: 18  Temp: 98.2 F (36.8 C)  SpO2: 98%     Body mass index is 22.75 kg/m.    ECOG FS:1 - Symptomatic but completely ambulatory  Sclerae unicteric, EOMs intact Oropharynx clear and moist No cervical or supraclavicular adenopathy Lungs no rales or rhonchi Heart regular rate and rhythm Abd soft, nontender, positive bowel sounds MSK significant kyphosis and scoliosis but no focal spinal tenderness, no upper extremity lymphedema Neuro: nonfocal, well oriented, appropriate affect Breasts: The right breast is status post remote lumpectomy and radiation.  There is no evidence of disease recurrence.  The left breast is status post recent lumpectomy.  The incision is healing very nicely.  The cosmetic result is excellent.  Both axillae are benign.    LAB RESULTS:  CMP     Component Value Date/Time   NA 140 06/24/2018 1112   NA 141 05/29/2017 1417   K  5.0 06/24/2018 1112   K 3.6 05/29/2017 1417   CL 104 06/24/2018 1112   CO2 29 06/24/2018 1112   CO2 27 05/29/2017 1417   GLUCOSE 86 06/24/2018 1112   GLUCOSE 111 05/29/2017 1417   BUN 15 06/24/2018 1112   BUN 17.0 05/29/2017 1417   CREATININE 0.71 06/24/2018 1112   CREATININE 0.8 05/29/2017 1417   CALCIUM 10.5 (H) 06/24/2018 1112   CALCIUM 10.7 (H) 06/26/2017 1216   CALCIUM 10.4 05/29/2017 1417   PROT 7.0 06/24/2018 1112   PROT 7.3 05/29/2017 1417   ALBUMIN 3.7  06/24/2018 1112   ALBUMIN 3.9 05/29/2017 1417   AST 17 06/24/2018 1112   AST 17 05/29/2017 1417   ALT 13 06/24/2018 1112   ALT 15 05/29/2017 1417   ALKPHOS 42 06/24/2018 1112   ALKPHOS 42 05/29/2017 1417   BILITOT 0.3 06/24/2018 1112   BILITOT 0.30 05/29/2017 1417   GFRNONAA >60 06/24/2018 1112   GFRNONAA 85 07/07/2016 1125   GFRAA >60 06/24/2018 1112   GFRAA >89 07/07/2016 1125    INo results found for: SPEP, UPEP  Lab Results  Component Value Date   WBC 5.3 06/24/2018   NEUTROABS 3.2 06/24/2018   HGB 14.3 06/24/2018   HCT 43.5 06/24/2018   MCV 92.9 06/24/2018   PLT 242 06/24/2018      Chemistry      Component Value Date/Time   NA 140 06/24/2018 1112   NA 141 05/29/2017 1417   K 5.0 06/24/2018 1112   K 3.6 05/29/2017 1417   CL 104 06/24/2018 1112   CO2 29 06/24/2018 1112   CO2 27 05/29/2017 1417   BUN 15 06/24/2018 1112   BUN 17.0 05/29/2017 1417   CREATININE 0.71 06/24/2018 1112   CREATININE 0.8 05/29/2017 1417      Component Value Date/Time   CALCIUM 10.5 (H) 06/24/2018 1112   CALCIUM 10.7 (H) 06/26/2017 1216   CALCIUM 10.4 05/29/2017 1417   ALKPHOS 42 06/24/2018 1112   ALKPHOS 42 05/29/2017 1417   AST 17 06/24/2018 1112   AST 17 05/29/2017 1417   ALT 13 06/24/2018 1112   ALT 15 05/29/2017 1417   BILITOT 0.3 06/24/2018 1112   BILITOT 0.30 05/29/2017 1417       Lab Results  Component Value Date   LABCA2 24 01/01/2016    No components found for: [id number]  No results for  input(s): INR in the last 168 hours.  Urinalysis    Component Value Date/Time   COLORURINE YELLOW 11/25/2017 1143   APPEARANCEUR CLEAR 11/25/2017 1143   LABSPEC 1.016 11/25/2017 1143   PHURINE 7.0 11/25/2017 1143   GLUCOSEU NEGATIVE 11/25/2017 1143   HGBUR NEGATIVE 11/25/2017 1143   BILIRUBINUR NEGATIVE 09/19/2015 1239   KETONESUR NEGATIVE 11/25/2017 1143   PROTEINUR NEGATIVE 11/25/2017 1143   NITRITE NEGATIVE 11/25/2017 1143   LEUKOCYTESUR 1+ (A) 11/25/2017 1143    STUDIES: No results found.  ASSESSMENT: 78 y.o. [place] woman with stage IV left breast cancer involving bone  (1) status post right lumpectomy and axillary node dissection in 1985 followed by radiation at [facility] 08/16/2014: measurable disease in spine, lung and left breast   (2) evaluation for left shoulder pain led to thoracic spine MRI 08/16/2014 showing a pathologic fracture at T3 with epidural tumor displacing the cord to the right, but no cord compression. CT scans of the chest, abdomen and pelvis 08/24/2014 showed in addition a mass in the upper outer quadrant left breast measuring 1.6 cm and a nodule in the minor fissure of the right lung measuring 1.2 cm, but no parenchymal lung or liver lesions.   (a) CA 27-29 was noninformative at 38 (09/20/2014)    (3) mammography and ultrasonography 08/29/2014 show a mass in the upper inner left breast which was palpable,  measuring 2.0 cm by ultrasound. Biopsy of this mass 08/29/2014 showed an invasive breast cancer with both lobular and ductal features, estrogen receptor positive, progesterone receptor weakly positive, with an MIB-1 in the 40% range, HER-2 equivocal (6 else ratio 1.5, but average number her nucleus 5.8)   (  4) letrozole started 08/31/2014;   (a) palbociclib added Sept 2016 at 75 mg 21/7, with significant neutropenia; not repeated after first cycle  (b) letrozole discontinued 05/29/2017 with evidence of disease progression in the  breast   (5)  zolendronate started 09/20/2014, stopped after initial dose due to poor tolerance  (a) denosumab/ Xgeva started 01/31/2015, repeated every 4 weeks  (b) changed to every 8 weeks as of August 2018.    (6) on 10/20/2014 the patient underwent T2-T3 and T4 decompressive laminectomy with removal of epidural tumor, C7-T4 segmental pedicle screw instrumentation with virage screw system with arrow guidance protocol and C7-T4 posterolateral fusion. The cells were positive for the estrogen receptor. HER-2/neu testing by [facility] showed again equivocal results,  (a) most recent bone scan 10/01/2016 finds no new lesions only postoperative changes  (7) radiation 11/23/2014-12/11/2014.  (a) T1-T5 was treated to 30 Gy in 12 fractions at 2.5 Gy per fraction   (8)  consider eventual left lumpectomy or mastectomy depending on  longer-term results of systemic therapy  (a) most recent left breast ultrasonography 10/30/2016 found this mass to measure 0.7 cm  (b) repeat left breast ultrasonography 05/13/2017 showed the upper outer quadrant mass to have grown to 1.4 cm  (c) left breast ultrasound 09/08/2017 shows the mass to now measure 0.9 cm  (d) left breast biopsy x2 on 12/11/2017 shows high-grade ductal carcinoma in situ, essentially estrogen receptor negative  (9) genetics testing using the Breast/Ovarian Cancer Panel through [facility] [name], MD) found no deleterious mutations in ATM, BARD1, BRCA1, BRCA2, BRIP1, CDH1, CHEK2, EPCAM, FANCC, MLH1, MSH2, MSH6, NBN, PALB2, PMS2, PTEN, RAD51C, RAD51D, STK11, TP53, or XRCC2    (10) right thyroid nodule is a complex cyst as noted on CT scan of the neck 01/29/2016  (11) osteoporosis: Bone density at [facility] 09/12/2016 showed a T score of -4.8.  (a) continue denosumab/Xgeva as above  (12) fulvestrant started 05/29/2017  (a) started palbociclib 06/29/2017 at 75 mg every other day for 21 days on, 7 days off  (b) palbociclib discontinued on 3/29 due to  progressive fatigue and patient preference  (13)  was 2 to start abemaciclib 02/04/2018, but patient opted for going back to palbociclib at a lower dose beginning in November 19, patient subsequently declined s Palbociclib, and  proceeded to surgery  (14) status post left lumpectomy 05/03/2018 showing a pT1b pNX invasive ductal carcinoma, grade 2, with equivocal HER-2 results  (a) opted against adjuvant radiation given presence of stage IV disease   PLAN: Jaclene did remarkably well with her surgery.  She continues on Faslodex and she would like to consider stopping that.  She understands that we had documented stage IV disease.  Stage IV disease is not curable and patients are generally treated indefinitely.  Because she did have stage IV disease I do not feel strongly about her proceeding to radiation.  She herself would prefer not to do that.  We are going to be following her breast closely and certainly if there is any evidence of local recurrence she would receive radiation at that time  What she would like to do is to get restaged and if everything is negative then stop the Faslodex and start observation  I have set her up for mammography with tomography and a PET scan in April.  Recall that her bone scan did not show any lesions so I would not think a repeat bone scan would be informative.  She will then see me shortly after that.  If  everything is negative, we will stop the Faslodex and start seeing her on an every 70-monthbasis with scans  She does have a little bit of numbness in the upper spine.  She has had surgery there.  She was benefiting from gabapentin in that regard and she would like to start it again.  She has some at home.  If she tries it and it is helpful I will be glad to write that for her.  She denies any history of falls confusion or gait problems with that medication.  She does know to call for any other issue that may develop before her next visit here.  GVirgie Dad Colin Ellers, MD  06/24/18 12:04 PM Medical Oncology and Hematology [facility] [address]Tel. [phone]   Fax. [phone]  I, KWilburn Mylar am acting as scribe for Dr. GVirgie Dad Garima Chronis.  I, GLurline DelMD, have reviewed the above documentation for accuracy and completeness, and I agree with the above.

## 2018-06-24 ENCOUNTER — Inpatient Hospital Stay: Payer: Medicare Other

## 2018-06-24 ENCOUNTER — Ambulatory Visit: Payer: Medicare Other

## 2018-06-24 ENCOUNTER — Other Ambulatory Visit: Payer: Medicare Other

## 2018-06-24 ENCOUNTER — Inpatient Hospital Stay (HOSPITAL_BASED_OUTPATIENT_CLINIC_OR_DEPARTMENT_OTHER): Payer: Medicare Other | Admitting: Oncology

## 2018-06-24 ENCOUNTER — Telehealth: Payer: Self-pay | Admitting: Oncology

## 2018-06-24 VITALS — BP 133/30 | HR 84 | Temp 98.2°F | Resp 18 | Ht 61.5 in | Wt 122.4 lb

## 2018-06-24 DIAGNOSIS — C7951 Secondary malignant neoplasm of bone: Secondary | ICD-10-CM

## 2018-06-24 DIAGNOSIS — Z87891 Personal history of nicotine dependence: Secondary | ICD-10-CM

## 2018-06-24 DIAGNOSIS — Z5111 Encounter for antineoplastic chemotherapy: Secondary | ICD-10-CM | POA: Diagnosis not present

## 2018-06-24 DIAGNOSIS — C50912 Malignant neoplasm of unspecified site of left female breast: Secondary | ICD-10-CM

## 2018-06-24 DIAGNOSIS — D0512 Intraductal carcinoma in situ of left breast: Secondary | ICD-10-CM | POA: Diagnosis not present

## 2018-06-24 DIAGNOSIS — M810 Age-related osteoporosis without current pathological fracture: Secondary | ICD-10-CM

## 2018-06-24 DIAGNOSIS — C50412 Malignant neoplasm of upper-outer quadrant of left female breast: Secondary | ICD-10-CM

## 2018-06-24 DIAGNOSIS — C50919 Malignant neoplasm of unspecified site of unspecified female breast: Secondary | ICD-10-CM

## 2018-06-24 LAB — CBC WITH DIFFERENTIAL/PLATELET
Abs Immature Granulocytes: 0.01 10*3/uL (ref 0.00–0.07)
Basophils Absolute: 0 10*3/uL (ref 0.0–0.1)
Basophils Relative: 1 %
EOS ABS: 0.3 10*3/uL (ref 0.0–0.5)
Eosinophils Relative: 5 %
HCT: 43.5 % (ref 36.0–46.0)
Hemoglobin: 14.3 g/dL (ref 12.0–15.0)
Immature Granulocytes: 0 %
Lymphocytes Relative: 24 %
Lymphs Abs: 1.2 10*3/uL (ref 0.7–4.0)
MCH: 30.6 pg (ref 26.0–34.0)
MCHC: 32.9 g/dL (ref 30.0–36.0)
MCV: 92.9 fL (ref 80.0–100.0)
MONOS PCT: 11 %
Monocytes Absolute: 0.6 10*3/uL (ref 0.1–1.0)
Neutro Abs: 3.2 10*3/uL (ref 1.7–7.7)
Neutrophils Relative %: 59 %
Platelets: 242 10*3/uL (ref 150–400)
RBC: 4.68 MIL/uL (ref 3.87–5.11)
RDW: 14.3 % (ref 11.5–15.5)
WBC: 5.3 10*3/uL (ref 4.0–10.5)
nRBC: 0 % (ref 0.0–0.2)

## 2018-06-24 LAB — COMPREHENSIVE METABOLIC PANEL
ALT: 13 U/L (ref 0–44)
AST: 17 U/L (ref 15–41)
Albumin: 3.7 g/dL (ref 3.5–5.0)
Alkaline Phosphatase: 42 U/L (ref 38–126)
Anion gap: 7 (ref 5–15)
BUN: 15 mg/dL (ref 8–23)
CO2: 29 mmol/L (ref 22–32)
Calcium: 10.5 mg/dL — ABNORMAL HIGH (ref 8.9–10.3)
Chloride: 104 mmol/L (ref 98–111)
Creatinine, Ser: 0.71 mg/dL (ref 0.44–1.00)
GFR calc Af Amer: >60 mL/min (ref >60)
GFR calc non Af Amer: >60 mL/min (ref >60)
Glucose, Bld: 86 mg/dL (ref 70–99)
Potassium: 5 mmol/L (ref 3.5–5.1)
Sodium: 140 mmol/L (ref 135–145)
Total Bilirubin: 0.3 mg/dL (ref 0.3–1.2)
Total Protein: 7 g/dL (ref 6.5–8.1)

## 2018-06-24 MED ORDER — FULVESTRANT 250 MG/5ML IM SOLN
500.0000 mg | Freq: Once | INTRAMUSCULAR | Status: AC
  Administered 2018-06-24: 500 mg via INTRAMUSCULAR

## 2018-06-24 MED ORDER — FULVESTRANT 250 MG/5ML IM SOLN
INTRAMUSCULAR | Status: AC
  Filled 2018-06-24: qty 5

## 2018-06-24 NOTE — Telephone Encounter (Signed)
Gave avs and calendar ° °

## 2018-07-22 ENCOUNTER — Inpatient Hospital Stay: Payer: Medicare Other

## 2018-07-22 ENCOUNTER — Inpatient Hospital Stay: Payer: Medicare Other | Attending: Oncology

## 2018-07-22 DIAGNOSIS — C50919 Malignant neoplasm of unspecified site of unspecified female breast: Secondary | ICD-10-CM

## 2018-07-22 DIAGNOSIS — C50412 Malignant neoplasm of upper-outer quadrant of left female breast: Secondary | ICD-10-CM

## 2018-07-22 DIAGNOSIS — D0512 Intraductal carcinoma in situ of left breast: Secondary | ICD-10-CM | POA: Insufficient documentation

## 2018-07-22 DIAGNOSIS — C7951 Secondary malignant neoplasm of bone: Secondary | ICD-10-CM | POA: Diagnosis not present

## 2018-07-22 DIAGNOSIS — Z5111 Encounter for antineoplastic chemotherapy: Secondary | ICD-10-CM | POA: Insufficient documentation

## 2018-07-22 DIAGNOSIS — C50912 Malignant neoplasm of unspecified site of left female breast: Secondary | ICD-10-CM

## 2018-07-22 DIAGNOSIS — M810 Age-related osteoporosis without current pathological fracture: Secondary | ICD-10-CM

## 2018-07-22 LAB — CBC WITH DIFFERENTIAL/PLATELET
Abs Immature Granulocytes: 0.01 10*3/uL (ref 0.00–0.07)
BASOS ABS: 0 10*3/uL (ref 0.0–0.1)
Basophils Relative: 1 %
Eosinophils Absolute: 0.2 10*3/uL (ref 0.0–0.5)
Eosinophils Relative: 3 %
HEMATOCRIT: 43.6 % (ref 36.0–46.0)
Hemoglobin: 14.3 g/dL (ref 12.0–15.0)
Immature Granulocytes: 0 %
LYMPHS ABS: 1.2 10*3/uL (ref 0.7–4.0)
LYMPHS PCT: 23 %
MCH: 30.6 pg (ref 26.0–34.0)
MCHC: 32.8 g/dL (ref 30.0–36.0)
MCV: 93.4 fL (ref 80.0–100.0)
Monocytes Absolute: 0.5 10*3/uL (ref 0.1–1.0)
Monocytes Relative: 10 %
Neutro Abs: 3.4 10*3/uL (ref 1.7–7.7)
Neutrophils Relative %: 63 %
Platelets: 240 10*3/uL (ref 150–400)
RBC: 4.67 MIL/uL (ref 3.87–5.11)
RDW: 13.8 % (ref 11.5–15.5)
WBC: 5.4 10*3/uL (ref 4.0–10.5)
nRBC: 0 % (ref 0.0–0.2)

## 2018-07-22 LAB — COMPREHENSIVE METABOLIC PANEL
ALT: 14 U/L (ref 0–44)
AST: 19 U/L (ref 15–41)
Albumin: 3.7 g/dL (ref 3.5–5.0)
Alkaline Phosphatase: 43 U/L (ref 38–126)
Anion gap: 9 (ref 5–15)
BUN: 16 mg/dL (ref 8–23)
CO2: 29 mmol/L (ref 22–32)
Calcium: 10.7 mg/dL — ABNORMAL HIGH (ref 8.9–10.3)
Chloride: 104 mmol/L (ref 98–111)
Creatinine, Ser: 0.72 mg/dL (ref 0.44–1.00)
GFR calc Af Amer: >60 mL/min (ref >60)
GFR calc non Af Amer: >60 mL/min (ref >60)
Glucose, Bld: 85 mg/dL (ref 70–99)
Potassium: 5.1 mmol/L (ref 3.5–5.1)
Sodium: 142 mmol/L (ref 135–145)
Total Bilirubin: 0.3 mg/dL (ref 0.3–1.2)
Total Protein: 7.1 g/dL (ref 6.5–8.1)

## 2018-07-22 MED ORDER — FULVESTRANT 250 MG/5ML IM SOLN
500.0000 mg | Freq: Once | INTRAMUSCULAR | Status: AC
  Administered 2018-07-22: 500 mg via INTRAMUSCULAR

## 2018-07-22 MED ORDER — FULVESTRANT 250 MG/5ML IM SOLN
INTRAMUSCULAR | Status: AC
  Filled 2018-07-22: qty 5

## 2018-07-22 MED ORDER — DENOSUMAB 120 MG/1.7ML ~~LOC~~ SOLN
120.0000 mg | Freq: Once | SUBCUTANEOUS | Status: AC
  Administered 2018-07-22: 120 mg via SUBCUTANEOUS

## 2018-07-22 MED ORDER — DENOSUMAB 120 MG/1.7ML ~~LOC~~ SOLN
SUBCUTANEOUS | Status: AC
  Filled 2018-07-22: qty 1.7

## 2018-08-13 ENCOUNTER — Telehealth: Payer: Self-pay

## 2018-08-13 NOTE — Telephone Encounter (Signed)
Patient called to cancel appointments for 3/26.  Patient does not want to come to facility per CDC guidelines due to age.    Patient receiving monthly Faslodex injections.  Nurse canceled appointments - Pt will call later to follow up for additional rescheduling and future appointments.

## 2018-08-19 ENCOUNTER — Other Ambulatory Visit: Payer: Medicare Other

## 2018-08-19 ENCOUNTER — Ambulatory Visit: Payer: Medicare Other

## 2018-09-13 ENCOUNTER — Inpatient Hospital Stay: Payer: Medicare Other | Attending: Oncology | Admitting: Oncology

## 2018-09-13 DIAGNOSIS — C7951 Secondary malignant neoplasm of bone: Secondary | ICD-10-CM | POA: Diagnosis not present

## 2018-09-13 DIAGNOSIS — Z170 Estrogen receptor positive status [ER+]: Secondary | ICD-10-CM | POA: Diagnosis not present

## 2018-09-13 DIAGNOSIS — C50919 Malignant neoplasm of unspecified site of unspecified female breast: Secondary | ICD-10-CM | POA: Diagnosis not present

## 2018-09-13 DIAGNOSIS — C50412 Malignant neoplasm of upper-outer quadrant of left female breast: Secondary | ICD-10-CM

## 2018-09-13 MED ORDER — TAMOXIFEN CITRATE 20 MG PO TABS: 20.0000 mg | ORAL_TABLET | Freq: Every day | ORAL | 12 refills | Status: DC

## 2018-09-13 NOTE — Progress Notes (Signed)
[facility]  [phone] Fax:(336) [phone]    ID: April Rosales DOB: 01-17-1941  MR#: [id number]  RDE#:[id number]  Patient Care Team: [name], MD as PCP - General (Internal Medicine) Skyeler Smola, [name], MD as Consulting Physician (Oncology) [name], MD as Consulting Physician (Orthopedic Surgery) [name], MD as Consulting Physician (General Surgery) PCP: [name], MD  OTHER: [name], DDS   CHIEF COMPLAINT: Stage IV estrogen receptor positive breast cancer  CURRENT TREATMENT:  [ Fulvestrant, denosumab/Xgeva ]; tamoxifen   I connected with [name] on 09/13/18 at 11:00 AM EDT by telephone visit and verified that I am speaking with the correct person using two identifiers.   I discussed the limitations, risks, security and privacy concerns of performing an evaluation and management service by telemedicine and the availability of in-person appointments. I also discussed with the patient that there may be a patient responsible charge related to this service. The patient expressed understanding and agreed to proceed.   Other persons participating in the visit and their role in the encounter:   - Biomedical scientist, Medical Scribe   Patients location: home  Providers location: [facility]   Chief Complaint: estrogen receptor positive breast cancer    INTERVAL HISTORY: April Rosales was contacted today for follow-up and treatment of her estrogen receptor positive breast cancer.   She has been treated on fulvestrant every 4 weeks, with her most recent dose on 07/22/2018.  Subsequent doses have been held at the patient's request, very reasonably, given the current pandemic  She also would be treated with denosumab/Xgeva every 8 weeks. With her most recent dose on 07/22/2018, we are continuing to hold to avoid unnecessary pandemic exposures.  She will begin tamoxifen starting this week.  Since her last visit  here, she has not undergone any additional studies.    We had set up [facility] for a PET scan and mammography, but these have not been scheduled as they are not considered "urgent."   REVIEW OF SYSTEMS:  April Rosales lives by herself. As part of COVID-19 precautions, she has not left her house. She has not had to go to the grocery store, but her neighbors have been able to pick up a few minor things as needed for her. She continues to walk some for exercise, but she is not getting out for any longer distances or time. The patient denies unusual headaches, visual changes, nausea, vomiting, or dizziness. There has been no unusual cough, phlegm production, or pleurisy. This been no change in bowel or bladder habits. The patient denies unexplained fatigue or unexplained weight loss, bleeding, rash, or fever. A detailed review of systems was otherwise noncontributory.    BREAST CANCER HISTORY: From the original intake note:   April Rosales underwent right lumpectomy in 1985 at [facility] for what sounds like a stage I breast cancer. She tells me she had more than 30 lymph nodes removed from her right axilla and all of them were clear. She received  adjuvant radiation but no systemic treatment.  The patient had recently refused mammography with the last mammogram I can find dating back to August 2010.  More recently the patient presented with left scapular pain radiating down the left arm.  She was evaluated by Dr. [name], who obtained a chest x-ray showing a possible abnormality at T3. He then set up the patient for an MRI of the thoracic spine performed 08/16/2014.  This showed multiple compression fractures  (a  similar picture had been noted on lumbar MRI 10/09/2011 ). However at T3 they noted tumor in the vertebral body extending into the left pedicle and into the lateral epidural space, displacing the cord to the right. There was no evidence of cord compression or cord signal abnormality. There were no  other areas of tumor identified in the thoracic spine.         The patient was then referred to Dr. Hal Neer who on 08/24/2014 set April Rosales up for CT scans of the chest, abdomen and pelvis. There was a dense mass in the upper inner quadrant of the left breast measuring 1.6 cm. There was a 1.2 cm nodule in the minor fissure of the right lung and some evidence of right lung fibrosis at the site of the prior radiation port.  There was also a thyroid mass measuring 2 cm. However there were no parenchymal lung or liver lesions. Incidental meningoceles were noted as well as sclerosis of the fifth and sixth ribs which were felt to be likely posttraumatic.   On 08/29/2014 the patient underwent bilateral diagnostic mammography with tomosynthesis and left breast ultrasonography.  There were postsurgical changes in the upper right breast. In the left breast there was an irregular mass measuring 2.3 cm in the upper inner quadrant. This was palpable. Ultrasound showed this to be hypoechoic and to measure 2.0 cm. There were adjacent areas of nodularity. There was no definite lymphadenopathy in the left axilla.  Biopsy of this breast mass 08/29/2014 showed an invasive adenocarcinoma with both ductal and lobular features (there was strong diffuse E-cadherin expression as well as areas with total absence of E-cadherin expression), with the preliminary prognostic profile showing strong estrogen positivity, very weak to near absent progesterone positivity, an MIB-1 of approximately 40%, and HER-2 equivocal  The patient's subsequent history is as detailed below.   PAST MEDICAL HISTORY: Past Medical History:  Diagnosis Date   Arthritis    Breast cancer ([place]) 1985/2016   takes [name] daily   Chronic back pain    stenosis/listhesis   Family history of ovarian cancer    Osteoporosis    takes Vit D   Radiation 11/23/14-12/11/14   30 Gy T1-T5     PAST SURGICAL HISTORY: Past Surgical History:  Procedure  Laterality Date   ABDOMINAL HYSTERECTOMY     1980   APPENDECTOMY  4580   APPLICATION OF INTRAOPERATIVE CT SCAN N/A 10/20/2014   Procedure: APPLICATION OF INTRAOPERATIVE CAT SCAN;  Surgeon: [name], MD;  Location: [facility];  Service: Neurosurgery;  Laterality: N/A;   BREAST LUMPECTOMY WITH RADIOACTIVE SEED LOCALIZATION Left 05/03/2018   Procedure: LEFT BREAST LUMPECTOMY WITH BRACKETED RADIOACTIVE SEED LOCALIZATION;  Surgeon: [name], MD;  Location: [facility];  Service: General;  Laterality: Left;   BREAST SURGERY Right 1985   THYROID CYST EXCISION  1967    FAMILY HISTORY Family History  Problem Relation Age of Onset   Heart disease Mother    Hypertension Mother    Heart disease Father    Diabetes Father    Breast cancer Paternal Aunt        4 paternal aunts with breast cancer over 39   Prostate cancer Paternal Uncle    Stroke Paternal Grandfather    Ovarian cancer Paternal Aunt    Huntington's disease Other        Nephew, inherited from his father   The patient's father died from a heart attack at the age of 22. He had  9 sisters. 3 of those sisters had breast cancer, all in a menopausal setting. Another sister had ovarian cancer. One of the paternal uncles had cancer of the colon "and back".  The patient's mother died at the age of 60. She was found to have breast cancer shortly before dying , during her final hospitalization.   GYNECOLOGIC HISTORY:  No LMP recorded. Patient has had a hysterectomy.  Menarche age 78, first live birth age 62, the patient is GX P1. She underwent hysterectomy in 1980. She thinks the ovaries were removed, but the CT scan obtained 08/24/2014 showed a definite right ovary. The left ovary may have been removed. She did not take hormone replacement after the hysterectomy.   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 She is divorced, lives alone, with 2 cats. Her son April Rosales lives in  [place] where he works in Engineer, technical sales. He has 4 children of his own. The patient is a Psychologist, forensic.   ADVANCED DIRECTIVES:  In place. The patient has named her sister April Rosales [phone] and her friend April Rosales [phone]) as joint healthcare powers of attorney   HEALTH MAINTENANCE: Social History   Tobacco Use   Smoking status: Former Smoker    Packs/day: 0.50    Years: 10.00    Pack years: 5.00    Types: Cigarettes    Last attempt to quit: 05/03/1970    Years since quitting: 48.3   Smokeless tobacco: Former Systems developer   Tobacco comment: quit smoking 1970's  Substance Use Topics   Alcohol use: Yes    Alcohol/week: 0.0 standard drinks    Comment: Rare   Drug use: No     Colonoscopy: never  PAP: status post hysterectomy  Bone density: 09/12/2016 [facility]/ T score of -4.8 osteoporosis  Lipid panel:  Allergies  Allergen Reactions   Ativan [Lorazepam]     Made her crazy   Dilaudid [Hydromorphone Hcl]     Doesn't want   Haldol [Haloperidol Lactate]     Made her crazy    Current Outpatient Medications  Medication Sig Dispense Refill   Ascorbic Acid (VITAMIN C PO) Take 1 tablet by mouth every other day. Takes qod     aspirin EC 325 MG tablet Take 1 tablet (325 mg total) by mouth daily. 30 tablet 0   Cholecalciferol (VITAMIN D3 ADULT GUMMIES PO) Take 2,000 Units by mouth daily.      Cyanocobalamin (VITAMIN B-12 PO) Take by mouth daily. Takes every other day     Iron-Vitamins (GERITOL PO) Take by mouth daily.     No current facility-administered medications for this visit.     OBJECTIVE: Older white woman in no acute distress  There were no vitals filed for this visit.   There is no height or weight on file to calculate BMI.    ECOG FS:1 - Symptomatic but completely ambulatory   LAB RESULTS:  CMP     Component Value Date/Time   NA 142 07/22/2018 1140   NA 141 05/29/2017 1417   K 5.1 07/22/2018 1140   K 3.6 05/29/2017 1417   CL 104 07/22/2018 1140   CO2  29 07/22/2018 1140   CO2 27 05/29/2017 1417   GLUCOSE 85 07/22/2018 1140   GLUCOSE 111 05/29/2017 1417   BUN 16 07/22/2018 1140   BUN 17.0 05/29/2017 1417   CREATININE 0.72 07/22/2018 1140   CREATININE 0.8 05/29/2017 1417   CALCIUM 10.7 (H) 07/22/2018 1140   CALCIUM 10.7 (H) 06/26/2017 1216  CALCIUM 10.4 05/29/2017 1417   PROT 7.1 07/22/2018 1140   PROT 7.3 05/29/2017 1417   ALBUMIN 3.7 07/22/2018 1140   ALBUMIN 3.9 05/29/2017 1417   AST 19 07/22/2018 1140   AST 17 05/29/2017 1417   ALT 14 07/22/2018 1140   ALT 15 05/29/2017 1417   ALKPHOS 43 07/22/2018 1140   ALKPHOS 42 05/29/2017 1417   BILITOT 0.3 07/22/2018 1140   BILITOT 0.30 05/29/2017 1417   GFRNONAA >60 07/22/2018 1140   GFRNONAA 85 07/07/2016 1125   GFRAA >60 07/22/2018 1140   GFRAA >89 07/07/2016 1125    INo results found for: SPEP, UPEP  Lab Results  Component Value Date   WBC 5.4 07/22/2018   NEUTROABS 3.4 07/22/2018   HGB 14.3 07/22/2018   HCT 43.6 07/22/2018   MCV 93.4 07/22/2018   PLT 240 07/22/2018      Chemistry      Component Value Date/Time   NA 142 07/22/2018 1140   NA 141 05/29/2017 1417   K 5.1 07/22/2018 1140   K 3.6 05/29/2017 1417   CL 104 07/22/2018 1140   CO2 29 07/22/2018 1140   CO2 27 05/29/2017 1417   BUN 16 07/22/2018 1140   BUN 17.0 05/29/2017 1417   CREATININE 0.72 07/22/2018 1140   CREATININE 0.8 05/29/2017 1417      Component Value Date/Time   CALCIUM 10.7 (H) 07/22/2018 1140   CALCIUM 10.7 (H) 06/26/2017 1216   CALCIUM 10.4 05/29/2017 1417   ALKPHOS 43 07/22/2018 1140   ALKPHOS 42 05/29/2017 1417   AST 19 07/22/2018 1140   AST 17 05/29/2017 1417   ALT 14 07/22/2018 1140   ALT 15 05/29/2017 1417   BILITOT 0.3 07/22/2018 1140   BILITOT 0.30 05/29/2017 1417       Lab Results  Component Value Date   LABCA2 24 01/01/2016    No components found for: ULAGT364  No results for input(s): INR in the last 168 hours.  Urinalysis    Component Value Date/Time    COLORURINE YELLOW 11/25/2017 1143   APPEARANCEUR CLEAR 11/25/2017 1143   LABSPEC 1.016 11/25/2017 1143   PHURINE 7.0 11/25/2017 1143   GLUCOSEU NEGATIVE 11/25/2017 1143   HGBUR NEGATIVE 11/25/2017 1143   BILIRUBINUR NEGATIVE 09/19/2015 1239   KETONESUR NEGATIVE 11/25/2017 1143   PROTEINUR NEGATIVE 11/25/2017 1143   NITRITE NEGATIVE 11/25/2017 1143   LEUKOCYTESUR 1+ (A) 11/25/2017 1143    STUDIES: Mammography and PET scan pending  ASSESSMENT: 78 y.o. [place] woman with stage IV left breast cancer involving bone  (1) status post right lumpectomy and axillary node dissection in 1985 followed by radiation at [facility] 08/16/2014: measurable disease in spine, lung and left breast   (2) evaluation for left shoulder pain led to thoracic spine MRI 08/16/2014 showing a pathologic fracture at T3 with epidural tumor displacing the cord to the right, but no cord compression. CT scans of the chest, abdomen and pelvis 08/24/2014 showed in addition a mass in the upper outer quadrant left breast measuring 1.6 cm and a nodule in the minor fissure of the right lung measuring 1.2 cm, but no parenchymal lung or liver lesions.   (a) CA 27-29 was noninformative at 38 (09/20/2014)    (3) mammography and ultrasonography 08/29/2014 show a mass in the upper inner left breast which was palpable,  measuring 2.0 cm by ultrasound. Biopsy of this mass 08/29/2014 showed an invasive breast cancer with both lobular and ductal features, estrogen receptor positive, progesterone  receptor weakly positive, with an MIB-1 in the 40% range, HER-2 equivocal (6 else ratio 1.5, but average number her nucleus 5.8)   (4) letrozole started 08/31/2014;   (a) palbociclib added Sept 2016 at 75 mg 21/7, with significant neutropenia; not repeated after first cycle  (b) letrozole discontinued 05/29/2017 with evidence of disease progression in the breast   (5)  zolendronate started 09/20/2014, stopped after initial dose due  to poor tolerance  (a) denosumab/ Xgeva started 01/31/2015, repeated every 4 weeks  (b) changed to every 8 weeks as of August 2018.    (6) on 10/20/2014 the patient underwent T2-T3 and T4 decompressive laminectomy with removal of epidural tumor, C7-T4 segmental pedicle screw instrumentation with virage screw system with arrow guidance protocol and C7-T4 posterolateral fusion. The cells were positive for the estrogen receptor. HER-2/neu testing by [facility] showed again equivocal results,  (a) most recent bone scan 10/01/2016 finds no new lesions only postoperative changes  (7) radiation 11/23/2014-12/11/2014.  (a) T1-T5 was treated to 30 Gy in 12 fractions at 2.5 Gy per fraction   (8) initiated close follow-up while considering eventual left lumpectomy or mastectomy depending on  longer-term results of systemic therapy  (a) most recent left breast ultrasonography 10/30/2016 found this mass to measure 0.7 cm  (b) repeat left breast ultrasonography 05/13/2017 showed the upper outer quadrant mass to have grown to 1.4 cm  (c) left breast ultrasound 09/08/2017 shows the mass to now measure 0.9 cm  (d) left breast biopsy x2 on 12/11/2017 shows high-grade ductal carcinoma in situ, essentially estrogen receptor negative  (9) genetics testing using the Breast/Ovarian Cancer Panel through [facility] [name], MD) found no deleterious mutations in ATM, BARD1, BRCA1, BRCA2, BRIP1, CDH1, CHEK2, EPCAM, FANCC, MLH1, MSH2, MSH6, NBN, PALB2, PMS2, PTEN, RAD51C, RAD51D, STK11, TP53, or XRCC2    (10) right thyroid nodule is a complex cyst as noted on CT scan of the neck 01/29/2016  (11) osteoporosis: [facility] density at [facility] 09/12/2016 showed a T score of -4.8.  (a) continue denosumab/Xgeva as above  (12) fulvestrant started 05/29/2017  (a) started palbociclib 06/29/2017 at 75 mg every other day for 21 days on, 7 days off  (b) palbociclib discontinued on 3/29 due to progressive fatigue and patient preference  (13)   was to start abemaciclib 02/04/2018, but patient opted for going back to palbociclib at a lower dose beginning in November 19, patient subsequently declined s palbociclib, and  proceeded to surgery  (14) status post left lumpectomy 05/03/2018 showing a pT1b pNX invasive ductal carcinoma, grade 2, with equivocal HER-2 results  (a) opted against adjuvant radiation given presence of stage IV disease  (15) tamoxifen started 09/13/2018 as a "bridge" pending resumption of fulvestrant/denosumab  PLAN: April Rosales is doing a very good job of social distancing and basically staying at home.  She is correct that coming here for treatment would expose her to the virus and I do not think she would make it if she developed that infection  Accordingly she will continue to stay at home.  Since we have not treated her now for 2 months, I think it would be prudent to start on antiestrogens.  We discussed tamoxifen and she has a good understanding of the possible toxicities, side effects and complications of this agent.  Since she is status post hysterectomy, she is a good candidate for this.  The risk of blood clots is low but not 0 and she will contact us if she has any concerns regarding symptoms from  that.  Tamoxifen of course will also help with her bones  Tentatively I am setting her up to see me November 08, 2018, and hopefully we can get her PET scan and mammography done before that visit.  She knows to call for any other issues that may develop before then.   [name]. Liat Mayol, MD  09/13/18 9:48 AM Medical Oncology and Hematology [facility] [address] [place], [place] [address] Tel. [phone]    Fax. [phone]  I, Jacqualyn Posey am acting as a Education administrator for Chauncey Cruel, MD.   I, Lurline Del MD, have reviewed the above documentation for accuracy and completeness, and I agree with the above.

## 2018-09-14 ENCOUNTER — Telehealth: Payer: Self-pay | Admitting: *Deleted

## 2018-09-14 NOTE — Telephone Encounter (Signed)
Called and spoke with the patient regarding the change in her appts. Appt on 4/23 cancelled and moved to 6/11. Patient given new appt date and times

## 2018-09-15 ENCOUNTER — Encounter (HOSPITAL_COMMUNITY): Payer: Medicare Other

## 2018-09-16 ENCOUNTER — Other Ambulatory Visit: Payer: Medicare Other

## 2018-09-16 ENCOUNTER — Ambulatory Visit: Payer: Medicare Other | Admitting: Oncology

## 2018-09-16 ENCOUNTER — Ambulatory Visit: Payer: Medicare Other

## 2018-11-02 ENCOUNTER — Ambulatory Visit (HOSPITAL_COMMUNITY)
Admission: RE | Admit: 2018-11-02 | Discharge: 2018-11-02 | Disposition: A | Discharge status: Home / Self Care | Payer: Medicare Other | Source: Ambulatory Visit | Attending: Oncology | Admitting: Oncology

## 2018-11-02 ENCOUNTER — Other Ambulatory Visit: Payer: Self-pay

## 2018-11-02 DIAGNOSIS — I700 Atherosclerosis of aorta: Secondary | ICD-10-CM | POA: Diagnosis not present

## 2018-11-02 DIAGNOSIS — J929 Pleural plaque without asbestos: Secondary | ICD-10-CM | POA: Insufficient documentation

## 2018-11-02 DIAGNOSIS — Z79899 Other long term (current) drug therapy: Secondary | ICD-10-CM | POA: Insufficient documentation

## 2018-11-02 DIAGNOSIS — Z853 Personal history of malignant neoplasm of breast: Secondary | ICD-10-CM | POA: Diagnosis not present

## 2018-11-02 DIAGNOSIS — K8020 Calculus of gallbladder without cholecystitis without obstruction: Secondary | ICD-10-CM | POA: Insufficient documentation

## 2018-11-02 DIAGNOSIS — C7951 Secondary malignant neoplasm of bone: Secondary | ICD-10-CM | POA: Insufficient documentation

## 2018-11-02 LAB — GLUCOSE, CAPILLARY: Glucose-Capillary: 102 mg/dL — ABNORMAL HIGH (ref 70–99)

## 2018-11-02 MED ORDER — FLUDEOXYGLUCOSE F - 18 (FDG) INJECTION
6.1200 | Freq: Once | INTRAVENOUS | Status: AC | PRN
  Administered 2018-11-02: 6.12 via INTRAVENOUS

## 2018-11-03 ENCOUNTER — Other Ambulatory Visit: Payer: Self-pay | Admitting: Oncology

## 2018-11-03 ENCOUNTER — Telehealth: Payer: Self-pay | Admitting: Oncology

## 2018-11-03 NOTE — Telephone Encounter (Signed)
Called patient regarding upcoming [facility] appointments, per providers request appointments in June will be cancelled. Patient would like to speak with provider first before rescheduling with a new date.  Message to provider.

## 2018-11-04 ENCOUNTER — Other Ambulatory Visit: Payer: Medicare Other

## 2018-11-04 ENCOUNTER — Ambulatory Visit: Payer: Medicare Other

## 2018-11-04 ENCOUNTER — Telehealth: Payer: Self-pay

## 2018-11-04 ENCOUNTER — Ambulatory Visit: Payer: Self-pay

## 2018-11-04 ENCOUNTER — Ambulatory Visit: Payer: Medicare Other | Admitting: Oncology

## 2018-11-04 NOTE — Telephone Encounter (Signed)
RN spoke with patient.  Patient requesting review of PET scan.  RN reviewed scan after MD reviewed.  Pt voiced understanding and great appreciation.

## 2018-11-05 ENCOUNTER — Telehealth: Payer: Self-pay | Admitting: Oncology

## 2018-11-05 NOTE — Telephone Encounter (Signed)
I talk with patient regarding schedule  

## 2018-11-08 ENCOUNTER — Other Ambulatory Visit: Payer: Medicare Other

## 2018-11-08 ENCOUNTER — Ambulatory Visit: Payer: Medicare Other | Admitting: Oncology

## 2018-11-08 ENCOUNTER — Ambulatory Visit: Payer: Medicare Other

## 2018-12-24 ENCOUNTER — Encounter: Payer: Self-pay | Admitting: Internal Medicine

## 2018-12-28 ENCOUNTER — Other Ambulatory Visit: Payer: Self-pay | Admitting: *Deleted

## 2018-12-28 ENCOUNTER — Telehealth: Payer: Self-pay | Admitting: *Deleted

## 2018-12-28 DIAGNOSIS — K625 Hemorrhage of anus and rectum: Secondary | ICD-10-CM

## 2018-12-28 DIAGNOSIS — C50919 Malignant neoplasm of unspecified site of unspecified female breast: Secondary | ICD-10-CM

## 2018-12-28 NOTE — Telephone Encounter (Signed)
This RN returned VM left by pt stating concern for onset of " bleeding from my rectum " .  April Rosales states above started approximately 2-3 weeks ago.  She states it occurs " in the morning only when I have a BM "  If she has a second BM later in the day she does not have any blood noted.  She states bright blood on tissue with wiping but denies any spotting or active bleeding without BM. She does not have any bleeding on underclothes or with urination.  Stools are formed but dark ( which is new ) - denies any coffee ground or gelatinous appearance.  April Rosales confirmed that she is on MVT daily with iron " but this is the same one I have been taking for years "  She denies any light headedness or changes in sensorium.  This RN informed pt above would be given to MD for further review and recommendations.  Return call number given as [phone].

## 2018-12-29 ENCOUNTER — Other Ambulatory Visit: Payer: Self-pay | Admitting: Oncology

## 2018-12-29 ENCOUNTER — Inpatient Hospital Stay: Payer: Medicare Other | Attending: Oncology

## 2018-12-29 ENCOUNTER — Other Ambulatory Visit: Payer: Self-pay

## 2018-12-29 DIAGNOSIS — C50919 Malignant neoplasm of unspecified site of unspecified female breast: Secondary | ICD-10-CM

## 2018-12-29 DIAGNOSIS — Z79899 Other long term (current) drug therapy: Secondary | ICD-10-CM | POA: Diagnosis not present

## 2018-12-29 DIAGNOSIS — C7951 Secondary malignant neoplasm of bone: Secondary | ICD-10-CM | POA: Insufficient documentation

## 2018-12-29 DIAGNOSIS — C50412 Malignant neoplasm of upper-outer quadrant of left female breast: Secondary | ICD-10-CM

## 2018-12-29 DIAGNOSIS — D0512 Intraductal carcinoma in situ of left breast: Secondary | ICD-10-CM | POA: Insufficient documentation

## 2018-12-29 LAB — CBC WITH DIFFERENTIAL/PLATELET
Abs Immature Granulocytes: 0.01 10*3/uL (ref 0.00–0.07)
Basophils Absolute: 0.1 10*3/uL (ref 0.0–0.1)
Basophils Relative: 1 %
Eosinophils Absolute: 0.2 10*3/uL (ref 0.0–0.5)
Eosinophils Relative: 4 %
HCT: 44.3 % (ref 36.0–46.0)
Hemoglobin: 14.9 g/dL (ref 12.0–15.0)
Immature Granulocytes: 0 %
Lymphocytes Relative: 21 %
Lymphs Abs: 1.1 10*3/uL (ref 0.7–4.0)
MCH: 31.2 pg (ref 26.0–34.0)
MCHC: 33.6 g/dL (ref 30.0–36.0)
MCV: 92.7 fL (ref 80.0–100.0)
Monocytes Absolute: 0.5 10*3/uL (ref 0.1–1.0)
Monocytes Relative: 9 %
Neutro Abs: 3.5 10*3/uL (ref 1.7–7.7)
Neutrophils Relative %: 65 %
Platelets: 246 10*3/uL (ref 150–400)
RBC: 4.78 MIL/uL (ref 3.87–5.11)
RDW: 13.5 % (ref 11.5–15.5)
WBC: 5.4 10*3/uL (ref 4.0–10.5)
nRBC: 0 % (ref 0.0–0.2)

## 2018-12-29 LAB — COMPREHENSIVE METABOLIC PANEL
ALT: 16 U/L (ref 0–44)
AST: 19 U/L (ref 15–41)
Albumin: 3.8 g/dL (ref 3.5–5.0)
Alkaline Phosphatase: 38 U/L (ref 38–126)
Anion gap: 11 (ref 5–15)
BUN: 15 mg/dL (ref 8–23)
CO2: 26 mmol/L (ref 22–32)
Calcium: 10.5 mg/dL — ABNORMAL HIGH (ref 8.9–10.3)
Chloride: 106 mmol/L (ref 98–111)
Creatinine, Ser: 0.75 mg/dL (ref 0.44–1.00)
GFR calc Af Amer: >60 mL/min (ref >60)
GFR calc non Af Amer: >60 mL/min (ref >60)
Glucose, Bld: 113 mg/dL — ABNORMAL HIGH (ref 70–99)
Potassium: 5 mmol/L (ref 3.5–5.1)
Sodium: 143 mmol/L (ref 135–145)
Total Bilirubin: 0.3 mg/dL (ref 0.3–1.2)
Total Protein: 7 g/dL (ref 6.5–8.1)

## 2018-12-29 NOTE — Progress Notes (Signed)
I called Auset and gave her the good news on her hemoglobin.  She was reassured.

## 2018-12-30 LAB — CANCER ANTIGEN 27.29: CA 27.29: 23.7 U/mL (ref 0.0–38.6)

## 2019-02-01 IMAGING — MG STEREOTACTIC CORE NEEDLE BIOPSY
8 of 12 series · 8 of 20 positions shown · non-contrast
Comparison: Previous exams.

Addendum:
CLINICAL DATA: Patient with indeterminate calcifications in the
LEFT breast, 2 adjacent sites within the upper inner quadrant, for
which patient presents today for stereotactic biopsies.

Known LEFT breast cancer for which patient is being treated with
hormonal therapy.
Remote history of RIGHT breast cancer status post breast
conservation surgery and radiation treatment.
EXAM:
LEFT BREAST STEREOTACTIC CORE NEEDLE BIOPSY x2

[L (1 of 8)]
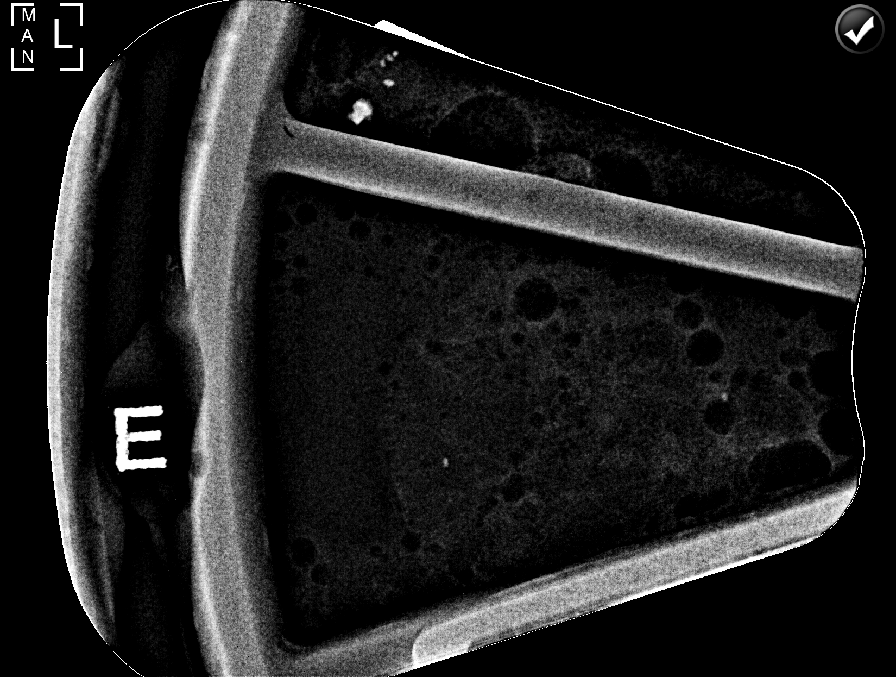

[L (2 of 8)]
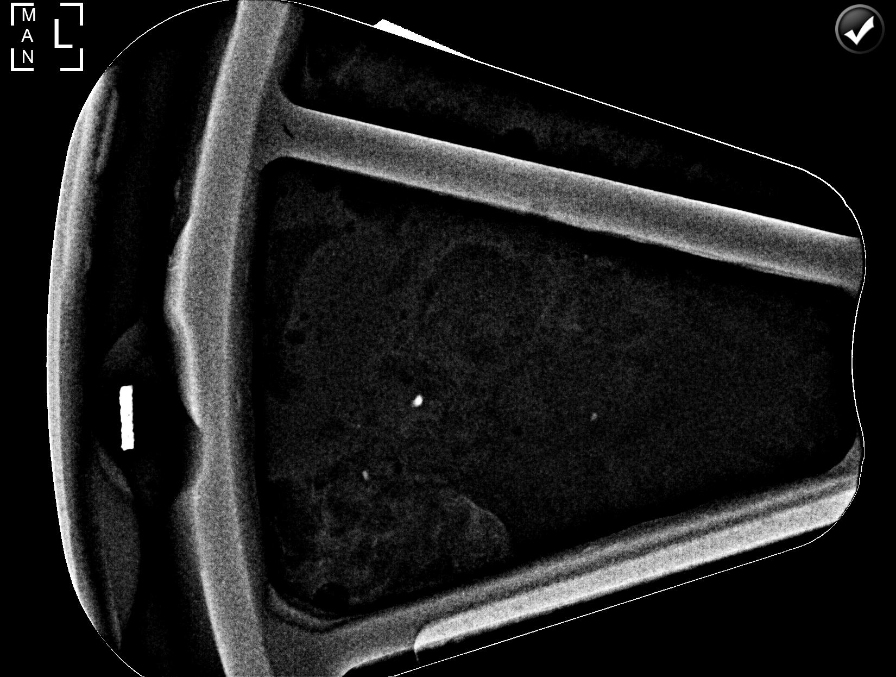

[L (3 of 8)]
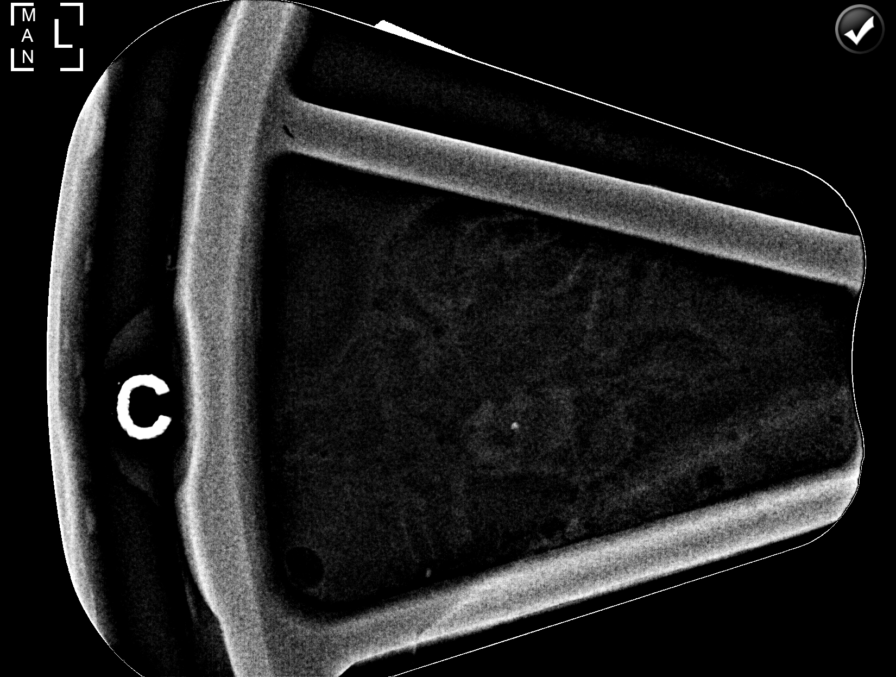

[L (4 of 8)]
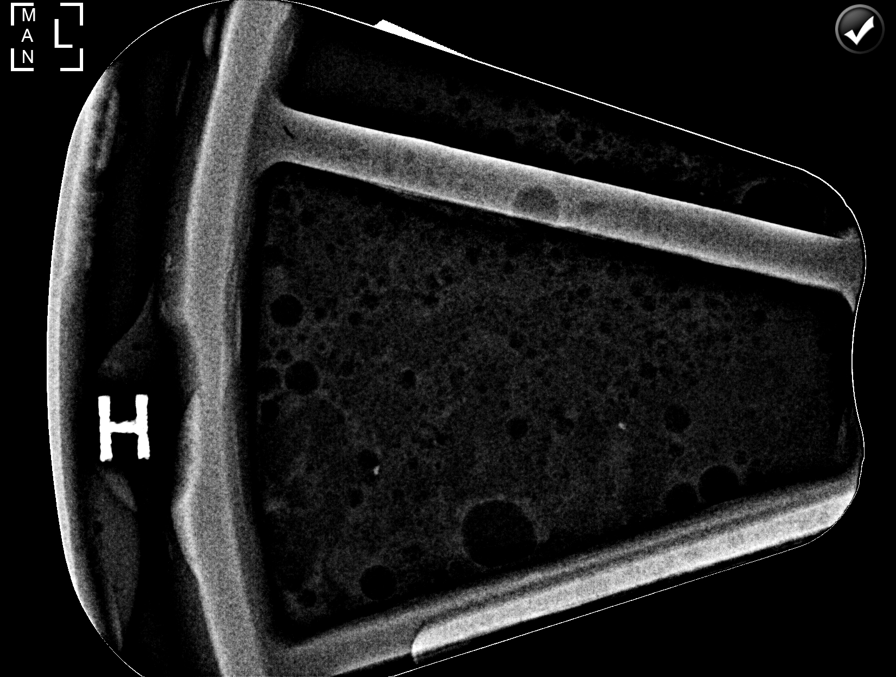

[L (5 of 8)]
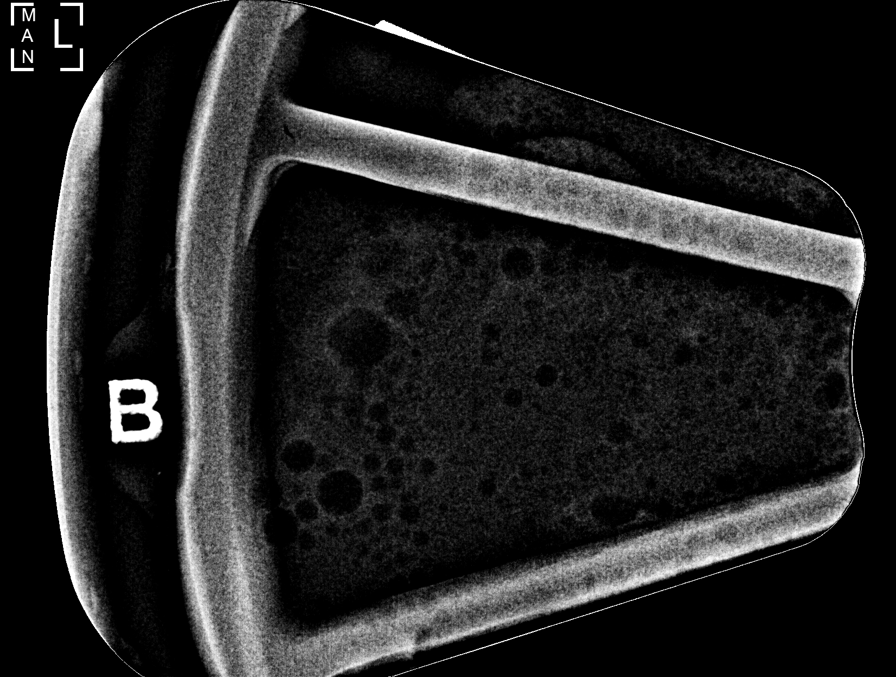

[L (6 of 8)]
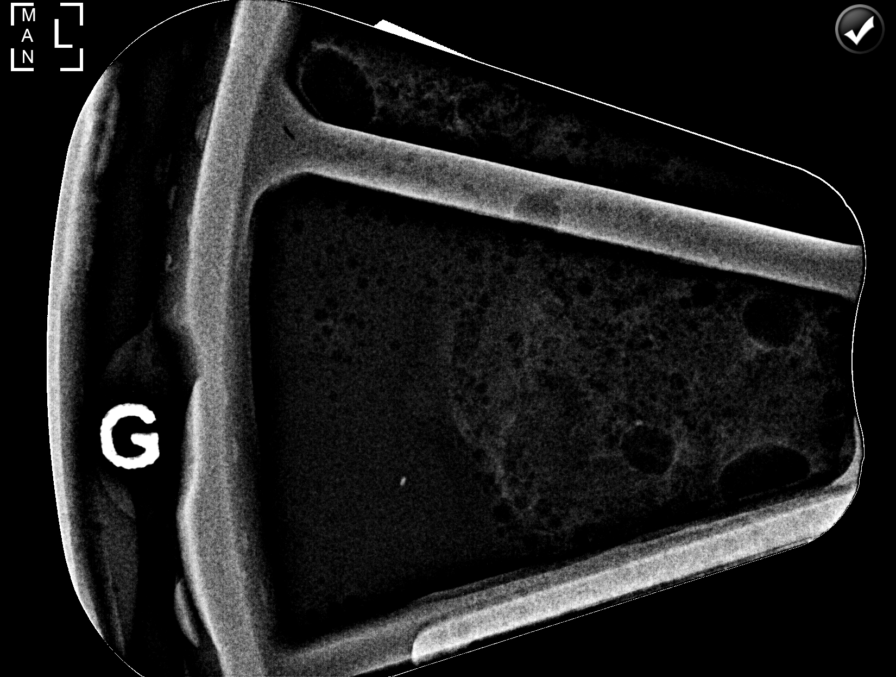

[L (7 of 8)]
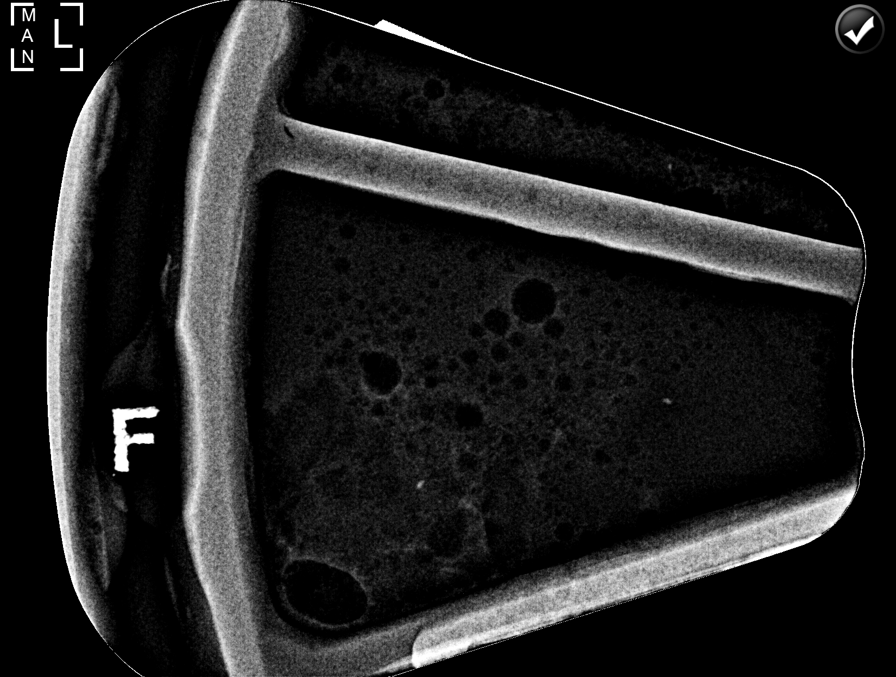

[L (8 of 8)]
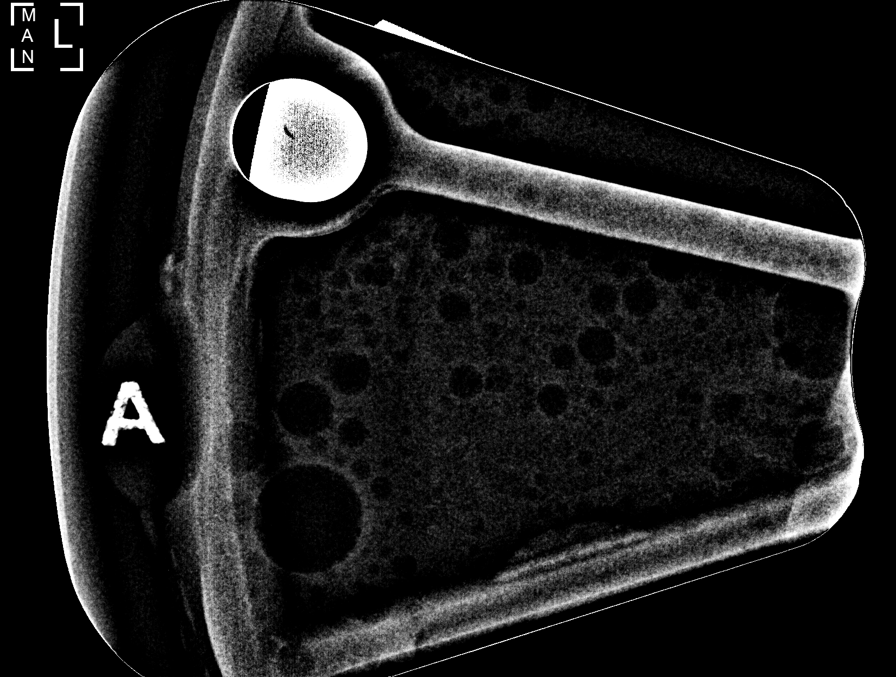

[8 of 20 positions shown; findings below may reference images not displayed]



Site 1: Using sterile technique and 1% Lidocaine as local
anesthetic, under stereotactic guidance, a 9 gauge vacuum assisted
device was used to perform core needle biopsy of calcifications in
the upper inner quadrant of the left breast, the more lateral group
of calcifications, using a superior approach. Specimen radiograph
was performed showing calcifications. Specimens with calcifications
are identified for pathology.

Lesion quadrant: Upper inner quadrant

At the conclusion of the procedure, a coil shaped tissue marker clip
was deployed into the biopsy cavity.

Site 1: Using sterile technique and 1% Lidocaine as local
anesthetic, under stereotactic guidance, a 9 gauge vacuum assisted
device was used to perform core needle biopsy of calcifications in
the upper inner quadrant of the left breast, the more medial group
of calcifications, using a superior approach. Specimen radiograph
was performed showing calcifications. Specimens with calcifications
are identified for pathology.

Lesion quadrant: Upper inner quadrant

At the conclusion of the procedure, a X shaped tissue marker clip
was deployed into the biopsy cavity. Follow-up 2-view mammogram was
performed and dictated separately.
IMPRESSION: 1. Stereotactic biopsy of indeterminate calcifications within the
upper inner quadrant of the LEFT breast, the more lateral group of
calcifications. Coil shaped clip placed at the biopsy site.
2. Stereotactic biopsy of indeterminate calcifications within the
upper inner quadrant of the LEFT breast, the more medial group of
calcifications. X shaped clip placed at the biopsy site.

.

ADDENDUM:
Pathology revealed HIGH NUCLEAR GRADE DUCTAL CARCINOMA IN SITU
(DCIS) WITH NECROSIS of the Left breast, both locations, upper inner
quadrant, the more lateral group and upper inner quadrant, the more
medial group. This was found to be concordant by Dr. Onel Lecroy.

Pathology results were discussed with the patient by telephone. The
patient reported doing well after the biopsies with tenderness and
itching at the sites. Post biopsy instructions and care were
reviewed and questions were answered. The patient was encouraged to
call The [REDACTED] for any additional
concerns.

The patient has a recent diagnosis of left breast cancer and should
follow her outlined treatment plan. Dr. Libia Kroeger at [HOSPITAL]
[HOSPITAL] was notified of biopsy results via [REDACTED] message on
December 14, 2017.

Pathology results reported by [employer], RN on 12/15/2017.

ADDENDUM:
The case was discussed with Dr. Ebadat to facilitate surgical
planning. The patient wishes to have a left breast lumpectomy. After
reviewing the images, we have suggested presurgical bracketed seed
localization, with 1 seed placed at the ribbon shaped marker,
associated with prior biopsy-proven invasive carcinoma, and 1 seed
placed between the 2 post biopsy markers, X and coil shaped,
associated with prior biopsy-proven high-grade DCIS.

*** End of Addendum ***

## 2019-02-01 IMAGING — MG MM BREAST BX W LOC DEV EA AD LESION IMG BX SPEC STEREO GUIDE*L*
8 of 13 series · 8 of 17 positions shown · non-contrast
Comparison: Previous exams.

Addendum:
CLINICAL DATA: Patient with indeterminate calcifications in the
LEFT breast, 2 adjacent sites within the upper inner quadrant, for
which patient presents today for stereotactic biopsies.

Known LEFT breast cancer for which patient is being treated with
hormonal therapy.
Remote history of RIGHT breast cancer status post breast
conservation surgery and radiation treatment.
EXAM:
LEFT BREAST STEREOTACTIC CORE NEEDLE BIOPSY x2

[L (1 of 8)]
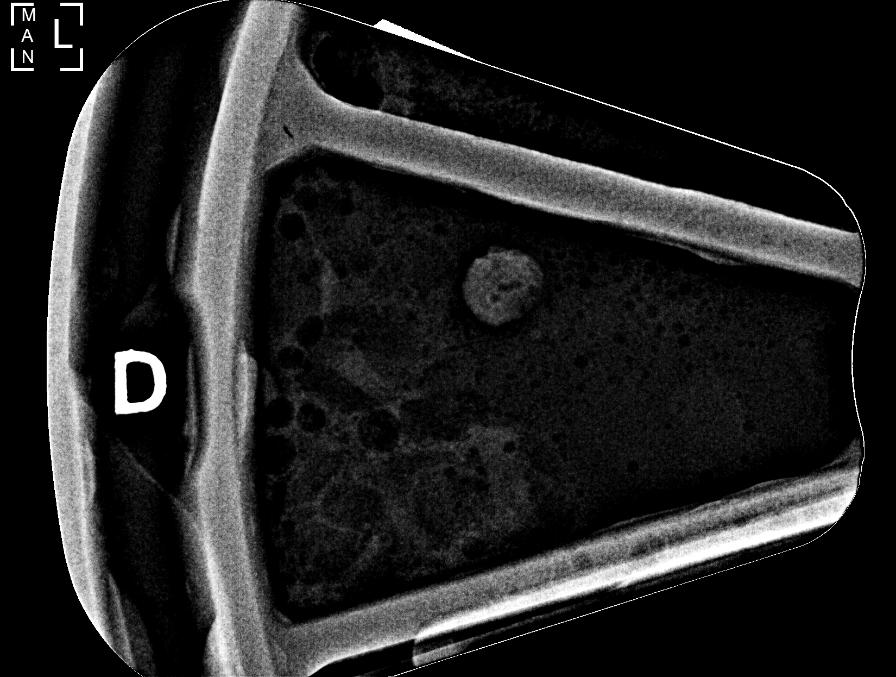

[L (2 of 8)]
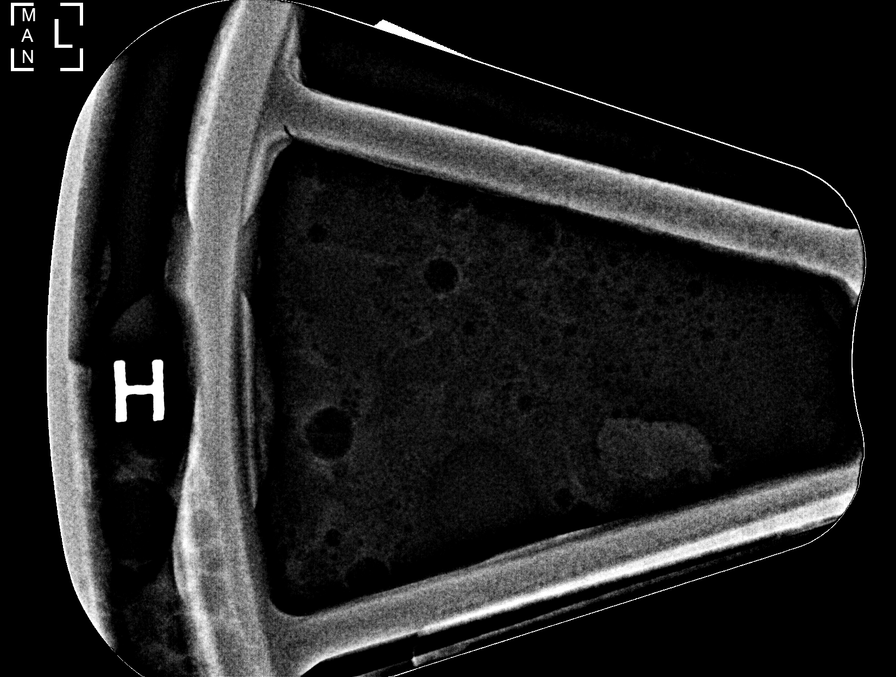

[L (3 of 8)]
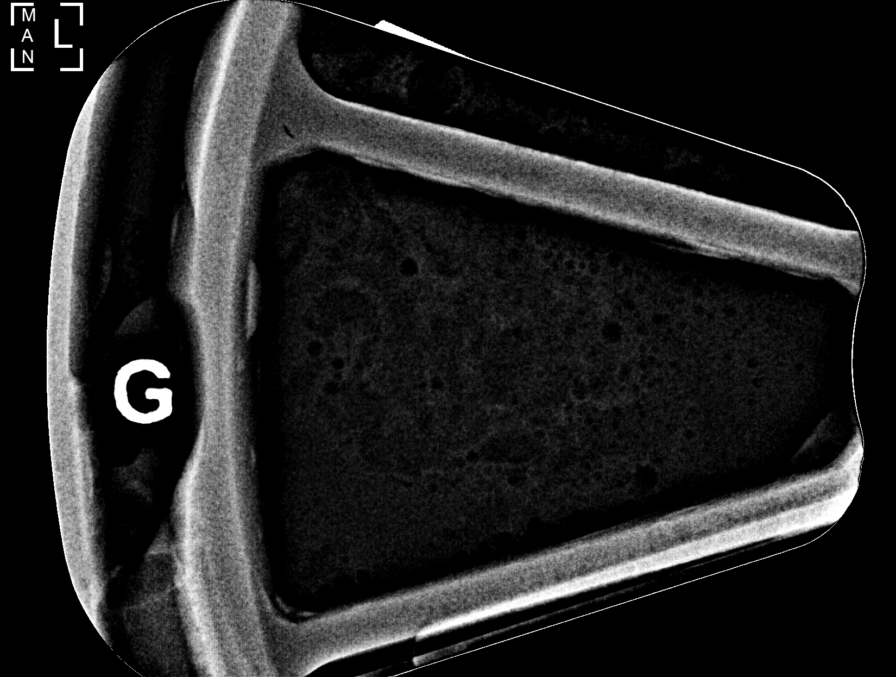

[L (4 of 8)]
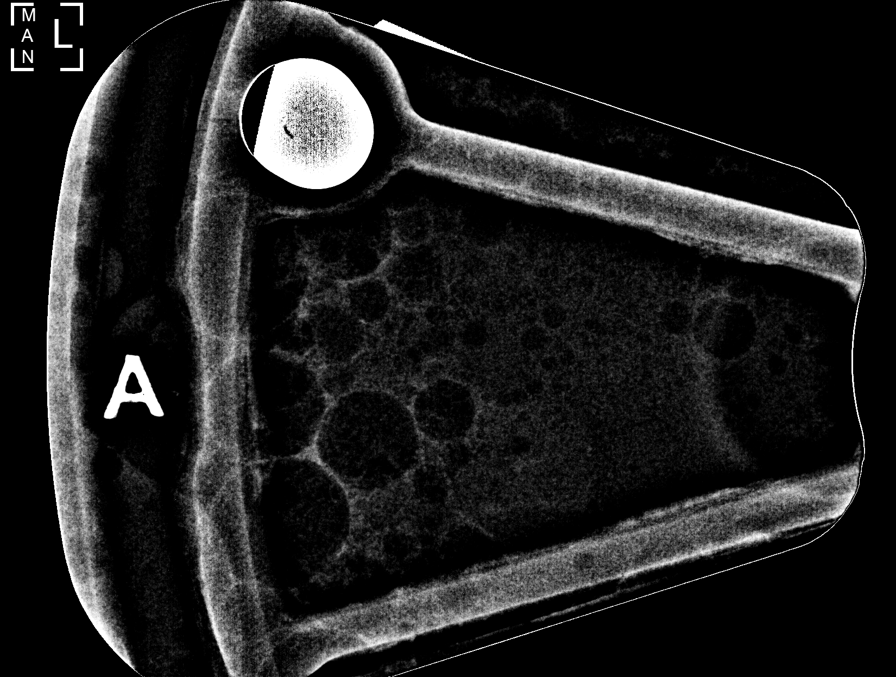

[L (5 of 8)]
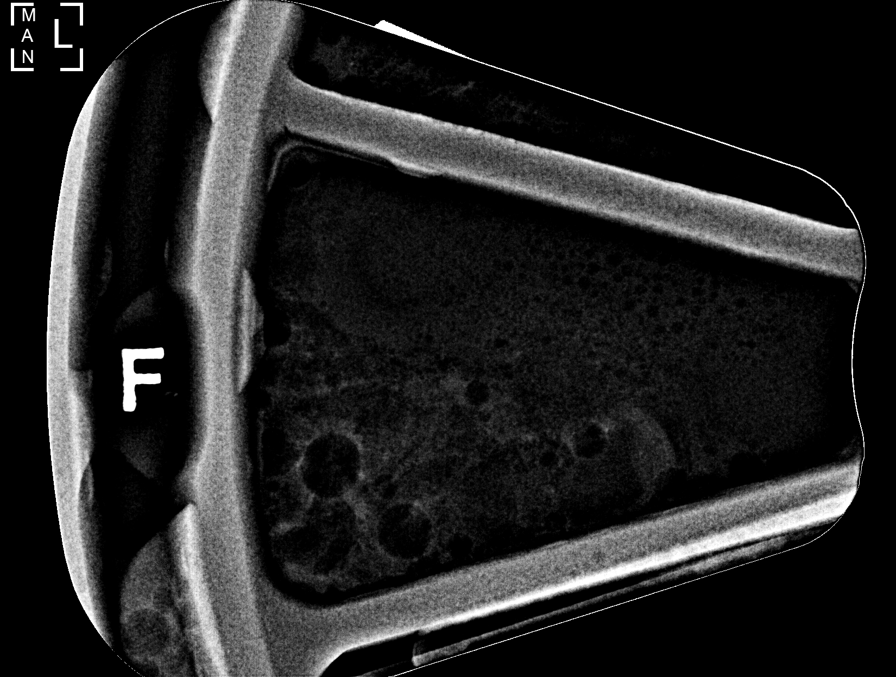

[L (6 of 8)]
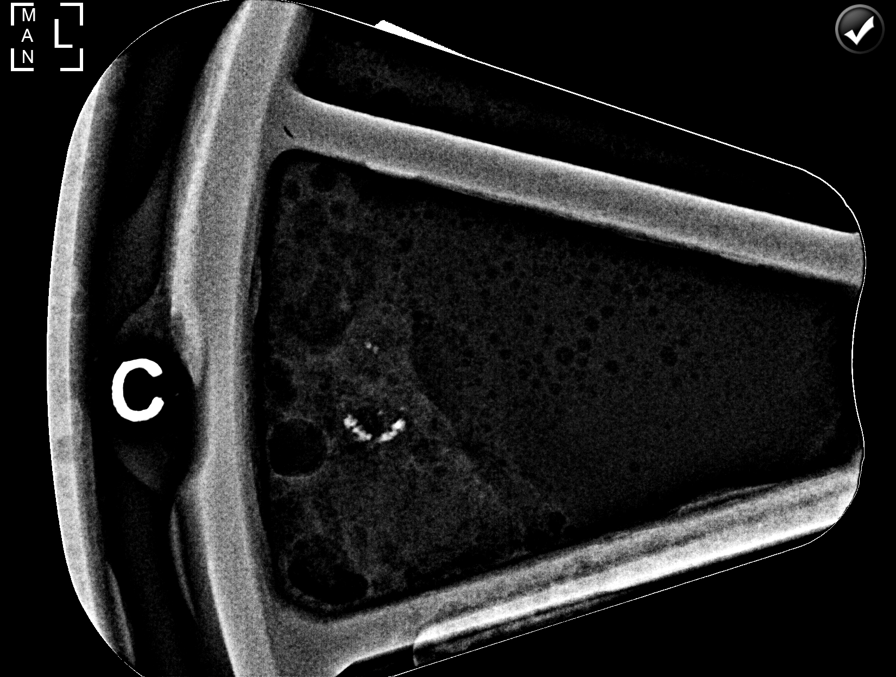

[L (7 of 8)]
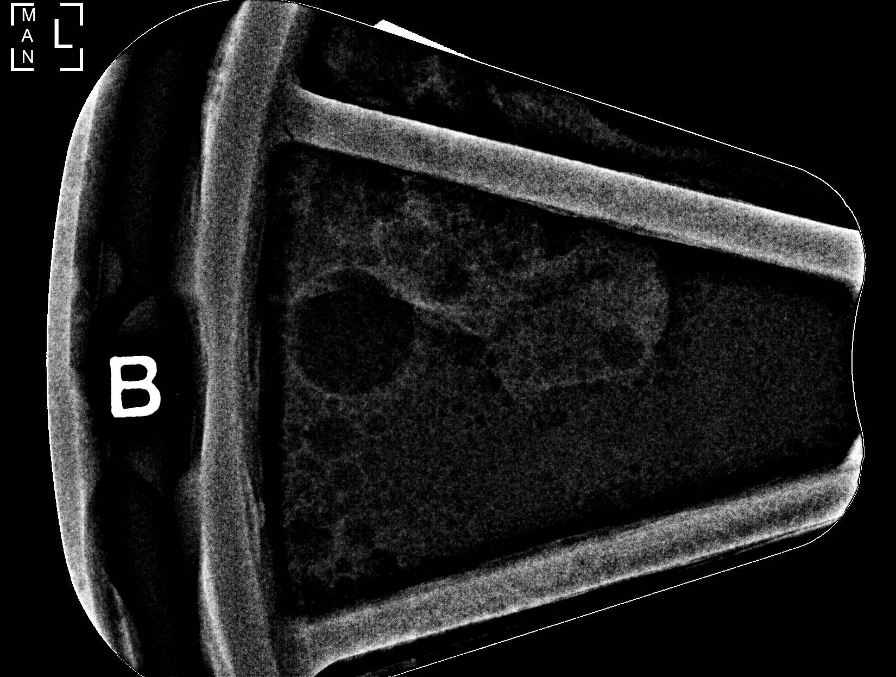

[L (8 of 8)]
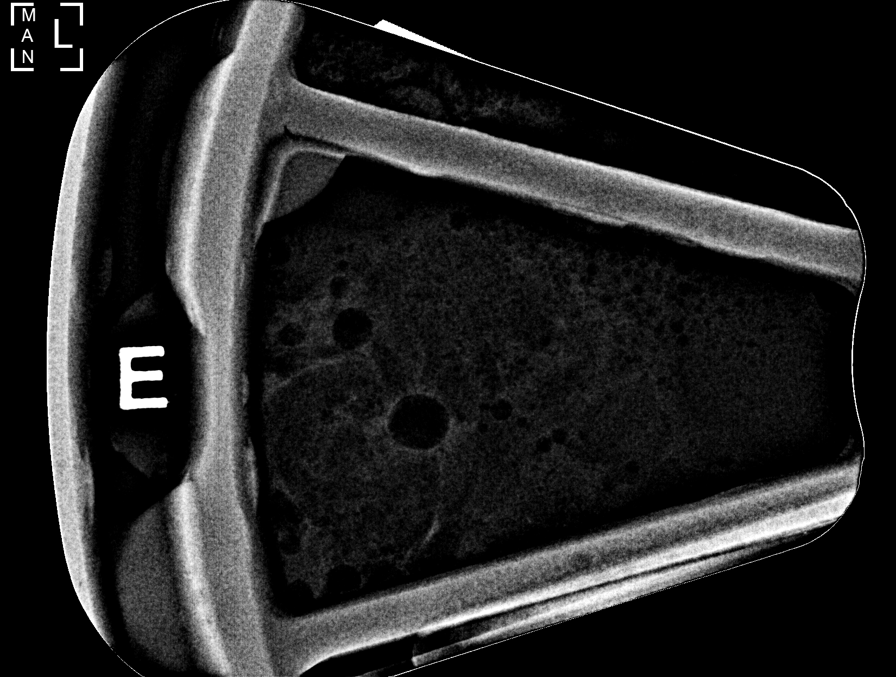

[8 of 17 positions shown; findings below may reference images not displayed]



Site 1: Using sterile technique and 1% Lidocaine as local
anesthetic, under stereotactic guidance, a 9 gauge vacuum assisted
device was used to perform core needle biopsy of calcifications in
the upper inner quadrant of the left breast, the more lateral group
of calcifications, using a superior approach. Specimen radiograph
was performed showing calcifications. Specimens with calcifications
are identified for pathology.

Lesion quadrant: Upper inner quadrant

At the conclusion of the procedure, a coil shaped tissue marker clip
was deployed into the biopsy cavity.

Site 1: Using sterile technique and 1% Lidocaine as local
anesthetic, under stereotactic guidance, a 9 gauge vacuum assisted
device was used to perform core needle biopsy of calcifications in
the upper inner quadrant of the left breast, the more medial group
of calcifications, using a superior approach. Specimen radiograph
was performed showing calcifications. Specimens with calcifications
are identified for pathology.

Lesion quadrant: Upper inner quadrant

At the conclusion of the procedure, a X shaped tissue marker clip
was deployed into the biopsy cavity. Follow-up 2-view mammogram was
performed and dictated separately.
IMPRESSION: 1. Stereotactic biopsy of indeterminate calcifications within the
upper inner quadrant of the LEFT breast, the more lateral group of
calcifications. Coil shaped clip placed at the biopsy site.
2. Stereotactic biopsy of indeterminate calcifications within the
upper inner quadrant of the LEFT breast, the more medial group of
calcifications. X shaped clip placed at the biopsy site.

.

ADDENDUM:
Pathology revealed HIGH NUCLEAR GRADE DUCTAL CARCINOMA IN SITU
(DCIS) WITH NECROSIS of the Left breast, both locations, upper inner
quadrant, the more lateral group and upper inner quadrant, the more
medial group. This was found to be concordant by Dr. Onel Lecroy.

Pathology results were discussed with the patient by telephone. The
patient reported doing well after the biopsies with tenderness and
itching at the sites. Post biopsy instructions and care were
reviewed and questions were answered. The patient was encouraged to
call The [REDACTED] for any additional
concerns.

The patient has a recent diagnosis of left breast cancer and should
follow her outlined treatment plan. Dr. Libia Kroeger at [HOSPITAL]
[HOSPITAL] was notified of biopsy results via [REDACTED] message on
December 14, 2017.

Pathology results reported by [employer], RN on 12/15/2017.

ADDENDUM:
The case was discussed with Dr. Ebadat to facilitate surgical
planning. The patient wishes to have a left breast lumpectomy. After
reviewing the images, we have suggested presurgical bracketed seed
localization, with 1 seed placed at the ribbon shaped marker,
associated with prior biopsy-proven invasive carcinoma, and 1 seed
placed between the 2 post biopsy markers, X and coil shaped,
associated with prior biopsy-proven high-grade DCIS.

*** End of Addendum ***

## 2019-02-07 ENCOUNTER — Encounter: Payer: Self-pay | Admitting: Internal Medicine

## 2019-02-07 NOTE — Progress Notes (Signed)
Comprehensive Evaluation &  Examination     This very nice 78 y.o. WWF presents for a  comprehensive evaluation and management of multiple medical co-morbidities.  Patient has been followed for labile HTN, HLD, Prediabetes  and Vitamin D Deficiency.     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 She has since been followed by Dr Jana Hakim. She was treated with Surgery & Radiation to the T-spine tumour and has been closely followed by Dr Jana Hakim on Chemotherapy (last was in Feb)  and more recently added Tamoxifen. PET Scan in June was Negative !         Patient has been monitored for years for labile HTN. Patient's BP has been controlled at home and patient denies any cardiac symptoms as chest pain, palpitations, shortness of breath, dizziness or ankle swelling. Today's BP is at goal - 140/80.      Patient's hyperlipidemia is not controlled with diet and she has been reticent to take meds for Chol.  Last lipids were not at goal:  Lab Results  Component Value Date   CHOL 184 11/25/2017   HDL 55 11/25/2017   LDLCALC 109 (H) 11/25/2017   TRIG 100 11/25/2017   CHOLHDL 3.3 11/25/2017      Patient has hx/o elevated glucoses and patient denies reactive hypoglycemic symptoms, visual blurring, diabetic polys or paresthesias. Last A1c was Normal & at goal: Lab Results  Component Value Date   HGBA1C 5.1 11/25/2017      Finally, patient has history of Vitamin D Deficiency and last Vitamin D was at goal: Lab Results  Component Value Date   VD25OH 74 11/25/2017   Current Outpatient Medications on File Prior to Visit  Medication Sig  . Ascorbic Acid (VITAMIN C PO) Take 1 tablet by mouth every other day. Takes qod  . aspirin EC 325 MG tablet Take 1 tablet (325 mg total) by mouth daily.  . Cholecalciferol (VITAMIN D3 ADULT GUMMIES PO) Take 2,000 Units by mouth daily.   . Cyanocobalamin (VITAMIN B-12 PO) Take by  mouth daily. Takes every other day  . Iron-Vitamins (GERITOL PO) Take by mouth daily.   No current facility-administered medications on file prior to visit.    Allergies  Allergen Reactions  . Ativan [Lorazepam]     Made her crazy  . Dilaudid [Hydromorphone Hcl]     Doesn't want  . Haldol [Haloperidol Lactate]     Made her crazy   Past Medical History:  Diagnosis Date  . Arthritis   . Breast cancer [facility]) 1985/2016   takes [name] daily  . Chronic back pain    stenosis/listhesis  . Family history of ovarian cancer   . Osteoporosis    takes Vit D  . Radiation 11/23/14-12/11/14   30 Gy T1-T5    Health Maintenance  Topic Date Due  . INFLUENZA VACCINE  12/25/2018  . TETANUS/TDAP  06/11/2019 (Originally 01/04/2018)  . PNA vac Low Risk Adult (1 of 2 - PCV13) 06/11/2019 (Originally 05/27/2004)  . DEXA SCAN  Completed   Immunization History  Administered Date(s) Administered  . Influenza Split 02/07/2013, 02/27/2017  . Influenza, High Dose Seasonal PF 03/14/2015, 03/24/2016, 03/05/2018  . Td 01/05/2008   Last Colon - Never - Patient repeatedly has declined  Colonoscopy  Last [facility] -  11/28/2017 & Breast MRI 02/25/2018  Past Surgical History:  Procedure Laterality Date  . ABDOMINAL HYSTERECTOMY  1980  . APPENDECTOMY  1977  . APPLICATION OF INTRAOPERATIVE CT SCAN N/A 10/20/2014   Procedure: APPLICATION OF INTRAOPERATIVE CAT SCAN;  Surgeon: [name], MD;  Location: [place] [facility];  Service: Neurosurgery;  Laterality: N/A;  . BREAST LUMPECTOMY WITH RADIOACTIVE SEED LOCALIZATION Left 05/03/2018   Procedure: LEFT BREAST LUMPECTOMY WITH BRACKETED RADIOACTIVE SEED LOCALIZATION;  Surgeon: [name], MD;  Location: [place];  Service: General;  Laterality: Left;  . BREAST SURGERY Right 1985  . THYROID CYST EXCISION  1967   Family History  Problem Relation Age of Onset  . Heart disease Mother   . Hypertension Mother   . Heart disease Father   . Diabetes  Father   . Breast cancer Paternal Aunt        4 paternal aunts with breast cancer over 26  . Prostate cancer Paternal Uncle   . Stroke Paternal Grandfather   . Ovarian cancer Paternal Aunt   . Huntington's disease Other        Nephew, inherited from his father   Social History   Tobacco Use  . Smoking status: Former Smoker    Packs/day: 0.50    Years: 10.00    Pack years: 5.00    Types: Cigarettes    Quit date: 05/03/1970    Years since quitting: 48.8  . Smokeless tobacco: Former Systems developer  . Tobacco comment: quit smoking 1970's  Substance Use Topics  . Alcohol use: Yes    Alcohol/week: 0.0 standard drinks    Comment: Rare  . Drug use: No    ROS Constitutional: Denies fever, chills, weight loss/gain, headaches, insomnia,  night sweats, and change in appetite. Does c/o fatigue. Eyes: Denies redness, blurred vision, diplopia, discharge, itchy, watery eyes.  ENT: Denies discharge, congestion, post nasal drip, epistaxis, sore throat, earache, hearing loss, dental pain, Tinnitus, Vertigo, Sinus pain, snoring.  Cardio: Denies chest pain, palpitations, irregular heartbeat, syncope, dyspnea, diaphoresis, orthopnea, PND, claudication, edema Respiratory: denies cough, dyspnea, DOE, pleurisy, hoarseness, laryngitis, wheezing.  Gastrointestinal: Denies dysphagia, heartburn, reflux, water brash, pain, cramps, nausea, vomiting, bloating, diarrhea, constipation, hematemesis, melena, hematochezia, jaundice, hemorrhoids Genitourinary: Denies dysuria, frequency, urgency, nocturia, hesitancy, discharge, hematuria, flank pain Breast: Breast lumps, nipple discharge, bleeding.  Musculoskeletal: Denies arthralgia, myalgia, stiffness, Jt. Swelling, pain, limp, and strain/sprain. Denies falls. Skin: Denies puritis, rash, hives, warts, acne, eczema, changing in skin lesion Neuro: No weakness, tremor, incoordination, spasms, paresthesia, pain Psychiatric: Denies confusion, memory loss, sensory loss. Denies  Depression. Endocrine: Denies change in weight, skin, hair change, nocturia, and paresthesia, diabetic polys, visual blurring, hyper / hypo glycemic episodes.  Heme/Lymph: No excessive bleeding, bruising, enlarged lymph nodes.  Physical Exam  BP 140/80   Pulse (!) 104   Temp 97.6 F (36.4 C)   Resp 16   Ht 5' 1.5" (1.562 m)   Wt 121 lb 9.6 oz (55.2 kg)   BMI 22.60 kg/m   General Appearance: Well nourished, well groomed and in no apparent distress.  Eyes: PERRLA, EOMs, conjunctiva no swelling or erythema, normal fundi and vessels. Sinuses: No frontal/maxillary tenderness ENT/Mouth: EACs patent / TMs  nl. Nares clear without erythema, swelling, mucoid exudates. Oral hygiene is good. No erythema, swelling, or exudate. Tongue normal, non-obstructing. Tonsils not swollen or erythematous. Hearing normal.  Neck: Supple, thyroid not palpable. No bruits, nodes or JVD. Respiratory: Respiratory effort normal.  BS equal and clear bilateral without rales, rhonci, wheezing or stridor. Cardio: Heart sounds are normal with regular rate and rhythm and no  murmurs, rubs or gallops. Peripheral pulses are normal and equal bilaterally without edema. No aortic or femoral bruits. Chest: symmetric with normal excursions and percussion. Breasts: Symmetric, without lumps, nipple discharge, retractions, or fibrocystic changes.  Abdomen: Flat, soft with bowel sounds active. Nontender, no guarding, rebound, hernias, masses, or organomegaly.  Lymphatics: Non tender without lymphadenopathy.  Musculoskeletal: Full ROM all peripheral extremities, joint stability, 5/5 strength, and normal gait. Skin: Warm and dry without rashes, lesions, cyanosis, clubbing or  ecchymosis.  Neuro: Cranial nerves intact, reflexes equal bilaterally. Normal muscle tone, no cerebellar symptoms. Sensation intact.  Pysch: Alert and oriented X 3, normal affect, Insight and Judgment appropriate.   Assessment and Plan  1. Labile hypertension   - EKG 12-Lead - Urinalysis, Routine w reflex microscopic - Microalbumin / creatinine urine ratio - CBC with Differential/Platelet - COMPLETE METABOLIC PANEL WITH GFR - Magnesium - TSH  2. Hyperlipidemia, mixed  - EKG 12-Lead - TSH  3. Abnormal glucose  - EKG 12-Lead - Hemoglobin A1c - Insulin, random  4. Vitamin D deficiency  - VITAMIN D 25 Hydroxy   5. Osteoporosis, unspecified osteoporosis type, unspecified pathological fracture presence   6. Carcinoma of left breast metastatic to multiple sites ([place])   7. Screening for ischemic heart disease  - EKG 12-Lead  8. Screening for colorectal cancer  - POC Hemoccult Bld/Stl  9. FHx: heart disease  - EKG 12-Lead  10. Former smoker  - EKG 12-Lead  11. Medication management  - Urinalysis, Routine w reflex microscopic - Microalbumin / creatinine urine ratio - CBC with Differential/Platelet - COMPLETE METABOLIC PANEL WITH GFR - Magnesium - TSH - Hemoglobin A1c - Insulin, random - VITAMIN D 25 Hydroxyl        Patient was counseled in prudent diet to achieve/maintain BMI less than 25 for weight control, BP monitoring, regular exercise and medications. Discussed med's effects and SE's. Screening labs and tests as requested with regular follow-up as recommended. Over 40 minutes of exam, counseling, chart review and high complex critical decision making was performed.   Kirtland Bouchard, MD

## 2019-02-07 NOTE — Patient Instructions (Signed)

## 2019-02-08 ENCOUNTER — Ambulatory Visit (INDEPENDENT_AMBULATORY_CARE_PROVIDER_SITE_OTHER): Payer: Medicare Other | Admitting: Internal Medicine

## 2019-02-08 ENCOUNTER — Other Ambulatory Visit: Payer: Self-pay

## 2019-02-08 VITALS — BP 140/80 | HR 104 | Temp 97.6°F | Resp 16 | Ht 61.5 in | Wt 121.6 lb

## 2019-02-08 DIAGNOSIS — Z79899 Other long term (current) drug therapy: Secondary | ICD-10-CM

## 2019-02-08 DIAGNOSIS — Z1211 Encounter for screening for malignant neoplasm of colon: Secondary | ICD-10-CM

## 2019-02-08 DIAGNOSIS — M810 Age-related osteoporosis without current pathological fracture: Secondary | ICD-10-CM

## 2019-02-08 DIAGNOSIS — Z87891 Personal history of nicotine dependence: Secondary | ICD-10-CM

## 2019-02-08 DIAGNOSIS — E782 Mixed hyperlipidemia: Secondary | ICD-10-CM

## 2019-02-08 DIAGNOSIS — R0989 Other specified symptoms and signs involving the circulatory and respiratory systems: Secondary | ICD-10-CM | POA: Diagnosis not present

## 2019-02-08 DIAGNOSIS — Z136 Encounter for screening for cardiovascular disorders: Secondary | ICD-10-CM

## 2019-02-08 DIAGNOSIS — E559 Vitamin D deficiency, unspecified: Secondary | ICD-10-CM | POA: Diagnosis not present

## 2019-02-08 DIAGNOSIS — C50912 Malignant neoplasm of unspecified site of left female breast: Secondary | ICD-10-CM

## 2019-02-08 DIAGNOSIS — Z8249 Family history of ischemic heart disease and other diseases of the circulatory system: Secondary | ICD-10-CM

## 2019-02-08 DIAGNOSIS — R7309 Other abnormal glucose: Secondary | ICD-10-CM

## 2019-02-09 LAB — COMPLETE METABOLIC PANEL WITH GFR
AG Ratio: 1.5 (calc) (ref 1.0–2.5)
ALT: 13 U/L (ref 6–29)
AST: 19 U/L (ref 10–35)
Albumin: 4.2 g/dL (ref 3.6–5.1)
Alkaline phosphatase (APISO): 38 U/L (ref 37–153)
BUN: 13 mg/dL (ref 7–25)
CO2: 28 mmol/L (ref 20–32)
Calcium: 10.4 mg/dL (ref 8.6–10.4)
Chloride: 104 mmol/L (ref 98–110)
Creat: 0.68 mg/dL (ref 0.60–0.93)
GFR, Est African American: 96 mL/min/{1.73_m2} (ref >=60)
GFR, Est Non African American: 83 mL/min/{1.73_m2} (ref >=60)
Globulin: 2.8 g/dL (calc) (ref 1.9–3.7)
Glucose, Bld: 84 mg/dL (ref 65–99)
Potassium: 4.3 mmol/L (ref 3.5–5.3)
Sodium: 141 mmol/L (ref 135–146)
Total Bilirubin: 0.4 mg/dL (ref 0.2–1.2)
Total Protein: 7 g/dL (ref 6.1–8.1)

## 2019-02-09 LAB — URINALYSIS, ROUTINE W REFLEX MICROSCOPIC
Bilirubin Urine: NEGATIVE
Glucose, UA: NEGATIVE
Hgb urine dipstick: NEGATIVE
Hyaline Cast: NONE SEEN /LPF
Ketones, ur: NEGATIVE
Nitrite: NEGATIVE
Protein, ur: NEGATIVE
RBC / HPF: NONE SEEN /HPF (ref 0–2)
Specific Gravity, Urine: 1.009 (ref 1.001–1.03)
pH: 8 (ref 5.0–8.0)

## 2019-02-09 LAB — HEMOGLOBIN A1C
Hgb A1c MFr Bld: 5.3 % of total Hgb (ref <5.7)
Mean Plasma Glucose: 105 (calc)
eAG (mmol/L): 5.8 (calc)

## 2019-02-09 LAB — MICROALBUMIN / CREATININE URINE RATIO
Creatinine, Urine: 33 mg/dL (ref 20–275)
Microalb Creat Ratio: 24 mcg/mg creat (ref <30)
Microalb, Ur: 0.8 mg/dL

## 2019-02-09 LAB — VITAMIN D 25 HYDROXY (VIT D DEFICIENCY, FRACTURES): Vit D, 25-Hydroxy: 64 ng/mL (ref 30–100)

## 2019-02-09 LAB — INSULIN, RANDOM: Insulin: 8.8 u[IU]/mL

## 2019-02-09 LAB — MAGNESIUM: Magnesium: 2 mg/dL (ref 1.5–2.5)

## 2019-02-09 LAB — TSH: TSH: 1.67 mIU/L (ref 0.40–4.50)

## 2019-02-10 NOTE — Progress Notes (Signed)
Note labs from PCP office 02/08/19. OK w/ Dr. Jana Hakim to use those labs for Xgeva tx 9/22. Marlon Pel, RN to cancel lab appt here 9/22.  Kennith Center, Pharm.D., CPP 02/10/2019@2 :42 PM

## 2019-02-11 ENCOUNTER — Telehealth: Payer: Self-pay | Admitting: Oncology

## 2019-02-11 ENCOUNTER — Other Ambulatory Visit: Payer: Self-pay | Admitting: Oncology

## 2019-02-11 NOTE — Telephone Encounter (Signed)
Changed appt per 9/18 sch message - pt aware appt is changed to a phone call

## 2019-02-14 NOTE — Progress Notes (Signed)
April Rosales  [phone] Fax:([phone]    ID: LAURELAI LEPP DOB: [birth year]  MR#: [id number]  [name]  Patient Care Team: [name], MD as PCP - General (Internal Medicine) Mirella Gueye, [name], MD as Consulting Physician (Oncology) [name], MD as Consulting Physician (Orthopedic Surgery) [name], MD as Consulting Physician (General Surgery) PCP: [name], MD  OTHER: [name], DDS   CHIEF COMPLAINT: Stage IV estrogen receptor positive breast cancer  CURRENT TREATMENT:  [ Fulvestrant, denosumab/Xgeva ]; letrozole   I connected with [name] on 02/15/19 at 12:00 PM EDT by telephone visit and verified that I am speaking with the correct person using two identifiers.   I discussed the limitations, risks, security and privacy concerns of performing an evaluation and management service by telemedicine and the availability of in-person appointments. I also discussed with the patient that there may be a patient responsible charge related to this service. The patient expressed understanding and agreed to proceed.   Other persons participating in the visit and their role in the encounter: none  Patients location: Home  Providers location: Clinic   Chief Complaint: Stage IV estrogen receptor positive breast cancer    INTERVAL HISTORY: April Rosales is contacted today for follow-up and treatment of her stage IV estrogen receptor positive breast cancer. She was last seen here on 09/13/2018.   We are holding the fulvestrant and palbociclib because of the pandemic.  She was supposed to have been started on tamoxifen but tells me she is not taking any medications at this point  Since her last visit here, she underwent a PET scan on 11/02/2018 showing: No hypermetabolic metastatic disease in the neck, chest, abdomen, or pelvis. Bilateral calcified pleural plaques consistent with prior asbestos exposure.  Cholelithiasis. Aortic Atherosclerois (ICD10-170.0)   REVIEW OF SYSTEMS:  April Rosales is doing "fine".  She just saw her primary care physician and had a "good physical exam".  She is pretty much staying in the house and "going stir crazy".  She does a little shopping for food and that is about it.  She does minimal housework and minimal cooking.  This is not a change for her.  Overall she says she is doing "as well as I have in a long time".  Detailed review of systems today was otherwise noncontributory  BREAST CANCER HISTORY: From the original intake note:   April Rosales underwent right lumpectomy in 1985 at [facility] for what sounds like a stage I breast cancer. She tells me she had more than 30 lymph nodes removed from her right axilla and all of them were clear. She received  adjuvant radiation but no systemic treatment.  The patient had recently refused mammography with the last mammogram I can find dating back to August 2010.  More recently the patient presented with left scapular pain radiating down the left arm.  She was evaluated by Dr. [name], who obtained a chest x-ray showing a possible abnormality at T3. He then set up the patient for an MRI of the thoracic spine performed 08/16/2014.  This showed multiple compression fractures  (a similar picture had been noted on lumbar MRI 10/09/2011 ). However at T3 they noted tumor in the vertebral body extending into the left pedicle and into the lateral epidural space, displacing the cord to the right. There was no evidence of cord compression or cord signal abnormality. There were no other areas of tumor identified in the thoracic spine.  The patient was then referred to Dr. Hal Neer who on 08/24/2014 set [name] up for CT scans of the chest, abdomen and pelvis. There was a dense mass in the upper inner quadrant of the left breast measuring 1.6 cm. There was a 1.2 cm nodule in the minor fissure of the right lung and some evidence of  right lung fibrosis at the site of the prior radiation port.  There was also a thyroid mass measuring 2 cm. However there were no parenchymal lung or liver lesions. Incidental meningoceles were noted as well as sclerosis of the fifth and sixth ribs which were felt to be likely posttraumatic.   On 08/29/2014 the patient underwent bilateral diagnostic mammography with tomosynthesis and left breast ultrasonography.  There were postsurgical changes in the upper right breast. In the left breast there was an irregular mass measuring 2.3 cm in the upper inner quadrant. This was palpable. Ultrasound showed this to be hypoechoic and to measure 2.0 cm. There were adjacent areas of nodularity. There was no definite lymphadenopathy in the left axilla.  Biopsy of this breast mass 08/29/2014 showed an invasive adenocarcinoma with both ductal and lobular features (there was strong diffuse E-cadherin expression as well as areas with total absence of E-cadherin expression), with the preliminary prognostic profile showing strong estrogen positivity, very weak to near absent progesterone positivity, an MIB-1 of approximately 40%, and HER-2 equivocal  The patient's subsequent history is as detailed below.   PAST MEDICAL HISTORY: Past Medical History:  Diagnosis Date   Arthritis    Breast cancer ([place]) 1985/2016   takes [name] daily   Chronic back pain    stenosis/listhesis   Family history of ovarian cancer    Osteoporosis    takes Vit D   Radiation 11/23/14-12/11/14   30 Gy T1-T5     PAST SURGICAL HISTORY: Past Surgical History:  Procedure Laterality Date   ABDOMINAL HYSTERECTOMY     1980   APPENDECTOMY  4580   APPLICATION OF INTRAOPERATIVE CT SCAN N/A 10/20/2014   Procedure: APPLICATION OF INTRAOPERATIVE CAT SCAN;  Surgeon: [name], MD;  Location: [facility];  Service: Neurosurgery;  Laterality: N/A;   BREAST LUMPECTOMY WITH RADIOACTIVE SEED LOCALIZATION Left 05/03/2018   Procedure: LEFT  BREAST LUMPECTOMY WITH BRACKETED RADIOACTIVE SEED LOCALIZATION;  Surgeon: [name], MD;  Location: [place];  Service: General;  Laterality: Left;   BREAST SURGERY Right 1985   THYROID CYST EXCISION  1967    FAMILY HISTORY Family History  Problem Relation Age of Onset   Heart disease Mother    Hypertension Mother    Heart disease Father    Diabetes Father    Breast cancer Paternal Aunt        4 paternal aunts with breast cancer over 62   Prostate cancer Paternal Uncle    Stroke Paternal Grandfather    Ovarian cancer Paternal Aunt    Huntington's disease Other        Nephew, inherited from his father   The patient's father died from a heart attack at the age of 66. He had 9 sisters. 3 of those sisters had breast cancer, all in a menopausal setting. Another sister had ovarian cancer. One of the paternal uncles had cancer of the colon "and back".  The patient's mother died at the age of 54. She was found to have breast cancer shortly before dying , during her final hospitalization.   GYNECOLOGIC HISTORY:  No LMP recorded. Patient has had a  hysterectomy.  Menarche age 63, first live birth age 53, the patient is GX P1. She underwent hysterectomy in 1980. She thinks the ovaries were removed, but the CT scan obtained 08/24/2014 showed a definite right ovary. The left ovary may have been removed. She did not take hormone replacement after the hysterectomy.   SOCIAL HISTORY:   Kristyne worked in Psychiatric nurse. She is divorced, lives alone, with 2 cats. Her son Bethann Berkshire lives in [place] where he works in Engineer, technical sales. He has 4 children of his own. The patient is a Psychologist, forensic.   ADVANCED DIRECTIVES:  In place. The patient has named her sister Pleas Koch [phone]) and her friend Janina Mayo [phone]) as joint healthcare powers of attorney   HEALTH MAINTENANCE: Social History   Tobacco Use   Smoking status: Former Smoker    Packs/day:  0.50    Years: 10.00    Pack years: 5.00    Types: Cigarettes    Quit date: 05/03/1970    Years since quitting: 48.8   Smokeless tobacco: Former Systems developer   Tobacco comment: quit smoking 1970's  Substance Use Topics   Alcohol use: Yes    Alcohol/week: 0.0 standard drinks    Comment: Rare   Drug use: No     Colonoscopy: never  PAP: status post hysterectomy  Bone density: 09/12/2016 [facility]/ T score of -4.8 osteoporosis  Lipid panel:  Allergies  Allergen Reactions   Ativan [Lorazepam]     Made her crazy   Dilaudid [Hydromorphone Hcl]     Doesn't want   Haldol [Haloperidol Lactate]     Made her crazy    Current Outpatient Medications  Medication Sig Dispense Refill   Ascorbic Acid (VITAMIN C PO) Take 1 tablet by mouth every other day. Takes qod     aspirin EC 325 MG tablet Take 1 tablet (325 mg total) by mouth daily. 30 tablet 0   Cholecalciferol (VITAMIN D3 ADULT GUMMIES PO) Take 2,000 Units by mouth daily.      Cyanocobalamin (VITAMIN B-12 PO) Take by mouth daily. Takes every other day     Iron-Vitamins (GERITOL PO) Take by mouth daily.     letrozole (FEMARA) 2.5 MG tablet Take 1 tablet (2.5 mg total) by mouth daily. 90 tablet 4   No current facility-administered medications for this visit.     OBJECTIVE: Older white woman in no acute distress  There were no vitals filed for this visit. Wt Readings from Last 3 Encounters:  02/08/19 121 lb 9.6 oz (55.2 kg)  06/24/18 122 lb 6.4 oz (55.5 kg)  06/10/18 123 lb 3.2 oz (55.9 kg)   There is no height or weight on file to calculate BMI.    ECOG FS:1 - Symptomatic but completely ambulatory  Telehealth Visit 02/14/2019    LAB RESULTS:  CMP     Component Value Date/Time   NA 141 02/08/2019 1145   NA 141 05/29/2017 1417   K 4.3 02/08/2019 1145   K 3.6 05/29/2017 1417   CL 104 02/08/2019 1145   CO2 28 02/08/2019 1145   CO2 27 05/29/2017 1417   GLUCOSE 84 02/08/2019 1145   GLUCOSE 111 05/29/2017 1417   BUN  13 02/08/2019 1145   BUN 17.0 05/29/2017 1417   CREATININE 0.68 02/08/2019 1145   CREATININE 0.8 05/29/2017 1417   CALCIUM 10.4 02/08/2019 1145   CALCIUM 10.7 (H) 06/26/2017 1216   CALCIUM 10.4 05/29/2017 1417   PROT 7.0 02/08/2019 1145   PROT 7.3 05/29/2017 1417  ALBUMIN 3.8 12/29/2018 1411   ALBUMIN 3.9 05/29/2017 1417   AST 19 02/08/2019 1145   AST 17 05/29/2017 1417   ALT 13 02/08/2019 1145   ALT 15 05/29/2017 1417   ALKPHOS 38 12/29/2018 1411   ALKPHOS 42 05/29/2017 1417   BILITOT 0.4 02/08/2019 1145   BILITOT 0.30 05/29/2017 1417   GFRNONAA 83 02/08/2019 1145   GFRAA 96 02/08/2019 1145    INo results found for: SPEP, UPEP  Lab Results  Component Value Date   WBC 5.4 12/29/2018   NEUTROABS 3.5 12/29/2018   HGB 14.9 12/29/2018   HCT 44.3 12/29/2018   MCV 92.7 12/29/2018   PLT 246 12/29/2018      Chemistry      Component Value Date/Time   NA 141 02/08/2019 1145   NA 141 05/29/2017 1417   K 4.3 02/08/2019 1145   K 3.6 05/29/2017 1417   CL 104 02/08/2019 1145   CO2 28 02/08/2019 1145   CO2 27 05/29/2017 1417   BUN 13 02/08/2019 1145   BUN 17.0 05/29/2017 1417   CREATININE 0.68 02/08/2019 1145   CREATININE 0.8 05/29/2017 1417      Component Value Date/Time   CALCIUM 10.4 02/08/2019 1145   CALCIUM 10.7 (H) 06/26/2017 1216   CALCIUM 10.4 05/29/2017 1417   ALKPHOS 38 12/29/2018 1411   ALKPHOS 42 05/29/2017 1417   AST 19 02/08/2019 1145   AST 17 05/29/2017 1417   ALT 13 02/08/2019 1145   ALT 15 05/29/2017 1417   BILITOT 0.4 02/08/2019 1145   BILITOT 0.30 05/29/2017 1417       Lab Results  Component Value Date   LABCA2 24 01/01/2016    No components found for: [id number]  No results for input(s): INR in the last 168 hours.  Urinalysis    Component Value Date/Time   COLORURINE YELLOW 02/08/2019 1145   APPEARANCEUR CLOUDY (A) 02/08/2019 1145   LABSPEC 1.009 02/08/2019 1145   PHURINE 8.0 02/08/2019 1145   GLUCOSEU NEGATIVE 02/08/2019 1145    HGBUR NEGATIVE 02/08/2019 1145   BILIRUBINUR NEGATIVE 09/19/2015 1239   KETONESUR NEGATIVE 02/08/2019 1145   PROTEINUR NEGATIVE 02/08/2019 1145   NITRITE NEGATIVE 02/08/2019 1145   LEUKOCYTESUR 2+ (A) 02/08/2019 1145    STUDIES: No results found.   ASSESSMENT: 78 y.o. [place] woman with stage IV left breast cancer involving bone  (1) status post right lumpectomy and axillary node dissection in 1985 followed by radiation at [facility] 08/16/2014: measurable disease in spine, lung and left breast   (2) evaluation for left shoulder pain led to thoracic spine MRI 08/16/2014 showing a pathologic fracture at T3 with epidural tumor displacing the cord to the right, but no cord compression. CT scans of the chest, abdomen and pelvis 08/24/2014 showed in addition a mass in the upper outer quadrant left breast measuring 1.6 cm and a nodule in the minor fissure of the right lung measuring 1.2 cm, but no parenchymal lung or liver lesions.   (a) CA 27-29 was noninformative at 38 (09/20/2014)    (3) mammography and ultrasonography 08/29/2014 show a mass in the upper inner left breast which was palpable,  measuring 2.0 cm by ultrasound. Biopsy of this mass 08/29/2014 showed an invasive breast cancer with both lobular and ductal features, estrogen receptor positive, progesterone receptor weakly positive, with an MIB-1 in the 40% range, HER-2 equivocal (6 else ratio 1.5, but average number her nucleus 5.8)   (4) letrozole started 08/31/2014;   (a)  palbociclib added Sept 2016 at 75 mg 21/7, with significant neutropenia; not repeated after first cycle  (b) letrozole discontinued 05/29/2017 with evidence of disease progression in the breast   (5)  zolendronate started 09/20/2014, stopped after initial dose due to poor tolerance  (a) denosumab/ Xgeva started 01/31/2015, repeated every 4 weeks  (b) changed to every 8 weeks as of August 2018.    (6) on 10/20/2014 the patient underwent T2-T3 and  T4 decompressive laminectomy with removal of epidural tumor, C7-T4 segmental pedicle screw instrumentation with virage screw system with arrow guidance protocol and C7-T4 posterolateral fusion. The cells were positive for the estrogen receptor. HER-2/neu testing by [facility] showed again equivocal results,  (a) most recent bone scan 10/01/2016 finds no new lesions only postoperative changes  (7) radiation 11/23/2014-12/11/2014.  (a) T1-T5 was treated to 30 Gy in 12 fractions at 2.5 Gy per fraction   (8) initiated close follow-up while considering eventual left lumpectomy or mastectomy depending on  longer-term results of systemic therapy  (a) most recent left breast ultrasonography 10/30/2016 found this mass to measure 0.7 cm  (b) repeat left breast ultrasonography 05/13/2017 showed the upper outer quadrant mass to have grown to 1.4 cm  (c) left breast ultrasound 09/08/2017 shows the mass to now measure 0.9 cm  (d) left breast biopsy x2 on 12/11/2017 shows high-grade ductal carcinoma in situ, essentially estrogen receptor negative  (9) genetics testing using the Breast/Ovarian Cancer Panel through [facility] [name], MD) found no deleterious mutations in ATM, BARD1, BRCA1, BRCA2, BRIP1, CDH1, CHEK2, EPCAM, FANCC, MLH1, MSH2, MSH6, NBN, PALB2, PMS2, PTEN, RAD51C, RAD51D, STK11, TP53, or XRCC2    (10) right thyroid nodule is a complex cyst as noted on CT scan of the neck 01/29/2016  (11) osteoporosis: Bone density at [facility] 09/12/2016 showed a T score of -4.8.  (a) continue denosumab/Xgeva as above  (12) fulvestrant started 05/29/2017  (a) started palbociclib 06/29/2017 at 75 mg every other day for 21 days on, 7 days off  (b) palbociclib discontinued on 3/29 due to progressive fatigue and patient preference  (13)  was to start abemaciclib 02/04/2018, but patient opted for going back to palbociclib at a lower dose beginning in November 19, patient subsequently declined s palbociclib, and  proceeded to  surgery  (14) status post left lumpectomy 05/03/2018 showing a pT1b pNX invasive ductal carcinoma, grade 2, with equivocal HER-2 results  (a) opted against adjuvant radiation given presence of stage IV disease  (15) tamoxifen supposed to have been started started 09/13/2018 as a "bridge" pending resumption of fulvestrant/denosumab, but never started by the patient  (16) PET scan 11/02/2018 showed no active disease  (17) letrozole started 02/15/2019  PLAN: Rache is doing remarkably well as far as her breast cancer is concerned.  I am concerned that she is not taking antiestrogens and we discussed tamoxifen versus letrozole.  She did very well with letrozole in the past and "does not mind going back on it" at this point.  That certainly is better than nothing.  I have put the order in and she will let me know if she has any problems from it.  Otherwise we will touch base again in 3 months, but I am not comfortable with her resuming treatment here until after we have either good treatment for the coronavirus or she receives the vaccine  She knows to call for any other issue that may develop before the next visit.  Sally Menard, [name], MD  02/15/19 12:33 PM Medical Oncology  and Hematology [facility] [place], [place] [address] Tel. [phone]    Fax. [phone]  I, Jacqualyn Posey am acting as a Education administrator for Chauncey Cruel, MD.   I, Lurline Del MD, have reviewed the above documentation for accuracy and completeness, and I agree with the above.

## 2019-02-15 ENCOUNTER — Other Ambulatory Visit: Payer: Self-pay

## 2019-02-15 ENCOUNTER — Inpatient Hospital Stay: Payer: Medicare Other | Attending: Oncology | Admitting: Oncology

## 2019-02-15 ENCOUNTER — Telehealth: Payer: Self-pay | Admitting: Oncology

## 2019-02-15 ENCOUNTER — Other Ambulatory Visit: Payer: Medicare Other

## 2019-02-15 ENCOUNTER — Inpatient Hospital Stay: Payer: Medicare Other

## 2019-02-15 DIAGNOSIS — C50412 Malignant neoplasm of upper-outer quadrant of left female breast: Secondary | ICD-10-CM | POA: Diagnosis not present

## 2019-02-15 DIAGNOSIS — C7951 Secondary malignant neoplasm of bone: Secondary | ICD-10-CM | POA: Diagnosis not present

## 2019-02-15 DIAGNOSIS — C50919 Malignant neoplasm of unspecified site of unspecified female breast: Secondary | ICD-10-CM | POA: Diagnosis not present

## 2019-02-15 DIAGNOSIS — Z170 Estrogen receptor positive status [ER+]: Secondary | ICD-10-CM

## 2019-02-15 MED ORDER — LETROZOLE 2.5 MG PO TABS: 2.5000 mg | ORAL_TABLET | Freq: Every day | ORAL | 4 refills | Status: DC

## 2019-02-15 NOTE — Telephone Encounter (Signed)
I talk with patient regarding schedule  

## 2019-04-13 DIAGNOSIS — Z23 Encounter for immunization: Secondary | ICD-10-CM | POA: Diagnosis not present

## 2019-05-16 NOTE — Progress Notes (Signed)
[place]  [phone] [phone]    ID: April Rosales DOB: [birth year]  MR#: [id number]  BWL#:[id number]  Patient Care Team: [name], MD as PCP - General (Internal Medicine) Blaike Newburn, [name], MD as Consulting Physician (Oncology) [name], MD as Consulting Physician (Orthopedic Surgery) [name], MD as Consulting Physician (General Surgery) PCP: [name], MD  OTHER: [name], DDS   CHIEF COMPLAINT: Stage IV estrogen receptor positive breast cancer  CURRENT TREATMENT:  [ Fulvestrant, denosumab/Xgeva ]; letrozole   I connected with April Rosales on 05/17/19 at 12:00 PM EST by telephone visit and verified that I am speaking with the correct person using two identifiers.   I discussed the limitations, risks, security and privacy concerns of performing an evaluation and management service by telemedicine and the availability of in-person appointments. I also discussed with the patient that there may be a patient responsible charge related to this service. The patient expressed understanding and agreed to proceed.   Other persons participating in the visit and their role in the encounter: none  Patient's location: Home  Provider's location: Clinic   Chief Complaint: Stage IV estrogen receptor positive breast cancer   INTERVAL HISTORY: April Rosales is contacted today for follow-up of her stage IV estrogen receptor positive breast cancer.  She sounds fine and tells me she is indeed doing "just fine".  She is not going anywhere she says.  She is staying home.  This is not that different from what she was doing before she says.  We are holding the fulvestrant and palbociclib because of the pandemic. She was placed back on letrozole and she is tolerating this well, with no hot flashes no vaginal dryness and no arthralgias or myalgias.   REVIEW OF SYSTEMS:  April Rosales is very concerned about the vaccine and does not  think she wants to get it for now.  We discussed that at length.  She is being however very protective of her contacts and basically does not go out except very rarely to the drugstore or the grocery store.  She has had no unusual headaches visual changes cough phlegm production pleurisy shortness of breath or change in bowel or bladder habits.  She has not had fever or rash.  Detailed review of systems today was stable.   BREAST CANCER HISTORY: From the original intake note:   Guelda underwent right lumpectomy in 1985 at [facility] for what sounds like a stage I breast cancer. She tells me she had more than 30 lymph nodes removed from her right axilla and all of them were clear. She received  adjuvant radiation but no systemic treatment.  The patient had recently refused mammography with the last mammogram I can find dating back to August 2010.  More recently the patient presented with left scapular pain radiating down the left arm.  She was evaluated by Dr. [name], who obtained a chest x-ray showing a possible abnormality at T3. He then set up the patient for an MRI of the thoracic spine performed 08/16/2014.  This showed multiple compression fractures  (a similar picture had been noted on lumbar MRI 10/09/2011 ). However at T3 they noted tumor in the vertebral body extending into the left pedicle and into the lateral epidural space, displacing the cord to the right. There was no evidence of cord compression or cord signal abnormality. There were no other areas of tumor identified in the thoracic spine.  The patient was then referred to Dr. Hal Neer who on 08/24/2014 set April Rosales up for CT scans of the chest, abdomen and pelvis. There was a dense mass in the upper inner quadrant of the left breast measuring 1.6 cm. There was a 1.2 cm nodule in the minor fissure of the right lung and some evidence of right lung fibrosis at the site of the prior radiation port.  There was also a thyroid  mass measuring 2 cm. However there were no parenchymal lung or liver lesions. Incidental meningoceles were noted as well as sclerosis of the fifth and sixth ribs which were felt to be likely posttraumatic.   On 08/29/2014 the patient underwent bilateral diagnostic mammography with tomosynthesis and left breast ultrasonography.  There were postsurgical changes in the upper right breast. In the left breast there was an irregular mass measuring 2.3 cm in the upper inner quadrant. This was palpable. Ultrasound showed this to be hypoechoic and to measure 2.0 cm. There were adjacent areas of nodularity. There was no definite lymphadenopathy in the left axilla.  Biopsy of this breast mass 08/29/2014 showed an invasive adenocarcinoma with both ductal and lobular features (there was strong diffuse E-cadherin expression as well as areas with total absence of E-cadherin expression), with the preliminary prognostic profile showing strong estrogen positivity, very weak to near absent progesterone positivity, an MIB-1 of approximately 40%, and HER-2 equivocal  The patient's subsequent history is as detailed below.   PAST MEDICAL HISTORY: Past Medical History:  Diagnosis Date  . Arthritis   . Breast cancer [facility]) 1985/2016   takes [name] daily  . Chronic back pain    stenosis/listhesis  . Family history of ovarian cancer   . Osteoporosis    takes Vit D  . Radiation 11/23/14-12/11/14   30 Gy T1-T5     PAST SURGICAL HISTORY: Past Surgical History:  Procedure Laterality Date  . ABDOMINAL HYSTERECTOMY     1980  . APPENDECTOMY  1977  . APPLICATION OF INTRAOPERATIVE CT SCAN N/A 10/20/2014   Procedure: APPLICATION OF INTRAOPERATIVE CAT SCAN;  Surgeon: [name], MD;  Location: [facility];  Service: Neurosurgery;  Laterality: N/A;  . BREAST LUMPECTOMY WITH RADIOACTIVE SEED LOCALIZATION Left 05/03/2018   Procedure: LEFT BREAST LUMPECTOMY WITH BRACKETED RADIOACTIVE SEED LOCALIZATION;  Surgeon: [name], MD;  Location: [facility];  Service: General;  Laterality: Left;  . BREAST SURGERY Right 1985  . THYROID CYST EXCISION  1967    FAMILY HISTORY Family History  Problem Relation Age of Onset  . Heart disease Mother   . Hypertension Mother   . Heart disease Father   . Diabetes Father   . Breast cancer Paternal Aunt        4 paternal aunts with breast cancer over 12  . Prostate cancer Paternal Uncle   . Stroke Paternal Grandfather   . Ovarian cancer Paternal Aunt   . Huntington's disease Other        Nephew, inherited from his father   The patient's father died from a heart attack at the age of 74. He had 9 sisters. 3 of those sisters had breast cancer, all in a menopausal setting. Another sister had ovarian cancer. One of the paternal uncles had cancer of the colon "and back".  The patient's mother died at the age of 66. She was found to have breast cancer shortly before dying , during her final hospitalization.   GYNECOLOGIC HISTORY:  No LMP recorded. Patient has had a  hysterectomy.  Menarche age 43, first live birth age 47, the patient is GX P1. She underwent hysterectomy in 1980. She thinks the ovaries were removed, but the CT scan obtained 08/24/2014 showed a definite right ovary. The left ovary may have been removed. She did not take hormone replacement after the hysterectomy.   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 She is divorced, lives alone, with 2 cats. Her son April Rosales lives in [place] where he works in Engineer, technical sales. He has 4 children of his own. The patient is a Psychologist, forensic.   ADVANCED DIRECTIVES:  In place. The patient has named her sister Pleas Koch [phone]) and her friend Janina Mayo [phone]) as joint healthcare powers of attorney   HEALTH MAINTENANCE: Social History   Tobacco Use  . Smoking status: Former Smoker    Packs/day: 0.50    Years: 10.00    Pack years: 5.00    Types: Cigarettes    Quit date:  05/03/1970    Years since quitting: 49.0  . Smokeless tobacco: Former Systems developer  . Tobacco comment: quit smoking 1970's  Substance Use Topics  . Alcohol use: Yes    Alcohol/week: 0.0 standard drinks    Comment: Rare  . Drug use: No     Colonoscopy: never  PAP: status post hysterectomy  Bone density: 09/12/2016 [facility]/ T score of -4.8 osteoporosis  Lipid panel:  Allergies  Allergen Reactions  . Ativan [Lorazepam]     Made her crazy  . Dilaudid [Hydromorphone Hcl]     Doesn't want  . Haldol [Haloperidol Lactate]     Made her crazy    Current Outpatient Medications  Medication Sig Dispense Refill  . Ascorbic Acid (VITAMIN C PO) Take 1 tablet by mouth every other day. Takes qod    . aspirin EC 325 MG tablet Take 1 tablet (325 mg total) by mouth daily. 30 tablet 0  . Cholecalciferol (VITAMIN D3 ADULT GUMMIES PO) Take 2,000 Units by mouth daily.     . Cyanocobalamin (VITAMIN B-12 PO) Take by mouth daily. Takes every other day    . Iron-Vitamins (GERITOL PO) Take by mouth daily.    Marland Kitchen letrozole (FEMARA) 2.5 MG tablet Take 1 tablet (2.5 mg total) by mouth daily. 90 tablet 4   No current facility-administered medications for this visit.    OBJECTIVE: Older white woman in no acute distress  There were no vitals filed for this visit. Wt Readings from Last 3 Encounters:  02/08/19 121 lb 9.6 oz (55.2 kg)  06/24/18 122 lb 6.4 oz (55.5 kg)  06/10/18 123 lb 3.2 oz (55.9 kg)   There is no height or weight on file to calculate BMI.    ECOG FS:1 - Symptomatic but completely ambulatory  Telehealth Visit 05/17/2019    LAB RESULTS:  CMP     Component Value Date/Time   NA 141 02/08/2019 1145   NA 141 05/29/2017 1417   K 4.3 02/08/2019 1145   K 3.6 05/29/2017 1417   CL 104 02/08/2019 1145   CO2 28 02/08/2019 1145   CO2 27 05/29/2017 1417   GLUCOSE 84 02/08/2019 1145   GLUCOSE 111 05/29/2017 1417   BUN 13 02/08/2019 1145   BUN 17.0 05/29/2017 1417   CREATININE 0.68 02/08/2019 1145    CREATININE 0.8 05/29/2017 1417   CALCIUM 10.4 02/08/2019 1145   CALCIUM 10.7 (H) 06/26/2017 1216   CALCIUM 10.4 05/29/2017 1417   PROT 7.0 02/08/2019 1145   PROT 7.3 05/29/2017 1417  ALBUMIN 3.8 12/29/2018 1411   ALBUMIN 3.9 05/29/2017 1417   AST 19 02/08/2019 1145   AST 17 05/29/2017 1417   ALT 13 02/08/2019 1145   ALT 15 05/29/2017 1417   ALKPHOS 38 12/29/2018 1411   ALKPHOS 42 05/29/2017 1417   BILITOT 0.4 02/08/2019 1145   BILITOT 0.30 05/29/2017 1417   GFRNONAA 83 02/08/2019 1145   GFRAA 96 02/08/2019 1145    INo results found for: SPEP, UPEP  Lab Results  Component Value Date   WBC 5.4 12/29/2018   NEUTROABS 3.5 12/29/2018   HGB 14.9 12/29/2018   HCT 44.3 12/29/2018   MCV 92.7 12/29/2018   PLT 246 12/29/2018      Chemistry      Component Value Date/Time   NA 141 02/08/2019 1145   NA 141 05/29/2017 1417   K 4.3 02/08/2019 1145   K 3.6 05/29/2017 1417   CL 104 02/08/2019 1145   CO2 28 02/08/2019 1145   CO2 27 05/29/2017 1417   BUN 13 02/08/2019 1145   BUN 17.0 05/29/2017 1417   CREATININE 0.68 02/08/2019 1145   CREATININE 0.8 05/29/2017 1417      Component Value Date/Time   CALCIUM 10.4 02/08/2019 1145   CALCIUM 10.7 (H) 06/26/2017 1216   CALCIUM 10.4 05/29/2017 1417   ALKPHOS 38 12/29/2018 1411   ALKPHOS 42 05/29/2017 1417   AST 19 02/08/2019 1145   AST 17 05/29/2017 1417   ALT 13 02/08/2019 1145   ALT 15 05/29/2017 1417   BILITOT 0.4 02/08/2019 1145   BILITOT 0.30 05/29/2017 1417       Lab Results  Component Value Date   LABCA2 24 01/01/2016    No components found for: [id number]  No results for input(s): INR in the last 168 hours.  Urinalysis    Component Value Date/Time   COLORURINE YELLOW 02/08/2019 1145   APPEARANCEUR CLOUDY (A) 02/08/2019 1145   LABSPEC 1.009 02/08/2019 1145   PHURINE 8.0 02/08/2019 1145   GLUCOSEU NEGATIVE 02/08/2019 1145   HGBUR NEGATIVE 02/08/2019 1145   BILIRUBINUR NEGATIVE 09/19/2015 1239   KETONESUR  NEGATIVE 02/08/2019 1145   PROTEINUR NEGATIVE 02/08/2019 1145   NITRITE NEGATIVE 02/08/2019 1145   LEUKOCYTESUR 2+ (A) 02/08/2019 1145    STUDIES: No results found.   ASSESSMENT: 78 y.o. [place] woman with stage IV left breast cancer involving bone  (1) status post right lumpectomy and axillary node dissection in 1985 followed by radiation at [facility] 08/16/2014: measurable disease in spine, lung and left breast   (2) evaluation for left shoulder pain led to thoracic spine MRI 08/16/2014 showing a pathologic fracture at T3 with epidural tumor displacing the cord to the right, but no cord compression. CT scans of the chest, abdomen and pelvis 08/24/2014 showed in addition a mass in the upper outer quadrant left breast measuring 1.6 cm and a nodule in the minor fissure of the right lung measuring 1.2 cm, but no parenchymal lung or liver lesions.   (a) CA 27-29 was noninformative at 38 (09/20/2014)    (3) mammography and ultrasonography 08/29/2014 show a mass in the upper inner left breast which was palpable,  measuring 2.0 cm by ultrasound. Biopsy of this mass 08/29/2014 showed an invasive breast cancer with both lobular and ductal features, estrogen receptor positive, progesterone receptor weakly positive, with an MIB-1 in the 40% range, HER-2 equivocal (6 else ratio 1.5, but average number her nucleus 5.8)   (4) letrozole started 08/31/2014;   (a)  palbociclib added Sept 2016 at 75 mg 21/7, with significant neutropenia; not repeated after first cycle  (b) letrozole discontinued 05/29/2017 with evidence of disease progression in the breast   (5)  zolendronate started 09/20/2014, stopped after initial dose due to poor tolerance  (a) denosumab/ Xgeva started 01/31/2015, repeated every 4 weeks  (b) changed to every 8 weeks as of August 2018.    (6) on 10/20/2014 the patient underwent T2-T3 and T4 decompressive laminectomy with removal of epidural tumor, C7-T4 segmental  pedicle screw instrumentation with virage screw system with arrow guidance protocol and C7-T4 posterolateral fusion. The cells were positive for the estrogen receptor. HER-2/neu testing by [facility] showed again equivocal results,  (a) most recent bone scan 10/01/2016 finds no new lesions only postoperative changes  (7) radiation 11/23/2014-12/11/2014.  (a) T1-T5 was treated to 30 Gy in 12 fractions at 2.5 Gy per fraction   (8) initiated close follow-up while considering eventual left lumpectomy or mastectomy depending on  longer-term results of systemic therapy  (a) most recent left breast ultrasonography 10/30/2016 found this mass to measure 0.7 cm  (b) repeat left breast ultrasonography 05/13/2017 showed the upper outer quadrant mass to have grown to 1.4 cm  (c) left breast ultrasound 09/08/2017 shows the mass to now measure 0.9 cm  (d) left breast biopsy x2 on 12/11/2017 shows high-grade ductal carcinoma in situ, essentially estrogen receptor negative  (9) genetics testing using the Breast/Ovarian Cancer Panel through [facility] [name], MD) found no deleterious mutations in ATM, BARD1, BRCA1, BRCA2, BRIP1, CDH1, CHEK2, EPCAM, FANCC, MLH1, MSH2, MSH6, NBN, PALB2, PMS2, PTEN, RAD51C, RAD51D, STK11, TP53, or XRCC2    (10) right thyroid nodule is a complex cyst as noted on CT scan of the neck 01/29/2016  (11) osteoporosis: [facility] at [facility] 09/12/2016 showed a T score of -4.8.  (a) continue denosumab/Xgeva as above  (12) fulvestrant started 05/29/2017  (a) started palbociclib 06/29/2017 at 75 mg every other day for 21 days on, 7 days off  (b) palbociclib discontinued on 3/29 due to progressive fatigue and patient preference  (13)  was to start abemaciclib 02/04/2018, but patient opted for going back to palbociclib at a lower dose beginning in November 19, patient subsequently declined s palbociclib, and  proceeded to surgery  (14) status post left lumpectomy 05/03/2018 showing a pT1b pNX  invasive ductal carcinoma, grade 2, with equivocal HER-2 results  (a) opted against adjuvant radiation given presence of stage IV disease  (15) tamoxifen supposed to have been started started 09/13/2018 as a "bridge" pending resumption of fulvestrant/denosumab, but never started by the patient  (16) PET scan 11/02/2018 showed no active disease  (17) letrozole started 02/15/2019  PLAN: Donya is now 4-1/2 years out from definitive diagnosis of metastatic breast cancer, with no symptoms suggestive of progressive disease.  She is tolerating letrozole well and the plan is to continue it until the pandemic subsides at which time we can consider going back to fulvestrant and palbociclib  I do not think it is a good idea to restage her until she gets her vaccine and she is very reluctant to proceed with that.  I will give her a call in February.  Likely we will set her up for restaging studies and a visit in April.  She knows to call for any other issue that may develop before the next visit.  Chukwudi Ewen, [name], MD  05/17/19 11:06 AM Medical Oncology and Hematology [facility] [place], [place]  [place] Tel. [phone]    Fax. [phone]   I, Wilburn Mylar, am acting as scribe for Dr. [name]. Deem Marmol.  I, Lurline Del MD, have reviewed the above documentation for accuracy and completeness, and I agree with the above.

## 2019-05-17 ENCOUNTER — Inpatient Hospital Stay: Payer: Medicare Other | Attending: Oncology | Admitting: Oncology

## 2019-05-17 DIAGNOSIS — M818 Other osteoporosis without current pathological fracture: Secondary | ICD-10-CM

## 2019-05-17 DIAGNOSIS — C7951 Secondary malignant neoplasm of bone: Secondary | ICD-10-CM | POA: Diagnosis present

## 2019-05-17 DIAGNOSIS — Z170 Estrogen receptor positive status [ER+]: Secondary | ICD-10-CM | POA: Diagnosis not present

## 2019-05-17 DIAGNOSIS — C50919 Malignant neoplasm of unspecified site of unspecified female breast: Secondary | ICD-10-CM

## 2019-05-17 DIAGNOSIS — C50412 Malignant neoplasm of upper-outer quadrant of left female breast: Secondary | ICD-10-CM

## 2019-05-18 ENCOUNTER — Telehealth: Payer: Self-pay | Admitting: Oncology

## 2019-05-18 NOTE — Telephone Encounter (Signed)
Talk with patient regarding visit °

## 2019-06-08 ENCOUNTER — Telehealth: Payer: Self-pay

## 2019-06-08 NOTE — Telephone Encounter (Signed)
Spoke with patient to inform that it is ok for her to receive the Covid vaccine per NP after reviewing chart.  Patient voiced understanding and thanks for return call.

## 2019-06-08 NOTE — Telephone Encounter (Signed)
-----   Message from Gardenia Phlegm, NP sent at 06/08/2019  1:48 PM EST ----- Regarding: RE: Covid vaccine Yes.  Would recommend it.   ----- Message ----- From: Juliane Poot, LPN Sent: [phone]   1:22 PM EST To: Gardenia Phlegm, NP Subject: Covid vaccine                                  Patient left vm asking if it is ok to get the Covid vaccine?  LB

## 2019-06-15 ENCOUNTER — Ambulatory Visit: Payer: Self-pay | Admitting: Adult Health

## 2019-07-18 NOTE — Progress Notes (Signed)
April Rosales  Telephone:([phone]) [phone] Fax:(336) [phone]    ID: April Rosales DOB: 1980-04-27  MR#: [id number]  ENM#:[id number]  Patient Care Team: [name], MD as PCP - General (Internal Medicine) Sharmarke Cicio, [name], MD as Consulting Physician (Oncology) [name], MD as Consulting Physician (Orthopedic Surgery) [name], MD as Consulting Physician (General Surgery) OTHER: [name], DDS  I connected with [name] on 07/18/19 at 11:00 AM EST by telephone visit and verified that I am speaking with the correct person using two identifiers.   I discussed the limitations, risks, security and privacy concerns of performing an evaluation and management service by telemedicine and the availability of in-person appointments. I also discussed with the patient that there may be a patient responsible charge related to this service. The patient expressed understanding and agreed to proceed.   Other persons participating in the visit and their role in the encounter: None  Patient's location: home  Provider's location: [place]: Stage IV estrogen receptor positive breast cancer  CURRENT TREATMENT:  [ Fulvestrant, denosumab/Xgeva ]; letrozole   INTERVAL HISTORY: April Rosales was contacted today for follow-up of her stage IV estrogen receptor positive breast cancer.    We are holding the fulvestrant and palbociclib because of the pandemic.  Her last fulvestrant dose was 07/22/2018.  This qualifies her for the [facility] PET scan  She was placed on letrozole when we stop the fulvestrant and she is tolerating this well, with no hot flashes no vaginal dryness and no arthralgias or myalgias.   REVIEW OF SYSTEMS:  Yaniah is "not going anywhere".  She talks with friends and family daily but "I do not see anybody". She does not want to get the Covid vaccine until it becomes more widely available so there are no crowds.   Review of systems today was otherwise entirely stable  BREAST CANCER HISTORY: From the original intake note:   April Rosales underwent right lumpectomy in 1985 at [facility] for what sounds like a stage I breast cancer. She tells me she had more than 30 lymph nodes removed from her right axilla and all of them were clear. She received  adjuvant radiation but no systemic treatment.  The patient had recently refused mammography with the last mammogram I can find dating back to August 2010.  More recently the patient presented with left scapular pain radiating down the left arm.  She was evaluated by Dr. [name], who obtained a chest x-ray showing a possible abnormality at T3. He then set up the patient for an MRI of the thoracic spine performed 08/16/2014.  This showed multiple compression fractures  (a similar picture had been noted on lumbar MRI 10/09/2011 ). However at T3 they noted tumor in the vertebral body extending into the left pedicle and into the lateral epidural space, displacing the cord to the right. There was no evidence of cord compression or cord signal abnormality. There were no other areas of tumor identified in the thoracic spine.         The patient was then referred to Dr. Hal Neer who on 08/24/2014 set [name] up for CT scans of the chest, abdomen and pelvis. There was a dense mass in the upper inner quadrant of the left breast measuring 1.6 cm. There was a 1.2 cm nodule in the minor fissure of the right lung and some evidence of right lung fibrosis at the site of the prior radiation port.  There was also a thyroid mass measuring 2 cm. However there were no parenchymal lung or liver lesions. Incidental meningoceles were noted as well as sclerosis of the fifth and sixth ribs which were felt to be likely posttraumatic.   On 08/29/2014 the patient underwent bilateral diagnostic mammography with tomosynthesis and left breast ultrasonography.  There were postsurgical changes in the  upper right breast. In the left breast there was an irregular mass measuring 2.3 cm in the upper inner quadrant. This was palpable. Ultrasound showed this to be hypoechoic and to measure 2.0 cm. There were adjacent areas of nodularity. There was no definite lymphadenopathy in the left axilla.  Biopsy of this breast mass 08/29/2014 showed an invasive adenocarcinoma with both ductal and lobular features (there was strong diffuse E-cadherin expression as well as areas with total absence of E-cadherin expression), with the preliminary prognostic profile showing strong estrogen positivity, very weak to near absent progesterone positivity, an MIB-1 of approximately 40%, and HER-2 equivocal  The patient's subsequent history is as detailed below.   PAST MEDICAL HISTORY: Past Medical History:  Diagnosis Date  . Arthritis   . Breast cancer [facility]) 1985/2016   takes [name] daily  . Chronic back pain    stenosis/listhesis  . Family history of ovarian cancer   . Osteoporosis    takes Vit D  . Radiation 11/23/14-12/11/14   30 Gy T1-T5     PAST SURGICAL HISTORY: Past Surgical History:  Procedure Laterality Date  . ABDOMINAL HYSTERECTOMY     1980  . APPENDECTOMY  1977  . APPLICATION OF INTRAOPERATIVE CT SCAN N/A 10/20/2014   Procedure: APPLICATION OF INTRAOPERATIVE CAT SCAN;  Surgeon: [name], MD;  Location: [place] NEURO ORS;  Service: Neurosurgery;  Laterality: N/A;  . BREAST LUMPECTOMY WITH RADIOACTIVE SEED LOCALIZATION Left 05/03/2018   Procedure: LEFT BREAST LUMPECTOMY WITH BRACKETED RADIOACTIVE SEED LOCALIZATION;  Surgeon: [name], MD;  Location: [place];  Service: General;  Laterality: Left;  . BREAST SURGERY Right 1985  . THYROID CYST EXCISION  1967    FAMILY HISTORY Family History  Problem Relation Age of Onset  . Heart disease Mother   . Hypertension Mother   . Heart disease Father   . Diabetes Father   . Breast cancer Paternal Aunt        4 paternal aunts  with breast cancer over 10  . Prostate cancer Paternal Uncle   . Stroke Paternal Grandfather   . Ovarian cancer Paternal Aunt   . Huntington's disease Other        Nephew, inherited from his father   The patient's father died from a heart attack at the age of 80. He had 9 sisters. 3 of those sisters had breast cancer, all in a menopausal setting. Another sister had ovarian cancer. One of the paternal uncles had cancer of the colon "and back".  The patient's mother died at the age of 58. She was found to have breast cancer shortly before dying , during her final hospitalization.   GYNECOLOGIC HISTORY:  No LMP recorded. Patient has had a hysterectomy.  Menarche age 65, first live birth age 67, the patient is GX P1. She underwent hysterectomy in 1980. She thinks the ovaries were removed, but the CT scan obtained 08/24/2014 showed a definite right ovary. The left ovary may have been removed. She did not take hormone replacement after the hysterectomy.   SOCIAL HISTORY:   Cybele worked in Psychiatric nurse. She is divorced, lives alone, with  2 cats. Her son Bethann Berkshire lives in [place] where he works in Engineer, technical sales. He has 4 children of his own. The patient is a Psychologist, forensic.   ADVANCED DIRECTIVES:  In place. The patient has named her sister Pleas Koch [phone]) and her friend Janina Mayo [phone]) as joint healthcare powers of attorney   HEALTH MAINTENANCE: Social History   Tobacco Use  . Smoking status: Former Smoker    Packs/day: 0.50    Years: 10.00    Pack years: 5.00    Types: Cigarettes    Quit date: 05/03/1970    Years since quitting: 49.2  . Smokeless tobacco: Former Systems developer  . Tobacco comment: quit smoking 1970's  Substance Use Topics  . Alcohol use: Yes    Alcohol/week: 0.0 standard drinks    Comment: Rare  . Drug use: No     Colonoscopy: never  PAP: status post hysterectomy  Bone density: 09/12/2016 [facility]/ T score of -4.8 osteoporosis  Lipid  panel:  Allergies  Allergen Reactions  . Ativan [Lorazepam]     Made her crazy  . Dilaudid [Hydromorphone Hcl]     Doesn't want  . Haldol [Haloperidol Lactate]     Made her crazy    Current Outpatient Medications  Medication Sig Dispense Refill  . Ascorbic Acid (VITAMIN C PO) Take 1 tablet by mouth every other day. Takes qod    . aspirin EC 325 MG tablet Take 1 tablet (325 mg total) by mouth daily. 30 tablet 0  . Cholecalciferol (VITAMIN D3 ADULT GUMMIES PO) Take 2,000 Units by mouth daily.     . Cyanocobalamin (VITAMIN B-12 PO) Take by mouth daily. Takes every other day    . Iron-Vitamins (GERITOL PO) Take by mouth daily.    Marland Kitchen letrozole (FEMARA) 2.5 MG tablet Take 1 tablet (2.5 mg total) by mouth daily. 90 tablet 4   No current facility-administered medications for this visit.    OBJECTIVE: Older white woman   There were no vitals filed for this visit. Wt Readings from Last 3 Encounters:  02/08/19 121 lb 9.6 oz (55.2 kg)  06/24/18 122 lb 6.4 oz (55.5 kg)  06/10/18 123 lb 3.2 oz (55.9 kg)   There is no height or weight on file to calculate BMI.    ECOG FS:1 - Symptomatic but completely ambulatory  Telehealth Visit 07/18/2019   LAB RESULTS:  CMP     Component Value Date/Time   NA 141 02/08/2019 1145   NA 141 05/29/2017 1417   K 4.3 02/08/2019 1145   K 3.6 05/29/2017 1417   CL 104 02/08/2019 1145   CO2 28 02/08/2019 1145   CO2 27 05/29/2017 1417   GLUCOSE 84 02/08/2019 1145   GLUCOSE 111 05/29/2017 1417   BUN 13 02/08/2019 1145   BUN 17.0 05/29/2017 1417   CREATININE 0.68 02/08/2019 1145   CREATININE 0.8 05/29/2017 1417   CALCIUM 10.4 02/08/2019 1145   CALCIUM 10.7 (H) 06/26/2017 1216   CALCIUM 10.4 05/29/2017 1417   PROT 7.0 02/08/2019 1145   PROT 7.3 05/29/2017 1417   ALBUMIN 3.8 12/29/2018 1411   ALBUMIN 3.9 05/29/2017 1417   AST 19 02/08/2019 1145   AST 17 05/29/2017 1417   ALT 13 02/08/2019 1145   ALT 15 05/29/2017 1417   ALKPHOS 38 12/29/2018  1411   ALKPHOS 42 05/29/2017 1417   BILITOT 0.4 02/08/2019 1145   BILITOT 0.30 05/29/2017 1417   GFRNONAA 83 02/08/2019 1145   GFRAA 96 02/08/2019 1145    INo  results found for: SPEP, UPEP  Lab Results  Component Value Date   WBC 5.4 12/29/2018   NEUTROABS 3.5 12/29/2018   HGB 14.9 12/29/2018   HCT 44.3 12/29/2018   MCV 92.7 12/29/2018   PLT 246 12/29/2018      Chemistry      Component Value Date/Time   NA 141 02/08/2019 1145   NA 141 05/29/2017 1417   K 4.3 02/08/2019 1145   K 3.6 05/29/2017 1417   CL 104 02/08/2019 1145   CO2 28 02/08/2019 1145   CO2 27 05/29/2017 1417   BUN 13 02/08/2019 1145   BUN 17.0 05/29/2017 1417   CREATININE 0.68 02/08/2019 1145   CREATININE 0.8 05/29/2017 1417      Component Value Date/Time   CALCIUM 10.4 02/08/2019 1145   CALCIUM 10.7 (H) 06/26/2017 1216   CALCIUM 10.4 05/29/2017 1417   ALKPHOS 38 12/29/2018 1411   ALKPHOS 42 05/29/2017 1417   AST 19 02/08/2019 1145   AST 17 05/29/2017 1417   ALT 13 02/08/2019 1145   ALT 15 05/29/2017 1417   BILITOT 0.4 02/08/2019 1145   BILITOT 0.30 05/29/2017 1417       Lab Results  Component Value Date   LABCA2 24 01/01/2016    No components found for: PRFFM384  No results for input(s): INR in the last 168 hours.  Urinalysis    Component Value Date/Time   COLORURINE YELLOW 02/08/2019 1145   APPEARANCEUR CLOUDY (A) 02/08/2019 1145   LABSPEC 1.009 02/08/2019 1145   PHURINE 8.0 02/08/2019 1145   GLUCOSEU NEGATIVE 02/08/2019 1145   HGBUR NEGATIVE 02/08/2019 1145   BILIRUBINUR NEGATIVE 09/19/2015 1239   KETONESUR NEGATIVE 02/08/2019 1145   PROTEINUR NEGATIVE 02/08/2019 1145   NITRITE NEGATIVE 02/08/2019 1145   LEUKOCYTESUR 2+ (A) 02/08/2019 1145    STUDIES: No results found.   ASSESSMENT: 79 y.o. [place] woman with stage IV left breast cancer involving bone  (1) status post right lumpectomy and axillary node dissection in 1985 followed by radiation at [facility] 08/16/2014: measurable disease in spine, lung and left breast   (2) evaluation for left shoulder pain led to thoracic spine MRI 08/16/2014 showing a pathologic fracture at T3 with epidural tumor displacing the cord to the right, but no cord compression. CT scans of the chest, abdomen and pelvis 08/24/2014 showed in addition a mass in the upper outer quadrant left breast measuring 1.6 cm and a nodule in the minor fissure of the right lung measuring 1.2 cm, but no parenchymal lung or liver lesions.   (a) CA 27-29 was noninformative at 38 (09/20/2014)    (3) mammography and ultrasonography 08/29/2014 show a mass in the upper inner left breast which was palpable,  measuring 2.0 cm by ultrasound. Biopsy of this mass 08/29/2014 showed an invasive breast cancer with both lobular and ductal features, estrogen receptor positive, progesterone receptor weakly positive, with an MIB-1 in the 40% range, HER-2 equivocal (6 else ratio 1.5, but average number her nucleus 5.8)   (4) letrozole started 08/31/2014;   (a) palbociclib added Sept 2016 at 75 mg 21/7, with significant neutropenia; not repeated after first cycle  (b) letrozole discontinued 05/29/2017 with evidence of disease progression in the breast   (5)  zolendronate started 09/20/2014, stopped after initial dose due to poor tolerance  (a) denosumab/ Xgeva started 01/31/2015, repeated every 4 weeks  (b) changed to every 8 weeks as of August 2018.    (6) on 10/20/2014 the patient underwent  T2-T3 and T4 decompressive laminectomy with removal of epidural tumor, C7-T4 segmental pedicle screw instrumentation with virage screw system with arrow guidance protocol and C7-T4 posterolateral fusion. The cells were positive for the estrogen receptor. HER-2/neu testing by [facility] showed again equivocal results,  (a) most recent bone scan 10/01/2016 finds no new lesions only postoperative changes  (7) radiation 11/23/2014-12/11/2014.  (a) T1-T5 was treated to 30 Gy  in 12 fractions at 2.5 Gy per fraction   (8) initiated close follow-up while considering eventual left lumpectomy or mastectomy depending on  longer-term results of systemic therapy  (a) most recent left breast ultrasonography 10/30/2016 found this mass to measure 0.7 cm  (b) repeat left breast ultrasonography 05/13/2017 showed the upper outer quadrant mass to have grown to 1.4 cm  (c) left breast ultrasound 09/08/2017 shows the mass to now measure 0.9 cm  (d) left breast biopsy x2 on 12/11/2017 shows high-grade ductal carcinoma in situ, essentially estrogen receptor negative  (9) genetics testing using the Breast/Ovarian Cancer Panel through [facility] [name], MD) found no deleterious mutations in ATM, BARD1, BRCA1, BRCA2, BRIP1, CDH1, CHEK2, EPCAM, FANCC, MLH1, MSH2, MSH6, NBN, PALB2, PMS2, PTEN, RAD51C, RAD51D, STK11, TP53, or XRCC2    (10) right thyroid nodule is a complex cyst as noted on CT scan of the neck 01/29/2016  (11) osteoporosis: Bone density at [facility] 09/12/2016 showed a T score of -4.8.  (a) continue denosumab/Xgeva as above  (12) fulvestrant started 05/29/2017, last dose 07/22/2018 (discontinued due to pandemic).  (a) started palbociclib 06/29/2017 at 75 mg every other day for 21 days on, 7 days off  (b) palbociclib discontinued on 3/29 due to progressive fatigue and patient preference  (13)  was to start abemaciclib 02/04/2018, but patient opted for going back to palbociclib at a lower dose beginning in November 19, patient subsequently declined s palbociclib, and  proceeded to surgery  (14) status post left lumpectomy 05/03/2018 showing a pT1b pNX invasive ductal carcinoma, grade 2, with equivocal HER-2 results  (a) opted against adjuvant radiation given presence of stage IV disease  (15) tamoxifen supposed to have been started started 09/13/2018 as a "bridge" pending resumption of fulvestrant/denosumab, but never started by the patient  (16) PET scan 11/02/2018 showed  no active disease  (17) letrozole started 02/15/2019  (a) bone density 09/12/2016 showed a T score of -4.8.   PLAN: Mannat is now nearly 5 years from diagnosis of metastatic breast cancer, with no clinical evidence of disease.  This is very favorable.  She is tolerating letrozole well and the plan is to continue that indefinitely.  She does have osteoporosis and we were treating her with denosumab/Xgeva until a year ago, but the pandemic hit and she opted not to come here for treatments.  That was when we switched her to letrozole.  At this point as she will need restaging.  She is not quite ready to receive been that COVID-19 shots yet.  She probably will receive it in April or May.  I am setting her up for a ceriana scan in June and to see me shortly after that.  We will probably set her up for mammography and a repeat bone scan sometime later in the year.  She knows to call for any other issue that may develop before the next visit  Total encounter time 20 minutes.* Dimitri Dsouza, [name], MD  07/18/19 12:33 PM Medical Oncology and Hematology [facility] [place], [place] [address] Tel. [phone]  Fax. [phone]   I, Wilburn Mylar, am acting as scribe for Dr. [name]. Nafisah Runions.  I, Lurline Del MD, have reviewed the above documentation for accuracy and completeness, and I agree with the above.   *Total Encounter Time as defined by the Centers for Medicare and Medicaid Services includes, in addition to the face-to-face time of a patient visit (documented in the note above) non-face-to-face time: obtaining and reviewing outside history, ordering and reviewing medications, tests or procedures, care coordination (communications with other health care professionals or caregivers) and documentation in the medical record.

## 2019-07-19 ENCOUNTER — Inpatient Hospital Stay: Payer: Medicare Other | Attending: Oncology | Admitting: Oncology

## 2019-07-19 DIAGNOSIS — G629 Polyneuropathy, unspecified: Secondary | ICD-10-CM | POA: Diagnosis not present

## 2019-07-19 DIAGNOSIS — Z170 Estrogen receptor positive status [ER+]: Secondary | ICD-10-CM | POA: Diagnosis not present

## 2019-07-19 DIAGNOSIS — M818 Other osteoporosis without current pathological fracture: Secondary | ICD-10-CM

## 2019-07-19 DIAGNOSIS — R0989 Other specified symptoms and signs involving the circulatory and respiratory systems: Secondary | ICD-10-CM | POA: Diagnosis not present

## 2019-07-19 DIAGNOSIS — C7951 Secondary malignant neoplasm of bone: Secondary | ICD-10-CM | POA: Diagnosis not present

## 2019-07-19 DIAGNOSIS — C50412 Malignant neoplasm of upper-outer quadrant of left female breast: Secondary | ICD-10-CM | POA: Diagnosis not present

## 2019-07-19 DIAGNOSIS — C50919 Malignant neoplasm of unspecified site of unspecified female breast: Secondary | ICD-10-CM | POA: Diagnosis not present

## 2019-07-20 ENCOUNTER — Telehealth: Payer: Self-pay | Admitting: Oncology

## 2019-07-20 NOTE — Telephone Encounter (Signed)
I talk with patient regarding schedule  

## 2019-09-26 DIAGNOSIS — I700 Atherosclerosis of aorta: Secondary | ICD-10-CM | POA: Insufficient documentation

## 2019-09-26 NOTE — Progress Notes (Deleted)
MEDICARE ANNUAL WELLNESS VISIT AND FOLLOW UP  Assessment:    Encounter for annual medicare wellness visit   Atherosclerosis of aorta Per PET 10/2018 Control blood pressure, cholesterol, glucose, increase exercise.   Labile hypertension - continue medications, DASH diet, exercise and monitor at home. Call if greater than 130/80.   Other abnormal glucose Recent A1Cs at goal Discussed diet/exercise, weight management  Defer A1C; check CMP  Hyperlipidemia -Mild elevations not aggressively pursued d/t age and metastatic cancer diagnosis - check lipids, continue to encouraged lifestyle - decrease fatty foods, increase activity.   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 Continue follow up  Metastasis to bone [facility]) Continue follow up  Osteoporosis  Continue medications via oncology   Vitamin D deficiency At goal at recent check; continue to recommend supplementation for goal of 70-100 Defer vitamin D level  Medication management Gets CBC, CMP monthly via oncology, defer all labs today   Breast cancer metastasized to multiple sites, left [facility]) Continue follow up   Over 30 minutes of exam, counseling, chart review, and critical decision making was performed Future Appointments  Date Time Provider [place]  09/27/2019 10:45 AM Liane Comber, NP [facility] None  11/16/2019 11:00 AM [facility] LAB 2 [facility] None  11/16/2019 11:30 AM Magrinat, [name], MD [facility] None  03/05/2020 11:00 AM [name], MD [facility] None    Plan:   During the course of the visit the patient was educated and counseled about appropriate screening and preventive services including:    Pneumococcal vaccine   Influenza vaccine  Td vaccine  Prevnar 13  Screening electrocardiogram  Screening mammography  Bone densitometry screening  Colorectal cancer screening  Diabetes  screening  Glaucoma screening  Nutrition counseling   Advanced directives: given info/requested copies   Subjective:   April Rosales is a 79 y.o. female who presents for Medicare Annual Wellness Visit and 3 month follow up on hypertension, prediabetes, hyperlipidemia, vitamin D def.   She has stage IV breast cancer. 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 Dalbert Batman in 04/2018. She underwent radiation and chemotherapy, followed by Dr. Jana Hakim. Holding the fulvestrant and palbociclib because of the pandemic, continues on femara.  Her last fulvestrant dose was 07/22/2018. PET in 10/2018 showed no hypermetabolic areas.   BMI is There is no height or weight on file to calculate BMI., she has not been working on diet and exercise. Wt Readings from Last 3 Encounters:  02/08/19 121 lb 9.6 oz (55.2 kg)  06/24/18 122 lb 6.4 oz (55.5 kg)  06/10/18 123 lb 3.2 oz (55.9 kg)   She has aortic atherosclerosis per PET 10/2018.  Her blood pressure has been controlled at home, today their BP is   She does not workout. She denies chest pain, shortness of breath, dizziness.   She is not on cholesterol medication and denies myalgias. Her cholesterol is not at goal. Not pursued secondary to age and metastatic cancer. The cholesterol last visit was:   Lab Results  Component Value Date   CHOL 184 11/25/2017   HDL 55 11/25/2017   LDLCALC 109 (H) 11/25/2017   TRIG 100 11/25/2017   CHOLHDL 3.3 11/25/2017   She has been working on diet and exercise for glucose management, and denies paresthesia of the feet,  polydipsia, polyuria and visual disturbances. Last A1C in the office was:  Lab Results  Component Value Date   HGBA1C 5.3 02/08/2019    Last GFR:  Lab Results  Component Value Date   GFRNONAA 83 02/08/2019   Patient is on Vitamin D  supplement. Lab Results  Component Value Date   VD25OH 64 02/08/2019      Medication Review Current Outpatient Medications on File Prior to Visit  Medication Sig Dispense Refill  . Ascorbic Acid (VITAMIN C PO) Take 1 tablet by mouth every other day. Takes qod    . aspirin EC 325 MG tablet Take 1 tablet (325 mg total) by mouth daily. 30 tablet 0  . Cholecalciferol (VITAMIN D3 ADULT GUMMIES PO) Take 2,000 Units by mouth daily.     . Cyanocobalamin (VITAMIN B-12 PO) Take by mouth daily. Takes every other day    . Iron-Vitamins (GERITOL PO) Take by mouth daily.    Marland Kitchen letrozole (FEMARA) 2.5 MG tablet Take 1 tablet (2.5 mg total) by mouth daily. 90 tablet 4   No current facility-administered medications on file prior to visit.    Current Problems (verified) Patient Active Problem List   Diagnosis Date Noted  . Neuropathy 03/27/2016  . Malignant neoplasm of upper-outer quadrant of left breast in female, estrogen receptor positive ([place]) 09/11/2015  . BMI 22.0-22.9, adult 03/14/2015  . Metastasis to bone ([place]) 10/20/2014  . Breast cancer metastasized to multiple sites ([place]) 08/28/2014  . 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 Medication management 07/07/2013  . Labile hypertension   . Hyperlipidemia   . Other abnormal glucose   . Vitamin D deficiency   . Osteoporosis   . IBS (irritable bowel syndrome)     Screening Tests Immunization History  Administered Date(s) Administered  . Influenza Split 02/07/2013, 02/27/2017  . Influenza, High Dose Seasonal PF 03/14/2015, 03/24/2016, 03/05/2018  . Td 01/05/2008    Preventative care: Last colonoscopy: Never, declines all screening  Last mammogram: 11/2017 *** MRI breast 02/2018 Last pap smear/pelvic exam: s/p TAH, DONE DEXA: 08/2016 - osteoporosis - no longer on treatment  CT Chest 06/2015 CT AB 07/2014  Prior vaccinations: TD or Tdap: 2009 - declines Influenza: 2019   Pneumococcal: declines Prevnar13: declines Shingles/Zostavax: declines  Names of Other Physician/Practitioners you currently use: 1. [facility]- here for primary care 2. NONE, eye doctor, declines, readers and does well  3. unknown, dentist, last visit 02/2018, goes q46m  Patient Care Team: [name], MD as PCP - General (Internal Medicine) Magrinat, [name], MD as Consulting Physician (Oncology) [name], MD as Consulting Physician (Orthopedic Surgery) [name], MD as Consulting Physician (General Surgery)  Past Surgical History:  Procedure Laterality Date  . ABDOMINAL HYSTERECTOMY     1980  . APPENDECTOMY  1977  . APPLICATION OF INTRAOPERATIVE CT SCAN N/A 10/20/2014   Procedure: APPLICATION OF INTRAOPERATIVE CAT SCAN;  Surgeon: [name], MD;  Location: [facility];  Service: Neurosurgery;  Laterality: N/A;  . BREAST LUMPECTOMY WITH RADIOACTIVE SEED LOCALIZATION Left 05/03/2018   Procedure: LEFT BREAST LUMPECTOMY WITH BRACKETED RADIOACTIVE SEED LOCALIZATION;  Surgeon: [name], MD;  Location: [facility];  Service: General;  Laterality: Left;  . BREAST SURGERY Right 1985  . THYROID CYST EXCISION  1967   Family History  Problem Relation Age of Onset  . Heart disease Mother   . Hypertension Mother   . Heart disease  Father   . Diabetes Father   . Breast cancer Paternal Aunt        4 paternal aunts with breast cancer over 71  . Prostate cancer Paternal Uncle   . Stroke Paternal Grandfather   . Ovarian cancer Paternal Aunt   . Huntington's disease Other        Nephew, inherited from his father   Social History   Tobacco Use  . Smoking status: Former Smoker    Packs/day: 0.50    Years: 10.00    Pack years: 5.00    Types: Cigarettes    Quit date: 05/03/1970    Years since quitting: 49.4  . Smokeless tobacco: Former Systems developer  . Tobacco comment: quit smoking 1970's  Substance Use Topics  .  Alcohol use: Yes    Alcohol/week: 0.0 standard drinks    Comment: Rare  . Drug use: No    MEDICARE WELLNESS OBJECTIVES: Tobacco use: She does not smoke.  Patient is a former smoker. If yes, counseling given Alcohol Current alcohol use: none Osteoporosis: postmenopausal estrogen deficiency, dietary calcium and/or vitamin D deficiency and amenorrhea, History of fracture in the past year: yes *** Diet: in general, a "healthy" diet   Physical activity:   Cardiac risk factors:   Depression/mood screen:   Depression screen [facility] 2/9 02/07/2019  Decreased Interest 0  Down, Depressed, Hopeless 0  PHQ - 2 Score 0  Some recent data might be hidden    ADLs:  In your present state of health, do you have any difficulty performing the following activities: 02/07/2019  Hearing? N  Vision? N  Difficulty concentrating or making decisions? N  Walking or climbing stairs? N  Dressing or bathing? N  Doing errands, shopping? N  Some recent data might be hidden     Cognitive Testing  Alert? Yes  Normal Appearance?Yes  Oriented to person? Yes  Place? Yes   Time? Yes  Recall of three objects?  Yes  Can perform simple calculations? Yes  Displays appropriate judgment?Yes  Can read the correct time from a watch face?Yes  EOL planning:     Objective:   There were no vitals filed for this visit. There is no height or weight on file to calculate BMI.  General appearance: alert, no distress, WD/WN,  female HEENT: normocephalic, sclerae anicteric, TMs pearly, nares patent, no discharge or erythema, pharynx normal, no thrush, + maxillary tenderness to palpation Oral cavity: MMM, no lesions Neck: supple, no lymphadenopathy, no thyromegaly, no masses Heart: RRR, normal S1, S2, no murmurs Lungs: CTA bilaterally, no wheezes, rhonchi, or rales Abdomen: +bs, soft, non tender, non distended, no masses, no hepatomegaly, no splenomegaly Musculoskeletal: cervical/throacic kyphosis, nontender, no swelling, no  obvious deformity Extremities: no edema, no cyanosis, no clubbing Pulses: 2+ symmetric, upper and lower extremities, normal cap refill Neurological: alert, oriented x 3, CN2-12 intact, strength normal upper extremities and lower extremities, sensation normal throughout, DTRs 2+ throughout, no cerebellar signs, gait normal Psychiatric: normal affect, behavior normal, pleasant  Breast: defer Gyn: defer Rectal: defer   Medicare Attestation I have personally reviewed: The patient's medical and social history Their use of alcohol, tobacco or illicit drugs Their current medications and supplements The patient's functional ability including ADLs,fall risks, home safety risks, cognitive, and hearing and visual impairment Diet and physical activities Evidence for depression or mood disorders  The patient's weight, height, BMI, and visual acuity have been recorded in the chart.  I have made referrals, counseling, and provided education to  the patient based on review of the above and I have provided the patient with a written personalized care plan for preventive services.     Izora Ribas, NP   09/26/2019

## 2019-09-27 ENCOUNTER — Ambulatory Visit: Payer: Self-pay | Admitting: Adult Health

## 2019-10-03 ENCOUNTER — Encounter: Payer: Self-pay | Admitting: Adult Health

## 2019-10-03 ENCOUNTER — Other Ambulatory Visit: Payer: Self-pay

## 2019-10-03 ENCOUNTER — Ambulatory Visit (INDEPENDENT_AMBULATORY_CARE_PROVIDER_SITE_OTHER): Payer: Medicare Other | Admitting: Adult Health

## 2019-10-03 ENCOUNTER — Ambulatory Visit
Admission: RE | Admit: 2019-10-03 | Discharge: 2019-10-03 | Disposition: A | Discharge status: Home / Self Care | Payer: Medicare Other | Source: Ambulatory Visit | Attending: Adult Health | Admitting: Adult Health

## 2019-10-03 VITALS — BP 136/78 | HR 88 | Temp 97.6°F | Resp 16 | Ht 61.5 in | Wt 124.8 lb

## 2019-10-03 DIAGNOSIS — R339 Retention of urine, unspecified: Secondary | ICD-10-CM

## 2019-10-03 DIAGNOSIS — R102 Pelvic and perineal pain: Secondary | ICD-10-CM

## 2019-10-03 DIAGNOSIS — M545 Low back pain, unspecified: Secondary | ICD-10-CM

## 2019-10-03 DIAGNOSIS — G8929 Other chronic pain: Secondary | ICD-10-CM

## 2019-10-03 DIAGNOSIS — N949 Unspecified condition associated with female genital organs and menstrual cycle: Secondary | ICD-10-CM | POA: Diagnosis not present

## 2019-10-03 DIAGNOSIS — R34 Anuria and oliguria: Secondary | ICD-10-CM

## 2019-10-03 DIAGNOSIS — S32010A Wedge compression fracture of first lumbar vertebra, initial encounter for closed fracture: Secondary | ICD-10-CM | POA: Diagnosis not present

## 2019-10-03 DIAGNOSIS — C7951 Secondary malignant neoplasm of bone: Secondary | ICD-10-CM | POA: Diagnosis not present

## 2019-10-03 DIAGNOSIS — Z90710 Acquired absence of both cervix and uterus: Secondary | ICD-10-CM

## 2019-10-03 DIAGNOSIS — C50919 Malignant neoplasm of unspecified site of unspecified female breast: Secondary | ICD-10-CM | POA: Diagnosis not present

## 2019-10-03 DIAGNOSIS — N2889 Other specified disorders of kidney and ureter: Secondary | ICD-10-CM | POA: Diagnosis not present

## 2019-10-03 NOTE — Patient Instructions (Signed)
  Go to the ER if you have any new weakness in your legs, have trouble controlling your urine or bowels, STOP PRODUCING URINE or have worsening pain.   Please monitor fluid intake and urine output     Acute Urinary Retention, Female  Acute urinary retention means that you cannot pee (urinate) at all, or that you pee too little and your bladder is not emptied completely. If it is not treated, it can lead to kidney damage or other serious problems. Follow these instructions at home:  Take over-the-counter and prescription medicines only as told by your doctor. Ask your doctor what medicines you should stay away from. Do not take any medicine unless your doctor says it is okay to do so.  If you were sent home with a tube that drains pee from the bladder (catheter), take care of it as told by your doctor.  Drink enough fluid to keep your pee clear or pale yellow.  If you were given an antibiotic, take it as told by your doctor. Do not stop taking the antibiotic even if you start to feel better.  Do not use any products that contain nicotine or tobacco, such as cigarettes and e-cigarettes. If you need help quitting, ask your doctor.  Watch for changes in your symptoms. Tell your doctor about them.  If told, keep track of any changes in your blood pressure at home. Tell your doctor about them.  Keep all follow-up visits as told by your doctor. This is important. Contact a doctor if:  You have spasms or you leak pee when you have spasms. Get help right away if:  You have chills or a fever.  You have blood in your pee.  You have a tube that drains the bladder and: ? The tube stops draining pee. ? The tube falls out. Summary  Acute urinary retention means that you cannot pee at all, or that you pee too little and your bladder is not emptied completely. If it is not treated, it can result in kidney damage or other serious problems.  If you were sent home with a tube that drains pee  from the bladder, take care of it as told by your doctor.  Pay attention to any changes in your symptoms. Tell your doctor about them. This information is not intended to replace advice given to you by your health care provider. Make sure you discuss any questions you have with your health care provider. Document Revised: 04/24/2017 Document Reviewed: 06/13/2016 Elsevier Patient Education  [place].

## 2019-10-03 NOTE — Progress Notes (Signed)
Assessment and Plan:  April Rosales was seen today for urinary urgency.  Diagnoses and all orders for this visit:  Perineal discomfort in female Pelvic pressure in female Decreased urine output S/P hysterectomy Breast cancer with mets to T3 with epidural tumor displacing spinal cord HPI suggests urinary retension though without distended/palpable bladder R/o UTI - abx as indicated Notably has chronic intermittent lumbar midline pain, breast cancer with mets to T3, also osteoporosis, PET in 10/2018 showed no hypermetabolic areas, however ? Neurogenic etiology - will obtain US for pelvis, KUB, XR for lumbar; consider MRI lumbar if significant changes on plain films and urinary sx otherwise not explained by labs/UA/US results Given hat, advised monitor input/output Go to the ER if you have any new weakness in your legs, have trouble controlling your urine or bowels, NO URINE OUTPUT or have worsening pain.  Pelvic was not completed today due to age, back pain and very small introitus, limited ability to tolerate; If suggestive of pelvic organ prolapse will refer to uro/gyn Call office with any new concerning sx -     Urinalysis, Routine w reflex microscopic -     Urine Culture -     CBC with Differential/Platelet -     BASIC METABOLIC PANEL WITH GFR -     US renal -     US pelvis complete (transabdominal only)  Chronic midline low back pain without sciatica -     DG Lumbar Spine Complete; Future  Further disposition pending results of labs. Discussed med's effects and SE's.   Over 15 minutes of exam, counseling, chart review, and critical decision making was performed.   Future Appointments  Date Time Provider [place]  11/16/2019 11:00 AM [facility] LAB 2 [facility] None  11/16/2019 11:30 AM Magrinat, [name], MD [facility] None  03/05/2020 11:00 AM [name], MD [facility] None     ------------------------------------------------------------------------------------------------------------------   HPI BP 136/78   Pulse 88   Temp 97.6 F (36.4 C)   Resp 16   Ht 5' 1.5" (1.562 m)   Wt 124 lb 12.8 oz (56.6 kg)   SpO2 97%   BMI 23.20 kg/m   79 y.o.female with metastatic breast cancer to T3 vertabrae with epidural tumor displacing spinal cord presents for evaluation due to difficulty passing urine. She reports starting last week has noted urethral discomfort and suprapubic pressure, urine won't flow easily, has to lean forward to pass urine, straining doesn't help, urine character is normal, denies dysuria "more of a tickle." She is wearing lumbar support band, reports significant height loss "5 inches" in recent years, has some intermittent midline discomfort if not wearing support. Denies back/flank pain, abdominal pain, fever/chills, nausea, vomiting.   She has stage IV breast cancer. 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 Dalbert Batman in 04/2018. She underwent radiation and chemotherapy, followed by Dr. Jana Hakim. Holding the fulvestrant and palbociclib because of the pandemic, continues on femara.  Her last fulvestrant dose was 07/22/2018. PET in 10/2018 showed no hypermetabolic areas.  1 vaginal delivery, had abdominal hysterectomy in 1980s, can't recall if ovaries were taken, unsure why this was done. Last pelvic remote. Not sexually active, denies hx of STIs.    Past Medical History:  Diagnosis Date  . Arthritis   . Breast cancer [facility]) 1985/2016   takes [name] daily  . Chronic back pain  stenosis/listhesis  . Family history of ovarian cancer   . Osteoporosis    takes Vit D  . Radiation 11/23/14-12/11/14   30 Gy T1-T5      Allergies  Allergen Reactions  . Ativan [Lorazepam]     Made her  crazy  . Dilaudid [Hydromorphone Hcl]     Doesn't want  . Haldol [Haloperidol Lactate]     Made her crazy    Current Outpatient Medications on File Prior to Visit  Medication Sig  . Ascorbic Acid (VITAMIN C PO) Take 1 tablet by mouth every other day. Takes qod  . aspirin EC 325 MG tablet Take 1 tablet (325 mg total) by mouth daily.  . Cholecalciferol (VITAMIN D3 ADULT GUMMIES PO) Take 2,000 Units by mouth daily.   . Cyanocobalamin (VITAMIN B-12 PO) Take by mouth daily. Takes every other day  . Iron-Vitamins (GERITOL PO) Take by mouth daily.  Marland Kitchen letrozole (FEMARA) 2.5 MG tablet Take 1 tablet (2.5 mg total) by mouth daily.   No current facility-administered medications on file prior to visit.    ROS: all negative except above.   Physical Exam:  BP 136/78   Pulse 88   Temp 97.6 F (36.4 C)   Resp 16   Ht 5' 1.5" (1.562 m)   Wt 124 lb 12.8 oz (56.6 kg)   SpO2 97%   BMI 23.20 kg/m   General Appearance: Well dressed well nourished elderly female, in no apparent distress. Eyes: PERRLA, EOMs, conjunctiva no swelling or erythema Sinuses: No Frontal/maxillary tenderness ENT/Mouth: Ext aud canals clear, TMs without erythema, bulging. No erythema, swelling, or exudate on post pharynx.  Tonsils not swollen or erythematous. Hearing normal.  Neck: Supple, thyroid normal.  Respiratory: Respiratory effort normal, BS equal bilaterally without rales, rhonchi, wheezing or stridor.  Cardio: RRR with no MRGs. Brisk peripheral pulses without edema.  Abdomen: Soft, + BS.  Suprapubic/LLQ mildly tender, no guarding, rebound, hernias, masses, bladder nonpalpable. No CVS tenderness.  Lymphatics: Non tender without lymphadenopathy.  Musculoskeletal: Mild kyphosis, 5/5 strength throughout bil lower extremities, slow steady gait, ROM limited in all spheres by lumbar back pain, no midline tenderness, SI joint tenderness, paraspinal spasm Skin: Warm, dry without rashes, lesions, ecchymosis.  Neuro: Normal  muscle tone, distal Sensation intact. bil patellar/achilles reflexes diminished, symmetrical.  Psych: Awake and oriented X 3, normal affect, Insight and Judgment appropriate.  GU: unable to tolerate full pelvic; external exam shows normal urethra, no significant erythema, no vaginal discharge, small introitus without obvious prolapse on limited exam    Izora Ribas, NP 11:37 AM [facility]

## 2019-10-04 ENCOUNTER — Other Ambulatory Visit: Payer: Medicare Other

## 2019-10-04 ENCOUNTER — Other Ambulatory Visit: Payer: Self-pay | Admitting: Adult Health

## 2019-10-04 ENCOUNTER — Encounter: Payer: Self-pay | Admitting: Adult Health

## 2019-10-04 DIAGNOSIS — S32010A Wedge compression fracture of first lumbar vertebra, initial encounter for closed fracture: Secondary | ICD-10-CM | POA: Insufficient documentation

## 2019-10-04 MED ORDER — TAMSULOSIN HCL 0.4 MG PO CAPS: 0.4000 mg | ORAL_CAPSULE | Freq: Every day | ORAL | 0 refills | Status: DC

## 2019-10-05 LAB — CBC WITH DIFFERENTIAL/PLATELET
Absolute Monocytes: 512 cells/uL (ref 200–950)
Basophils Absolute: 52 cells/uL (ref 0–200)
Basophils Relative: 1.1 %
Eosinophils Absolute: 169 cells/uL (ref 15–500)
Eosinophils Relative: 3.6 %
HCT: 46.7 % — ABNORMAL HIGH (ref 35.0–45.0)
Hemoglobin: 15.3 g/dL (ref 11.7–15.5)
Lymphs Abs: 912 cells/uL (ref 850–3900)
MCH: 30 pg (ref 27.0–33.0)
MCHC: 32.8 g/dL (ref 32.0–36.0)
MCV: 91.6 fL (ref 80.0–100.0)
MPV: 9.8 fL (ref 7.5–12.5)
Monocytes Relative: 10.9 %
Neutro Abs: 3055 cells/uL (ref 1500–7800)
Neutrophils Relative %: 65 %
Platelets: 301 10*3/uL (ref 140–400)
RBC: 5.1 10*6/uL (ref 3.80–5.10)
RDW: 12.9 % (ref 11.0–15.0)
Total Lymphocyte: 19.4 %
WBC: 4.7 10*3/uL (ref 3.8–10.8)

## 2019-10-05 LAB — URINE CULTURE
MICRO NUMBER:: 10459855
SPECIMEN QUALITY:: ADEQUATE

## 2019-10-05 LAB — URINALYSIS, ROUTINE W REFLEX MICROSCOPIC
Bacteria, UA: NONE SEEN /HPF
Bilirubin Urine: NEGATIVE
Glucose, UA: NEGATIVE
Hyaline Cast: NONE SEEN /LPF
Ketones, ur: NEGATIVE
Nitrite: NEGATIVE
Specific Gravity, Urine: 1.008 (ref 1.001–1.03)
Squamous Epithelial / HPF: NONE SEEN /HPF (ref <=5)
WBC, UA: >=60 /HPF — AB (ref 0–5)
pH: 7 (ref 5.0–8.0)

## 2019-10-05 LAB — BASIC METABOLIC PANEL WITH GFR
BUN: 13 mg/dL (ref 7–25)
CO2: 31 mmol/L (ref 20–32)
Calcium: 10.8 mg/dL — ABNORMAL HIGH (ref 8.6–10.4)
Chloride: 103 mmol/L (ref 98–110)
Creat: 0.65 mg/dL (ref 0.60–0.88)
GFR, Est African American: 97 mL/min/{1.73_m2} (ref >=60)
GFR, Est Non African American: 84 mL/min/{1.73_m2} (ref >=60)
Glucose, Bld: 87 mg/dL (ref 65–99)
Potassium: 4.5 mmol/L (ref 3.5–5.3)
Sodium: 140 mmol/L (ref 135–146)

## 2019-10-19 ENCOUNTER — Other Ambulatory Visit: Payer: Self-pay

## 2019-10-19 ENCOUNTER — Other Ambulatory Visit: Payer: Self-pay | Admitting: Internal Medicine

## 2019-10-19 ENCOUNTER — Ambulatory Visit (INDEPENDENT_AMBULATORY_CARE_PROVIDER_SITE_OTHER): Payer: Medicare Other | Admitting: *Deleted

## 2019-10-19 DIAGNOSIS — R300 Dysuria: Secondary | ICD-10-CM

## 2019-10-19 MED ORDER — AMOXICILLIN 250 MG PO CAPS: ORAL_CAPSULE | ORAL | 0 refills | Status: DC

## 2019-10-19 NOTE — Progress Notes (Signed)
Patient is here for a NV to check her urine. She states she has burning when she urinates and has difficulty urinating. She refuses to take Tamsulosin, due to reading it is for men and prostate problem. An RX for Amoxicillin was sent in by Dr Melford Aase for the patient to take until the culture results are completed.

## 2019-10-20 LAB — URINALYSIS, ROUTINE W REFLEX MICROSCOPIC
Bilirubin Urine: NEGATIVE
Glucose, UA: NEGATIVE
Ketones, ur: NEGATIVE
Nitrite: NEGATIVE
Specific Gravity, Urine: 1.013 (ref 1.001–1.03)
Squamous Epithelial / HPF: NONE SEEN /HPF (ref <=5)
pH: 6.5 (ref 5.0–8.0)

## 2019-10-20 LAB — URINE CULTURE
MICRO NUMBER:: 10523475
SPECIMEN QUALITY:: ADEQUATE

## 2019-10-28 ENCOUNTER — Other Ambulatory Visit: Payer: Self-pay

## 2019-10-28 ENCOUNTER — Ambulatory Visit (HOSPITAL_COMMUNITY)
Admission: RE | Admit: 2019-10-28 | Discharge: 2019-10-28 | Disposition: A | Discharge status: Home / Self Care | Payer: Medicare Other | Source: Ambulatory Visit | Attending: Oncology | Admitting: Oncology

## 2019-10-28 DIAGNOSIS — M818 Other osteoporosis without current pathological fracture: Secondary | ICD-10-CM | POA: Insufficient documentation

## 2019-10-28 DIAGNOSIS — R0989 Other specified symptoms and signs involving the circulatory and respiratory systems: Secondary | ICD-10-CM | POA: Diagnosis present

## 2019-10-28 DIAGNOSIS — Z170 Estrogen receptor positive status [ER+]: Secondary | ICD-10-CM | POA: Insufficient documentation

## 2019-10-28 DIAGNOSIS — C50412 Malignant neoplasm of upper-outer quadrant of left female breast: Secondary | ICD-10-CM | POA: Insufficient documentation

## 2019-10-28 DIAGNOSIS — C50919 Malignant neoplasm of unspecified site of unspecified female breast: Secondary | ICD-10-CM | POA: Diagnosis present

## 2019-10-28 DIAGNOSIS — G629 Polyneuropathy, unspecified: Secondary | ICD-10-CM | POA: Diagnosis present

## 2019-10-28 DIAGNOSIS — C7951 Secondary malignant neoplasm of bone: Secondary | ICD-10-CM | POA: Insufficient documentation

## 2019-10-28 DIAGNOSIS — C50912 Malignant neoplasm of unspecified site of left female breast: Secondary | ICD-10-CM | POA: Diagnosis not present

## 2019-10-28 MED ORDER — FLUOROESTRADIOL F 18 4-100 MCI/ML IV SOLN
6.0000 | Freq: Once | INTRAVENOUS | Status: AC
  Administered 2019-10-28: 6.1 via INTRAVENOUS

## 2019-10-31 ENCOUNTER — Other Ambulatory Visit: Payer: Self-pay | Admitting: Oncology

## 2019-10-31 DIAGNOSIS — C7951 Secondary malignant neoplasm of bone: Secondary | ICD-10-CM

## 2019-10-31 DIAGNOSIS — C50911 Malignant neoplasm of unspecified site of right female breast: Secondary | ICD-10-CM

## 2019-10-31 NOTE — Progress Notes (Signed)
I called April Rosales with the results of her F-18 estradiol scan, which are a bit confusing as far as the liver is concerned.  We discussed liver MRI but she really is very reluctant to go back in a "repeat O2".  I were going to try and see if we can see these possible lesions on ultrasound and if so we could consider biopsying them on another occasion.

## 2019-11-07 NOTE — Progress Notes (Deleted)
MEDICARE ANNUAL WELLNESS VISIT AND FOLLOW UP  Assessment:    Encounter for annual medicare wellness visit   Labile hypertension - continue medications, DASH diet, exercise and monitor at home. Call if greater than 130/80.   Other abnormal glucose Recent A1Cs at goal Discussed diet/exercise, weight management  Defer A1C; check CMP  Hyperlipidemia -continue medications, check lipids, decrease fatty foods, increase activity.   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 Continue follow up  Metastasis to bone [facility]) Continue follow up  Osteoporosis  Continue medications  Vitamin D deficiency At goal at recent check; continue to recommend supplementation for goal of 70-100 Defer vitamin D level  Medication management Gets CBC, CMP monthly via oncology, defer all labs today   Breast cancer metastasized to multiple sites, left [facility]) Continue follow up   Over 30 minutes of exam, counseling, chart review, and critical decision making was performed Future Appointments  Date Time Provider [place]  11/08/2019 10:00 AM WL-US 1 WL-US [place]  11/09/2019 10:30 AM Liane Comber, NP [facility] None  12/02/2019 11:30 AM [facility] LAB 2 [facility] None  12/02/2019 12:00 PM Magrinat, [name], MD [facility] None  03/05/2020 11:00 AM [name], MD [facility] None    Plan:   During the course of the visit the patient was educated and counseled about appropriate screening and preventive services including:    Pneumococcal vaccine   Influenza vaccine  Td vaccine  Prevnar 13  Screening electrocardiogram  Screening mammography  Bone densitometry screening  Colorectal cancer screening  Diabetes screening  Glaucoma screening  Nutrition counseling   Advanced directives: given info/requested copies   Subjective:   April Rosales is a 79 y.o. female who presents  for Medicare Annual Wellness Visit and 3 month follow up on hypertension, prediabetes, hyperlipidemia, vitamin D def.   She has stage IV breast cancer, had lumpectomy in 1985 but most recently found to have left breast mass with invasive adenocarcimona on 08/29/2014, right lung nodules with recent CT chest 02/27 showing resolution of nodules, and pathological compression fractures of T3 with a tumor displacing epidural space. Following with Dr. Griffith Citron, was on letrozole and on denosumab for osteoporosis, now on fulvestrant and denosumab (injected every other month). She recently in 04/2018 had R lumpectomy by Dr. Dalbert Batman but no lymph node biopsy in light of stage 4 metastatic disease.  BMI is There is no height or weight on file to calculate BMI., she has not been working on diet and exercise. Wt Readings from Last 3 Encounters:  10/19/19 125 lb 12.8 oz (57.1 kg)  10/03/19 124 lb 12.8 oz (56.6 kg)  02/08/19 121 lb 9.6 oz (55.2 kg)   Her blood pressure has been controlled at home, today their BP is   She does not workout. She denies chest pain, shortness of breath, dizziness.   She is not on cholesterol medication and denies myalgias. Her cholesterol is not at goal. The cholesterol last visit was:   Lab Results  Component Value Date   CHOL 184 11/25/2017   HDL 55 11/25/2017   LDLCALC 109 (H) 11/25/2017   TRIG 100 11/25/2017   CHOLHDL 3.3 11/25/2017   She has been working on diet and exercise for glucose management, and denies paresthesia of the feet, polydipsia, polyuria and visual disturbances. Last A1C in the office was:  Lab Results  Component Value Date   HGBA1C 5.3 02/08/2019   Patient is on Vitamin  D supplement. Lab Results  Component Value Date   VD25OH 64 02/08/2019      Medication Review Current Outpatient Medications on File Prior to Visit  Medication Sig Dispense Refill  . amoxicillin (AMOXIL) 250 MG capsule Take 1 capsule 3 x /day with meals for infection 30 capsule 0   . Ascorbic Acid (VITAMIN C PO) Take 1 tablet by mouth every other day. Takes qod    . aspirin EC 325 MG tablet Take 1 tablet (325 mg total) by mouth daily. 30 tablet 0  . Cholecalciferol (VITAMIN D3 ADULT GUMMIES PO) Take 2,000 Units by mouth daily.     . Cyanocobalamin (VITAMIN B-12 PO) Take by mouth daily. Takes every other day    . Iron-Vitamins (GERITOL PO) Take by mouth daily.    Marland Kitchen letrozole (FEMARA) 2.5 MG tablet Take 1 tablet (2.5 mg total) by mouth daily. 90 tablet 4  . tamsulosin (FLOMAX) 0.4 MG CAPS capsule TAKE 1 CAPSULE(0.4 MG) BY MOUTH DAILY AFTER SUPPER 30 capsule 0   No current facility-administered medications on file prior to visit.    Current Problems (verified) Patient Active Problem List   Diagnosis Date Noted  . Closed compression fracture of body of L1 vertebra ([place]) 10/04/2019  . Aortic atherosclerosis ([place]) 09/26/2019  . Neuropathy 03/27/2016  . Malignant neoplasm of upper-outer quadrant of left breast in female, estrogen receptor positive ([place]) 09/11/2015  . BMI 22.0-22.9, adult 03/14/2015  . Metastasis to bone ([place]) 10/20/2014  . Breast cancer metastasized to multiple sites ([place]) 08/28/2014  . 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 Medication management 07/07/2013  . Labile hypertension   . Hyperlipidemia   . Other abnormal glucose   . Vitamin D deficiency   . Osteoporosis   . IBS (irritable bowel syndrome)     Screening Tests Immunization History  Administered Date(s) Administered  . Influenza Split 02/07/2013, 02/27/2017  . Influenza, High Dose Seasonal PF 03/14/2015, 03/24/2016, 03/05/2018  . Td 01/05/2008    Preventative care: Last colonoscopy: Never, declines all screening - will do hemoccut Last mammogram: 11/2017 MRI breast 02/2018 Last pap smear/pelvic exam: s/p TAH DEXA: 08/2016 - osteoporosis - no longer on treatment  CT Chest 06/2015 CT AB 07/2014  Prior vaccinations: TD or Tdap:  2009 - declines Influenza: 2019  Pneumococcal: declines Prevnar13: declines Shingles/Zostavax: declines  Names of Other Physician/Practitioners you currently use: 1. [facility]- here for primary care 2. NONE, eye doctor, declines, readers and does well  3. unknown, dentist, last visit 02/2018, goes q44m  Patient Care Team: [name], MD as PCP - General (Internal Medicine) Magrinat, [name], MD as Consulting Physician (Oncology) [name], MD as Consulting Physician (Orthopedic Surgery) [name], MD as Consulting Physician (General Surgery)  Past Surgical History:  Procedure Laterality Date  . ABDOMINAL HYSTERECTOMY     1980  . APPENDECTOMY  1977  . APPLICATION OF INTRAOPERATIVE CT SCAN N/A 10/20/2014   Procedure: APPLICATION OF INTRAOPERATIVE CAT SCAN;  Surgeon: [name], MD;  Location: [place] [facility];  Service: Neurosurgery;  Laterality: N/A;  . BREAST LUMPECTOMY WITH RADIOACTIVE SEED LOCALIZATION Left 05/03/2018   Procedure: LEFT BREAST LUMPECTOMY WITH BRACKETED RADIOACTIVE SEED LOCALIZATION;  Surgeon: [name], MD;  Location: [place];  Service: General;  Laterality: Left;  . BREAST SURGERY Right 1985  . THYROID CYST EXCISION  1967   Family History  Problem Relation Age of Onset  .  Heart disease Mother   . Hypertension Mother   . Heart disease Father   . Diabetes Father   . Breast cancer Paternal Aunt        4 paternal aunts with breast cancer over 9  . Prostate cancer Paternal Uncle   . Stroke Paternal Grandfather   . Ovarian cancer Paternal Aunt   . Huntington's disease Other        Nephew, inherited from his father   Social History   Tobacco Use  . Smoking status: Former Smoker    Packs/day: 0.50    Years: 10.00    Pack years: 5.00    Types: Cigarettes    Quit date: 05/03/1970    Years since quitting: 49.5  . Smokeless tobacco: Former Systems developer  . Tobacco comment: quit smoking  1970's  Substance Use Topics  . Alcohol use: Yes    Alcohol/week: 0.0 standard drinks    Comment: Rare  . Drug use: No    MEDICARE WELLNESS OBJECTIVES: Tobacco use: She does not smoke.  Patient is a former smoker. If yes, counseling given Alcohol Current alcohol use: none Osteoporosis: postmenopausal estrogen deficiency, dietary calcium and/or vitamin D deficiency and amenorrhea, History of fracture in the past year: yes Diet: in general, a "healthy" diet   Physical activity:   Cardiac risk factors:   Depression/mood screen:   Depression screen [facility] 2/9 02/07/2019  Decreased Interest 0  Down, Depressed, Hopeless 0  PHQ - 2 Score 0  Some recent data might be hidden    ADLs:  In your present state of health, do you have any difficulty performing the following activities: 02/07/2019  Hearing? N  Vision? N  Difficulty concentrating or making decisions? N  Walking or climbing stairs? N  Dressing or bathing? N  Doing errands, shopping? N  Some recent data might be hidden     Cognitive Testing  Alert? Yes  Normal Appearance?Yes  Oriented to person? Yes  Place? Yes   Time? Yes  Recall of three objects?  Yes  Can perform simple calculations? Yes  Displays appropriate judgment?Yes  Can read the correct time from a watch face?Yes  EOL planning:     Objective:   There were no vitals filed for this visit. There is no height or weight on file to calculate BMI.  General appearance: alert, no distress, WD/WN,  female HEENT: normocephalic, sclerae anicteric, TMs pearly, nares patent, no discharge or erythema, pharynx normal, no thrush, + maxillary tenderness to palpation Oral cavity: MMM, no lesions Neck: supple, no lymphadenopathy, no thyromegaly, no masses Heart: RRR, normal S1, S2, no murmurs Lungs: CTA bilaterally, no wheezes, rhonchi, or rales Abdomen: +bs, soft, non tender, non distended, no masses, no hepatomegaly, no splenomegaly Musculoskeletal: cervical/throacic  kyphosis, nontender, no swelling, no obvious deformity Extremities: no edema, no cyanosis, no clubbing Pulses: 2+ symmetric, upper and lower extremities, normal cap refill Neurological: alert, oriented x 3, CN2-12 intact, strength normal upper extremities and lower extremities, sensation normal throughout, DTRs 2+ throughout, no cerebellar signs, gait normal Psychiatric: normal affect, behavior normal, pleasant  Breast: defer Gyn: defer Rectal: defer   Medicare Attestation I have personally reviewed: The patient's medical and social history Their use of alcohol, tobacco or illicit drugs Their current medications and supplements The patient's functional ability including ADLs,fall risks, home safety risks, cognitive, and hearing and visual impairment Diet and physical activities Evidence for depression or mood disorders  The patient's weight, height, BMI, and visual acuity have been recorded in  the chart.  I have made referrals, counseling, and provided education to the patient based on review of the above and I have provided the patient with a written personalized care plan for preventive services.     Izora Ribas, NP   11/07/2019

## 2019-11-08 ENCOUNTER — Ambulatory Visit (HOSPITAL_COMMUNITY)
Admission: RE | Admit: 2019-11-08 | Discharge: 2019-11-08 | Disposition: A | Discharge status: Home / Self Care | Payer: Medicare Other | Source: Ambulatory Visit | Attending: Oncology | Admitting: Oncology

## 2019-11-08 ENCOUNTER — Other Ambulatory Visit: Payer: Self-pay

## 2019-11-08 DIAGNOSIS — K8020 Calculus of gallbladder without cholecystitis without obstruction: Secondary | ICD-10-CM | POA: Diagnosis not present

## 2019-11-08 DIAGNOSIS — C50911 Malignant neoplasm of unspecified site of right female breast: Secondary | ICD-10-CM

## 2019-11-08 DIAGNOSIS — C7951 Secondary malignant neoplasm of bone: Secondary | ICD-10-CM | POA: Diagnosis not present

## 2019-11-09 ENCOUNTER — Encounter: Payer: Self-pay | Admitting: Adult Health Nurse Practitioner

## 2019-11-09 ENCOUNTER — Ambulatory Visit (INDEPENDENT_AMBULATORY_CARE_PROVIDER_SITE_OTHER): Payer: Medicare Other | Admitting: Adult Health Nurse Practitioner

## 2019-11-09 ENCOUNTER — Ambulatory Visit: Payer: Medicare Other | Admitting: Adult Health

## 2019-11-09 VITALS — BP 118/74 | HR 93 | Temp 97.5°F | Wt 123.0 lb

## 2019-11-09 DIAGNOSIS — M818 Other osteoporosis without current pathological fracture: Secondary | ICD-10-CM | POA: Diagnosis not present

## 2019-11-09 DIAGNOSIS — E559 Vitamin D deficiency, unspecified: Secondary | ICD-10-CM

## 2019-11-09 DIAGNOSIS — R7309 Other abnormal glucose: Secondary | ICD-10-CM

## 2019-11-09 DIAGNOSIS — I700 Atherosclerosis of aorta: Secondary | ICD-10-CM

## 2019-11-09 DIAGNOSIS — Z79899 Other long term (current) drug therapy: Secondary | ICD-10-CM

## 2019-11-09 DIAGNOSIS — K589 Irritable bowel syndrome without diarrhea: Secondary | ICD-10-CM

## 2019-11-09 DIAGNOSIS — C7951 Secondary malignant neoplasm of bone: Secondary | ICD-10-CM | POA: Diagnosis not present

## 2019-11-09 DIAGNOSIS — R0989 Other specified symptoms and signs involving the circulatory and respiratory systems: Secondary | ICD-10-CM | POA: Diagnosis not present

## 2019-11-09 DIAGNOSIS — C50919 Malignant neoplasm of unspecified site of unspecified female breast: Secondary | ICD-10-CM | POA: Diagnosis not present

## 2019-11-09 DIAGNOSIS — E782 Mixed hyperlipidemia: Secondary | ICD-10-CM

## 2019-11-09 DIAGNOSIS — Z6822 Body mass index (BMI) 22.0-22.9, adult: Secondary | ICD-10-CM

## 2019-11-09 DIAGNOSIS — S32010A Wedge compression fracture of first lumbar vertebra, initial encounter for closed fracture: Secondary | ICD-10-CM

## 2019-11-09 NOTE — Patient Instructions (Signed)
declined

## 2019-11-09 NOTE — Progress Notes (Signed)
MEDICARE ANNUAL WELLNESS VISIT AND FOLLOW UP  Assessment:    Encounter for annual medicare wellness visit  Yearly  Labile hypertension - continue medications, DASH diet, exercise and monitor at home. Call if greater than 130/80.   Other abnormal glucose Recent A1Cs at goal Discussed diet/exercise, weight management  Defer A1C; check CMP  Hyperlipidemia -continue medications, check lipids, decrease fatty foods, increase activity.   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 Continue follow up Wears back brace to help  Metastasis to bone [facility]) Continue follow up  Osteoporosis  Continue medications  Vitamin D deficiency At goal at recent check; continue to recommend supplementation for goal of 70-100 Defer vitamin D level  Medication management Gets CBC, CMP monthly via oncology, defer all labs today   Breast cancer metastasized to multiple sites, left [facility]) Continue follow up   Over 30 minutes of exam, counseling, chart review, and critical decision making was performed Future Appointments  Date Time Provider [place]  12/02/2019 11:30 AM [facility] LAB 2 [facility] None  12/02/2019 12:00 PM Magrinat, [name], MD [facility] None  03/05/2020 11:00 AM [name], MD [facility] None    Plan:   During the course of the visit the patient was educated and counseled about appropriate screening and preventive services including:    Pneumococcal vaccine   Influenza vaccine  Td vaccine  Prevnar 13  Screening electrocardiogram  Screening mammography  Bone densitometry screening  Colorectal cancer screening  Diabetes screening  Glaucoma screening  Nutrition counseling   Advanced directives: given info/requested copies   Subjective:   April Rosales is a 79 y.o. female who presents for Medicare Annual Wellness Visit and 3 month follow up on  hypertension, prediabetes, hyperlipidemia, vitamin D def.   She has stage IV breast cancer, had lumpectomy in 1985 but most recently found to have left breast mass with invasive adenocarcimona on 08/29/2014, right lung nodules with recent CT chest 02/27 showing resolution of nodules, and pathological compression fractures of T3 with a tumor displacing epidural space. Following with Dr. Griffith Citron, was on letrozole and on denosumab for osteoporosis, now on fulvestrant and denosumab (injected every other month). She recently in 04/2018 had R lumpectomy by Dr. Dalbert Batman but no lymph node biopsy in light of stage 4 metastatic disease.  Next appointment is next month.  She had a UA on 5/26 and completed amoxacillin.  She reports she is asymptomatics.   Drink 2-3 glasses of water a day, she also drinks milk and tea as well.    BMI is Body mass index is 22.86 kg/m., she has not been working on diet and exercise. Wt Readings from Last 3 Encounters:  11/09/19 123 lb (55.8 kg)  10/19/19 125 lb 12.8 oz (57.1 kg)  10/03/19 124 lb 12.8 oz (56.6 kg)   Her blood pressure has been controlled at home, today their BP is BP: 118/74 She does not workout. She denies chest pain, shortness of breath, dizziness.   She is not on cholesterol medication and denies myalgias. Her cholesterol is not at goal. The cholesterol last visit was:   Lab Results  Component Value Date   CHOL 184 11/25/2017   HDL 55 11/25/2017   LDLCALC 109 (H) 11/25/2017   TRIG 100 11/25/2017   CHOLHDL 3.3 11/25/2017   She has been working on diet and exercise for glucose management, and denies paresthesia of the feet, polydipsia, polyuria and visual disturbances. Last A1C  in the office was:  Lab Results  Component Value Date   HGBA1C 5.3 02/08/2019   Patient is on Vitamin D supplement. Lab Results  Component Value Date   VD25OH 64 02/08/2019      Medication Review Current Outpatient Medications on File Prior to Visit  Medication Sig  Dispense Refill   Ascorbic Acid (VITAMIN C PO) Take 1 tablet by mouth every other day. Takes qod     aspirin EC 325 MG tablet Take 1 tablet (325 mg total) by mouth daily. 30 tablet 0   Cholecalciferol (VITAMIN D3 ADULT GUMMIES PO) Take 2,000 Units by mouth daily.      Cyanocobalamin (VITAMIN B-12 PO) Take by mouth daily. Takes every other day     Iron-Vitamins (GERITOL PO) Take by mouth daily.     letrozole (FEMARA) 2.5 MG tablet Take 1 tablet (2.5 mg total) by mouth daily. 90 tablet 4   amoxicillin (AMOXIL) 250 MG capsule Take 1 capsule 3 x /day with meals for infection 30 capsule 0   tamsulosin (FLOMAX) 0.4 MG CAPS capsule TAKE 1 CAPSULE(0.4 MG) BY MOUTH DAILY AFTER SUPPER 30 capsule 0   No current facility-administered medications on file prior to visit.    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 08/22/2014   Medication management 07/07/2013   Labile hypertension    Hyperlipidemia    Other abnormal glucose    Vitamin D deficiency    Osteoporosis    IBS (irritable bowel syndrome)     Screening Tests Immunization History  Administered Date(s) Administered   Influenza Split 02/07/2013, 02/27/2017   Influenza, High Dose Seasonal PF 03/14/2015, 03/24/2016, 03/05/2018   Td 01/05/2008    Preventative care: Last colonoscopy: Never, declines all screening - will do hemoccut Last mammogram: 11/2017 MRI breast 02/2018 Last pap smear/pelvic exam: s/p TAH DEXA:  08/2016 - osteoporosis - no longer on treatment, DUE, discussed with patient, declined at this time. CT Chest 06/2015 CT AB 07/2014  Prior vaccinations: TD or Tdap: 2009 - declines Influenza: 2019  Pneumococcal: declines Prevnar13: declines Shingles/Zostavax: declines  Names of Other Physician/Practitioners you currently use: 1. [facility]- here for primary care 2. NONE, eye doctor, declines, readers and does well  3. unknown, dentist, last visit 02/2019, goes q3m due for 2021  Patient Care Team: [name], MD as PCP - General (Internal Medicine) Magrinat, [name], MD as Consulting Physician (Oncology) [name], MD as Consulting Physician (Orthopedic Surgery) [name], MD as Consulting Physician (General Surgery)  Past Surgical History:  Procedure Laterality Date   ABDOMINAL HYSTERECTOMY     1980   APPENDECTOMY  2774   APPLICATION OF INTRAOPERATIVE CT SCAN N/A 10/20/2014   Procedure: APPLICATION OF INTRAOPERATIVE CAT SCAN;  Surgeon: [name], MD;  Location: [place] [facility];  Service: Neurosurgery;  Laterality: N/A;   BREAST LUMPECTOMY WITH RADIOACTIVE SEED LOCALIZATION Left 05/03/2018   Procedure: LEFT BREAST LUMPECTOMY WITH BRACKETED RADIOACTIVE SEED LOCALIZATION;  Surgeon: [name], MD;  Location: [facility];  Service: General;  Laterality: Left;   BREAST SURGERY Right 1985   THYROID CYST EXCISION  1967   Family History  Problem Relation Age of Onset   Heart disease Mother    Hypertension Mother    Heart disease Father    Diabetes Father    Breast cancer Paternal Aunt        4 paternal aunts with breast cancer over 74   Prostate cancer Paternal Uncle    Stroke Paternal Grandfather    Ovarian cancer Paternal Aunt    Huntington's disease Other        Nephew, inherited from his father   Social History   Tobacco Use   Smoking status: Former Smoker    Packs/day: 0.50     Years: 10.00    Pack years: 5.00    Types: Cigarettes    Quit date: 05/03/1970    Years since quitting: 49.5   Smokeless tobacco: Former Systems developer   Tobacco comment: quit smoking 1970's  Substance Use Topics   Alcohol use: Yes    Alcohol/week: 0.0 standard drinks    Comment: Rare   Drug use: No    MEDICARE WELLNESS OBJECTIVES: Tobacco use: She does not smoke.  Patient is a former smoker. If yes, counseling given Alcohol Current alcohol use: none Osteoporosis: postmenopausal estrogen deficiency, dietary calcium and/or vitamin D deficiency and amenorrhea, History of fracture in the past year: yes Diet: in general, a "healthy" diet   Physical activity:   Cardiac risk factors: Cardiac Risk Factors include: advanced age (>6men, >53 women);sedentary lifestyle Depression/mood screen:   Depression screen [facility] 2/9 11/09/2019  Decreased Interest 0  Down, Depressed, Hopeless 0  PHQ - 2 Score 0  Some recent data might be hidden    ADLs:  In your present state of health, do you have any difficulty performing the following activities: 11/09/2019 02/07/2019  Hearing? N N  Vision? N N  Difficulty concentrating or making decisions? N N  Walking or climbing stairs? N N  Dressing or bathing? N N  Doing errands, shopping? N N  Preparing Food and eating ? N -  Using the Toilet? N -  In the past six months, have you accidently leaked urine? N -  Do you have problems with loss of bowel control? N -  Managing your Medications? N -  Managing your Finances? N -  Housekeeping or managing your Housekeeping? N -  Some recent data might be hidden     Cognitive Testing  Alert? Yes  Normal Appearance?Yes  Oriented to person? Yes  Place? Yes   Time? Yes  Recall of three objects?  Yes  Can perform simple calculations? Yes  Displays appropriate judgment?Yes  Can read the correct time from a watch face?Yes  EOL planning: Does Patient Have a Medical Advance Directive?: No Would patient like  information on creating a medical advance directive?: No - Patient declined   Objective:   Today's Vitals   11/09/19 1031  BP: 118/74  Pulse: 93  Temp: (!) 97.5 F (36.4 C)  SpO2: 97%  Weight: 123 lb (55.8 kg)   Body mass index is 22.86 kg/m.  General appearance: alert, no distress, WD/WN,  female HEENT: normocephalic, sclerae anicteric, TMs pearly, nares patent, no discharge or erythema, pharynx normal, no thrush, + maxillary tenderness to palpation Oral cavity: MMM, no lesions Neck: supple, no lymphadenopathy, no thyromegaly, no masses Heart: RRR, normal S1, S2, no murmurs Lungs: CTA bilaterally, no wheezes, rhonchi, or rales  Abdomen: +bs, soft, non tender, non distended, no masses, no hepatomegaly, no splenomegaly, wearing lumbar support. Musculoskeletal: cervical/throacic kyphosis, nontender, no swelling, no obvious deformity Extremities: no edema, no cyanosis, no clubbing Pulses: 2+ symmetric, upper and lower extremities, normal cap refill Neurological: alert, oriented x 3, CN2-12 intact, strength normal upper extremities and lower extremities, sensation normal throughout, DTRs 2+ throughout, no cerebellar signs, gait normal Psychiatric: normal affect, behavior normal, pleasant  Breast: defer Gyn: defer Rectal: defer   Medicare Attestation I have personally reviewed: The patient's medical and social history Their use of alcohol, tobacco or illicit drugs Their current medications and supplements The patient's functional ability including ADLs,fall risks, home safety risks, cognitive, and hearing and visual impairment Diet and physical activities Evidence for depression or mood disorders  The patient's weight, height, BMI, and visual acuity have been recorded in the chart.  I have made referrals, counseling, and provided education to the patient based on review of the above and I have provided the patient with a written personalized care plan for preventive services.       Garnet Sierras, Laqueta Jean, DNP [facility] 11/09/2019  11:12 AM

## 2019-11-16 ENCOUNTER — Other Ambulatory Visit: Payer: Medicare Other

## 2019-11-16 ENCOUNTER — Ambulatory Visit: Payer: Medicare Other | Admitting: Oncology

## 2019-12-01 NOTE — Progress Notes (Signed)
[place]  [phone] [phone]    ID: April Rosales DOB: Aug 24, 1940  MR#: [id number]  HKV#:[id number]  Patient Care Team: April Pinto, MD as PCP - General (Internal Medicine) April Rosales, April Dad, MD as Consulting Physician (Oncology) April Killings, MD as Consulting Physician (Orthopedic Surgery) April Skates, MD as Consulting Physician (General Surgery) OTHER: April Rosales, DDS   CHIEF COMPLAINT: Stage IV estrogen receptor positive breast cancer  CURRENT TREATMENT: Fulvestrant, palbociclib   INTERVAL HISTORY: April Rosales was contacted today for follow-up of her stage IV estrogen receptor positive breast cancer.    We have been holding the fulvestrant and palbociclib because of the pandemic.  Her last fulvestrant dose was 07/22/2018.   [name], with no hot flashes no vaginal dryness and no arthralgias or myalgias.  Since her last visit, she underwent [place] PET scan on 10/28/2019 showing: new low-density lesions within dome of liver concerning for breast cancer metastasis, but difficult to assess on background of high physiologic liver activity; no additional evidence of estrogen receptor positive breast cancer.  This prompted abdominal ultrasound performed on 11/08/2019 showing: liver masses in right lobe of liver appear solid and are commensurate with lesions on recent PET.   REVIEW OF SYSTEMS:  April Rosales has been feeling normal.  She finally did get the first [place] vaccine and will receive the second dose on 12/09/2019.  She has been pretty much staying at home, not even going to church, though she does go to the grocery store and to get gas.  She has been a little bit depressed she thinks for that reason.  She has a good appetite, weight is stable, has had no taste perversion no nausea or vomiting and no change in bowel or bladder habits.  She denies pain  fever rash or bleeding.  Detailed review of systems today was otherwise stable.   BREAST CANCER HISTORY: From the original intake note:   April Rosales underwent right lumpectomy in 1985 at [facility] for what sounds like a stage I breast cancer. She tells me she had more than 30 lymph nodes removed from her right axilla and all of them were clear. She received  adjuvant radiation but no systemic treatment.  The patient had recently refused mammography with the last mammogram I can find dating back to August 2010.  More recently the patient presented with left scapular pain radiating down the left arm.  She was evaluated by Dr. Lorin Rosales, who obtained a chest x-ray showing a possible abnormality at T3. He then set up the patient for an MRI of the thoracic spine performed 08/16/2014.  This showed multiple compression fractures  (a similar picture had been noted on lumbar MRI 10/09/2011 ). However at T3 they noted tumor in the vertebral body extending into the left pedicle and into the lateral epidural space, displacing the cord to the right. There was no evidence of cord compression or cord signal abnormality. There were no other areas of tumor identified in the thoracic spine.         The patient was then referred to Dr. Hal Rosales who on 08/24/2014 set April Rosales up for CT scans of the chest, abdomen and pelvis. There was a dense mass in the upper inner quadrant of the left breast measuring 1.6 cm. There was a 1.2 cm nodule in the minor fissure of the right lung and some evidence of right lung fibrosis  at the site of the prior radiation port.  There was also a thyroid mass measuring 2 cm. However there were no parenchymal lung or liver lesions. Incidental meningoceles were noted as well as sclerosis of the fifth and sixth ribs which were felt to be likely posttraumatic.   On 08/29/2014 the patient underwent bilateral diagnostic mammography with tomosynthesis and left breast ultrasonography.  There were  postsurgical changes in the upper right breast. In the left breast there was an irregular mass measuring 2.3 cm in the upper inner quadrant. This was palpable. Ultrasound showed this to be hypoechoic and to measure 2.0 cm. There were adjacent areas of nodularity. There was no definite lymphadenopathy in the left axilla.  Biopsy of this breast mass 08/29/2014 showed an invasive adenocarcinoma with both ductal and lobular features (there was strong diffuse E-cadherin expression as well as areas with total absence of E-cadherin expression), with the preliminary prognostic profile showing strong estrogen positivity, very weak to near absent progesterone positivity, an MIB-1 of approximately 40%, and HER-2 equivocal  The patient's subsequent history is as detailed below.   PAST MEDICAL HISTORY: Past Medical History:  Diagnosis Date  . Arthritis   . Breast cancer [facility]) 1985/2016   takes [name] daily  . Chronic back pain    stenosis/listhesis  . Family history of ovarian cancer   . Osteoporosis    takes Vit D  . Radiation 11/23/14-12/11/14   30 Gy T1-T5     PAST SURGICAL HISTORY: Past Surgical History:  Procedure Laterality Date  . ABDOMINAL HYSTERECTOMY     1980  . APPENDECTOMY  1977  . APPLICATION OF INTRAOPERATIVE CT SCAN N/A 10/20/2014   Procedure: APPLICATION OF INTRAOPERATIVE CAT SCAN;  Surgeon: April Chimera, MD;  Location: [place] [facility];  Service: Neurosurgery;  Laterality: N/A;  . BREAST LUMPECTOMY WITH RADIOACTIVE SEED LOCALIZATION Left 05/03/2018   Procedure: LEFT BREAST LUMPECTOMY WITH BRACKETED RADIOACTIVE SEED LOCALIZATION;  Surgeon: April Skates, MD;  Location: [place];  Service: General;  Laterality: Left;  . BREAST SURGERY Right 1985  . THYROID CYST EXCISION  1967    FAMILY HISTORY Family History  Problem Relation Age of Onset  . Heart disease Mother   . Hypertension Mother   . Heart disease Father   . Diabetes Father   . Breast cancer Paternal  Aunt        4 paternal aunts with breast cancer over 47  . Prostate cancer Paternal Uncle   . Stroke Paternal Grandfather   . Ovarian cancer Paternal Aunt   . Huntington's disease Other        Nephew, inherited from his father   The patient's father died from a heart attack at the age of 55. He had 9 sisters. 3 of those sisters had breast cancer, all in a menopausal setting. Another sister had ovarian cancer. One of the paternal uncles had cancer of the colon "and back".  The patient's mother died at the age of 34. She was found to have breast cancer shortly before dying , during her final hospitalization.   GYNECOLOGIC HISTORY:  No LMP recorded. Patient has had a hysterectomy.  Menarche age 61, first live birth age 57, the patient is GX P1. She underwent hysterectomy in 1980. She thinks the ovaries were removed, but the CT scan obtained 08/24/2014 showed a definite right ovary. The left ovary may have been removed. She did not take hormone replacement after the hysterectomy.   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 She is divorced, lives alone, with 2 cats. Her son April Rosales lives in [place] where he works in Engineer, technical sales. He has 4 children of his own. The patient is a Psychologist, forensic.   ADVANCED DIRECTIVES:  In place. The patient has named her sister April Rosales [phone]) and her friend April Rosales [phone]) as joint healthcare powers of attorney   HEALTH MAINTENANCE: Social History   Tobacco Use  . Smoking status: Former Smoker    Packs/day: 0.50    Years: 10.00    Pack years: 5.00    Types: Cigarettes    Quit date: 05/03/1970    Years since quitting: 49.6  . Smokeless tobacco: Former Systems developer  . Tobacco comment: quit smoking 1970's  Substance Use Topics  . Alcohol use: Yes    Alcohol/week: 0.0 standard drinks    Comment: Rare  . Drug use: No     Colonoscopy: never  PAP: status post hysterectomy  Bone density: 09/12/2016 April Rosales/ T score of -4.8  osteoporosis  Lipid panel:  Allergies  Allergen Reactions  . Ativan [Lorazepam]     Made her crazy  . Dilaudid [Hydromorphone Hcl]     Doesn't want  . Haldol [Haloperidol Lactate]     Made her crazy    Current Outpatient Medications  Medication Sig Dispense Refill  . Ascorbic Acid (VITAMIN C PO) Take 1 tablet by mouth every other day. Takes qod    . aspirin EC 325 MG tablet Take 1 tablet (325 mg total) by mouth daily. 30 tablet 0  . Cholecalciferol (VITAMIN D3 ADULT GUMMIES PO) Take 2,000 Units by mouth daily.     . Cyanocobalamin (VITAMIN B-12 PO) Take by mouth daily. Takes every other day    . Iron-Vitamins (GERITOL PO) Take by mouth daily.    Marland Kitchen letrozole (FEMARA) 2.5 MG tablet Take 1 tablet (2.5 mg total) by mouth daily. 90 tablet 4   No current facility-administered medications for this visit.    OBJECTIVE: White woman in no acute distress  Vitals:   12/02/19 1151  BP: (!) 152/82  Pulse: (!) 105  Resp: 18  Temp: 98.9 F (37.2 C)  SpO2: 97%   Wt Readings from Last 3 Encounters:  12/02/19 122 lb (55.3 kg)  11/09/19 123 lb (55.8 kg)  10/19/19 125 lb 12.8 oz (57.1 kg)   Body mass index is 22.68 kg/m.    ECOG FS:1 - Symptomatic but completely ambulatory  Sclerae unicteric, EOMs intact Wearing a mask No cervical or supraclavicular adenopathy Lungs no rales or rhonchi Heart regular rate and rhythm Abd soft, nontender, positive bowel sounds, no masses palpated MSK kyphosis but no focal spinal tenderness, no upper extremity lymphedema Neuro: nonfocal, well oriented, appropriate affect Breasts: The right breast is status post lumpectomy.  There is no evidence of local recurrence.  The left breast is benign.  Both axillae are benign.  LAB RESULTS:  CMP     Component Value Date/Time   NA 140 10/03/2019 1205   NA 141 05/29/2017 1417   K 4.5 10/03/2019 1205   K 3.6 05/29/2017 1417   CL 103 10/03/2019 1205   CO2 31 10/03/2019 1205   CO2 27 05/29/2017 1417    GLUCOSE 87 10/03/2019 1205   GLUCOSE 111 05/29/2017 1417   BUN 13 10/03/2019 1205   BUN 17.0 05/29/2017 1417   CREATININE 0.65 10/03/2019 1205   CREATININE 0.8 05/29/2017 1417   CALCIUM 10.8 (H) 10/03/2019 1205   CALCIUM 10.7 (H) 06/26/2017 1216  CALCIUM 10.4 05/29/2017 1417   PROT 7.0 02/08/2019 1145   PROT 7.3 05/29/2017 1417   ALBUMIN 3.8 12/29/2018 1411   ALBUMIN 3.9 05/29/2017 1417   AST 19 02/08/2019 1145   AST 17 05/29/2017 1417   ALT 13 02/08/2019 1145   ALT 15 05/29/2017 1417   ALKPHOS 38 12/29/2018 1411   ALKPHOS 42 05/29/2017 1417   BILITOT 0.4 02/08/2019 1145   BILITOT 0.30 05/29/2017 1417   GFRNONAA 84 10/03/2019 1205   GFRAA 97 10/03/2019 1205    INo results found for: SPEP, UPEP  Lab Results  Component Value Date   WBC 5.5 12/02/2019   NEUTROABS 3.5 12/02/2019   HGB 14.4 12/02/2019   HCT 43.7 12/02/2019   MCV 90.7 12/02/2019   PLT 264 12/02/2019      Chemistry      Component Value Date/Time   NA 140 10/03/2019 1205   NA 141 05/29/2017 1417   K 4.5 10/03/2019 1205   K 3.6 05/29/2017 1417   CL 103 10/03/2019 1205   CO2 31 10/03/2019 1205   CO2 27 05/29/2017 1417   BUN 13 10/03/2019 1205   BUN 17.0 05/29/2017 1417   CREATININE 0.65 10/03/2019 1205   CREATININE 0.8 05/29/2017 1417      Component Value Date/Time   CALCIUM 10.8 (H) 10/03/2019 1205   CALCIUM 10.7 (H) 06/26/2017 1216   CALCIUM 10.4 05/29/2017 1417   ALKPHOS 38 12/29/2018 1411   ALKPHOS 42 05/29/2017 1417   AST 19 02/08/2019 1145   AST 17 05/29/2017 1417   ALT 13 02/08/2019 1145   ALT 15 05/29/2017 1417   BILITOT 0.4 02/08/2019 1145   BILITOT 0.30 05/29/2017 1417       Lab Results  Component Value Date   LABCA2 24 01/01/2016    No components found for: [id number]  No results for input(s): INR in the last 168 hours.  Urinalysis    Component Value Date/Time   COLORURINE YELLOW 10/19/2019 1120   APPEARANCEUR TURBID (A) 10/19/2019 1120   LABSPEC 1.013 10/19/2019 1120     PHURINE 6.5 10/19/2019 1120   GLUCOSEU NEGATIVE 10/19/2019 1120   HGBUR 1+ (A) 10/19/2019 1120   BILIRUBINUR NEGATIVE 09/19/2015 1239   KETONESUR NEGATIVE 10/19/2019 1120   PROTEINUR 1+ (A) 10/19/2019 1120   NITRITE NEGATIVE 10/19/2019 1120   LEUKOCYTESUR 3+ (A) 10/19/2019 1120    STUDIES: US Abdomen Limited  Result Date: 11/08/2019 CLINICAL DATA:  Liver lesions seen on recent PET-CT examination EXAM: ULTRASOUND ABDOMEN LIMITED RIGHT UPPER QUADRANT COMPARISON:  PET-CT examination October 28, 2019 FINDINGS: Gallbladder: Within the gallbladder, there are echogenic foci which move and shadow consistent with cholelithiasis. Largest gallstone measures 1.5 cm in length. No gallbladder wall thickening or pericholecystic fluid. No sonographic Murphy sign noted by sonographer. Common bile duct: Diameter: 4 mm. No intrahepatic or extrahepatic biliary duct dilatation. Liver: There is a mass near the dome of the liver on the right which is solid and inhomogeneous but primarily hypoechoic measuring 4.4 x 2.8 x 3.4 cm. A second mass in the right lobe of the liver is solid and primarily hypoechoic measuring 2.1 x 1.6 x 1.9 cm. No other focal liver lesions are evident by ultrasound. Within normal limits in parenchymal echogenicity. Portal vein is patent on color Doppler imaging with normal direction of blood flow towards the liver. Other: None. IMPRESSION: 1. Liver masses in the right lobe of the liver appear solid and are commensurate with the lesions seen on recent PET study.  Metastatic foci must be of concern in this circumstance. 2. Cholelithiasis. No gallbladder wall thickening or pericholecystic fluid. Electronically Signed   By: Lowella Grip III M.D.   On: 11/08/2019 15:07     ASSESSMENT: 79 y.o. [place] woman with stage IV left breast cancer involving bone  (1) status post right lumpectomy and axillary node dissection in 1985 followed by radiation at [place] 08/16/2014: measurable  disease in spine, lung and left breast   (2) evaluation for left shoulder pain led to thoracic spine MRI 08/16/2014 showing a pathologic fracture at T3 with epidural tumor displacing the cord to the right, but no cord compression. CT scans of the chest, abdomen and pelvis 08/24/2014 showed in addition a mass in the upper outer quadrant left breast measuring 1.6 cm and a nodule in the minor fissure of the right lung measuring 1.2 cm, but no parenchymal lung or liver lesions.   (a) CA 27-29 was noninformative at 38 (09/20/2014)    (3) mammography and ultrasonography 08/29/2014 show a mass in the upper inner left breast which was palpable,  measuring 2.0 cm by ultrasound. Biopsy of this mass 08/29/2014 showed an invasive breast cancer with both lobular and ductal features, estrogen receptor positive, progesterone receptor weakly positive, with an MIB-1 in the 40% range, HER-2 equivocal (6 else ratio 1.5, but average number her nucleus 5.8)   (4) letrozole started 08/31/2014;   (a) palbociclib added Sept 2016 at 75 mg 21/7, with significant neutropenia; not repeated after first cycle  (b) letrozole discontinued 05/29/2017 with evidence of disease progression in the breast   (5)  zolendronate started 09/20/2014, stopped after initial dose due to poor tolerance  (a) denosumab/ Xgeva started 01/31/2015, repeated every 4 weeks  (b) changed to every 8 weeks as of August 2018.    (6) on 10/20/2014 the patient underwent T2-T3 and T4 decompressive laminectomy with removal of epidural tumor, C7-T4 segmental pedicle screw instrumentation with virage screw system with arrow guidance protocol and C7-T4 posterolateral fusion. The cells were positive for the estrogen receptor. HER-2/neu testing by [facility] showed again equivocal results,  (a) most recent bone scan 10/01/2016 finds no new lesions only postoperative changes  (7) radiation 11/23/2014-12/11/2014.  (a) T1-T5 was treated to 30 Gy in 12 fractions at 2.5 Gy per  fraction   (8) initiated close follow-up while considering eventual left lumpectomy or mastectomy depending on  longer-term results of systemic therapy  (a) most recent left breast ultrasonography 10/30/2016 found this mass to measure 0.7 cm  (b) repeat left breast ultrasonography 05/13/2017 showed the upper outer quadrant mass to have grown to 1.4 cm  (c) left breast ultrasound 09/08/2017 shows the mass to now measure 0.9 cm  (d) left breast biopsy x2 on 12/11/2017 shows high-grade ductal carcinoma in situ, essentially estrogen receptor negative  (9) genetics testing using the Breast/Ovarian Cancer Panel through [facility] [name], MD) found no deleterious mutations in ATM, BARD1, BRCA1, BRCA2, BRIP1, CDH1, CHEK2, EPCAM, FANCC, MLH1, MSH2, MSH6, NBN, PALB2, PMS2, PTEN, RAD51C, RAD51D, STK11, TP53, or XRCC2    (10) right thyroid nodule is a complex cyst as noted on CT scan of the neck 01/29/2016  (11) osteoporosis: [facility] at [facility] 09/12/2016 showed a T score of -4.8.  (a) continue denosumab/Xgeva as above  (12) fulvestrant started 05/29/2017, last dose 07/22/2018 (discontinued due to pandemic).  (a) started palbociclib 06/29/2017 at 75 mg every other day for 21 days on, 7 days off  (b) palbociclib discontinued on 3/29  due to progressive fatigue and patient preference  (13)  was to start abemaciclib 02/04/2018, but patient opted for going back to palbociclib at a lower dose beginning in November 19, patient subsequently declined s palbociclib, and  proceeded to surgery  (14) status post left lumpectomy 05/03/2018 showing a pT1b pNX invasive ductal carcinoma, grade 2, with equivocal HER-2 results  (a) opted against adjuvant radiation given presence of stage IV disease  (15) tamoxifen supposed to have been started started 09/13/2018 as a "bridge" pending resumption of fulvestrant/denosumab, but never started by the patient  (16) PET scan 11/02/2018 showed no active disease  (a)  cerianna scan on 10/31/2019 shows activity in 2 liver lesions, no active bone or lung lesions  (b) abdominal ultrasound 11/08/2019 confirms 2 liver lesions measuring 4.4 and 2.1 cm.  (c) biopsy of the liver lesions  (17) letrozole started 02/15/2019, discontinued 12/02/2019 with evidence of progression  (a) bone density 09/12/2016 showed a T score of -4.8.  (18) fulvestrant to be resumed 12/08/2019  (a) palbociclib to be added at 75 mg daily 21/7 starting 01/04/2020    PLAN: April Rosales is now a little over 5 years out from definitive diagnosis of metastatic breast cancer.  Her disease had been quite well controlled on fulvestrant and palbociclib, which were only discontinued to keep the patient from having to come to the office during the pandemic.  She then received letrozole and that appeared to be effective for some time but now we have evidence of disease progression namely estrogen receptor positive lesions in the liver.  April Rosales is asymptomatic from these lesions, which is favorable.  Nevertheless we do need to change therapy.  We are stopping the letrozole.  We are going to resume the fulvestrant next week.  She will receive a dose 2 weeks later and then a third dose 01/04/2020.  At that point she will see me and we will be starting palbociclib at 75 mg daily 21 days on and 7 days off.  We will repeat a liver ultrasound after 3 or 4 months on this treatment.  I am setting her up for a liver biopsy so we can get a repeat prognostic panel (a prior biopsy had been HER-2 equivocal) and also additional testing for possible targets through [facility].  April Rosales has a good understanding of this plan.  She agrees with it.  She knows to call for any other issues that may develop before the next visit.  Total encounter time 40 minutes.*   April Rosales, April Dad, MD  12/02/19 12:10 PM Medical Oncology and Hematology [facility] [place], [place] [address] Tel. [phone]     Fax. [phone]   I, Wilburn Mylar, am acting as scribe for Dr. Virgie Rosales. April Rosales.  I, Lurline Del MD, have reviewed the above documentation for accuracy and completeness, and I agree with the above.   *Total Encounter Time as defined by the Centers for Medicare and Medicaid Services includes, in addition to the face-to-face time of a patient visit (documented in the note above) non-face-to-face time: obtaining and reviewing outside history, ordering and reviewing medications, tests or procedures, care coordination (communications with other health care professionals or caregivers) and documentation in the medical record.

## 2019-12-02 ENCOUNTER — Inpatient Hospital Stay (HOSPITAL_BASED_OUTPATIENT_CLINIC_OR_DEPARTMENT_OTHER): Payer: Medicare Other | Admitting: Oncology

## 2019-12-02 ENCOUNTER — Other Ambulatory Visit: Payer: Self-pay | Admitting: Oncology

## 2019-12-02 ENCOUNTER — Telehealth: Payer: Self-pay | Admitting: Pharmacist

## 2019-12-02 ENCOUNTER — Telehealth: Payer: Self-pay

## 2019-12-02 ENCOUNTER — Inpatient Hospital Stay: Payer: Medicare Other | Attending: Oncology

## 2019-12-02 ENCOUNTER — Other Ambulatory Visit: Payer: Self-pay

## 2019-12-02 ENCOUNTER — Telehealth: Payer: Self-pay | Admitting: Oncology

## 2019-12-02 ENCOUNTER — Encounter (HOSPITAL_COMMUNITY): Payer: Self-pay | Admitting: Radiology

## 2019-12-02 VITALS — BP 152/82 | HR 105 | Temp 98.9°F | Resp 18 | Ht 61.5 in | Wt 122.0 lb

## 2019-12-02 DIAGNOSIS — Z170 Estrogen receptor positive status [ER+]: Secondary | ICD-10-CM | POA: Insufficient documentation

## 2019-12-02 DIAGNOSIS — Z5111 Encounter for antineoplastic chemotherapy: Secondary | ICD-10-CM | POA: Insufficient documentation

## 2019-12-02 DIAGNOSIS — Z87891 Personal history of nicotine dependence: Secondary | ICD-10-CM | POA: Insufficient documentation

## 2019-12-02 DIAGNOSIS — C50919 Malignant neoplasm of unspecified site of unspecified female breast: Secondary | ICD-10-CM

## 2019-12-02 DIAGNOSIS — C7951 Secondary malignant neoplasm of bone: Secondary | ICD-10-CM | POA: Insufficient documentation

## 2019-12-02 DIAGNOSIS — M810 Age-related osteoporosis without current pathological fracture: Secondary | ICD-10-CM | POA: Diagnosis not present

## 2019-12-02 DIAGNOSIS — C50412 Malignant neoplasm of upper-outer quadrant of left female breast: Secondary | ICD-10-CM | POA: Diagnosis not present

## 2019-12-02 DIAGNOSIS — K769 Liver disease, unspecified: Secondary | ICD-10-CM | POA: Diagnosis not present

## 2019-12-02 DIAGNOSIS — K625 Hemorrhage of anus and rectum: Secondary | ICD-10-CM

## 2019-12-02 DIAGNOSIS — Z853 Personal history of malignant neoplasm of breast: Secondary | ICD-10-CM | POA: Insufficient documentation

## 2019-12-02 LAB — CBC WITH DIFFERENTIAL (CANCER CENTER ONLY)
Abs Immature Granulocytes: 0 10*3/uL (ref 0.00–0.07)
Basophils Absolute: 0.1 10*3/uL (ref 0.0–0.1)
Basophils Relative: 1 %
Eosinophils Absolute: 0.1 10*3/uL (ref 0.0–0.5)
Eosinophils Relative: 2 %
HCT: 43.7 % (ref 36.0–46.0)
Hemoglobin: 14.4 g/dL (ref 12.0–15.0)
Immature Granulocytes: 0 %
Lymphocytes Relative: 22 %
Lymphs Abs: 1.2 10*3/uL (ref 0.7–4.0)
MCH: 29.9 pg (ref 26.0–34.0)
MCHC: 33 g/dL (ref 30.0–36.0)
MCV: 90.7 fL (ref 80.0–100.0)
Monocytes Absolute: 0.6 10*3/uL (ref 0.1–1.0)
Monocytes Relative: 10 %
Neutro Abs: 3.5 10*3/uL (ref 1.7–7.7)
Neutrophils Relative %: 65 %
Platelet Count: 264 10*3/uL (ref 150–400)
RBC: 4.82 MIL/uL (ref 3.87–5.11)
RDW: 13.7 % (ref 11.5–15.5)
WBC Count: 5.5 10*3/uL (ref 4.0–10.5)
nRBC: 0 % (ref 0.0–0.2)

## 2019-12-02 LAB — CMP (CANCER CENTER ONLY)
ALT: 30 U/L (ref 0–44)
AST: 34 U/L (ref 15–41)
Albumin: 3.6 g/dL (ref 3.5–5.0)
Alkaline Phosphatase: 75 U/L (ref 38–126)
Anion gap: 8 (ref 5–15)
BUN: 13 mg/dL (ref 8–23)
CO2: 27 mmol/L (ref 22–32)
Calcium: 10.7 mg/dL — ABNORMAL HIGH (ref 8.9–10.3)
Chloride: 105 mmol/L (ref 98–111)
Creatinine: 0.77 mg/dL (ref 0.44–1.00)
GFR, Est AFR Am: >60 mL/min (ref >60)
GFR, Estimated: >60 mL/min (ref >60)
Glucose, Bld: 86 mg/dL (ref 70–99)
Potassium: 4.6 mmol/L (ref 3.5–5.1)
Sodium: 140 mmol/L (ref 135–145)
Total Bilirubin: 0.4 mg/dL (ref 0.3–1.2)
Total Protein: 7.2 g/dL (ref 6.5–8.1)

## 2019-12-02 MED ORDER — PALBOCICLIB 75 MG PO TABS: 75.0000 mg | ORAL_TABLET | Freq: Every day | ORAL | 6 refills | Status: DC

## 2019-12-02 NOTE — Progress Notes (Signed)
April Rosales Female, 79 y.o., September 10, 1940 MRN:  [id number] Phone:  [phone] (H) PCP:  [name], MD Primary Cvg:  Medicare/Medicare Part A And B Next Appt With Oncology 12/08/2019 at 2:15 PM  RE: US Liver Biopsy Received: Today Arne Cleveland, MD  [place], [name]   [place] core liver lesion   DDH       Previous Messages   ----- Message -----  From: Garth Bigness D  Sent: 12/02/2019  1:05 PM EDT  To: Ir Procedure Requests  Subject: US Liver Biopsy                  Procedure:  US Liver Biopsy   Reason:  elevated liver masses, Metastatic cancer to bone, Metastasis to bone  Malignant neoplasm of upper-outer quadrant of left breast in female, estrogen receptor positive,  Carcinoma of breast metastatic to multiple sites, unspecified laterality,  Osteoporosis, unspecified osteoporosis type, unspecified pathological fracture presence    History:  [place], [place] PET in computer   Provider: Chauncey Cruel   Provider Contact: [phone]

## 2019-12-02 NOTE — Telephone Encounter (Signed)
Oral Oncology Pharmacist Encounter  Received new prescription for Ibrance (palbociclib) new start for the treatment of stage IV ER positive breast cancer in conjunction with fulvestrant, planned duration until disease progression or unacceptable drug toxicity. Planned start on 01/04/20.  CMP from 12/02/19 assessed, no relevant lab abnormalities. Prescription dose and frequency assessed.   Current medication list in Epic reviewed, no DDIs with palbociclib identified.  Prescription has been e-scribed to the [facility] for benefits analysis and approval.  Oral Oncology Clinic will continue to follow for insurance authorization, copayment issues, initial counseling and start date.  Darl Pikes, PharmD, BCPS, BCOP, CPP Hematology/Oncology Clinical Pharmacist Practitioner [facility] [phone]  12/02/2019 3:41 PM

## 2019-12-02 NOTE — Telephone Encounter (Signed)
Scheduled appts per 7/9 los. Gave pt a print out of AVS.

## 2019-12-02 NOTE — Telephone Encounter (Signed)
Oral Oncology Patient Advocate Encounter  Prior Authorization for April Rosales has been approved.    PA# [id number] Effective dates: 12/02/19 through 05/30/20  Patients co-pay is $2901.04  Oral Oncology Clinic will continue to follow.   [place] Patient [name] Phone [phone] Fax [phone] 12/02/2019 4:07 PM

## 2019-12-02 NOTE — Telephone Encounter (Signed)
Oral Oncology Patient Advocate Encounter  Received notification from [facility] that prior authorization for Leslee Home is required.  PA submitted on CoverMyMeds Key [id number] Status is pending  Oral Oncology Clinic will continue to follow.  [place] Patient [place] Phone [phone] Fax [phone] 12/02/2019 1:50 PM

## 2019-12-06 ENCOUNTER — Telehealth: Payer: Self-pay

## 2019-12-06 MED FILL — IBRANCE 75 MG TABS: 75 | 28 days supply | Qty: 21 | Fill #0

## 2019-12-06 NOTE — Telephone Encounter (Signed)
Oral Oncology Patient Advocate Encounter   Was successful in securing patient an $60 grant from [facility]) to provide copayment coverage for Ibrance.  This will keep the out of pocket expense at $0.     I have spoken with the patient.    The billing information is as follows and has been shared with [place].   Member ID: [id number]  Group ID: [id number] RxBin: [id number] Dates of Eligibility: 12/06/19 through 12/05/19  Fund:  [place] Patient [place] Phone [phone] Fax [phone] 12/06/2019 2:09 PM

## 2019-12-06 NOTE — Telephone Encounter (Addendum)
Oral Chemotherapy Pharmacist Encounter  [place] will deliver April Rosales on 12/07/19. April Rosales knows the plan is to not start until 01/04/20.  Patient Education I spoke with patient for overview of new oral chemotherapy medication: Ibrance (palbociclib) new start for the treatment of stage IV ER positive breast cancer in conjunction with fulvestrant, planned duration until disease progression or unacceptable drug toxicity. Planned start on 01/04/20.   Pt is doing well. Counseled patient on administration, dosing, side effects, monitoring, drug-food interactions, safe handling, storage, and disposal. Patient will take 1 tablet (75 mg total) by mouth daily. Take for 21 days on, 7 days off, repeat every 28 days.  Side effects include but not limited to: decreased wbc, fatigue, N/V, hair thinning.    Reviewed with patient importance of keeping a medication schedule and plan for any missed doses.  April Rosales voiced understanding and appreciation. All questions answered. Medication handout placed in the mail.  Provided patient with [facility] phone number. Patient knows to call the office with questions or concerns. Oral Chemotherapy [facility] will continue to follow.  Darl Pikes, PharmD, BCPS, BCOP, CPP Hematology/Oncology Clinical Pharmacist Practitioner [facility]/HP/AP [facility] [phone]  12/06/2019 1:39 PM

## 2019-12-08 ENCOUNTER — Telehealth: Payer: Self-pay

## 2019-12-08 ENCOUNTER — Inpatient Hospital Stay: Payer: Medicare Other

## 2019-12-08 ENCOUNTER — Other Ambulatory Visit: Payer: Self-pay

## 2019-12-08 VITALS — BP 145/82 | HR 96 | Resp 18

## 2019-12-08 DIAGNOSIS — C7951 Secondary malignant neoplasm of bone: Secondary | ICD-10-CM | POA: Diagnosis not present

## 2019-12-08 DIAGNOSIS — C50412 Malignant neoplasm of upper-outer quadrant of left female breast: Secondary | ICD-10-CM | POA: Diagnosis not present

## 2019-12-08 DIAGNOSIS — K769 Liver disease, unspecified: Secondary | ICD-10-CM | POA: Diagnosis not present

## 2019-12-08 DIAGNOSIS — M810 Age-related osteoporosis without current pathological fracture: Secondary | ICD-10-CM | POA: Diagnosis not present

## 2019-12-08 DIAGNOSIS — C50919 Malignant neoplasm of unspecified site of unspecified female breast: Secondary | ICD-10-CM

## 2019-12-08 DIAGNOSIS — Z5111 Encounter for antineoplastic chemotherapy: Secondary | ICD-10-CM | POA: Diagnosis not present

## 2019-12-08 DIAGNOSIS — Z170 Estrogen receptor positive status [ER+]: Secondary | ICD-10-CM | POA: Diagnosis not present

## 2019-12-08 MED ORDER — FULVESTRANT 250 MG/5ML IM SOLN
INTRAMUSCULAR | Status: AC
  Filled 2019-12-08: qty 10

## 2019-12-08 MED ORDER — FULVESTRANT 250 MG/5ML IM SOLN
500.0000 mg | Freq: Once | INTRAMUSCULAR | Status: AC
  Administered 2019-12-08: 500 mg via INTRAMUSCULAR

## 2019-12-08 NOTE — Telephone Encounter (Signed)
Oral Oncology Patient Advocate Encounter   Was successful in securing patient a $16000 grant from [facility] [place] (PAF) to provide copayment coverage for Ibrance.  This will keep the out of pocket expense at $0.     I have spoken with the patient.    The billing information is as follows and has been shared with [place]: [id number] PCN:  [id number] Member ID: [id number] Group ID: [id number] Dates of Eligibility: 12/08/19 through 12/07/20  [facility] [place] Phone [phone] Fax [phone] 12/08/2019 3:01 PM

## 2019-12-09 ENCOUNTER — Other Ambulatory Visit: Payer: Self-pay | Admitting: Radiology

## 2019-12-09 DIAGNOSIS — Z23 Encounter for immunization: Secondary | ICD-10-CM | POA: Diagnosis not present

## 2019-12-12 ENCOUNTER — Ambulatory Visit (HOSPITAL_COMMUNITY)
Admission: RE | Admit: 2019-12-12 | Discharge: 2019-12-12 | Disposition: A | Discharge status: Home / Self Care | Payer: Medicare Other | Source: Ambulatory Visit | Attending: Oncology | Admitting: Oncology

## 2019-12-12 ENCOUNTER — Other Ambulatory Visit: Payer: Self-pay

## 2019-12-12 ENCOUNTER — Encounter (HOSPITAL_COMMUNITY): Payer: Self-pay

## 2019-12-12 DIAGNOSIS — C50412 Malignant neoplasm of upper-outer quadrant of left female breast: Secondary | ICD-10-CM | POA: Diagnosis not present

## 2019-12-12 DIAGNOSIS — C7951 Secondary malignant neoplasm of bone: Secondary | ICD-10-CM | POA: Diagnosis not present

## 2019-12-12 DIAGNOSIS — Z79899 Other long term (current) drug therapy: Secondary | ICD-10-CM | POA: Insufficient documentation

## 2019-12-12 DIAGNOSIS — Z885 Allergy status to narcotic agent status: Secondary | ICD-10-CM | POA: Diagnosis not present

## 2019-12-12 DIAGNOSIS — Z87891 Personal history of nicotine dependence: Secondary | ICD-10-CM | POA: Insufficient documentation

## 2019-12-12 DIAGNOSIS — Z803 Family history of malignant neoplasm of breast: Secondary | ICD-10-CM | POA: Diagnosis not present

## 2019-12-12 DIAGNOSIS — K769 Liver disease, unspecified: Secondary | ICD-10-CM | POA: Diagnosis not present

## 2019-12-12 DIAGNOSIS — Z8042 Family history of malignant neoplasm of prostate: Secondary | ICD-10-CM | POA: Insufficient documentation

## 2019-12-12 DIAGNOSIS — Z853 Personal history of malignant neoplasm of breast: Secondary | ICD-10-CM | POA: Diagnosis not present

## 2019-12-12 DIAGNOSIS — Z170 Estrogen receptor positive status [ER+]: Secondary | ICD-10-CM | POA: Diagnosis not present

## 2019-12-12 DIAGNOSIS — M810 Age-related osteoporosis without current pathological fracture: Secondary | ICD-10-CM | POA: Diagnosis not present

## 2019-12-12 DIAGNOSIS — K7689 Other specified diseases of liver: Secondary | ICD-10-CM | POA: Diagnosis not present

## 2019-12-12 DIAGNOSIS — Z8041 Family history of malignant neoplasm of ovary: Secondary | ICD-10-CM | POA: Diagnosis not present

## 2019-12-12 DIAGNOSIS — C50919 Malignant neoplasm of unspecified site of unspecified female breast: Secondary | ICD-10-CM | POA: Diagnosis not present

## 2019-12-12 DIAGNOSIS — Z90710 Acquired absence of both cervix and uterus: Secondary | ICD-10-CM | POA: Insufficient documentation

## 2019-12-12 DIAGNOSIS — Z7982 Long term (current) use of aspirin: Secondary | ICD-10-CM | POA: Diagnosis not present

## 2019-12-12 DIAGNOSIS — C787 Secondary malignant neoplasm of liver and intrahepatic bile duct: Secondary | ICD-10-CM | POA: Insufficient documentation

## 2019-12-12 DIAGNOSIS — M1990 Unspecified osteoarthritis, unspecified site: Secondary | ICD-10-CM | POA: Insufficient documentation

## 2019-12-12 DIAGNOSIS — Z888 Allergy status to other drugs, medicaments and biological substances status: Secondary | ICD-10-CM | POA: Insufficient documentation

## 2019-12-12 DIAGNOSIS — C229 Malignant neoplasm of liver, not specified as primary or secondary: Secondary | ICD-10-CM | POA: Diagnosis not present

## 2019-12-12 LAB — CBC WITH DIFFERENTIAL/PLATELET
Abs Immature Granulocytes: 0.01 10*3/uL (ref 0.00–0.07)
Basophils Absolute: 0.1 10*3/uL (ref 0.0–0.1)
Basophils Relative: 1 %
Eosinophils Absolute: 0.1 10*3/uL (ref 0.0–0.5)
Eosinophils Relative: 3 %
HCT: 45.6 % (ref 36.0–46.0)
Hemoglobin: 15.4 g/dL — ABNORMAL HIGH (ref 12.0–15.0)
Immature Granulocytes: 0 %
Lymphocytes Relative: 19 %
Lymphs Abs: 0.9 10*3/uL (ref 0.7–4.0)
MCH: 30.8 pg (ref 26.0–34.0)
MCHC: 33.8 g/dL (ref 30.0–36.0)
MCV: 91.2 fL (ref 80.0–100.0)
Monocytes Absolute: 0.5 10*3/uL (ref 0.1–1.0)
Monocytes Relative: 10 %
Neutro Abs: 3.4 10*3/uL (ref 1.7–7.7)
Neutrophils Relative %: 67 %
Platelets: 259 10*3/uL (ref 150–400)
RBC: 5 MIL/uL (ref 3.87–5.11)
RDW: 14.2 % (ref 11.5–15.5)
WBC: 5 10*3/uL (ref 4.0–10.5)
nRBC: 0 % (ref 0.0–0.2)

## 2019-12-12 LAB — COMPREHENSIVE METABOLIC PANEL
ALT: 47 U/L — ABNORMAL HIGH (ref 0–44)
AST: 41 U/L (ref 15–41)
Albumin: 4.1 g/dL (ref 3.5–5.0)
Alkaline Phosphatase: 89 U/L (ref 38–126)
Anion gap: 12 (ref 5–15)
BUN: 14 mg/dL (ref 8–23)
CO2: 24 mmol/L (ref 22–32)
Calcium: 10.7 mg/dL — ABNORMAL HIGH (ref 8.9–10.3)
Chloride: 105 mmol/L (ref 98–111)
Creatinine, Ser: 0.57 mg/dL (ref 0.44–1.00)
GFR calc Af Amer: >60 mL/min (ref >60)
GFR calc non Af Amer: >60 mL/min (ref >60)
Glucose, Bld: 110 mg/dL — ABNORMAL HIGH (ref 70–99)
Potassium: 4 mmol/L (ref 3.5–5.1)
Sodium: 141 mmol/L (ref 135–145)
Total Bilirubin: 0.5 mg/dL (ref 0.3–1.2)
Total Protein: 7.6 g/dL (ref 6.5–8.1)

## 2019-12-12 LAB — PROTIME-INR
INR: 1 (ref 0.8–1.2)
Prothrombin Time: 13 seconds (ref 11.4–15.2)

## 2019-12-12 MED ORDER — MIDAZOLAM HCL 2 MG/2ML IJ SOLN
INTRAMUSCULAR | Status: AC | PRN
  Administered 2019-12-12: 1 mg via INTRAVENOUS

## 2019-12-12 MED ORDER — FENTANYL CITRATE (PF) 100 MCG/2ML IJ SOLN
INTRAMUSCULAR | Status: AC | PRN
  Administered 2019-12-12: 25 ug via INTRAVENOUS

## 2019-12-12 MED ORDER — SODIUM CHLORIDE 0.9 % IV SOLN: INTRAVENOUS | Status: DC

## 2019-12-12 MED ORDER — FENTANYL CITRATE (PF) 100 MCG/2ML IJ SOLN
INTRAMUSCULAR | Status: AC
  Filled 2019-12-12: qty 2

## 2019-12-12 MED ORDER — LIDOCAINE HCL 1 % IJ SOLN
INTRAMUSCULAR | Status: AC
  Filled 2019-12-12: qty 20

## 2019-12-12 MED ORDER — MIDAZOLAM HCL 2 MG/2ML IJ SOLN
INTRAMUSCULAR | Status: AC
  Filled 2019-12-12: qty 2

## 2019-12-12 NOTE — Procedures (Signed)
  Procedure: US core liver lesion biopsy 18g x3 EBL:   minimal Complications:  none immediate  See full dictation in [facility] PACS.  D. Thelma Viana MD Main # [phone] Pager  [phone]    

## 2019-12-12 NOTE — Discharge Instructions (Signed)
Please call Interventional Radiology clinic [phone] with any questions or concerns.  You may remove your dressing and shower tomorrow.  Liver Biopsy, Care After These instructions give you information on caring for yourself after your procedure. Your doctor may also give you more specific instructions. Call your doctor if you have any problems or questions after your procedure. What can I expect after the procedure? After the procedure, it is common to have:  Pain and soreness where the biopsy was done.  Bruising around the area where the biopsy was done.  Sleepiness and be tired for a few days. Follow these instructions at home: Medicines  Take over-the-counter and prescription medicines only as told by your doctor.  If you were prescribed an antibiotic medicine, take it as told by your doctor. Do not stop taking the antibiotic even if you start to feel better.  Do not take medicines such as aspirin and ibuprofen. These medicines can thin your blood. Do not take these medicines unless your doctor tells you to take them.  If you are taking prescription pain medicine, take actions to prevent or treat constipation. Your doctor may recommend that you: ? Drink enough fluid to keep your pee (urine) clear or pale yellow. ? Take over-the-counter or prescription medicines. ? Eat foods that are high in fiber, such as fresh fruits and vegetables, whole grains, and beans. ? Limit foods that are high in fat and processed sugars, such as fried and sweet foods. Caring for your cut  Follow instructions from your doctor about how to take care of your cuts from surgery (incisions). Make sure you: ? Wash your hands with soap and water before you change your bandage (dressing). If you cannot use soap and water, use hand sanitizer. ? Change your bandage as told by your doctor. ? Leave stitches (sutures), skin glue, or skin tape (adhesive) strips in place. They may need to stay in place for 2 weeks  or longer. If tape strips get loose and curl up, you may trim the loose edges. Do not remove tape strips completely unless your doctor says it is okay.  Check your cuts every day for signs of infection. Check for: ? Redness, swelling, or more pain. ? Fluid or blood. ? Pus or a bad smell. ? Warmth.  Do not take baths, swim, or use a hot tub until your doctor says it is okay to do so. Activity   Rest at home for 1-2 days or as told by your doctor. ? Avoid sitting for a long time without moving. Get up to take short walks every 1-2 hours.  Return to your normal activities as told by your doctor. Ask what activities are safe for you.  Do not do these things in the first 24 hours: ? Drive. ? Use machinery. ? Take a bath or shower.  Do not lift more than 10 pounds (4.5 kg) or play contact sports for the first 2 weeks. General instructions   Do not drink alcohol in the first week after the procedure.  Have someone stay with you for at least 24 hours after the procedure.  Get your test results. Ask your doctor or the department that is doing the test: ? When will my results be ready? ? How will I get my results? ? What are my treatment options? ? What other tests do I need? ? What are my next steps?  Keep all follow-up visits as told by your doctor. This is important. Contact a doctor if:    A cut bleeds and leaves more than just a small spot of blood.  A cut is red, puffs up (swells), or hurts more than before.  Fluid or something else comes from a cut.  A cut smells bad.  You have a fever or chills. Get help right away if:  You have swelling, bloating, or pain in your belly (abdomen).  You get dizzy or faint.  You have a rash.  You feel sick to your stomach (nauseous) or throw up (vomit).  You have trouble breathing, feel short of breath, or feel faint.  Your chest hurts.  You have problems talking or seeing.  You have trouble with your balance or moving your  arms or legs. Summary  After the procedure, it is common to have pain, soreness, bruising, and tiredness.  Your doctor will tell you how to take care of yourself at home. Change your bandage, take your medicines, and limit your activities as told by your doctor.  Call your doctor if you have symptoms of infection. Get help right away if your belly swells, your cut bleeds a lot, or you have trouble talking or breathing. This information is not intended to replace advice given to you by your health care provider. Make sure you discuss any questions you have with your health care provider. Document Revised: 05/22/2017 Document Reviewed: 05/22/2017 Elsevier Patient Education  2020 Elsevier Inc.   Moderate Conscious Sedation, Adult, Care After These instructions provide you with information about caring for yourself after your procedure. Your health care provider may also give you more specific instructions. Your treatment has been planned according to current medical practices, but problems sometimes occur. Call your health care provider if you have any problems or questions after your procedure. What can I expect after the procedure? After your procedure, it is common:  To feel sleepy for several hours.  To feel clumsy and have poor balance for several hours.  To have poor judgment for several hours.  To vomit if you eat too soon. Follow these instructions at home: For at least 24 hours after the procedure:   Do not: ? Participate in activities where you could fall or become injured. ? Drive. ? Use heavy machinery. ? Drink alcohol. ? Take sleeping pills or medicines that cause drowsiness. ? Make important decisions or sign legal documents. ? Take care of children on your own.  Rest. Eating and drinking  Follow the diet recommended by your health care provider.  If you vomit: ? Drink water, juice, or soup when you can drink without vomiting. ? Make sure you have little or no  nausea before eating solid foods. General instructions  Have a responsible adult stay with you until you are awake and alert.  Take over-the-counter and prescription medicines only as told by your health care provider.  If you smoke, do not smoke without supervision.  Keep all follow-up visits as told by your health care provider. This is important. Contact a health care provider if:  You keep feeling nauseous or you keep vomiting.  You feel light-headed.  You develop a rash.  You have a fever. Get help right away if:  You have trouble breathing. This information is not intended to replace advice given to you by your health care provider. Make sure you discuss any questions you have with your health care provider. Document Revised: 04/24/2017 Document Reviewed: 09/01/2015 Elsevier Patient Education  2020 Elsevier Inc.  

## 2019-12-12 NOTE — Progress Notes (Signed)
Chief Complaint: Patient was seen in consultation today for liver lesion biopsy   Referring Physician(s): Magrinat,Gustav C  Supervising Physician: Arne Cleveland  Patient Status: [facility] - Out-pt  History of Present Illness: April Rosales is a 79 y.o. female with metastatic breast cancer found to have multiple hepatic lesions. She is referred for liver lesion biopsy. PMHx, meds, labs, imaging, allergies reviewed. Feels well, no recent fevers, chills, illness. Has been NPO today as directed.   Past Medical History:  Diagnosis Date  . Arthritis   . Breast cancer [facility]) 1985/2016   takes [name] daily  . Chronic back pain    stenosis/listhesis  . Family history of ovarian cancer   . Osteoporosis    takes Vit D  . Radiation 11/23/14-12/11/14   30 Gy T1-T5     Past Surgical History:  Procedure Laterality Date  . ABDOMINAL HYSTERECTOMY     1980  . APPENDECTOMY  1977  . APPLICATION OF INTRAOPERATIVE CT SCAN N/A 10/20/2014   Procedure: APPLICATION OF INTRAOPERATIVE CAT SCAN;  Surgeon: [name], MD;  Location: [place] NEURO ORS;  Service: Neurosurgery;  Laterality: N/A;  . BREAST LUMPECTOMY WITH RADIOACTIVE SEED LOCALIZATION Left 05/03/2018   Procedure: LEFT BREAST LUMPECTOMY WITH BRACKETED RADIOACTIVE SEED LOCALIZATION;  Surgeon: [name], MD;  Location: [place];  Service: General;  Laterality: Left;  . BREAST SURGERY Right 1985  . THYROID CYST EXCISION  1967    Allergies: Ativan [lorazepam], Dilaudid [hydromorphone hcl], and Haldol [haloperidol lactate]  Medications: Prior to Admission medications   Medication Sig Start Date End Date Taking? Authorizing Provider  Ascorbic Acid (VITAMIN C PO) Take 1 tablet by mouth every other day. Takes qod    [provider]  aspirin EC 325 MG tablet Take 1 tablet (325 mg total) by mouth daily. 03/09/15   Magrinat, [name], MD  Cholecalciferol (VITAMIN D3 ADULT GUMMIES PO) Take 2,000 Units by mouth  daily.     [provider]  Cyanocobalamin (VITAMIN B-12 PO) Take by mouth daily. Takes every other day    [provider]  Iron-Vitamins (GERITOL PO) Take by mouth daily.    [provider]  palbociclib (IBRANCE) 75 MG tablet Take 1 tablet (75 mg total) by mouth daily. Take for 21 days on, 7 days off, repeat every 28 days. Start 01/04/2020 12/02/19   Magrinat, [name], MD     Family History  Problem Relation Age of Onset  . Heart disease Mother   . Hypertension Mother   . Heart disease Father   . Diabetes Father   . Breast cancer Paternal Aunt        4 paternal aunts with breast cancer over 37  . Prostate cancer Paternal Uncle   . Stroke Paternal Grandfather   . Ovarian cancer Paternal Aunt   . Huntington's disease Other        Nephew, inherited from his father    Social History   Socioeconomic History  . Marital status: Divorced    Spouse name: Not on file  . Number of children: 1  . Years of education: Not on file  . Highest education level: Not on file  Occupational History  . Not on file  Tobacco Use  . Smoking status: Former Smoker    Packs/day: 0.50    Years: 10.00    Pack years: 5.00    Types: Cigarettes    Quit date: 05/03/1970    Years since quitting: 49.6  . Smokeless  tobacco: Former Systems developer  . Tobacco comment: quit smoking 1970's  Substance and Sexual Activity  . Alcohol use: Yes    Alcohol/week: 0.0 standard drinks    Comment: Rare  . Drug use: No  . Sexual activity: Not on file  Other Topics Concern  . Not on file  Social History Narrative  . Not on file   Social Determinants of Health   Financial Resource Strain:   . Difficulty of Paying Living Expenses:   Food Insecurity:   . Worried About Charity fundraiser in the Last Year:   . Arboriculturist in the Last Year:   Transportation Needs:   . Film/video editor (Medical):   Marland Kitchen Lack of Transportation (Non-Medical):   Physical Activity:   . Days of Exercise per Week:    . Minutes of Exercise per Session:   Stress:   . Feeling of Stress :   Social Connections:   . Frequency of Communication with Friends and Family:   . Frequency of Social Gatherings with Friends and Family:   . Attends Religious Services:   . Active Member of Clubs or Organizations:   . Attends Archivist Meetings:   Marland Kitchen Marital Status:      Review of Systems: A 12 point ROS discussed and pertinent positives are indicated in the HPI above.  All other systems are negative.  Review of Systems  Vital Signs: BP (!) 167/97   Pulse (!) 110   Temp 98 F (36.7 C) (Oral)   Resp 16   SpO2 96%   Physical Exam Constitutional:      Appearance: Normal appearance.  HENT:     Mouth/Throat:     Mouth: Mucous membranes are moist.     Pharynx: Oropharynx is clear.  Cardiovascular:     Rate and Rhythm: Normal rate and regular rhythm.     Heart sounds: Normal heart sounds.  Pulmonary:     Effort: Pulmonary effort is normal. No respiratory distress.     Breath sounds: Normal breath sounds.  Abdominal:     General: There is no distension.     Palpations: Abdomen is soft. There is no mass.     Tenderness: There is no abdominal tenderness.  Skin:    General: Skin is warm and dry.  Neurological:     General: No focal deficit present.     Mental Status: She is alert and oriented to person, place, and time.  Psychiatric:        Mood and Affect: Mood normal.        Thought Content: Thought content normal.        Judgment: Judgment normal.      Imaging: No results found.  Labs:  CBC: Recent Labs    12/29/18 1411 10/03/19 1205 12/02/19 1147 12/12/19 1130  WBC 5.4 4.7 5.5 5.0  HGB 14.9 15.3 14.4 15.4*  HCT 44.3 46.7* 43.7 45.6  PLT 246 301 264 259    COAGS: Recent Labs    12/12/19 1130  INR 1.0    BMP: Recent Labs    02/08/19 1145 10/03/19 1205 12/02/19 1147 12/12/19 1130  NA 141 140 140 141  K 4.3 4.5 4.6 4.0  CL 104 103 105 105  CO2 28 31 27 24     GLUCOSE 84 87 86 110*  BUN 13 13 13 14   CALCIUM 10.4 10.8* 10.7* 10.7*  CREATININE 0.68 0.65 0.77 0.57  GFRNONAA 83 84 >60 >60  GFRAA 96  97 >60 >60    LIVER FUNCTION TESTS: Recent Labs    12/29/18 1411 02/08/19 1145 12/02/19 1147 12/12/19 1130  BILITOT 0.3 0.4 0.4 0.5  AST 19 19 34 41  ALT 16 13 30  47*  ALKPHOS 38  --  75 89  PROT 7.0 7.0 7.2 7.6  ALBUMIN 3.8  --  3.6 4.1    TUMOR MARKERS: No results for input(s): AFPTM, CEA, CA199, CHROMGRNA in the last 8760 hours.  Assessment and Plan: Hepatic lesions Hx of metastatic breast cancer For US guided biopsy Labs ok Risks and benefits of liver bx was discussed with the patient and/or patient's family including, but not limited to bleeding, infection, damage to adjacent structures or low yield requiring additional tests.  All of the questions were answered and there is agreement to proceed.  Consent signed and in chart.    Thank you for this interesting consult.  I greatly enjoyed meeting April Rosales and look forward to participating in their care.  A copy of this report was sent to the requesting provider on this date.  Electronically Signed: Ascencion Dike, PA-C 12/12/2019, 12:42 PM   I spent a total of 20 minutes in face to face in clinical consultation, greater than 50% of which was counseling/coordinating care for liver bx

## 2019-12-15 ENCOUNTER — Other Ambulatory Visit: Payer: Self-pay | Admitting: Oncology

## 2019-12-15 NOTE — Progress Notes (Signed)
I called April Rosales with the preliminary results of her liver biopsy (we do not have the HER-2 finalized yet).  The tumor is indeed ER positive.  She received her first Faslodex last week will receive the second dose 12/22/2019 and then the third dose, which will be monthly thereafter on 01/04/2020.  Since she already received the Ibrance I suggest that she started every other day beginning 12/20/2019.  She is agreeable to this.

## 2019-12-19 LAB — SURGICAL PATHOLOGY

## 2019-12-22 ENCOUNTER — Other Ambulatory Visit: Payer: Self-pay

## 2019-12-22 ENCOUNTER — Inpatient Hospital Stay: Payer: Medicare Other

## 2019-12-22 VITALS — BP 125/70 | HR 104 | Temp 98.1°F | Resp 18

## 2019-12-22 DIAGNOSIS — C50412 Malignant neoplasm of upper-outer quadrant of left female breast: Secondary | ICD-10-CM | POA: Diagnosis not present

## 2019-12-22 DIAGNOSIS — M810 Age-related osteoporosis without current pathological fracture: Secondary | ICD-10-CM

## 2019-12-22 DIAGNOSIS — C7951 Secondary malignant neoplasm of bone: Secondary | ICD-10-CM | POA: Diagnosis not present

## 2019-12-22 DIAGNOSIS — K769 Liver disease, unspecified: Secondary | ICD-10-CM | POA: Diagnosis not present

## 2019-12-22 DIAGNOSIS — Z5111 Encounter for antineoplastic chemotherapy: Secondary | ICD-10-CM | POA: Diagnosis not present

## 2019-12-22 DIAGNOSIS — Z170 Estrogen receptor positive status [ER+]: Secondary | ICD-10-CM | POA: Diagnosis not present

## 2019-12-22 DIAGNOSIS — C50919 Malignant neoplasm of unspecified site of unspecified female breast: Secondary | ICD-10-CM

## 2019-12-22 MED ORDER — FULVESTRANT 250 MG/5ML IM SOLN: 500.0000 mg | Freq: Once | INTRAMUSCULAR | Status: DC

## 2019-12-22 MED ORDER — FULVESTRANT 250 MG/5ML IM SOLN
INTRAMUSCULAR | Status: AC
  Filled 2019-12-22: qty 10

## 2019-12-22 MED ORDER — FULVESTRANT 250 MG/5ML IM SOLN
500.0000 mg | Freq: Once | INTRAMUSCULAR | Status: AC
  Administered 2019-12-22: 500 mg via INTRAMUSCULAR

## 2019-12-22 NOTE — Patient Instructions (Signed)
Fulvestrant injection What is this medicine? FULVESTRANT (ful VES trant) blocks the effects of estrogen. It is used to treat breast cancer. This medicine may be used for other purposes; ask your health care provider or pharmacist if you have questions. COMMON BRAND NAME(S): FASLODEX What should I tell my health care provider before I take this medicine? They need to know if you have any of these conditions:  bleeding disorders  liver disease  low blood counts, like low white cell, platelet, or red cell counts  an unusual or allergic reaction to fulvestrant, other medicines, foods, dyes, or preservatives  pregnant or trying to get pregnant  breast-feeding How should I use this medicine? This medicine is for injection into a muscle. It is usually given by a health care professional in a hospital or clinic setting. Talk to your pediatrician regarding the use of this medicine in children. Special care may be needed. Overdosage: If you think you have taken too much of this medicine contact a poison control center or emergency room at once. NOTE: This medicine is only for you. Do not share this medicine with others. What if I miss a dose? It is important not to miss your dose. Call your doctor or health care professional if you are unable to keep an appointment. What may interact with this medicine?  medicines that treat or prevent blood clots like warfarin, enoxaparin, dalteparin, apixaban, dabigatran, and rivaroxaban This list may not describe all possible interactions. Give your health care provider a list of all the medicines, herbs, non-prescription drugs, or dietary supplements you use. Also tell them if you smoke, drink alcohol, or use illegal drugs. Some items may interact with your medicine. What should I watch for while using this medicine? Your condition will be monitored carefully while you are receiving this medicine. You will need important blood work done while you are taking  this medicine. Do not become pregnant while taking this medicine or for at least 1 year after stopping it. Women of child-bearing potential will need to have a negative pregnancy test before starting this medicine. Women should inform their doctor if they wish to become pregnant or think they might be pregnant. There is a potential for serious side effects to an unborn child. Men should inform their doctors if they wish to father a child. This medicine may lower sperm counts. Talk to your health care professional or pharmacist for more information. Do not breast-feed an infant while taking this medicine or for 1 year after the last dose. What side effects may I notice from receiving this medicine? Side effects that you should report to your doctor or health care professional as soon as possible:  allergic reactions like skin rash, itching or hives, swelling of the face, lips, or tongue  feeling faint or lightheaded, falls  pain, tingling, numbness, or weakness in the legs  signs and symptoms of infection like fever or chills; cough; flu-like symptoms; sore throat  vaginal bleeding Side effects that usually do not require medical attention (report to your doctor or health care professional if they continue or are bothersome):  aches, pains  constipation  diarrhea  headache  hot flashes  nausea, vomiting  pain at site where injected  stomach pain This list may not describe all possible side effects. Call your doctor for medical advice about side effects. You may report side effects to FDA at 1-800-FDA-1088. Where should I keep my medicine? This drug is given in a hospital or clinic and will   not be stored at home. NOTE: This sheet is a summary. It may not cover all possible information. If you have questions about this medicine, talk to your doctor, pharmacist, or health care provider.  2020 Elsevier/Gold Standard (2017-08-20 11:34:41)  

## 2020-01-03 NOTE — Progress Notes (Signed)
[place]  Telephone:([phone]) [phone] Fax:(336) [phone]    ID: April Rosales DOB: April 02, 1941  MR#: [id number]  JYN#:[id number]  Patient Care Team: [name], MD as PCP - General (Internal Medicine) Clydean Posas, [name], MD as Consulting Physician (Oncology) [name], MD as Consulting Physician (Orthopedic Surgery) [name], MD as Consulting Physician (General Surgery) OTHER: [name], DDS   CHIEF COMPLAINT: Stage IV estrogen receptor positive breast cancer  CURRENT TREATMENT: Fulvestrant, palbociclib   INTERVAL HISTORY: April Rosales was contacted today for follow-up of her stage IV estrogen receptor positive breast cancer, accompanied by her sister..    We health the fulvestrant and palbociclib because of the pandemic.  She was not on letrozole.  We then noted liver lesions, andsince her last visit, she underwent liver biopsy on 12/12/2019. Pathology from the procedure [phone]) confirmed mammary carcinoma. Prognostic indicators significant for: estrogen receptor 95% positive, and progesterone receptor 5% positive, both with strong staining intensity. Proliferation marker Ki-67 of 10%. Her2 equivocal by immunohistochemistry (2+), but positive by FISH.  She then resumed Faslodex 12/08/2019.  She also started palbociclib 75 mg, initially every other day, 21 days on 7 days off.    REVIEW OF SYSTEMS:  April Rosales is tolerating both the Faslodex and the palbociclib with no side effects that she is aware of.  She has no fatigue no nausea and no pain.  She has had no hot flashes.  There has been no hair loss.  A detailed review of systems today was otherwise stable.   BREAST CANCER HISTORY: From the original intake note:   April Rosales underwent right lumpectomy in 1985 at [facility] for what sounds like a stage I breast cancer. She tells me she had more than 30 lymph nodes removed from her right axilla and all of them were clear. She received   adjuvant radiation but no systemic treatment.  The patient had recently refused mammography with the last mammogram I can find dating back to August 2010.  More recently the patient presented with left scapular pain radiating down the left arm.  She was evaluated by Dr. [name], who obtained a chest x-ray showing a possible abnormality at T3. He then set up the patient for an MRI of the thoracic spine performed 08/16/2014.  This showed multiple compression fractures  (a similar picture had been noted on lumbar MRI 10/09/2011 ). However at T3 they noted tumor in the vertebral body extending into the left pedicle and into the lateral epidural space, displacing the cord to the right. There was no evidence of cord compression or cord signal abnormality. There were no other areas of tumor identified in the thoracic spine.         The patient was then referred to Dr. Hal Neer who on 08/24/2014 set April Rosales up for CT scans of the chest, abdomen and pelvis. There was a dense mass in the upper inner quadrant of the left breast measuring 1.6 cm. There was a 1.2 cm nodule in the minor fissure of the right lung and some evidence of right lung fibrosis at the site of the prior radiation port.  There was also a thyroid mass measuring 2 cm. However there were no parenchymal lung or liver lesions. Incidental meningoceles were noted as well as sclerosis of the fifth and sixth ribs which were felt to be likely posttraumatic.   On 08/29/2014 the patient underwent bilateral diagnostic mammography with tomosynthesis and left breast ultrasonography.  There were postsurgical changes in the upper  right breast. In the left breast there was an irregular mass measuring 2.3 cm in the upper inner quadrant. This was palpable. Ultrasound showed this to be hypoechoic and to measure 2.0 cm. There were adjacent areas of nodularity. There was no definite lymphadenopathy in the left axilla.  Biopsy of this breast mass 08/29/2014 showed an  invasive adenocarcinoma with both ductal and lobular features (there was strong diffuse E-cadherin expression as well as areas with total absence of E-cadherin expression), with the preliminary prognostic profile showing strong estrogen positivity, very weak to near absent progesterone positivity, an MIB-1 of approximately 40%, and HER-2 equivocal  The patient's subsequent history is as detailed below.   PAST MEDICAL HISTORY: Past Medical History:  Diagnosis Date  . Arthritis   . Breast cancer [facility]) 1985/2016   takes [name] daily  . Chronic back pain    stenosis/listhesis  . Family history of ovarian cancer   . Osteoporosis    takes Vit D  . Radiation 11/23/14-12/11/14   30 Gy T1-T5     PAST SURGICAL HISTORY: Past Surgical History:  Procedure Laterality Date  . ABDOMINAL HYSTERECTOMY     1980  . APPENDECTOMY  1977  . APPLICATION OF INTRAOPERATIVE CT SCAN N/A 10/20/2014   Procedure: APPLICATION OF INTRAOPERATIVE CAT SCAN;  Surgeon: [name], MD;  Location: [facility];  Service: Neurosurgery;  Laterality: N/A;  . BREAST LUMPECTOMY WITH RADIOACTIVE SEED LOCALIZATION Left 05/03/2018   Procedure: LEFT BREAST LUMPECTOMY WITH BRACKETED RADIOACTIVE SEED LOCALIZATION;  Surgeon: [name], MD;  Location: [facility];  Service: General;  Laterality: Left;  . BREAST SURGERY Right 1985  . THYROID CYST EXCISION  1967    FAMILY HISTORY Family History  Problem Relation Age of Onset  . Heart disease Mother   . Hypertension Mother   . Heart disease Father   . Diabetes Father   . Breast cancer Paternal Aunt        4 paternal aunts with breast cancer over 84  . Prostate cancer Paternal Uncle   . Stroke Paternal Grandfather   . Ovarian cancer Paternal Aunt   . Huntington's disease Other        Nephew, inherited from his father   The patient's father died from a heart attack at the age of 43. He had 9 sisters. 3 of those sisters had breast cancer, all in a menopausal  setting. Another sister had ovarian cancer. One of the paternal uncles had cancer of the colon "and back".  The patient's mother died at the age of 74. She was found to have breast cancer shortly before dying , during her final hospitalization.   GYNECOLOGIC HISTORY:  No LMP recorded. Patient has had a hysterectomy.  Menarche age 62, first live birth age 55, the patient is GX P1. She underwent hysterectomy in 1980. She thinks the ovaries were removed, but the CT scan obtained 08/24/2014 showed a definite right ovary. The left ovary may have been removed. She did not take hormone replacement after the hysterectomy.   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 She is divorced, lives alone, with 2 cats. Her son Bethann Berkshire lives in [place] where he works in Engineer, technical sales. He has 4 children of his own. The patient is a Psychologist, forensic.   ADVANCED DIRECTIVES:  In place. The patient has named her sister Pleas Koch [phone]) and her friend Janina Mayo [phone]) as joint healthcare powers of attorney   HEALTH MAINTENANCE: Social History   Tobacco Use  .  Smoking status: Former Smoker    Packs/day: 0.50    Years: 10.00    Pack years: 5.00    Types: Cigarettes    Quit date: 05/03/1970    Years since quitting: 49.7  . Smokeless tobacco: Former Systems developer  . Tobacco comment: quit smoking 1970's  Substance Use Topics  . Alcohol use: Yes    Alcohol/week: 0.0 standard drinks    Comment: Rare  . Drug use: No     Colonoscopy: never  PAP: status post hysterectomy  Bone density: 09/12/2016 [facility]/ T score of -4.8 osteoporosis  Lipid panel:  Allergies  Allergen Reactions  . Ativan [Lorazepam]     Made her crazy  . Dilaudid [Hydromorphone Hcl]     Doesn't want  . Haldol [Haloperidol Lactate]     Made her crazy    Current Outpatient Medications  Medication Sig Dispense Refill  . Ascorbic Acid (VITAMIN C PO) Take 1 tablet by mouth every other day. Takes qod    . aspirin EC 325  MG tablet Take 1 tablet (325 mg total) by mouth daily. 30 tablet 0  . Cholecalciferol (VITAMIN D3 ADULT GUMMIES PO) Take 2,000 Units by mouth daily.     . Cyanocobalamin (VITAMIN B-12 PO) Take by mouth daily. Takes every other day    . Iron-Vitamins (GERITOL PO) Take by mouth daily.    . palbociclib (IBRANCE) 75 MG tablet Take 1 tablet (75 mg total) by mouth daily. Take for 21 days on, 7 days off, repeat every 28 days. Start 01/04/2020 21 tablet 6   No current facility-administered medications for this visit.    OBJECTIVE: White woman who appears stated age  13:   01/04/20 1307  BP: 136/85  Pulse: 96  Resp: 16  Temp: 98.3 F (36.8 C)  SpO2: 98%   Wt Readings from Last 3 Encounters:  01/04/20 122 lb (55.3 kg)  12/02/19 122 lb (55.3 kg)  11/09/19 123 lb (55.8 kg)   Body mass index is 22.68 kg/m.    ECOG FS:1 - Symptomatic but completely ambulatory  Sclerae unicteric, EOMs intact Wearing a mask No cervical or supraclavicular adenopathy Lungs no rales or rhonchi Heart regular rate and rhythm Abd soft, nontender, positive bowel sounds MSK kyphosis but no focal spinal tenderness, no upper extremity lymphedema Neuro: nonfocal, well oriented, appropriate affect Breasts: Deferred   LAB RESULTS:  CMP     Component Value Date/Time   NA 139 01/04/2020 1222   NA 141 05/29/2017 1417   K 4.0 01/04/2020 1222   K 3.6 05/29/2017 1417   CL 105 01/04/2020 1222   CO2 26 01/04/2020 1222   CO2 27 05/29/2017 1417   GLUCOSE 99 01/04/2020 1222   GLUCOSE 111 05/29/2017 1417   BUN 13 01/04/2020 1222   BUN 17.0 05/29/2017 1417   CREATININE 0.75 01/04/2020 1222   CREATININE 0.77 12/02/2019 1147   CREATININE 0.65 10/03/2019 1205   CREATININE 0.8 05/29/2017 1417   CALCIUM 10.7 (H) 01/04/2020 1222   CALCIUM 10.7 (H) 06/26/2017 1216   CALCIUM 10.4 05/29/2017 1417   PROT 7.0 01/04/2020 1222   PROT 7.3 05/29/2017 1417   ALBUMIN 3.4 (L) 01/04/2020 1222   ALBUMIN 3.9 05/29/2017 1417     AST 22 01/04/2020 1222   AST 34 12/02/2019 1147   AST 17 05/29/2017 1417   ALT 19 01/04/2020 1222   ALT 30 12/02/2019 1147   ALT 15 05/29/2017 1417   ALKPHOS 90 01/04/2020 1222   ALKPHOS 42 05/29/2017 1417  BILITOT 0.5 01/04/2020 1222   BILITOT 0.4 12/02/2019 1147   BILITOT 0.30 05/29/2017 1417   GFRNONAA >60 01/04/2020 1222   GFRNONAA >60 12/02/2019 1147   GFRNONAA 84 10/03/2019 1205   GFRAA >60 01/04/2020 1222   GFRAA >60 12/02/2019 1147   GFRAA 97 10/03/2019 1205    INo results found for: SPEP, UPEP  Lab Results  Component Value Date   WBC 3.8 (L) 01/04/2020   NEUTROABS 2.6 01/04/2020   HGB 13.9 01/04/2020   HCT 42.0 01/04/2020   MCV 92.1 01/04/2020   PLT 206 01/04/2020      Chemistry      Component Value Date/Time   NA 139 01/04/2020 1222   NA 141 05/29/2017 1417   K 4.0 01/04/2020 1222   K 3.6 05/29/2017 1417   CL 105 01/04/2020 1222   CO2 26 01/04/2020 1222   CO2 27 05/29/2017 1417   BUN 13 01/04/2020 1222   BUN 17.0 05/29/2017 1417   CREATININE 0.75 01/04/2020 1222   CREATININE 0.77 12/02/2019 1147   CREATININE 0.65 10/03/2019 1205   CREATININE 0.8 05/29/2017 1417      Component Value Date/Time   CALCIUM 10.7 (H) 01/04/2020 1222   CALCIUM 10.7 (H) 06/26/2017 1216   CALCIUM 10.4 05/29/2017 1417   ALKPHOS 90 01/04/2020 1222   ALKPHOS 42 05/29/2017 1417   AST 22 01/04/2020 1222   AST 34 12/02/2019 1147   AST 17 05/29/2017 1417   ALT 19 01/04/2020 1222   ALT 30 12/02/2019 1147   ALT 15 05/29/2017 1417   BILITOT 0.5 01/04/2020 1222   BILITOT 0.4 12/02/2019 1147   BILITOT 0.30 05/29/2017 1417       Lab Results  Component Value Date   LABCA2 24 01/01/2016    No components found for: [id number]  No results for input(s): INR in the last 168 hours.  Urinalysis    Component Value Date/Time   COLORURINE YELLOW 10/19/2019 1120   APPEARANCEUR TURBID (A) 10/19/2019 1120   LABSPEC 1.013 10/19/2019 1120   PHURINE 6.5 10/19/2019 1120    GLUCOSEU NEGATIVE 10/19/2019 1120   HGBUR 1+ (A) 10/19/2019 1120   BILIRUBINUR NEGATIVE 09/19/2015 1239   KETONESUR NEGATIVE 10/19/2019 1120   PROTEINUR 1+ (A) 10/19/2019 1120   NITRITE NEGATIVE 10/19/2019 1120   LEUKOCYTESUR 3+ (A) 10/19/2019 1120    STUDIES: [place] CORE BIOPSY (LIVER)  Result Date: 12/12/2019 CLINICAL DATA:  History of breast carcinoma. New liver lesions. Biopsy requested EXAM: ULTRASOUND-GUIDED CORE LIVER BIOPSY TECHNIQUE: An ultrasound guided liver biopsy was thoroughly discussed with the patient and questions were answered. The benefits, risks, alternatives, and complications were also discussed. The patient understands and wishes to proceed with the procedure. A verbal as well as written consent was obtained. Survey ultrasound of the liver was performed, a representative right lobe lesion localized, and an appropriate skin entry site was determined. Skin site was marked, prepped with chlorhexidine, and draped in usual sterile fashion, and infiltrated locally with 1% lidocaine. Intravenous Fentanyl 60mg and Versed 199mwere administered as conscious sedation during continuous monitoring of the patient's level of consciousness and physiological / cardiorespiratory status by the radiology RN, with a total moderate sedation time of 10 minutes. A 17 gauge trocar needle was advanced under ultrasound guidance into the liver to the margin of the lesion. Four coaxial 18gauge core samples were then obtained through the guide needle. The guide needle was removed. Post procedure scans demonstrate no apparent complication. COMPLICATIONS: COMPLICATIONS None immediate FINDINGS: Hypoechoic  liver lesion localized corresponding to PET-CT findings. Representative core biopsy samples obtained as above. IMPRESSION: 1. Technically successful ultrasound guided core liver lesion biopsy. Electronically Signed   By: Lucrezia Europe M.D.   On: 12/12/2019 15:33     ASSESSMENT: 79 y.o. April Rosales woman with stage  IV left breast cancer involving bone  (1) status post right lumpectomy and axillary node dissection in 1985 followed by radiation at [place] [facility] 08/16/2014: measurable disease in spine, lung and left breast   (2) evaluation for left shoulder pain led to thoracic spine MRI 08/16/2014 showing a pathologic fracture at T3 with epidural tumor displacing the cord to the right, but no cord compression. CT scans of the chest, abdomen and pelvis 08/24/2014 showed in addition a mass in the upper outer quadrant left breast measuring 1.6 cm and a nodule in the minor fissure of the right lung measuring 1.2 cm, but no parenchymal lung or liver lesions.   (a) CA 27-29 was noninformative at 38 (09/20/2014)    (3) mammography and ultrasonography 08/29/2014 show a mass in the upper inner left breast which was palpable,  measuring 2.0 cm by ultrasound. Biopsy of this mass 08/29/2014 showed an invasive breast cancer with both lobular and ductal features, estrogen receptor positive, progesterone receptor weakly positive, with an MIB-1 in the 40% range, HER-2 equivocal (6 else ratio 1.5, but average number her nucleus 5.8)   (4) letrozole started 08/31/2014;   (a) palbociclib added Sept 2016 at 75 mg 21/7, with significant neutropenia; not repeated after first cycle  (b) letrozole discontinued 05/29/2017 with evidence of disease progression in the breast   (5)  zolendronate started 09/20/2014, stopped after initial dose due to poor tolerance  (a) denosumab/ Xgeva started 01/31/2015, repeated every 4 weeks  (b) changed to every 8 weeks as of August 2018.    (6) on 10/20/2014 the patient underwent T2-T3 and T4 decompressive laminectomy with removal of epidural tumor, C7-T4 segmental pedicle screw instrumentation with virage screw system with arrow guidance protocol and C7-T4 posterolateral fusion. The cells were positive for the estrogen receptor. HER-2/neu testing by [facility] showed again equivocal results,  (a)  most recent bone scan 10/01/2016 finds no new lesions only postoperative changes  (7) radiation 11/23/2014-12/11/2014.  (a) T1-T5 was treated to 30 Gy in 12 fractions at 2.5 Gy per fraction   (8) initiated close follow-up while considering eventual left lumpectomy or mastectomy depending on  longer-term results of systemic therapy  (a) most recent left breast ultrasonography 10/30/2016 found this mass to measure 0.7 cm  (b) repeat left breast ultrasonography 05/13/2017 showed the upper outer quadrant mass to have grown to 1.4 cm  (c) left breast ultrasound 09/08/2017 shows the mass to now measure 0.9 cm  (d) left breast biopsy x2 on 12/11/2017 shows high-grade ductal carcinoma in situ, essentially estrogen receptor negative  (9) genetics testing using the Breast/Ovarian Cancer Panel through [name], MD) found no deleterious mutations in ATM, BARD1, BRCA1, BRCA2, BRIP1, CDH1, CHEK2, EPCAM, FANCC, MLH1, MSH2, MSH6, NBN, PALB2, PMS2, PTEN, RAD51C, RAD51D, STK11, TP53, or XRCC2    (10) right thyroid nodule is a complex cyst as noted on CT scan of the neck 01/29/2016  (11) osteoporosis: Bone density at [facility] 09/12/2016 showed a T score of -4.8.  (a) continue denosumab/Xgeva as above  (12) fulvestrant started 05/29/2017, last dose 07/22/2018 (discontinued due to pandemic).  (a) started palbociclib 06/29/2017 at 75 mg every other day for 21 days on, 7 days off  (  b) palbociclib discontinued on 3/29 due to progressive fatigue and patient preference  (13)  was to start abemaciclib 02/04/2018, but patient opted for going back to palbociclib at a lower dose beginning in November 19, patient subsequently declined s palbociclib, and  proceeded to surgery  (14) status post left lumpectomy 05/03/2018 showing a pT1b pNX invasive ductal carcinoma, grade 2, with equivocal HER-2 results  (a) opted against adjuvant radiation given presence of stage IV disease  (15) tamoxifen supposed to have been  started started 09/13/2018 as a "bridge" pending resumption of fulvestrant/denosumab, but never started by the patient  (16) PET scan 11/02/2018 showed no active disease  (a) cerianna scan on 10/31/2019 shows activity in 2 liver lesions, no active bone or lung lesions  (b) abdominal ultrasound 11/08/2019 confirms 2 liver lesions measuring 4.4 and 2.1 cm.  (c) biopsy of the liver lesions  (17) letrozole started 02/15/2019, discontinued 12/02/2019 with evidence of progression  (a) bone density 09/12/2016 showed a T score of -4.8.  (18) fulvestrant resumed 12/08/2019  (a) palbociclib added at 75 mg daily 21/7 starting 12/20/2019    PLAN: Florina actually took the palbociclib every other day.  That is all right to start with.  However I would like her to be at a higher dose especially since her counts are so good today.  She is going to complete this cycle on August 16, which means she will only have taken 11 tablets.  She will have 10 tablets left over and she will keep those in her fridge in case there is a delay in mailing and sometime in the future.  On 01/17/2020 she will start 75 mg daily and she will take that for 21 days.  Her calcium has been up even though her albumin is a little bit down.  We are going to add Xgeva every 3 months to try to bring this under better control.  She will receive her first dose on September 8 when she returns for her next treatment.  She will see [place] at that time.  Sometime in October or November which she will probably be restaged most likely with a CT of the abdomen and pelvis and a chest x-ray  She knows to call for any other issue that may develop before the next visit  Total encounter time 40 minutes.*   Trevonte Ashkar, [name], MD  01/04/20 1:12 PM Medical Oncology and Hematology [facility] [place], [place] [address] Tel. [phone]    Fax. [phone]   I, Wilburn Mylar, am acting as scribe for Dr. [name].  Paz Winsett.  I, Lurline Del MD, have reviewed the above documentation for accuracy and completeness, and I agree with the above.   *Total Encounter Time as defined by the Centers for Medicare and Medicaid Services includes, in addition to the face-to-face time of a patient visit (documented in the note above) non-face-to-face time: obtaining and reviewing outside history, ordering and reviewing medications, tests or procedures, care coordination (communications with other health care professionals or caregivers) and documentation in the medical record.

## 2020-01-04 ENCOUNTER — Telehealth: Payer: Self-pay | Admitting: Oncology

## 2020-01-04 ENCOUNTER — Other Ambulatory Visit: Payer: Self-pay

## 2020-01-04 ENCOUNTER — Inpatient Hospital Stay: Payer: Medicare Other | Attending: Oncology | Admitting: Oncology

## 2020-01-04 ENCOUNTER — Inpatient Hospital Stay: Payer: Medicare Other

## 2020-01-04 VITALS — BP 136/85 | HR 96 | Temp 98.3°F | Resp 16 | Ht 61.5 in | Wt 122.0 lb

## 2020-01-04 DIAGNOSIS — C50412 Malignant neoplasm of upper-outer quadrant of left female breast: Secondary | ICD-10-CM | POA: Diagnosis not present

## 2020-01-04 DIAGNOSIS — M810 Age-related osteoporosis without current pathological fracture: Secondary | ICD-10-CM

## 2020-01-04 DIAGNOSIS — Z5111 Encounter for antineoplastic chemotherapy: Secondary | ICD-10-CM | POA: Insufficient documentation

## 2020-01-04 DIAGNOSIS — Z170 Estrogen receptor positive status [ER+]: Secondary | ICD-10-CM | POA: Diagnosis not present

## 2020-01-04 DIAGNOSIS — C50919 Malignant neoplasm of unspecified site of unspecified female breast: Secondary | ICD-10-CM

## 2020-01-04 DIAGNOSIS — C7951 Secondary malignant neoplasm of bone: Secondary | ICD-10-CM | POA: Diagnosis not present

## 2020-01-04 DIAGNOSIS — Z87891 Personal history of nicotine dependence: Secondary | ICD-10-CM | POA: Diagnosis not present

## 2020-01-04 LAB — CBC WITH DIFFERENTIAL/PLATELET
Abs Immature Granulocytes: 0 10*3/uL (ref 0.00–0.07)
Basophils Absolute: 0.1 10*3/uL (ref 0.0–0.1)
Basophils Relative: 2 %
Eosinophils Absolute: 0.1 10*3/uL (ref 0.0–0.5)
Eosinophils Relative: 2 %
HCT: 42 % (ref 36.0–46.0)
Hemoglobin: 13.9 g/dL (ref 12.0–15.0)
Immature Granulocytes: 0 %
Lymphocytes Relative: 21 %
Lymphs Abs: 0.8 10*3/uL (ref 0.7–4.0)
MCH: 30.5 pg (ref 26.0–34.0)
MCHC: 33.1 g/dL (ref 30.0–36.0)
MCV: 92.1 fL (ref 80.0–100.0)
Monocytes Absolute: 0.3 10*3/uL (ref 0.1–1.0)
Monocytes Relative: 7 %
Neutro Abs: 2.6 10*3/uL (ref 1.7–7.7)
Neutrophils Relative %: 68 %
Platelets: 206 10*3/uL (ref 150–400)
RBC: 4.56 MIL/uL (ref 3.87–5.11)
RDW: 14 % (ref 11.5–15.5)
WBC: 3.8 10*3/uL — ABNORMAL LOW (ref 4.0–10.5)
nRBC: 0 % (ref 0.0–0.2)

## 2020-01-04 LAB — COMPREHENSIVE METABOLIC PANEL
ALT: 19 U/L (ref 0–44)
AST: 22 U/L (ref 15–41)
Albumin: 3.4 g/dL — ABNORMAL LOW (ref 3.5–5.0)
Alkaline Phosphatase: 90 U/L (ref 38–126)
Anion gap: 8 (ref 5–15)
BUN: 13 mg/dL (ref 8–23)
CO2: 26 mmol/L (ref 22–32)
Calcium: 10.7 mg/dL — ABNORMAL HIGH (ref 8.9–10.3)
Chloride: 105 mmol/L (ref 98–111)
Creatinine, Ser: 0.75 mg/dL (ref 0.44–1.00)
GFR calc Af Amer: >60 mL/min (ref >60)
GFR calc non Af Amer: >60 mL/min (ref >60)
Glucose, Bld: 99 mg/dL (ref 70–99)
Potassium: 4 mmol/L (ref 3.5–5.1)
Sodium: 139 mmol/L (ref 135–145)
Total Bilirubin: 0.5 mg/dL (ref 0.3–1.2)
Total Protein: 7 g/dL (ref 6.5–8.1)

## 2020-01-04 MED ORDER — FULVESTRANT 250 MG/5ML IM SOLN
500.0000 mg | Freq: Once | INTRAMUSCULAR | Status: AC
  Administered 2020-01-04: 500 mg via INTRAMUSCULAR

## 2020-01-04 MED ORDER — PALBOCICLIB 75 MG PO TABS: 75.0000 mg | ORAL_TABLET | Freq: Every day | ORAL | 6 refills | Status: DC

## 2020-01-04 MED ORDER — FULVESTRANT 250 MG/5ML IM SOLN
INTRAMUSCULAR | Status: AC
  Filled 2020-01-04: qty 10

## 2020-01-04 NOTE — Patient Instructions (Signed)
Fulvestrant injection What is this medicine? FULVESTRANT (ful VES trant) blocks the effects of estrogen. It is used to treat breast cancer. This medicine may be used for other purposes; ask your health care provider or pharmacist if you have questions. COMMON BRAND NAME(S): FASLODEX What should I tell my health care provider before I take this medicine? They need to know if you have any of these conditions:  bleeding disorders  liver disease  low blood counts, like low white cell, platelet, or red cell counts  an unusual or allergic reaction to fulvestrant, other medicines, foods, dyes, or preservatives  pregnant or trying to get pregnant  breast-feeding How should I use this medicine? This medicine is for injection into a muscle. It is usually given by a health care professional in a hospital or clinic setting. Talk to your pediatrician regarding the use of this medicine in children. Special care may be needed. Overdosage: If you think you have taken too much of this medicine contact a poison control center or emergency room at once. NOTE: This medicine is only for you. Do not share this medicine with others. What if I miss a dose? It is important not to miss your dose. Call your doctor or health care professional if you are unable to keep an appointment. What may interact with this medicine?  medicines that treat or prevent blood clots like warfarin, enoxaparin, dalteparin, apixaban, dabigatran, and rivaroxaban This list may not describe all possible interactions. Give your health care provider a list of all the medicines, herbs, non-prescription drugs, or dietary supplements you use. Also tell them if you smoke, drink alcohol, or use illegal drugs. Some items may interact with your medicine. What should I watch for while using this medicine? Your condition will be monitored carefully while you are receiving this medicine. You will need important blood work done while you are taking  this medicine. Do not become pregnant while taking this medicine or for at least 1 year after stopping it. Women of child-bearing potential will need to have a negative pregnancy test before starting this medicine. Women should inform their doctor if they wish to become pregnant or think they might be pregnant. There is a potential for serious side effects to an unborn child. Men should inform their doctors if they wish to father a child. This medicine may lower sperm counts. Talk to your health care professional or pharmacist for more information. Do not breast-feed an infant while taking this medicine or for 1 year after the last dose. What side effects may I notice from receiving this medicine? Side effects that you should report to your doctor or health care professional as soon as possible:  allergic reactions like skin rash, itching or hives, swelling of the face, lips, or tongue  feeling faint or lightheaded, falls  pain, tingling, numbness, or weakness in the legs  signs and symptoms of infection like fever or chills; cough; flu-like symptoms; sore throat  vaginal bleeding Side effects that usually do not require medical attention (report to your doctor or health care professional if they continue or are bothersome):  aches, pains  constipation  diarrhea  headache  hot flashes  nausea, vomiting  pain at site where injected  stomach pain This list may not describe all possible side effects. Call your doctor for medical advice about side effects. You may report side effects to FDA at 1-800-FDA-1088. Where should I keep my medicine? This drug is given in a hospital or clinic and will   not be stored at home. NOTE: This sheet is a summary. It may not cover all possible information. If you have questions about this medicine, talk to your doctor, pharmacist, or health care provider.  2020 Elsevier/Gold Standard (2017-08-20 11:34:41)  

## 2020-01-04 NOTE — Telephone Encounter (Signed)
Scheduled appts per 8/11 los. Gave pt a print out of AVS.  

## 2020-01-11 MED FILL — IBRANCE 75 MG TABS: 75 | 28 days supply | Qty: 21 | Fill #1

## 2020-01-31 NOTE — Progress Notes (Signed)
April Rosales   [phone] [phone]   Clinic Follow up Note   Patient Care Team: [name], MD as PCP - General (Internal Medicine) Magrinat, [name], MD as Consulting Physician (Oncology) [name], MD as Consulting Physician (Orthopedic Surgery) [name], MD as Consulting Physician (General Surgery) 02/01/2020  CHIEF COMPLAINT: Follow-up stage IV ER positive breast cancer  CURRENT THERAPY: Fulvestrant, palbociclib, and Xgeva (restarting 02/01/2020)  INTERVAL HISTORY: April Rosales returns for follow-up as scheduled.  She began cycle 2 Ibrance on 8/24.  She feels well.  Her appetite is slightly decreased.  Energy is adequate still "able to do."  She wears a belt for chronic back pain, denies new pain.  She has an occasional dry cough at baseline.  No fever, chills, chest pain, dyspnea, nausea, vomiting, constipation, diarrhea, bleeding, rash, mucositis, or other concerns.    MEDICAL HISTORY:  Past Medical History:  Diagnosis Date  . Arthritis   . Breast cancer [facility]) 1985/2016   takes [name] daily  . Chronic back pain    stenosis/listhesis  . Family history of ovarian cancer   . Osteoporosis    takes Vit D  . Radiation 11/23/14-12/11/14   30 Gy T1-T5     SURGICAL HISTORY: Past Surgical History:  Procedure Laterality Date  . ABDOMINAL HYSTERECTOMY     1980  . APPENDECTOMY  1977  . APPLICATION OF INTRAOPERATIVE CT SCAN N/A 10/20/2014   Procedure: APPLICATION OF INTRAOPERATIVE CAT SCAN;  Surgeon: [name], MD;  Location: [place] [facility];  Service: Neurosurgery;  Laterality: N/A;  . BREAST LUMPECTOMY WITH RADIOACTIVE SEED LOCALIZATION Left 05/03/2018   Procedure: LEFT BREAST LUMPECTOMY WITH BRACKETED RADIOACTIVE SEED LOCALIZATION;  Surgeon: [name], MD;  Location: [place];  Service: General;  Laterality: Left;  . BREAST SURGERY Right 1985  . THYROID CYST EXCISION  1967    I have reviewed the social  history and family history with the patient and they are unchanged from previous note.  ALLERGIES:  is allergic to ativan [lorazepam], dilaudid [hydromorphone hcl], and haldol [haloperidol lactate].  MEDICATIONS:  Current Outpatient Medications  Medication Sig Dispense Refill  . Ascorbic Acid (VITAMIN C PO) Take 1 tablet by mouth every other day. Takes qod    . aspirin EC 325 MG tablet Take 1 tablet (325 mg total) by mouth daily. 30 tablet 0  . Cholecalciferol (VITAMIN D3 ADULT GUMMIES PO) Take 2,000 Units by mouth daily.     . Cyanocobalamin (VITAMIN B-12 PO) Take by mouth daily. Takes every other day    . Iron-Vitamins (GERITOL PO) Take by mouth daily.    . palbociclib (IBRANCE) 75 MG tablet Take 1 tablet (75 mg total) by mouth daily. Take for 21 days on, 7 days off, repeat every 28 days. Start 01/04/2020 21 tablet 6   No current facility-administered medications for this visit.    PHYSICAL EXAMINATION:  Vitals:   02/01/20 1443  BP: 136/88  Pulse: (!) 103  Resp: 18  Temp: 99.1 F (37.3 C)  SpO2: 97%   Filed Weights   02/01/20 1443  Weight: 122 lb 1.6 oz (55.4 kg)    GENERAL:alert, no distress and comfortable SKIN: No rash to exposed skin EYES: sclera clear LUNGS: clear with normal breathing effort HEART: regular rate & rhythm, no lower extremity edema NEURO: alert & oriented x 3 with fluent speech, normal gait Breast exam deferred  LABORATORY DATA:  I have reviewed the data as listed CBC Latest  Ref Rng & Units 02/01/2020 01/04/2020 12/12/2019  WBC 4.0 - 10.5 K/uL 2.9(L) 3.8(L) 5.0  Hemoglobin 12.0 - 15.0 g/dL 13.6 13.9 15.4(H)  Hematocrit 36 - 46 % 40.4 42.0 45.6  Platelets 150 - 400 K/uL 201 206 259     CMP Latest Ref Rng & Units 02/01/2020 01/04/2020 12/12/2019  Glucose 70 - 99 mg/dL 113(H) 99 110(H)  BUN 8 - 23 mg/dL '15 13 14  ' Creatinine 0.44 - 1.00 mg/dL 0.76 0.75 0.57  Sodium 135 - 145 mmol/L 140 139 141  Potassium 3.5 - 5.1 mmol/L 4.2 4.0 4.0  Chloride 98 -  111 mmol/L 105 105 105  CO2 22 - 32 mmol/L '28 26 24  ' Calcium 8.9 - 10.3 mg/dL 10.1 10.7(H) 10.7(H)  Total Protein 6.5 - 8.1 g/dL 7.0 7.0 7.6  Total Bilirubin 0.3 - 1.2 mg/dL 0.3 0.5 0.5  Alkaline Phos 38 - 126 U/L 68 90 89  AST 15 - 41 U/L 22 22 41  ALT 0 - 44 U/L 15 19 47(H)      RADIOGRAPHIC STUDIES: I have personally reviewed the radiological images as listed and agreed with the findings in the report. No results found.   ASSESSMENT & PLAN:  79 y.o. female   1.  Right breast cancer -Diagnosed 1985, s/p right lumpectomy with axillary node dissection and adjuvant radiation at [facility] -She developed metastatic disease in 07/2014 in the spine, lung, and left breast including pathologic fracture at T3 with epidural tumor displacing the cord to the right, but no cord compression.  -Normal CA 27-29 tumor marker in 08/2014 -Mammo-/US 08/29/2014 of the upper inner left breast and biopsy revealed an invasive breast cancer with both lobular and ductal features, estrogen receptor positive, progesterone receptor weakly positive, with an MIB-1 in the 40% range, HER-2 equivocal (6 else ratio 1.5, but average number her nucleus 5.8) -S/p Zometa x1 08/2014 stopped after initial dose due to poor tolerance, changed to Xgeva in 01/2015 every 4-8 weeks -She began letrozole 08/2014 and added palbociclib in 01/2015 which was stopped after 1 cycle due to neutropenia.  She eventually discontinued letrozole in 05/2017 with disease progression -S/p T2-T3 and T4 decompressive laminectomy, C7--T4 screw instrumentation and C7-T4 fusion.  Cells ER positive, HER-2 equivocal -S/p palliative radiation to T1-T5 from 11/23/2014-12/11/2014 -left breast biopsy x2 on 12/11/2017 shows high-grade ductal carcinoma in situ, essentially estrogen receptor negative -Genetic testing using the Breast/Ovarian Cancer Panel through [name], MD) found no deleterious mutations  -Fulvestrant starting 05/29/2017-07/22/2018 discontinued due  to the COVID-19 pandemic -Started palbociclib 06/29/2017 but discontinued on 08/21/2017 due to progressive fatigue and patient preference.  Pending start of abemaciclib 01/2018 but patient preferred palbociclib at a lower dose beginning 03/2018 which ultimately she did not start and proceeded with left lumpectomy -S/p left lumpectomy 05/03/2018 showing a pT1b pNX invasive ductal carcinoma, grade 2, with equivocal HER-2 results.  Opted against adjuvant radiation in the setting of stage IV disease -Adjuvant tamoxifen was recommended but patient declined -PET scan 11/02/2018 showed no active disease. [place] scan on 10/31/2019 shows activity in 2 liver lesions, no active bone or lung lesions.   -She completed letrozole from 02/15/2019-12/02/2019 until disease progression -Liver biopsy on 12/12/2019 shows metastatic mammary carcinoma, ER/PR positive, HER-2 equivocal, Ki-67 10%, HER2 positive by FISH.  -Fulvestrant resumed 12/08/2019  2. right thyroid nodule is a complex cyst as noted on CT scan of the neck 01/29/2016  3. osteoporosis: Bone density at [facility] 09/12/2016 showed a T score of -4.8  Disposition: April Rosales is clinically doing well.  She continues monthly fulvestrant and currently C2D16 of palbociclib.  She is tolerating treatment very well overall without significant toxicities.  We reviewed the CBC and CMP from today.  She has mild neutropenia, ANC 1.6. We reviewed neutropenic precautions. I recommend for her to complete a total of 21 days of Ibrance which will be 9/13. She will have CBC on 9/17 to determine the start date of her next cycle, we will call her.   She will proceed with fulvestrant today as planned. She prefers to defer Xgeva until her next appointment, which is reasonable given her normal Ca today.   F/u in 4 weeks with next injection.   All questions were answered. The patient knows to call the clinic with any problems, questions or concerns. No barriers to learning was  detected. Total encounter time was 30 minutes.     Alla Feeling, NP 02/01/20

## 2020-02-01 ENCOUNTER — Inpatient Hospital Stay: Payer: Medicare Other | Attending: Oncology

## 2020-02-01 ENCOUNTER — Ambulatory Visit: Payer: Medicare Other | Admitting: Adult Health

## 2020-02-01 ENCOUNTER — Other Ambulatory Visit: Payer: Medicare Other

## 2020-02-01 ENCOUNTER — Inpatient Hospital Stay: Payer: Medicare Other

## 2020-02-01 ENCOUNTER — Encounter: Payer: Self-pay | Admitting: Nurse Practitioner

## 2020-02-01 ENCOUNTER — Inpatient Hospital Stay (HOSPITAL_BASED_OUTPATIENT_CLINIC_OR_DEPARTMENT_OTHER): Payer: Medicare Other | Admitting: Nurse Practitioner

## 2020-02-01 ENCOUNTER — Other Ambulatory Visit: Payer: Self-pay

## 2020-02-01 VITALS — BP 136/88 | HR 103 | Temp 99.1°F | Resp 18 | Ht 61.5 in | Wt 122.1 lb

## 2020-02-01 DIAGNOSIS — M810 Age-related osteoporosis without current pathological fracture: Secondary | ICD-10-CM

## 2020-02-01 DIAGNOSIS — Z5111 Encounter for antineoplastic chemotherapy: Secondary | ICD-10-CM | POA: Insufficient documentation

## 2020-02-01 DIAGNOSIS — C7951 Secondary malignant neoplasm of bone: Secondary | ICD-10-CM | POA: Insufficient documentation

## 2020-02-01 DIAGNOSIS — C50412 Malignant neoplasm of upper-outer quadrant of left female breast: Secondary | ICD-10-CM

## 2020-02-01 DIAGNOSIS — C50919 Malignant neoplasm of unspecified site of unspecified female breast: Secondary | ICD-10-CM

## 2020-02-01 DIAGNOSIS — C50911 Malignant neoplasm of unspecified site of right female breast: Secondary | ICD-10-CM

## 2020-02-01 LAB — CBC WITH DIFFERENTIAL/PLATELET
Abs Immature Granulocytes: 0.01 10*3/uL (ref 0.00–0.07)
Basophils Absolute: 0.1 10*3/uL (ref 0.0–0.1)
Basophils Relative: 3 %
Eosinophils Absolute: 0.1 10*3/uL (ref 0.0–0.5)
Eosinophils Relative: 3 %
HCT: 40.4 % (ref 36.0–46.0)
Hemoglobin: 13.6 g/dL (ref 12.0–15.0)
Immature Granulocytes: 0 %
Lymphocytes Relative: 34 %
Lymphs Abs: 1 10*3/uL (ref 0.7–4.0)
MCH: 31.7 pg (ref 26.0–34.0)
MCHC: 33.7 g/dL (ref 30.0–36.0)
MCV: 94.2 fL (ref 80.0–100.0)
Monocytes Absolute: 0.2 10*3/uL (ref 0.1–1.0)
Monocytes Relative: 6 %
Neutro Abs: 1.6 10*3/uL — ABNORMAL LOW (ref 1.7–7.7)
Neutrophils Relative %: 54 %
Platelets: 201 10*3/uL (ref 150–400)
RBC: 4.29 MIL/uL (ref 3.87–5.11)
RDW: 15.4 % (ref 11.5–15.5)
WBC: 2.9 10*3/uL — ABNORMAL LOW (ref 4.0–10.5)
nRBC: 0 % (ref 0.0–0.2)

## 2020-02-01 LAB — COMPREHENSIVE METABOLIC PANEL
ALT: 15 U/L (ref 0–44)
AST: 22 U/L (ref 15–41)
Albumin: 3.5 g/dL (ref 3.5–5.0)
Alkaline Phosphatase: 68 U/L (ref 38–126)
Anion gap: 7 (ref 5–15)
BUN: 15 mg/dL (ref 8–23)
CO2: 28 mmol/L (ref 22–32)
Calcium: 10.1 mg/dL (ref 8.9–10.3)
Chloride: 105 mmol/L (ref 98–111)
Creatinine, Ser: 0.76 mg/dL (ref 0.44–1.00)
GFR calc Af Amer: >60 mL/min (ref >60)
GFR calc non Af Amer: >60 mL/min (ref >60)
Glucose, Bld: 113 mg/dL — ABNORMAL HIGH (ref 70–99)
Potassium: 4.2 mmol/L (ref 3.5–5.1)
Sodium: 140 mmol/L (ref 135–145)
Total Bilirubin: 0.3 mg/dL (ref 0.3–1.2)
Total Protein: 7 g/dL (ref 6.5–8.1)

## 2020-02-01 MED ORDER — FULVESTRANT 250 MG/5ML IM SOLN
INTRAMUSCULAR | Status: AC
  Filled 2020-02-01: qty 10

## 2020-02-01 MED ORDER — FULVESTRANT 250 MG/5ML IM SOLN
500.0000 mg | Freq: Once | INTRAMUSCULAR | Status: AC
  Administered 2020-02-01: 500 mg via INTRAMUSCULAR

## 2020-02-01 NOTE — Patient Instructions (Signed)
Fulvestrant injection What is this medicine? FULVESTRANT (ful VES trant) blocks the effects of estrogen. It is used to treat breast cancer. This medicine may be used for other purposes; ask your health care provider or pharmacist if you have questions. COMMON BRAND NAME(S): FASLODEX What should I tell my health care provider before I take this medicine? They need to know if you have any of these conditions:  bleeding disorders  liver disease  low blood counts, like low white cell, platelet, or red cell counts  an unusual or allergic reaction to fulvestrant, other medicines, foods, dyes, or preservatives  pregnant or trying to get pregnant  breast-feeding How should I use this medicine? This medicine is for injection into a muscle. It is usually given by a health care professional in a hospital or clinic setting. Talk to your pediatrician regarding the use of this medicine in children. Special care may be needed. Overdosage: If you think you have taken too much of this medicine contact a poison control center or emergency room at once. NOTE: This medicine is only for you. Do not share this medicine with others. What if I miss a dose? It is important not to miss your dose. Call your doctor or health care professional if you are unable to keep an appointment. What may interact with this medicine?  medicines that treat or prevent blood clots like warfarin, enoxaparin, dalteparin, apixaban, dabigatran, and rivaroxaban This list may not describe all possible interactions. Give your health care provider a list of all the medicines, herbs, non-prescription drugs, or dietary supplements you use. Also tell them if you smoke, drink alcohol, or use illegal drugs. Some items may interact with your medicine. What should I watch for while using this medicine? Your condition will be monitored carefully while you are receiving this medicine. You will need important blood work done while you are taking  this medicine. Do not become pregnant while taking this medicine or for at least 1 year after stopping it. Women of child-bearing potential will need to have a negative pregnancy test before starting this medicine. Women should inform their doctor if they wish to become pregnant or think they might be pregnant. There is a potential for serious side effects to an unborn child. Men should inform their doctors if they wish to father a child. This medicine may lower sperm counts. Talk to your health care professional or pharmacist for more information. Do not breast-feed an infant while taking this medicine or for 1 year after the last dose. What side effects may I notice from receiving this medicine? Side effects that you should report to your doctor or health care professional as soon as possible:  allergic reactions like skin rash, itching or hives, swelling of the face, lips, or tongue  feeling faint or lightheaded, falls  pain, tingling, numbness, or weakness in the legs  signs and symptoms of infection like fever or chills; cough; flu-like symptoms; sore throat  vaginal bleeding Side effects that usually do not require medical attention (report to your doctor or health care professional if they continue or are bothersome):  aches, pains  constipation  diarrhea  headache  hot flashes  nausea, vomiting  pain at site where injected  stomach pain This list may not describe all possible side effects. Call your doctor for medical advice about side effects. You may report side effects to FDA at 1-800-FDA-1088. Where should I keep my medicine? This drug is given in a hospital or clinic and will   not be stored at home. NOTE: This sheet is a summary. It may not cover all possible information. If you have questions about this medicine, talk to your doctor, pharmacist, or health care provider.  2020 Elsevier/Gold Standard (2017-08-20 11:34:41)  

## 2020-02-09 ENCOUNTER — Other Ambulatory Visit: Payer: Self-pay | Admitting: Oncology

## 2020-02-09 NOTE — Progress Notes (Signed)
Note HER2 positivity in recent liver Bx

## 2020-02-10 ENCOUNTER — Inpatient Hospital Stay: Payer: Medicare Other

## 2020-02-10 ENCOUNTER — Other Ambulatory Visit: Payer: Self-pay

## 2020-02-10 DIAGNOSIS — C50412 Malignant neoplasm of upper-outer quadrant of left female breast: Secondary | ICD-10-CM

## 2020-02-10 DIAGNOSIS — C50919 Malignant neoplasm of unspecified site of unspecified female breast: Secondary | ICD-10-CM

## 2020-02-10 DIAGNOSIS — Z5111 Encounter for antineoplastic chemotherapy: Secondary | ICD-10-CM | POA: Diagnosis not present

## 2020-02-10 DIAGNOSIS — Z170 Estrogen receptor positive status [ER+]: Secondary | ICD-10-CM

## 2020-02-10 DIAGNOSIS — C7951 Secondary malignant neoplasm of bone: Secondary | ICD-10-CM | POA: Diagnosis not present

## 2020-02-10 LAB — COMPREHENSIVE METABOLIC PANEL
ALT: 16 U/L (ref 0–44)
AST: 21 U/L (ref 15–41)
Albumin: 3.7 g/dL (ref 3.5–5.0)
Alkaline Phosphatase: 59 U/L (ref 38–126)
Anion gap: 5 (ref 5–15)
BUN: 12 mg/dL (ref 8–23)
CO2: 30 mmol/L (ref 22–32)
Calcium: 10.3 mg/dL (ref 8.9–10.3)
Chloride: 105 mmol/L (ref 98–111)
Creatinine, Ser: 0.74 mg/dL (ref 0.44–1.00)
GFR calc Af Amer: >60 mL/min (ref >60)
GFR calc non Af Amer: >60 mL/min (ref >60)
Glucose, Bld: 87 mg/dL (ref 70–99)
Potassium: 4.5 mmol/L (ref 3.5–5.1)
Sodium: 140 mmol/L (ref 135–145)
Total Bilirubin: 0.4 mg/dL (ref 0.3–1.2)
Total Protein: 7.2 g/dL (ref 6.5–8.1)

## 2020-02-10 LAB — CBC WITH DIFFERENTIAL/PLATELET
Abs Immature Granulocytes: 0 10*3/uL (ref 0.00–0.07)
Basophils Absolute: 0 10*3/uL (ref 0.0–0.1)
Basophils Relative: 2 %
Eosinophils Absolute: 0.1 10*3/uL (ref 0.0–0.5)
Eosinophils Relative: 2 %
HCT: 40.4 % (ref 36.0–46.0)
Hemoglobin: 13.7 g/dL (ref 12.0–15.0)
Immature Granulocytes: 0 %
Lymphocytes Relative: 32 %
Lymphs Abs: 0.8 10*3/uL (ref 0.7–4.0)
MCH: 32 pg (ref 26.0–34.0)
MCHC: 33.9 g/dL (ref 30.0–36.0)
MCV: 94.4 fL (ref 80.0–100.0)
Monocytes Absolute: 0.2 10*3/uL (ref 0.1–1.0)
Monocytes Relative: 9 %
Neutro Abs: 1.4 10*3/uL — ABNORMAL LOW (ref 1.7–7.7)
Neutrophils Relative %: 55 %
Platelets: 156 10*3/uL (ref 150–400)
RBC: 4.28 MIL/uL (ref 3.87–5.11)
RDW: 15.8 % — ABNORMAL HIGH (ref 11.5–15.5)
WBC: 2.5 10*3/uL — ABNORMAL LOW (ref 4.0–10.5)
nRBC: 0 % (ref 0.0–0.2)

## 2020-02-14 ENCOUNTER — Telehealth: Payer: Self-pay

## 2020-02-14 MED FILL — IBRANCE 75 MG TABS: 75 | 28 days supply | Qty: 21 | Fill #2

## 2020-02-14 NOTE — Telephone Encounter (Signed)
Called pt per April Rosales to inform pt she needed to hold Ibrance for one week d/t ANC 1.4. Pt denies fever/chills, states she "feels fine." Pt understands she should start cycle 3 of Ibrance 02/20/20 and will f/u with Dr Jana Hakim 10/7.

## 2020-02-16 ENCOUNTER — Telehealth: Payer: Self-pay | Admitting: *Deleted

## 2020-02-16 NOTE — Telephone Encounter (Signed)
This RN spoke with pt this am per her call stating the rash on her left shin is not improving and now she is beginning to have it on her other leg and a spot on her face.  " and the [facility] says it can cause a rash "  Note pt was to restart but was advised to hold until 9/27 due to [place] of 1.4 with lab check 9/17.  On 9/17 when pt came in for lab she showed this RN the rash on her left shin- which had small raised bumps in an area about the diameter of a tennis ball. She did not have any outbreak else where and was using alcohol on the area.  This RN advised at that time to use hydrocortisone and neosporin to site.  Presently Timiko states she quit using the hydrocortisone " it didn't seem to help much but the neosporin helps with it not itching "  Per above - pt will come in on 9/24 for visit and review by [facility]/NP.

## 2020-02-17 ENCOUNTER — Inpatient Hospital Stay (HOSPITAL_BASED_OUTPATIENT_CLINIC_OR_DEPARTMENT_OTHER): Payer: Medicare Other | Admitting: Adult Health

## 2020-02-17 ENCOUNTER — Other Ambulatory Visit: Payer: Self-pay

## 2020-02-17 ENCOUNTER — Encounter: Payer: Self-pay | Admitting: Adult Health

## 2020-02-17 ENCOUNTER — Inpatient Hospital Stay: Payer: Medicare Other

## 2020-02-17 VITALS — BP 136/87 | HR 95 | Temp 97.1°F | Resp 18 | Ht 61.5 in | Wt 121.9 lb

## 2020-02-17 DIAGNOSIS — C50919 Malignant neoplasm of unspecified site of unspecified female breast: Secondary | ICD-10-CM

## 2020-02-17 DIAGNOSIS — C50412 Malignant neoplasm of upper-outer quadrant of left female breast: Secondary | ICD-10-CM

## 2020-02-17 DIAGNOSIS — Z5111 Encounter for antineoplastic chemotherapy: Secondary | ICD-10-CM | POA: Diagnosis not present

## 2020-02-17 DIAGNOSIS — C7951 Secondary malignant neoplasm of bone: Secondary | ICD-10-CM

## 2020-02-17 DIAGNOSIS — Z170 Estrogen receptor positive status [ER+]: Secondary | ICD-10-CM | POA: Diagnosis not present

## 2020-02-17 LAB — COMPREHENSIVE METABOLIC PANEL
ALT: 18 U/L (ref 0–44)
AST: 23 U/L (ref 15–41)
Albumin: 3.5 g/dL (ref 3.5–5.0)
Alkaline Phosphatase: 56 U/L (ref 38–126)
Anion gap: 5 (ref 5–15)
BUN: 10 mg/dL (ref 8–23)
CO2: 31 mmol/L (ref 22–32)
Calcium: 10.2 mg/dL (ref 8.9–10.3)
Chloride: 105 mmol/L (ref 98–111)
Creatinine, Ser: 0.69 mg/dL (ref 0.44–1.00)
GFR calc Af Amer: >60 mL/min (ref >60)
GFR calc non Af Amer: >60 mL/min (ref >60)
Glucose, Bld: 90 mg/dL (ref 70–99)
Potassium: 4.8 mmol/L (ref 3.5–5.1)
Sodium: 141 mmol/L (ref 135–145)
Total Bilirubin: 0.3 mg/dL (ref 0.3–1.2)
Total Protein: 7 g/dL (ref 6.5–8.1)

## 2020-02-17 LAB — CBC WITH DIFFERENTIAL/PLATELET
Abs Immature Granulocytes: 0.01 10*3/uL (ref 0.00–0.07)
Basophils Absolute: 0.1 10*3/uL (ref 0.0–0.1)
Basophils Relative: 4 %
Eosinophils Absolute: 0.1 10*3/uL (ref 0.0–0.5)
Eosinophils Relative: 4 %
HCT: 41 % (ref 36.0–46.0)
Hemoglobin: 13.9 g/dL (ref 12.0–15.0)
Immature Granulocytes: 0 %
Lymphocytes Relative: 28 %
Lymphs Abs: 0.7 10*3/uL (ref 0.7–4.0)
MCH: 32.2 pg (ref 26.0–34.0)
MCHC: 33.9 g/dL (ref 30.0–36.0)
MCV: 94.9 fL (ref 80.0–100.0)
Monocytes Absolute: 0.6 10*3/uL (ref 0.1–1.0)
Monocytes Relative: 21 %
Neutro Abs: 1.2 10*3/uL — ABNORMAL LOW (ref 1.7–7.7)
Neutrophils Relative %: 43 %
Platelets: 260 10*3/uL (ref 150–400)
RBC: 4.32 MIL/uL (ref 3.87–5.11)
RDW: 16.9 % — ABNORMAL HIGH (ref 11.5–15.5)
WBC: 2.7 10*3/uL — ABNORMAL LOW (ref 4.0–10.5)
nRBC: 0 % (ref 0.0–0.2)

## 2020-02-17 MED ORDER — PREDNISONE 10 MG (21) PO TBPK: ORAL_TABLET | ORAL | 0 refills | Status: DC

## 2020-02-17 MED ORDER — TRIAMCINOLONE ACETONIDE 0.5 % EX OINT: 1.0000 "application " | TOPICAL_OINTMENT | Freq: Two times a day (BID) | CUTANEOUS | 0 refills | Status: DC

## 2020-02-17 NOTE — Progress Notes (Signed)
April Rosales  [phone] [phone]    ID: April Rosales DOB: August 30, 1940  MR#: [id number]  YKD#:[id number]  Patient Care Team: April Pinto, MD as PCP - General (Internal Medicine) Rosales, April Dad, MD as Consulting Physician (Oncology) April Killings, MD as Consulting Physician (Orthopedic Surgery) April Skates, MD as Consulting Physician (General Surgery) OTHER: April Rosales, DDS   CHIEF COMPLAINT: Stage IV estrogen receptor positive breast cancer  CURRENT TREATMENT: Fulvestrant, palbociclib; Delton See   INTERVAL HISTORY: April Rosales is here today for f/u of her metastatic breast cancer on treatment with Fulvestrant every 4 weeks, Palbociclib 58m daily 3 weeks on and 1 week off, and Xgeva every 12 weeks.    CAzrielis tolerating her treatments well for the most part.  She is concerned about a rash that started around 02/06/2020, which was her final day of her three week cycle of Palbociclib.  She notes it started on her left lower leg, and wrapped around her calf posteriorly.  It was itching.  She has noted additional areas on her bilateral lower extremities, and one spot on her face, and one spot on her neck.  She says that these are itching.  She does not go outside.  She does not do yard work.  She has not changed detergent, lotion, food, or anything else.  She was recommended to apply neosporin, and that has helped somewhat, however these areas have remained persistent and she is concerned.    REVIEW OF SYSTEMS:  April Rosales had some occasional dizziness.  She says she turned her head quickly and she had a spinning sensation that was very brief but then resolves after a few seconds.  She has not had any allergies, focal weakness, vision changes.      She has no fevers, chills, chest pain, cough, shortness of breath, bowel/bladder changes, headaches, or vision issues.  A detailed ROS was otherwise non contributory.     BREAST CANCER  HISTORY: From the original intake note:   [facility] sounds like a stage I breast cancer. She tells me she had more than 30 lymph nodes removed from her right axilla and all of them were clear. She received  adjuvant radiation but no systemic treatment.  The patient had recently refused mammography with the last mammogram I can find dating back to August 2010.  More recently the patient presented with left scapular pain radiating down the left arm.  She was evaluated by Dr. YLorin Rosales who obtained a chest x-ray showing a possible abnormality at T3. He then set up the patient for an MRI of the thoracic spine performed 08/16/2014.  This showed multiple compression fractures  (a similar picture had been noted on lumbar MRI 10/09/2011 ). However at T3 they noted tumor in the vertebral body extending into the left pedicle and into the lateral epidural space, displacing the cord to the right. There was no evidence of cord compression or cord signal abnormality. There were no other areas of tumor identified in the thoracic spine.         The patient was then referred to Dr. KHal Neerwho on 08/24/2014 set April Rosales for CT scans of the chest, abdomen and pelvis. There was a dense mass in the upper inner quadrant of the left breast measuring 1.6 cm. There was a 1.2 cm nodule in the minor fissure of the right lung and some evidence of right lung fibrosis at  the site of the prior radiation port.  There was also a thyroid mass measuring 2 cm. However there were no parenchymal lung or liver lesions. Incidental meningoceles were noted as well as sclerosis of the fifth and sixth ribs which were felt to be likely posttraumatic.   On 08/29/2014 the patient underwent bilateral diagnostic mammography with tomosynthesis and left breast ultrasonography.  There were postsurgical changes in the upper right breast. In the left breast there was an irregular mass  measuring 2.3 cm in the upper inner quadrant. This was palpable. Ultrasound showed this to be hypoechoic and to measure 2.0 cm. There were adjacent areas of nodularity. There was no definite lymphadenopathy in the left axilla.  Biopsy of this breast mass 08/29/2014 showed an invasive adenocarcinoma with both ductal and lobular features (there was strong diffuse E-cadherin expression as well as areas with total absence of E-cadherin expression), with the preliminary prognostic profile showing strong estrogen positivity, very weak to near absent progesterone positivity, an MIB-1 of approximately 40%, and HER-2 equivocal  The patient's subsequent history is as detailed below.   PAST MEDICAL HISTORY: Past Medical History:  Diagnosis Date  . Arthritis   . Breast cancer [facility]) 1985/2016   takes [name] daily  . Chronic back pain    stenosis/listhesis  . Family history of ovarian cancer   . Osteoporosis    takes Vit D  . Radiation 11/23/14-12/11/14   30 Gy T1-T5     PAST SURGICAL HISTORY: Past Surgical History:  Procedure Laterality Date  . ABDOMINAL HYSTERECTOMY     1980  . APPENDECTOMY  1977  . APPLICATION OF INTRAOPERATIVE CT SCAN N/A 10/20/2014   Procedure: APPLICATION OF INTRAOPERATIVE CAT SCAN;  Surgeon: [name], MD;  Location: [place] [facility];  Service: Neurosurgery;  Laterality: N/A;  . BREAST LUMPECTOMY WITH RADIOACTIVE SEED LOCALIZATION Left 05/03/2018   Procedure: LEFT BREAST LUMPECTOMY WITH BRACKETED RADIOACTIVE SEED LOCALIZATION;  Surgeon: April Skates, MD;  Location: [place];  Service: General;  Laterality: Left;  . BREAST SURGERY Right 1985  . THYROID CYST EXCISION  1967    FAMILY HISTORY Family History  Problem Relation Age of Onset  . Heart disease Mother   . Hypertension Mother   . Heart disease Father   . Diabetes Father   . Breast cancer Paternal Aunt        4 paternal aunts with breast cancer over 30  . Prostate cancer Paternal Uncle   .  Stroke Paternal Grandfather   . Ovarian cancer Paternal Aunt   . Huntington's disease Other        Nephew, inherited from his father   The patient's father died from a heart attack at the age of 7. He had 9 sisters. 3 of those sisters had breast cancer, all in a menopausal setting. Another sister had ovarian cancer. One of the paternal uncles had cancer of the colon "and back".  The patient's mother died at the age of 40. She was found to have breast cancer shortly before dying , during her final hospitalization.   GYNECOLOGIC HISTORY:  No LMP recorded. Patient has had a hysterectomy.  Menarche age 88, first live birth age 10, the patient is GX P1. She underwent hysterectomy in 1980. She thinks the ovaries were removed, but the CT scan obtained 08/24/2014 showed a definite right ovary. The left ovary may have been removed. She did not take hormone replacement after the hysterectomy.   SOCIAL HISTORY:   Stpehanie worked in  construction administration. She is divorced, lives alone, with 2 cats. Her son Bethann Berkshire lives in [place] where he works in Engineer, technical sales. He has 4 children of his own. The patient is a Psychologist, forensic.   ADVANCED DIRECTIVES:  In place. The patient has named her sister Pleas Koch [phone]) and her friend Janina Mayo [phone]) as joint healthcare powers of attorney   HEALTH MAINTENANCE: Social History   Tobacco Use  . Smoking status: Former Smoker    Packs/day: 0.50    Years: 10.00    Pack years: 5.00    Types: Cigarettes    Quit date: 05/03/1970    Years since quitting: 49.8  . Smokeless tobacco: Former Systems developer  . Tobacco comment: quit smoking 1970's  Substance Use Topics  . Alcohol use: Yes    Alcohol/week: 0.0 standard drinks    Comment: Rare  . Drug use: No     Colonoscopy: never  PAP: status post hysterectomy  Bone density: 09/12/2016 [facility]/ T score of -4.8 osteoporosis  Lipid panel:  Allergies  Allergen Reactions  . Ativan [Lorazepam]     Made her  crazy  . Dilaudid [Hydromorphone Hcl]     Doesn't want  . Haldol [Haloperidol Lactate]     Made her crazy    Current Outpatient Medications  Medication Sig Dispense Refill  . Ascorbic Acid (VITAMIN C PO) Take 1 tablet by mouth every other day. Takes qod    . aspirin EC 325 MG tablet Take 1 tablet (325 mg total) by mouth daily. 30 tablet 0  . Cholecalciferol (VITAMIN D3 ADULT GUMMIES PO) Take 2,000 Units by mouth daily.     . Cyanocobalamin (VITAMIN B-12 PO) Take by mouth daily. Takes every other day    . Iron-Vitamins (GERITOL PO) Take by mouth daily.    . palbociclib (IBRANCE) 75 MG tablet Take 1 tablet (75 mg total) by mouth daily. Take for 21 days on, 7 days off, repeat every 28 days. Start 01/04/2020 21 tablet 6   No current facility-administered medications for this visit.    OBJECTIVE: White woman who appears stated age  26:   02/17/20 1047  BP: 136/87  Pulse: 95  Resp: 18  Temp: (!) 97.1 F (36.2 C)  SpO2: 97%   Wt Readings from Last 3 Encounters:  02/17/20 121 lb 14.4 oz (55.3 kg)  02/01/20 122 lb 1.6 oz (55.4 kg)  01/04/20 122 lb (55.3 kg)   Body mass index is 22.66 kg/m.    ECOG FS:1 - Symptomatic but completely ambulatory  GENERAL: Patient is a well appearing female in no acute distress HEENT:  Sclerae anicteric.  Mask in place.  Neck is supple.  NODES:  No cervical, supraclavicular, or axillary lymphadenopathy palpated.  BREAST EXAM:  Deferred. LUNGS:  Clear to auscultation bilaterally.  No wheezes or rhonchi. HEART:  Regular rate and rhythm. No murmur appreciated. ABDOMEN:  Soft, nontender.  Positive, normoactive bowel sounds. No organomegaly palpated. MSK:  No focal spinal tenderness to palpation. Full range of motion bilaterally in the upper extremities. EXTREMITIES:  No peripheral edema.    SKIN:  Erythematous lesion on left lower leg, macular, asymmetric, and irregular borders about 2 cm, other small macular areas, about 56m in size, well  circumscribed.   NEURO:  Nonfocal. Well oriented.  Appropriate affect.     LAB RESULTS:  CMP     Component Value Date/Time   NA 140 02/10/2020 1126   NA 141 05/29/2017 1417   K 4.5 02/10/2020 1126  K 3.6 05/29/2017 1417   CL 105 02/10/2020 1126   CO2 30 02/10/2020 1126   CO2 27 05/29/2017 1417   GLUCOSE 87 02/10/2020 1126   GLUCOSE 111 05/29/2017 1417   BUN 12 02/10/2020 1126   BUN 17.0 05/29/2017 1417   CREATININE 0.74 02/10/2020 1126   CREATININE 0.77 12/02/2019 1147   CREATININE 0.65 10/03/2019 1205   CREATININE 0.8 05/29/2017 1417   CALCIUM 10.3 02/10/2020 1126   CALCIUM 10.7 (H) 06/26/2017 1216   CALCIUM 10.4 05/29/2017 1417   PROT 7.2 02/10/2020 1126   PROT 7.3 05/29/2017 1417   ALBUMIN 3.7 02/10/2020 1126   ALBUMIN 3.9 05/29/2017 1417   AST 21 02/10/2020 1126   AST 34 12/02/2019 1147   AST 17 05/29/2017 1417   ALT 16 02/10/2020 1126   ALT 30 12/02/2019 1147   ALT 15 05/29/2017 1417   ALKPHOS 59 02/10/2020 1126   ALKPHOS 42 05/29/2017 1417   BILITOT 0.4 02/10/2020 1126   BILITOT 0.4 12/02/2019 1147   BILITOT 0.30 05/29/2017 1417   GFRNONAA >60 02/10/2020 1126   GFRNONAA >60 12/02/2019 1147   GFRNONAA 84 10/03/2019 1205   GFRAA >60 02/10/2020 1126   GFRAA >60 12/02/2019 1147   GFRAA 97 10/03/2019 1205    INo results found for: SPEP, UPEP  Lab Results  Component Value Date   WBC 2.7 (L) 02/17/2020   NEUTROABS 1.2 (L) 02/17/2020   HGB 13.9 02/17/2020   HCT 41.0 02/17/2020   MCV 94.9 02/17/2020   PLT 260 02/17/2020      Chemistry      Component Value Date/Time   NA 140 02/10/2020 1126   NA 141 05/29/2017 1417   K 4.5 02/10/2020 1126   K 3.6 05/29/2017 1417   CL 105 02/10/2020 1126   CO2 30 02/10/2020 1126   CO2 27 05/29/2017 1417   BUN 12 02/10/2020 1126   BUN 17.0 05/29/2017 1417   CREATININE 0.74 02/10/2020 1126   CREATININE 0.77 12/02/2019 1147   CREATININE 0.65 10/03/2019 1205   CREATININE 0.8 05/29/2017 1417      Component Value  Date/Time   CALCIUM 10.3 02/10/2020 1126   CALCIUM 10.7 (H) 06/26/2017 1216   CALCIUM 10.4 05/29/2017 1417   ALKPHOS 59 02/10/2020 1126   ALKPHOS 42 05/29/2017 1417   AST 21 02/10/2020 1126   AST 34 12/02/2019 1147   AST 17 05/29/2017 1417   ALT 16 02/10/2020 1126   ALT 30 12/02/2019 1147   ALT 15 05/29/2017 1417   BILITOT 0.4 02/10/2020 1126   BILITOT 0.4 12/02/2019 1147   BILITOT 0.30 05/29/2017 1417       Lab Results  Component Value Date   LABCA2 24 01/01/2016    No components found for: [id number]  No results for input(s): INR in the last 168 hours.  Urinalysis    Component Value Date/Time   COLORURINE YELLOW 10/19/2019 1120   APPEARANCEUR TURBID (A) 10/19/2019 1120   LABSPEC 1.013 10/19/2019 1120   PHURINE 6.5 10/19/2019 1120   GLUCOSEU NEGATIVE 10/19/2019 1120   HGBUR 1+ (A) 10/19/2019 1120   BILIRUBINUR NEGATIVE 09/19/2015 1239   KETONESUR NEGATIVE 10/19/2019 1120   PROTEINUR 1+ (A) 10/19/2019 1120   NITRITE NEGATIVE 10/19/2019 1120   LEUKOCYTESUR 3+ (A) 10/19/2019 1120    STUDIES: No results found.   ASSESSMENT: 79 y.o. [place] woman with stage IV left breast cancer involving bone  (1) status post right lumpectomy and axillary node dissection in 1985 followed by radiation  at [facility] 08/16/2014: measurable disease in spine, lung and left breast   (2) evaluation for left shoulder pain led to thoracic spine MRI 08/16/2014 showing a pathologic fracture at T3 with epidural tumor displacing the cord to the right, but no cord compression. CT scans of the chest, abdomen and pelvis 08/24/2014 showed in addition a mass in the upper outer quadrant left breast measuring 1.6 cm and a nodule in the minor fissure of the right lung measuring 1.2 cm, but no parenchymal lung or liver lesions.   (a) CA 27-29 was noninformative at 38 (09/20/2014)    (3) mammography and ultrasonography 08/29/2014 show a mass in the upper inner left breast which was  palpable,  measuring 2.0 cm by ultrasound. Biopsy of this mass 08/29/2014 showed an invasive breast cancer with both lobular and ductal features, estrogen receptor positive, progesterone receptor weakly positive, with an MIB-1 in the 40% range, HER-2 equivocal (6 else ratio 1.5, but average number her nucleus 5.8)   (4) letrozole started 08/31/2014;   (a) palbociclib added Sept 2016 at 75 mg 21/7, with significant neutropenia; not repeated after first cycle  (b) letrozole discontinued 05/29/2017 with evidence of disease progression in the breast   (5)  zolendronate started 09/20/2014, stopped after initial dose due to poor tolerance  (a) denosumab/ Xgeva started 01/31/2015, repeated every 4 weeks  (b) changed to every 8 weeks as of August 2018.   (c) every 12 weeks recommended and ordered to start 01/2020   (6) on 10/20/2014 the patient underwent T2-T3 and T4 decompressive laminectomy with removal of epidural tumor, C7-T4 segmental pedicle screw instrumentation with virage screw system with arrow guidance protocol and C7-T4 posterolateral fusion. The cells were positive for the estrogen receptor. HER-2/neu testing by [facility] showed again equivocal results,  (a) most recent bone scan 10/01/2016 finds no new lesions only postoperative changes  (7) radiation 11/23/2014-12/11/2014.  (a) T1-T5 was treated to 30 Gy in 12 fractions at 2.5 Gy per fraction   (8) initiated close follow-up while considering eventual left lumpectomy or mastectomy depending on  longer-term results of systemic therapy  (a) most recent left breast ultrasonography 10/30/2016 found this mass to measure 0.7 cm  (b) repeat left breast ultrasonography 05/13/2017 showed the upper outer quadrant mass to have grown to 1.4 cm  (c) left breast ultrasound 09/08/2017 shows the mass to now measure 0.9 cm  (d) left breast biopsy x2 on 12/11/2017 shows high-grade ductal carcinoma in situ, essentially estrogen receptor negative  (9) genetics  testing using the Breast/Ovarian Cancer Panel through [facility] [name], MD) found no deleterious mutations in ATM, BARD1, BRCA1, BRCA2, BRIP1, CDH1, CHEK2, EPCAM, FANCC, MLH1, MSH2, MSH6, NBN, PALB2, PMS2, PTEN, RAD51C, RAD51D, STK11, TP53, or XRCC2    (10) right thyroid nodule is a complex cyst as noted on CT scan of the neck 01/29/2016  (11) osteoporosis: Bone density at [facility] 09/12/2016 showed a T score of -4.8.  (a) continue denosumab/Xgeva as above  (12) fulvestrant started 05/29/2017, last dose 07/22/2018 (discontinued due to pandemic).  (a) started palbociclib 06/29/2017 at 75 mg every other day for 21 days on, 7 days off  (b) palbociclib discontinued on 3/29 due to progressive fatigue and patient preference  (13)  was to start abemaciclib 02/04/2018, but patient opted for going back to palbociclib at a lower dose beginning in November 19, patient subsequently declined s palbociclib, and  proceeded to surgery  (14) status post left lumpectomy 05/03/2018 showing a pT1b pNX invasive ductal  carcinoma, grade 2, with equivocal HER-2 results  (a) opted against adjuvant radiation given presence of stage IV disease  (15) tamoxifen supposed to have been started started 09/13/2018 as a "bridge" pending resumption of fulvestrant/denosumab, but never started by the patient  (16) PET scan 11/02/2018 showed no active disease  (a) cerianna scan on 10/31/2019 shows activity in 2 liver lesions, no active bone or lung lesions  (b) abdominal ultrasound 11/08/2019 confirms 2 liver lesions measuring 4.4 and 2.1 cm.  (c) biopsy of the liver lesions  (17) letrozole started 02/15/2019, discontinued 12/02/2019 with evidence of progression  (a) bone density 09/12/2016 showed a T score of -4.8.  (18) fulvestrant resumed 12/08/2019  (a) palbociclib added at 75 mg daily 21/7 starting 12/20/2019    PLAN: Pat is here today for evaluation of a new rash that started a little more than a week ago.  I am  unsure what this rash is from.  I prescribed a prednisone taper for her to take with meals daily for the next 6 days.  I also sent in triamcinolone ointment.  We will see if this helps.  She is due to restart Palbociclib on 02/20/2020.  I let her know that she should go ahead and restart it to see if the rash returns/worsens or not.  She let me know that she will likely restart on 02/21/2020 or 02/22/2020 so she doesn't have to take so many pills in one day and will be towards the end of the prednisone taper.  This is understandable and acceptable, to do this.    Her dizziness sounds like very mild vertigo.  She has no allergies. I recommended she drink plenty of fluids, and to let us know if it gets worse.  She may need some PT to help with this.    Jewelia will call us and let us know if her rash worsens.  She will f/u with Dr. Jana Hakim on 03/01/2020 for labs, and have an injection afterwards.  She knows to call for any questions that may arise between now and her next appointment.  We are happy to see her sooner if needed.   Total encounter time: 20 minutes*  Wilber Bihari, NP 02/17/20 11:15 AM Medical Oncology and Hematology [facility] [place], [place] [address] Tel. [phone]    Fax. [phone]  *Total Encounter Time as defined by the Centers for Medicare and Medicaid Services includes, in addition to the face-to-face time of a patient visit (documented in the note above) non-face-to-face time: obtaining and reviewing outside history, ordering and reviewing medications, tests or procedures, care coordination (communications with other health care professionals or caregivers) and documentation in the medical record.

## 2020-02-20 ENCOUNTER — Telehealth: Payer: Self-pay | Admitting: Adult Health

## 2020-02-20 NOTE — Telephone Encounter (Signed)
No 9/24 los. No changes made to pt's schedule.

## 2020-02-29 ENCOUNTER — Other Ambulatory Visit: Payer: Medicare Other

## 2020-02-29 ENCOUNTER — Ambulatory Visit: Payer: Medicare Other

## 2020-02-29 NOTE — Progress Notes (Signed)
April Rosales  Telephone:([phone] Fax:([phone]) [phone]    ID: April Rosales DOB: [birth year]  MR#: [id number]  April Rosales#:[id number]  Patient Care Team: [name], MD as PCP - General (Internal Medicine) [name], [name], MD as Consulting Physician (Oncology) [name], MD as Consulting Physician (Orthopedic Surgery) [name], MD as Consulting Physician (General Surgery) OTHER: [name], DDS   CHIEF COMPLAINT: Stage IV estrogen receptor positive breast cancer  CURRENT TREATMENT: Fulvestrant, palbociclib; Prolia   INTERVAL HISTORY: April Rosales is here today for follow up of her metastatic breast cancer.  Her sister who usually accompanies her is not here today as she is "having fun of the beach".  [name] continues on Fulvestrant every 4 weeks, together with palbociclib 80m daily 3 weeks on and 1 week off.  She tolerates these treatments remarkably well.  She does not mind the Faslodex injections although occasionally she is a little sore.  She does not have unusual fatigue or nausea from the palbociclib.  She last received denosumab/Xgeva 07/22/2018.  She is not aware of any dental issues at this point.  She was last seen here on 02/17/2020 for a new rash. She was started on a prednisone taper and triamcinolone ointment.  The rash has now resolved and she resumed her Ibrance, which had been interrupted.  Currently she is on day 7 of the current cycle  Most recent cerianna PET scan 10/28/2019 showed new low-density liver lesions measuring 2.9 and 1.5 cm.  These were biopsied and found to be not only estrogen receptor positive but also HER-2 positive which was a change.  This is being discussed today.   REVIEW OF SYSTEMS:  CJaidynnfeels generally fine.  At home she does her housework and a little bit of cooking but not much else.  She enjoys reading.  She has had the Pfizer vaccine x2 and did fine with that.  A detailed review of systems today was  otherwise entirely stable   BREAST CANCER HISTORY: From the original intake note:   [facility]. She tells me she had more than 30 lymph nodes removed from her right axilla and all of them were clear. She received  adjuvant radiation but no systemic treatment.  The patient had recently refused mammography with the last mammogram I can find dating back to August 2010.  More recently the patient presented with left scapular pain radiating down the left arm.  She was evaluated by Dr. YLorin Mercy who obtained a chest x-ray showing a possible abnormality at T3. He then set up the patient for an MRI of the thoracic spine performed 08/16/2014.  This showed multiple compression fractures  (a similar picture had been noted on lumbar MRI 10/09/2011 ). However at T3 they noted tumor in the vertebral body extending into the left pedicle and into the lateral epidural space, displacing the cord to the right. There was no evidence of cord compression or cord signal abnormality. There were no other areas of tumor identified in the thoracic spine.         The patient was then referred to Dr. KHal Neerwho on 08/24/2014 set [name] for CT scans of the chest, abdomen and pelvis. There was a dense mass in the upper inner quadrant of the left breast measuring 1.6 cm. There was a 1.2 cm nodule in the minor fissure of the right lung and some evidence of  right lung fibrosis at the site of the prior radiation port.  There was also a thyroid mass measuring 2 cm. However there were no parenchymal lung or liver lesions. Incidental meningoceles were noted as well as sclerosis of the fifth and sixth ribs which were felt to be likely posttraumatic.   On 08/29/2014 the patient underwent bilateral diagnostic mammography with tomosynthesis and left breast ultrasonography.  There were postsurgical changes in the upper right breast. In the  left breast there was an irregular mass measuring 2.3 cm in the upper inner quadrant. This was palpable. Ultrasound showed this to be hypoechoic and to measure 2.0 cm. There were adjacent areas of nodularity. There was no definite lymphadenopathy in the left axilla.  Biopsy of this breast mass 08/29/2014 showed an invasive adenocarcinoma with both ductal and lobular features (there was strong diffuse E-cadherin expression as well as areas with total absence of E-cadherin expression), with the preliminary prognostic profile showing strong estrogen positivity, very weak to near absent progesterone positivity, an MIB-1 of approximately 40%, and HER-2 equivocal  The patient's subsequent history is as detailed below.   PAST MEDICAL HISTORY: Past Medical History:  Diagnosis Date  . Arthritis   . Breast cancer [facility]) 1985/2016   takes [name] daily  . Chronic back pain    stenosis/listhesis  . Family history of ovarian cancer   . Osteoporosis    takes Vit D  . Radiation 11/23/14-12/11/14   30 Gy T1-T5     PAST SURGICAL HISTORY: Past Surgical History:  Procedure Laterality Date  . ABDOMINAL HYSTERECTOMY     1980  . APPENDECTOMY  1977  . APPLICATION OF INTRAOPERATIVE CT SCAN N/A 10/20/2014   Procedure: APPLICATION OF INTRAOPERATIVE CAT SCAN;  Surgeon: [name], MD;  Location: [place] [facility];  Service: Neurosurgery;  Laterality: N/A;  . BREAST LUMPECTOMY WITH RADIOACTIVE SEED LOCALIZATION Left 05/03/2018   Procedure: LEFT BREAST LUMPECTOMY WITH BRACKETED RADIOACTIVE SEED LOCALIZATION;  Surgeon: [name], MD;  Location: [place];  Service: General;  Laterality: Left;  . BREAST SURGERY Right 1985  . THYROID CYST EXCISION  1967    FAMILY HISTORY Family History  Problem Relation Age of Onset  . Heart disease Mother   . Hypertension Mother   . Heart disease Father   . Diabetes Father   . Breast cancer Paternal Aunt        4 paternal aunts with breast cancer over 57    . Prostate cancer Paternal Uncle   . Stroke Paternal Grandfather   . Ovarian cancer Paternal Aunt   . Huntington's disease Other        Nephew, inherited from his father   The patient's father died from a heart attack at the age of 21. He had 9 sisters. 3 of those sisters had breast cancer, all in a menopausal setting. Another sister had ovarian cancer. One of the paternal uncles had cancer of the colon "and back".  The patient's mother died at the age of 77. She was found to have breast cancer shortly before dying , during her final hospitalization.   GYNECOLOGIC HISTORY:  No LMP recorded. Patient has had a hysterectomy.  Menarche age 25, first live birth age 49, the patient is GX P1. She underwent hysterectomy in 1980. She thinks the ovaries were removed, but the CT scan obtained 08/24/2014 showed a definite right ovary. The left ovary may have been removed. She did not take hormone replacement after the hysterectomy.   SOCIAL HISTORY:  Malley worked in Psychiatric nurse. She is divorced, lives alone, with 2 cats. Her son Bethann Berkshire lives in [place] where he works in Engineer, technical sales. He has 4 children of his own. The patient is a Psychologist, forensic.   ADVANCED DIRECTIVES:  In place. The patient has named her sister Pleas Koch [phone]) and her friend Janina Mayo [phone]) as joint healthcare powers of attorney   HEALTH MAINTENANCE: Social History   Tobacco Use  . Smoking status: Former Smoker    Packs/day: 0.50    Years: 10.00    Pack years: 5.00    Types: Cigarettes    Quit date: 05/03/1970    Years since quitting: 49.8  . Smokeless tobacco: Former Systems developer  . Tobacco comment: quit smoking 1970's  Substance Use Topics  . Alcohol use: Yes    Alcohol/week: 0.0 standard drinks    Comment: Rare  . Drug use: No     Colonoscopy: never  PAP: status post hysterectomy  Bone density: 09/12/2016 [facility]/ T score of -4.8 osteoporosis  Lipid panel:  Allergies  Allergen Reactions   . Ativan [Lorazepam]     Made her crazy  . Dilaudid [Hydromorphone Hcl]     Doesn't want  . Haldol [Haloperidol Lactate]     Made her crazy    Current Outpatient Medications  Medication Sig Dispense Refill  . Ascorbic Acid (VITAMIN C PO) Take 1 tablet by mouth every other day. Takes qod    . aspirin EC 325 MG tablet Take 1 tablet (325 mg total) by mouth daily. 30 tablet 0  . Cholecalciferol (VITAMIN D3 ADULT GUMMIES PO) Take 2,000 Units by mouth daily.     . Cyanocobalamin (VITAMIN B-12 PO) Take by mouth daily. Takes every other day    . Iron-Vitamins (GERITOL PO) Take by mouth daily.    . palbociclib (IBRANCE) 75 MG tablet Take 1 tablet (75 mg total) by mouth daily. Take for 21 days on, 7 days off, repeat every 28 days. Start 01/04/2020 21 tablet 6  . predniSONE (STERAPRED UNI-PAK 21 TAB) 10 MG (21) TBPK tablet Taper 6,5,4,3,2,1 21 tablet 0  . triamcinolone ointment (KENALOG) 0.5 % Apply 1 application topically 2 (two) times daily. 30 g 0   No current facility-administered medications for this visit.    OBJECTIVE: White woman in no acute distress  Vitals:   03/01/20 1224  BP: (!) 146/76  Pulse: 98  Resp: 17  Temp: (!) 95 F (35 C)  SpO2: 95%   Wt Readings from Last 3 Encounters:  03/01/20 121 lb 1.6 oz (54.9 kg)  02/17/20 121 lb 14.4 oz (55.3 kg)  02/01/20 122 lb 1.6 oz (55.4 kg)   Body mass index is 22.51 kg/m.    ECOG FS:1 - Symptomatic but completely ambulatory  Sclerae unicteric, EOMs intact Wearing a mask No cervical or supraclavicular adenopathy Lungs no rales or rhonchi Heart regular rate and rhythm Abd soft, nontender, positive bowel sounds MSK no focal spinal tenderness, no upper extremity lymphedema Neuro: nonfocal, well oriented, appropriate affect Breasts: The right breast is status post lumpectomy.  There is no evidence of local recurrence.  Left breast is benign.  Both axillae are benign.   LAB RESULTS:  CMP     Component Value Date/Time    NA 140 03/01/2020 1154   NA 141 05/29/2017 1417   K 4.7 03/01/2020 1154   K 3.6 05/29/2017 1417   CL 105 03/01/2020 1154   CO2 30 03/01/2020 1154   CO2 27 05/29/2017 1417  GLUCOSE 103 (H) 03/01/2020 1154   GLUCOSE 111 05/29/2017 1417   BUN 14 03/01/2020 1154   BUN 17.0 05/29/2017 1417   CREATININE 0.79 03/01/2020 1154   CREATININE 0.77 12/02/2019 1147   CREATININE 0.65 10/03/2019 1205   CREATININE 0.8 05/29/2017 1417   CALCIUM 10.4 (H) 03/01/2020 1154   CALCIUM 10.7 (H) 06/26/2017 1216   CALCIUM 10.4 05/29/2017 1417   PROT 6.7 03/01/2020 1154   PROT 7.3 05/29/2017 1417   ALBUMIN 3.3 (L) 03/01/2020 1154   ALBUMIN 3.9 05/29/2017 1417   AST 22 03/01/2020 1154   AST 34 12/02/2019 1147   AST 17 05/29/2017 1417   ALT 23 03/01/2020 1154   ALT 30 12/02/2019 1147   ALT 15 05/29/2017 1417   ALKPHOS 61 03/01/2020 1154   ALKPHOS 42 05/29/2017 1417   BILITOT 0.4 03/01/2020 1154   BILITOT 0.4 12/02/2019 1147   BILITOT 0.30 05/29/2017 1417   GFRNONAA >60 03/01/2020 1154   GFRNONAA >60 12/02/2019 1147   GFRNONAA 84 10/03/2019 1205   GFRAA >60 02/17/2020 1037   GFRAA >60 12/02/2019 1147   GFRAA 97 10/03/2019 1205    INo results found for: SPEP, UPEP  Lab Results  Component Value Date   WBC 5.9 03/01/2020   NEUTROABS 4.3 03/01/2020   HGB 14.1 03/01/2020   HCT 41.7 03/01/2020   MCV 95.9 03/01/2020   PLT 300 03/01/2020      Chemistry      Component Value Date/Time   NA 140 03/01/2020 1154   NA 141 05/29/2017 1417   K 4.7 03/01/2020 1154   K 3.6 05/29/2017 1417   CL 105 03/01/2020 1154   CO2 30 03/01/2020 1154   CO2 27 05/29/2017 1417   BUN 14 03/01/2020 1154   BUN 17.0 05/29/2017 1417   CREATININE 0.79 03/01/2020 1154   CREATININE 0.77 12/02/2019 1147   CREATININE 0.65 10/03/2019 1205   CREATININE 0.8 05/29/2017 1417      Component Value Date/Time   CALCIUM 10.4 (H) 03/01/2020 1154   CALCIUM 10.7 (H) 06/26/2017 1216   CALCIUM 10.4 05/29/2017 1417   ALKPHOS 61  03/01/2020 1154   ALKPHOS 42 05/29/2017 1417   AST 22 03/01/2020 1154   AST 34 12/02/2019 1147   AST 17 05/29/2017 1417   ALT 23 03/01/2020 1154   ALT 30 12/02/2019 1147   ALT 15 05/29/2017 1417   BILITOT 0.4 03/01/2020 1154   BILITOT 0.4 12/02/2019 1147   BILITOT 0.30 05/29/2017 1417       Lab Results  Component Value Date   LABCA2 24 01/01/2016    No components found for: [id number]  No results for input(s): INR in the last 168 hours.  Urinalysis    Component Value Date/Time   COLORURINE YELLOW 10/19/2019 1120   APPEARANCEUR TURBID (A) 10/19/2019 1120   LABSPEC 1.013 10/19/2019 1120   PHURINE 6.5 10/19/2019 1120   GLUCOSEU NEGATIVE 10/19/2019 1120   HGBUR 1+ (A) 10/19/2019 1120   BILIRUBINUR NEGATIVE 09/19/2015 1239   KETONESUR NEGATIVE 10/19/2019 1120   PROTEINUR 1+ (A) 10/19/2019 1120   NITRITE NEGATIVE 10/19/2019 1120   LEUKOCYTESUR 3+ (A) 10/19/2019 1120    STUDIES: No results found.   ASSESSMENT: 79 y.o. [place] woman with stage IV left breast cancer involving bone  (1) status post right lumpectomy and axillary node dissection in 1985 followed by radiation at [place] [facility] 08/16/2014: measurable disease in spine, lung and left breast   (2) evaluation for left shoulder pain  led to thoracic spine MRI 08/16/2014 showing a pathologic fracture at T3 with epidural tumor displacing the cord to the right, but no cord compression. CT scans of the chest, abdomen and pelvis 08/24/2014 showed in addition a mass in the upper outer quadrant left breast measuring 1.6 cm and a nodule in the minor fissure of the right lung measuring 1.2 cm, but no parenchymal lung or liver lesions.   (a) CA 27-29 was noninformative at 38 (09/20/2014)    (3) mammography and ultrasonography 08/29/2014 show a mass in the upper inner left breast which was palpable,  measuring 2.0 cm by ultrasound. Biopsy of this mass 08/29/2014 showed an invasive breast cancer with both lobular and  ductal features, estrogen receptor positive, progesterone receptor weakly positive, with an MIB-1 in the 40% range, HER-2 equivocal (6 else ratio 1.5, but average number her nucleus 5.8)   (4) letrozole started 08/31/2014;   (a) palbociclib added Sept 2016 at 75 mg 21/7, with significant neutropenia; not repeated after first cycle  (b) letrozole discontinued 05/29/2017 with evidence of disease progression in the breast   (5)  zolendronate started 09/20/2014, stopped after initial dose due to poor tolerance  (a) denosumab/ Xgeva started 01/31/2015, repeated every 4 weeks  (b) changed to every 8 weeks as of August 2018.   (c) every 12 weeks recommended and ordered to start 01/2020   (6) on 10/20/2014 the patient underwent T2-T3 and T4 decompressive laminectomy with removal of epidural tumor, C7-T4 segmental pedicle screw instrumentation with virage screw system with arrow guidance protocol and C7-T4 posterolateral fusion. The cells were positive for the estrogen receptor. HER-2/neu testing by [facility] showed again equivocal results,  (a) most recent bone scan 10/01/2016 finds no new lesions only postoperative changes  (7) radiation 11/23/2014-12/11/2014.  (a) T1-T5 was treated to 30 Gy in 12 fractions at 2.5 Gy per fraction   (8) initiated close follow-up while considering eventual left lumpectomy or mastectomy depending on  longer-term results of systemic therapy  (a) most recent left breast ultrasonography 10/30/2016 found this mass to measure 0.7 cm  (b) repeat left breast ultrasonography 05/13/2017 showed the upper outer quadrant mass to have grown to 1.4 cm  (c) left breast ultrasound 09/08/2017 shows the mass to now measure 0.9 cm  (d) left breast biopsy x2 on 12/11/2017 shows high-grade ductal carcinoma in situ, essentially estrogen receptor negative  (9) genetics testing using the Breast/Ovarian Cancer Panel through [facility] [name], MD) found no deleterious mutations in ATM, BARD1, BRCA1,  BRCA2, BRIP1, CDH1, CHEK2, EPCAM, FANCC, MLH1, MSH2, MSH6, NBN, PALB2, PMS2, PTEN, RAD51C, RAD51D, STK11, TP53, or XRCC2    (10) right thyroid nodule is a complex cyst as noted on CT scan of the neck 01/29/2016  (11) osteoporosis: [facility] density at [facility] 09/12/2016 showed a T score of -4.8.  (a) switched to Prolia starting 03/29/2020  (12) fulvestrant started 05/29/2017, last dose 07/22/2018 (discontinued due to pandemic).  (a) started palbociclib 06/29/2017 at 75 mg every other day for 21 days on, 7 days off  (b) palbociclib discontinued on 3/29 due to progressive fatigue and patient preference  (13)  was to start abemaciclib 02/04/2018, but patient opted for going back to palbociclib at a lower dose beginning in November 19, patient subsequently declined s palbociclib, and  proceeded to surgery  (14) status post left lumpectomy 05/03/2018 showing a pT1b pNX invasive ductal carcinoma, grade 2, with equivocal HER-2 results  (a) opted against adjuvant radiation given presence of stage IV disease  (15)  tamoxifen supposed to have been started started 09/13/2018 as a "bridge" pending resumption of fulvestrant/denosumab, but never started by the patient  (16) PET scan 11/02/2018 showed no active disease  (a) cerianna scan on 10/31/2019 shows activity in 2 liver lesions, no active bone or lung lesions  (b) abdominal ultrasound 11/08/2019 confirms 2 liver lesions measuring 4.4 and 2.1 cm.  (c) biopsy of 1 of the liver lesions 12/12/2019 confirmed metastatic breast cancer, estrogen and progesterone receptor positive, with an MIB-1 of 10%, and HER-2 positive, with a copy number of 2.12 and the number per cell 6.25.  (17) letrozole started 02/15/2019, discontinued 12/02/2019 with evidence of progression  (a) bone density 09/12/2016 showed a T score of -4.8.  (18) fulvestrant resumed 12/08/2019  (a) palbociclib added at 75 mg daily 21/7 starting 12/20/2019    PLAN: Romell is now 5-1/2 years out  from definitive diagnosis of metastatic breast cancer.  She has no symptoms related to her disease.  She is tolerating her treatment remarkably well.  We reviewed her denosumab use and given the findings on the recent scans she will be switched to Prolia 60 mg every 6 months.  She will receive a dose in November.  Her tumor is now HER-2 positive.  She may benefit from trastuzumab.  I am setting her up for an echocardiogram.  She will also have restaging studies to see if the current treatment, which does not include anti-HER-2 treatment, is nevertheless effective as far as her measurable disease in the liver is concerned.  She will then return to see me in 4 weeks and at that point we should be able to make a decision whether or not to continue the current treatment and whether or not to add anti-HER-2 therapy to her regimen  Total encounter time today 30 minutes.Sarajane Jews C. Maythe Deramo, MD 03/01/20 2:51 PM Medical Oncology and Hematology [facility] [name], [place] [address] Tel. [phone]    Fax. [phone]   I, Wilburn Mylar, am acting as scribe for Dr. [name]. Channell Quattrone.  I, Lurline Del MD, have reviewed the above documentation for accuracy and completeness, and I agree with the above.    *Total Encounter Time as defined by the Centers for Medicare and Medicaid Services includes, in addition to the face-to-face time of a patient visit (documented in the note above) non-face-to-face time: obtaining and reviewing outside history, ordering and reviewing medications, tests or procedures, care coordination (communications with other health care professionals or caregivers) and documentation in the medical record.

## 2020-03-01 ENCOUNTER — Other Ambulatory Visit: Payer: Self-pay

## 2020-03-01 ENCOUNTER — Inpatient Hospital Stay: Payer: Medicare Other

## 2020-03-01 ENCOUNTER — Inpatient Hospital Stay (HOSPITAL_BASED_OUTPATIENT_CLINIC_OR_DEPARTMENT_OTHER): Payer: Medicare Other | Admitting: Oncology

## 2020-03-01 ENCOUNTER — Inpatient Hospital Stay: Payer: Medicare Other | Attending: Oncology

## 2020-03-01 VITALS — BP 146/76 | HR 98 | Temp 95.0°F | Resp 17 | Ht 61.5 in | Wt 121.1 lb

## 2020-03-01 DIAGNOSIS — C50919 Malignant neoplasm of unspecified site of unspecified female breast: Secondary | ICD-10-CM

## 2020-03-01 DIAGNOSIS — Z170 Estrogen receptor positive status [ER+]: Secondary | ICD-10-CM | POA: Diagnosis not present

## 2020-03-01 DIAGNOSIS — C50412 Malignant neoplasm of upper-outer quadrant of left female breast: Secondary | ICD-10-CM | POA: Insufficient documentation

## 2020-03-01 DIAGNOSIS — Z5111 Encounter for antineoplastic chemotherapy: Secondary | ICD-10-CM | POA: Insufficient documentation

## 2020-03-01 DIAGNOSIS — C7951 Secondary malignant neoplasm of bone: Secondary | ICD-10-CM

## 2020-03-01 DIAGNOSIS — M810 Age-related osteoporosis without current pathological fracture: Secondary | ICD-10-CM

## 2020-03-01 DIAGNOSIS — M818 Other osteoporosis without current pathological fracture: Secondary | ICD-10-CM | POA: Diagnosis not present

## 2020-03-01 LAB — CBC WITH DIFFERENTIAL/PLATELET
Abs Immature Granulocytes: 0.02 10*3/uL (ref 0.00–0.07)
Basophils Absolute: 0.1 10*3/uL (ref 0.0–0.1)
Basophils Relative: 2 %
Eosinophils Absolute: 0.4 10*3/uL (ref 0.0–0.5)
Eosinophils Relative: 6 %
HCT: 41.7 % (ref 36.0–46.0)
Hemoglobin: 14.1 g/dL (ref 12.0–15.0)
Immature Granulocytes: 0 %
Lymphocytes Relative: 14 %
Lymphs Abs: 0.8 10*3/uL (ref 0.7–4.0)
MCH: 32.4 pg (ref 26.0–34.0)
MCHC: 33.8 g/dL (ref 30.0–36.0)
MCV: 95.9 fL (ref 80.0–100.0)
Monocytes Absolute: 0.3 10*3/uL (ref 0.1–1.0)
Monocytes Relative: 5 %
Neutro Abs: 4.3 10*3/uL (ref 1.7–7.7)
Neutrophils Relative %: 73 %
Platelets: 300 10*3/uL (ref 150–400)
RBC: 4.35 MIL/uL (ref 3.87–5.11)
RDW: 17 % — ABNORMAL HIGH (ref 11.5–15.5)
WBC: 5.9 10*3/uL (ref 4.0–10.5)
nRBC: 0 % (ref 0.0–0.2)

## 2020-03-01 LAB — COMPREHENSIVE METABOLIC PANEL
ALT: 23 U/L (ref 0–44)
AST: 22 U/L (ref 15–41)
Albumin: 3.3 g/dL — ABNORMAL LOW (ref 3.5–5.0)
Alkaline Phosphatase: 61 U/L (ref 38–126)
Anion gap: 5 (ref 5–15)
BUN: 14 mg/dL (ref 8–23)
CO2: 30 mmol/L (ref 22–32)
Calcium: 10.4 mg/dL — ABNORMAL HIGH (ref 8.9–10.3)
Chloride: 105 mmol/L (ref 98–111)
Creatinine, Ser: 0.79 mg/dL (ref 0.44–1.00)
GFR calc non Af Amer: >60 mL/min (ref >60)
Glucose, Bld: 103 mg/dL — ABNORMAL HIGH (ref 70–99)
Potassium: 4.7 mmol/L (ref 3.5–5.1)
Sodium: 140 mmol/L (ref 135–145)
Total Bilirubin: 0.4 mg/dL (ref 0.3–1.2)
Total Protein: 6.7 g/dL (ref 6.5–8.1)

## 2020-03-01 MED ORDER — FULVESTRANT 250 MG/5ML IM SOLN
500.0000 mg | Freq: Once | INTRAMUSCULAR | Status: AC
  Administered 2020-03-01: 500 mg via INTRAMUSCULAR

## 2020-03-01 MED ORDER — FULVESTRANT 250 MG/5ML IM SOLN
INTRAMUSCULAR | Status: AC
  Filled 2020-03-01: qty 10

## 2020-03-01 NOTE — Patient Instructions (Signed)
Fulvestrant injection What is this medicine? FULVESTRANT (ful VES trant) blocks the effects of estrogen. It is used to treat breast cancer. This medicine may be used for other purposes; ask your health care provider or pharmacist if you have questions. COMMON BRAND NAME(S): FASLODEX What should I tell my health care provider before I take this medicine? They need to know if you have any of these conditions:  bleeding disorders  liver disease  low blood counts, like low white cell, platelet, or red cell counts  an unusual or allergic reaction to fulvestrant, other medicines, foods, dyes, or preservatives  pregnant or trying to get pregnant  breast-feeding How should I use this medicine? This medicine is for injection into a muscle. It is usually given by a health care professional in a hospital or clinic setting. Talk to your pediatrician regarding the use of this medicine in children. Special care may be needed. Overdosage: If you think you have taken too much of this medicine contact a poison control center or emergency room at once. NOTE: This medicine is only for you. Do not share this medicine with others. What if I miss a dose? It is important not to miss your dose. Call your doctor or health care professional if you are unable to keep an appointment. What may interact with this medicine?  medicines that treat or prevent blood clots like warfarin, enoxaparin, dalteparin, apixaban, dabigatran, and rivaroxaban This list may not describe all possible interactions. Give your health care provider a list of all the medicines, herbs, non-prescription drugs, or dietary supplements you use. Also tell them if you smoke, drink alcohol, or use illegal drugs. Some items may interact with your medicine. What should I watch for while using this medicine? Your condition will be monitored carefully while you are receiving this medicine. You will need important blood work done while you are taking  this medicine. Do not become pregnant while taking this medicine or for at least 1 year after stopping it. Women of child-bearing potential will need to have a negative pregnancy test before starting this medicine. Women should inform their doctor if they wish to become pregnant or think they might be pregnant. There is a potential for serious side effects to an unborn child. Men should inform their doctors if they wish to father a child. This medicine may lower sperm counts. Talk to your health care professional or pharmacist for more information. Do not breast-feed an infant while taking this medicine or for 1 year after the last dose. What side effects may I notice from receiving this medicine? Side effects that you should report to your doctor or health care professional as soon as possible:  allergic reactions like skin rash, itching or hives, swelling of the face, lips, or tongue  feeling faint or lightheaded, falls  pain, tingling, numbness, or weakness in the legs  signs and symptoms of infection like fever or chills; cough; flu-like symptoms; sore throat  vaginal bleeding Side effects that usually do not require medical attention (report to your doctor or health care professional if they continue or are bothersome):  aches, pains  constipation  diarrhea  headache  hot flashes  nausea, vomiting  pain at site where injected  stomach pain This list may not describe all possible side effects. Call your doctor for medical advice about side effects. You may report side effects to FDA at 1-800-FDA-1088. Where should I keep my medicine? This drug is given in a hospital or clinic and will   not be stored at home. NOTE: This sheet is a summary. It may not cover all possible information. If you have questions about this medicine, talk to your doctor, pharmacist, or health care provider.  2020 Elsevier/Gold Standard (2017-08-20 11:34:41)  

## 2020-03-02 ENCOUNTER — Encounter: Payer: Medicare Other | Admitting: Internal Medicine

## 2020-03-04 ENCOUNTER — Encounter: Payer: Self-pay | Admitting: Internal Medicine

## 2020-03-04 NOTE — Patient Instructions (Signed)

## 2020-03-04 NOTE — Progress Notes (Signed)
Comprehensive Evaluation &  Examination      This very nice 79 y.o.  DWF presents for a  comprehensive evaluation and management of multiple medical co-morbidities.  Patient has been followed for HTN, HLD, Prediabetes  and Vitamin D Deficiency.      Patient has hx/o Rt Breast Lumpectomy (+) for Ca in 1985.  In 2006 she was dx'd with a Breast Ca Metastasis to the T-spine. Since then, she has been followed by Dr Jana Hakim and managed  with Chemotherapy and subsequent Tamoxifen. More recently in July, she was dx'd with a metastasis to the liver and was started back on Chenotherapy.       Patient REFUSES to get a Mammogram - says "they already cut all the cancer out" .       Patient has been followed for longstanding l;abile HTN. Patient's BP has been controlled at home and patient denies any cardiac symptoms as chest pain, palpitations, shortness of breath, dizziness or ankle swelling. Today's BP is at goal - 116/80.       Patient's hyperlipidemia is not controlled with diet. Last lipids were not at goal:  Lab Results  Component Value Date   CHOL 184 11/25/2017   HDL 55 11/25/2017   LDLCALC 109 (H) 11/25/2017   TRIG 100 11/25/2017   CHOLHDL 3.3 11/25/2017       Patient has hx/o glucose intolerance and patient denies reactive hypoglycemic symptoms, visual blurring, diabetic polys or paresthesias. Last A1c was Normal & at goal:  Lab Results  Component Value Date   HGBA1C 5.3 02/08/2019       Finally, patient has history of Vitamin D Deficiency and last Vitamin D was at goal:  Lab Results  Component Value Date   VD25OH 64 02/08/2019    Current Outpatient Medications on File Prior to Visit  Medication Sig  . Ascorbic Acid (VITAMIN C PO) Take 1 tablet by mouth every other day. Takes qod  . aspirin EC 325 MG tablet Take 1 tablet (325 mg total) by mouth daily.  Mar[place] Kitchen BIOTIN  Take 1 tablet by mouth daily.  Mar[place] Kitchen VITAMIN D3  GUMMIES  Take 2,000 Units by mouth daily.   Marland Kitchen VITAMIN B-12 tab  Take by mouth daily. Takes every other day  . Iron-Vitamins (GERITOL ) Take by mouth daily.  Marland Kitchen Apple Cider Vinegar 1 capsule daily.  . palbociclib (IBRANCE) 75 MG tablet Take 1 tablet  Daily -21 days on, 7 days off  . triamcinolone oint 0.5 % Apply 1 application topically 2 (two) times daily.   No current facility-administered medications on file prior to visit.   Allergies  Allergen Reactions  . Ativan [Lorazepam]     Made her crazy  . Dilaudid [Hydromorphone Hcl]     Doesn't want  . Haldol [Haloperidol Lactate]     Made her crazy   Past Medical History:  Diagnosis Date  . Arthritis   . Breast cancer [facility]) 1985/2016   takes [name] daily  . Chronic back pain    stenosis/listhesis  . Family history of ovarian cancer   . Osteoporosis    takes Vit D  . Radiation 11/23/14-12/11/14   30 Gy T1-T5    Health Maintenance  Topic Date Due  . COVID-19 Vaccine (1) Never done  . PNA vac Low Risk Adult (1 of 2 - PCV13) Never done  . TETANUS/TDAP  01/04/2018  . INFLUENZA VACCINE  12/25/2019  . DEXA SCAN  Completed   Immunization History  Administered Date(s) Administered  . Influenza Split 02/07/2013, 02/27/2017  . Influenza, High Dose Seasonal PF 03/14/2015, 03/24/2016, 03/05/2018  . Td 01/05/2008    Last Colon - Last Colon - Never - & repeatedly has declined  Colonoscopy  Last MGM - 05/03/2018   Past Surgical History:  Procedure Laterality Date  . ABDOMINAL HYSTERECTOMY     1980  . APPENDECTOMY  1977  . APPLICATION OF INTRAOPERATIVE CT SCAN N/A 10/20/2014   Procedure: APPLICATION OF INTRAOPERATIVE CAT SCAN;  Surgeon: [name], MD;  Location: [place] [facility];  Service: Neurosurgery;  Laterality: N/A;  . BREAST LUMPECTOMY WITH RADIOACTIVE SEED LOCALIZATION Left 05/03/2018   Procedure: LEFT BREAST LUMPECTOMY WITH BRACKETED RADIOACTIVE SEED LOCALIZATION;  Surgeon: [name], MD;  Location: [place];  Service: General;  Laterality: Left;  . BREAST SURGERY  Right 1985  . THYROID CYST EXCISION  1967   Family History  Problem Relation Age of Onset  . Heart disease Mother   . Hypertension Mother   . Heart disease Father   . Diabetes Father   . Breast cancer Paternal Aunt        4 paternal aunts with breast cancer over 17  . Prostate cancer Paternal Uncle   . Stroke Paternal Grandfather   . Ovarian cancer Paternal Aunt   . Huntington's disease Other        Nephew, inherited from his father   Social History   Tobacco Use  . Smoking status: Former Smoker    Packs/day: 0.50    Years: 10.00    Pack years: 5.00    Types: Cigarettes    Quit date: 05/03/1970    Years since quitting: 49.8  . Smokeless tobacco: Former Systems developer  . Tobacco comment: quit smoking 1970's  Substance Use Topics  . Alcohol use: Yes    Alcohol/week: 0.0 standard drinks    Comment: Rare  . Drug use: No    ROS Constitutional: Denies fever, chills, weight loss/gain, headaches, insomnia,  night sweats, and change in appetite. Does c/o fatigue. Eyes: Denies redness, blurred vision, diplopia, discharge, itchy, watery eyes.  ENT: Denies discharge, congestion, post nasal drip, epistaxis, sore throat, earache, hearing loss, dental pain, Tinnitus, Vertigo, Sinus pain, snoring.  Cardio: Denies chest pain, palpitations, irregular heartbeat, syncope, dyspnea, diaphoresis, orthopnea, PND, claudication, edema Respiratory: denies cough, dyspnea, DOE, pleurisy, hoarseness, laryngitis, wheezing.  Gastrointestinal: Denies dysphagia, heartburn, reflux, water brash, pain, cramps, nausea, vomiting, bloating, diarrhea, constipation, hematemesis, melena, hematochezia, jaundice, hemorrhoids Genitourinary: Denies dysuria, frequency, urgency, nocturia, hesitancy, discharge, hematuria, flank pain Breast: Breast lumps, nipple discharge, bleeding.  Musculoskeletal: Denies arthralgia, myalgia, stiffness, Jt. Swelling, pain, limp, and strain/sprain. Denies falls. Skin: Denies puritis, rash, hives,  warts, acne, eczema, changing in skin lesion Neuro: No weakness, tremor, incoordination, spasms, paresthesia, pain Psychiatric: Denies confusion, memory loss, sensory loss. Denies Depression. Endocrine: Denies change in weight, skin, hair change, nocturia, and paresthesia, diabetic polys, visual blurring, hyper / hypo glycemic episodes.  Heme/Lymph: No excessive bleeding, bruising, enlarged lymph nodes.  Physical Exam  BP 116/80   Pulse 63   Temp (!) 97.2 F (36.2 C)   Resp 16   Ht 5' 1.5" (1.562 m)   Wt 120 lb 9.6 oz (54.7 kg)   BMI 22.42 kg/m   General Appearance: Well nourished and in no apparent distress.  Eyes: PERRLA, EOMs, conjunctiva no swelling or erythema, normal fundi and vessels. Sinuses: No frontal/maxillary tenderness ENT/Mouth: EACs patent / TMs  nl. Nares clear without erythema, swelling,  mucoid exudates. Oral hygiene is good. No erythema, swelling, or exudate. Tongue normal, non-obstructing. Tonsils not swollen or erythematous. Hearing normal.  Neck: Supple, thyroid not palpable. No bruits, nodes or JVD. Respiratory: Respiratory effort normal.  BS equal and clear bilateral without rales, rhonci, wheezing or stridor. Cardio: Heart sounds are normal with regular rate and rhythm and no murmurs, rubs or gallops. Peripheral pulses are normal and equal bilaterally without edema. No aortic or femoral bruits. Chest: symmetric with normal excursions and percussion. Breasts: Symmetric, without lumps, nipple discharge, retractions, or fibrocystic changes.  Abdomen: Flat, soft with bowel sounds active. Nontender, no guarding, rebound, hernias, masses, or organomegaly.  Lymphatics: Non tender without lymphadenopathy.  Genitourinary:  Musculoskeletal: Full ROM all peripheral extremities, joint stability, 5/5 strength, and normal gait. Skin: Warm and dry without rashes, lesions, cyanosis, clubbing or  ecchymosis.  Neuro: Cranial nerves intact, reflexes equal bilaterally. Normal  muscle tone, no cerebellar symptoms. Sensation intact.  Pysch: Alert and oriented X 3, normal affect, Insight and Judgment appropriate.   Assessment and Plan   1. Labile hypertension  - EKG 12-Lead - Urinalysis, Routine w reflex microscopic - Microalbumin / creatinine urine ratio - COMPLETE METABOLIC PANEL WITH GFR - Magnesium - TSH  2. Hyperlipidemia, mixed  - EKG 12-Lead - Lipid panel - TSH  3. Glucose intolerance  - EKG 12-Lead - Hemoglobin A1c - Insulin, random  4. Vitamin D deficiency  - VITAMIN D 25 Hydroxy  5. Carcinoma of left breast metastatic to multiple sites ([place])   6. Aortic atherosclerosis (HCC)  - EKG 12-Lead - Lipid panel  7. Screening for colorectal cancer  - POC Hemoccult Bld/Stl   8. Screening for ischemic heart disease  - EKG 12-Lead  9. FHx: heart disease  - EKG 12-Lead  10. Former smoker  - EKG 12-Lead  11. Medication management  - Urinalysis, Routine w reflex microscopic - Microalbumin / creatinine urine ratio - COMPLETE METABOLIC PANEL WITH GFR - Magnesium - Lipid panel - TSH - Hemoglobin A1c - Insulin, random - VITAMIN D 25 Hydroxy         Patient was counseled in prudent diet to achieve/maintain BMI less than 25 for weight control, BP monitoring, regular exercise and medications. Discussed med's effects and SE's. Screening labs and tests as requested with regular follow-up as recommended. Over 40 minutes of exam, counseling, chart review and high complex critical decision making was performed.   Kirtland Bouchard, MD

## 2020-03-05 ENCOUNTER — Telehealth (HOSPITAL_COMMUNITY): Payer: Self-pay | Admitting: Oncology

## 2020-03-05 ENCOUNTER — Ambulatory Visit (INDEPENDENT_AMBULATORY_CARE_PROVIDER_SITE_OTHER): Payer: Medicare Other | Admitting: Internal Medicine

## 2020-03-05 ENCOUNTER — Other Ambulatory Visit: Payer: Self-pay

## 2020-03-05 ENCOUNTER — Encounter: Payer: Self-pay | Admitting: Internal Medicine

## 2020-03-05 ENCOUNTER — Telehealth: Payer: Self-pay | Admitting: Oncology

## 2020-03-05 VITALS — BP 116/80 | HR 63 | Temp 97.2°F | Resp 16 | Ht 61.5 in | Wt 120.6 lb

## 2020-03-05 DIAGNOSIS — E782 Mixed hyperlipidemia: Secondary | ICD-10-CM

## 2020-03-05 DIAGNOSIS — I700 Atherosclerosis of aorta: Secondary | ICD-10-CM | POA: Diagnosis not present

## 2020-03-05 DIAGNOSIS — E559 Vitamin D deficiency, unspecified: Secondary | ICD-10-CM | POA: Diagnosis not present

## 2020-03-05 DIAGNOSIS — Z79899 Other long term (current) drug therapy: Secondary | ICD-10-CM | POA: Diagnosis not present

## 2020-03-05 DIAGNOSIS — R0989 Other specified symptoms and signs involving the circulatory and respiratory systems: Secondary | ICD-10-CM

## 2020-03-05 DIAGNOSIS — E7439 Other disorders of intestinal carbohydrate absorption: Secondary | ICD-10-CM

## 2020-03-05 DIAGNOSIS — Z1211 Encounter for screening for malignant neoplasm of colon: Secondary | ICD-10-CM

## 2020-03-05 DIAGNOSIS — Z136 Encounter for screening for cardiovascular disorders: Secondary | ICD-10-CM

## 2020-03-05 DIAGNOSIS — C50912 Malignant neoplasm of unspecified site of left female breast: Secondary | ICD-10-CM

## 2020-03-05 DIAGNOSIS — Z8249 Family history of ischemic heart disease and other diseases of the circulatory system: Secondary | ICD-10-CM

## 2020-03-05 DIAGNOSIS — Z87891 Personal history of nicotine dependence: Secondary | ICD-10-CM | POA: Diagnosis not present

## 2020-03-05 NOTE — Telephone Encounter (Signed)
Scheduled per 10/7 los. Called and spoke with pt, confirmed 11/3 appts

## 2020-03-05 NOTE — Telephone Encounter (Signed)
I called patient to move up her echocardiogram that you wanted her to have to an earlier appointment and she declined to schedule. Patient was very adamant that she did not want these test due to it was not told to her that she had to get them. She will discuss with you she states and for me to cancel the testing.  Thank you.

## 2020-03-06 ENCOUNTER — Other Ambulatory Visit: Payer: Self-pay | Admitting: Oncology

## 2020-03-06 ENCOUNTER — Other Ambulatory Visit: Payer: Self-pay

## 2020-03-06 DIAGNOSIS — C50412 Malignant neoplasm of upper-outer quadrant of left female breast: Secondary | ICD-10-CM

## 2020-03-06 LAB — MICROALBUMIN / CREATININE URINE RATIO
Creatinine, Urine: 25 mg/dL (ref 20–275)
Microalb Creat Ratio: 20 mcg/mg creat (ref <30)
Microalb, Ur: 0.5 mg/dL

## 2020-03-06 LAB — COMPLETE METABOLIC PANEL WITH GFR
AG Ratio: 1.5 (calc) (ref 1.0–2.5)
ALT: 16 U/L (ref 6–29)
AST: 18 U/L (ref 10–35)
Albumin: 3.9 g/dL (ref 3.6–5.1)
Alkaline phosphatase (APISO): 52 U/L (ref 37–153)
BUN: 14 mg/dL (ref 7–25)
CO2: 31 mmol/L (ref 20–32)
Calcium: 10.6 mg/dL — ABNORMAL HIGH (ref 8.6–10.4)
Chloride: 103 mmol/L (ref 98–110)
Creat: 0.66 mg/dL (ref 0.60–0.88)
GFR, Est African American: 97 mL/min/{1.73_m2} (ref >=60)
GFR, Est Non African American: 83 mL/min/{1.73_m2} (ref >=60)
Globulin: 2.6 g/dL (calc) (ref 1.9–3.7)
Glucose, Bld: 88 mg/dL (ref 65–99)
Potassium: 4.1 mmol/L (ref 3.5–5.3)
Sodium: 143 mmol/L (ref 135–146)
Total Bilirubin: 0.5 mg/dL (ref 0.2–1.2)
Total Protein: 6.5 g/dL (ref 6.1–8.1)

## 2020-03-06 LAB — URINALYSIS, ROUTINE W REFLEX MICROSCOPIC
Bacteria, UA: NONE SEEN /HPF
Bilirubin Urine: NEGATIVE
Glucose, UA: NEGATIVE
Hgb urine dipstick: NEGATIVE
Hyaline Cast: NONE SEEN /LPF
Ketones, ur: NEGATIVE
Nitrite: NEGATIVE
Protein, ur: NEGATIVE
RBC / HPF: NONE SEEN /HPF (ref 0–2)
Specific Gravity, Urine: 1.006 (ref 1.001–1.03)
Squamous Epithelial / HPF: NONE SEEN /HPF (ref <=5)
pH: 7 (ref 5.0–8.0)

## 2020-03-06 LAB — VITAMIN D 25 HYDROXY (VIT D DEFICIENCY, FRACTURES): Vit D, 25-Hydroxy: 76 ng/mL (ref 30–100)

## 2020-03-06 LAB — LIPID PANEL
Cholesterol: 164 mg/dL (ref <200)
HDL: 50 mg/dL (ref >=50)
LDL Cholesterol (Calc): 92 mg/dL (calc)
Non-HDL Cholesterol (Calc): 114 mg/dL (calc) (ref <130)
Total CHOL/HDL Ratio: 3.3 (calc) (ref <5.0)
Triglycerides: 121 mg/dL (ref <150)

## 2020-03-06 LAB — HEMOGLOBIN A1C
Hgb A1c MFr Bld: 5.7 % of total Hgb — ABNORMAL HIGH (ref <5.7)
Mean Plasma Glucose: 117 (calc)
eAG (mmol/L): 6.5 (calc)

## 2020-03-06 LAB — MAGNESIUM: Magnesium: 2 mg/dL (ref 1.5–2.5)

## 2020-03-06 LAB — INSULIN, RANDOM: Insulin: 13.4 u[IU]/mL

## 2020-03-06 LAB — TSH: TSH: 1.69 mIU/L (ref 0.40–4.50)

## 2020-03-06 NOTE — Progress Notes (Signed)
This LPN spoke with pt who voices great concern about having CT abd, stating she does not feel safe drinking contrast. Pt was educated on importance and reasoning for CT, however she still refused. Dr Jana Hakim prefers abd [place] if pt will not comply with CT. Pt is in agreement with abd [place].   Also, pt cancelled echocardiogram as she did not feel it was important. Pt was also educated on importance of echo and was r/s for this. Pt understands she will have echo 03/12/20 at [place] and abd [place] 03/12/20 at 0915. Process for these tests have been thoroughly explained to pt as she did not understand need, or exposure to radiation with these tests.   Pt understands and agrees to these tests.

## 2020-03-07 ENCOUNTER — Encounter: Payer: Self-pay | Admitting: *Deleted

## 2020-03-12 ENCOUNTER — Ambulatory Visit (HOSPITAL_COMMUNITY)
Admission: RE | Admit: 2020-03-12 | Discharge: 2020-03-12 | Disposition: A | Discharge status: Home / Self Care | Payer: Medicare Other | Source: Ambulatory Visit | Attending: Oncology | Admitting: Oncology

## 2020-03-12 ENCOUNTER — Ambulatory Visit (HOSPITAL_BASED_OUTPATIENT_CLINIC_OR_DEPARTMENT_OTHER)
Admission: RE | Admit: 2020-03-12 | Discharge: 2020-03-12 | Disposition: A | Discharge status: Home / Self Care | Payer: Medicare Other | Source: Ambulatory Visit | Attending: Oncology | Admitting: Oncology

## 2020-03-12 ENCOUNTER — Other Ambulatory Visit: Payer: Self-pay

## 2020-03-12 DIAGNOSIS — Z0189 Encounter for other specified special examinations: Secondary | ICD-10-CM | POA: Diagnosis not present

## 2020-03-12 DIAGNOSIS — Z170 Estrogen receptor positive status [ER+]: Secondary | ICD-10-CM | POA: Insufficient documentation

## 2020-03-12 DIAGNOSIS — Z01818 Encounter for other preprocedural examination: Secondary | ICD-10-CM | POA: Insufficient documentation

## 2020-03-12 DIAGNOSIS — C7951 Secondary malignant neoplasm of bone: Secondary | ICD-10-CM | POA: Insufficient documentation

## 2020-03-12 DIAGNOSIS — M818 Other osteoporosis without current pathological fracture: Secondary | ICD-10-CM | POA: Insufficient documentation

## 2020-03-12 DIAGNOSIS — C787 Secondary malignant neoplasm of liver and intrahepatic bile duct: Secondary | ICD-10-CM | POA: Diagnosis not present

## 2020-03-12 DIAGNOSIS — Z87891 Personal history of nicotine dependence: Secondary | ICD-10-CM | POA: Insufficient documentation

## 2020-03-12 DIAGNOSIS — C801 Malignant (primary) neoplasm, unspecified: Secondary | ICD-10-CM | POA: Diagnosis not present

## 2020-03-12 DIAGNOSIS — C50919 Malignant neoplasm of unspecified site of unspecified female breast: Secondary | ICD-10-CM

## 2020-03-12 DIAGNOSIS — C50412 Malignant neoplasm of upper-outer quadrant of left female breast: Secondary | ICD-10-CM | POA: Diagnosis not present

## 2020-03-12 DIAGNOSIS — I371 Nonrheumatic pulmonary valve insufficiency: Secondary | ICD-10-CM

## 2020-03-12 LAB — ECHOCARDIOGRAM COMPLETE
Area-P 1/2: 4.31 cm2
S' Lateral: 1.9 cm

## 2020-03-12 NOTE — Progress Notes (Signed)
  Echocardiogram 2D Echocardiogram has been performed.  April Rosales 03/12/2020, 8:30 AM

## 2020-03-15 ENCOUNTER — Other Ambulatory Visit (HOSPITAL_COMMUNITY): Payer: Medicare Other

## 2020-03-16 ENCOUNTER — Other Ambulatory Visit: Payer: Self-pay | Admitting: Oncology

## 2020-03-16 ENCOUNTER — Telehealth: Payer: Self-pay

## 2020-03-16 NOTE — Telephone Encounter (Signed)
Pt called inquiring about whether or not she would need ibrance refilled, and is looking for Dr [name] adivce on this. Also states she would like results on echo and abd [place]. Per Dr Jana Hakim, pt should continue on Ibrance as directed; pt verbalized understanding. This LPN called [facility] to let them know she would need it. Dr Jana Hakim has been provided results of [place] and echo to review. Pt understands we will call her back.

## 2020-03-19 ENCOUNTER — Other Ambulatory Visit (HOSPITAL_COMMUNITY): Payer: Medicare Other

## 2020-03-19 DIAGNOSIS — M545 Low back pain, unspecified: Secondary | ICD-10-CM | POA: Diagnosis not present

## 2020-03-23 ENCOUNTER — Other Ambulatory Visit: Payer: Self-pay | Admitting: Internal Medicine

## 2020-03-23 DIAGNOSIS — S32000S Wedge compression fracture of unspecified lumbar vertebra, sequela: Secondary | ICD-10-CM

## 2020-03-23 DIAGNOSIS — S22000S Wedge compression fracture of unspecified thoracic vertebra, sequela: Secondary | ICD-10-CM

## 2020-03-23 MED ORDER — NAPROXEN 500 MG PO TABS: ORAL_TABLET | ORAL | 0 refills | Status: DC

## 2020-03-23 MED ORDER — METHOCARBAMOL 500 MG PO TABS: ORAL_TABLET | ORAL | 0 refills | Status: DC

## 2020-03-23 MED FILL — IBRANCE 75 MG TABS: 75 | 28 days supply | Qty: 21 | Fill #3

## 2020-03-27 ENCOUNTER — Ambulatory Visit (INDEPENDENT_AMBULATORY_CARE_PROVIDER_SITE_OTHER): Payer: Medicare Other | Admitting: Orthopaedic Surgery

## 2020-03-27 ENCOUNTER — Encounter: Payer: Self-pay | Admitting: Orthopaedic Surgery

## 2020-03-27 ENCOUNTER — Other Ambulatory Visit: Payer: Self-pay

## 2020-03-27 ENCOUNTER — Ambulatory Visit (INDEPENDENT_AMBULATORY_CARE_PROVIDER_SITE_OTHER): Payer: Medicare Other

## 2020-03-27 VITALS — BP 153/86 | Ht 61.0 in | Wt 120.0 lb

## 2020-03-27 DIAGNOSIS — M545 Low back pain, unspecified: Secondary | ICD-10-CM

## 2020-03-27 MED ORDER — TRAMADOL HCL 50 MG PO TABS: 50.0000 mg | ORAL_TABLET | Freq: Two times a day (BID) | ORAL | 0 refills | Status: DC | PRN

## 2020-03-27 NOTE — Progress Notes (Signed)
Office Visit Note   Patient: April Rosales           Date of Birth: 1940-10-01           MRN: [id number] Visit Date: 03/27/2020              Requested by: [name], [address] [place],  [place] [id number] PCP: [name], MD   Assessment & Plan: Visit Diagnoses:  1. Acute left-sided low back pain, unspecified whether sciatica present     Plan: We will proceed with MRI scan lumbar spine for evaluate her for acute superior endplate fractures.  Office follow-up after scan for review.  She may want to reconsider biphosphonate treatment since she is back on estrogen blocker with the onset of the likely liver met.  Follow-up after MRI scan.  Follow-Up Instructions: No follow-ups on file.   Orders:  Orders Placed This Encounter  Procedures  . XR Lumbar Spine 2-3 Views   Meds ordered this encounter  Medications  . traMADol (ULTRAM) 50 MG tablet    Sig: Take 1 tablet (50 mg total) by mouth every 12 (twelve) hours as needed.    Dispense:  30 tablet    Refill:  0      Procedures: No procedures performed   Clinical Data: No additional findings.   Subjective: Chief Complaint  Patient presents with  . Lower Back - Pain    HPI 79 year old female here with her sister with previous history of metastatic disease to the upper thoracic spine treated with decompression T1-T2-T3 and fixation C7-T5 by Dr. [name] is retired.  Patient states she recently had increased pain in her lumbar spine she is used Tylenol and ibuprofen which helps somewhat.  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  Event with known diagnosis of osteoporosis.  Patient had a PET scan October 31, 2019 which showed liver lesion consistent with breast CA but no other evidence of metastatic disease.  After thoracic surgery patient had undergone a mastectomy.  She been on estrogen blocker treat and she does have osteoporosis.  Her sister is with her mention that  she did not want to try Prolia.  She does better if she leans over some when she walks.  She states she had a sudden episode that it felt like her foot was coming out of her shoe but it actually did not.  She is not noticed any residual weakness in the right foot or ankle and has had good motor.  She does have numbness in her toes 2 through 5 but not on the dorsum of the foot lateral to medial or plantar surface of the foot just toes.  Review of Systems All other systems are noncontributory as they pertain to HPI.   Objective: Vital Signs: BP (!) 153/86   Ht '5\' 1"'  (1.549 m)   Wt 120 lb (54.4 kg)   BMI 22.67 kg/m   Physical Exam Constitutional:      Appearance: She is well-developed.  HENT:     Head: Normocephalic.     Right Ear: External ear normal.     Left Ear: External ear normal.  Eyes:     Pupils: Pupils are equal, round, and reactive to light.  Neck:     Thyroid: No thyromegaly.     Trachea: No tracheal deviation.  Cardiovascular:     Rate and Rhythm: Normal rate.  Pulmonary:     Effort:  Pulmonary effort is normal.  Abdominal:     Palpations: Abdomen is soft.  Skin:    General: Skin is warm and dry.  Neurological:     Mental Status: She is alert and oriented to person, place, and time.  Psychiatric:        Behavior: Behavior normal.     Ortho Exam negative straight leg raising 90 degrees negative logroll to the hips.  Reflexes are intact anterior tib EHL toe flexion extension peroneals anterior to posterior tib are all strong.  Sensation foot is intact except for toes 2 through 5 just the toes.  Good capillary refill. Specialty Comments:  No specialty comments available.  Imaging: XR Lumbar Spine 2-3 Views  Result Date: 03/27/2020 AP lateral lumbar spine x-rays suggest osteopenia.  There is curvature of the lumbar spine apex on the right at L2-3 approximately 30 degrees.  Lateral images suggest a superior endplate fractures at L1 and L2. Impression: Lumbar  curvature.  No lytic or blastic lesions that would suggest metastatic disease.  Concern for superior endplate fractures.    PMFS History: Patient Active Problem List   Diagnosis Date Noted  . Closed compression fracture of body of L1 vertebra ([place]) 10/04/2019  . Aortic atherosclerosis ([place]) 09/26/2019  . Neuropathy 03/27/2016  . Malignant neoplasm of upper-outer quadrant of left breast in female, estrogen receptor positive ([place]) 09/11/2015  . BMI 22.0-22.9, adult 03/14/2015  . Metastasis to bone ([place]) 10/20/2014  . Breast cancer metastasized to multiple sites ([place]) 08/28/2014  . 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 Medication management 07/07/2013  . Labile hypertension   . Hyperlipidemia   . Other abnormal glucose   . Vitamin D deficiency   . Osteoporosis   . IBS (irritable bowel syndrome)    Past Medical History:  Diagnosis Date  . Arthritis   . Breast cancer [facility]) 1985/2016   takes [name] daily  . Chronic back pain    stenosis/listhesis  . Family history of ovarian cancer   . Osteoporosis    takes Vit D  . Radiation 11/23/14-12/11/14   30 Gy T1-T5     Family History  Problem Relation Age of Onset  . Heart disease Mother   . Hypertension Mother   . Heart disease Father   . Diabetes Father   . Breast cancer Paternal Aunt        4 paternal aunts with breast cancer over 76  . Prostate cancer Paternal Uncle   . Stroke Paternal Grandfather   . Ovarian cancer Paternal Aunt   . Huntington's disease Other        Nephew, inherited from his father    Past Surgical History:  Procedure Laterality Date  . ABDOMINAL HYSTERECTOMY     1980  . APPENDECTOMY  1977  . APPLICATION OF INTRAOPERATIVE CT SCAN N/A 10/20/2014   Procedure: APPLICATION OF INTRAOPERATIVE CAT SCAN;  Surgeon: [name], MD;  Location: [place] [facility];  Service: Neurosurgery;  Laterality: N/A;  . BREAST LUMPECTOMY WITH RADIOACTIVE SEED LOCALIZATION Left  05/03/2018   Procedure: LEFT BREAST LUMPECTOMY WITH BRACKETED RADIOACTIVE SEED LOCALIZATION;  Surgeon: [name], MD;  Location: [facility];  Service: General;  Laterality: Left;  . BREAST SURGERY Right 1985  . THYROID CYST EXCISION  1967   Social History   Occupational History  . Not on file  Tobacco Use  . Smoking status: Former Smoker    Packs/day: 0.50    Years:  10.00    Pack years: 5.00    Types: Cigarettes    Quit date: 05/03/1970    Years since quitting: 49.9  . Smokeless tobacco: Former Systems developer  . Tobacco comment: quit smoking 1970's  Substance and Sexual Activity  . Alcohol use: Yes    Alcohol/week: 0.0 standard drinks    Comment: Rare  . Drug use: No  . Sexual activity: Not on file

## 2020-03-27 NOTE — Addendum Note (Signed)
Addended by: Meyer Cory on: 03/27/2020 04:54 PM   Modules accepted: Orders

## 2020-03-28 ENCOUNTER — Other Ambulatory Visit: Payer: Medicare Other

## 2020-03-28 ENCOUNTER — Ambulatory Visit: Payer: Medicare Other

## 2020-03-28 ENCOUNTER — Other Ambulatory Visit: Payer: Self-pay

## 2020-03-28 ENCOUNTER — Telehealth: Payer: Self-pay | Admitting: Oncology

## 2020-03-28 ENCOUNTER — Inpatient Hospital Stay: Payer: Medicare Other

## 2020-03-28 ENCOUNTER — Inpatient Hospital Stay: Payer: Medicare Other | Attending: Oncology | Admitting: Oncology

## 2020-03-28 VITALS — BP 143/87 | HR 100 | Temp 97.4°F | Resp 18 | Ht 61.0 in | Wt 122.4 lb

## 2020-03-28 DIAGNOSIS — C7951 Secondary malignant neoplasm of bone: Secondary | ICD-10-CM | POA: Diagnosis not present

## 2020-03-28 DIAGNOSIS — Z170 Estrogen receptor positive status [ER+]: Secondary | ICD-10-CM | POA: Diagnosis not present

## 2020-03-28 DIAGNOSIS — C50412 Malignant neoplasm of upper-outer quadrant of left female breast: Secondary | ICD-10-CM

## 2020-03-28 DIAGNOSIS — C50919 Malignant neoplasm of unspecified site of unspecified female breast: Secondary | ICD-10-CM

## 2020-03-28 DIAGNOSIS — Z5111 Encounter for antineoplastic chemotherapy: Secondary | ICD-10-CM | POA: Diagnosis not present

## 2020-03-28 DIAGNOSIS — M810 Age-related osteoporosis without current pathological fracture: Secondary | ICD-10-CM

## 2020-03-28 LAB — CBC WITH DIFFERENTIAL/PLATELET
Abs Immature Granulocytes: 0.01 10*3/uL (ref 0.00–0.07)
Basophils Absolute: 0.1 10*3/uL (ref 0.0–0.1)
Basophils Relative: 2 %
Eosinophils Absolute: 0.1 10*3/uL (ref 0.0–0.5)
Eosinophils Relative: 1 %
HCT: 39.9 % (ref 36.0–46.0)
Hemoglobin: 13.4 g/dL (ref 12.0–15.0)
Immature Granulocytes: 0 %
Lymphocytes Relative: 18 %
Lymphs Abs: 0.8 10*3/uL (ref 0.7–4.0)
MCH: 33.3 pg (ref 26.0–34.0)
MCHC: 33.6 g/dL (ref 30.0–36.0)
MCV: 99.3 fL (ref 80.0–100.0)
Monocytes Absolute: 0.6 10*3/uL (ref 0.1–1.0)
Monocytes Relative: 13 %
Neutro Abs: 2.8 10*3/uL (ref 1.7–7.7)
Neutrophils Relative %: 66 %
Platelets: 299 10*3/uL (ref 150–400)
RBC: 4.02 MIL/uL (ref 3.87–5.11)
RDW: 17.6 % — ABNORMAL HIGH (ref 11.5–15.5)
WBC: 4.3 10*3/uL (ref 4.0–10.5)
nRBC: 0 % (ref 0.0–0.2)

## 2020-03-28 LAB — COMPREHENSIVE METABOLIC PANEL
ALT: 26 U/L (ref 0–44)
AST: 32 U/L (ref 15–41)
Albumin: 3.5 g/dL (ref 3.5–5.0)
Alkaline Phosphatase: 62 U/L (ref 38–126)
Anion gap: 5 (ref 5–15)
BUN: 12 mg/dL (ref 8–23)
CO2: 29 mmol/L (ref 22–32)
Calcium: 10.2 mg/dL (ref 8.9–10.3)
Chloride: 104 mmol/L (ref 98–111)
Creatinine, Ser: 0.71 mg/dL (ref 0.44–1.00)
GFR, Estimated: >60 mL/min (ref >60)
Glucose, Bld: 101 mg/dL — ABNORMAL HIGH (ref 70–99)
Potassium: 4.8 mmol/L (ref 3.5–5.1)
Sodium: 138 mmol/L (ref 135–145)
Total Bilirubin: 0.3 mg/dL (ref 0.3–1.2)
Total Protein: 6.8 g/dL (ref 6.5–8.1)

## 2020-03-28 MED ORDER — DENOSUMAB 60 MG/ML ~~LOC~~ SOSY
60.0000 mg | PREFILLED_SYRINGE | Freq: Once | SUBCUTANEOUS | Status: AC
  Administered 2020-03-28: 60 mg via SUBCUTANEOUS

## 2020-03-28 MED ORDER — DENOSUMAB 60 MG/ML ~~LOC~~ SOSY
PREFILLED_SYRINGE | SUBCUTANEOUS | Status: AC
  Filled 2020-03-28: qty 1

## 2020-03-28 MED ORDER — FULVESTRANT 250 MG/5ML IM SOLN
500.0000 mg | Freq: Once | INTRAMUSCULAR | Status: AC
  Administered 2020-03-28: 500 mg via INTRAMUSCULAR

## 2020-03-28 MED ORDER — FULVESTRANT 250 MG/5ML IM SOLN
INTRAMUSCULAR | Status: AC
  Filled 2020-03-28: qty 10

## 2020-03-28 NOTE — Telephone Encounter (Signed)
Scheduled appointments per 11/3 los. Spoke to patient who is aware of appointment date and time. Gave patient calendar print out.

## 2020-03-28 NOTE — Progress Notes (Signed)
[place]  Telephone:([phone]) [phone] Fax:(336) [phone]    ID: ZURRI RUDDEN DOB: Sep 27, 1940  MR#: [id number]  [facility]#:[id number]  Patient Care Team: [name], MD as PCP - General (Internal Medicine) Garey Alleva, [name], MD as Consulting Physician (Oncology) [name], MD as Consulting Physician (Orthopedic Surgery) [name], MD as Consulting Physician (General Surgery) OTHER: [name], DDS   CHIEF COMPLAINT: Stage IV estrogen receptor positive breast cancer  CURRENT TREATMENT: Fulvestrant, palbociclib; Prolia   INTERVAL HISTORY: Syrenity is here today for follow up of her metastatic breast cancer accompanied by her sister.   Junko continues on Fulvestrant every 4 weeks, together with palbociclib 45m daily 3 weeks on and 1 week off.  She tolerates these treatments remarkably well.  She does not mind the Faslodex injections although occasionally she is a little sore.  She does not have unusual fatigue or nausea from the palbociclib.  Previously she received denosumab, but with the negative bone findings on recent scans we have switched her to Prolia every 6 months basically for osteoporosis.  She will receive a dose today.  Since her last visit, she underwent abdomen ultrasound on 03/12/2020 for follow up of her liver metastases. This showed: two liver masses again identified, one has increased in size and the other has decreased (note there are differences in measurement technique).   REVIEW OF SYSTEMS:  CTeianawas having more back pain and she saw Dr. YLorin Mercyfor evaluation.  He started her on Faslodex and set her up for an MRI which however insurance has not yet approved.  She does feel the Faslodex is helping her pain and it is not constipating her.  Nevertheless she looks uncomfortable and moves with difficulty.  She does not recognize the word kyphoplasty but that is something Dr. YLorin Mercymay bring up or she can proceed to depending on the MRI  findings.  She has a relative who has undergone multiple kyphoplasty's in the past with good results.   COVID 19 VACCINATION STATUS: fully vaccinated (Therapist, music   BREAST CANCER HISTORY: From the original intake note:   [facility]. She tells me she had more than 30 lymph nodes removed from her right axilla and all of them were clear. She received  adjuvant radiation but no systemic treatment.  The patient had recently refused mammography with the last mammogram I can find dating back to August 2010.  More recently the patient presented with left scapular pain radiating down the left arm.  She was evaluated by Dr. YLorin Mercy who obtained a chest x-ray showing a possible abnormality at T3. He then set up the patient for an MRI of the thoracic spine performed 08/16/2014.  This showed multiple compression fractures  (a similar picture had been noted on lumbar MRI 10/09/2011 ). However at T3 they noted tumor in the vertebral body extending into the left pedicle and into the lateral epidural space, displacing the cord to the right. There was no evidence of cord compression or cord signal abnormality. There were no other areas of tumor identified in the thoracic spine.         The patient was then referred to Dr. KHal Neerwho on 08/24/2014 set [name] for CT scans of the chest, abdomen and pelvis. There was a dense mass in the upper inner quadrant of the left breast measuring 1.6 cm. There was a 1.2 cm nodule  in the minor fissure of the right lung and some evidence of right lung fibrosis at the site of the prior radiation port.  There was also a thyroid mass measuring 2 cm. However there were no parenchymal lung or liver lesions. Incidental meningoceles were noted as well as sclerosis of the fifth and sixth ribs which were felt to be likely posttraumatic.   On 08/29/2014 the patient underwent bilateral  diagnostic mammography with tomosynthesis and left breast ultrasonography.  There were postsurgical changes in the upper right breast. In the left breast there was an irregular mass measuring 2.3 cm in the upper inner quadrant. This was palpable. Ultrasound showed this to be hypoechoic and to measure 2.0 cm. There were adjacent areas of nodularity. There was no definite lymphadenopathy in the left axilla.  Biopsy of this breast mass 08/29/2014 showed an invasive adenocarcinoma with both ductal and lobular features (there was strong diffuse E-cadherin expression as well as areas with total absence of E-cadherin expression), with the preliminary prognostic profile showing strong estrogen positivity, very weak to near absent progesterone positivity, an MIB-1 of approximately 40%, and HER-2 equivocal  The patient's subsequent history is as detailed below.   PAST MEDICAL HISTORY: Past Medical History:  Diagnosis Date  . Arthritis   . Breast cancer [facility]) 1985/2016   takes [name] daily  . Chronic back pain    stenosis/listhesis  . Family history of ovarian cancer   . Osteoporosis    takes Vit D  . Radiation 11/23/14-12/11/14   30 Gy T1-T5     PAST SURGICAL HISTORY: Past Surgical History:  Procedure Laterality Date  . ABDOMINAL HYSTERECTOMY     1980  . APPENDECTOMY  1977  . APPLICATION OF INTRAOPERATIVE CT SCAN N/A 10/20/2014   Procedure: APPLICATION OF INTRAOPERATIVE CAT SCAN;  Surgeon: [name], MD;  Location: [facility];  Service: Neurosurgery;  Laterality: N/A;  . BREAST LUMPECTOMY WITH RADIOACTIVE SEED LOCALIZATION Left 05/03/2018   Procedure: LEFT BREAST LUMPECTOMY WITH BRACKETED RADIOACTIVE SEED LOCALIZATION;  Surgeon: [name], MD;  Location: [facility];  Service: General;  Laterality: Left;  . BREAST SURGERY Right 1985  . THYROID CYST EXCISION  1967    FAMILY HISTORY Family History  Problem Relation Age of Onset  . Heart disease Mother   .  Hypertension Mother   . Heart disease Father   . Diabetes Father   . Breast cancer Paternal Aunt        4 paternal aunts with breast cancer over 11  . Prostate cancer Paternal Uncle   . Stroke Paternal Grandfather   . Ovarian cancer Paternal Aunt   . Huntington's disease Other        Nephew, inherited from his father   The patient's father died from a heart attack at the age of 32. He had 9 sisters. 3 of those sisters had breast cancer, all in a menopausal setting. Another sister had ovarian cancer. One of the paternal uncles had cancer of the colon "and back".  The patient's mother died at the age of 68. She was found to have breast cancer shortly before dying , during her final hospitalization.   GYNECOLOGIC HISTORY:  No LMP recorded. Patient has had a hysterectomy.  Menarche age 54, first live birth age 32, the patient is GX P1. She underwent hysterectomy in 1980. She thinks the ovaries were removed, but the CT scan obtained 08/24/2014 showed a definite right ovary. The left ovary may have been removed. She did  not take hormone replacement after the hysterectomy.   SOCIAL HISTORY:   Angelika worked in Psychiatric nurse. She is divorced, lives alone, with 2 cats. Her son Bethann Berkshire lives in [place] where he works in Engineer, technical sales. He has 4 children of his own. The patient is a Psychologist, forensic.   ADVANCED DIRECTIVES:  In place. The patient has named her sister Pleas Koch [phone]) and her friend Janina Mayo [phone]) as joint healthcare powers of attorney   HEALTH MAINTENANCE: Social History   Tobacco Use  . Smoking status: Former Smoker    Packs/day: 0.50    Years: 10.00    Pack years: 5.00    Types: Cigarettes    Quit date: 05/03/1970    Years since quitting: 49.9  . Smokeless tobacco: Former Systems developer  . Tobacco comment: quit smoking 1970's  Substance Use Topics  . Alcohol use: Yes    Alcohol/week: 0.0 standard drinks    Comment: Rare  . Drug use: No      Colonoscopy: never  PAP: status post hysterectomy  Bone density: 09/12/2016 [facility]/ T score of -4.8 osteoporosis  Lipid panel:  Allergies  Allergen Reactions  . Ativan [Lorazepam]     Made her crazy  . Dilaudid [Hydromorphone Hcl]     Doesn't want  . Haldol [Haloperidol Lactate]     Made her crazy    Current Outpatient Medications  Medication Sig Dispense Refill  . Ascorbic Acid (VITAMIN C PO) Take 1 tablet by mouth every other day. Takes qod    . aspirin EC 325 MG tablet Take 1 tablet (325 mg total) by mouth daily. 30 tablet 0  . BIOTIN PO Take 1 tablet by mouth daily.    . Cholecalciferol (VITAMIN D3 ADULT GUMMIES PO) Take 2,000 Units by mouth daily.     . Cyanocobalamin (VITAMIN B-12 PO) Take by mouth daily. Takes every other day    . Iron-Vitamins (GERITOL PO) Take by mouth daily.    . methocarbamol (ROBAXIN) 500 MG tablet Take      1/2 to 1 tablet      3 x /day       for Muscle Spasm & Pain 90 tablet 0  . naproxen (NAPROSYN) 500 MG tablet Take     1 tablet     2 x  /day (every 12 hours)       with Food for Pain & Inflammation 60 tablet 0  . OVER THE COUNTER MEDICATION OTC Apple Cider Vinegar 1 capsule daily.    . palbociclib (IBRANCE) 75 MG tablet Take 1 tablet (75 mg total) by mouth daily. Take for 21 days on, 7 days off, repeat every 28 days. Start 01/04/2020 21 tablet 6  . traMADol (ULTRAM) 50 MG tablet Take 1 tablet (50 mg total) by mouth every 12 (twelve) hours as needed. 30 tablet 0  . triamcinolone ointment (KENALOG) 0.5 % Apply 1 application topically 2 (two) times daily. 30 g 0   No current facility-administered medications for this visit.    OBJECTIVE: White woman who appears anxious and uncomfortable  Vitals:   03/28/20 1146  BP: (!) 143/87  Pulse: 100  Resp: 18  Temp: (!) 97.4 F (36.3 C)  SpO2: 98%   Wt Readings from Last 3 Encounters:  03/28/20 122 lb 6.4 oz (55.5 kg)  03/27/20 120 lb (54.4 kg)  03/05/20 120 lb 9.6 oz (54.7 kg)   Body mass  index is 23.13 kg/m.    ECOG FS:1 - Symptomatic but completely ambulatory  Sclerae unicteric, EOMs intact Wearing a mask No cervical or supraclavicular adenopathy Lungs no rales or rhonchi Heart regular rate and rhythm Abd soft, nontender, positive bowel sounds MSK kyphosis, moderate lumbar spinal tenderness, no upper extremity lymphedema Neuro: nonfocal, well oriented, appropriate affect Breasts: Deferred   LAB RESULTS:  CMP     Component Value Date/Time   NA 138 03/28/2020 1126   NA 141 05/29/2017 1417   K 4.8 03/28/2020 1126   K 3.6 05/29/2017 1417   CL 104 03/28/2020 1126   CO2 29 03/28/2020 1126   CO2 27 05/29/2017 1417   GLUCOSE 101 (H) 03/28/2020 1126   GLUCOSE 111 05/29/2017 1417   BUN 12 03/28/2020 1126   BUN 17.0 05/29/2017 1417   CREATININE 0.71 03/28/2020 1126   CREATININE 0.66 03/05/2020 1218   CREATININE 0.8 05/29/2017 1417   CALCIUM 10.2 03/28/2020 1126   CALCIUM 10.7 (H) 06/26/2017 1216   CALCIUM 10.4 05/29/2017 1417   PROT 6.8 03/28/2020 1126   PROT 7.3 05/29/2017 1417   ALBUMIN 3.5 03/28/2020 1126   ALBUMIN 3.9 05/29/2017 1417   AST 32 03/28/2020 1126   AST 34 12/02/2019 1147   AST 17 05/29/2017 1417   ALT 26 03/28/2020 1126   ALT 30 12/02/2019 1147   ALT 15 05/29/2017 1417   ALKPHOS 62 03/28/2020 1126   ALKPHOS 42 05/29/2017 1417   BILITOT 0.3 03/28/2020 1126   BILITOT 0.4 12/02/2019 1147   BILITOT 0.30 05/29/2017 1417   GFRNONAA >60 03/28/2020 1126   GFRNONAA 83 03/05/2020 1218   GFRAA 97 03/05/2020 1218    INo results found for: SPEP, UPEP  Lab Results  Component Value Date   WBC 4.3 03/28/2020   NEUTROABS 2.8 03/28/2020   HGB 13.4 03/28/2020   HCT 39.9 03/28/2020   MCV 99.3 03/28/2020   PLT 299 03/28/2020      Chemistry      Component Value Date/Time   NA 138 03/28/2020 1126   NA 141 05/29/2017 1417   K 4.8 03/28/2020 1126   K 3.6 05/29/2017 1417   CL 104 03/28/2020 1126   CO2 29 03/28/2020 1126   CO2 27 05/29/2017  1417   BUN 12 03/28/2020 1126   BUN 17.0 05/29/2017 1417   CREATININE 0.71 03/28/2020 1126   CREATININE 0.66 03/05/2020 1218   CREATININE 0.8 05/29/2017 1417      Component Value Date/Time   CALCIUM 10.2 03/28/2020 1126   CALCIUM 10.7 (H) 06/26/2017 1216   CALCIUM 10.4 05/29/2017 1417   ALKPHOS 62 03/28/2020 1126   ALKPHOS 42 05/29/2017 1417   AST 32 03/28/2020 1126   AST 34 12/02/2019 1147   AST 17 05/29/2017 1417   ALT 26 03/28/2020 1126   ALT 30 12/02/2019 1147   ALT 15 05/29/2017 1417   BILITOT 0.3 03/28/2020 1126   BILITOT 0.4 12/02/2019 1147   BILITOT 0.30 05/29/2017 1417       Lab Results  Component Value Date   LABCA2 24 01/01/2016    No components found for: [id number]  No results for input(s): INR in the last 168 hours.  Urinalysis    Component Value Date/Time   COLORURINE YELLOW 03/05/2020 [place] 03/05/2020 1218   LABSPEC 1.006 03/05/2020 [place] 7.0 03/05/2020 [place] 03/05/2020 [place] 03/05/2020 [place] 09/19/2015 [place] 03/05/2020 [place] 03/05/2020 1218   NITRITE NEGATIVE 03/05/2020  [place] 2+ (A) 03/05/2020 1218    STUDIES: US Abdomen Complete  Result Date: 03/12/2020 CLINICAL DATA:  Liver metastasis EXAM: ABDOMEN ULTRASOUND COMPLETE COMPARISON:  June 2021 FINDINGS: Gallbladder: Multiple gallstones are again identified with the largest measuring 1.2 cm. No wall thickening visualized. No sonographic Murphy sign noted by sonographer. Common bile duct: Diameter: 3 mm, normal Liver: Increased parenchymal echogenicity. Two solid lesions of the right lobe again identified. Larger measures 5.6 x 5.1 x 5.6 cm (previously 4.4 x 2.8 x 3.4 cm). Second mass measures 1.5 x 1 x 1.7 cm (previously 2.1 x 1.6 x 1.9 cm). Portal vein is patent on color Doppler imaging with normal direction of blood flow towards the liver. IVC: No abnormality  visualized. Pancreas: Poorly visualized. Spleen: Size and appearance within normal limits. Right Kidney: Length: 10.7 cm. Echogenicity within normal limits. No mass or hydronephrosis visualized. Left Kidney: Length: 10.3 cm. Echogenicity within normal limits. No mass or hydronephrosis visualized. Abdominal aorta: No aneurysm visualized. Other findings: None. IMPRESSION: Two liver masses again identified. One has increased in size and the other has decreased in size, noting that there are differences in measurement technique. Cholelithiasis. Electronically Signed   By: Macy Mis M.D.   On: 03/12/2020 16:12   ECHOCARDIOGRAM COMPLETE  Result Date: 03/12/2020    ECHOCARDIOGRAM REPORT   Patient Name:   RONIN CRAGER Date of Exam: 03/12/2020 Medical Rec #:  [id number]        Height:       61.5 in Accession #:    [id number]       Weight:       120.6 lb Date of Birth:  03/27/1941         BSA:          1.533 m Patient Age:    4 years         BP:           116/80 mmHg Patient Gender: F                HR:           63 bpm. Exam Location:  Outpatient Procedure: 2D Echo, Cardiac Doppler and Color Doppler Indications:    Chemo evaluation  History:        Patient has no prior history of Echocardiogram examinations.                 Risk Factors:Former Smoker. Reccurence metastatic breast cancer,                 radiation therapy.  Sonographer:    Dustin Flock Referring Phys: [phone] Kendra Grissett C Caydence Koenig  Sonographer Comments: Suboptimal apical window and Technically difficult study due to poor echo windows. Scarring from previous breast surgery. IMPRESSIONS  1. Left ventricular ejection fraction, by estimation, is 60 to 65%. The left ventricle has normal function. The left ventricle has no regional wall motion abnormalities. Left ventricular diastolic parameters are consistent with Grade I diastolic dysfunction (impaired relaxation).  2. Right ventricular systolic function is normal. The right ventricular size is normal.  There is normal pulmonary artery systolic pressure.  3. The mitral valve is grossly normal. No evidence of mitral valve regurgitation.  4. The aortic valve is tricuspid. Aortic valve regurgitation is not visualized. Comparison(s): No prior Echocardiogram. FINDINGS  Left Ventricle: Left ventricular ejection fraction, by estimation, is 60 to 65%. The left ventricle has normal function. The left ventricle has no regional wall motion abnormalities.  The left ventricular internal cavity size was small. There is no left ventricular hypertrophy. Left ventricular diastolic parameters are consistent with Grade I diastolic dysfunction (impaired relaxation). Right Ventricle: The right ventricular size is normal. Right vetricular wall thickness was not well visualized. Right ventricular systolic function is normal. There is normal pulmonary artery systolic pressure. The tricuspid regurgitant velocity is 2.10 m/s, and with an assumed right atrial pressure of 3 mmHg, the estimated right ventricular systolic pressure is 27.0 mmHg. Left Atrium: Left atrial size was normal in size. Right Atrium: Right atrial size was normal in size. Pericardium: There is no evidence of pericardial effusion. Mitral Valve: The mitral valve is grossly normal. No evidence of mitral valve regurgitation. Tricuspid Valve: The tricuspid valve is grossly normal. Tricuspid valve regurgitation is trivial. Aortic Valve: The aortic valve is tricuspid. Aortic valve regurgitation is not visualized. Pulmonic Valve: Likely mild based on Color Doppler flow. PHT does not correlate with Color Doppler. The pulmonic valve was not well visualized. Pulmonic valve regurgitation is mild. Aorta: The aortic root is normal in size and structure, the aortic arch was not well visualized and the ascending aorta was not well visualized. Venous: The pulmonary veins were not well visualized. The inferior vena cava was not well visualized. IAS/Shunts: The interatrial septum was not  assessed.  LEFT VENTRICLE PLAX 2D LVIDd:         3.00 cm  Diastology LVIDs:         1.90 cm  LV e' medial:    4.90 cm/s LV PW:         1.10 cm  LV E/e' medial:  12.2 LV IVS:        1.00 cm  LV e' lateral:   5.87 cm/s LVOT diam:     1.90 cm  LV E/e' lateral: 10.2 LV SV:         31 LV SV Index:   20 LVOT Area:     2.84 cm  RIGHT VENTRICLE RV Basal diam:  2.70 cm TAPSE (M-mode): 2.0 cm LEFT ATRIUM             Index       RIGHT ATRIUM           Index LA diam:        2.20 cm 1.44 cm/m  RA Area:     13.30 cm LA Vol (A2C):   26.4 ml 17.23 ml/m RA Volume:   34.80 ml  22.71 ml/m LA Vol (A4C):   20.4 ml 13.31 ml/m LA Biplane Vol: 23.6 ml 15.40 ml/m  AORTIC VALVE LVOT Vmax:   55.00 cm/s LVOT Vmean:  30.800 cm/s LVOT VTI:    0.110 m  AORTA Ao Root diam: 2.50 cm MITRAL VALVE               TRICUSPID VALVE MV Area (PHT): 4.31 cm    TR Peak grad:   17.6 mmHg MV Decel Time: 176 msec    TR Vmax:        210.00 cm/s MV E velocity: 59.60 cm/s MV A velocity: 90.80 cm/s  SHUNTS MV E/A ratio:  0.66        Systemic VTI:  0.11 m                            Systemic Diam: 1.90 cm Rudean Haskell MD Electronically signed by Rudean Haskell MD Signature Date/Time: 03/12/2020/3:41:22 PM    Final  XR Lumbar Spine 2-3 Views  Result Date: 03/27/2020 AP lateral lumbar spine x-rays suggest osteopenia.  There is curvature of the lumbar spine apex on the right at L2-3 approximately 30 degrees.  Lateral images suggest a superior endplate fractures at L1 and L2. Impression: Lumbar curvature.  No lytic or blastic lesions that would suggest metastatic disease.  Concern for superior endplate fractures.    ASSESSMENT: 79 y.o. [place] woman with stage IV left breast cancer involving bone  (1) status post right lumpectomy and axillary node dissection in 1985 followed by radiation at [facility] 08/16/2014: measurable disease in spine, lung and left breast   (2) evaluation for left shoulder pain led to thoracic  spine MRI 08/16/2014 showing a pathologic fracture at T3 with epidural tumor displacing the cord to the right, but no cord compression. CT scans of the chest, abdomen and pelvis 08/24/2014 showed in addition a mass in the upper outer quadrant left breast measuring 1.6 cm and a nodule in the minor fissure of the right lung measuring 1.2 cm, but no parenchymal lung or liver lesions.   (a) CA 27-29 was noninformative at 38 (09/20/2014)    (3) mammography and ultrasonography 08/29/2014 show a mass in the upper inner left breast which was palpable,  measuring 2.0 cm by ultrasound. Biopsy of this mass 08/29/2014 showed an invasive breast cancer with both lobular and ductal features, estrogen receptor positive, progesterone receptor weakly positive, with an MIB-1 in the 40% range, HER-2 equivocal (6 else ratio 1.5, but average number her nucleus 5.8)   (4) letrozole started 08/31/2014;   (a) palbociclib added Sept 2016 at 75 mg 21/7, with significant neutropenia; not repeated after first cycle  (b) letrozole discontinued 05/29/2017 with evidence of disease progression in the breast   (5)  zolendronate started 09/20/2014, stopped after initial dose due to poor tolerance  (a) denosumab/ Xgeva started 01/31/2015, repeated every 4 weeks  (b) changed to every 8 weeks as of August 2018.   (c) every 12 weeks recommended and ordered to start 01/2020   (6) on 10/20/2014 the patient underwent T2-T3 and T4 decompressive laminectomy with removal of epidural tumor, C7-T4 segmental pedicle screw instrumentation with virage screw system with arrow guidance protocol and C7-T4 posterolateral fusion. The cells were positive for the estrogen receptor. HER-2/neu testing by [facility] showed again equivocal results,  (a) most recent bone scan 10/01/2016 finds no new lesions only postoperative changes  (7) radiation 11/23/2014-12/11/2014.  (a) T1-T5 was treated to 30 Gy in 12 fractions at 2.5 Gy per fraction   (8) initiated close  follow-up while considering eventual left lumpectomy or mastectomy depending on  longer-term results of systemic therapy  (a) most recent left breast ultrasonography 10/30/2016 found this mass to measure 0.7 cm  (b) repeat left breast ultrasonography 05/13/2017 showed the upper outer quadrant mass to have grown to 1.4 cm  (c) left breast ultrasound 09/08/2017 shows the mass to now measure 0.9 cm  (d) left breast biopsy x2 on 12/11/2017 shows high-grade ductal carcinoma in situ, essentially estrogen receptor negative  (9) genetics testing using the Breast/Ovarian Cancer Panel through [facility] [name], MD) found no deleterious mutations in ATM, BARD1, BRCA1, BRCA2, BRIP1, CDH1, CHEK2, EPCAM, FANCC, MLH1, MSH2, MSH6, NBN, PALB2, PMS2, PTEN, RAD51C, RAD51D, STK11, TP53, or XRCC2    (10) right thyroid nodule is a complex cyst as noted on CT scan of the neck 01/29/2016  (11) osteoporosis: Bone density at [facility] 09/12/2016 showed a T score of -4.8.  (  a) switched to Prolia starting 03/29/2020  (12) fulvestrant started 05/29/2017, last dose 07/22/2018 (discontinued due to pandemic).  (a) started palbociclib 06/29/2017 at 75 mg every other day for 21 days on, 7 days off  (b) palbociclib discontinued on 3/29 due to progressive fatigue and patient preference  (13)  was to start abemaciclib 02/04/2018, but patient opted for going back to palbociclib at a lower dose beginning in November 19, patient subsequently declined s palbociclib, and  proceeded to surgery  (14) status post left lumpectomy 05/03/2018 showing a pT1b pNX invasive ductal carcinoma, grade 2, with equivocal HER-2 results  (a) opted against adjuvant radiation given presence of stage IV disease  (15) tamoxifen supposed to have been started started 09/13/2018 as a "bridge" pending resumption of fulvestrant/denosumab, but never started by the patient  (16) PET scan 11/02/2018 showed no active disease  (a) cerianna scan on 10/31/2019 shows  activity in 2 liver lesions, no active bone or lung lesions  (b) abdominal ultrasound 11/08/2019 confirms 2 liver lesions measuring 4.4 and 2.1 cm.  (c) biopsy of 1 of the liver lesions 12/12/2019 confirmed metastatic breast cancer, estrogen and progesterone receptor positive, with an MIB-1 of 10%, and HER-2 positive, with a copy number of 2.12 and the number per cell 6.25  (d) abdominal ultrasound 03/12/2020 shows one of the liver lesions to have increased, while the second lesion has decreased  (17) letrozole started 02/15/2019, discontinued 12/02/2019 with evidence of progression  (a) bone density 09/12/2016 showed a T score of -4.8.  (18) fulvestrant resumed 12/08/2019  (a) palbociclib added at 75 mg daily 21/7 starting 12/20/2019   (19) to start trastuzumab 04/25/2020  (a) echo 03/12/2020 shows an ejection fraction in the 60-65% range   PLAN: Kaelah is now 5-1/2 years out from definitive diagnosis of metastatic breast cancer.  We are getting mixed responses on the follow-up liver ultrasound.  For now we are continuing the current treatment as she is also having significant symptoms due to a lumbar vertebral compression fracture and she has been scheduled for MRI of that area under Dr. [name] 04/18/2020.  I am going to see her in 4 weeks when she comes for her next fulvestrant dose.  At that time we will consider adding trastuzumab, noting her excellent baseline echo results.  She may already have undergone or may be set up at some point to undergo kyphoplasty depending on the MRI findings  They know to call for any other issue that may develop before the next visit.  Total encounter time 30 minutes.Sarajane Jews C. Sheril Hammond, MD 03/28/20 12:09 PM Medical Oncology and Hematology [facility] [place], [place] [address] Tel. [phone]    Fax. [phone]   I, Wilburn Mylar, am acting as scribe for Dr. [name]. Bejamin Hackbart.  I, Lurline Del MD, have  reviewed the above documentation for accuracy and completeness, and I agree with the above.   *Total Encounter Time as defined by the Centers for Medicare and Medicaid Services includes, in addition to the face-to-face time of a patient visit (documented in the note above) non-face-to-face time: obtaining and reviewing outside history, ordering and reviewing medications, tests or procedures, care coordination (communications with other health care professionals or caregivers) and documentation in the medical record.

## 2020-03-28 NOTE — Patient Instructions (Signed)
Denosumab injection What is this medicine? DENOSUMAB (den oh sue mab) slows bone breakdown. Prolia is used to treat osteoporosis in women after menopause and in men, and in people who are taking corticosteroids for 6 months or more. Xgeva is used to treat a high calcium level due to cancer and to prevent bone fractures and other bone problems caused by multiple myeloma or cancer bone metastases. Xgeva is also used to treat giant cell tumor of the bone. This medicine may be used for other purposes; ask your health care provider or pharmacist if you have questions. COMMON BRAND NAME(S): Prolia, XGEVA What should I tell my health care provider before I take this medicine? They need to know if you have any of these conditions:  dental disease  having surgery or tooth extraction  infection  kidney disease  low levels of calcium or Vitamin D in the blood  malnutrition  on hemodialysis  skin conditions or sensitivity  thyroid or parathyroid disease  an unusual reaction to denosumab, other medicines, foods, dyes, or preservatives  pregnant or trying to get pregnant  breast-feeding How should I use this medicine? This medicine is for injection under the skin. It is given by a health care professional in a hospital or clinic setting. A special MedGuide will be given to you before each treatment. Be sure to read this information carefully each time. For Prolia, talk to your pediatrician regarding the use of this medicine in children. Special care may be needed. For Xgeva, talk to your pediatrician regarding the use of this medicine in children. While this drug may be prescribed for children as young as 13 years for selected conditions, precautions do apply. Overdosage: If you think you have taken too much of this medicine contact a poison control center or emergency room at once. NOTE: This medicine is only for you. Do not share this medicine with others. What if I miss a dose? It is  important not to miss your dose. Call your doctor or health care professional if you are unable to keep an appointment. What may interact with this medicine? Do not take this medicine with any of the following medications:  other medicines containing denosumab This medicine may also interact with the following medications:  medicines that lower your chance of fighting infection  steroid medicines like prednisone or cortisone This list may not describe all possible interactions. Give your health care provider a list of all the medicines, herbs, non-prescription drugs, or dietary supplements you use. Also tell them if you smoke, drink alcohol, or use illegal drugs. Some items may interact with your medicine. What should I watch for while using this medicine? Visit your doctor or health care professional for regular checks on your progress. Your doctor or health care professional may order blood tests and other tests to see how you are doing. Call your doctor or health care professional for advice if you get a fever, chills or sore throat, or other symptoms of a cold or flu. Do not treat yourself. This drug may decrease your body's ability to fight infection. Try to avoid being around people who are sick. You should make sure you get enough calcium and vitamin D while you are taking this medicine, unless your doctor tells you not to. Discuss the foods you eat and the vitamins you take with your health care professional. See your dentist regularly. Brush and floss your teeth as directed. Before you have any dental work done, tell your dentist you are   receiving this medicine. Do not become pregnant while taking this medicine or for 5 months after stopping it. Talk with your doctor or health care professional about your birth control options while taking this medicine. Women should inform their doctor if they wish to become pregnant or think they might be pregnant. There is a potential for serious side  effects to an unborn child. Talk to your health care professional or pharmacist for more information. What side effects may I notice from receiving this medicine? Side effects that you should report to your doctor or health care professional as soon as possible:  allergic reactions like skin rash, itching or hives, swelling of the face, lips, or tongue  bone pain  breathing problems  dizziness  jaw pain, especially after dental work  redness, blistering, peeling of the skin  signs and symptoms of infection like fever or chills; cough; sore throat; pain or trouble passing urine  signs of low calcium like fast heartbeat, muscle cramps or muscle pain; pain, tingling, numbness in the hands or feet; seizures  unusual bleeding or bruising  unusually weak or tired Side effects that usually do not require medical attention (report to your doctor or health care professional if they continue or are bothersome):  constipation  diarrhea  headache  joint pain  loss of appetite  muscle pain  runny nose  tiredness  upset stomach This list may not describe all possible side effects. Call your doctor for medical advice about side effects. You may report side effects to FDA at 1-800-FDA-1088. Where should I keep my medicine? This medicine is only given in a clinic, doctor's office, or other health care setting and will not be stored at home. NOTE: This sheet is a summary. It may not cover all possible information. If you have questions about this medicine, talk to your doctor, pharmacist, or health care provider.  2020 Elsevier/Gold Standard (2017-09-18 16:10:44) Fulvestrant injection What is this medicine? FULVESTRANT (ful VES trant) blocks the effects of estrogen. It is used to treat breast cancer. This medicine may be used for other purposes; ask your health care provider or pharmacist if you have questions. COMMON BRAND NAME(S): FASLODEX What should I tell my health care  provider before I take this medicine? They need to know if you have any of these conditions:  bleeding disorders  liver disease  low blood counts, like low white cell, platelet, or red cell counts  an unusual or allergic reaction to fulvestrant, other medicines, foods, dyes, or preservatives  pregnant or trying to get pregnant  breast-feeding How should I use this medicine? This medicine is for injection into a muscle. It is usually given by a health care professional in a hospital or clinic setting. Talk to your pediatrician regarding the use of this medicine in children. Special care may be needed. Overdosage: If you think you have taken too much of this medicine contact a poison control center or emergency room at once. NOTE: This medicine is only for you. Do not share this medicine with others. What if I miss a dose? It is important not to miss your dose. Call your doctor or health care professional if you are unable to keep an appointment. What may interact with this medicine?  medicines that treat or prevent blood clots like warfarin, enoxaparin, dalteparin, apixaban, dabigatran, and rivaroxaban This list may not describe all possible interactions. Give your health care provider a list of all the medicines, herbs, non-prescription drugs, or dietary supplements you use.   Also tell them if you smoke, drink alcohol, or use illegal drugs. Some items may interact with your medicine. What should I watch for while using this medicine? Your condition will be monitored carefully while you are receiving this medicine. You will need important blood work done while you are taking this medicine. Do not become pregnant while taking this medicine or for at least 1 year after stopping it. Women of child-bearing potential will need to have a negative pregnancy test before starting this medicine. Women should inform their doctor if they wish to become pregnant or think they might be pregnant. There is  a potential for serious side effects to an unborn child. Men should inform their doctors if they wish to father a child. This medicine may lower sperm counts. Talk to your health care professional or pharmacist for more information. Do not breast-feed an infant while taking this medicine or for 1 year after the last dose. What side effects may I notice from receiving this medicine? Side effects that you should report to your doctor or health care professional as soon as possible:  allergic reactions like skin rash, itching or hives, swelling of the face, lips, or tongue  feeling faint or lightheaded, falls  pain, tingling, numbness, or weakness in the legs  signs and symptoms of infection like fever or chills; cough; flu-like symptoms; sore throat  vaginal bleeding Side effects that usually do not require medical attention (report to your doctor or health care professional if they continue or are bothersome):  aches, pains  constipation  diarrhea  headache  hot flashes  nausea, vomiting  pain at site where injected  stomach pain This list may not describe all possible side effects. Call your doctor for medical advice about side effects. You may report side effects to FDA at 1-800-FDA-1088. Where should I keep my medicine? This drug is given in a hospital or clinic and will not be stored at home. NOTE: This sheet is a summary. It may not cover all possible information. If you have questions about this medicine, talk to your doctor, pharmacist, or health care provider.  2020 Elsevier/Gold Standard (2017-08-20 11:34:41)  

## 2020-03-30 ENCOUNTER — Telehealth: Payer: Self-pay | Admitting: Orthopaedic Surgery

## 2020-03-30 NOTE — Telephone Encounter (Signed)
Patient's OP note is in system from previous spine surgery in 2016.  OK for MRI?

## 2020-03-30 NOTE — Telephone Encounter (Signed)
Ucall. She is fine for MRI. Has titanium instrumentation in upper thoracic and lower c spine. She is fine for MRI thanks.

## 2020-03-30 NOTE — Telephone Encounter (Signed)
Pt called stating she has an MRI coming up on 04/18/20 and they asked her if she had any metal in her body but she wasn't sure as she had a surgery on her neck in 2016. So the pt called [facility] where she had the surgery and they told her they would fax over some medical records to Dr.Yates so he can determine wether or not there is metal in there. Pt would like a CB after these records have been received and reviewed so she knows if she can keep her MRI  [phone]

## 2020-03-30 NOTE — Telephone Encounter (Signed)
I called patient to advise. No answer, no voicemail. Will try again.

## 2020-04-02 NOTE — Telephone Encounter (Signed)
I called patient and advised. 

## 2020-04-09 ENCOUNTER — Telehealth: Payer: Self-pay

## 2020-04-09 NOTE — Telephone Encounter (Signed)
Please advise. Thanks.  

## 2020-04-09 NOTE — Telephone Encounter (Signed)
Ok thank you 

## 2020-04-09 NOTE — Telephone Encounter (Signed)
Patient called she is requesting rx refill for tramadol. call back:[phone]

## 2020-04-10 ENCOUNTER — Telehealth: Payer: Self-pay | Admitting: Orthopaedic Surgery

## 2020-04-10 MED ORDER — TRAMADOL HCL 50 MG PO TABS: 50.0000 mg | ORAL_TABLET | Freq: Two times a day (BID) | ORAL | 0 refills | Status: DC | PRN

## 2020-04-10 NOTE — Addendum Note (Signed)
Addended byLaurann Montana on: 04/10/2020 09:12 AM   Modules accepted: Orders

## 2020-04-10 NOTE — Telephone Encounter (Signed)
Called to pharmacy. Tried calling patient to advise done. No answer.

## 2020-04-10 NOTE — Telephone Encounter (Signed)
Duplicate message in chart.  

## 2020-04-10 NOTE — Telephone Encounter (Signed)
I called patient and advised. 

## 2020-04-10 NOTE — Telephone Encounter (Signed)
Pt called requesting a refill of tramadol yesterday but then never heard anything; pt would like to be notified when that is called in.  [phone]

## 2020-04-17 MED FILL — IBRANCE 75 MG TABS: 75 | 28 days supply | Qty: 21 | Fill #4

## 2020-04-18 ENCOUNTER — Telehealth: Payer: Self-pay

## 2020-04-18 ENCOUNTER — Ambulatory Visit
Admission: RE | Admit: 2020-04-18 | Discharge: 2020-04-18 | Disposition: A | Discharge status: Home / Self Care | Payer: Medicare Other | Source: Ambulatory Visit | Attending: Orthopaedic Surgery | Admitting: Orthopaedic Surgery

## 2020-04-18 DIAGNOSIS — M545 Low back pain, unspecified: Secondary | ICD-10-CM

## 2020-04-18 MED ORDER — DIAZEPAM 5 MG PO TABS: ORAL_TABLET | ORAL | 0 refills | Status: DC

## 2020-04-18 NOTE — Telephone Encounter (Signed)
Patient called in saying she has a mri today , said she panicked and couldn't do it , wants to know about what other options to do . Says she is claustrophobic

## 2020-04-18 NOTE — Telephone Encounter (Signed)
[place] for valium?

## 2020-04-18 NOTE — Telephone Encounter (Signed)
Called patient and discussed. She is going to try valium and call [facility] back to reschedule. I went over instructions for taking valium prior to procedure. Patient is aware she must have driver.  Valium called to pharmacy.

## 2020-04-18 NOTE — Progress Notes (Signed)
Pharmacist Chemotherapy Monitoring - Initial Assessment    Anticipated start date: 04/25/20   Regimen:  . Are orders appropriate based on the patient's diagnosis, regimen, and cycle? Yes . Does the plan date match the patient's scheduled date? Yes . Is the sequencing of drugs appropriate? Yes . Are the premedications appropriate for the patient's regimen? Yes . Prior Authorization for treatment is: Pending o If applicable, is the correct biosimilar selected based on the patient's insurance? not applicable  Organ Function and Labs: Marland Kitchen Are dose adjustments needed based on the patient's renal function, hepatic function, or hematologic function? No . Are appropriate labs ordered prior to the start of patient's treatment? Yes . Other organ system assessment, if indicated: trastuzumab: Echo/ MUGA . The following baseline labs, if indicated, have been ordered: N/A  Dose Assessment: . Are the drug doses appropriate? Yes . Are the following correct: o Drug concentrations Yes o IV fluid compatible with drug Yes o Administration routes Yes o Timing of therapy Yes . If applicable, does the patient have documented access for treatment and/or plans for port-a-cath placement? not applicable . If applicable, have lifetime cumulative doses been properly documented and assessed? not applicable Lifetime Dose Tracking  No doses have been documented on this patient for the following tracked chemicals: Doxorubicin, Epirubicin, Idarubicin, Daunorubicin, Mitoxantrone, Bleomycin, Oxaliplatin, Carboplatin, Liposomal Doxorubicin  o   Toxicity Monitoring/Prevention: . The patient has the following take home antiemetics prescribed: Ondansetron and Prochlorperazine . The patient has the following take home medications prescribed: N/A . Medication allergies and previous infusion related reactions, if applicable, have been reviewed and addressed. Yes . The patient's current medication list has been assessed for  drug-drug interactions with their chemotherapy regimen. no significant drug-drug interactions were identified on review.  Order Review: . Are the treatment plan orders signed? No . Is the patient scheduled to see a provider prior to their treatment? Yes  I verify that I have reviewed each item in the above checklist and answered each question accordingly.  April Rosales 04/18/2020 11:26 AM

## 2020-04-18 NOTE — Telephone Encounter (Signed)
Yes OK , may go open scanner if needed . Thank you

## 2020-04-24 ENCOUNTER — Ambulatory Visit: Payer: Medicare Other | Admitting: Orthopaedic Surgery

## 2020-04-24 NOTE — Progress Notes (Signed)
[place]  [phone] Fax:([phone]) [phone]    ID: April Rosales DOB: Apr 18, 1941  MR#: [id number]  OIZ#:[id number]  Patient Care Team: [name], MD as PCP - General (Internal Medicine) Devlynn Knoff, [name], MD as Consulting Physician (Oncology) [name], MD as Consulting Physician (Orthopedic Surgery) [name], MD as Consulting Physician (General Surgery) OTHER: [name], DDS   CHIEF COMPLAINT: Stage IV estrogen receptor positive breast cancer  CURRENT TREATMENT: Fulvestrant, palbociclib; denosumab/Prolia; trastuzumab/herceptin to 4 weeks   INTERVAL HISTORY: April Rosales is here today for follow up of her metastatic breast cancer accompanied by her sister [name].   Rajean continues on Fulvestrant every 4 weeks, together with palbociclib 91m daily 3 weeks on and 1 week off.  She tolerates these treatments remarkably well.  She does not mind the Faslodex injections although occasionally she is a little sore.  She does not have unusual fatigue or nausea from the palbociclib.  Her counts are holding up at the current dose  We have switched her to denosumab/Prolia every 6 months for osteoporosis.  She started this on 03/28/2020.  Next dose will be due in May.  She is scheduled to start Herceptin today.  She had lumbar spine x-rays 03/27/2020 which show osteopenia and some scoliosis.  There are endplate fractures at L1 and L2.  There are no lytic or blastic lesions suggestive of metastatic disease.  She is scheduled for lumbar spine MRI 05/12/2020   REVIEW OF SYSTEMS:  CZaryacontinues to have back problems as her main source of discomfort.  She is not exercising regularly but does a little bit of walking 2 or 3 times a day she says; mostly she goes to the driveway.  She has had no unusual headaches visual changes nausea vomiting cough phlegm production pleurisy or shortness of breath.  A detailed review of systems today was otherwise  stable.   COVID 19 VACCINATION STATUS: fully vaccinated (Visual merchandiser   BREAST CANCER HISTORY: From the original intake note:   [facility]. She tells me she had more than 30 lymph nodes removed from her right axilla and all of them were clear. She received  adjuvant radiation but no systemic treatment.  The patient had recently refused mammography with the last mammogram I can find dating back to August 2010.  More recently the patient presented with left scapular pain radiating down the left arm.  She was evaluated by Dr. YLorin Mercy who obtained a chest x-ray showing a possible abnormality at T3. He then set up the patient for an MRI of the thoracic spine performed 08/16/2014.  This showed multiple compression fractures  (a similar picture had been noted on lumbar MRI 10/09/2011 ). However at T3 they noted tumor in the vertebral body extending into the left pedicle and into the lateral epidural space, displacing the cord to the right. There was no evidence of cord compression or cord signal abnormality. There were no other areas of tumor identified in the thoracic spine.         The patient was then referred to Dr. KHal Neerwho on 08/24/2014 set [name] for CT scans of the chest, abdomen and pelvis. There was a dense mass in the upper inner quadrant of the left breast measuring 1.6 cm. There was a 1.2 cm nodule in the minor fissure of the right lung and some evidence of right lung fibrosis  at the site of the prior radiation port.  There was also a thyroid mass measuring 2 cm. However there were no parenchymal lung or liver lesions. Incidental meningoceles were noted as well as sclerosis of the fifth and sixth ribs which were felt to be likely posttraumatic.   On 08/29/2014 the patient underwent bilateral diagnostic mammography with tomosynthesis and left breast ultrasonography.  There were postsurgical  changes in the upper right breast. In the left breast there was an irregular mass measuring 2.3 cm in the upper inner quadrant. This was palpable. Ultrasound showed this to be hypoechoic and to measure 2.0 cm. There were adjacent areas of nodularity. There was no definite lymphadenopathy in the left axilla.  Biopsy of this breast mass 08/29/2014 showed an invasive adenocarcinoma with both ductal and lobular features (there was strong diffuse E-cadherin expression as well as areas with total absence of E-cadherin expression), with the preliminary prognostic profile showing strong estrogen positivity, very weak to near absent progesterone positivity, an MIB-1 of approximately 40%, and HER-2 equivocal  The patient's subsequent history is as detailed below.   PAST MEDICAL HISTORY: Past Medical History:  Diagnosis Date  . Arthritis   . Breast cancer [facility]) 1985/2016   takes [name] daily  . Chronic back pain    stenosis/listhesis  . Family history of ovarian cancer   . Osteoporosis    takes Vit D  . Radiation 11/23/14-12/11/14   30 Gy T1-T5     PAST SURGICAL HISTORY: Past Surgical History:  Procedure Laterality Date  . ABDOMINAL HYSTERECTOMY     1980  . APPENDECTOMY  1977  . APPLICATION OF INTRAOPERATIVE CT SCAN N/A 10/20/2014   Procedure: APPLICATION OF INTRAOPERATIVE CAT SCAN;  Surgeon: [name], MD;  Location: [place] [facility];  Service: Neurosurgery;  Laterality: N/A;  . BREAST LUMPECTOMY WITH RADIOACTIVE SEED LOCALIZATION Left 05/03/2018   Procedure: LEFT BREAST LUMPECTOMY WITH BRACKETED RADIOACTIVE SEED LOCALIZATION;  Surgeon: [name], MD;  Location: [place];  Service: General;  Laterality: Left;  . BREAST SURGERY Right 1985  . THYROID CYST EXCISION  1967    FAMILY HISTORY Family History  Problem Relation Age of Onset  . Heart disease Mother   . Hypertension Mother   . Heart disease Father   . Diabetes Father   . Breast cancer Paternal Aunt        4  paternal aunts with breast cancer over 88  . Prostate cancer Paternal Uncle   . Stroke Paternal Grandfather   . Ovarian cancer Paternal Aunt   . Huntington's disease Other        Nephew, inherited from his father   The patient's father died from a heart attack at the age of 75. He had 9 sisters. 3 of those sisters had breast cancer, all in a menopausal setting. Another sister had ovarian cancer. One of the paternal uncles had cancer of the colon "and back".  The patient's mother died at the age of 51. She was found to have breast cancer shortly before dying , during her final hospitalization.   GYNECOLOGIC HISTORY:  No LMP recorded. Patient has had a hysterectomy.  Menarche age 83, first live birth age 45, the patient is GX P1. She underwent hysterectomy in 1980. She thinks the ovaries were removed, but the CT scan obtained 08/24/2014 showed a definite right ovary. The left ovary may have been removed. She did not take hormone replacement after the hysterectomy.   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 She is divorced, lives alone, with 2 cats. Her son April Rosales lives in [place] where he works in Engineer, technical sales. He has 4 children of his own. The patient is a Psychologist, forensic.   ADVANCED DIRECTIVES:  In place. The patient has named her sister April Rosales [phone]) and her friend April Rosales [phone]) as joint healthcare powers of attorney   HEALTH MAINTENANCE: Social History   Tobacco Use  . Smoking status: Former Smoker    Packs/day: 0.50    Years: 10.00    Pack years: 5.00    Types: Cigarettes    Quit date: 05/03/1970    Years since quitting: 50.0  . Smokeless tobacco: Former Systems developer  . Tobacco comment: quit smoking 1970's  Substance Use Topics  . Alcohol use: Yes    Alcohol/week: 0.0 standard drinks    Comment: Rare  . Drug use: No     Colonoscopy: never  PAP: status post hysterectomy  Bone density: 09/12/2016 [facility]/ T score of -4.8 osteoporosis  Lipid  panel:  Allergies  Allergen Reactions  . Ativan [Lorazepam]     Made her crazy  . Dilaudid [Hydromorphone Hcl]     Doesn't want  . Haldol [Haloperidol Lactate]     Made her crazy    Current Outpatient Medications  Medication Sig Dispense Refill  . Ascorbic Acid (VITAMIN C PO) Take 1 tablet by mouth every other day. Takes qod    . aspirin EC 325 MG tablet Take 1 tablet (325 mg total) by mouth daily. 30 tablet 0  . BIOTIN PO Take 1 tablet by mouth daily.    . Cholecalciferol (VITAMIN D3 ADULT GUMMIES PO) Take 2,000 Units by mouth daily.     . Cyanocobalamin (VITAMIN B-12 PO) Take by mouth daily. Takes every other day    . diazepam (VALIUM) 5 MG tablet Take as directed prior to procedure. 3 tablet 0  . Iron-Vitamins (GERITOL PO) Take by mouth daily.    . naproxen (NAPROSYN) 500 MG tablet Take     1 tablet     2 x  /day (every 12 hours)       with Food for Pain & Inflammation 60 tablet 0  . OVER THE COUNTER MEDICATION OTC Apple Cider Vinegar 1 capsule daily.    . palbociclib (IBRANCE) 75 MG tablet Take 1 tablet (75 mg total) by mouth daily. Take for 21 days on, 7 days off, repeat every 28 days. Start 01/04/2020 21 tablet 6  . traMADol (ULTRAM) 50 MG tablet Take 1 tablet (50 mg total) by mouth every 12 (twelve) hours as needed. 30 tablet 0  . triamcinolone ointment (KENALOG) 0.5 % Apply 1 application topically 2 (two) times daily. 30 g 0   No current facility-administered medications for this visit.    OBJECTIVE: White woman who appears anxious   Vitals:   04/25/20 1128  BP: 135/75  Resp: 18  Temp: 97.6 F (36.4 C)  SpO2: 97%   Wt Readings from Last 3 Encounters:  04/25/20 120 lb 9.6 oz (54.7 kg)  03/28/20 122 lb 6.4 oz (55.5 kg)  03/27/20 120 lb (54.4 kg)   Body mass index is 22.79 kg/m.    ECOG FS:1 - Symptomatic but completely ambulatory  Sclerae unicteric, EOMs intact Wearing a mask No cervical or supraclavicular adenopathy Lungs no rales or rhonchi Heart regular  rate and rhythm Abd soft, nontender, positive bowel sounds MSK kyphosis but no focal spinal tenderness, no upper extremity lymphedema Neuro: nonfocal, well oriented, appropriate  affect Breasts:    LAB RESULTS:  CMP     Component Value Date/Time   NA 140 04/25/2020 1109   NA 141 05/29/2017 1417   K 4.1 04/25/2020 1109   K 3.6 05/29/2017 1417   CL 107 04/25/2020 1109   CO2 24 04/25/2020 1109   CO2 27 05/29/2017 1417   GLUCOSE 105 (H) 04/25/2020 1109   GLUCOSE 111 05/29/2017 1417   BUN 12 04/25/2020 1109   BUN 17.0 05/29/2017 1417   CREATININE 0.64 04/25/2020 1109   CREATININE 0.66 03/05/2020 1218   CREATININE 0.8 05/29/2017 1417   CALCIUM 10.2 04/25/2020 1109   CALCIUM 10.7 (H) 06/26/2017 1216   CALCIUM 10.4 05/29/2017 1417   PROT 7.1 04/25/2020 1109   PROT 7.3 05/29/2017 1417   ALBUMIN 3.7 04/25/2020 1109   ALBUMIN 3.9 05/29/2017 1417   AST 17 04/25/2020 1109   AST 34 12/02/2019 1147   AST 17 05/29/2017 1417   ALT 11 04/25/2020 1109   ALT 30 12/02/2019 1147   ALT 15 05/29/2017 1417   ALKPHOS 50 04/25/2020 1109   ALKPHOS 42 05/29/2017 1417   BILITOT 0.4 04/25/2020 1109   BILITOT 0.4 12/02/2019 1147   BILITOT 0.30 05/29/2017 1417   GFRNONAA >60 04/25/2020 1109   GFRNONAA 83 03/05/2020 1218   GFRAA 97 03/05/2020 1218    INo results found for: SPEP, UPEP  Lab Results  Component Value Date   WBC 2.6 (L) 04/25/2020   NEUTROABS 1.4 (L) 04/25/2020   HGB 13.5 04/25/2020   HCT 39.3 04/25/2020   MCV 101.6 (H) 04/25/2020   PLT 206 04/25/2020      Chemistry      Component Value Date/Time   NA 140 04/25/2020 1109   NA 141 05/29/2017 1417   K 4.1 04/25/2020 1109   K 3.6 05/29/2017 1417   CL 107 04/25/2020 1109   CO2 24 04/25/2020 1109   CO2 27 05/29/2017 1417   BUN 12 04/25/2020 1109   BUN 17.0 05/29/2017 1417   CREATININE 0.64 04/25/2020 1109   CREATININE 0.66 03/05/2020 1218   CREATININE 0.8 05/29/2017 1417      Component Value Date/Time   CALCIUM 10.2  04/25/2020 1109   CALCIUM 10.7 (H) 06/26/2017 1216   CALCIUM 10.4 05/29/2017 1417   ALKPHOS 50 04/25/2020 1109   ALKPHOS 42 05/29/2017 1417   AST 17 04/25/2020 1109   AST 34 12/02/2019 1147   AST 17 05/29/2017 1417   ALT 11 04/25/2020 1109   ALT 30 12/02/2019 1147   ALT 15 05/29/2017 1417   BILITOT 0.4 04/25/2020 1109   BILITOT 0.4 12/02/2019 1147   BILITOT 0.30 05/29/2017 1417       Lab Results  Component Value Date   LABCA2 24 01/01/2016    No components found for: [id number]  No results for input(s): INR in the last 168 hours.  Urinalysis    Component Value Date/Time   COLORURINE YELLOW 03/05/2020 [place] 03/05/2020 1218   LABSPEC 1.006 03/05/2020 [place] 7.0 03/05/2020 [place] 03/05/2020 [place] 03/05/2020 [place] 09/19/2015 [place] 03/05/2020 [place] 03/05/2020 1218   NITRITE NEGATIVE 03/05/2020 1218   LEUKOCYTESUR 2+ (A) 03/05/2020 1218    STUDIES: XR Lumbar Spine 2-3 Views  Result Date: 03/27/2020 AP lateral lumbar spine x-rays suggest osteopenia.  There is curvature of the lumbar spine apex on the right  at L2-3 approximately 30 degrees.  Lateral images suggest a superior endplate fractures at L1 and L2. Impression: Lumbar curvature.  No lytic or blastic lesions that would suggest metastatic disease.  Concern for superior endplate fractures.    ASSESSMENT: 79 y.o. [place] woman with stage IV left breast cancer involving bone  (1) status post right lumpectomy and axillary node dissection in 1985 followed by radiation at [place] 08/16/2014: measurable disease in spine, lung and left breast   (2) evaluation for left shoulder pain led to thoracic spine MRI 08/16/2014 showing a pathologic fracture at T3 with epidural tumor displacing the cord to the right, but no cord compression. CT scans of the chest, abdomen and pelvis 08/24/2014  showed in addition a mass in the upper outer quadrant left breast measuring 1.6 cm and a nodule in the minor fissure of the right lung measuring 1.2 cm, but no parenchymal lung or liver lesions.   (a) CA 27-29 was noninformative at 38 (09/20/2014)    (3) mammography and ultrasonography 08/29/2014 show a mass in the upper inner left breast which was palpable,  measuring 2.0 cm by ultrasound. Biopsy of this mass 08/29/2014 showed an invasive breast cancer with both lobular and ductal features, estrogen receptor positive, progesterone receptor weakly positive, with an MIB-1 in the 40% range, HER-2 equivocal (6 else ratio 1.5, but average number her nucleus 5.8)   (4) letrozole started 08/31/2014;   (a) palbociclib added Sept 2016 at 75 mg 21/7, with significant neutropenia; not repeated after first cycle  (b) letrozole discontinued 05/29/2017 with evidence of disease progression in the breast   (5)  zolendronate started 09/20/2014, stopped after initial dose due to poor tolerance  (a) denosumab/ Xgeva started 01/31/2015, repeated every 4 weeks  (b) changed to every 8 weeks as of August 2018.   (c) every 12 weeks recommended and ordered to start 01/2020   (6) on 10/20/2014 the patient underwent T2-T3 and T4 decompressive laminectomy with removal of epidural tumor, C7-T4 segmental pedicle screw instrumentation with virage screw system with arrow guidance protocol and C7-T4 posterolateral fusion. The cells were positive for the estrogen receptor. HER-2/neu testing by [facility] showed again equivocal results,  (a) most recent bone scan 10/01/2016 finds no new lesions only postoperative changes  (7) radiation 11/23/2014-12/11/2014.  (a) T1-T5 was treated to 30 Gy in 12 fractions at 2.5 Gy per fraction   (8) initiated close follow-up while considering eventual left lumpectomy or mastectomy depending on  longer-term results of systemic therapy  (a) most recent left breast ultrasonography 10/30/2016 found this  mass to measure 0.7 cm  (b) repeat left breast ultrasonography 05/13/2017 showed the upper outer quadrant mass to have grown to 1.4 cm  (c) left breast ultrasound 09/08/2017 shows the mass to now measure 0.9 cm  (d) left breast biopsy x2 on 12/11/2017 shows high-grade ductal carcinoma in situ, essentially estrogen receptor negative  (9) genetics testing using the Breast/Ovarian Cancer Panel through [facility] [name], MD) found no deleterious mutations in ATM, BARD1, BRCA1, BRCA2, BRIP1, CDH1, CHEK2, EPCAM, FANCC, MLH1, MSH2, MSH6, NBN, PALB2, PMS2, PTEN, RAD51C, RAD51D, STK11, TP53, or XRCC2    (10) right thyroid nodule is a complex cyst as noted on CT scan of the neck 01/29/2016  (11) osteoporosis: [facility] density at [facility] 09/12/2016 showed a T score of -4.8.  (a) switched to Prolia starting 03/29/2020  (12) fulvestrant started 05/29/2017, last dose 07/22/2018 (discontinued due to pandemic).  (a) started palbociclib 06/29/2017 at 75 mg every  other day for 21 days on, 7 days off  (b) palbociclib discontinued on 3/29 due to progressive fatigue and patient preference  (13)  was to start abemaciclib 02/04/2018, but patient opted for going back to palbociclib at a lower dose beginning in November 19, patient subsequently declined s palbociclib, and  proceeded to surgery  (14) status post left lumpectomy 05/03/2018 showing a pT1b pNX invasive ductal carcinoma, grade 2, with equivocal HER-2 results  (a) opted against adjuvant radiation given presence of stage IV disease  (15) tamoxifen supposed to have been started started 09/13/2018 as a "bridge" pending resumption of fulvestrant/denosumab, but never started by the patient  (16) PET scan 11/02/2018 showed no active disease  (a) [place] scan on 10/31/2019 shows activity in 2 liver lesions, no active bone or lung lesions  (b) abdominal ultrasound 11/08/2019 confirms 2 liver lesions measuring 4.4 and 2.1 cm.  (c) biopsy of 1 of the liver lesions  12/12/2019 confirmed metastatic breast cancer, estrogen and progesterone receptor positive, with an MIB-1 of 10%, and HER-2 positive, with a copy number of 2.12 and the number per cell 6.25  (d) abdominal ultrasound 03/12/2020 shows one of the liver lesions to have increased, while the second lesion has decreased  (17) letrozole started 02/15/2019, discontinued 12/02/2019 with evidence of progression  (a) bone density 09/12/2016 showed a T score of -4.8.  (18) fulvestrant resumed 12/08/2019  (a) palbociclib added at 75 mg daily 21/7 starting 12/20/2019   (19) to start trastuzumab 04/25/2020, repeat every 28 days  (a) echo 03/12/2020 shows an ejection fraction in the 60-65% range   PLAN: Nashia is now 5-1/2 years out from definitive diagnosis of metastatic breast cancer.  She has had bone disease which is now not particularly apparent.  She is being evaluated by Ortho (Dr. [name]) for consideration of kyphoplasty.  She skipped her MRI because of anxiety but has been rescheduled and has been prescribed Valium to take before that procedure.  Her measurable disease is in the liver.  It is HER-2 positive.  This was not noted in her prior biopsies.  I think that she really will need anti-HER-2 treatment if we want to control that and this is the liver which is of course a life-threatening metastatic site.  Accordingly she will be starting trastuzumab today.  We discussed the possible toxicities side effects and complications in great detail.  She was exceedingly anxious about starting.  She eventually agreed with the proviso that we do give her some Valium with each treatment which I am glad to do.  This does mean someone will need to drive her home every time but I believe that is usually her pattern in any case  She will need a repeat echocardiogram in February.  We will make sure our cardio oncologists are aware of these treatments.  She will come back in 4 weeks--we are doing the Herceptin every 28  days so they can coincide with her fulvestrant treatments--and will see one of our nurse practitioners at that time since I will be out of town.  Assuming all is well she will be treated again that day and then she will see me again 4 weeks later with her next treatment  Azarah knows to call us if she has any concerns or problems.  Total encounter time 40 minutes.Sarajane Jews C. Linder Prajapati, MD 04/25/20 1:23 PM Medical Oncology and Hematology [facility] [place], [place] [address] Tel. [phone]    Fax.  [phone]   I, Wilburn Mylar, am acting as scribe for Dr. [name]. Zakya Halabi.  I, Lurline Del MD, have reviewed the above documentation for accuracy and completeness, and I agree with the above.   *Total Encounter Time as defined by the Centers for Medicare and Medicaid Services includes, in addition to the face-to-face time of a patient visit (documented in the note above) non-face-to-face time: obtaining and reviewing outside history, ordering and reviewing medications, tests or procedures, care coordination (communications with other health care professionals or caregivers) and documentation in the medical record.

## 2020-04-25 ENCOUNTER — Other Ambulatory Visit: Payer: Self-pay

## 2020-04-25 ENCOUNTER — Inpatient Hospital Stay: Payer: Medicare Other | Attending: Oncology

## 2020-04-25 ENCOUNTER — Inpatient Hospital Stay: Payer: Medicare Other

## 2020-04-25 ENCOUNTER — Inpatient Hospital Stay (HOSPITAL_BASED_OUTPATIENT_CLINIC_OR_DEPARTMENT_OTHER): Payer: Medicare Other | Admitting: Oncology

## 2020-04-25 ENCOUNTER — Telehealth: Payer: Self-pay

## 2020-04-25 ENCOUNTER — Other Ambulatory Visit: Payer: Medicare Other

## 2020-04-25 VITALS — BP 135/75 | Temp 97.6°F | Resp 18 | Ht 61.0 in | Wt 120.6 lb

## 2020-04-25 VITALS — BP 136/83 | HR 92 | Temp 98.0°F | Resp 16

## 2020-04-25 DIAGNOSIS — Z170 Estrogen receptor positive status [ER+]: Secondary | ICD-10-CM

## 2020-04-25 DIAGNOSIS — C50412 Malignant neoplasm of upper-outer quadrant of left female breast: Secondary | ICD-10-CM | POA: Insufficient documentation

## 2020-04-25 DIAGNOSIS — C50919 Malignant neoplasm of unspecified site of unspecified female breast: Secondary | ICD-10-CM

## 2020-04-25 DIAGNOSIS — Z5112 Encounter for antineoplastic immunotherapy: Secondary | ICD-10-CM | POA: Diagnosis not present

## 2020-04-25 DIAGNOSIS — Z5111 Encounter for antineoplastic chemotherapy: Secondary | ICD-10-CM | POA: Insufficient documentation

## 2020-04-25 DIAGNOSIS — C7951 Secondary malignant neoplasm of bone: Secondary | ICD-10-CM

## 2020-04-25 DIAGNOSIS — M818 Other osteoporosis without current pathological fracture: Secondary | ICD-10-CM | POA: Diagnosis not present

## 2020-04-25 DIAGNOSIS — M810 Age-related osteoporosis without current pathological fracture: Secondary | ICD-10-CM

## 2020-04-25 LAB — CBC WITH DIFFERENTIAL/PLATELET
Abs Immature Granulocytes: 0 10*3/uL (ref 0.00–0.07)
Basophils Absolute: 0.1 10*3/uL (ref 0.0–0.1)
Basophils Relative: 2 %
Eosinophils Absolute: 0 10*3/uL (ref 0.0–0.5)
Eosinophils Relative: 1 %
HCT: 39.3 % (ref 36.0–46.0)
Hemoglobin: 13.5 g/dL (ref 12.0–15.0)
Immature Granulocytes: 0 %
Lymphocytes Relative: 23 %
Lymphs Abs: 0.6 10*3/uL — ABNORMAL LOW (ref 0.7–4.0)
MCH: 34.9 pg — ABNORMAL HIGH (ref 26.0–34.0)
MCHC: 34.4 g/dL (ref 30.0–36.0)
MCV: 101.6 fL — ABNORMAL HIGH (ref 80.0–100.0)
Monocytes Absolute: 0.5 10*3/uL (ref 0.1–1.0)
Monocytes Relative: 19 %
Neutro Abs: 1.4 10*3/uL — ABNORMAL LOW (ref 1.7–7.7)
Neutrophils Relative %: 55 %
Platelets: 206 10*3/uL (ref 150–400)
RBC: 3.87 MIL/uL (ref 3.87–5.11)
RDW: 16.2 % — ABNORMAL HIGH (ref 11.5–15.5)
WBC: 2.6 10*3/uL — ABNORMAL LOW (ref 4.0–10.5)
nRBC: 0 % (ref 0.0–0.2)

## 2020-04-25 LAB — COMPREHENSIVE METABOLIC PANEL
ALT: 11 U/L (ref 0–44)
AST: 17 U/L (ref 15–41)
Albumin: 3.7 g/dL (ref 3.5–5.0)
Alkaline Phosphatase: 50 U/L (ref 38–126)
Anion gap: 9 (ref 5–15)
BUN: 12 mg/dL (ref 8–23)
CO2: 24 mmol/L (ref 22–32)
Calcium: 10.2 mg/dL (ref 8.9–10.3)
Chloride: 107 mmol/L (ref 98–111)
Creatinine, Ser: 0.64 mg/dL (ref 0.44–1.00)
GFR, Estimated: >60 mL/min (ref >60)
Glucose, Bld: 105 mg/dL — ABNORMAL HIGH (ref 70–99)
Potassium: 4.1 mmol/L (ref 3.5–5.1)
Sodium: 140 mmol/L (ref 135–145)
Total Bilirubin: 0.4 mg/dL (ref 0.3–1.2)
Total Protein: 7.1 g/dL (ref 6.5–8.1)

## 2020-04-25 MED ORDER — DIAZEPAM 5 MG/ML IJ SOLN: 5.0000 mg | Freq: Once | INTRAMUSCULAR | Status: DC

## 2020-04-25 MED ORDER — DIPHENHYDRAMINE HCL 25 MG PO CAPS
ORAL_CAPSULE | ORAL | Status: AC
  Filled 2020-04-25: qty 1

## 2020-04-25 MED ORDER — ACETAMINOPHEN 325 MG PO TABS
650.0000 mg | ORAL_TABLET | Freq: Once | ORAL | Status: AC
  Administered 2020-04-25: 650 mg via ORAL

## 2020-04-25 MED ORDER — TRAMADOL HCL 50 MG PO TABS: 50.0000 mg | ORAL_TABLET | Freq: Two times a day (BID) | ORAL | 0 refills | Status: DC | PRN

## 2020-04-25 MED ORDER — FULVESTRANT 250 MG/5ML IM SOLN
INTRAMUSCULAR | Status: AC
  Filled 2020-04-25: qty 10

## 2020-04-25 MED ORDER — DIAZEPAM 5 MG PO TABS
5.0000 mg | ORAL_TABLET | Freq: Once | ORAL | Status: DC
  Filled 2020-04-25: qty 1

## 2020-04-25 MED ORDER — TRASTUZUMAB-DKST CHEMO 150 MG IV SOLR
6.0000 mg/kg | Freq: Once | INTRAVENOUS | Status: AC
  Administered 2020-04-25: 336 mg via INTRAVENOUS
  Filled 2020-04-25: qty 16

## 2020-04-25 MED ORDER — ACETAMINOPHEN 325 MG PO TABS
ORAL_TABLET | ORAL | Status: AC
  Filled 2020-04-25: qty 2

## 2020-04-25 MED ORDER — FULVESTRANT 250 MG/5ML IM SOLN
500.0000 mg | Freq: Once | INTRAMUSCULAR | Status: AC
  Administered 2020-04-25: 500 mg via INTRAMUSCULAR

## 2020-04-25 MED ORDER — SODIUM CHLORIDE 0.9 % IV SOLN
Freq: Once | INTRAVENOUS | Status: AC
  Filled 2020-04-25: qty 250

## 2020-04-25 MED ORDER — DIPHENHYDRAMINE HCL 25 MG PO CAPS
25.0000 mg | ORAL_CAPSULE | Freq: Once | ORAL | Status: AC
  Administered 2020-04-25: 25 mg via ORAL

## 2020-04-25 NOTE — Telephone Encounter (Signed)
OK refill thanks 

## 2020-04-25 NOTE — Progress Notes (Signed)
Patient refused ordered Valium.

## 2020-04-25 NOTE — Patient Instructions (Signed)
Fulvestrant injection What is this medicine? FULVESTRANT (ful VES trant) blocks the effects of estrogen. It is used to treat breast cancer. This medicine may be used for other purposes; ask your health care provider or pharmacist if you have questions. COMMON BRAND NAME(S): FASLODEX What should I tell my health care provider before I take this medicine? They need to know if you have any of these conditions:  bleeding disorders  liver disease  low blood counts, like low white cell, platelet, or red cell counts  an unusual or allergic reaction to fulvestrant, other medicines, foods, dyes, or preservatives  pregnant or trying to get pregnant  breast-feeding How should I use this medicine? This medicine is for injection into a muscle. It is usually given by a health care professional in a hospital or clinic setting. Talk to your pediatrician regarding the use of this medicine in children. Special care may be needed. Overdosage: If you think you have taken too much of this medicine contact a poison control center or emergency room at once. NOTE: This medicine is only for you. Do not share this medicine with others. What if I miss a dose? It is important not to miss your dose. Call your doctor or health care professional if you are unable to keep an appointment. What may interact with this medicine?  medicines that treat or prevent blood clots like warfarin, enoxaparin, dalteparin, apixaban, dabigatran, and rivaroxaban This list may not describe all possible interactions. Give your health care provider a list of all the medicines, herbs, non-prescription drugs, or dietary supplements you use. Also tell them if you smoke, drink alcohol, or use illegal drugs. Some items may interact with your medicine. What should I watch for while using this medicine? Your condition will be monitored carefully while you are receiving this medicine. You will need important blood work done while you are taking  this medicine. Do not become pregnant while taking this medicine or for at least 1 year after stopping it. Women of child-bearing potential will need to have a negative pregnancy test before starting this medicine. Women should inform their doctor if they wish to become pregnant or think they might be pregnant. There is a potential for serious side effects to an unborn child. Men should inform their doctors if they wish to father a child. This medicine may lower sperm counts. Talk to your health care professional or pharmacist for more information. Do not breast-feed an infant while taking this medicine or for 1 year after the last dose. What side effects may I notice from receiving this medicine? Side effects that you should report to your doctor or health care professional as soon as possible:  allergic reactions like skin rash, itching or hives, swelling of the face, lips, or tongue  feeling faint or lightheaded, falls  pain, tingling, numbness, or weakness in the legs  signs and symptoms of infection like fever or chills; cough; flu-like symptoms; sore throat  vaginal bleeding Side effects that usually do not require medical attention (report to your doctor or health care professional if they continue or are bothersome):  aches, pains  constipation  diarrhea  headache  hot flashes  nausea, vomiting  pain at site where injected  stomach pain This list may not describe all possible side effects. Call your doctor for medical advice about side effects. You may report side effects to FDA at 1-800-FDA-1088. Where should I keep my medicine? This drug is given in a hospital or clinic and will   not be stored at home. NOTE: This sheet is a summary. It may not cover all possible information. If you have questions about this medicine, talk to your doctor, pharmacist, or health care provider.  2020 Elsevier/Gold Standard (2017-08-20 11:34:41)  

## 2020-04-25 NOTE — Telephone Encounter (Signed)
Please advise 

## 2020-04-25 NOTE — Progress Notes (Signed)
OK to treat today per Dr Jana Hakim despite low counts. No Loading dose.  Pt anxious & Dr Jana Hakim ordered Valium.  Message also relayed to [place].

## 2020-04-25 NOTE — Telephone Encounter (Signed)
Called to pharmacy. I called patient to advise but no answer, no voicemail picked up.

## 2020-04-25 NOTE — Patient Instructions (Signed)
[facility] Discharge Instructions for Patients Receiving Chemotherapy  Today you received the following chemotherapy agents Trastuzumab-dkst (OGIVRI).  To help prevent nausea and vomiting after your treatment, we encourage you to take your nausea medication as prescribed.   If you develop nausea and vomiting that is not controlled by your nausea medication, call the clinic.   BELOW ARE SYMPTOMS THAT SHOULD BE REPORTED IMMEDIATELY:  *FEVER GREATER THAN 100.5 F  *CHILLS WITH OR WITHOUT FEVER  NAUSEA AND VOMITING THAT IS NOT CONTROLLED WITH YOUR NAUSEA MEDICATION  *UNUSUAL SHORTNESS OF BREATH  *UNUSUAL BRUISING OR BLEEDING  TENDERNESS IN MOUTH AND THROAT WITH OR WITHOUT PRESENCE OF ULCERS  *URINARY PROBLEMS  *BOWEL PROBLEMS  UNUSUAL RASH Items with * indicate a potential emergency and should be followed up as soon as possible.  Feel free to call the clinic should you have any questions or concerns. The clinic phone number is [phone].  Please show the [facility] at check-in to the Emergency Department and triage nurse.  Trastuzumab injection for infusion What is this medicine? TRASTUZUMAB (tras TOO zoo mab) is a monoclonal antibody. It is used to treat breast cancer and stomach cancer. This medicine may be used for other purposes; ask your health care provider or pharmacist if you have questions. COMMON BRAND NAME(S): Herceptin, Galvin Proffer, Trazimera What should I tell my health care provider before I take this medicine? They need to know if you have any of these conditions:  heart disease  heart failure  lung or breathing disease, like asthma  an unusual or allergic reaction to trastuzumab, benzyl alcohol, or other medications, foods, dyes, or preservatives  pregnant or trying to get pregnant  breast-feeding How should I use this medicine? This drug is given as an infusion into a vein. It is administered in a  hospital or clinic by a specially trained health care professional. Talk to your pediatrician regarding the use of this medicine in children. This medicine is not approved for use in children. Overdosage: If you think you have taken too much of this medicine contact a poison control center or emergency room at once. NOTE: This medicine is only for you. Do not share this medicine with others. What if I miss a dose? It is important not to miss a dose. Call your doctor or health care professional if you are unable to keep an appointment. What may interact with this medicine? This medicine may interact with the following medications:  certain types of chemotherapy, such as daunorubicin, doxorubicin, epirubicin, and idarubicin This list may not describe all possible interactions. Give your health care provider a list of all the medicines, herbs, non-prescription drugs, or dietary supplements you use. Also tell them if you smoke, drink alcohol, or use illegal drugs. Some items may interact with your medicine. What should I watch for while using this medicine? Visit your doctor for checks on your progress. Report any side effects. Continue your course of treatment even though you feel ill unless your doctor tells you to stop. Call your doctor or health care professional for advice if you get a fever, chills or sore throat, or other symptoms of a cold or flu. Do not treat yourself. Try to avoid being around people who are sick. You may experience fever, chills and shaking during your first infusion. These effects are usually mild and can be treated with other medicines. Report any side effects during the infusion to your health care professional. Fever and  chills usually do not happen with later infusions. Do not become pregnant while taking this medicine or for 7 months after stopping it. Women should inform their doctor if they wish to become pregnant or think they might be pregnant. Women of child-bearing  potential will need to have a negative pregnancy test before starting this medicine. There is a potential for serious side effects to an unborn child. Talk to your health care professional or pharmacist for more information. Do not breast-feed an infant while taking this medicine or for 7 months after stopping it. Women must use effective birth control with this medicine. What side effects may I notice from receiving this medicine? Side effects that you should report to your doctor or health care professional as soon as possible:  allergic reactions like skin rash, itching or hives, swelling of the face, lips, or tongue  chest pain or palpitations  cough  dizziness  feeling faint or lightheaded, falls  fever  general ill feeling or flu-like symptoms  signs of worsening heart failure like breathing problems; swelling in your legs and feet  unusually weak or tired Side effects that usually do not require medical attention (report to your doctor or health care professional if they continue or are bothersome):  bone pain  changes in taste  diarrhea  joint pain  nausea/vomiting  weight loss This list may not describe all possible side effects. Call your doctor for medical advice about side effects. You may report side effects to FDA at 1-800-FDA-1088. Where should I keep my medicine? This drug is given in a hospital or clinic and will not be stored at home. NOTE: This sheet is a summary. It may not cover all possible information. If you have questions about this medicine, talk to your doctor, pharmacist, or health care provider.  2020 Elsevier/Gold Standard (2016-05-06 14:37:52)

## 2020-04-25 NOTE — Addendum Note (Signed)
Addended by: Meyer Cory on: 04/25/2020 12:08 PM   Modules accepted: Orders

## 2020-04-25 NOTE — Telephone Encounter (Signed)
Patient called she is requesting Rx refill for tramadol. [phone]

## 2020-04-26 ENCOUNTER — Other Ambulatory Visit: Payer: Self-pay | Admitting: Oncology

## 2020-04-26 ENCOUNTER — Telehealth: Payer: Self-pay | Admitting: *Deleted

## 2020-04-26 NOTE — Progress Notes (Signed)
April Rosales has terrible venous access.  I am changing her Herceptin to subcu.

## 2020-05-11 ENCOUNTER — Telehealth: Payer: Self-pay | Admitting: Orthopaedic Surgery

## 2020-05-11 MED ORDER — TRAMADOL HCL 50 MG PO TABS: 50.0000 mg | ORAL_TABLET | Freq: Two times a day (BID) | ORAL | 0 refills | Status: DC | PRN

## 2020-05-11 NOTE — Telephone Encounter (Signed)
Pt requesting refill on tramadol. She tried to go one day without and it was too much pain for her. Please call her at [phone] when refilled.

## 2020-05-11 NOTE — Telephone Encounter (Signed)
I called to pharmacy. I called patient and advised. 

## 2020-05-11 NOTE — Telephone Encounter (Signed)
Ok refill thanks 

## 2020-05-11 NOTE — Addendum Note (Signed)
Addended by: Meyer Cory on: 05/11/2020 01:48 PM   Modules accepted: Orders

## 2020-05-11 NOTE — Telephone Encounter (Signed)
Please advise 

## 2020-05-12 ENCOUNTER — Ambulatory Visit
Admission: RE | Admit: 2020-05-12 | Discharge: 2020-05-12 | Disposition: A | Discharge status: Home / Self Care | Payer: Medicare Other | Source: Ambulatory Visit | Attending: Orthopaedic Surgery | Admitting: Orthopaedic Surgery

## 2020-05-12 DIAGNOSIS — M545 Low back pain, unspecified: Secondary | ICD-10-CM | POA: Diagnosis not present

## 2020-05-12 DIAGNOSIS — M48061 Spinal stenosis, lumbar region without neurogenic claudication: Secondary | ICD-10-CM | POA: Diagnosis not present

## 2020-05-15 ENCOUNTER — Ambulatory Visit (INDEPENDENT_AMBULATORY_CARE_PROVIDER_SITE_OTHER): Payer: Medicare Other | Admitting: Orthopaedic Surgery

## 2020-05-15 ENCOUNTER — Encounter: Payer: Self-pay | Admitting: Orthopaedic Surgery

## 2020-05-15 ENCOUNTER — Other Ambulatory Visit: Payer: Self-pay

## 2020-05-15 DIAGNOSIS — S22080D Wedge compression fracture of T11-T12 vertebra, subsequent encounter for fracture with routine healing: Secondary | ICD-10-CM | POA: Diagnosis not present

## 2020-05-15 DIAGNOSIS — S22000A Wedge compression fracture of unspecified thoracic vertebra, initial encounter for closed fracture: Secondary | ICD-10-CM | POA: Insufficient documentation

## 2020-05-15 NOTE — Progress Notes (Signed)
Office Visit Note   Patient: April Rosales           Date of Birth: [birth year]           MRN: [id number] Visit Date: 05/15/2020              Requested by: Lucky Cowboy, MD [address] [place],  [place] [address] PCP: Lucky Cowboy, MD   Assessment & Plan: Visit Diagnoses:  1. Compression fracture of T11 vertebra with routine healing, subsequent encounter     Plan: She just recently got her aunt Ultram refilled.  She can wait until mid January if she wants to wean the medication.  We discussed progressive healing and resolution of pain symptoms which have occurred with her other fractures.  She does not really remember these episodes.  She is on calcium and vitamin D.  She has been on estrogen blockers in the past.  She can discuss with her oncologist whether biphosphonate treatment is indicated or not.  She can follow-up with me on an as-needed basis.  Currently she states as long she is occasionally taking Ultram she is having minimal discomfort which is consistent with progressive healing of the superior endplate fracture at T11.  Follow-Up Instructions: No follow-ups on file.   Orders:  No orders of the defined types were placed in this encounter.  No orders of the defined types were placed in this encounter.     Procedures: No procedures performed   Clinical Data: No additional findings.   Subjective: Chief Complaint  Patient presents with  . Lower Back - Pain, Follow-up    Lumbar MRI review    HPI 79 year old female returns she states she is continue take the tramadol she tried skipping it but states she had some increased discomfort.  New MRI scan has been obtained which shows a superior endplate fracture of T11 which is not present on May 2021 x-rays.  She had increased pain was seen October 25 and this showed no compression at T11 and then follow-up x-rays again on 03/27/2020 in my office showed T11 just at the edge of the image of the  lumbar spine and may have had some abnormality of the superior endplate which suggest the injury occurred at the end of October.  All other previous fractures at T10, T12, L1, L2, L3 and L4 all appear healed and are not acute.  Patient was primarily having pain posteriorly on the left now seems to be more on the right side posteriorly.  Review of Systems all other systems are noncontributory.  Of note is positive breast cancer with metastatic disease to the liver.   Objective: Vital Signs: BP (!) 157/86   Pulse (!) 121   Ht 5\' 1"  (1.549 m)   Wt 120 lb (54.4 kg)   BMI 22.67 kg/m   Physical Exam Constitutional:      Appearance: She is well-developed.  HENT:     Head: Normocephalic.     Right Ear: External ear normal.     Left Ear: External ear normal.  Eyes:     Pupils: Pupils are equal, round, and reactive to light.  Neck:     Thyroid: No thyromegaly.     Trachea: No tracheal deviation.  Cardiovascular:     Rate and Rhythm: Normal rate.  Pulmonary:     Effort: Pulmonary effort is normal.  Abdominal:     Palpations: Abdomen is soft.  Skin:    General: Skin is warm and  dry.  Neurological:     Mental Status: She is alert and oriented to person, place, and time.  Psychiatric:        Mood and Affect: Mood and affect normal.        Behavior: Behavior normal.     Ortho Exam patient is ambulatory with a cane.  No myelopathic changes.  Specialty Comments:  No specialty comments available.  Imaging: CLINICAL DATA:  Worsening back pain since 03/15/2020. History of metastatic breast cancer.  EXAM: MRI LUMBAR SPINE WITHOUT CONTRAST  TECHNIQUE: Multiplanar, multisequence MR imaging of the lumbar spine was performed. No intravenous contrast was administered.  COMPARISON:  Radiography 03/27/2020.  MRI 06/05/2017.  FINDINGS: Segmentation: 5 lumbar type vertebral bodies as numbered previously.  Alignment: Lower thoracic curvature convex to the left. Lumbar curvature  convex to the right.  Vertebrae: Recent superior endplate fracture at S01 with loss of height 10%. No retropulsed bone. No finding to suggest that this is anything other than a benign osteoporotic fracture.  Old minor superior endplate deformity at U93. Old compression fracture at L1 with loss of height of 40%. Old superior and inferior endplate fractures at L2 with maximal loss of height of 40%. Old superior and inferior endplate fractures at L3 with loss of height of 50% centrally. Old minimal superior endplate depression at L4. L5 and the sacrum are negative.  Conus medullaris and cauda equina: Conus extends to the T12-L1 level. Conus and cauda equina appear normal.  Paraspinal and other soft tissues: Negative  Disc levels:  Mild age related disc desiccation and bulging. No disc herniation. No compressive stenosis of the canal or foramina. No significant facet arthropathy.  IMPRESSION: 1. Recent superior endplate fracture at A35 with loss of height of 10%. No retropulsed bone. No finding to suggest that this is anything other than a benign osteoporotic fracture. 2. Old healed compression fractures at T12, L1, L2, L3 and L4. No retropulsed bone or traumatic disc herniation. No compressive stenosis of the canal or foramina.   Electronically Signed   By: Nelson Chimes M.D.   On: 05/13/2020 15:11   PMFS History: Patient Active Problem List   Diagnosis Date Noted  . Thoracic compression fracture ([facility]) 05/15/2020  . Closed compression fracture of body of L1 vertebra ([facility]) 10/04/2019  . Aortic atherosclerosis ([facility]) 09/26/2019  . Neuropathy 03/27/2016  . Malignant neoplasm of upper-outer quadrant of left breast in female, estrogen receptor positive ([facility]) 09/11/2015  . BMI 22.0-22.9, adult 03/14/2015  . Metastasis to bone ([facility]) 10/20/2014  . Breast cancer metastasized to multiple sites ([facility]) 08/28/2014  . 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 Medication management 07/07/2013  . Labile hypertension   . Hyperlipidemia   . Other abnormal glucose   . Vitamin D deficiency   . Osteoporosis   . IBS (irritable bowel syndrome)    Past Medical History:  Diagnosis Date  . Arthritis   . Breast cancer [facility]) 1985/2016   takes [name] daily  . Chronic back pain    stenosis/listhesis  . Family history of ovarian cancer   . Osteoporosis    takes Vit D  . Radiation 11/23/14-12/11/14   30 Gy T1-T5     Family History  Problem Relation Age of Onset  . Heart disease Mother   . Hypertension Mother   . Heart disease Father   . Diabetes Father   . Breast cancer Paternal Aunt  4 paternal aunts with breast cancer over 36  . Prostate cancer Paternal Uncle   . Stroke Paternal Grandfather   . Ovarian cancer Paternal Aunt   . Huntington's disease Other        Nephew, inherited from his father    Past Surgical History:  Procedure Laterality Date  . ABDOMINAL HYSTERECTOMY     1980  . APPENDECTOMY  1977  . APPLICATION OF INTRAOPERATIVE CT SCAN N/A 10/20/2014   Procedure: APPLICATION OF INTRAOPERATIVE CAT SCAN;  Surgeon: [name], MD;  Location: [facility];  Service: Neurosurgery;  Laterality: N/A;  . BREAST LUMPECTOMY WITH RADIOACTIVE SEED LOCALIZATION Left 05/03/2018   Procedure: LEFT BREAST LUMPECTOMY WITH BRACKETED RADIOACTIVE SEED LOCALIZATION;  Surgeon: [name], MD;  Location: [facility];  Service: General;  Laterality: Left;  . BREAST SURGERY Right 1985  . THYROID CYST EXCISION  1967   Social History   Occupational History  . Not on file  Tobacco Use  . Smoking status: Former Smoker    Packs/day: 0.50    Years: 10.00    Pack years: 5.00    Types: Cigarettes    Quit date: 05/03/1970    Years since quitting: 50.0  . Smokeless tobacco: Former Systems developer  . Tobacco comment: quit smoking 1970's  Substance and Sexual Activity  . Alcohol use: Yes    Alcohol/week:  0.0 standard drinks    Comment: Rare  . Drug use: No  . Sexual activity: Not on file

## 2020-05-21 MED FILL — IBRANCE 75 MG TABS: 75 | 28 days supply | Qty: 21 | Fill #5

## 2020-05-22 ENCOUNTER — Encounter: Payer: Self-pay | Admitting: Adult Health

## 2020-05-22 ENCOUNTER — Inpatient Hospital Stay (HOSPITAL_BASED_OUTPATIENT_CLINIC_OR_DEPARTMENT_OTHER): Payer: Medicare Other | Admitting: Adult Health

## 2020-05-22 ENCOUNTER — Other Ambulatory Visit: Payer: Self-pay

## 2020-05-22 ENCOUNTER — Inpatient Hospital Stay: Payer: Medicare Other

## 2020-05-22 VITALS — HR 80 | Resp 16

## 2020-05-22 VITALS — BP 148/83 | HR 110 | Temp 98.0°F | Resp 17 | Ht 61.0 in | Wt 118.4 lb

## 2020-05-22 DIAGNOSIS — C50412 Malignant neoplasm of upper-outer quadrant of left female breast: Secondary | ICD-10-CM

## 2020-05-22 DIAGNOSIS — Z170 Estrogen receptor positive status [ER+]: Secondary | ICD-10-CM

## 2020-05-22 DIAGNOSIS — C50919 Malignant neoplasm of unspecified site of unspecified female breast: Secondary | ICD-10-CM

## 2020-05-22 DIAGNOSIS — Z5112 Encounter for antineoplastic immunotherapy: Secondary | ICD-10-CM | POA: Diagnosis not present

## 2020-05-22 DIAGNOSIS — C7951 Secondary malignant neoplasm of bone: Secondary | ICD-10-CM | POA: Diagnosis not present

## 2020-05-22 DIAGNOSIS — Z5111 Encounter for antineoplastic chemotherapy: Secondary | ICD-10-CM | POA: Diagnosis not present

## 2020-05-22 DIAGNOSIS — M810 Age-related osteoporosis without current pathological fracture: Secondary | ICD-10-CM

## 2020-05-22 LAB — COMPREHENSIVE METABOLIC PANEL
ALT: 13 U/L (ref 0–44)
AST: 20 U/L (ref 15–41)
Albumin: 3.6 g/dL (ref 3.5–5.0)
Alkaline Phosphatase: 41 U/L (ref 38–126)
Anion gap: 7 (ref 5–15)
BUN: 15 mg/dL (ref 8–23)
CO2: 27 mmol/L (ref 22–32)
Calcium: 10 mg/dL (ref 8.9–10.3)
Chloride: 106 mmol/L (ref 98–111)
Creatinine, Ser: 0.72 mg/dL (ref 0.44–1.00)
GFR, Estimated: >60 mL/min (ref >60)
Glucose, Bld: 106 mg/dL — ABNORMAL HIGH (ref 70–99)
Potassium: 3.9 mmol/L (ref 3.5–5.1)
Sodium: 140 mmol/L (ref 135–145)
Total Bilirubin: 0.3 mg/dL (ref 0.3–1.2)
Total Protein: 6.9 g/dL (ref 6.5–8.1)

## 2020-05-22 LAB — CBC WITH DIFFERENTIAL/PLATELET
Abs Immature Granulocytes: 0 10*3/uL (ref 0.00–0.07)
Basophils Absolute: 0.1 10*3/uL (ref 0.0–0.1)
Basophils Relative: 2 %
Eosinophils Absolute: 0.1 10*3/uL (ref 0.0–0.5)
Eosinophils Relative: 4 %
HCT: 38.8 % (ref 36.0–46.0)
Hemoglobin: 13.5 g/dL (ref 12.0–15.0)
Immature Granulocytes: 0 %
Lymphocytes Relative: 34 %
Lymphs Abs: 0.7 10*3/uL (ref 0.7–4.0)
MCH: 35.4 pg — ABNORMAL HIGH (ref 26.0–34.0)
MCHC: 34.8 g/dL (ref 30.0–36.0)
MCV: 101.8 fL — ABNORMAL HIGH (ref 80.0–100.0)
Monocytes Absolute: 0.2 10*3/uL (ref 0.1–1.0)
Monocytes Relative: 10 %
Neutro Abs: 1.1 10*3/uL — ABNORMAL LOW (ref 1.7–7.7)
Neutrophils Relative %: 50 %
Platelets: 167 10*3/uL (ref 150–400)
RBC: 3.81 MIL/uL — ABNORMAL LOW (ref 3.87–5.11)
RDW: 15.3 % (ref 11.5–15.5)
WBC: 2.1 10*3/uL — ABNORMAL LOW (ref 4.0–10.5)
nRBC: 0 % (ref 0.0–0.2)

## 2020-05-22 MED ORDER — FULVESTRANT 250 MG/5ML IM SOLN
INTRAMUSCULAR | Status: AC
  Filled 2020-05-22: qty 5

## 2020-05-22 MED ORDER — FULVESTRANT 250 MG/5ML IM SOLN
500.0000 mg | Freq: Once | INTRAMUSCULAR | Status: AC
  Administered 2020-05-22: 500 mg via INTRAMUSCULAR

## 2020-05-22 NOTE — Patient Instructions (Signed)
Fulvestrant injection What is this medicine? FULVESTRANT (ful VES trant) blocks the effects of estrogen. It is used to treat breast cancer. This medicine may be used for other purposes; ask your health care provider or pharmacist if you have questions. COMMON BRAND NAME(S): FASLODEX What should I tell my health care provider before I take this medicine? They need to know if you have any of these conditions:  bleeding disorders  liver disease  low blood counts, like low white cell, platelet, or red cell counts  an unusual or allergic reaction to fulvestrant, other medicines, foods, dyes, or preservatives  pregnant or trying to get pregnant  breast-feeding How should I use this medicine? This medicine is for injection into a muscle. It is usually given by a health care professional in a hospital or clinic setting. Talk to your pediatrician regarding the use of this medicine in children. Special care may be needed. Overdosage: If you think you have taken too much of this medicine contact a poison control center or emergency room at once. NOTE: This medicine is only for you. Do not share this medicine with others. What if I miss a dose? It is important not to miss your dose. Call your doctor or health care professional if you are unable to keep an appointment. What may interact with this medicine?  medicines that treat or prevent blood clots like warfarin, enoxaparin, dalteparin, apixaban, dabigatran, and rivaroxaban This list may not describe all possible interactions. Give your health care provider a list of all the medicines, herbs, non-prescription drugs, or dietary supplements you use. Also tell them if you smoke, drink alcohol, or use illegal drugs. Some items may interact with your medicine. What should I watch for while using this medicine? Your condition will be monitored carefully while you are receiving this medicine. You will need important blood work done while you are taking  this medicine. Do not become pregnant while taking this medicine or for at least 1 year after stopping it. Women of child-bearing potential will need to have a negative pregnancy test before starting this medicine. Women should inform their doctor if they wish to become pregnant or think they might be pregnant. There is a potential for serious side effects to an unborn child. Men should inform their doctors if they wish to father a child. This medicine may lower sperm counts. Talk to your health care professional or pharmacist for more information. Do not breast-feed an infant while taking this medicine or for 1 year after the last dose. What side effects may I notice from receiving this medicine? Side effects that you should report to your doctor or health care professional as soon as possible:  allergic reactions like skin rash, itching or hives, swelling of the face, lips, or tongue  feeling faint or lightheaded, falls  pain, tingling, numbness, or weakness in the legs  signs and symptoms of infection like fever or chills; cough; flu-like symptoms; sore throat  vaginal bleeding Side effects that usually do not require medical attention (report to your doctor or health care professional if they continue or are bothersome):  aches, pains  constipation  diarrhea  headache  hot flashes  nausea, vomiting  pain at site where injected  stomach pain This list may not describe all possible side effects. Call your doctor for medical advice about side effects. You may report side effects to FDA at 1-800-FDA-1088. Where should I keep my medicine? This drug is given in a hospital or clinic and will   not be stored at home. NOTE: This sheet is a summary. It may not cover all possible information. If you have questions about this medicine, talk to your doctor, pharmacist, or health care provider.  2020 Elsevier/Gold Standard (2017-08-20 11:34:41)  

## 2020-05-22 NOTE — Progress Notes (Signed)
[facility]  [phone] [phone]) [phone]    ID: April Rosales DOB: 12/03/1940  MR#: [id number]  April Rosales#:[id number]  Patient Care Team: [name], MD as PCP - General (Internal Medicine) Magrinat, [name], MD as Consulting Physician (Oncology) [name], MD as Consulting Physician (Orthopedic Surgery) [name], MD as Consulting Physician (General Surgery) [name], MD as Consulting Physician (Cardiology) OTHER: [name], DDS   CHIEF COMPLAINT: Stage IV estrogen receptor positive breast cancer  CURRENT TREATMENT: Fulvestrant, palbociclib; denosumab/Prolia; trastuzumab/herceptin to 4 weeks   INTERVAL HISTORY: April Rosales is here today for follow up of her metastatic breast cancer unaccompanied today.  April Rosales continues on Fulvestrant every 4 weeks, together with palbociclib 14m daily 3 weeks on and 1 week off.  She tolerates these treatments well.  She was started on Trastuzumab a bout 4 weeks ago.  She is not happy about this addition.  She is concerned that it is causing issues for her.  She has had loose stools, generalized itching.  She doesn't understand why the Trastuzumab was ordered.  She wants to understand how her cancer is doing inside her body prior to receiving any further Herceptin. She is currently in her off week from her Palbociclib.  She is due to restart this on Sunday, 05/27/2020.    We have switched her to denosumab/Prolia every 6 months for osteoporosis.  She started this on 03/28/2020.  Next dose will be due in May.    REVIEW OF SYSTEMS:  [name] doing moderately well today.  She is tearful, confused, and wants more information about the reasons behind her treatment from Dr. MJana Hakim  She denies any nausea or vomiting.  She is without fever or chills.  She has no bladder changes, headaches, vision issues, or pain.  A detailed ROS was otherwise non contributory today.      COVID 19 VACCINATION STATUS:  fully vaccinated (Visual merchandiser   BREAST CANCER HISTORY: From the original intake note:   [name]. She tells me she had more than 30 lymph nodes removed from her right axilla and all of them were clear. She received  adjuvant radiation but no systemic treatment.  The patient had recently refused mammography with the last mammogram I can find dating back to August 2010.  More recently the patient presented with left scapular pain radiating down the left arm.  She was evaluated by Dr. YLorin Mercy who obtained a chest x-ray showing a possible abnormality at T3. He then set up the patient for an MRI of the thoracic spine performed 08/16/2014.  This showed multiple compression fractures  (a similar picture had been noted on lumbar MRI 10/09/2011 ). However at T3 they noted tumor in the vertebral body extending into the left pedicle and into the lateral epidural space, displacing the cord to the right. There was no evidence of cord compression or cord signal abnormality. There were no other areas of tumor identified in the thoracic spine.         The patient was then referred to Dr. KHal Neerwho on 08/24/2014 set April Sauerup for CT scans of the chest, abdomen and pelvis. There was a dense mass in the upper inner quadrant of the left breast measuring 1.6 cm. There was a 1.2 cm nodule in the minor fissure of the right lung and some evidence of right lung fibrosis at the site  of the prior radiation port.  There was also a thyroid mass measuring 2 cm. However there were no parenchymal lung or liver lesions. Incidental meningoceles were noted as well as sclerosis of the fifth and sixth ribs which were felt to be likely posttraumatic.   On 08/29/2014 the patient underwent bilateral diagnostic mammography with tomosynthesis and left breast ultrasonography.  There were postsurgical changes in the upper right breast. In  the left breast there was an irregular mass measuring 2.3 cm in the upper inner quadrant. This was palpable. Ultrasound showed this to be hypoechoic and to measure 2.0 cm. There were adjacent areas of nodularity. There was no definite lymphadenopathy in the left axilla.  Biopsy of this breast mass 08/29/2014 showed an invasive adenocarcinoma with both ductal and lobular features (there was strong diffuse April Rosales expression as well as areas with total absence of April Rosales expression), with the preliminary prognostic profile showing strong estrogen positivity, very weak to near absent progesterone positivity, an MIB-1 of approximately 40%, and HER-2 equivocal  The patient's subsequent history is as detailed below.   PAST MEDICAL HISTORY: Past Medical History:  Diagnosis Date  . Arthritis   . Breast cancer [facility] Rosales) 1985/2016   takes [name] daily  . Chronic back pain    stenosis/listhesis  . Family history of ovarian cancer   . Osteoporosis    takes Vit D  . Radiation 11/23/14-12/11/14   30 Gy T1-T5     PAST SURGICAL HISTORY: Past Surgical History:  Procedure Laterality Date  . ABDOMINAL HYSTERECTOMY     1980  . APPENDECTOMY  1977  . APPLICATION OF INTRAOPERATIVE CT SCAN N/A 10/20/2014   Procedure: APPLICATION OF INTRAOPERATIVE CAT SCAN;  Surgeon: [name], MD;  Location: [place] [facility];  Service: Neurosurgery;  Laterality: N/A;  . BREAST LUMPECTOMY WITH RADIOACTIVE SEED LOCALIZATION Left 05/03/2018   Procedure: LEFT BREAST LUMPECTOMY WITH BRACKETED RADIOACTIVE SEED LOCALIZATION;  Surgeon: [name], MD;  Location: [place];  Service: General;  Laterality: Left;  . BREAST SURGERY Right 1985  . THYROID CYST EXCISION  1967    FAMILY HISTORY Family History  Problem Relation Age of Onset  . Heart disease Mother   . Hypertension Mother   . Heart disease Father   . Diabetes Father   . Breast cancer Paternal Aunt        4 paternal aunts with breast cancer over  29  . Prostate cancer Paternal Uncle   . Stroke Paternal Grandfather   . Ovarian cancer Paternal Aunt   . Huntington's disease Other        Nephew, inherited from his father   The patient's father died from a heart attack at the age of 44. He had 9 sisters. 3 of those sisters had breast cancer, all in a menopausal setting. Another sister had ovarian cancer. One of the paternal uncles had cancer of the colon "and back".  The patient's mother died at the age of 55. She was found to have breast cancer shortly before dying , during her final hospitalization.   GYNECOLOGIC HISTORY:  No LMP recorded. Patient has had a hysterectomy.  Menarche age 66, first live birth age 27, the patient is GX P1. She underwent hysterectomy in 1980. She thinks the ovaries were removed, but the CT scan obtained 08/24/2014 showed a definite right ovary. The left ovary may have been removed. She did not take hormone replacement after the hysterectomy.   SOCIAL HISTORY:   Jelisha worked in Psychiatric nurse.  She is divorced, lives alone, with 2 cats. Her son Bethann Berkshire lives in [place] where he works in Engineer, technical sales. He has 4 children of his own. The patient is a Psychologist, forensic.   ADVANCED DIRECTIVES:  In place. The patient has named her sister Pleas Koch [phone]) and her friend Janina Mayo [phone]) as joint healthcare powers of attorney   HEALTH MAINTENANCE: Social History   Tobacco Use  . Smoking status: Former Smoker    Packs/day: 0.50    Years: 10.00    Pack years: 5.00    Types: Cigarettes    Quit date: 05/03/1970    Years since quitting: 50.0  . Smokeless tobacco: Former Systems developer  . Tobacco comment: quit smoking 1970's  Substance Use Topics  . Alcohol use: Yes    Alcohol/week: 0.0 standard drinks    Comment: Rare  . Drug use: No     Colonoscopy: never  PAP: status post hysterectomy  Bone density: 09/12/2016 [facility]/ T score of -4.8 osteoporosis  Lipid panel:  Allergies  Allergen  Reactions  . Ativan [Lorazepam]     Made her crazy  . Dilaudid [Hydromorphone Hcl]     Doesn't want  . Haldol [Haloperidol Lactate]     Made her crazy    Current Outpatient Medications  Medication Sig Dispense Refill  . Ascorbic Acid (VITAMIN C PO) Take 1 tablet by mouth every other day. Takes qod    . aspirin EC 325 MG tablet Take 1 tablet (325 mg total) by mouth daily. 30 tablet 0  . BIOTIN PO Take 1 tablet by mouth daily.    . Cholecalciferol (VITAMIN D3 ADULT GUMMIES PO) Take 2,000 Units by mouth daily.     . Cyanocobalamin (VITAMIN B-12 PO) Take by mouth daily. Takes every other day    . diazepam (VALIUM) 5 MG tablet Take as directed prior to procedure. 3 tablet 0  . Iron-Vitamins (GERITOL PO) Take by mouth daily.    . naproxen (NAPROSYN) 500 MG tablet Take     1 tablet     2 x  /day (every 12 hours)       with Food for Pain & Inflammation 60 tablet 0  . OVER THE COUNTER MEDICATION OTC Apple Cider Vinegar 1 capsule daily.    . palbociclib (IBRANCE) 75 MG tablet Take 1 tablet (75 mg total) by mouth daily. Take for 21 days on, 7 days off, repeat every 28 days. Start 01/04/2020 21 tablet 6  . traMADol (ULTRAM) 50 MG tablet Take 1 tablet (50 mg total) by mouth every 12 (twelve) hours as needed. 30 tablet 0  . triamcinolone ointment (KENALOG) 0.5 % Apply 1 application topically 2 (two) times daily. 30 g 0   No current facility-administered medications for this visit.    OBJECTIVE: White Rosales who appears anxious   There were no vitals filed for this visit. Wt Readings from Last 3 Encounters:  05/15/20 120 lb (54.4 kg)  04/25/20 120 lb 9.6 oz (54.7 kg)  03/28/20 122 lb 6.4 oz (55.5 kg)   There is no height or weight on file to calculate BMI.    ECOG FS:1 - Symptomatic but completely ambulatory  GENERAL: Patient is a well appearing female in no acute distress HEENT:  Sclerae anicteric.  Mask in place. Neck is supple.  NODES:  No cervical, supraclavicular, or axillary  lymphadenopathy palpated.  BREAST EXAM:  Deferred. LUNGS:  Clear to auscultation bilaterally.  No wheezes or rhonchi. HEART:  Regular rate and rhythm. No murmur  appreciated. ABDOMEN:  Soft, nontender.  Positive, normoactive bowel sounds. No organomegaly palpated. MSK:  No focal spinal tenderness to palpation. Full range of motion bilaterally in the upper extremities. EXTREMITIES:  No peripheral edema.   SKIN:  Clear with no obvious rashes or skin changes. No nail dyscrasia. NEURO:  Nonfocal. Well oriented.  Anxious and tearful affect     LAB RESULTS:  CMP     Component Value Date/Time   NA 140 04/25/2020 1109   NA 141 05/29/2017 1417   K 4.1 04/25/2020 1109   K 3.6 05/29/2017 1417   CL 107 04/25/2020 1109   CO2 24 04/25/2020 1109   CO2 27 05/29/2017 1417   GLUCOSE 105 (H) 04/25/2020 1109   GLUCOSE 111 05/29/2017 1417   BUN 12 04/25/2020 1109   BUN 17.0 05/29/2017 1417   CREATININE 0.64 04/25/2020 1109   CREATININE 0.66 03/05/2020 1218   CREATININE 0.8 05/29/2017 1417   CALCIUM 10.2 04/25/2020 1109   CALCIUM 10.7 (H) 06/26/2017 1216   CALCIUM 10.4 05/29/2017 1417   PROT 7.1 04/25/2020 1109   PROT 7.3 05/29/2017 1417   ALBUMIN 3.7 04/25/2020 1109   ALBUMIN 3.9 05/29/2017 1417   AST 17 04/25/2020 1109   AST 34 12/02/2019 1147   AST 17 05/29/2017 1417   ALT 11 04/25/2020 1109   ALT 30 12/02/2019 1147   ALT 15 05/29/2017 1417   ALKPHOS 50 04/25/2020 1109   ALKPHOS 42 05/29/2017 1417   BILITOT 0.4 04/25/2020 1109   BILITOT 0.4 12/02/2019 1147   BILITOT 0.30 05/29/2017 1417   GFRNONAA >60 04/25/2020 1109   GFRNONAA 83 03/05/2020 1218   GFRAA 97 03/05/2020 1218    INo results found for: SPEP, UPEP  Lab Results  Component Value Date   WBC 2.6 (L) 04/25/2020   NEUTROABS 1.4 (L) 04/25/2020   HGB 13.5 04/25/2020   HCT 39.3 04/25/2020   MCV 101.6 (H) 04/25/2020   PLT 206 04/25/2020      Chemistry      Component Value Date/Time   NA 140 04/25/2020 1109   NA  141 05/29/2017 1417   K 4.1 04/25/2020 1109   K 3.6 05/29/2017 1417   CL 107 04/25/2020 1109   CO2 24 04/25/2020 1109   CO2 27 05/29/2017 1417   BUN 12 04/25/2020 1109   BUN 17.0 05/29/2017 1417   CREATININE 0.64 04/25/2020 1109   CREATININE 0.66 03/05/2020 1218   CREATININE 0.8 05/29/2017 1417      Component Value Date/Time   CALCIUM 10.2 04/25/2020 1109   CALCIUM 10.7 (H) 06/26/2017 1216   CALCIUM 10.4 05/29/2017 1417   ALKPHOS 50 04/25/2020 1109   ALKPHOS 42 05/29/2017 1417   AST 17 04/25/2020 1109   AST 34 12/02/2019 1147   AST 17 05/29/2017 1417   ALT 11 04/25/2020 1109   ALT 30 12/02/2019 1147   ALT 15 05/29/2017 1417   BILITOT 0.4 04/25/2020 1109   BILITOT 0.4 12/02/2019 1147   BILITOT 0.30 05/29/2017 1417       Lab Results  Component Value Date   LABCA2 24 01/01/2016    No components found for: [id number]  No results for input(s): INR in the last 168 hours.  Urinalysis    Component Value Date/Time   COLORURINE YELLOW 03/05/2020 [place] 03/05/2020 1218   LABSPEC 1.006 03/05/2020 1218   PHURINE 7.0 03/05/2020 [place] 03/05/2020 [place] 03/05/2020 [place]  09/19/2015 [place] 03/05/2020 [place] 03/05/2020 1218   NITRITE NEGATIVE 03/05/2020 1218   LEUKOCYTESUR 2+ (A) 03/05/2020 1218    STUDIES: MR Lumbar Spine w/o contrast  Result Date: 05/13/2020 CLINICAL DATA:  Worsening back pain since 03/15/2020. History of metastatic breast cancer. EXAM: MRI LUMBAR SPINE WITHOUT CONTRAST TECHNIQUE: Multiplanar, multisequence MR imaging of the lumbar spine was performed. No intravenous contrast was administered. COMPARISON:  Radiography 03/27/2020.  MRI 06/05/2017. FINDINGS: Segmentation: 5 lumbar type vertebral bodies as numbered previously. Alignment: Lower thoracic curvature convex to the left. Lumbar curvature convex to the right. Vertebrae: Recent superior  endplate fracture at Y65 with loss of height 10%. No retropulsed bone. No finding to suggest that this is anything other than a benign osteoporotic fracture. Old minor superior endplate deformity at L93. Old compression fracture at L1 with loss of height of 40%. Old superior and inferior endplate fractures at L2 with maximal loss of height of 40%. Old superior and inferior endplate fractures at L3 with loss of height of 50% centrally. Old minimal superior endplate depression at L4. L5 and the sacrum are negative. Conus medullaris and cauda equina: Conus extends to the T12-L1 level. Conus and cauda equina appear normal. Paraspinal and other soft tissues: Negative Disc levels: Mild age related disc desiccation and bulging. No disc herniation. No compressive stenosis of the canal or foramina. No significant facet arthropathy. IMPRESSION: 1. Recent superior endplate fracture at T70 with loss of height of 10%. No retropulsed bone. No finding to suggest that this is anything other than a benign osteoporotic fracture. 2. Old healed compression fractures at T12, L1, L2, L3 and L4. No retropulsed bone or traumatic disc herniation. No compressive stenosis of the canal or foramina. Electronically Signed   By: Nelson Chimes M.D.   On: 05/13/2020 15:11     ASSESSMENT: 79 y.o. April Rosales with stage IV left breast cancer involving bone  (1) status post right lumpectomy and axillary node dissection in 1985 followed by radiation at [place] 08/16/2014: measurable disease in spine, lung and left breast   (2) evaluation for left shoulder pain led to thoracic spine MRI 08/16/2014 showing a pathologic fracture at T3 with epidural tumor displacing the cord to the right, but no cord compression. CT scans of the chest, abdomen and pelvis 08/24/2014 showed in addition a mass in the upper outer quadrant left breast measuring 1.6 cm and a nodule in the minor fissure of the right lung measuring 1.2 cm, but no  parenchymal lung or liver lesions.   (a) CA 27-29 was noninformative at 38 (09/20/2014)    (3) mammography and ultrasonography 08/29/2014 show a mass in the upper inner left breast which was palpable,  measuring 2.0 cm by ultrasound. Biopsy of this mass 08/29/2014 showed an invasive breast cancer with both lobular and ductal features, estrogen receptor positive, progesterone receptor weakly positive, with an MIB-1 in the 40% range, HER-2 equivocal (6 else ratio 1.5, but average number her nucleus 5.8)   (4) letrozole started 08/31/2014;   (a) palbociclib added Sept 2016 at 75 mg 21/7, with significant neutropenia; not repeated after first cycle  (b) letrozole discontinued 05/29/2017 with evidence of disease progression in the breast   (5)  zolendronate started 09/20/2014, stopped after initial dose due to poor tolerance  (a) denosumab/ Xgeva started 01/31/2015, repeated every 4 weeks  (b) changed to every 8 weeks as of August 2018.   (c) every 12 weeks recommended  and ordered to start 01/2020   (6) on 10/20/2014 the patient underwent T2-T3 and T4 decompressive laminectomy with removal of epidural tumor, C7-T4 segmental pedicle screw instrumentation with virage screw system with arrow guidance protocol and C7-T4 posterolateral fusion. The cells were positive for the estrogen receptor. HER-2/neu testing by [facility] showed again equivocal results,  (a) most recent bone scan 10/01/2016 finds no new lesions only postoperative changes  (7) radiation 11/23/2014-12/11/2014.  (a) T1-T5 was treated to 30 Gy in 12 fractions at 2.5 Gy per fraction   (8) initiated close follow-up while considering eventual left lumpectomy or mastectomy depending on  longer-term results of systemic therapy  (a) most recent left breast ultrasonography 10/30/2016 found this mass to measure 0.7 cm  (b) repeat left breast ultrasonography 05/13/2017 showed the upper outer quadrant mass to have grown to 1.4 cm  (c) left breast  ultrasound 09/08/2017 shows the mass to now measure 0.9 cm  (d) left breast biopsy x2 on 12/11/2017 shows high-grade ductal carcinoma in situ, essentially estrogen receptor negative  (9) genetics testing using the Breast/Ovarian Cancer Panel through [facility] [name], MD) found no deleterious mutations in ATM, BARD1, BRCA1, BRCA2, BRIP1, CDH1, CHEK2, EPCAM, FANCC, MLH1, MSH2, MSH6, NBN, PALB2, PMS2, PTEN, RAD51C, RAD51D, STK11, TP53, or XRCC2    (10) right thyroid nodule is a complex cyst as noted on CT scan of the neck 01/29/2016  (11) osteoporosis: Bone density at [facility] 09/12/2016 showed a T score of -4.8.  (a) switched to Prolia starting 03/29/2020  (12) fulvestrant started 05/29/2017, last dose 07/22/2018 (discontinued due to pandemic).  (a) started palbociclib 06/29/2017 at 75 mg every other day for 21 days on, 7 days off  (b) palbociclib discontinued on 3/29 due to progressive fatigue and patient preference  (13)  was to start abemaciclib 02/04/2018, but patient opted for going back to palbociclib at a lower dose beginning in November 19, patient subsequently declined s palbociclib, and  proceeded to surgery  (14) status post left lumpectomy 05/03/2018 showing a pT1b pNX invasive ductal carcinoma, grade 2, with equivocal HER-2 results  (a) opted against adjuvant radiation given presence of stage IV disease  (15) tamoxifen supposed to have been started started 09/13/2018 as a "bridge" pending resumption of fulvestrant/denosumab, but never started by the patient  (16) PET scan 11/02/2018 showed no active disease  (a) cerianna scan on 10/31/2019 shows activity in 2 liver lesions, no active bone or lung lesions  (b) abdominal ultrasound 11/08/2019 confirms 2 liver lesions measuring 4.4 and 2.1 cm.  (c) biopsy of 1 of the liver lesions 12/12/2019 confirmed metastatic breast cancer, estrogen and progesterone receptor positive, with an MIB-1 of 10%, and HER-2 positive, with a copy number of  2.12 and the number per cell 6.25  (d) abdominal ultrasound 03/12/2020 shows one of the liver lesions to have increased, while the second lesion has decreased  (17) letrozole started 02/15/2019, discontinued 12/02/2019 with evidence of progression  (a) bone density 09/12/2016 showed a T score of -4.8.  (18) fulvestrant resumed 12/08/2019  (a) palbociclib added at 75 mg daily 21/7 starting 12/20/2019   (19) to start trastuzumab 04/25/2020, repeat every 28 days  (a) echo 03/12/2020 shows an ejection fraction in the 60-65% range   PLAN: April Rosales is doing moderately well today.  She has had some increased difficulty since receiving Trastuzumab 4 weeks ago.  She is tearful and wants more information prior to receiving this treatment again.  I validated Sharren's concerns today.    I printed  her lab results and reviewed them with her today in detail.  Her ANC is slightly lower at 1.1.  This is stable and doesn't significantly increase her infection risk which I reviewed with her.  I reassured her that her kidney function is normal, and she should still drink a little more water since she feels like she doesn't get enough, but appears stable at this point.    She will forego the Trastuzumab today.  She will receive Fulvestrant injections.  I let her know that she would need to f/u with Dr. Jana Hakim when he returns to the office next week to discuss her concerns around treatment and more scans.  She will have a lab visit prior to her visit with him.  She was very appreciative of this.    April Rosales knows to call us if she has any concerns or problems.  Total encounter time 30 minutes.Wilber Bihari, NP 05/22/20 7:42 AM Medical Oncology and Hematology [facility] [place],  [address] Tel. [phone]    Fax. [phone]    *Total Encounter Time as defined by the Centers for Medicare and Medicaid Services includes, in addition to the face-to-face time of a  patient visit (documented in the note above) non-face-to-face time: obtaining and reviewing outside history, ordering and reviewing medications, tests or procedures, care coordination (communications with other health care professionals or caregivers) and documentation in the medical record.

## 2020-05-23 ENCOUNTER — Other Ambulatory Visit: Payer: Medicare Other

## 2020-05-23 ENCOUNTER — Telehealth: Payer: Self-pay | Admitting: Adult Health

## 2020-05-23 ENCOUNTER — Ambulatory Visit: Payer: Medicare Other

## 2020-05-23 NOTE — Telephone Encounter (Signed)
Scheduled appts per 12/28 los. Pt confirmed appt date and time.  

## 2020-05-29 ENCOUNTER — Telehealth: Payer: Self-pay | Admitting: *Deleted

## 2020-05-29 NOTE — Telephone Encounter (Signed)
Per pt's call appointment for 05/30/2020 cancelled per her concern with the current status of the Omicron variant of Covid.  Above reviewed with MD and verified appropriate.  Pt will restart Ibrance cycle 1/4 and is scheduled for next appt 06/20/2020.

## 2020-05-30 ENCOUNTER — Other Ambulatory Visit: Payer: Medicare Other

## 2020-05-30 ENCOUNTER — Ambulatory Visit: Payer: Medicare Other | Admitting: Oncology

## 2020-06-08 ENCOUNTER — Other Ambulatory Visit: Payer: Self-pay | Admitting: Oncology

## 2020-06-13 NOTE — Progress Notes (Signed)
Pharmacist Chemotherapy Monitoring - Initial Assessment    Anticipated start date: 06/20/20   Regimen:  . Are orders appropriate based on the patient's diagnosis, regimen, and cycle? Yes . Does the plan date match the patient's scheduled date? Yes . Is the sequencing of drugs appropriate? Yes . Are the premedications appropriate for the patient's regimen? Yes . Prior Authorization for treatment is: Approved o If applicable, is the correct biosimilar selected based on the patient's insurance? yes  Organ Function and Labs: Marland Kitchen Are dose adjustments needed based on the patient's renal function, hepatic function, or hematologic function? Yes . Are appropriate labs ordered prior to the start of patient's treatment? Yes . Other organ system assessment, if indicated: trastuzumab: Echo/ MUGA . The following baseline labs, if indicated, have been ordered: N/A  Dose Assessment: . Are the drug doses appropriate? Yes . Are the following correct: o Drug concentrations Yes o IV fluid compatible with drug Yes o Administration routes Yes o Timing of therapy Yes . If applicable, does the patient have documented access for treatment and/or plans for port-a-cath placement? not applicable . If applicable, have lifetime cumulative doses been properly documented and assessed? not applicable Lifetime Dose Tracking  No doses have been documented on this patient for the following tracked chemicals: Doxorubicin, Epirubicin, Idarubicin, Daunorubicin, Mitoxantrone, Bleomycin, Oxaliplatin, Carboplatin, Liposomal Doxorubicin  o   Toxicity Monitoring/Prevention: . The patient has the following take home antiemetics prescribed: N/A . The patient has the following take home medications prescribed: N/A . Medication allergies and previous infusion related reactions, if applicable, have been reviewed and addressed. Yes . The patient's current medication list has been assessed for drug-drug interactions with their  chemotherapy regimen. no significant drug-drug interactions were identified on review.  Order Review: . Are the treatment plan orders signed? Yes . Is the patient scheduled to see a provider prior to their treatment? Yes  I verify that I have reviewed each item in the above checklist and answered each question accordingly.  Lori Popowski K 06/13/2020 2:06 PM

## 2020-06-20 ENCOUNTER — Inpatient Hospital Stay: Payer: Medicare Other

## 2020-06-20 ENCOUNTER — Ambulatory Visit: Payer: Medicare Other

## 2020-06-20 ENCOUNTER — Other Ambulatory Visit: Payer: Medicare Other

## 2020-06-20 ENCOUNTER — Inpatient Hospital Stay: Payer: Medicare Other | Attending: Oncology

## 2020-06-20 ENCOUNTER — Other Ambulatory Visit: Payer: Self-pay

## 2020-06-20 ENCOUNTER — Inpatient Hospital Stay (HOSPITAL_BASED_OUTPATIENT_CLINIC_OR_DEPARTMENT_OTHER): Payer: Medicare Other | Admitting: Oncology

## 2020-06-20 VITALS — BP 156/73 | HR 100 | Temp 98.4°F | Resp 18 | Ht 61.0 in | Wt 117.3 lb

## 2020-06-20 DIAGNOSIS — C50919 Malignant neoplasm of unspecified site of unspecified female breast: Secondary | ICD-10-CM

## 2020-06-20 DIAGNOSIS — Z170 Estrogen receptor positive status [ER+]: Secondary | ICD-10-CM | POA: Insufficient documentation

## 2020-06-20 DIAGNOSIS — C50412 Malignant neoplasm of upper-outer quadrant of left female breast: Secondary | ICD-10-CM | POA: Insufficient documentation

## 2020-06-20 DIAGNOSIS — Z5112 Encounter for antineoplastic immunotherapy: Secondary | ICD-10-CM | POA: Insufficient documentation

## 2020-06-20 DIAGNOSIS — Z5111 Encounter for antineoplastic chemotherapy: Secondary | ICD-10-CM | POA: Insufficient documentation

## 2020-06-20 DIAGNOSIS — C7951 Secondary malignant neoplasm of bone: Secondary | ICD-10-CM

## 2020-06-20 DIAGNOSIS — M818 Other osteoporosis without current pathological fracture: Secondary | ICD-10-CM | POA: Diagnosis not present

## 2020-06-20 DIAGNOSIS — Z7189 Other specified counseling: Secondary | ICD-10-CM | POA: Diagnosis not present

## 2020-06-20 DIAGNOSIS — M810 Age-related osteoporosis without current pathological fracture: Secondary | ICD-10-CM

## 2020-06-20 LAB — CBC WITH DIFFERENTIAL/PLATELET
Abs Immature Granulocytes: 0.01 10*3/uL (ref 0.00–0.07)
Basophils Absolute: 0.1 10*3/uL (ref 0.0–0.1)
Basophils Relative: 3 %
Eosinophils Absolute: 0.1 10*3/uL (ref 0.0–0.5)
Eosinophils Relative: 5 %
HCT: 39.6 % (ref 36.0–46.0)
Hemoglobin: 13.6 g/dL (ref 12.0–15.0)
Immature Granulocytes: 0 %
Lymphocytes Relative: 37 %
Lymphs Abs: 1 10*3/uL (ref 0.7–4.0)
MCH: 35.2 pg — ABNORMAL HIGH (ref 26.0–34.0)
MCHC: 34.3 g/dL (ref 30.0–36.0)
MCV: 102.6 fL — ABNORMAL HIGH (ref 80.0–100.0)
Monocytes Absolute: 0.3 10*3/uL (ref 0.1–1.0)
Monocytes Relative: 10 %
Neutro Abs: 1.3 10*3/uL — ABNORMAL LOW (ref 1.7–7.7)
Neutrophils Relative %: 45 %
Platelets: 166 10*3/uL (ref 150–400)
RBC: 3.86 MIL/uL — ABNORMAL LOW (ref 3.87–5.11)
RDW: 14.8 % (ref 11.5–15.5)
WBC: 2.8 10*3/uL — ABNORMAL LOW (ref 4.0–10.5)
nRBC: 0 % (ref 0.0–0.2)

## 2020-06-20 LAB — COMPREHENSIVE METABOLIC PANEL
ALT: 13 U/L (ref 0–44)
AST: 18 U/L (ref 15–41)
Albumin: 3.6 g/dL (ref 3.5–5.0)
Alkaline Phosphatase: 41 U/L (ref 38–126)
Anion gap: 6 (ref 5–15)
BUN: 17 mg/dL (ref 8–23)
CO2: 30 mmol/L (ref 22–32)
Calcium: 10.2 mg/dL (ref 8.9–10.3)
Chloride: 105 mmol/L (ref 98–111)
Creatinine, Ser: 0.74 mg/dL (ref 0.44–1.00)
GFR, Estimated: >60 mL/min (ref >60)
Glucose, Bld: 104 mg/dL — ABNORMAL HIGH (ref 70–99)
Potassium: 5.1 mmol/L (ref 3.5–5.1)
Sodium: 141 mmol/L (ref 135–145)
Total Bilirubin: 0.3 mg/dL (ref 0.3–1.2)
Total Protein: 7 g/dL (ref 6.5–8.1)

## 2020-06-20 MED ORDER — FULVESTRANT 250 MG/5ML IM SOLN
INTRAMUSCULAR | Status: AC
  Filled 2020-06-20: qty 10

## 2020-06-20 MED ORDER — ACETAMINOPHEN 325 MG PO TABS
ORAL_TABLET | ORAL | Status: AC
  Filled 2020-06-20: qty 2

## 2020-06-20 MED ORDER — ACETAMINOPHEN 325 MG PO TABS
650.0000 mg | ORAL_TABLET | Freq: Once | ORAL | Status: AC
  Administered 2020-06-20: 650 mg via ORAL

## 2020-06-20 MED ORDER — TRASTUZUMAB-HYALURONIDASE-OYSK 600-10000 MG-UNT/5ML ~~LOC~~ SOLN
600.0000 mg | Freq: Once | SUBCUTANEOUS | Status: AC
  Administered 2020-06-20: 600 mg via SUBCUTANEOUS
  Filled 2020-06-20: qty 5

## 2020-06-20 MED ORDER — FULVESTRANT 250 MG/5ML IM SOLN
500.0000 mg | Freq: Once | INTRAMUSCULAR | Status: AC
  Administered 2020-06-20: 500 mg via INTRAMUSCULAR

## 2020-06-20 NOTE — Progress Notes (Signed)
[place]  Telephone:(336) [phone] Fax:(336) [phone]    ID: April Rosales DOB: 1941/04/01  MR#: [id number]  AUQ#:[id number]  Patient Care Team: [name], MD as PCP - General (Internal Medicine) Hakeem Frazzini, [name], MD as Consulting Physician (Oncology) [name], MD as Consulting Physician (Orthopedic Surgery) [name], MD as Consulting Physician (General Surgery) [name], MD as Consulting Physician (Cardiology) OTHER: [name], DDS   CHIEF COMPLAINT: Stage IV estrogen receptor positive breast cancer  CURRENT TREATMENT: Fulvestrant, palbociclib; denosumab/Prolia; trastuzumabsubQ every 4 weeks   INTERVAL HISTORY: April Rosales is here today for follow up of her metastatic breast cancer unaccompanied today.  Her sister was unable to accompany her today.  April Rosales continues on Fulvestrant every 4 weeks, together with palbociclib 57m daily 3 weeks on and 1 week off.  She tolerates these treatments well and reports no problems with unusual fatigue nausea or other complications..   We have switched her to denosumab/Prolia every 6 months for osteoporosis.  She started this on 03/28/2020.  Next dose will be due in May.  She received 1 dose of trastuzumab which she tolerated poorly.  We have changed that from IV to subcu and hope that she will do better with this dosing form   REVIEW OF SYSTEMS:  CRivkatells me things are "not good".  She is depressed and feels overwhelmed.  She has had some diarrhea issues.  She has been "staying in" because of the pandemic.  She has had some sinus issues and a little bit of epistaxis.  Occasionally also her gums bleed she says.  She seen a little bit of blood in her stool.  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 She tells me she had more than 30 lymph nodes removed from her right axilla and all of them were clear. She received  adjuvant radiation but no systemic treatment.  The patient had recently refused mammography with the last mammogram I can find dating back to August 2010.  More recently the patient presented with left scapular pain radiating down the left arm.  She was evaluated by Dr. YLorin Mercy who obtained a chest x-ray showing a possible abnormality at T3. He then set up the patient for an MRI of the thoracic spine performed 08/16/2014.  This showed multiple compression fractures  (a similar picture had been noted on lumbar MRI 10/09/2011 ). However at T3 they noted tumor in the vertebral body extending into the left pedicle and into the lateral epidural space, displacing the cord to the right. There was no evidence of cord compression or cord signal abnormality. There were no other areas of tumor identified in the thoracic spine.         The patient was then referred to Dr. KHal Neerwho on 08/24/2014 set [name] for CT scans of the chest, abdomen and pelvis. There was a dense mass in the upper inner quadrant of the left breast measuring 1.6 cm. There was a 1.2 cm nodule in the minor fissure of the right lung and some evidence of right lung fibrosis at the site of the prior radiation port.  There was also a thyroid mass measuring 2 cm. However there were no parenchymal lung or liver lesions. Incidental  meningoceles were noted as well as sclerosis of the fifth and sixth ribs which were felt to be likely posttraumatic.   On 08/29/2014 the patient underwent bilateral diagnostic mammography with tomosynthesis and left breast ultrasonography.  There were postsurgical changes in the upper right breast. In the left breast there was an irregular mass measuring 2.3 cm in the upper inner quadrant. This was palpable. Ultrasound showed this to be hypoechoic and  to measure 2.0 cm. There were adjacent areas of nodularity. There was no definite lymphadenopathy in the left axilla.  Biopsy of this breast mass 08/29/2014 showed an invasive adenocarcinoma with both ductal and lobular features (there was strong diffuse E-cadherin expression as well as areas with total absence of E-cadherin expression), with the preliminary prognostic profile showing strong estrogen positivity, very weak to near absent progesterone positivity, an MIB-1 of approximately 40%, and HER-2 equivocal  The patient's subsequent history is as detailed below.   PAST MEDICAL HISTORY: Past Medical History:  Diagnosis Date  . Arthritis   . Breast cancer [facility]) 1985/2016   takes [name] daily  . Chronic back pain    stenosis/listhesis  . Family history of ovarian cancer   . Osteoporosis    takes Vit D  . Radiation 11/23/14-12/11/14   30 Gy T1-T5     PAST SURGICAL HISTORY: Past Surgical History:  Procedure Laterality Date  . ABDOMINAL HYSTERECTOMY     1980  . APPENDECTOMY  1977  . APPLICATION OF INTRAOPERATIVE CT SCAN N/A 10/20/2014   Procedure: APPLICATION OF INTRAOPERATIVE CAT SCAN;  Surgeon: [name], MD;  Location: [place] [facility];  Service: Neurosurgery;  Laterality: N/A;  . BREAST LUMPECTOMY WITH RADIOACTIVE SEED LOCALIZATION Left 05/03/2018   Procedure: LEFT BREAST LUMPECTOMY WITH BRACKETED RADIOACTIVE SEED LOCALIZATION;  Surgeon: [name], MD;  Location: [place];  Service: General;  Laterality: Left;  . BREAST SURGERY Right 1985  . THYROID CYST EXCISION  1967    FAMILY HISTORY Family History  Problem Relation Age of Onset  . Heart disease Mother   . Hypertension Mother   . Heart disease Father   . Diabetes Father   . Breast cancer Paternal Aunt        4 paternal aunts with breast cancer over 59  . Prostate cancer Paternal Uncle   . Stroke Paternal Grandfather   . Ovarian cancer Paternal Aunt   . Huntington's disease Other        Nephew,  inherited from his father  The patient's father died from a heart attack at the age of 58. He had 9 sisters. 3 of those sisters had breast cancer, all in a menopausal setting. Another sister had ovarian cancer. One of the paternal uncles had cancer of the colon "and back".  The patient's mother died at the age of 53. She was found to have breast cancer shortly before dying , during her final hospitalization.   GYNECOLOGIC HISTORY:  No LMP recorded. Patient has had a hysterectomy.  Menarche age 60, first live birth age 36, the patient is GX P1. She underwent hysterectomy in 1980. She thinks the ovaries were removed, but the CT scan obtained 08/24/2014 showed a definite right ovary. The left ovary may have been removed. She did not take hormone replacement after the hysterectomy.   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 She is divorced, lives alone, with 2 cats. Her son Bethann Berkshire lives in [place] where he works in Engineer, technical sales. He has 4 children of his  own. The patient is a Psychologist, forensic.   ADVANCED DIRECTIVES:  In place. The patient has named her sister Pleas Koch [phone]) and her friend Janina Mayo [phone]) as joint healthcare powers of attorney   HEALTH MAINTENANCE: Social History   Tobacco Use  . Smoking status: Former Smoker    Packs/day: 0.50    Years: 10.00    Pack years: 5.00    Types: Cigarettes    Quit date: 05/03/1970    Years since quitting: 50.1  . Smokeless tobacco: Former Systems developer  . Tobacco comment: quit smoking 1970's  Substance Use Topics  . Alcohol use: Yes    Alcohol/week: 0.0 standard drinks    Comment: Rare  . Drug use: No     Colonoscopy: never  PAP: status post hysterectomy  Bone density: 09/12/2016 [facility]/ T score of -4.8 osteoporosis  Lipid panel:  Allergies  Allergen Reactions  . Ativan [Lorazepam]     Made her crazy  . Dilaudid [Hydromorphone Hcl]     Doesn't want  . Haldol [Haloperidol Lactate]     Made her crazy     Current Outpatient Medications  Medication Sig Dispense Refill  . Ascorbic Acid (VITAMIN C PO) Take 1 tablet by mouth every other day. Takes qod    . Cholecalciferol (VITAMIN D3 ADULT GUMMIES PO) Take 2,000 Units by mouth daily.     . Cyanocobalamin (VITAMIN B-12 PO) Take by mouth daily. Takes every other day    . Iron-Vitamins (GERITOL PO) Take by mouth daily.    Marland Kitchen OVER THE COUNTER MEDICATION OTC Apple Cider Vinegar 1 capsule daily.    . palbociclib (IBRANCE) 75 MG tablet Take 1 tablet (75 mg total) by mouth daily. Take for 21 days on, 7 days off, repeat every 28 days. Start 01/04/2020 21 tablet 6  . traMADol (ULTRAM) 50 MG tablet Take 1 tablet (50 mg total) by mouth every 12 (twelve) hours as needed. 30 tablet 0   No current facility-administered medications for this visit.    OBJECTIVE: White woman who appears stated age  53:   06/20/20 1330  BP: (!) 156/73  Pulse: 100  Resp: 18  Temp: 98.4 F (36.9 C)  SpO2: 97%   Wt Readings from Last 3 Encounters:  06/20/20 117 lb 4.8 oz (53.2 kg)  05/22/20 118 lb 6.4 oz (53.7 kg)  05/15/20 120 lb (54.4 kg)   Body mass index is 22.16 kg/m.    ECOG FS:1 - Symptomatic but completely ambulatory  Sclerae unicteric, EOMs intact Wearing a mask No cervical or supraclavicular adenopathy Lungs no rales or rhonchi Heart regular rate and rhythm Abd soft, nontender, positive bowel sounds MSK no focal spinal tenderness, no upper extremity lymphedema Neuro: nonfocal, well oriented, tearful affect Breasts: Deferred   LAB RESULTS:  CMP     Component Value Date/Time   NA 141 06/20/2020 1241   NA 141 05/29/2017 1417   K 5.1 06/20/2020 1241   K 3.6 05/29/2017 1417   CL 105 06/20/2020 1241   CO2 30 06/20/2020 1241   CO2 27 05/29/2017 1417   GLUCOSE 104 (H) 06/20/2020 1241   GLUCOSE 111 05/29/2017 1417   BUN 17 06/20/2020 1241   BUN 17.0 05/29/2017 1417   CREATININE 0.74 06/20/2020 1241   CREATININE 0.66 03/05/2020 1218    CREATININE 0.8 05/29/2017 1417   CALCIUM 10.2 06/20/2020 1241   CALCIUM 10.7 (H) 06/26/2017 1216   CALCIUM 10.4 05/29/2017 1417   PROT 7.0 06/20/2020 1241   PROT 7.3 05/29/2017 1417  ALBUMIN 3.6 06/20/2020 1241   ALBUMIN 3.9 05/29/2017 1417   AST 18 06/20/2020 1241   AST 34 12/02/2019 1147   AST 17 05/29/2017 1417   ALT 13 06/20/2020 1241   ALT 30 12/02/2019 1147   ALT 15 05/29/2017 1417   ALKPHOS 41 06/20/2020 1241   ALKPHOS 42 05/29/2017 1417   BILITOT 0.3 06/20/2020 1241   BILITOT 0.4 12/02/2019 1147   BILITOT 0.30 05/29/2017 1417   GFRNONAA >60 06/20/2020 1241   GFRNONAA 83 03/05/2020 1218   GFRAA 97 03/05/2020 1218    INo results found for: SPEP, UPEP  Lab Results  Component Value Date   WBC 2.8 (L) 06/20/2020   NEUTROABS 1.3 (L) 06/20/2020   HGB 13.6 06/20/2020   HCT 39.6 06/20/2020   MCV 102.6 (H) 06/20/2020   PLT 166 06/20/2020      Chemistry      Component Value Date/Time   NA 141 06/20/2020 1241   NA 141 05/29/2017 1417   K 5.1 06/20/2020 1241   K 3.6 05/29/2017 1417   CL 105 06/20/2020 1241   CO2 30 06/20/2020 1241   CO2 27 05/29/2017 1417   BUN 17 06/20/2020 1241   BUN 17.0 05/29/2017 1417   CREATININE 0.74 06/20/2020 1241   CREATININE 0.66 03/05/2020 1218   CREATININE 0.8 05/29/2017 1417      Component Value Date/Time   CALCIUM 10.2 06/20/2020 1241   CALCIUM 10.7 (H) 06/26/2017 1216   CALCIUM 10.4 05/29/2017 1417   ALKPHOS 41 06/20/2020 1241   ALKPHOS 42 05/29/2017 1417   AST 18 06/20/2020 1241   AST 34 12/02/2019 1147   AST 17 05/29/2017 1417   ALT 13 06/20/2020 1241   ALT 30 12/02/2019 1147   ALT 15 05/29/2017 1417   BILITOT 0.3 06/20/2020 1241   BILITOT 0.4 12/02/2019 1147   BILITOT 0.30 05/29/2017 1417       Lab Results  Component Value Date   LABCA2 24 01/01/2016    No components found for: [id number]  No results for input(s): INR in the last 168 hours.  Urinalysis    Component Value Date/Time   COLORURINE YELLOW  03/05/2020 [place] 03/05/2020 1218   LABSPEC 1.006 03/05/2020 [place] 7.0 03/05/2020 [place] 03/05/2020 [place] 03/05/2020 [place] 09/19/2015 [place] 03/05/2020 [place] 03/05/2020 1218   NITRITE NEGATIVE 03/05/2020 1218   LEUKOCYTESUR 2+ (A) 03/05/2020 1218    STUDIES: No results found.   ASSESSMENT: 80 Rosales.o. [place] woman with stage IV left breast cancer involving bone  (1) status post right lumpectomy and axillary node dissection in 1985 followed by radiation at [place] 08/16/2014: measurable disease in spine, lung and left breast   (2) evaluation for left shoulder pain led to thoracic spine MRI 08/16/2014 showing a pathologic fracture at T3 with epidural tumor displacing the cord to the right, but no cord compression. CT scans of the chest, abdomen and pelvis 08/24/2014 showed in addition a mass in the upper outer quadrant left breast measuring 1.6 cm and a nodule in the minor fissure of the right lung measuring 1.2 cm, but no parenchymal lung or liver lesions.   (a) CA 27-29 was noninformative at 38 (09/20/2014)    (3) mammography and ultrasonography 08/29/2014 show a mass in the upper inner left breast which was palpable,  measuring 2.0 cm by ultrasound. Biopsy  of this mass 08/29/2014 showed an invasive breast cancer with both lobular and ductal features, estrogen receptor positive, progesterone receptor weakly positive, with an MIB-1 in the 40% range, HER-2 equivocal (6 else ratio 1.5, but average number her nucleus 5.8)   (4) letrozole started 08/31/2014;   (a) palbociclib added Sept 2016 at 75 mg 21/7, with significant neutropenia; not repeated after first cycle  (b) letrozole discontinued 05/29/2017 with evidence of disease progression in the breast   (5)  zolendronate started 09/20/2014, stopped after initial dose due to poor tolerance  (a)  denosumab/ Xgeva started 01/31/2015, repeated every 4 weeks  (b) changed to every 8 weeks as of August 2018.   (c) every 12 weeks recommended and ordered to start 01/2020   (6) on 10/20/2014 the patient underwent T2-T3 and T4 decompressive laminectomy with removal of epidural tumor, C7-T4 segmental pedicle screw instrumentation with virage screw system with arrow guidance protocol and C7-T4 posterolateral fusion. The cells were positive for the estrogen receptor. HER-2/neu testing by [facility] showed again equivocal results,  (a) most recent bone scan 10/01/2016 finds no new lesions only postoperative changes  (7) radiation 11/23/2014-12/11/2014.  (a) T1-T5 was treated to 30 Gy in 12 fractions at 2.5 Gy per fraction   (8) initiated close follow-up while considering eventual left lumpectomy or mastectomy depending on  longer-term results of systemic therapy  (a) most recent left breast ultrasonography 10/30/2016 found this mass to measure 0.7 cm  (b) repeat left breast ultrasonography 05/13/2017 showed the upper outer quadrant mass to have grown to 1.4 cm  (c) left breast ultrasound 09/08/2017 shows the mass to now measure 0.9 cm  (d) left breast biopsy x2 on 12/11/2017 shows high-grade ductal carcinoma in situ, essentially estrogen receptor negative  (9) genetics testing using the Breast/Ovarian Cancer Panel through [name], MD) found no deleterious mutations in ATM, BARD1, BRCA1, BRCA2, BRIP1, CDH1, CHEK2, EPCAM, FANCC, MLH1, MSH2, MSH6, NBN, PALB2, PMS2, PTEN, RAD51C, RAD51D, STK11, TP53, or XRCC2    (10) right thyroid nodule is a complex cyst as noted on CT scan of the neck 01/29/2016  (11) osteoporosis: [facility] density at [facility] 09/12/2016 showed a T score of -4.8.  (a) switched to Prolia starting 03/29/2020  (12) fulvestrant started 05/29/2017, last dose 07/22/2018 (discontinued due to pandemic).  (a) started palbociclib 06/29/2017 at 75 mg every other day for 21 days on, 7 days  off  (b) palbociclib discontinued on 3/29 due to progressive fatigue and patient preference  (13)  was to start abemaciclib 02/04/2018, but patient opted for going back to palbociclib at a lower dose beginning in November 19, patient subsequently declined s palbociclib, and  proceeded to surgery  (14) status post left lumpectomy 05/03/2018 showing a pT1b pNX invasive ductal carcinoma, grade 2, with equivocal HER-2 results  (a) opted against adjuvant radiation given presence of stage IV disease  (15) tamoxifen supposed to have been started started 09/13/2018 as a "bridge" pending resumption of fulvestrant/denosumab, but never started by the patient  (16) PET scan 11/02/2018 showed no active disease  (a) [place] scan on 10/31/2019 shows activity in 2 liver lesions, no active bone or lung lesions  (b) abdominal ultrasound 11/08/2019 confirms 2 liver lesions measuring 4.4 and 2.1 cm.  (c) biopsy of 1 of the liver lesions 12/12/2019 confirmed metastatic breast cancer, estrogen and progesterone receptor positive, with an MIB-1 of 10%, and HER-2 positive, with a copy number of 2.12 and the number per cell 6.25  (d) abdominal ultrasound 03/12/2020 shows  one of the liver lesions to have increased, while the second lesion has decreased  (17) letrozole started 02/15/2019, discontinued 12/02/2019 with evidence of progression  (a) bone density 09/12/2016 showed a T score of -4.8.  (18) fulvestrant resumed 12/08/2019  (a) palbociclib added at 75 mg daily 21/7 starting 12/20/2019   (19) trastuzumab started 04/25/2020, repeat every 28 days  (a) echo 03/12/2020 shows an ejection fraction in the 60-65% range  (b) changed to subcu formulation of trastuzumab 06/20/2020   PLAN: April Rosales continues to be depressed and overwhelmed.  She tells me she is sure if she took April Rosales her cancer would be cured.  We discussed the fact that there is no better treatment available than the treatment she is receiving and that  no one knows how to cure her stage IV breast cancer in 2022.  I am hopeful she will tolerate the subcutaneous form of trastuzumab well.  Her last echo was in October but she has only received trastuzumab twice.  I think it would be reasonable to consider an echo sometime in March.  I am not sure why she has had a little bit of epistaxis and some gum bleeding.  She is on a full dose of aspirin and I suggested she stop that.  We will continue to follow that as well as other issues at the next visit  Total encounter time 30 minutes.Sarajane Jews C. Lynnmarie Lovett, MD 06/20/20 5:40 PM Medical Oncology and Hematology [facility] [place], [place] [address] Tel. [phone]    Fax. [phone]   I, Wilburn Mylar, am acting as scribe for Dr. [name]. Daymion Nazaire.  I, Lurline Del MD, have reviewed the above documentation for accuracy and completeness, and I agree with the above.     *Total Encounter Time as defined by the Centers for Medicare and Medicaid Services includes, in addition to the face-to-face time of a patient visit (documented in the note above) non-face-to-face time: obtaining and reviewing outside history, ordering and reviewing medications, tests or procedures, care coordination (communications with other health care professionals or caregivers) and documentation in the medical record.

## 2020-06-20 NOTE — Patient Instructions (Signed)
[facility] Discharge Instructions for Patients Receiving Chemotherapy  Today you received the following chemotherapy agents: Herceptin Hylecta  To help prevent nausea and vomiting after your treatment, we encourage you to take your nausea medication as directed.   If you develop nausea and vomiting that is not controlled by your nausea medication, call the clinic.   BELOW ARE SYMPTOMS THAT SHOULD BE REPORTED IMMEDIATELY:  *FEVER GREATER THAN 100.5 F  *CHILLS WITH OR WITHOUT FEVER  NAUSEA AND VOMITING THAT IS NOT CONTROLLED WITH YOUR NAUSEA MEDICATION  *UNUSUAL SHORTNESS OF BREATH  *UNUSUAL BRUISING OR BLEEDING  TENDERNESS IN MOUTH AND THROAT WITH OR WITHOUT PRESENCE OF ULCERS  *URINARY PROBLEMS  *BOWEL PROBLEMS  UNUSUAL RASH Items with * indicate a potential emergency and should be followed up as soon as possible.  Feel free to call the clinic should you have any questions or concerns. The clinic phone number is [phone].  Please show the CHEMO ALERT CARD at check-in to the Emergency Department and triage nurse.  Trastuzumab; Hyaluronidase injection What is this medicine? TRASTUZUMAB; HYALURONIDASE (tras TOO zoo mab / hye al ur ON i dase) is used to treat breast cancer and stomach cancer. Trastuzumab is a monoclonal antibody. Hyaluronidase is used to improve the effects of trastuzumab. This medicine may be used for other purposes; ask your health care provider or pharmacist if you have questions. COMMON BRAND NAME(S): HERCEPTIN HYLECTA What should I tell my health care provider before I take this medicine? They need to know if you have any of these conditions:  heart disease  heart failure  lung or breathing disease, like asthma  an unusual or allergic reaction to trastuzumab, or other medications, foods, dyes, or preservatives  pregnant or trying to get pregnant  breast-feeding How should I use this medicine? This medicine is for injection  under the skin. It is given by a health care professional in a hospital or clinic setting. Talk to your pediatrician regarding the use of this medicine in children. This medicine is not approved for use in children. Overdosage: If you think you have taken too much of this medicine contact a poison control center or emergency room at once. NOTE: This medicine is only for you. Do not share this medicine with others. What if I miss a dose? It is important not to miss a dose. Call your doctor or health care professional if you are unable to keep an appointment. What may interact with this medicine? This medicine may interact with the following medications:  certain types of chemotherapy, such as daunorubicin, doxorubicin, epirubicin, and idarubicin This list may not describe all possible interactions. Give your health care provider a list of all the medicines, herbs, non-prescription drugs, or dietary supplements you use. Also tell them if you smoke, drink alcohol, or use illegal drugs. Some items may interact with your medicine. What should I watch for while using this medicine? Visit your doctor for checks on your progress. Report any side effects. Continue your course of treatment even though you feel ill unless your doctor tells you to stop. Call your doctor or health care professional for advice if you get a fever, chills or sore throat, or other symptoms of a cold or flu. Do not treat yourself. Try to avoid being around people who are sick. You may experience fever, chills and shaking during your first infusion. These effects are usually mild and can be treated with other medicines. Report any side effects during the infusion   to your health care professional. Fever and chills usually do not happen with later infusions. Do not become pregnant while taking this medicine or for 7 months after stopping it. Women should inform their doctor if they wish to become pregnant or think they might be pregnant.  Women of child-bearing potential will need to have a negative pregnancy test before starting this medicine. There is a potential for serious side effects to an unborn child. Talk to your health care professional or pharmacist for more information. Do not breast-feed an infant while taking this medicine or for 7 months after stopping it. What side effects may I notice from receiving this medicine? Side effects that you should report to your doctor or health care professional as soon as possible:  allergic reactions like skin rash, itching or hives, swelling of the face, lips, or tongue  breathing problems  chest pain or palpitations  cough  fever  general ill feeling or flu-like symptoms  signs of worsening heart failure like breathing problems; swelling in your legs and feet Side effects that usually do not require medical attention (report these to your doctor or health care professional if they continue or are bothersome):  bone pain  changes in taste  diarrhea  joint pain  nausea/vomiting  unusually weak or tired  weight loss This list may not describe all possible side effects. Call your doctor for medical advice about side effects. You may report side effects to FDA at 1-800-FDA-1088. Where should I keep my medicine? This drug is given in a hospital or clinic and will not be stored at home. NOTE: This sheet is a summary. It may not cover all possible information. If you have questions about this medicine, talk to your doctor, pharmacist, or health care provider.  2021 [employer] Standard (2017-07-31 21:54:17)  

## 2020-06-21 MED FILL — IBRANCE 75 MG TABS: 75 | 28 days supply | Qty: 21 | Fill #6

## 2020-07-16 ENCOUNTER — Other Ambulatory Visit: Payer: Self-pay | Admitting: Oncology

## 2020-07-18 ENCOUNTER — Other Ambulatory Visit: Payer: Self-pay

## 2020-07-18 ENCOUNTER — Inpatient Hospital Stay: Payer: Medicare Other | Attending: Oncology

## 2020-07-18 ENCOUNTER — Inpatient Hospital Stay (HOSPITAL_BASED_OUTPATIENT_CLINIC_OR_DEPARTMENT_OTHER): Payer: Medicare Other | Admitting: Oncology

## 2020-07-18 ENCOUNTER — Inpatient Hospital Stay: Payer: Medicare Other

## 2020-07-18 VITALS — HR 99

## 2020-07-18 VITALS — BP 142/76 | HR 106 | Temp 97.9°F | Resp 18 | Ht 61.0 in | Wt 118.6 lb

## 2020-07-18 DIAGNOSIS — Z170 Estrogen receptor positive status [ER+]: Secondary | ICD-10-CM

## 2020-07-18 DIAGNOSIS — Z5111 Encounter for antineoplastic chemotherapy: Secondary | ICD-10-CM | POA: Diagnosis not present

## 2020-07-18 DIAGNOSIS — C50412 Malignant neoplasm of upper-outer quadrant of left female breast: Secondary | ICD-10-CM

## 2020-07-18 DIAGNOSIS — M810 Age-related osteoporosis without current pathological fracture: Secondary | ICD-10-CM

## 2020-07-18 DIAGNOSIS — C50919 Malignant neoplasm of unspecified site of unspecified female breast: Secondary | ICD-10-CM

## 2020-07-18 DIAGNOSIS — C7951 Secondary malignant neoplasm of bone: Secondary | ICD-10-CM

## 2020-07-18 DIAGNOSIS — Z5112 Encounter for antineoplastic immunotherapy: Secondary | ICD-10-CM | POA: Diagnosis not present

## 2020-07-18 LAB — COMPREHENSIVE METABOLIC PANEL
ALT: 16 U/L (ref 0–44)
AST: 22 U/L (ref 15–41)
Albumin: 3.6 g/dL (ref 3.5–5.0)
Alkaline Phosphatase: 42 U/L (ref 38–126)
Anion gap: 7 (ref 5–15)
BUN: 15 mg/dL (ref 8–23)
CO2: 28 mmol/L (ref 22–32)
Calcium: 9.9 mg/dL (ref 8.9–10.3)
Chloride: 104 mmol/L (ref 98–111)
Creatinine, Ser: 0.77 mg/dL (ref 0.44–1.00)
GFR, Estimated: >60 mL/min (ref >60)
Glucose, Bld: 114 mg/dL — ABNORMAL HIGH (ref 70–99)
Potassium: 4.5 mmol/L (ref 3.5–5.1)
Sodium: 139 mmol/L (ref 135–145)
Total Bilirubin: 0.4 mg/dL (ref 0.3–1.2)
Total Protein: 7 g/dL (ref 6.5–8.1)

## 2020-07-18 LAB — CBC WITH DIFFERENTIAL/PLATELET
Abs Immature Granulocytes: 0.01 10*3/uL (ref 0.00–0.07)
Basophils Absolute: 0.1 10*3/uL (ref 0.0–0.1)
Basophils Relative: 2 %
Eosinophils Absolute: 0.1 10*3/uL (ref 0.0–0.5)
Eosinophils Relative: 3 %
HCT: 38.9 % (ref 36.0–46.0)
Hemoglobin: 13.3 g/dL (ref 12.0–15.0)
Immature Granulocytes: 0 %
Lymphocytes Relative: 41 %
Lymphs Abs: 1.3 10*3/uL (ref 0.7–4.0)
MCH: 35.5 pg — ABNORMAL HIGH (ref 26.0–34.0)
MCHC: 34.2 g/dL (ref 30.0–36.0)
MCV: 103.7 fL — ABNORMAL HIGH (ref 80.0–100.0)
Monocytes Absolute: 0.2 10*3/uL (ref 0.1–1.0)
Monocytes Relative: 5 %
Neutro Abs: 1.6 10*3/uL — ABNORMAL LOW (ref 1.7–7.7)
Neutrophils Relative %: 49 %
Platelets: 154 10*3/uL (ref 150–400)
RBC: 3.75 MIL/uL — ABNORMAL LOW (ref 3.87–5.11)
RDW: 14.6 % (ref 11.5–15.5)
WBC: 3.2 10*3/uL — ABNORMAL LOW (ref 4.0–10.5)
nRBC: 0 % (ref 0.0–0.2)

## 2020-07-18 MED ORDER — ACETAMINOPHEN 325 MG PO TABS
ORAL_TABLET | ORAL | Status: AC
  Filled 2020-07-18: qty 2

## 2020-07-18 MED ORDER — FULVESTRANT 250 MG/5ML IM SOLN
500.0000 mg | Freq: Once | INTRAMUSCULAR | Status: AC
  Administered 2020-07-18: 500 mg via INTRAMUSCULAR

## 2020-07-18 MED ORDER — ACETAMINOPHEN 325 MG PO TABS
650.0000 mg | ORAL_TABLET | Freq: Once | ORAL | Status: AC
  Administered 2020-07-18: 650 mg via ORAL

## 2020-07-18 MED ORDER — TRASTUZUMAB-HYALURONIDASE-OYSK 600-10000 MG-UNT/5ML ~~LOC~~ SOLN
600.0000 mg | Freq: Once | SUBCUTANEOUS | Status: AC
  Administered 2020-07-18: 600 mg via SUBCUTANEOUS
  Filled 2020-07-18: qty 5

## 2020-07-18 MED ORDER — FULVESTRANT 250 MG/5ML IM SOLN
INTRAMUSCULAR | Status: AC
  Filled 2020-07-18: qty 5

## 2020-07-18 NOTE — Patient Instructions (Signed)
[facility] Discharge Instructions for Patients Receiving Chemotherapy  Today you received the following chemotherapy agents: Herceptin Hylecta  To help prevent nausea and vomiting after your treatment, we encourage you to take your nausea medication as directed.   If you develop nausea and vomiting that is not controlled by your nausea medication, call the clinic.   BELOW ARE SYMPTOMS THAT SHOULD BE REPORTED IMMEDIATELY:  *FEVER GREATER THAN 100.5 F  *CHILLS WITH OR WITHOUT FEVER  NAUSEA AND VOMITING THAT IS NOT CONTROLLED WITH YOUR NAUSEA MEDICATION  *UNUSUAL SHORTNESS OF BREATH  *UNUSUAL BRUISING OR BLEEDING  TENDERNESS IN MOUTH AND THROAT WITH OR WITHOUT PRESENCE OF ULCERS  *URINARY PROBLEMS  *BOWEL PROBLEMS  UNUSUAL RASH Items with * indicate a potential emergency and should be followed up as soon as possible.  Feel free to call the clinic should you have any questions or concerns. The clinic phone number is [phone].  Please show the [place] at check-in to the Emergency Department and triage nurse.  Trastuzumab; Hyaluronidase injection What is this medicine? TRASTUZUMAB; HYALURONIDASE (tras TOO zoo mab / hye al ur ON i dase) is used to treat breast cancer and stomach cancer. Trastuzumab is a monoclonal antibody. Hyaluronidase is used to improve the effects of trastuzumab. This medicine may be used for other purposes; ask your health care provider or pharmacist if you have questions. COMMON BRAND NAME(S): HERCEPTIN HYLECTA What should I tell my health care provider before I take this medicine? They need to know if you have any of these conditions:  heart disease  heart failure  lung or breathing disease, like asthma  an unusual or allergic reaction to trastuzumab, or other medications, foods, dyes, or preservatives  pregnant or trying to get pregnant  breast-feeding How should I use this medicine? This medicine is for injection  under the skin. It is given by a health care professional in a hospital or clinic setting. Talk to your pediatrician regarding the use of this medicine in children. This medicine is not approved for use in children. Overdosage: If you think you have taken too much of this medicine contact a poison control center or emergency room at once. NOTE: This medicine is only for you. Do not share this medicine with others. What if I miss a dose? It is important not to miss a dose. Call your doctor or health care professional if you are unable to keep an appointment. What may interact with this medicine? This medicine may interact with the following medications:  certain types of chemotherapy, such as daunorubicin, doxorubicin, epirubicin, and idarubicin This list may not describe all possible interactions. Give your health care provider a list of all the medicines, herbs, non-prescription drugs, or dietary supplements you use. Also tell them if you smoke, drink alcohol, or use illegal drugs. Some items may interact with your medicine. What should I watch for while using this medicine? Visit your doctor for checks on your progress. Report any side effects. Continue your course of treatment even though you feel ill unless your doctor tells you to stop. Call your doctor or health care professional for advice if you get a fever, chills or sore throat, or other symptoms of a cold or flu. Do not treat yourself. Try to avoid being around people who are sick. You may experience fever, chills and shaking during your first infusion. These effects are usually mild and can be treated with other medicines. Report any side effects during the infusion  to your health care professional. Fever and chills usually do not happen with later infusions. Do not become pregnant while taking this medicine or for 7 months after stopping it. Women should inform their doctor if they wish to become pregnant or think they might be pregnant.  Women of child-bearing potential will need to have a negative pregnancy test before starting this medicine. There is a potential for serious side effects to an unborn child. Talk to your health care professional or pharmacist for more information. Do not breast-feed an infant while taking this medicine or for 7 months after stopping it. What side effects may I notice from receiving this medicine? Side effects that you should report to your doctor or health care professional as soon as possible:  allergic reactions like skin rash, itching or hives, swelling of the face, lips, or tongue  breathing problems  chest pain or palpitations  cough  fever  general ill feeling or flu-like symptoms  signs of worsening heart failure like breathing problems; swelling in your legs and feet Side effects that usually do not require medical attention (report these to your doctor or health care professional if they continue or are bothersome):  bone pain  changes in taste  diarrhea  joint pain  nausea/vomiting  unusually weak or tired  weight loss This list may not describe all possible side effects. Call your doctor for medical advice about side effects. You may report side effects to FDA at 1-800-FDA-1088. Where should I keep my medicine? This drug is given in a hospital or clinic and will not be stored at home. NOTE: This sheet is a summary. It may not cover all possible information. If you have questions about this medicine, talk to your doctor, pharmacist, or health care provider.  2021 [employer] Standard (2017-07-31 21:54:17)

## 2020-07-18 NOTE — Progress Notes (Signed)
April Rosales  [phone] [phone]    ID: April Rosales DOB: 1940-10-22  MR#: [id number]  ENI#:[id number]  Patient Care Team: Unk Rosales, April Rosales, April Dad, April as Consulting Physician (Oncology) [name], April as Consulting Physician (Orthopedic Surgery) [name], April as Consulting Physician (General Surgery) [name], April as Consulting Physician (Cardiology) OTHER: [name], DDS   CHIEF COMPLAINT: Stage IV estrogen receptor positive breast cancer  CURRENT TREATMENT: Fulvestrant, palbociclib; denosumab/Prolia; trastuzumabsubQ every 4 weeks   INTERVAL HISTORY: April Rosales is here today for follow up of her metastatic breast cancer unaccompanied today.  April Rosales continues on Fulvestrant every 4 weeks, together with palbociclib 23m daily 3 weeks on and 1 week off.  She tolerates these treatments Rosales and reports no problems with unusual fatigue nausea or other complications.  She does report diarrhea every morning, very scant, but very loose.  She tells me she is trying to drink as much as possible but suspects she is not drinking as much as she needs to.  We have switched her to denosumab/Prolia every 6 months for osteoporosis.  She started this on 03/28/2020.  Next dose will be due in May.  She received 1 dose of trastuzumab which she tolerated poorly.  We have changed that from IV to subcu and hope that she will do better with this dosing form  Most recent echocardiogram was October 2021. This needs to be repeated within the next month   REVIEW OF SYSTEMS:  [name] feels Rosales and today in particular looks better to me than she has for a while.  She is not going out and exercising because it has been too cold she says.  She cooks her own meals which are mostly TV dinners and canned vegetables.  She says her hair is thinning a little bit.  She feels cold all the time but her  temperature is normal.  She has had a couple of itchy spots that she has used peroxide on.  Sometimes she has a little bit of bleeding from her nose when she blows it too hard and sometimes in the morning after the diarrhea she has a little bit of blood on the tissue.  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 She tells me she had more than 30 lymph nodes removed from her right axilla and all of them were clear. She received  adjuvant radiation but no systemic treatment.  The patient had recently refused mammography with the last mammogram I can find dating back to August 2010.  More recently the patient presented with left scapular pain radiating down the left arm.  She was evaluated by Dr. YLorin Mercy who obtained a chest x-ray showing a possible abnormality at T3. He then set up the patient for an MRI of the thoracic spine performed 08/16/2014.  This showed multiple compression fractures  (a similar picture had been noted on lumbar MRI 10/09/2011 ). However at T3 they noted tumor in the vertebral body extending into the left pedicle and into the lateral epidural space, displacing the cord to the right. There was no evidence of cord compression or cord signal abnormality. There were no other areas of tumor identified in  the thoracic spine.         The patient was then referred to Dr. Hal Neer who on 08/24/2014 set April Rosales up for CT scans of the chest, abdomen and pelvis. There was a dense mass in the upper inner quadrant of the left breast measuring 1.6 cm. There was a 1.2 cm nodule in the minor fissure of the right lung and some evidence of right lung fibrosis at the site of the prior radiation port.  There was also a thyroid mass measuring 2 cm. However there were  no parenchymal lung or liver lesions. Incidental meningoceles were noted as Rosales as sclerosis of the fifth and sixth ribs which were felt to be likely posttraumatic.   On 08/29/2014 the patient underwent bilateral diagnostic mammography with tomosynthesis and left breast ultrasonography.  There were postsurgical changes in the upper right breast. In the left breast there was an irregular mass measuring 2.3 cm in the upper inner quadrant. This was palpable. Ultrasound showed this to be hypoechoic and to measure 2.0 cm. There were adjacent areas of nodularity. There was no definite lymphadenopathy in the left axilla.  Biopsy of this breast mass 08/29/2014 showed an invasive adenocarcinoma with both ductal and lobular features (there was strong diffuse E-cadherin expression as Rosales as areas with total absence of E-cadherin expression), with the preliminary prognostic profile showing strong estrogen positivity, very weak to near absent progesterone positivity, an MIB-1 of approximately 40%, and HER-2 equivocal  The patient's subsequent history is as detailed below.   PAST MEDICAL HISTORY: Past Medical History:  Diagnosis Date  . Arthritis   . Breast cancer [facility]) 1985/2016   takes [name] daily  . Chronic back pain    stenosis/listhesis  . Family history of ovarian cancer   . Osteoporosis    takes Vit D  . Radiation 11/23/14-12/11/14   30 Gy T1-T5     PAST SURGICAL HISTORY: Past Surgical History:  Procedure Laterality Date  . ABDOMINAL HYSTERECTOMY     1980  . APPENDECTOMY  1977  . APPLICATION OF INTRAOPERATIVE CT SCAN N/A 10/20/2014   Procedure: APPLICATION OF INTRAOPERATIVE CAT SCAN;  Surgeon: [name], April;  Location: [place] [facility];  Service: Neurosurgery;  Laterality: N/A;  . BREAST LUMPECTOMY WITH RADIOACTIVE SEED LOCALIZATION Left 05/03/2018   Procedure: LEFT BREAST LUMPECTOMY WITH BRACKETED RADIOACTIVE SEED LOCALIZATION;  Surgeon: [name], April;  Location: [facility];  Service: General;  Laterality: Left;  . BREAST SURGERY Right 1985  . THYROID CYST EXCISION  1967    FAMILY HISTORY Family History  Problem Relation Age of Onset  . Heart disease Mother   . Hypertension Mother   . Heart disease Father   . Diabetes Father   . Breast cancer Paternal Aunt        4 paternal aunts with breast cancer over 56  . Prostate cancer Paternal Uncle   . Stroke Paternal Grandfather   . Ovarian cancer Paternal Aunt   . Huntington's disease Other        Nephew, inherited from his father  The patient's father died from a heart attack at the age of 47. He had 9 sisters. 3 of those sisters had breast cancer, all in a menopausal setting. Another sister had ovarian cancer. One of the paternal uncles had cancer of the colon "and back".  The patient's mother died at the age of 59. She was found to have breast cancer shortly before dying , during her final hospitalization.  GYNECOLOGIC HISTORY:  No LMP recorded. Patient has had a hysterectomy.  Menarche age 14, first live birth age 35, the patient is GX P1. She underwent hysterectomy in 1980. She thinks the ovaries were removed, but the CT scan obtained 08/24/2014 showed a definite right ovary. The left ovary may have been removed. She did not take hormone replacement after the hysterectomy.   SOCIAL HISTORY:   Sloan worked in Psychiatric nurse. She is divorced, lives alone, with 2 cats. Her son Bethann Berkshire lives in [place] where he works in Engineer, technical sales. He has 4 children of his own. The patient is a Psychologist, forensic.   ADVANCED DIRECTIVES:  In place. The patient has named her sister Pleas Koch [phone]) and her friend Janina Mayo [phone]) as joint healthcare powers of attorney   HEALTH MAINTENANCE: Social History   Tobacco Use  . Smoking status: Former Smoker    Packs/day: 0.50    Years: 10.00    Pack years: 5.00    Types: Cigarettes    Quit date: 05/03/1970    Years since quitting: 50.2  .  Smokeless tobacco: Former Systems developer  . Tobacco comment: quit smoking 1970's  Substance Use Topics  . Alcohol use: Yes    Alcohol/week: 0.0 standard drinks    Comment: Rare  . Drug use: No     Colonoscopy: never  PAP: status post hysterectomy  Bone density: 09/12/2016 [facility]/ T score of -4.8 osteoporosis  Lipid panel:  Allergies  Allergen Reactions  . Ativan [Lorazepam]     Made her crazy  . Dilaudid [Hydromorphone Hcl]     Doesn't want  . Haldol [Haloperidol Lactate]     Made her crazy    Current Outpatient Medications  Medication Sig Dispense Refill  . Ascorbic Acid (VITAMIN C PO) Take 1 tablet by mouth every other day. Takes qod    . Cholecalciferol (VITAMIN D3 ADULT GUMMIES PO) Take 2,000 Units by mouth daily.     . Cyanocobalamin (VITAMIN B-12 PO) Take by mouth daily. Takes every other day    . IBRANCE 75 MG tablet TAKE 1 TABLET (75 MG TOTAL) BY MOUTH DAILY. TAKE FOR 21 DAYS ON, 7 DAYS OFF, REPEAT EVERY 28 DAYS. START 01/04/2020 21 tablet 6  . Iron-Vitamins (GERITOL PO) Take by mouth daily.    Marland Kitchen OVER THE COUNTER MEDICATION OTC Apple Cider Vinegar 1 capsule daily.    . traMADol (ULTRAM) 50 MG tablet Take 1 tablet (50 mg total) by mouth every 12 (twelve) hours as needed. 30 tablet 0   No current facility-administered medications for this visit.    OBJECTIVE: White woman in no acute distress  Vitals:   07/18/20 1310  BP: (!) 142/76  Pulse: (!) 106  Resp: 18  Temp: 97.9 F (36.6 C)  SpO2: 95%   Wt Readings from Last 3 Encounters:  07/18/20 118 lb 9.6 oz (53.8 kg)  06/20/20 117 lb 4.8 oz (53.2 kg)  05/22/20 118 lb 6.4 oz (53.7 kg)   Body mass index is 22.41 kg/m.    ECOG FS:1 - Symptomatic but completely ambulatory  Sclerae unicteric, EOMs intact Wearing a mask No cervical or supraclavicular adenopathy Lungs no rales or rhonchi Heart regular rate and rhythm Abd soft, nontender, positive bowel sounds MSK severe kyphosis but no focal spinal tenderness, no upper  extremity lymphedema Neuro: nonfocal, Rosales oriented, appropriate affect Breasts: Deferred   LAB RESULTS:  CMP     Component Value Date/Time   NA 139 07/18/2020 1258   NA 141 05/29/2017  1417   K 4.5 07/18/2020 1258   K 3.6 05/29/2017 1417   CL 104 07/18/2020 1258   CO2 28 07/18/2020 1258   CO2 27 05/29/2017 1417   GLUCOSE 114 (H) 07/18/2020 1258   GLUCOSE 111 05/29/2017 1417   BUN 15 07/18/2020 1258   BUN 17.0 05/29/2017 1417   CREATININE 0.77 07/18/2020 1258   CREATININE 0.66 03/05/2020 1218   CREATININE 0.8 05/29/2017 1417   CALCIUM 9.9 07/18/2020 1258   CALCIUM 10.7 (H) 06/26/2017 1216   CALCIUM 10.4 05/29/2017 1417   PROT 7.0 07/18/2020 1258   PROT 7.3 05/29/2017 1417   ALBUMIN 3.6 07/18/2020 1258   ALBUMIN 3.9 05/29/2017 1417   AST 22 07/18/2020 1258   AST 34 12/02/2019 1147   AST 17 05/29/2017 1417   ALT 16 07/18/2020 1258   ALT 30 12/02/2019 1147   ALT 15 05/29/2017 1417   ALKPHOS 42 07/18/2020 1258   ALKPHOS 42 05/29/2017 1417   BILITOT 0.4 07/18/2020 1258   BILITOT 0.4 12/02/2019 1147   BILITOT 0.30 05/29/2017 1417   GFRNONAA >60 07/18/2020 1258   GFRNONAA 83 03/05/2020 1218   GFRAA 97 03/05/2020 1218    INo results found for: SPEP, UPEP  Lab Results  Component Value Date   WBC 3.2 (L) 07/18/2020   NEUTROABS 1.6 (L) 07/18/2020   HGB 13.3 07/18/2020   HCT 38.9 07/18/2020   MCV 103.7 (H) 07/18/2020   PLT 154 07/18/2020      Chemistry      Component Value Date/Time   NA 139 07/18/2020 1258   NA 141 05/29/2017 1417   K 4.5 07/18/2020 1258   K 3.6 05/29/2017 1417   CL 104 07/18/2020 1258   CO2 28 07/18/2020 1258   CO2 27 05/29/2017 1417   BUN 15 07/18/2020 1258   BUN 17.0 05/29/2017 1417   CREATININE 0.77 07/18/2020 1258   CREATININE 0.66 03/05/2020 1218   CREATININE 0.8 05/29/2017 1417      Component Value Date/Time   CALCIUM 9.9 07/18/2020 1258   CALCIUM 10.7 (H) 06/26/2017 1216   CALCIUM 10.4 05/29/2017 1417   ALKPHOS 42 07/18/2020  1258   ALKPHOS 42 05/29/2017 1417   AST 22 07/18/2020 1258   AST 34 12/02/2019 1147   AST 17 05/29/2017 1417   ALT 16 07/18/2020 1258   ALT 30 12/02/2019 1147   ALT 15 05/29/2017 1417   BILITOT 0.4 07/18/2020 1258   BILITOT 0.4 12/02/2019 1147   BILITOT 0.30 05/29/2017 1417       Lab Results  Component Value Date   LABCA2 24 01/01/2016    No components found for: [id number]  No results for input(s): INR in the last 168 hours.  Urinalysis    Component Value Date/Time   COLORURINE YELLOW 03/05/2020 [place] 03/05/2020 1218   LABSPEC 1.006 03/05/2020 [place] 7.0 03/05/2020 [place] 03/05/2020 [place] 03/05/2020 [place] 09/19/2015 [place] 03/05/2020 [place] 03/05/2020 1218   NITRITE NEGATIVE 03/05/2020 1218   LEUKOCYTESUR 2+ (A) 03/05/2020 1218    STUDIES: No results found.   ASSESSMENT: 80 y.o. [place] woman with stage IV left breast cancer involving bone  (1) status post right lumpectomy and axillary node dissection in 1985 followed by radiation at [facility] 08/16/2014: measurable disease in spine, lung and left breast   (2) evaluation for left shoulder pain  led to thoracic spine MRI 08/16/2014 showing a pathologic fracture at T3 with epidural tumor displacing the cord to the right, but no cord compression. CT scans of the chest, abdomen and pelvis 08/24/2014 showed in addition a mass in the upper outer quadrant left breast measuring 1.6 cm and a nodule in the minor fissure of the right lung measuring 1.2 cm, but no parenchymal lung or liver lesions.   (a) CA 27-29 was noninformative at 38 (09/20/2014)    (3) mammography and ultrasonography 08/29/2014 show a mass in the upper inner left breast which was palpable,  measuring 2.0 cm by ultrasound. Biopsy of this mass 08/29/2014 showed an invasive breast cancer with both lobular and ductal  features, estrogen receptor positive, progesterone receptor weakly positive, with an MIB-1 in the 40% range, HER-2 equivocal (6 else ratio 1.5, but average number her nucleus 5.8)   (4) letrozole started 08/31/2014;   (a) palbociclib added Sept 2016 at 75 mg 21/7, with significant neutropenia; not repeated after first cycle  (b) letrozole discontinued 05/29/2017 with evidence of disease progression in the breast   (5)  zolendronate started 09/20/2014, stopped after initial dose due to poor tolerance  (a) denosumab/ Xgeva started 01/31/2015, repeated every 4 weeks  (b) changed to every 8 weeks as of August 2018.   (c) every 12 weeks recommended and ordered to start 01/2020   (6) on 10/20/2014 the patient underwent T2-T3 and T4 decompressive laminectomy with removal of epidural tumor, C7-T4 segmental pedicle screw instrumentation with virage screw system with arrow guidance protocol and C7-T4 posterolateral fusion. The cells were positive for the estrogen receptor. HER-2/neu testing by [facility] showed again equivocal results,  (a) most recent bone scan 10/01/2016 finds no new lesions only postoperative changes  (7) radiation 11/23/2014-12/11/2014.  (a) T1-T5 was treated to 30 Gy in 12 fractions at 2.5 Gy per fraction   (8) initiated close follow-up while considering eventual left lumpectomy or mastectomy depending on  longer-term results of systemic therapy  (a) most recent left breast ultrasonography 10/30/2016 found this mass to measure 0.7 cm  (b) repeat left breast ultrasonography 05/13/2017 showed the upper outer quadrant mass to have grown to 1.4 cm  (c) left breast ultrasound 09/08/2017 shows the mass to now measure 0.9 cm  (d) left breast biopsy x2 on 12/11/2017 shows high-grade ductal carcinoma in situ, essentially estrogen receptor negative  (9) genetics testing using the Breast/Ovarian Cancer Panel through [facility] [name], April) found no deleterious mutations in ATM, BARD1, BRCA1, BRCA2,  BRIP1, CDH1, CHEK2, EPCAM, FANCC, MLH1, MSH2, MSH6, NBN, PALB2, PMS2, PTEN, RAD51C, RAD51D, STK11, TP53, or XRCC2    (10) right thyroid nodule is a complex cyst as noted on CT scan of the neck 01/29/2016  (11) osteoporosis: [facility] density at [facility] 09/12/2016 showed a T score of -4.8.  (a) switched to Prolia starting 03/29/2020  (12) fulvestrant started 05/29/2017, last dose 07/22/2018 (discontinued due to pandemic).  (a) started palbociclib 06/29/2017 at 75 mg every other day for 21 days on, 7 days off  (b) palbociclib discontinued on 3/29 due to progressive fatigue and patient preference  (13)  was to start abemaciclib 02/04/2018, but patient opted for going back to palbociclib at a lower dose beginning in November 19, patient subsequently declined s palbociclib, and  proceeded to surgery  (14) status post left lumpectomy 05/03/2018 showing a pT1b pNX invasive ductal carcinoma, grade 2, with equivocal HER-2 results  (a) opted against adjuvant radiation given presence of stage IV disease  (15)  tamoxifen supposed to have been started started 09/13/2018 as a "bridge" pending resumption of fulvestrant/denosumab, but never started by the patient  (16) PET scan 11/02/2018 showed no active disease  (a) [place]ianna scan on 10/31/2019 shows activity in 2 liver lesions, no active bone or lung lesions  (b) abdominal ultrasound 11/08/2019 confirms 2 liver lesions measuring 4.4 and 2.1 cm.  (c) biopsy of 1 of the liver lesions 12/12/2019 confirmed metastatic breast cancer, estrogen and progesterone receptor positive, with an MIB-1 of 10%, and HER-2 positive, with a copy number of 2.12 and the number per cell 6.25  (d) abdominal ultrasound 03/12/2020 shows one of the liver lesions to have increased, while the second lesion has decreased  (17) letrozole started 02/15/2019, discontinued 12/02/2019 with evidence of progression  (a) bone density 09/12/2016 showed a T score of -4.8.  (18) fulvestrant resumed  12/08/2019  (a) palbociclib added at 75 mg daily 21/7 starting 12/20/2019   (19) trastuzumab started 04/25/2020, repeat every 28 days  (a) echo 03/12/2020 shows an ejection fraction in the 60-65% range  (b) changed to subcu formulation of trastuzumab 06/20/2020   PLAN: Taeko is feeling better.  I am not sure what has made her to in the corner but I am delighted to see her functioning as Rosales as she has.  She is tolerating treatment Rosales.  I am going to restage her with an ultrasound of the liver sometime in the next week or 2.  She is also behind on her echocardiography and I have also entered those orders.  She will return to see me in 4 weeks and we will review those results then  She knows to call for any other issue that may develop before that visit  Total encounter time today 25 minutes.   April Rosales. Magrinat, April 07/18/20 2:03 PM Medical Oncology and Hematology [facility] [place], [place] [address] Tel. [phone]    Fax. [phone]   I, Wilburn Mylar, am acting as scribe for Dr. Virgie Rosales. Rosales.  I, Lurline Del April, have reviewed the above documentation for accuracy and completeness, and I agree with the above.   *Total Encounter Time as defined by the Centers for Medicare and Medicaid Services includes, in addition to the face-to-face time of a patient visit (documented in the note above) non-face-to-face time: obtaining and reviewing outside history, ordering and reviewing medications, tests or procedures, care coordination (communications with other health care professionals or caregivers) and documentation in the medical record.

## 2020-07-19 ENCOUNTER — Telehealth: Payer: Self-pay | Admitting: Oncology

## 2020-07-19 MED FILL — IBRANCE 75 MG TABS: 75 | 28 days supply | Qty: 21 | Fill #0

## 2020-07-19 NOTE — Telephone Encounter (Signed)
Scheduled appts per 2/23 los. Pt confirmed appt date and time.  

## 2020-07-27 ENCOUNTER — Other Ambulatory Visit: Payer: Self-pay

## 2020-07-27 ENCOUNTER — Ambulatory Visit (HOSPITAL_COMMUNITY)
Admission: RE | Admit: 2020-07-27 | Discharge: 2020-07-27 | Disposition: A | Discharge status: Home / Self Care | Payer: Medicare Other | Source: Ambulatory Visit | Attending: Oncology | Admitting: Oncology

## 2020-07-27 DIAGNOSIS — C7951 Secondary malignant neoplasm of bone: Secondary | ICD-10-CM | POA: Diagnosis not present

## 2020-07-27 DIAGNOSIS — C787 Secondary malignant neoplasm of liver and intrahepatic bile duct: Secondary | ICD-10-CM | POA: Diagnosis not present

## 2020-07-27 DIAGNOSIS — C50412 Malignant neoplasm of upper-outer quadrant of left female breast: Secondary | ICD-10-CM | POA: Diagnosis not present

## 2020-07-27 DIAGNOSIS — Z170 Estrogen receptor positive status [ER+]: Secondary | ICD-10-CM | POA: Diagnosis not present

## 2020-07-30 ENCOUNTER — Other Ambulatory Visit: Payer: Self-pay

## 2020-07-30 ENCOUNTER — Other Ambulatory Visit: Payer: Self-pay | Admitting: Oncology

## 2020-07-30 ENCOUNTER — Ambulatory Visit (HOSPITAL_COMMUNITY)
Admission: RE | Admit: 2020-07-30 | Discharge: 2020-07-30 | Disposition: A | Discharge status: Home / Self Care | Payer: Medicare Other | Source: Ambulatory Visit | Attending: Oncology | Admitting: Oncology

## 2020-07-30 DIAGNOSIS — C7951 Secondary malignant neoplasm of bone: Secondary | ICD-10-CM | POA: Diagnosis not present

## 2020-07-30 DIAGNOSIS — I358 Other nonrheumatic aortic valve disorders: Secondary | ICD-10-CM

## 2020-07-30 DIAGNOSIS — Z170 Estrogen receptor positive status [ER+]: Secondary | ICD-10-CM | POA: Diagnosis not present

## 2020-07-30 DIAGNOSIS — C50412 Malignant neoplasm of upper-outer quadrant of left female breast: Secondary | ICD-10-CM

## 2020-07-30 DIAGNOSIS — I371 Nonrheumatic pulmonary valve insufficiency: Secondary | ICD-10-CM

## 2020-07-30 DIAGNOSIS — Z01810 Encounter for preprocedural cardiovascular examination: Secondary | ICD-10-CM | POA: Insufficient documentation

## 2020-07-30 LAB — ECHOCARDIOGRAM COMPLETE
Area-P 1/2: 4.21 cm2
S' Lateral: 2.2 cm

## 2020-07-30 NOTE — Progress Notes (Signed)
I called April Rosales and gave her the results of her ultrasound which show one lesion clearly better.  The other lesion looks a little bit larger but on the other hand it is dying from the inside out.  I think very likely we will not make any significant changes in her treatment and restage in a couple of months.  She was very pleased with this plan.

## 2020-07-30 NOTE — Progress Notes (Signed)
  Echocardiogram 2D Echocardiogram has been performed.  Jennette Dubin 07/30/2020, 10:56 AM

## 2020-08-15 NOTE — Progress Notes (Signed)
April Rosales  [phone] [phone]    ID: April TOBEY DOB: 12/02/1940  MR#: [id number]  JYN#:[id number]  Patient Care Team: [name], MD as PCP - General (Internal Medicine) Elizet Kaplan, [name], MD as Consulting Physician (Oncology) [name], MD as Consulting Physician (Orthopedic Surgery) [name], MD as Consulting Physician (General Surgery) [name], MD as Consulting Physician (Cardiology) OTHER: [name], DDS   CHIEF COMPLAINT: Stage IV estrogen receptor positive breast cancer  CURRENT TREATMENT: Fulvestrant, palbociclib; denosumab/Prolia every 6 months; trastuzumab subQ every 4 weeks   INTERVAL HISTORY: April Rosales is here today for follow up of her metastatic breast cancer unaccompanied today.  April Rosales continues on Fulvestrant every 4 weeks, together with palbociclib 79m daily 3 weeks on and 1 week off.  She is currently completing her third week of the current palbociclib cycle.  She tolerates these treatments well and reports no problems with unusual fatigue nausea or other complications.    We have switched her to denosumab/Prolia every 6 months for osteoporosis.  She started this on 03/28/2020.  Next dose will be due in May.  She received 1 dose of intravenous trastuzumab which she tolerated poorly.  We have changed that from IV to subQ and she tolerates that much better with no side effects that she is aware of.  Since her last visit, she underwent right upper quadrant ultrasound on 07/27/2020 showing: known metastases, the larger has decreased in size in interval (now 4.7 cm, previously 5.6 cm), the smaller has increased in interval (now 2.5 cm, previously 1.7 cm).  This is the opposite pattern of what we saw last October on the liver ultrasound  She also underwent repeat echocardiogram on 07/30/2020 showing an ejection fraction of 60-65%.   REVIEW OF SYSTEMS:  CLanisagenerally feels well.  She is doing  quite a bit of reading.  She does not exercise regularly.  She is having some allergy issues including some watering of the left eye and a runny nose.  She has loose bowel movements likely from the [place]  She takes Pepto-Bismol occasionally.  She does not use Imodium or Questran.  A detailed review of systems today was otherwise stable   COVID 19 VACCINATION STATUS: PTulsax2, most recently 11/2019   BREAST CANCER HISTORY: From the original intake note:   [facility] like [facility]. She tells me she had more than 30 lymph nodes removed from her right axilla and all of them were clear. She received  adjuvant radiation but no systemic treatment.  The patient had recently refused mammography with the last mammogram I can find dating back to August 2010.  More recently the patient presented with left scapular pain radiating down the left arm.  She was evaluated by Dr. YLorin Mercy who obtained a chest x-ray showing a possible abnormality at T3. He then set up the patient for an MRI of the thoracic spine performed 08/16/2014.  This showed multiple compression fractures  (a similar picture had been noted on lumbar MRI 10/09/2011 ). However at T3 they noted tumor in the vertebral body extending into the left pedicle and into the lateral epidural space, displacing the cord to the right. There was no evidence of cord compression or cord signal abnormality. There were no other areas of tumor identified in the thoracic spine.         The patient was then referred  to Dr. Hal Neer who on 08/24/2014 set April Rosales up for CT scans of the chest, abdomen and pelvis. There was a dense mass in the upper inner quadrant of the left breast measuring 1.6 cm. There was a 1.2 cm nodule in the minor fissure of the right lung and some evidence of right lung fibrosis at the site of the prior radiation port.  There was also a thyroid mass measuring 2  cm. However there were no parenchymal lung or liver lesions. Incidental meningoceles were noted as well as sclerosis of the fifth and sixth ribs which were felt to be likely posttraumatic.   On 08/29/2014 the patient underwent bilateral diagnostic mammography with tomosynthesis and left breast ultrasonography.  There were postsurgical changes in the upper right breast. In the left breast there was an irregular mass measuring 2.3 cm in the upper inner quadrant. This was palpable. Ultrasound showed this to be hypoechoic and to measure 2.0 cm. There were adjacent areas of nodularity. There was no definite lymphadenopathy in the left axilla.  Biopsy of this breast mass 08/29/2014 showed an invasive adenocarcinoma with both ductal and lobular features (there was strong diffuse E-cadherin expression as well as areas with total absence of E-cadherin expression), with the preliminary prognostic profile showing strong estrogen positivity, very weak to near absent progesterone positivity, an MIB-1 of approximately 40%, and HER-2 equivocal  The patient's subsequent history is as detailed below.   PAST MEDICAL HISTORY: Past Medical History:  Diagnosis Date  . Arthritis   . Breast cancer [facility]) 1985/2016   takes [name] daily  . Chronic back pain    stenosis/listhesis  . Family history of ovarian cancer   . Osteoporosis    takes Vit D  . Radiation 11/23/14-12/11/14   30 Gy T1-T5     PAST SURGICAL HISTORY: Past Surgical History:  Procedure Laterality Date  . ABDOMINAL HYSTERECTOMY     1980  . APPENDECTOMY  1977  . APPLICATION OF INTRAOPERATIVE CT SCAN N/A 10/20/2014   Procedure: APPLICATION OF INTRAOPERATIVE CAT SCAN;  Surgeon: [name], MD;  Location: [place];  Service: Neurosurgery;  Laterality: N/A;  . BREAST LUMPECTOMY WITH RADIOACTIVE SEED LOCALIZATION Left 05/03/2018   Procedure: LEFT BREAST LUMPECTOMY WITH BRACKETED RADIOACTIVE SEED LOCALIZATION;  Surgeon: [name], MD;   Location: [place];  Service: General;  Laterality: Left;  . BREAST SURGERY Right 1985  . THYROID CYST EXCISION  1967    FAMILY HISTORY Family History  Problem Relation Age of Onset  . Heart disease Mother   . Hypertension Mother   . Heart disease Father   . Diabetes Father   . Breast cancer Paternal Aunt        4 paternal aunts with breast cancer over 24  . Prostate cancer Paternal Uncle   . Stroke Paternal Grandfather   . Ovarian cancer Paternal Aunt   . Huntington's disease Other        Nephew, inherited from his father  The patient's father died from a heart attack at the age of 44. He had 9 sisters. 3 of those sisters had breast cancer, all in a menopausal setting. Another sister had ovarian cancer. One of the paternal uncles had cancer of the colon "and back".  The patient's mother died at the age of 53. She was found to have breast cancer shortly before dying , during her final hospitalization.   GYNECOLOGIC HISTORY:  No LMP recorded. Patient has had a hysterectomy.  Menarche age 61, first  live birth age 73, the patient is GX P1. She underwent hysterectomy in 1980. She thinks the ovaries were removed, but the CT scan obtained 08/24/2014 showed a definite right ovary. The left ovary may have been removed. She did not take hormone replacement after the hysterectomy.   SOCIAL HISTORY:   Shirline worked in Psychiatric nurse. She is divorced, lives alone, with 2 cats. Her son Bethann Berkshire lives in [place] where he works in Engineer, technical sales. He has 4 children of his own. The patient is a Psychologist, forensic.   ADVANCED DIRECTIVES:  In place. The patient has named her sister Pleas Koch [phone]) and her friend Janina Mayo [phone]) as joint healthcare powers of attorney   HEALTH MAINTENANCE: Social History   Tobacco Use  . Smoking status: Former Smoker    Packs/day: 0.50    Years: 10.00    Pack years: 5.00    Types: Cigarettes    Quit date: 05/03/1970     Years since quitting: 50.3  . Smokeless tobacco: Former Systems developer  . Tobacco comment: quit smoking 1970's  Substance Use Topics  . Alcohol use: Yes    Alcohol/week: 0.0 standard drinks    Comment: Rare  . Drug use: No     Colonoscopy: never  PAP: status post hysterectomy  Bone density: 09/12/2016 [facility]/ T score of -4.8 osteoporosis  Lipid panel:  Allergies  Allergen Reactions  . Ativan [Lorazepam]     Made her crazy  . Dilaudid [Hydromorphone Hcl]     Doesn't want  . Haldol [Haloperidol Lactate]     Made her crazy    Current Outpatient Medications  Medication Sig Dispense Refill  . Ascorbic Acid (VITAMIN C PO) Take 1 tablet by mouth every other day. Takes qod    . Cholecalciferol (VITAMIN D3 ADULT GUMMIES PO) Take 2,000 Units by mouth daily.     . Cyanocobalamin (VITAMIN B-12 PO) Take by mouth daily. Takes every other day    . Iron-Vitamins (GERITOL PO) Take by mouth daily.    Marland Kitchen OVER THE COUNTER MEDICATION OTC Apple Cider Vinegar 1 capsule daily.    . palbociclib (IBRANCE) 75 MG tablet TAKE 1 TABLET (75 MG TOTAL) BY MOUTH DAILY. TAKE FOR 21 DAYS ON, 7 DAYS OFF, REPEAT EVERY 28 DAYS. START 01/04/2020 21 tablet 6  . traMADol (ULTRAM) 50 MG tablet Take 1 tablet (50 mg total) by mouth every 12 (twelve) hours as needed. 30 tablet 0   No current facility-administered medications for this visit.    OBJECTIVE: White woman in no acute distress  Vitals:   08/16/20 1220  BP: 130/62  Pulse: 100  Resp: 18  Temp: (!) 97.2 F (36.2 C)  SpO2: 98%   Wt Readings from Last 3 Encounters:  08/16/20 119 lb 1.6 oz (54 kg)  07/18/20 118 lb 9.6 oz (53.8 kg)  06/20/20 117 lb 4.8 oz (53.2 kg)   Body mass index is 22.5 kg/m.    ECOG FS:1 - Symptomatic but completely ambulatory  Sclerae unicteric, EOMs intact Wearing a mask No cervical or supraclavicular adenopathy Lungs no rales or rhonchi Heart regular rate and rhythm Abd soft, nontender, positive bowel sounds MSK kyphosis but no  focal spinal tenderness, no upper extremity lymphedema Neuro: nonfocal, well oriented, appropriate affect Breasts: Deferred   LAB RESULTS:  CMP     Component Value Date/Time   NA 139 07/18/2020 1258   NA 141 05/29/2017 1417   K 4.5 07/18/2020 1258   K 3.6 05/29/2017 1417   CL 104  07/18/2020 1258   CO2 28 07/18/2020 1258   CO2 27 05/29/2017 1417   GLUCOSE 114 (H) 07/18/2020 1258   GLUCOSE 111 05/29/2017 1417   BUN 15 07/18/2020 1258   BUN 17.0 05/29/2017 1417   CREATININE 0.77 07/18/2020 1258   CREATININE 0.66 03/05/2020 1218   CREATININE 0.8 05/29/2017 1417   CALCIUM 9.9 07/18/2020 1258   CALCIUM 10.7 (H) 06/26/2017 1216   CALCIUM 10.4 05/29/2017 1417   PROT 7.0 07/18/2020 1258   PROT 7.3 05/29/2017 1417   ALBUMIN 3.6 07/18/2020 1258   ALBUMIN 3.9 05/29/2017 1417   AST 22 07/18/2020 1258   AST 34 12/02/2019 1147   AST 17 05/29/2017 1417   ALT 16 07/18/2020 1258   ALT 30 12/02/2019 1147   ALT 15 05/29/2017 1417   ALKPHOS 42 07/18/2020 1258   ALKPHOS 42 05/29/2017 1417   BILITOT 0.4 07/18/2020 1258   BILITOT 0.4 12/02/2019 1147   BILITOT 0.30 05/29/2017 1417   GFRNONAA >60 07/18/2020 1258   GFRNONAA 83 03/05/2020 1218   GFRAA 97 03/05/2020 1218    INo results found for: SPEP, UPEP  Lab Results  Component Value Date   WBC 3.2 (L) 07/18/2020   NEUTROABS 1.6 (L) 07/18/2020   HGB 13.3 07/18/2020   HCT 38.9 07/18/2020   MCV 103.7 (H) 07/18/2020   PLT 154 07/18/2020      Chemistry      Component Value Date/Time   NA 139 07/18/2020 1258   NA 141 05/29/2017 1417   K 4.5 07/18/2020 1258   K 3.6 05/29/2017 1417   CL 104 07/18/2020 1258   CO2 28 07/18/2020 1258   CO2 27 05/29/2017 1417   BUN 15 07/18/2020 1258   BUN 17.0 05/29/2017 1417   CREATININE 0.77 07/18/2020 1258   CREATININE 0.66 03/05/2020 1218   CREATININE 0.8 05/29/2017 1417      Component Value Date/Time   CALCIUM 9.9 07/18/2020 1258   CALCIUM 10.7 (H) 06/26/2017 1216   CALCIUM 10.4  05/29/2017 1417   ALKPHOS 42 07/18/2020 1258   ALKPHOS 42 05/29/2017 1417   AST 22 07/18/2020 1258   AST 34 12/02/2019 1147   AST 17 05/29/2017 1417   ALT 16 07/18/2020 1258   ALT 30 12/02/2019 1147   ALT 15 05/29/2017 1417   BILITOT 0.4 07/18/2020 1258   BILITOT 0.4 12/02/2019 1147   BILITOT 0.30 05/29/2017 1417       Lab Results  Component Value Date   LABCA2 24 01/01/2016    No components found for: [id number]  No results for input(s): INR in the last 168 hours.  Urinalysis    Component Value Date/Time   COLORURINE YELLOW 03/05/2020 [place] 03/05/2020 1218   LABSPEC 1.006 03/05/2020 1218   PHURINE 7.0 03/05/2020 [place] 03/05/2020 [place] 03/05/2020 [place] 09/19/2015 [place] 03/05/2020 [place] 03/05/2020 1218   NITRITE NEGATIVE 03/05/2020 1218   LEUKOCYTESUR 2+ (A) 03/05/2020 1218    STUDIES: ECHOCARDIOGRAM COMPLETE  Result Date: 07/30/2020    ECHOCARDIOGRAM REPORT   Patient Name:   TANZA PELLOT Date of Exam: 07/30/2020 Medical Rec #:  [id number]        Height:       61.0 in Accession #:    [id number]       Weight:       118.6 lb Date of Birth:  1940/10/22  BSA:          1.513 m Patient Age:    52 years         BP:           142/76 mmHg Patient Gender: F                HR:           99 bpm. Exam Location:  Outpatient Procedure: 2D Echo Indications:    Chemo Z09  History:        Patient has prior history of Echocardiogram examinations, most                 recent 03/12/2020.  Sonographer:    Mikki Santee RDCS (AE) Referring Phys: [place]  1. Left ventricular ejection fraction, by estimation, is 60 to 65%. The left ventricle has normal function. The left ventricle has no regional wall motion abnormalities. Left ventricular diastolic parameters are consistent with Grade I diastolic dysfunction (impaired relaxation). The average left  ventricular global longitudinal strain is 20.7 %. The global longitudinal strain is normal.  2. Right ventricular systolic function is normal. The right ventricular size is normal.  3. The mitral valve is grossly normal. Trivial mitral valve regurgitation.  4. The aortic valve is tricuspid. Aortic valve regurgitation is not visualized. Comparison(s): No significant change from prior study. 03/12/2020: LVEF 60-65%, grade 1 DD. FINDINGS  Left Ventricle: Left ventricular ejection fraction, by estimation, is 60 to 65%. The left ventricle has normal function. The left ventricle has no regional wall motion abnormalities. The average left ventricular global longitudinal strain is 20.7 %. The  global longitudinal strain is normal. The left ventricular internal cavity size was normal in size. There is no left ventricular hypertrophy. Left ventricular diastolic parameters are consistent with Grade I diastolic dysfunction (impaired relaxation). Indeterminate filling pressures. Right Ventricle: The right ventricular size is normal. No increase in right ventricular wall thickness. Right ventricular systolic function is normal. Left Atrium: Left atrial size was normal in size. Right Atrium: Right atrial size was normal in size. Pericardium: There is no evidence of pericardial effusion. Mitral Valve: The mitral valve is grossly normal. Trivial mitral valve regurgitation. Tricuspid Valve: The tricuspid valve is not well visualized. Tricuspid valve regurgitation is not demonstrated. Aortic Valve: The aortic valve is tricuspid. Aortic valve regurgitation is not visualized. Pulmonic Valve: The pulmonic valve was grossly normal. Pulmonic valve regurgitation is mild. Aorta: The aortic root and ascending aorta are structurally normal, with no evidence of dilitation. IAS/Shunts: No atrial level shunt detected by color flow Doppler.  LEFT VENTRICLE PLAX 2D LVIDd:         3.10 cm  Diastology LVIDs:         2.20 cm  LV e' medial:    5.78  cm/s LV PW:         0.80 cm  LV E/e' medial:  12.2 LV IVS:        0.80 cm  LV e' lateral:   6.83 cm/s LVOT diam:     2.10 cm  LV E/e' lateral: 10.3 LV SV:         57 LV SV Index:   38       2D Longitudinal Strain LVOT Area:     3.46 cm 2D Strain GLS Avg:     20.7 %  RIGHT VENTRICLE TAPSE (M-mode): 1.8 cm LEFT ATRIUM  Index       RIGHT ATRIUM          Index LA diam:        2.40 cm 1.59 cm/m  RA Area:     9.51 cm LA Vol (A2C):   35.0 ml 23.14 ml/m RA Volume:   16.60 ml 10.97 ml/m LA Vol (A4C):   25.5 ml 16.86 ml/m LA Biplane Vol: 30.7 ml 20.30 ml/m  AORTIC VALVE LVOT Vmax:   71.80 cm/s LVOT Vmean:  54.600 cm/s LVOT VTI:    0.164 m  AORTA Ao Root diam: 2.70 cm MITRAL VALVE MV Area (PHT): 4.21 cm     SHUNTS MV Decel Time: 180 msec     Systemic VTI:  0.16 m MV E velocity: 70.30 cm/s   Systemic Diam: 2.10 cm MV A velocity: 104.00 cm/s MV E/A ratio:  0.68 Zoila Shutter MD Electronically signed by Zoila Shutter MD Signature Date/Time: 07/30/2020/12:04:21 PM    Final    US ABDOMEN LIMITED RUQ (LIVER/GB)  Result Date: 07/28/2020 CLINICAL DATA:  Follow-up known metastasis EXAM: ULTRASOUND ABDOMEN LIMITED RIGHT UPPER QUADRANT COMPARISON:  None. FINDINGS: Gallbladder: Multiple stones seen in the gallbladder without wall thickening, pericholecystic fluid, or Murphy's sign. Common bile duct: Diameter: 3.0 mm Liver: Two masses are again seen in the liver. The larger in the right hepatic lobe measures 4.1 x 4.7 x 3.8 cm today versus 5.6 x 5.1 x 5.6 cm in October of 2021. The second mass is seen in the right hepatic lobe is well. This mass measures 2.3 x 2.3 x 2.5 cm today versus 1.5 x 0.9 x 1.7 cm previously. This mass demonstrates some central increased echogenicity today. Portal vein is patent on color Doppler imaging with normal direction of blood flow towards the liver. Other: None. IMPRESSION: 1. The patient has known metastases are again identified. The larger of the 2 metastases is smaller in the interval  measuring 4.1 x 4.7 x 3.8 cm today versus 5.6 x 5.1 x 5.6 cm previously. The smaller of the 2 metastases is larger in the interval measuring 2.3 x 2.3 x 2.5 cm today versus 1.5 x 0.9 x 1.7 cm previously. Electronically Signed   By: Gerome Sam III M.D   On: 07/28/2020 14:59     ASSESSMENT: 80 y.o. [place] woman with stage IV left breast cancer involving bone  (1) status post right lumpectomy and axillary node dissection in 1985 followed by radiation at [facility]  METASTATIC DISEASE 08/16/2014: measurable disease in spine, lung and left breast   (2) evaluation for left shoulder pain led to thoracic spine MRI 08/16/2014 showing a pathologic fracture at T3 with epidural tumor displacing the cord to the right, but no cord compression. CT scans of the chest, abdomen and pelvis 08/24/2014 showed in addition a mass in the upper outer quadrant left breast measuring 1.6 cm and a nodule in the minor fissure of the right lung measuring 1.2 cm, but no parenchymal lung or liver lesions.   (a) CA 27-29 was noninformative at 38 (09/20/2014)    (3) mammography and ultrasonography 08/29/2014 show a mass in the upper inner left breast which was palpable,  measuring 2.0 cm by ultrasound. Biopsy of this mass 08/29/2014 showed an invasive breast cancer with both lobular and ductal features, estrogen receptor positive, progesterone receptor weakly positive, with an MIB-1 in the 40% range, HER-2 equivocal (6 else ratio 1.5, but average number her nucleus 5.8)   (4) letrozole started 08/31/2014;   (a) palbociclib  added Sept 2016 at 75 mg 21/7, with significant neutropenia; not repeated after first cycle  (b) letrozole discontinued 05/29/2017 with evidence of disease progression in the breast   (5)  zolendronate started 09/20/2014, stopped after initial dose due to poor tolerance  (a) denosumab/ Xgeva started 01/31/2015, repeated every 4 weeks  (b) changed to every 8 weeks as of August 2018.   (c) every 12 weeks  recommended and ordered to start 01/2020   (6) on 10/20/2014 the patient underwent T2-T3 and T4 decompressive laminectomy with removal of epidural tumor, C7-T4 segmental pedicle screw instrumentation with virage screw system with arrow guidance protocol and C7-T4 posterolateral fusion. The cells were positive for the estrogen receptor. HER-2/neu testing by [facility] showed again equivocal results,  (a) most recent bone scan 10/01/2016 finds no new lesions only postoperative changes  (7) radiation 11/23/2014-12/11/2014.  (a) T1-T5 was treated to 30 Gy in 12 fractions at 2.5 Gy per fraction   (8) initiated close follow-up while considering eventual left lumpectomy or mastectomy depending on  longer-term results of systemic therapy  (a) most recent left breast ultrasonography 10/30/2016 found this mass to measure 0.7 cm  (b) repeat left breast ultrasonography 05/13/2017 showed the upper outer quadrant mass to have grown to 1.4 cm  (c) left breast ultrasound 09/08/2017 shows the mass to now measure 0.9 cm  (d) left breast biopsy x2 on 12/11/2017 shows high-grade ductal carcinoma in situ, essentially estrogen receptor negative  (9) genetics testing using the Breast/Ovarian Cancer Panel through [name], MD) found no deleterious mutations in ATM, BARD1, BRCA1, BRCA2, BRIP1, CDH1, CHEK2, EPCAM, FANCC, MLH1, MSH2, MSH6, NBN, PALB2, PMS2, PTEN, RAD51C, RAD51D, STK11, TP53, or XRCC2    (10) right thyroid nodule is a complex cyst as noted on CT scan of the neck 01/29/2016  (11) osteoporosis: Bone density at [facility] 09/12/2016 showed a T score of -4.8.  (a) switched to Prolia starting 03/29/2020  (12) fulvestrant started 05/29/2017, last dose 07/22/2018 (discontinued due to pandemic).  (a) started palbociclib 06/29/2017 at 75 mg every other day for 21 days on, 7 days off  (b) palbociclib discontinued on 3/29 due to progressive fatigue and patient preference  (13)  was to start abemaciclib 02/04/2018,  but patient opted for going back to palbociclib at a lower dose beginning in November 19, patient subsequently declined s palbociclib, and  proceeded to surgery  (14) status post left lumpectomy 05/03/2018 showing a pT1b pNX invasive ductal carcinoma, grade 2, with equivocal HER-2 results  (a) opted against adjuvant radiation given presence of stage IV disease  (15) tamoxifen supposed to have been started started 09/13/2018 as a "bridge" pending resumption of fulvestrant/denosumab, but never started by the patient  (16) PET scan 11/02/2018 showed no active disease  (a) [place] scan on 10/31/2019 shows activity in 2 liver lesions, no active bone or lung lesions  (b) abdominal ultrasound 11/08/2019 confirms 2 liver lesions measuring 4.4 and 2.1 cm.  (c) biopsy of 1 of the liver lesions 12/12/2019 confirmed metastatic breast cancer, estrogen and progesterone receptor positive, with an MIB-1 of 10%, and HER-2 positive, with a copy number of 2.12 and the number per cell 6.25  (d) abdominal ultrasound 03/12/2020 shows one of the liver lesions to have increased, while the second lesion has decreased  (17) letrozole started 02/15/2019, discontinued 12/02/2019 with evidence of progression  (a) bone density 09/12/2016 showed a T score of -4.8.  (18) fulvestrant resumed 12/08/2019  (a) palbociclib added at 75 mg daily 21/7 starting  12/20/2019   (19) trastuzumab started 04/25/2020, repeat every 28 days  (a) echo 03/12/2020 shows an ejection fraction in the 60-65% range  (b) changed to subcu formulation of trastuzumab 06/20/2020   PLAN: Venera is now 6 years out from definitive diagnosis of metastatic breast cancer.  She has no symptoms related to her disease.  She is tolerating treatment well.  Overall then we are continuing without making any major changes  She is going to receive the subcutaneous trastuzumab every 4 weeks so we can come side with her Faslodex treatments.  Also of course we check  her labs once a week because of the Ibrance cycles.  I am making no changes in her dose there either at this point.  We discussed the abdominal ultrasound which does show growth (less than a half an inch) on one of the liver lesions, but a good response in the other.  We are going to keep an eye out closely on this and she will have a repeat ultrasound in about 3 months.  She will also be due for repeat echo around that time  Overall at this point I am encouraged and she will see me again in 3 months  She knows to call for any other issue that may develop before the next visit.  Total encounter time 25 minutes.Sarajane Jews C. Pietrina Jagodzinski, MD 08/16/20 12:48 PM Medical Oncology and Hematology [facility] [place], [place] [address] Tel. [phone]    Fax. [phone]   I, Wilburn Mylar, am acting as scribe for Dr. [name]. Delta Deshmukh.  I, Lurline Del MD, have reviewed the above documentation for accuracy and completeness, and I agree with the above.   *Total Encounter Time as defined by the Centers for Medicare and Medicaid Services includes, in addition to the face-to-face time of a patient visit (documented in the note above) non-face-to-face time: obtaining and reviewing outside history, ordering and reviewing medications, tests or procedures, care coordination (communications with other health care professionals or caregivers) and documentation in the medical record.

## 2020-08-16 ENCOUNTER — Inpatient Hospital Stay: Payer: Medicare Other

## 2020-08-16 ENCOUNTER — Inpatient Hospital Stay (HOSPITAL_BASED_OUTPATIENT_CLINIC_OR_DEPARTMENT_OTHER): Payer: Medicare Other | Admitting: Oncology

## 2020-08-16 ENCOUNTER — Other Ambulatory Visit: Payer: Self-pay | Admitting: Oncology

## 2020-08-16 ENCOUNTER — Other Ambulatory Visit: Payer: Self-pay

## 2020-08-16 ENCOUNTER — Inpatient Hospital Stay: Payer: Medicare Other | Attending: Oncology

## 2020-08-16 VITALS — BP 130/62 | HR 100 | Temp 97.2°F | Resp 18 | Ht 61.0 in | Wt 119.1 lb

## 2020-08-16 DIAGNOSIS — Z5111 Encounter for antineoplastic chemotherapy: Secondary | ICD-10-CM | POA: Diagnosis not present

## 2020-08-16 DIAGNOSIS — C50919 Malignant neoplasm of unspecified site of unspecified female breast: Secondary | ICD-10-CM | POA: Insufficient documentation

## 2020-08-16 DIAGNOSIS — C50412 Malignant neoplasm of upper-outer quadrant of left female breast: Secondary | ICD-10-CM | POA: Diagnosis not present

## 2020-08-16 DIAGNOSIS — C7951 Secondary malignant neoplasm of bone: Secondary | ICD-10-CM

## 2020-08-16 DIAGNOSIS — Z5112 Encounter for antineoplastic immunotherapy: Secondary | ICD-10-CM | POA: Insufficient documentation

## 2020-08-16 DIAGNOSIS — Z170 Estrogen receptor positive status [ER+]: Secondary | ICD-10-CM | POA: Insufficient documentation

## 2020-08-16 DIAGNOSIS — M810 Age-related osteoporosis without current pathological fracture: Secondary | ICD-10-CM

## 2020-08-16 LAB — COMPREHENSIVE METABOLIC PANEL
ALT: 15 U/L (ref 0–44)
AST: 20 U/L (ref 15–41)
Albumin: 3.8 g/dL (ref 3.5–5.0)
Alkaline Phosphatase: 43 U/L (ref 38–126)
Anion gap: 10 (ref 5–15)
BUN: 16 mg/dL (ref 8–23)
CO2: 26 mmol/L (ref 22–32)
Calcium: 9.7 mg/dL (ref 8.9–10.3)
Chloride: 104 mmol/L (ref 98–111)
Creatinine, Ser: 0.75 mg/dL (ref 0.44–1.00)
GFR, Estimated: >60 mL/min (ref >60)
Glucose, Bld: 80 mg/dL (ref 70–99)
Potassium: 3.7 mmol/L (ref 3.5–5.1)
Sodium: 140 mmol/L (ref 135–145)
Total Bilirubin: 0.4 mg/dL (ref 0.3–1.2)
Total Protein: 7.4 g/dL (ref 6.5–8.1)

## 2020-08-16 LAB — CBC WITH DIFFERENTIAL/PLATELET
Abs Immature Granulocytes: 0.01 10*3/uL (ref 0.00–0.07)
Basophils Absolute: 0.1 10*3/uL (ref 0.0–0.1)
Basophils Relative: 2 %
Eosinophils Absolute: 0.1 10*3/uL (ref 0.0–0.5)
Eosinophils Relative: 3 %
HCT: 40.6 % (ref 36.0–46.0)
Hemoglobin: 13.9 g/dL (ref 12.0–15.0)
Immature Granulocytes: 0 %
Lymphocytes Relative: 44 %
Lymphs Abs: 1.5 10*3/uL (ref 0.7–4.0)
MCH: 35.6 pg — ABNORMAL HIGH (ref 26.0–34.0)
MCHC: 34.2 g/dL (ref 30.0–36.0)
MCV: 104.1 fL — ABNORMAL HIGH (ref 80.0–100.0)
Monocytes Absolute: 0.2 10*3/uL (ref 0.1–1.0)
Monocytes Relative: 6 %
Neutro Abs: 1.5 10*3/uL — ABNORMAL LOW (ref 1.7–7.7)
Neutrophils Relative %: 45 %
Platelets: 164 10*3/uL (ref 150–400)
RBC: 3.9 MIL/uL (ref 3.87–5.11)
RDW: 14.2 % (ref 11.5–15.5)
WBC: 3.3 10*3/uL — ABNORMAL LOW (ref 4.0–10.5)
nRBC: 0 % (ref 0.0–0.2)

## 2020-08-16 MED ORDER — ACETAMINOPHEN 325 MG PO TABS
ORAL_TABLET | ORAL | Status: AC
  Filled 2020-08-16: qty 2

## 2020-08-16 MED ORDER — TRASTUZUMAB-HYALURONIDASE-OYSK 600-10000 MG-UNT/5ML ~~LOC~~ SOLN
600.0000 mg | Freq: Once | SUBCUTANEOUS | Status: AC
  Administered 2020-08-16: 600 mg via SUBCUTANEOUS
  Filled 2020-08-16: qty 5

## 2020-08-16 MED ORDER — FULVESTRANT 250 MG/5ML IM SOLN
INTRAMUSCULAR | Status: AC
  Filled 2020-08-16: qty 10

## 2020-08-16 MED ORDER — ACETAMINOPHEN 325 MG PO TABS
650.0000 mg | ORAL_TABLET | Freq: Once | ORAL | Status: AC
  Administered 2020-08-16: 650 mg via ORAL

## 2020-08-16 MED ORDER — FULVESTRANT 250 MG/5ML IM SOLN
500.0000 mg | Freq: Once | INTRAMUSCULAR | Status: AC
  Administered 2020-08-16: 500 mg via INTRAMUSCULAR

## 2020-08-16 MED ORDER — PALBOCICLIB 75 MG PO TABS: ORAL_TABLET | ORAL | 6 refills | Status: DC

## 2020-08-16 NOTE — Patient Instructions (Signed)
[facility] Discharge Instructions for Patients Receiving Chemotherapy  Today you received the following chemotherapy agents: Herceptin Hylecta  Also received faslodex  To help prevent nausea and vomiting after your treatment, we encourage you to take your nausea medication as directed.   If you develop nausea and vomiting that is not controlled by your nausea medication, call the clinic.   BELOW ARE SYMPTOMS THAT SHOULD BE REPORTED IMMEDIATELY:  *FEVER GREATER THAN 100.5 F  *CHILLS WITH OR WITHOUT FEVER  NAUSEA AND VOMITING THAT IS NOT CONTROLLED WITH YOUR NAUSEA MEDICATION  *UNUSUAL SHORTNESS OF BREATH  *UNUSUAL BRUISING OR BLEEDING  TENDERNESS IN MOUTH AND THROAT WITH OR WITHOUT PRESENCE OF ULCERS  *URINARY PROBLEMS  *BOWEL PROBLEMS  UNUSUAL RASH Items with * indicate a potential emergency and should be followed up as soon as possible.  Feel free to call the clinic should you have any questions or concerns. The clinic phone number is [phone].  Please show the [place] at check-in to the Emergency Department and triage nurse.

## 2020-08-20 MED FILL — IBRANCE 75 MG TABS: 75 | 28 days supply | Qty: 21 | Fill #0

## 2020-08-22 ENCOUNTER — Encounter: Payer: Self-pay | Admitting: Internal Medicine

## 2020-08-22 ENCOUNTER — Ambulatory Visit (INDEPENDENT_AMBULATORY_CARE_PROVIDER_SITE_OTHER): Payer: Medicare Other | Admitting: Internal Medicine

## 2020-08-22 ENCOUNTER — Other Ambulatory Visit: Payer: Self-pay

## 2020-08-22 VITALS — BP 124/82 | HR 80 | Temp 97.5°F | Resp 16 | Ht 61.5 in | Wt 119.2 lb

## 2020-08-22 DIAGNOSIS — H0100B Unspecified blepharitis left eye, upper and lower eyelids: Secondary | ICD-10-CM

## 2020-08-22 MED ORDER — NEOMYCIN-POLYMYXIN-DEXAMETH 3.5-10000-0.1 OP SUSP: OPHTHALMIC | 2 refills | Status: DC

## 2020-08-22 NOTE — Progress Notes (Signed)
     History of Present Illness:      This very nice 80 y.o.  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 Denies discharge. Denies difficulties with vision. She does not have an eye dr of record.    Medications  .  traMADol (ULTRAM) 50 MG tablet, Take 1 tablet (50 mg total) by mouth every 12 (twelve) hours as needed.  .  Cyanocobalamin (VITAMIN B-12 PO), Take by mouth daily. Takes every other day  .  Ascorbic Acid (VITAMIN C PO), Take 1 tablet by mouth every other day. Takes qod  .  Cholecalciferol (VITAMIN D3 ADULT GUMMIES PO), Take 2,000 Units by mouth daily.   .  Iron-Vitamins (GERITOL PO), Take by mouth daily.  Marland Kitchen  OVER THE COUNTER MEDICATION, OTC Apple Cider Vinegar 1 capsule daily.  .  palbociclib (IBRANCE) 75 MG tablet, TAKE 1 TABLET (75 MG TOTAL) BY MOUTH DAILY. TAKE FOR 21 DAYS ON, 7 DAYS OFF, REPEAT EVERY 28 DAYS. 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   Observations/Objective:   BP 124/82   Pulse 80   Temp (!) 97.5 F (36.4 C)   Resp 16   Ht 5' 1.5" (1.562 m)   Wt 119 lb 3.2 oz (54.1 kg)   SpO2 93%   BMI 22.16 kg/m   HEENT - WNL except left upper & lower eye lids appear slightly reddened & puffy & tearing. No crusting or purulence.  Neck - supple.  Chest - Clear equal BS. Mod kyphosis Cor - Nl  HS. RRR w/o sig m. MS- FROM w/o deformities.  Gait Nl. Neuro -  Nl w/o focal abnormalities.  Assessment and Plan:  1. Blepharitis of both upper and lower eyelid of left eye, unspecified  - neomycin-polymyxin b-dexamethasone (MAXITROL) 3.5-10000-0.1 SUSP; Use  1 to 2 drops  to the affected eye  4 x /day  Dispense: 5 mL; Refill: 2   Follow Up Instructions:      I discussed the assessment and treatment plan with the patient. The patient was provided an opportunity to ask questions and all were answered. The patient agreed with the plan and demonstrated an understanding of the instructions.       The patient was advised to call back or seek an in-person evaluation if the symptoms worsen or if the condition fails to improve as anticipated.    Kirtland Bouchard, MD

## 2020-08-23 ENCOUNTER — Other Ambulatory Visit (HOSPITAL_COMMUNITY): Payer: Self-pay

## 2020-08-30 ENCOUNTER — Other Ambulatory Visit (HOSPITAL_COMMUNITY): Payer: Self-pay

## 2020-09-13 ENCOUNTER — Other Ambulatory Visit: Payer: Self-pay

## 2020-09-13 ENCOUNTER — Inpatient Hospital Stay: Payer: Medicare Other

## 2020-09-13 ENCOUNTER — Inpatient Hospital Stay: Payer: Medicare Other | Attending: Oncology

## 2020-09-13 VITALS — BP 147/84 | HR 77 | Temp 98.1°F | Resp 18

## 2020-09-13 DIAGNOSIS — Z5111 Encounter for antineoplastic chemotherapy: Secondary | ICD-10-CM | POA: Diagnosis not present

## 2020-09-13 DIAGNOSIS — Z170 Estrogen receptor positive status [ER+]: Secondary | ICD-10-CM | POA: Diagnosis not present

## 2020-09-13 DIAGNOSIS — C7951 Secondary malignant neoplasm of bone: Secondary | ICD-10-CM

## 2020-09-13 DIAGNOSIS — M810 Age-related osteoporosis without current pathological fracture: Secondary | ICD-10-CM

## 2020-09-13 DIAGNOSIS — C50919 Malignant neoplasm of unspecified site of unspecified female breast: Secondary | ICD-10-CM

## 2020-09-13 DIAGNOSIS — C50412 Malignant neoplasm of upper-outer quadrant of left female breast: Secondary | ICD-10-CM

## 2020-09-13 LAB — CBC WITH DIFFERENTIAL/PLATELET
Abs Immature Granulocytes: 0.01 10*3/uL (ref 0.00–0.07)
Basophils Absolute: 0.1 10*3/uL (ref 0.0–0.1)
Basophils Relative: 3 %
Eosinophils Absolute: 0.1 10*3/uL (ref 0.0–0.5)
Eosinophils Relative: 3 %
HCT: 40 % (ref 36.0–46.0)
Hemoglobin: 13.7 g/dL (ref 12.0–15.0)
Immature Granulocytes: 0 %
Lymphocytes Relative: 37 %
Lymphs Abs: 1.1 10*3/uL (ref 0.7–4.0)
MCH: 35.7 pg — ABNORMAL HIGH (ref 26.0–34.0)
MCHC: 34.3 g/dL (ref 30.0–36.0)
MCV: 104.2 fL — ABNORMAL HIGH (ref 80.0–100.0)
Monocytes Absolute: 0.2 10*3/uL (ref 0.1–1.0)
Monocytes Relative: 6 %
Neutro Abs: 1.5 10*3/uL — ABNORMAL LOW (ref 1.7–7.7)
Neutrophils Relative %: 51 %
Platelets: 157 10*3/uL (ref 150–400)
RBC: 3.84 MIL/uL — ABNORMAL LOW (ref 3.87–5.11)
RDW: 14.3 % (ref 11.5–15.5)
WBC: 2.9 10*3/uL — ABNORMAL LOW (ref 4.0–10.5)
nRBC: 0 % (ref 0.0–0.2)

## 2020-09-13 LAB — COMPREHENSIVE METABOLIC PANEL
ALT: 12 U/L (ref 0–44)
AST: 21 U/L (ref 15–41)
Albumin: 3.7 g/dL (ref 3.5–5.0)
Alkaline Phosphatase: 43 U/L (ref 38–126)
Anion gap: 7 (ref 5–15)
BUN: 15 mg/dL (ref 8–23)
CO2: 30 mmol/L (ref 22–32)
Calcium: 10.1 mg/dL (ref 8.9–10.3)
Chloride: 104 mmol/L (ref 98–111)
Creatinine, Ser: 0.77 mg/dL (ref 0.44–1.00)
GFR, Estimated: >60 mL/min (ref >60)
Glucose, Bld: 94 mg/dL (ref 70–99)
Potassium: 4.6 mmol/L (ref 3.5–5.1)
Sodium: 141 mmol/L (ref 135–145)
Total Bilirubin: 0.3 mg/dL (ref 0.3–1.2)
Total Protein: 7 g/dL (ref 6.5–8.1)

## 2020-09-13 MED ORDER — ACETAMINOPHEN 325 MG PO TABS
ORAL_TABLET | ORAL | Status: AC
  Filled 2020-09-13: qty 2

## 2020-09-13 MED ORDER — DENOSUMAB 60 MG/ML ~~LOC~~ SOSY
60.0000 mg | PREFILLED_SYRINGE | Freq: Once | SUBCUTANEOUS | Status: AC
  Administered 2020-09-13: 60 mg via SUBCUTANEOUS

## 2020-09-13 MED ORDER — ACETAMINOPHEN 325 MG PO TABS
650.0000 mg | ORAL_TABLET | Freq: Once | ORAL | Status: AC
Start: 2020-09-13 — End: 2020-09-13
  Administered 2020-09-13: 650 mg via ORAL

## 2020-09-13 MED ORDER — FULVESTRANT 250 MG/5ML IM SOLN
INTRAMUSCULAR | Status: AC
  Filled 2020-09-13: qty 10

## 2020-09-13 MED ORDER — DENOSUMAB 60 MG/ML ~~LOC~~ SOSY
PREFILLED_SYRINGE | SUBCUTANEOUS | Status: AC
  Filled 2020-09-13: qty 1

## 2020-09-13 MED ORDER — FULVESTRANT 250 MG/5ML IM SOLN
500.0000 mg | Freq: Once | INTRAMUSCULAR | Status: AC
  Administered 2020-09-13: 500 mg via INTRAMUSCULAR

## 2020-09-13 MED ORDER — TRASTUZUMAB-HYALURONIDASE-OYSK 600-10000 MG-UNT/5ML ~~LOC~~ SOLN
600.0000 mg | Freq: Once | SUBCUTANEOUS | Status: AC
  Administered 2020-09-13: 600 mg via SUBCUTANEOUS
  Filled 2020-09-13: qty 5

## 2020-09-13 NOTE — Patient Instructions (Signed)
[facility] Discharge Instructions for Patients Receiving Chemotherapy  Today you received the following chemotherapy agents herceptin hylecta   To help prevent nausea and vomiting after your treatment, we encourage you to take your nausea medication as directed.    If you develop nausea and vomiting that is not controlled by your nausea medication, call the clinic.   BELOW ARE SYMPTOMS THAT SHOULD BE REPORTED IMMEDIATELY:  *FEVER GREATER THAN 100.5 F  *CHILLS WITH OR WITHOUT FEVER  NAUSEA AND VOMITING THAT IS NOT CONTROLLED WITH YOUR NAUSEA MEDICATION  *UNUSUAL SHORTNESS OF BREATH  *UNUSUAL BRUISING OR BLEEDING  TENDERNESS IN MOUTH AND THROAT WITH OR WITHOUT PRESENCE OF ULCERS  *URINARY PROBLEMS  *BOWEL PROBLEMS  UNUSUAL RASH Items with * indicate a potential emergency and should be followed up as soon as possible.  Feel free to call the clinic should you have any questions or concerns. The clinic phone number is [phone].  Please show the CHEMO ALERT CARD at check-in to the Emergency Department and triage nurse.   

## 2020-09-17 ENCOUNTER — Other Ambulatory Visit (HOSPITAL_COMMUNITY): Payer: Self-pay

## 2020-09-17 MED FILL — Palbociclib Tab 75 MG: ORAL | 28 days supply | Qty: 21 | Fill #0 | Status: AC

## 2020-09-19 ENCOUNTER — Other Ambulatory Visit (HOSPITAL_COMMUNITY): Payer: Self-pay

## 2020-09-20 ENCOUNTER — Other Ambulatory Visit (HOSPITAL_COMMUNITY): Payer: Self-pay

## 2020-10-11 ENCOUNTER — Inpatient Hospital Stay: Payer: Medicare Other

## 2020-10-11 ENCOUNTER — Inpatient Hospital Stay: Payer: Medicare Other | Attending: Oncology

## 2020-10-11 ENCOUNTER — Other Ambulatory Visit: Payer: Self-pay

## 2020-10-11 VITALS — BP 139/70 | HR 93 | Temp 98.0°F | Resp 17 | Wt 120.0 lb

## 2020-10-11 DIAGNOSIS — C7951 Secondary malignant neoplasm of bone: Secondary | ICD-10-CM | POA: Insufficient documentation

## 2020-10-11 DIAGNOSIS — Z170 Estrogen receptor positive status [ER+]: Secondary | ICD-10-CM

## 2020-10-11 DIAGNOSIS — C50919 Malignant neoplasm of unspecified site of unspecified female breast: Secondary | ICD-10-CM

## 2020-10-11 DIAGNOSIS — Z5111 Encounter for antineoplastic chemotherapy: Secondary | ICD-10-CM | POA: Insufficient documentation

## 2020-10-11 DIAGNOSIS — C50412 Malignant neoplasm of upper-outer quadrant of left female breast: Secondary | ICD-10-CM

## 2020-10-11 DIAGNOSIS — M810 Age-related osteoporosis without current pathological fracture: Secondary | ICD-10-CM

## 2020-10-11 LAB — CBC WITH DIFFERENTIAL/PLATELET
Abs Immature Granulocytes: 0 10*3/uL (ref 0.00–0.07)
Basophils Absolute: 0.1 10*3/uL (ref 0.0–0.1)
Basophils Relative: 3 %
Eosinophils Absolute: 0.1 10*3/uL (ref 0.0–0.5)
Eosinophils Relative: 4 %
HCT: 38.6 % (ref 36.0–46.0)
Hemoglobin: 13.2 g/dL (ref 12.0–15.0)
Immature Granulocytes: 0 %
Lymphocytes Relative: 34 %
Lymphs Abs: 1 10*3/uL (ref 0.7–4.0)
MCH: 35.4 pg — ABNORMAL HIGH (ref 26.0–34.0)
MCHC: 34.2 g/dL (ref 30.0–36.0)
MCV: 103.5 fL — ABNORMAL HIGH (ref 80.0–100.0)
Monocytes Absolute: 0.2 10*3/uL (ref 0.1–1.0)
Monocytes Relative: 6 %
Neutro Abs: 1.5 10*3/uL — ABNORMAL LOW (ref 1.7–7.7)
Neutrophils Relative %: 53 %
Platelets: 155 10*3/uL (ref 150–400)
RBC: 3.73 MIL/uL — ABNORMAL LOW (ref 3.87–5.11)
RDW: 14.3 % (ref 11.5–15.5)
WBC: 2.9 10*3/uL — ABNORMAL LOW (ref 4.0–10.5)
nRBC: 0 % (ref 0.0–0.2)

## 2020-10-11 LAB — COMPREHENSIVE METABOLIC PANEL
ALT: 13 U/L (ref 0–44)
AST: 23 U/L (ref 15–41)
Albumin: 3.5 g/dL (ref 3.5–5.0)
Alkaline Phosphatase: 44 U/L (ref 38–126)
Anion gap: 8 (ref 5–15)
BUN: 16 mg/dL (ref 8–23)
CO2: 28 mmol/L (ref 22–32)
Calcium: 10 mg/dL (ref 8.9–10.3)
Chloride: 104 mmol/L (ref 98–111)
Creatinine, Ser: 0.74 mg/dL (ref 0.44–1.00)
GFR, Estimated: >60 mL/min (ref >60)
Glucose, Bld: 107 mg/dL — ABNORMAL HIGH (ref 70–99)
Potassium: 4.1 mmol/L (ref 3.5–5.1)
Sodium: 140 mmol/L (ref 135–145)
Total Bilirubin: 0.4 mg/dL (ref 0.3–1.2)
Total Protein: 6.8 g/dL (ref 6.5–8.1)

## 2020-10-11 MED ORDER — TRASTUZUMAB-HYALURONIDASE-OYSK 600-10000 MG-UNT/5ML ~~LOC~~ SOLN
600.0000 mg | Freq: Once | SUBCUTANEOUS | Status: AC
Start: 2020-10-11 — End: 2020-10-11
  Administered 2020-10-11: 600 mg via SUBCUTANEOUS
  Filled 2020-10-11: qty 5

## 2020-10-11 MED ORDER — ACETAMINOPHEN 325 MG PO TABS
ORAL_TABLET | ORAL | Status: AC
  Filled 2020-10-11: qty 2

## 2020-10-11 MED ORDER — ACETAMINOPHEN 325 MG PO TABS
650.0000 mg | ORAL_TABLET | Freq: Once | ORAL | Status: AC
  Administered 2020-10-11: 650 mg via ORAL

## 2020-10-11 MED ORDER — FULVESTRANT 250 MG/5ML IM SOLN
INTRAMUSCULAR | Status: AC
  Filled 2020-10-11: qty 10

## 2020-10-11 MED ORDER — FULVESTRANT 250 MG/5ML IM SOLN
500.0000 mg | Freq: Once | INTRAMUSCULAR | Status: AC
Start: 2020-10-11 — End: 2020-10-11
  Administered 2020-10-11: 500 mg via INTRAMUSCULAR

## 2020-10-11 NOTE — Patient Instructions (Signed)
[facility] MEDICAL ONCOLOGY  Discharge Instructions: Thank you for choosing [facility] to provide your oncology and hematology care.   If you have a lab appointment with the Cancer Center, please go directly to the Cancer Center and check in at the registration area.   Wear comfortable clothing and clothing appropriate for easy access to any Portacath or PICC line.   We strive to give you quality time with your provider. You may need to reschedule your appointment if you arrive late (15 or more minutes).  Arriving late affects you and other patients whose appointments are after yours.  Also, if you miss three or more appointments without notifying the office, you may be dismissed from the clinic at the provider's discretion.      For prescription refill requests, have your pharmacy contact our office and allow 72 hours for refills to be completed.    Today you received the following chemotherapy and/or immunotherapy agents: Herceptin Hylecta.     To help prevent nausea and vomiting after your treatment, we encourage you to take your nausea medication as directed.  BELOW ARE SYMPTOMS THAT SHOULD BE REPORTED IMMEDIATELY: . *FEVER GREATER THAN 100.4 F (38 C) OR HIGHER . *CHILLS OR SWEATING . *NAUSEA AND VOMITING THAT IS NOT CONTROLLED WITH YOUR NAUSEA MEDICATION . *UNUSUAL SHORTNESS OF BREATH . *UNUSUAL BRUISING OR BLEEDING . *URINARY PROBLEMS (pain or burning when urinating, or frequent urination) . *BOWEL PROBLEMS (unusual diarrhea, constipation, pain near the anus) . TENDERNESS IN MOUTH AND THROAT WITH OR WITHOUT PRESENCE OF ULCERS (sore throat, sores in mouth, or a toothache) . UNUSUAL RASH, SWELLING OR PAIN  . UNUSUAL VAGINAL DISCHARGE OR ITCHING   Items with * indicate a potential emergency and should be followed up as soon as possible or go to the Emergency Department if any problems should occur.  Please show the CHEMOTHERAPY ALERT CARD or  IMMUNOTHERAPY ALERT CARD at check-in to the Emergency Department and triage nurse.  Should you have questions after your visit or need to cancel or reschedule your appointment, please contact [facility]ICAL [facility]COL[facility]  Dept: [phone]  and follow the prompts.  Office hours are 8:00 a.m. to 4:30 p.m. Monday - Friday. Please note that voicemails left after 4:00 p.m. may not be returned until the following business day.  We are closed weekends and major holidays. You have access to a nurse at all times for urgent questions. Please call the main number to the clinic Dept: [phone] and follow the prompts.   For any non-urgent questions, you may also contact your provider using MyChart. We now offer e-Visits for anyone 18 and older to request care online for non-urgent symptoms. For details visit [email or web address].   Also download the MyChart app! Go to the app store, search "MyChart", open the app, select , and log in with your MyChart username and password.  Due to Covid, a mask is required upon entering the hospital/clinic. If you do not have a mask, one will be given to you upon arrival. For doctor visits, patients may have 1 support person aged 18 or older with them. For treatment visits, patients cannot have anyone with them due to current Covid guidelines and our immunocompromised population.   

## 2020-10-16 ENCOUNTER — Other Ambulatory Visit (HOSPITAL_COMMUNITY): Payer: Self-pay

## 2020-10-16 MED FILL — Palbociclib Tab 75 MG: ORAL | 28 days supply | Qty: 21 | Fill #1 | Status: CN

## 2020-10-17 ENCOUNTER — Other Ambulatory Visit (HOSPITAL_COMMUNITY): Payer: Self-pay

## 2020-10-17 MED FILL — Palbociclib Tab 75 MG: ORAL | 28 days supply | Qty: 21 | Fill #1 | Status: AC

## 2020-11-05 ENCOUNTER — Ambulatory Visit (HOSPITAL_COMMUNITY)
Admission: RE | Admit: 2020-11-05 | Discharge: 2020-11-05 | Disposition: A | Discharge status: Home / Self Care | Payer: Medicare Other | Source: Ambulatory Visit | Attending: Oncology | Admitting: Oncology

## 2020-11-05 ENCOUNTER — Other Ambulatory Visit: Payer: Self-pay

## 2020-11-05 DIAGNOSIS — C50412 Malignant neoplasm of upper-outer quadrant of left female breast: Secondary | ICD-10-CM | POA: Diagnosis not present

## 2020-11-05 DIAGNOSIS — Z170 Estrogen receptor positive status [ER+]: Secondary | ICD-10-CM | POA: Diagnosis not present

## 2020-11-05 DIAGNOSIS — C7951 Secondary malignant neoplasm of bone: Secondary | ICD-10-CM

## 2020-11-05 DIAGNOSIS — C50919 Malignant neoplasm of unspecified site of unspecified female breast: Secondary | ICD-10-CM | POA: Diagnosis not present

## 2020-11-05 DIAGNOSIS — K8020 Calculus of gallbladder without cholecystitis without obstruction: Secondary | ICD-10-CM | POA: Diagnosis not present

## 2020-11-07 NOTE — Progress Notes (Signed)
[place]  Telephone:([phone]) [phone] Fax:(336) [phone]    ID: April Rosales DOB: 08-Oct-1940  MR#: [id number]  HOZ#:[id number]  Patient Care Team: [name], MD as PCP - General (Internal Medicine) Ondra Deboard, [name], MD as Consulting Physician (Oncology) [name], MD as Consulting Physician (Orthopedic Surgery) [name], MD as Consulting Physician (General Surgery) [name], MD as Consulting Physician (Cardiology) OTHER: [name], DDS   CHIEF COMPLAINT: Stage IV estrogen receptor positive breast cancer  CURRENT TREATMENT: Fulvestrant, palbociclib; denosumab/Prolia every 6 months; trastuzumab subQ every 4 weeks   INTERVAL HISTORY: April Rosales is here today for follow up of her metastatic breast cancer unaccompanied today.  Since her last visit, she underwent follow up abdominal ultrasound on 11/05/2020 showing: no significant change in size of two known liver lesions, measuring 4.9 cm and 2.4 cm; no evidence of new liver lesion.  April Rosales continues on Fulvestrant every 4 weeks, together with palbociclib $RemoveBeforeDE'75mg'[id number]$  daily 3 weeks on and 1 week off.  She is currently completing her third week of the current palbociclib cycle.  She tolerates these treatments well and reports no problems with unusual fatigue nausea or other complications.    We have switched her to denosumab/Prolia every 6 months for osteoporosis.  She started this on 03/28/2020.    She received 1 dose of intravenous trastuzumab which she tolerated poorly.  We have changed that from IV to subQ and she tolerates that much better with no side effects that she is aware of.  Her most recent echocardiogram on 07/30/2020 showed an ejection fraction of 60-65%.   REVIEW OF SYSTEMS:  April Rosales is reluctant to have a booster for COVID but she tells me she mostly stays home.  She is now going to church however.  She says this is "big deal" for her as she has been pretty much locked in for the last  2 years.  Nevertheless she is being very careful.  She is having more back pain which she thinks may be due to another compression fracture.  She is taking tramadol very infrequently, perhaps twice a week.  This is not causing her any constipation.  Aside from these issues a detailed review of systems today was stable   COVID 19 VACCINATION STATUS: [place] x2, most recently 11/2019   BREAST CANCER HISTORY: From the original intake note:   April Rosales underwent right lumpectomy in 1985 at [facility] for what sounds like a stage I breast cancer. She tells me she had more than 30 lymph nodes removed from her right axilla and all of them were clear. She received  adjuvant radiation but no systemic treatment.  The patient had recently refused mammography with the last mammogram I can find dating back to August 2010.  More recently the patient presented with left scapular pain radiating down the left arm.  She was evaluated by Dr. [name], who obtained a chest x-ray showing a possible abnormality at T3. He then set up the patient for an MRI of the thoracic spine performed 08/16/2014.  This showed multiple compression fractures  (a similar picture had been noted on lumbar MRI 10/09/2011 ). However at T3 they noted tumor in the vertebral body extending into the left pedicle and into the lateral epidural space, displacing the cord to the right. There was no evidence of cord compression or cord signal abnormality. There were no other areas of tumor identified in the thoracic spine.         The  patient was then referred to Dr. Hal Neer who on 08/24/2014 set April Rosales up for CT scans of the chest, abdomen and pelvis. There was a dense mass in the upper inner quadrant of the left breast measuring 1.6 cm. There was a 1.2 cm nodule in the minor fissure of the right lung and some evidence of right lung fibrosis at the site of the prior radiation port.  There was also a thyroid mass measuring 2 cm. However there were  no parenchymal lung or liver lesions. Incidental meningoceles were noted as well as sclerosis of the fifth and sixth ribs which were felt to be likely posttraumatic.   On 08/29/2014 the patient underwent bilateral diagnostic mammography with tomosynthesis and left breast ultrasonography.  There were postsurgical changes in the upper right breast. In the left breast there was an irregular mass measuring 2.3 cm in the upper inner quadrant. This was palpable. Ultrasound showed this to be hypoechoic and to measure 2.0 cm. There were adjacent areas of nodularity. There was no definite lymphadenopathy in the left axilla.  Biopsy of this breast mass 08/29/2014 showed an invasive adenocarcinoma with both ductal and lobular features (there was strong diffuse E-cadherin expression as well as areas with total absence of E-cadherin expression), with the preliminary prognostic profile showing strong estrogen positivity, very weak to near absent progesterone positivity, an MIB-1 of approximately 40%, and HER-2 equivocal  The patient's subsequent history is as detailed below.   PAST MEDICAL HISTORY: Past Medical History:  Diagnosis Date   Arthritis    Breast cancer ([place]) 1985/2016   takes [name] daily   Chronic back pain    stenosis/listhesis   Family history of ovarian cancer    Osteoporosis    takes Vit D   Radiation 11/23/14-12/11/14   30 Gy T1-T5     PAST SURGICAL HISTORY: Past Surgical History:  Procedure Laterality Date   ABDOMINAL HYSTERECTOMY     1980   APPENDECTOMY  4035   APPLICATION OF INTRAOPERATIVE CT SCAN N/A 10/20/2014   Procedure: APPLICATION OF INTRAOPERATIVE CAT SCAN;  Surgeon: [name], MD;  Location: [facility];  Service: Neurosurgery;  Laterality: N/A;   BREAST LUMPECTOMY WITH RADIOACTIVE SEED LOCALIZATION Left 05/03/2018   Procedure: LEFT BREAST LUMPECTOMY WITH BRACKETED RADIOACTIVE SEED LOCALIZATION;  Surgeon: [name], MD;  Location: [facility];   Service: General;  Laterality: Left;   BREAST SURGERY Right 1985   THYROID CYST EXCISION  1967    FAMILY HISTORY Family History  Problem Relation Age of Onset   Heart disease Mother    Hypertension Mother    Heart disease Father    Diabetes Father    Breast cancer Paternal Aunt        4 paternal aunts with breast cancer over 72   Prostate cancer Paternal Uncle    Stroke Paternal Grandfather    Ovarian cancer Paternal Aunt    Huntington's disease Other        Nephew, inherited from his father  The patient's father died from a heart attack at the age of 52. He had 9 sisters. 3 of those sisters had breast cancer, all in a menopausal setting. Another sister had ovarian cancer. One of the paternal uncles had cancer of the colon "and back".  The patient's mother died at the age of 67. She was found to have breast cancer shortly before dying , during her final hospitalization.   GYNECOLOGIC HISTORY:  No LMP recorded. Patient has had a hysterectomy.  Menarche age 34, first live birth age 35, the patient is GX P1. She underwent hysterectomy in 1980. She thinks the ovaries were removed, but the CT scan obtained 08/24/2014 showed a definite right ovary. The left ovary may have been removed. She did not take hormone replacement after the hysterectomy.   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 She is divorced, lives alone, with 2 cats. Her son April Rosales lives in [place] where he works in Engineer, technical sales. He has 4 children of his own. The patient is a Psychologist, forensic.   ADVANCED DIRECTIVES:  In place. The patient has named her sister April Rosales [phone]) and her friend April Rosales [phone]) as joint healthcare powers of attorney   HEALTH MAINTENANCE: Social History   Tobacco Use   Smoking status: Former    Packs/day: 0.50    Years: 10.00    Pack years: 5.00    Types: Cigarettes    Quit date: 05/03/1970    Years since quitting: 50.5   Smokeless tobacco: Former    Tobacco comments:    quit smoking 1970's  Substance Use Topics   Alcohol use: Yes    Alcohol/week: 0.0 standard drinks    Comment: Rare   Drug use: No     Colonoscopy: never  PAP: status post hysterectomy  Bone density: 09/12/2016 [facility]/ T score of -4.8 osteoporosis  Lipid panel:  Allergies  Allergen Reactions   Ativan [Lorazepam]     Made her crazy   Dilaudid [Hydromorphone Hcl]     Doesn't want   Haldol [Haloperidol Lactate]     Made her crazy    Current Outpatient Medications  Medication Sig Dispense Refill   diazepam (VALIUM) 5 MG tablet Take one just before MRI; may repeat times one 10 tablet 0   Ascorbic Acid (VITAMIN C PO) Take 1 tablet by mouth daily.     Cholecalciferol (VITAMIN D3 ADULT GUMMIES PO) Take 2,000 Units by mouth daily.      Cyanocobalamin (VITAMIN B-12 PO) Take by mouth daily. Takes every other day     Iron-Vitamins (GERITOL PO) Take by mouth daily.     OVER THE COUNTER MEDICATION OTC Apple Cider Vinegar 1 capsule daily.     palbociclib (IBRANCE) 75 MG tablet TAKE 1 TABLET (75 MG TOTAL) BY MOUTH DAILY. TAKE FOR 21 DAYS ON, 7 DAYS OFF, REPEAT EVERY 28 DAYS. 21 tablet 6   traMADol (ULTRAM) 50 MG tablet Take 1 tablet (50 mg total) by mouth every 12 (twelve) hours as needed. 30 tablet 0   No current facility-administered medications for this visit.    OBJECTIVE: White woman who appears stated age  35:   11/08/20 1138  BP: 139/63  Pulse: 93  Resp: 16  Temp: 97.9 F (36.6 C)  SpO2: 95%    Wt Readings from Last 3 Encounters:  11/08/20 119 lb 6.4 oz (54.2 kg)  10/11/20 120 lb (54.4 kg)  08/22/20 119 lb 3.2 oz (54.1 kg)   Body mass index is 22.56 kg/m.    ECOG FS:1 - Symptomatic but completely ambulatory  Sclerae unicteric, EOMs intact Wearing a mask No cervical or supraclavicular adenopathy Lungs no rales or rhonchi Heart regular rate and rhythm Abd soft, nontender, positive bowel sounds MSK kyphoplasty but no significant spinal  tenderness, no upper extremity lymphedema Neuro: nonfocal, well oriented, appropriate affect Breasts: Deferred   LAB RESULTS:  CMP     Component Value Date/Time   NA 139 11/08/2020 1124   NA 141 05/29/2017 1417  K 4.6 11/08/2020 1124   K 3.6 05/29/2017 1417   CL 106 11/08/2020 1124   CO2 28 11/08/2020 1124   CO2 27 05/29/2017 1417   GLUCOSE 98 11/08/2020 1124   GLUCOSE 111 05/29/2017 1417   BUN 14 11/08/2020 1124   BUN 17.0 05/29/2017 1417   CREATININE 0.70 11/08/2020 1124   CREATININE 0.66 03/05/2020 1218   CREATININE 0.8 05/29/2017 1417   CALCIUM 9.9 11/08/2020 1124   CALCIUM 10.7 (H) 06/26/2017 1216   CALCIUM 10.4 05/29/2017 1417   PROT 6.7 11/08/2020 1124   PROT 7.3 05/29/2017 1417   ALBUMIN 3.4 (L) 11/08/2020 1124   ALBUMIN 3.9 05/29/2017 1417   AST 20 11/08/2020 1124   AST 34 12/02/2019 1147   AST 17 05/29/2017 1417   ALT 12 11/08/2020 1124   ALT 30 12/02/2019 1147   ALT 15 05/29/2017 1417   ALKPHOS 42 11/08/2020 1124   ALKPHOS 42 05/29/2017 1417   BILITOT 0.4 11/08/2020 1124   BILITOT 0.4 12/02/2019 1147   BILITOT 0.30 05/29/2017 1417   GFRNONAA >60 11/08/2020 1124   GFRNONAA 83 03/05/2020 1218   GFRAA 97 03/05/2020 1218    INo results found for: SPEP, UPEP  Lab Results  Component Value Date   WBC 2.6 (L) 11/08/2020   NEUTROABS 1.5 (L) 11/08/2020   HGB 12.8 11/08/2020   HCT 35.9 (L) 11/08/2020   MCV 102.3 (H) 11/08/2020   PLT 135 (L) 11/08/2020      Chemistry      Component Value Date/Time   NA 139 11/08/2020 1124   NA 141 05/29/2017 1417   K 4.6 11/08/2020 1124   K 3.6 05/29/2017 1417   CL 106 11/08/2020 1124   CO2 28 11/08/2020 1124   CO2 27 05/29/2017 1417   BUN 14 11/08/2020 1124   BUN 17.0 05/29/2017 1417   CREATININE 0.70 11/08/2020 1124   CREATININE 0.66 03/05/2020 1218   CREATININE 0.8 05/29/2017 1417      Component Value Date/Time   CALCIUM 9.9 11/08/2020 1124   CALCIUM 10.7 (H) 06/26/2017 1216   CALCIUM 10.4 05/29/2017  1417   ALKPHOS 42 11/08/2020 1124   ALKPHOS 42 05/29/2017 1417   AST 20 11/08/2020 1124   AST 34 12/02/2019 1147   AST 17 05/29/2017 1417   ALT 12 11/08/2020 1124   ALT 30 12/02/2019 1147   ALT 15 05/29/2017 1417   BILITOT 0.4 11/08/2020 1124   BILITOT 0.4 12/02/2019 1147   BILITOT 0.30 05/29/2017 1417       Lab Results  Component Value Date   LABCA2 24 01/01/2016    No components found for: [id number]  No results for input(s): INR in the last 168 hours.  Urinalysis    Component Value Date/Time   COLORURINE YELLOW 03/05/2020 [place] 03/05/2020 1218   LABSPEC 1.006 03/05/2020 [place] 7.0 03/05/2020 [place] 03/05/2020 [place] 03/05/2020 [place] 09/19/2015 [place] 03/05/2020 [place] 03/05/2020 1218   NITRITE NEGATIVE 03/05/2020 1218   LEUKOCYTESUR 2+ (A) 03/05/2020 1218    STUDIES: US Abdomen Limited RUQ (LIVER/GB)  Result Date: 11/05/2020 CLINICAL DATA:  Follow-up liver metastasis. History of breast cancer. EXAM: ULTRASOUND ABDOMEN LIMITED RIGHT UPPER QUADRANT COMPARISON:  Ultrasound 07/27/2020 FINDINGS: Gallbladder: Multiple shadowing gallstones. No gallbladder wall thickening. No pericholecystic fluid. No sonographic Murphy sign noted by sonographer. Common bile duct: Diameter: 3 mm, normal  Liver: Right lobe liver lesion measures 4.9 x 4.0 x 3.6 cm, previously 4.1 x 4.7 x 3.8 cm. The lesion is hypoechoic to adjacent liver parenchyma. Subcapsular lesion measures 2.4 x 2.0 x 2.3 cm and contains centrally increased echogenicity, this previously measured 2.3 x 2.3 x 2.5 cm. Similar sonographic characteristics to prior. No evidence of new liver lesion. Portal vein is patent on color Doppler imaging with normal direction of blood flow towards the liver. Other: No right upper quadrant ascites. IMPRESSION: 1. Two known liver lesions are not significantly changed in size  from prior ultrasound allowing for differences and caliper placement, larger lesion measuring 4.9 x 4.0 x 3.6 cm. No evidence of new liver lesion. 2. Gallstones without sonographic findings of acute cholecystitis. No biliary dilatation. Electronically Signed   By: Keith Rake M.D.   On: 11/05/2020 14:42      ASSESSMENT: 80 y.o. [place] woman with stage IV left breast cancer involving bone  (1) status post right lumpectomy and axillary node dissection in 1985 followed by radiation at [facility] 08/16/2014: measurable disease in spine, lung and left breast   (2) evaluation for left shoulder pain led to thoracic spine MRI 08/16/2014 showing a pathologic fracture at T3 with epidural tumor displacing the cord to the right, but no cord compression. CT scans of the chest, abdomen and pelvis 08/24/2014 showed in addition a mass in the upper outer quadrant left breast measuring 1.6 cm and a nodule in the minor fissure of the right lung measuring 1.2 cm, but no parenchymal lung or liver lesions.   (a) CA 27-29 was noninformative at 38 (09/20/2014)    (3) mammography and ultrasonography 08/29/2014 show a mass in the upper inner left breast which was palpable,  measuring 2.0 cm by ultrasound. Biopsy of this mass 08/29/2014 showed an invasive breast cancer with both lobular and ductal features, estrogen receptor positive, progesterone receptor weakly positive, with an MIB-1 in the 40% range, HER-2 equivocal (6 else ratio 1.5, but average number her nucleus 5.8)   (4) letrozole started 08/31/2014;   (a) palbociclib added Sept 2016 at 75 mg 21/7, with significant neutropenia; not repeated after first cycle  (b) letrozole discontinued 05/29/2017 with evidence of disease progression in the breast   (5)  zolendronate started 09/20/2014, stopped after initial dose due to poor tolerance  (a) denosumab/ Xgeva started 01/31/2015, repeated every 4 weeks  (b) changed to every 8 weeks as of August  2018.   (c) every 12 weeks recommended and ordered to start 01/2020   (6) on 10/20/2014 the patient underwent T2-T3 and T4 decompressive laminectomy with removal of epidural tumor, C7-T4 segmental pedicle screw instrumentation with virage screw system with arrow guidance protocol and C7-T4 posterolateral fusion. The cells were positive for the estrogen receptor. HER-2/neu testing by [facility] showed again equivocal results,  (a) most recent bone scan 10/01/2016 finds no new lesions only postoperative changes  (7) radiation 11/23/2014-12/11/2014.  (a) T1-T5 was treated to 30 Gy in 12 fractions at 2.5 Gy per fraction   (8) initiated close follow-up while considering eventual left lumpectomy or mastectomy depending on  longer-term results of systemic therapy  (a) most recent left breast ultrasonography 10/30/2016 found this mass to measure 0.7 cm  (b) repeat left breast ultrasonography 05/13/2017 showed the upper outer quadrant mass to have grown to 1.4 cm  (c) left breast ultrasound 09/08/2017 shows the mass to now measure 0.9 cm  (d) left breast biopsy x2 on 12/11/2017 shows high-grade  ductal carcinoma in situ, essentially estrogen receptor negative  (9) genetics testing using the Breast/Ovarian Cancer Panel through [facility] [name], MD) found no deleterious mutations in ATM, BARD1, BRCA1, BRCA2, BRIP1, CDH1, CHEK2, EPCAM, FANCC, MLH1, MSH2, MSH6, NBN, PALB2, PMS2, PTEN, RAD51C, RAD51D, STK11, TP53, or XRCC2    (10) right thyroid nodule is a complex cyst as noted on CT scan of the neck 01/29/2016  (11) osteoporosis: [facility] 09/12/2016 showed a T score of -4.8.  (a) switched to Prolia starting 03/29/2020  (12) fulvestrant started 05/29/2017, last dose 07/22/2018 (discontinued due to pandemic).  (a) started palbociclib 06/29/2017 at 75 mg every other day for 21 days on, 7 days off  (b) palbociclib discontinued on 3/29 due to progressive fatigue and patient preference  (13)  was to  start abemaciclib 02/04/2018, but patient opted for going back to palbociclib at a lower dose beginning in November 19, patient subsequently declined s palbociclib, and  proceeded to surgery  (14) status post left lumpectomy 05/03/2018 showing a pT1b pNX invasive ductal carcinoma, grade 2, with equivocal HER-2 results  (a) opted against adjuvant radiation given presence of stage IV disease  (15) tamoxifen supposed to have been started started 09/13/2018 as a "bridge" pending resumption of fulvestrant/denosumab, but never started by the patient  (16) PET scan 11/02/2018 showed no active disease  (a) [place] scan on 10/31/2019 shows activity in 2 liver lesions, no active bone or lung lesions  (b) abdominal ultrasound 11/08/2019 confirms 2 liver lesions measuring 4.4 and 2.1 cm.  (c) biopsy of 1 of the liver lesions 12/12/2019 confirmed metastatic breast cancer, estrogen and progesterone receptor positive, with an MIB-1 of 10%, and HER-2 positive, with a copy number of 2.12 and the number per cell 6.25  (d) abdominal ultrasound 03/12/2020 shows one of the liver lesions to have increased, while the second lesion has decreased  (17) letrozole started 02/15/2019, discontinued 12/02/2019 with evidence of progression  (a) bone density 09/12/2016 showed a T score of -4.8.  (18) fulvestrant resumed 12/08/2019  (a) palbociclib added at 75 mg daily 21/7 starting 12/20/2019   (19) trastuzumab started 04/25/2020, repeat every 28 days  (a) echo 03/12/2020 shows an ejection fraction in the 60-65% range  (b) changed to subcu formulation of trastuzumab 06/20/2020   PLAN: [place] is a little over 6 years out from definitive diagnosis of metastatic breast cancer.  We just restage her with an ultrasound of the abdomen and her disease continues to be well controlled.  She is tolerating her treatment well.  We are continuing the injectable trastuzumab and the Faslodex every 4 weeks and the palbociclib daily 3  weeks on and 1 week off as before.  I am concerned that her total white cell count went down some, but her ANC remains normal.  Today we discussed switching to [employer].  She is really very reluctant to do that so long as we do not have to do that.  At this point we are not obligated to do it but I suspect some time before the end of the year we probably will need to make that change.  At any rate we did discuss that today  She is having a little bit more low back pain.  She thinks she may have had a compression fracture.  I discussed kyphoplasty but she absolutely refuses to undergo that procedure because of her aunts experience with it.  She requested a refill on her Ultram and I was glad to do that  for her.  She takes it very infrequently and it does not constipate her.  Assuming no other problems then she will see me again in 12 weeks.  Before that visit she will have a restaging MRI of the abdomen and I have written for her to receive up to 2 Valiums just to get her through that procedure  Total encounter time 35 minutes.Sarajane Jews C. Maria Gallicchio, MD 11/08/20 12:24 PM Medical Oncology and Hematology [facility] [place], [place] [address] Tel. [phone]    Fax. [phone]   I, Wilburn Mylar, am acting as scribe for Dr. [name]. Trude Cansler.  I, Lurline Del MD, have reviewed the above documentation for accuracy and completeness, and I agree with the above.   *Total Encounter Time as defined by the Centers for Medicare and Medicaid Services includes, in addition to the face-to-face time of a patient visit (documented in the note above) non-face-to-face time: obtaining and reviewing outside history, ordering and reviewing medications, tests or procedures, care coordination (communications with other health care professionals or caregivers) and documentation in the medical record.

## 2020-11-08 ENCOUNTER — Inpatient Hospital Stay: Payer: Medicare Other

## 2020-11-08 ENCOUNTER — Inpatient Hospital Stay: Payer: Medicare Other | Attending: Oncology

## 2020-11-08 ENCOUNTER — Other Ambulatory Visit: Payer: Medicare Other

## 2020-11-08 ENCOUNTER — Other Ambulatory Visit: Payer: Self-pay

## 2020-11-08 ENCOUNTER — Ambulatory Visit: Payer: Medicare Other

## 2020-11-08 ENCOUNTER — Other Ambulatory Visit (HOSPITAL_COMMUNITY): Payer: Self-pay

## 2020-11-08 ENCOUNTER — Inpatient Hospital Stay (HOSPITAL_BASED_OUTPATIENT_CLINIC_OR_DEPARTMENT_OTHER): Payer: Medicare Other | Admitting: Oncology

## 2020-11-08 VITALS — BP 139/63 | HR 93 | Temp 97.9°F | Resp 16 | Ht 61.0 in | Wt 119.4 lb

## 2020-11-08 DIAGNOSIS — C7951 Secondary malignant neoplasm of bone: Secondary | ICD-10-CM

## 2020-11-08 DIAGNOSIS — Z170 Estrogen receptor positive status [ER+]: Secondary | ICD-10-CM | POA: Diagnosis not present

## 2020-11-08 DIAGNOSIS — Z5111 Encounter for antineoplastic chemotherapy: Secondary | ICD-10-CM | POA: Diagnosis not present

## 2020-11-08 DIAGNOSIS — C50919 Malignant neoplasm of unspecified site of unspecified female breast: Secondary | ICD-10-CM | POA: Insufficient documentation

## 2020-11-08 DIAGNOSIS — M810 Age-related osteoporosis without current pathological fracture: Secondary | ICD-10-CM

## 2020-11-08 DIAGNOSIS — R0789 Other chest pain: Secondary | ICD-10-CM | POA: Diagnosis not present

## 2020-11-08 DIAGNOSIS — C50412 Malignant neoplasm of upper-outer quadrant of left female breast: Secondary | ICD-10-CM | POA: Diagnosis not present

## 2020-11-08 LAB — COMPREHENSIVE METABOLIC PANEL
ALT: 12 U/L (ref 0–44)
AST: 20 U/L (ref 15–41)
Albumin: 3.4 g/dL — ABNORMAL LOW (ref 3.5–5.0)
Alkaline Phosphatase: 42 U/L (ref 38–126)
Anion gap: 5 (ref 5–15)
BUN: 14 mg/dL (ref 8–23)
CO2: 28 mmol/L (ref 22–32)
Calcium: 9.9 mg/dL (ref 8.9–10.3)
Chloride: 106 mmol/L (ref 98–111)
Creatinine, Ser: 0.7 mg/dL (ref 0.44–1.00)
GFR, Estimated: >60 mL/min (ref >60)
Glucose, Bld: 98 mg/dL (ref 70–99)
Potassium: 4.6 mmol/L (ref 3.5–5.1)
Sodium: 139 mmol/L (ref 135–145)
Total Bilirubin: 0.4 mg/dL (ref 0.3–1.2)
Total Protein: 6.7 g/dL (ref 6.5–8.1)

## 2020-11-08 LAB — CBC WITH DIFFERENTIAL/PLATELET
Abs Immature Granulocytes: 0.01 10*3/uL (ref 0.00–0.07)
Basophils Absolute: 0.1 10*3/uL (ref 0.0–0.1)
Basophils Relative: 2 %
Eosinophils Absolute: 0 10*3/uL (ref 0.0–0.5)
Eosinophils Relative: 2 %
HCT: 35.9 % — ABNORMAL LOW (ref 36.0–46.0)
Hemoglobin: 12.8 g/dL (ref 12.0–15.0)
Immature Granulocytes: 0 %
Lymphocytes Relative: 31 %
Lymphs Abs: 0.8 10*3/uL (ref 0.7–4.0)
MCH: 36.5 pg — ABNORMAL HIGH (ref 26.0–34.0)
MCHC: 35.7 g/dL (ref 30.0–36.0)
MCV: 102.3 fL — ABNORMAL HIGH (ref 80.0–100.0)
Monocytes Absolute: 0.2 10*3/uL (ref 0.1–1.0)
Monocytes Relative: 8 %
Neutro Abs: 1.5 10*3/uL — ABNORMAL LOW (ref 1.7–7.7)
Neutrophils Relative %: 57 %
Platelets: 135 10*3/uL — ABNORMAL LOW (ref 150–400)
RBC: 3.51 MIL/uL — ABNORMAL LOW (ref 3.87–5.11)
RDW: 14.3 % (ref 11.5–15.5)
WBC: 2.6 10*3/uL — ABNORMAL LOW (ref 4.0–10.5)
nRBC: 0 % (ref 0.0–0.2)

## 2020-11-08 MED ORDER — FULVESTRANT 250 MG/5ML IM SOLN
500.0000 mg | Freq: Once | INTRAMUSCULAR | Status: AC
  Administered 2020-11-08: 500 mg via INTRAMUSCULAR

## 2020-11-08 MED ORDER — ACETAMINOPHEN 325 MG PO TABS
ORAL_TABLET | ORAL | Status: AC
  Filled 2020-11-08: qty 2

## 2020-11-08 MED ORDER — ACETAMINOPHEN 325 MG PO TABS
650.0000 mg | ORAL_TABLET | Freq: Once | ORAL | Status: AC
  Administered 2020-11-08: 650 mg via ORAL

## 2020-11-08 MED ORDER — TRAMADOL HCL 50 MG PO TABS: 50.0000 mg | ORAL_TABLET | Freq: Two times a day (BID) | ORAL | 0 refills | Status: DC | PRN

## 2020-11-08 MED ORDER — DIAZEPAM 5 MG PO TABS
ORAL_TABLET | ORAL | 0 refills | Status: DC
  Filled 2020-11-08: qty 10, 5d supply, fill #0

## 2020-11-08 MED ORDER — TRASTUZUMAB-HYALURONIDASE-OYSK 600-10000 MG-UNT/5ML ~~LOC~~ SOLN
600.0000 mg | Freq: Once | SUBCUTANEOUS | Status: AC
  Administered 2020-11-08: 600 mg via SUBCUTANEOUS
  Filled 2020-11-08: qty 5

## 2020-11-08 MED ORDER — FULVESTRANT 250 MG/5ML IM SOLN
INTRAMUSCULAR | Status: AC
  Filled 2020-11-08: qty 10

## 2020-11-08 NOTE — Patient Instructions (Signed)
[facility]  Discharge Instructions: Thank you for choosing [facility] to provide your oncology and hematology care.   If you have a lab appointment with the Cancer Center, please go directly to the Cancer Center and check in at the registration area.   Wear comfortable clothing and clothing appropriate for easy access to any Portacath or PICC line.   We strive to give you quality time with your provider. You may need to reschedule your appointment if you arrive late (15 or more minutes).  Arriving late affects you and other patients whose appointments are after yours.  Also, if you miss three or more appointments without notifying the office, you may be dismissed from the clinic at the provider's discretion.      For prescription refill requests, have your pharmacy contact our office and allow 72 hours for refills to be completed.    Today you received the following chemotherapy and/or immunotherapy agents: Herceptin Hylecta.     To help prevent nausea and vomiting after your treatment, we encourage you to take your nausea medication as directed.  BELOW ARE SYMPTOMS THAT SHOULD BE REPORTED IMMEDIATELY: . *FEVER GREATER THAN 100.4 F (38 C) OR HIGHER . *CHILLS OR SWEATING . *NAUSEA AND VOMITING THAT IS NOT CONTROLLED WITH YOUR NAUSEA MEDICATION . *UNUSUAL SHORTNESS OF BREATH . *UNUSUAL BRUISING OR BLEEDING . *URINARY PROBLEMS (pain or burning when urinating, or frequent urination) . *BOWEL PROBLEMS (unusual diarrhea, constipation, pain near the anus) . TENDERNESS IN MOUTH AND THROAT WITH OR WITHOUT PRESENCE OF ULCERS (sore throat, sores in mouth, or a toothache) . UNUSUAL RASH, SWELLING OR PAIN  . UNUSUAL VAGINAL DISCHARGE OR ITCHING   Items with * indicate a potential emergency and should be followed up as soon as possible or go to the Emergency Department if any problems should occur.  Please show the CHEMOTHERAPY ALERT CARD or  IMMUNOTHERAPY ALERT CARD at check-in to the Emergency Department and triage nurse.  Should you have questions after your visit or need to cancel or reschedule your appointment, please contact [facility]  Dept: [phone]  and follow the prompts.  Office hours are 8:00 a.m. to 4:30 p.m. Monday - Friday. Please note that voicemails left after 4:00 p.m. may not be returned until the following business day.  We are closed weekends and major holidays. You have access to a nurse at all times for urgent questions. Please call the main number to the clinic Dept: [phone] and follow the prompts.   For any non-urgent questions, you may also contact your provider using MyChart. We now offer e-Visits for anyone 18 and older to request care online for non-urgent symptoms. For details visit [email or web address].   Also download the MyChart app! Go to the app store, search "MyChart", open the app, select , and log in with your MyChart username and password.  Due to Covid, a mask is required upon entering the hospital/clinic. If you do not have a mask, one will be given to you upon arrival. For doctor visits, patients may have 1 support person aged 18 or older with them. For treatment visits, patients cannot have anyone with them due to current Covid guidelines and our immunocompromised population.   

## 2020-11-12 ENCOUNTER — Telehealth: Payer: Self-pay | Admitting: Oncology

## 2020-11-12 ENCOUNTER — Telehealth: Payer: Self-pay

## 2020-11-12 MED ORDER — PALBOCICLIB 75 MG PO TABS
ORAL_TABLET | ORAL | 6 refills | Status: DC
  Filled 2020-11-12: qty 21, 21d supply, fill #0
  Filled 2020-11-13: qty 21, 28d supply, fill #0
  Filled 2020-12-11 (×2): qty 21, 28d supply, fill #1
  Filled 2021-01-07: qty 21, 28d supply, fill #2

## 2020-11-12 NOTE — Telephone Encounter (Signed)
Pt called requesting refill for Ibrance. Medication refilled per request. Pt verbalized thanks and understanding.

## 2020-11-12 NOTE — Telephone Encounter (Signed)
Scheduled per 6/16 los. Called and spoke with pt confirmed 7/14 appt and mailing appt calendar and appr letter

## 2020-11-13 ENCOUNTER — Other Ambulatory Visit (HOSPITAL_COMMUNITY): Payer: Self-pay

## 2020-11-14 ENCOUNTER — Other Ambulatory Visit (HOSPITAL_COMMUNITY): Payer: Self-pay

## 2020-11-21 ENCOUNTER — Ambulatory Visit (HOSPITAL_COMMUNITY)
Admission: RE | Admit: 2020-11-21 | Discharge: 2020-11-21 | Disposition: A | Discharge status: Home / Self Care | Payer: Medicare Other | Source: Ambulatory Visit | Attending: Oncology | Admitting: Oncology

## 2020-11-21 ENCOUNTER — Other Ambulatory Visit: Payer: Self-pay

## 2020-11-21 DIAGNOSIS — C7951 Secondary malignant neoplasm of bone: Secondary | ICD-10-CM | POA: Diagnosis not present

## 2020-11-21 DIAGNOSIS — R0789 Other chest pain: Secondary | ICD-10-CM | POA: Insufficient documentation

## 2020-11-21 DIAGNOSIS — Z0189 Encounter for other specified special examinations: Secondary | ICD-10-CM

## 2020-11-21 DIAGNOSIS — C50919 Malignant neoplasm of unspecified site of unspecified female breast: Secondary | ICD-10-CM

## 2020-11-21 DIAGNOSIS — C50412 Malignant neoplasm of upper-outer quadrant of left female breast: Secondary | ICD-10-CM | POA: Diagnosis not present

## 2020-11-21 DIAGNOSIS — Z170 Estrogen receptor positive status [ER+]: Secondary | ICD-10-CM | POA: Insufficient documentation

## 2020-11-21 LAB — ECHOCARDIOGRAM COMPLETE
Area-P 1/2: 5.54 cm2
Calc EF: 56.3 %
MV VTI: 1.47 cm2
S' Lateral: 1.9 cm
Single Plane A2C EF: 52.4 %
Single Plane A4C EF: 62.6 %

## 2020-11-21 NOTE — Progress Notes (Signed)
  Echocardiogram 2D Echocardiogram has been performed.  April Rosales 11/21/2020, 11:04 AM

## 2020-12-06 ENCOUNTER — Inpatient Hospital Stay: Payer: Medicare Other

## 2020-12-06 ENCOUNTER — Other Ambulatory Visit: Payer: Self-pay

## 2020-12-06 ENCOUNTER — Inpatient Hospital Stay: Payer: Medicare Other | Attending: Oncology

## 2020-12-06 VITALS — BP 143/76 | HR 89 | Temp 97.7°F | Resp 18 | Wt 117.1 lb

## 2020-12-06 DIAGNOSIS — C7951 Secondary malignant neoplasm of bone: Secondary | ICD-10-CM | POA: Diagnosis not present

## 2020-12-06 DIAGNOSIS — C50919 Malignant neoplasm of unspecified site of unspecified female breast: Secondary | ICD-10-CM

## 2020-12-06 DIAGNOSIS — Z5111 Encounter for antineoplastic chemotherapy: Secondary | ICD-10-CM | POA: Diagnosis not present

## 2020-12-06 DIAGNOSIS — M810 Age-related osteoporosis without current pathological fracture: Secondary | ICD-10-CM

## 2020-12-06 DIAGNOSIS — Z170 Estrogen receptor positive status [ER+]: Secondary | ICD-10-CM | POA: Insufficient documentation

## 2020-12-06 DIAGNOSIS — C50412 Malignant neoplasm of upper-outer quadrant of left female breast: Secondary | ICD-10-CM

## 2020-12-06 LAB — CBC WITH DIFFERENTIAL/PLATELET
Abs Immature Granulocytes: 0.01 K/uL (ref 0.00–0.07)
Basophils Absolute: 0.1 K/uL (ref 0.0–0.1)
Basophils Relative: 2 %
Eosinophils Absolute: 0.1 K/uL (ref 0.0–0.5)
Eosinophils Relative: 3 %
HCT: 38.5 % (ref 36.0–46.0)
Hemoglobin: 13.6 g/dL (ref 12.0–15.0)
Immature Granulocytes: 0 %
Lymphocytes Relative: 30 %
Lymphs Abs: 0.8 K/uL (ref 0.7–4.0)
MCH: 36.6 pg — ABNORMAL HIGH (ref 26.0–34.0)
MCHC: 35.3 g/dL (ref 30.0–36.0)
MCV: 103.5 fL — ABNORMAL HIGH (ref 80.0–100.0)
Monocytes Absolute: 0.2 K/uL (ref 0.1–1.0)
Monocytes Relative: 8 %
Neutro Abs: 1.5 K/uL — ABNORMAL LOW (ref 1.7–7.7)
Neutrophils Relative %: 57 %
Platelets: 156 K/uL (ref 150–400)
RBC: 3.72 MIL/uL — ABNORMAL LOW (ref 3.87–5.11)
RDW: 14.4 % (ref 11.5–15.5)
WBC: 2.6 K/uL — ABNORMAL LOW (ref 4.0–10.5)
nRBC: 0 % (ref 0.0–0.2)

## 2020-12-06 LAB — COMPREHENSIVE METABOLIC PANEL WITH GFR
ALT: 13 U/L (ref 0–44)
AST: 22 U/L (ref 15–41)
Albumin: 3.6 g/dL (ref 3.5–5.0)
Alkaline Phosphatase: 40 U/L (ref 38–126)
Anion gap: 6 (ref 5–15)
BUN: 14 mg/dL (ref 8–23)
CO2: 29 mmol/L (ref 22–32)
Calcium: 9.8 mg/dL (ref 8.9–10.3)
Chloride: 106 mmol/L (ref 98–111)
Creatinine, Ser: 0.74 mg/dL (ref 0.44–1.00)
GFR, Estimated: >60 mL/min
Glucose, Bld: 98 mg/dL (ref 70–99)
Potassium: 4.1 mmol/L (ref 3.5–5.1)
Sodium: 141 mmol/L (ref 135–145)
Total Bilirubin: 0.5 mg/dL (ref 0.3–1.2)
Total Protein: 6.8 g/dL (ref 6.5–8.1)

## 2020-12-06 MED ORDER — ACETAMINOPHEN 325 MG PO TABS
650.0000 mg | ORAL_TABLET | Freq: Once | ORAL | Status: AC
  Administered 2020-12-06: 650 mg via ORAL

## 2020-12-06 MED ORDER — FULVESTRANT 250 MG/5ML IM SOLN
INTRAMUSCULAR | Status: AC
  Filled 2020-12-06: qty 5

## 2020-12-06 MED ORDER — FULVESTRANT 250 MG/5ML IM SOLN
500.0000 mg | Freq: Once | INTRAMUSCULAR | Status: AC
  Administered 2020-12-06: 500 mg via INTRAMUSCULAR

## 2020-12-06 MED ORDER — TRASTUZUMAB-HYALURONIDASE-OYSK 600-10000 MG-UNT/5ML ~~LOC~~ SOLN
600.0000 mg | Freq: Once | SUBCUTANEOUS | Status: AC
  Administered 2020-12-06: 600 mg via SUBCUTANEOUS
  Filled 2020-12-06: qty 5

## 2020-12-06 MED ORDER — ACETAMINOPHEN 325 MG PO TABS
ORAL_TABLET | ORAL | Status: AC
  Filled 2020-12-06: qty 2

## 2020-12-06 NOTE — Patient Instructions (Signed)
[facility]  Discharge Instructions: Thank you for choosing [facility] to provide your oncology and hematology care.   If you have a lab appointment with the [facility], please go directly to the [facility] and check in at the registration area.   Wear comfortable clothing and clothing appropriate for easy access to any Portacath or PICC line.   We strive to give you quality time with your provider. You may need to reschedule your appointment if you arrive late (15 or more minutes).  Arriving late affects you and other patients whose appointments are after yours.  Also, if you miss three or more appointments without notifying the office, you may be dismissed from the clinic at the provider's discretion.      For prescription refill requests, have your pharmacy contact our office and allow 72 hours for refills to be completed.    Today you received the following chemotherapy and/or immunotherapy agents Faslodex Injection and Trastuzumab-Hyaluronidase-oysk SubQ Injection.      To help prevent nausea and vomiting after your treatment, we encourage you to take your nausea medication as directed.  BELOW ARE SYMPTOMS THAT SHOULD BE REPORTED IMMEDIATELY: *FEVER GREATER THAN 100.4 F (38 C) OR HIGHER *CHILLS OR SWEATING *NAUSEA AND VOMITING THAT IS NOT CONTROLLED WITH YOUR NAUSEA MEDICATION *UNUSUAL SHORTNESS OF BREATH *UNUSUAL BRUISING OR BLEEDING *URINARY PROBLEMS (pain or burning when urinating, or frequent urination) *BOWEL PROBLEMS (unusual diarrhea, constipation, pain near the anus) TENDERNESS IN MOUTH AND THROAT WITH OR WITHOUT PRESENCE OF ULCERS (sore throat, sores in mouth, or a toothache) UNUSUAL RASH, SWELLING OR PAIN  UNUSUAL VAGINAL DISCHARGE OR ITCHING   Items with * indicate a potential emergency and should be followed up as soon as possible or go to the Emergency Department if any problems should occur.  Please show the  CHEMOTHERAPY ALERT CARD or IMMUNOTHERAPY ALERT CARD at check-in to the Emergency Department and triage nurse.  Should you have questions after your visit or need to cancel or reschedule your appointment, please contact [facility]  Dept: [phone]  and follow the prompts.  Office hours are 8:00 a.m. to 4:30 p.m. Monday - Friday. Please note that voicemails left after 4:00 p.m. may not be returned until the following business day.  We are closed weekends and major holidays. You have access to a nurse at all times for urgent questions. Please call the main number to the clinic Dept: [phone] and follow the prompts.   For any non-urgent questions, you may also contact your provider using MyChart. We now offer e-Visits for anyone 65 and older to request care online for non-urgent symptoms. For details visit [email or web address].   Also download the MyChart app! Go to the app store, search "MyChart", open the app, select [facility], and log in with your MyChart username and password.  Due to Covid, a mask is required upon entering the hospital/clinic. If you do not have a mask, one will be given to you upon arrival. For doctor visits, patients may have 1 support person aged 78 or older with them. For treatment visits, patients cannot have anyone with them due to current Covid guidelines and our immunocompromised population.

## 2020-12-07 ENCOUNTER — Other Ambulatory Visit (HOSPITAL_COMMUNITY): Payer: Self-pay

## 2020-12-11 ENCOUNTER — Telehealth: Payer: Self-pay

## 2020-12-11 ENCOUNTER — Other Ambulatory Visit (HOSPITAL_COMMUNITY): Payer: Self-pay

## 2020-12-11 NOTE — Telephone Encounter (Signed)
Oral Oncology Patient Advocate Encounter   Was successful in securing patient a $6000 grant from [name] (PAF) to provide copayment coverage for Ibrance.  This will keep the out of pocket expense at $0.     I have spoken with the patient.    The billing information is as follows and has been shared with [facility]: [id number] PCN:  [id number] Member ID: [id number] Group ID: [id number] Dates of Eligibility: 12/11/20 through 12/11/21  [facility] Phone [phone] Fax [phone] 12/11/2020 1:57 PM

## 2021-01-03 ENCOUNTER — Inpatient Hospital Stay: Payer: Medicare Other

## 2021-01-03 ENCOUNTER — Other Ambulatory Visit: Payer: Self-pay

## 2021-01-03 ENCOUNTER — Inpatient Hospital Stay: Payer: Medicare Other | Attending: Oncology

## 2021-01-03 VITALS — BP 146/74 | HR 98 | Temp 98.0°F | Resp 16

## 2021-01-03 DIAGNOSIS — Z5112 Encounter for antineoplastic immunotherapy: Secondary | ICD-10-CM | POA: Insufficient documentation

## 2021-01-03 DIAGNOSIS — Z5111 Encounter for antineoplastic chemotherapy: Secondary | ICD-10-CM | POA: Diagnosis not present

## 2021-01-03 DIAGNOSIS — Z170 Estrogen receptor positive status [ER+]: Secondary | ICD-10-CM | POA: Insufficient documentation

## 2021-01-03 DIAGNOSIS — C7951 Secondary malignant neoplasm of bone: Secondary | ICD-10-CM | POA: Insufficient documentation

## 2021-01-03 DIAGNOSIS — C50919 Malignant neoplasm of unspecified site of unspecified female breast: Secondary | ICD-10-CM | POA: Insufficient documentation

## 2021-01-03 DIAGNOSIS — M810 Age-related osteoporosis without current pathological fracture: Secondary | ICD-10-CM

## 2021-01-03 DIAGNOSIS — C50412 Malignant neoplasm of upper-outer quadrant of left female breast: Secondary | ICD-10-CM

## 2021-01-03 LAB — CBC WITH DIFFERENTIAL/PLATELET
Abs Immature Granulocytes: 0 10*3/uL (ref 0.00–0.07)
Basophils Absolute: 0.1 10*3/uL (ref 0.0–0.1)
Basophils Relative: 3 %
Eosinophils Absolute: 0.1 10*3/uL (ref 0.0–0.5)
Eosinophils Relative: 3 %
HCT: 38.8 % (ref 36.0–46.0)
Hemoglobin: 13.7 g/dL (ref 12.0–15.0)
Immature Granulocytes: 0 %
Lymphocytes Relative: 31 %
Lymphs Abs: 0.9 10*3/uL (ref 0.7–4.0)
MCH: 36.7 pg — ABNORMAL HIGH (ref 26.0–34.0)
MCHC: 35.3 g/dL (ref 30.0–36.0)
MCV: 104 fL — ABNORMAL HIGH (ref 80.0–100.0)
Monocytes Absolute: 0.2 10*3/uL (ref 0.1–1.0)
Monocytes Relative: 9 %
Neutro Abs: 1.5 10*3/uL — ABNORMAL LOW (ref 1.7–7.7)
Neutrophils Relative %: 54 %
Platelets: 145 10*3/uL — ABNORMAL LOW (ref 150–400)
RBC: 3.73 MIL/uL — ABNORMAL LOW (ref 3.87–5.11)
RDW: 14 % (ref 11.5–15.5)
WBC: 2.8 10*3/uL — ABNORMAL LOW (ref 4.0–10.5)
nRBC: 0 % (ref 0.0–0.2)

## 2021-01-03 LAB — COMPREHENSIVE METABOLIC PANEL
ALT: 16 U/L (ref 0–44)
AST: 20 U/L (ref 15–41)
Albumin: 3.6 g/dL (ref 3.5–5.0)
Alkaline Phosphatase: 39 U/L (ref 38–126)
Anion gap: 6 (ref 5–15)
BUN: 15 mg/dL (ref 8–23)
CO2: 28 mmol/L (ref 22–32)
Calcium: 9.9 mg/dL (ref 8.9–10.3)
Chloride: 106 mmol/L (ref 98–111)
Creatinine, Ser: 0.8 mg/dL (ref 0.44–1.00)
GFR, Estimated: >60 mL/min (ref >60)
Glucose, Bld: 102 mg/dL — ABNORMAL HIGH (ref 70–99)
Potassium: 4.7 mmol/L (ref 3.5–5.1)
Sodium: 140 mmol/L (ref 135–145)
Total Bilirubin: 0.5 mg/dL (ref 0.3–1.2)
Total Protein: 6.8 g/dL (ref 6.5–8.1)

## 2021-01-03 MED ORDER — FULVESTRANT 250 MG/5ML IM SOLN
500.0000 mg | Freq: Once | INTRAMUSCULAR | Status: AC
Start: 2021-01-03 — End: 2021-01-03
  Administered 2021-01-03: 500 mg via INTRAMUSCULAR
  Filled 2021-01-03: qty 10

## 2021-01-03 MED ORDER — ACETAMINOPHEN 325 MG PO TABS
650.0000 mg | ORAL_TABLET | Freq: Once | ORAL | Status: AC
  Administered 2021-01-03: 650 mg via ORAL
  Filled 2021-01-03: qty 2

## 2021-01-03 MED ORDER — TRASTUZUMAB-HYALURONIDASE-OYSK 600-10000 MG-UNT/5ML ~~LOC~~ SOLN
600.0000 mg | Freq: Once | SUBCUTANEOUS | Status: AC
Start: 2021-01-03 — End: 2021-01-03
  Administered 2021-01-03: 600 mg via SUBCUTANEOUS
  Filled 2021-01-03: qty 5

## 2021-01-03 NOTE — Patient Instructions (Signed)
[facility]   Discharge Instructions: Thank you for choosing [facility] to provide your oncology and hematology care.   If you have a lab appointment with the [facility], please go directly to the [facility] and check in at the registration area.   Wear comfortable clothing and clothing appropriate for easy access to any Portacath or PICC line.   We strive to give you quality time with your provider. You may need to reschedule your appointment if you arrive late (15 or more minutes).  Arriving late affects you and other patients whose appointments are after yours.  Also, if you miss three or more appointments without notifying the office, you may be dismissed from the clinic at the provider's discretion.      For prescription refill requests, have your pharmacy contact our office and allow 72 hours for refills to be completed.    Today you received the following chemotherapy and/or immunotherapy agents: trastuzumab hyaluronidase.      To help prevent nausea and vomiting after your treatment, we encourage you to take your nausea medication as directed.  BELOW ARE SYMPTOMS THAT SHOULD BE REPORTED IMMEDIATELY: *FEVER GREATER THAN 100.4 F (38 C) OR HIGHER *CHILLS OR SWEATING *NAUSEA AND VOMITING THAT IS NOT CONTROLLED WITH YOUR NAUSEA MEDICATION *UNUSUAL SHORTNESS OF BREATH *UNUSUAL BRUISING OR BLEEDING *URINARY PROBLEMS (pain or burning when urinating, or frequent urination) *BOWEL PROBLEMS (unusual diarrhea, constipation, pain near the anus) TENDERNESS IN MOUTH AND THROAT WITH OR WITHOUT PRESENCE OF ULCERS (sore throat, sores in mouth, or a toothache) UNUSUAL RASH, SWELLING OR PAIN  UNUSUAL VAGINAL DISCHARGE OR ITCHING   Items with * indicate a potential emergency and should be followed up as soon as possible or go to the Emergency Department if any problems should occur.  Please show the CHEMOTHERAPY ALERT CARD or IMMUNOTHERAPY ALERT  CARD at check-in to the Emergency Department and triage nurse.  Should you have questions after your visit or need to cancel or reschedule your appointment, please contact [facility]  Dept: [phone]  and follow the prompts.  Office hours are 8:00 a.m. to 4:30 p.m. Monday - Friday. Please note that voicemails left after 4:00 p.m. may not be returned until the following business day.  We are closed weekends and major holidays. You have access to a nurse at all times for urgent questions. Please call the main number to the clinic Dept: [phone] and follow the prompts.   For any non-urgent questions, you may also contact your provider using MyChart. We now offer e-Visits for anyone 35 and older to request care online for non-urgent symptoms. For details visit [email or web address].   Also download the MyChart app! Go to the app store, search "MyChart", open the app, select [facility], and log in with your MyChart username and password.  Due to Covid, a mask is required upon entering the hospital/clinic. If you do not have a mask, one will be given to you upon arrival. For doctor visits, patients may have 1 support person aged 26 or older with them. For treatment visits, patients cannot have anyone with them due to current Covid guidelines and our immunocompromised population.

## 2021-01-07 ENCOUNTER — Other Ambulatory Visit (HOSPITAL_COMMUNITY): Payer: Self-pay

## 2021-01-09 ENCOUNTER — Other Ambulatory Visit (HOSPITAL_COMMUNITY): Payer: Self-pay

## 2021-01-17 ENCOUNTER — Telehealth: Payer: Self-pay | Admitting: *Deleted

## 2021-01-17 NOTE — Telephone Encounter (Signed)
Pt left message stating " I need someone to call me about the scan that is scheduled " return call number given as per demographic page.  Noted pt is scheduled for MRI of her abd 9/1 per MD visit in June for restaging of known mets. MD sent in valium as premed.  This RN attempted to call pt x 2 with no answer x 15+ rings and no transfer to VM.

## 2021-01-18 ENCOUNTER — Telehealth: Payer: Self-pay

## 2021-01-18 NOTE — Telephone Encounter (Signed)
This nurse spoke with pt per phone note 8/25. Pt asks about MRI and if she will be enclosed. Explained MRI to pt and advised pt to take Diazepam as directed. Pt states she will take it 1 hour before leaving home and 1/2 hour before scan. Pt verbalized thanks for call back.

## 2021-01-24 ENCOUNTER — Ambulatory Visit (HOSPITAL_COMMUNITY)
Admission: RE | Admit: 2021-01-24 | Discharge: 2021-01-24 | Disposition: A | Discharge status: Home / Self Care | Payer: Medicare Other | Source: Ambulatory Visit | Attending: Oncology | Admitting: Oncology

## 2021-01-24 ENCOUNTER — Other Ambulatory Visit: Payer: Self-pay

## 2021-01-24 DIAGNOSIS — K8020 Calculus of gallbladder without cholecystitis without obstruction: Secondary | ICD-10-CM | POA: Diagnosis not present

## 2021-01-24 DIAGNOSIS — C50919 Malignant neoplasm of unspecified site of unspecified female breast: Secondary | ICD-10-CM | POA: Insufficient documentation

## 2021-01-24 DIAGNOSIS — R0789 Other chest pain: Secondary | ICD-10-CM | POA: Insufficient documentation

## 2021-01-24 DIAGNOSIS — Z170 Estrogen receptor positive status [ER+]: Secondary | ICD-10-CM | POA: Diagnosis not present

## 2021-01-24 DIAGNOSIS — C50412 Malignant neoplasm of upper-outer quadrant of left female breast: Secondary | ICD-10-CM | POA: Insufficient documentation

## 2021-01-24 DIAGNOSIS — C7951 Secondary malignant neoplasm of bone: Secondary | ICD-10-CM | POA: Diagnosis not present

## 2021-01-24 DIAGNOSIS — C787 Secondary malignant neoplasm of liver and intrahepatic bile duct: Secondary | ICD-10-CM | POA: Diagnosis not present

## 2021-01-24 DIAGNOSIS — N281 Cyst of kidney, acquired: Secondary | ICD-10-CM | POA: Diagnosis not present

## 2021-01-24 MED ORDER — GADOBUTROL 1 MMOL/ML IV SOLN
5.0000 mL | Freq: Once | INTRAVENOUS | Status: AC | PRN
  Administered 2021-01-24: 5 mL via INTRAVENOUS

## 2021-01-29 ENCOUNTER — Other Ambulatory Visit: Payer: Self-pay | Admitting: Oncology

## 2021-01-29 NOTE — Progress Notes (Signed)
I called April Rosales and let her know the results of her liver MRI.  She is going to go off the Herceptin and we have multiple possibilities to consider including Herceptin and Perjeta, TDM 1, and now T-DXd, which is what I would like to try to obtain for her.  I am going to ask our pharmacy chemotherapy specialist to help [place] sort all the options out for the patient.

## 2021-01-30 ENCOUNTER — Other Ambulatory Visit (HOSPITAL_COMMUNITY): Payer: Self-pay

## 2021-01-31 ENCOUNTER — Other Ambulatory Visit (HOSPITAL_COMMUNITY): Payer: Self-pay

## 2021-01-31 ENCOUNTER — Inpatient Hospital Stay: Payer: Medicare Other | Attending: Oncology | Admitting: Oncology

## 2021-01-31 ENCOUNTER — Ambulatory Visit: Payer: Medicare Other

## 2021-01-31 ENCOUNTER — Other Ambulatory Visit: Payer: Self-pay

## 2021-01-31 ENCOUNTER — Inpatient Hospital Stay: Payer: Medicare Other | Admitting: Pharmacist

## 2021-01-31 ENCOUNTER — Ambulatory Visit: Payer: Medicare Other | Admitting: Pharmacist

## 2021-01-31 ENCOUNTER — Inpatient Hospital Stay: Payer: Medicare Other

## 2021-01-31 VITALS — BP 143/69 | HR 100 | Temp 99.0°F | Resp 16 | Ht 61.0 in | Wt 115.7 lb

## 2021-01-31 DIAGNOSIS — Z5111 Encounter for antineoplastic chemotherapy: Secondary | ICD-10-CM | POA: Diagnosis present

## 2021-01-31 DIAGNOSIS — C7951 Secondary malignant neoplasm of bone: Secondary | ICD-10-CM | POA: Diagnosis not present

## 2021-01-31 DIAGNOSIS — C50919 Malignant neoplasm of unspecified site of unspecified female breast: Secondary | ICD-10-CM | POA: Diagnosis not present

## 2021-01-31 DIAGNOSIS — Z5112 Encounter for antineoplastic immunotherapy: Secondary | ICD-10-CM | POA: Diagnosis not present

## 2021-01-31 DIAGNOSIS — Z170 Estrogen receptor positive status [ER+]: Secondary | ICD-10-CM | POA: Diagnosis not present

## 2021-01-31 DIAGNOSIS — C50412 Malignant neoplasm of upper-outer quadrant of left female breast: Secondary | ICD-10-CM

## 2021-01-31 DIAGNOSIS — C50912 Malignant neoplasm of unspecified site of left female breast: Secondary | ICD-10-CM | POA: Diagnosis not present

## 2021-01-31 DIAGNOSIS — C50911 Malignant neoplasm of unspecified site of right female breast: Secondary | ICD-10-CM

## 2021-01-31 LAB — CBC WITH DIFFERENTIAL/PLATELET
Abs Immature Granulocytes: 0.01 10*3/uL (ref 0.00–0.07)
Basophils Absolute: 0.1 10*3/uL (ref 0.0–0.1)
Basophils Relative: 3 %
Eosinophils Absolute: 0.1 10*3/uL (ref 0.0–0.5)
Eosinophils Relative: 3 %
HCT: 39.7 % (ref 36.0–46.0)
Hemoglobin: 13.8 g/dL (ref 12.0–15.0)
Immature Granulocytes: 0 %
Lymphocytes Relative: 33 %
Lymphs Abs: 0.9 10*3/uL (ref 0.7–4.0)
MCH: 36.4 pg — ABNORMAL HIGH (ref 26.0–34.0)
MCHC: 34.8 g/dL (ref 30.0–36.0)
MCV: 104.7 fL — ABNORMAL HIGH (ref 80.0–100.0)
Monocytes Absolute: 0.3 10*3/uL (ref 0.1–1.0)
Monocytes Relative: 9 %
Neutro Abs: 1.5 10*3/uL — ABNORMAL LOW (ref 1.7–7.7)
Neutrophils Relative %: 52 %
Platelets: 149 10*3/uL — ABNORMAL LOW (ref 150–400)
RBC: 3.79 MIL/uL — ABNORMAL LOW (ref 3.87–5.11)
RDW: 14 % (ref 11.5–15.5)
WBC: 2.8 10*3/uL — ABNORMAL LOW (ref 4.0–10.5)
nRBC: 0 % (ref 0.0–0.2)

## 2021-01-31 LAB — COMPREHENSIVE METABOLIC PANEL
ALT: 13 U/L (ref 0–44)
AST: 21 U/L (ref 15–41)
Albumin: 3.6 g/dL (ref 3.5–5.0)
Alkaline Phosphatase: 42 U/L (ref 38–126)
Anion gap: 9 (ref 5–15)
BUN: 15 mg/dL (ref 8–23)
CO2: 26 mmol/L (ref 22–32)
Calcium: 10.4 mg/dL — ABNORMAL HIGH (ref 8.9–10.3)
Chloride: 106 mmol/L (ref 98–111)
Creatinine, Ser: 0.8 mg/dL (ref 0.44–1.00)
GFR, Estimated: >60 mL/min (ref >60)
Glucose, Bld: 98 mg/dL (ref 70–99)
Potassium: 4.3 mmol/L (ref 3.5–5.1)
Sodium: 141 mmol/L (ref 135–145)
Total Bilirubin: 0.4 mg/dL (ref 0.3–1.2)
Total Protein: 6.9 g/dL (ref 6.5–8.1)

## 2021-01-31 MED ORDER — DEXAMETHASONE 4 MG PO TABS: 8.0000 mg | ORAL_TABLET | Freq: Every day | ORAL | 1 refills | Status: DC

## 2021-01-31 MED ORDER — PROCHLORPERAZINE MALEATE 10 MG PO TABS: 10.0000 mg | ORAL_TABLET | Freq: Four times a day (QID) | ORAL | 1 refills | Status: DC | PRN

## 2021-01-31 MED ORDER — TRAMADOL HCL 50 MG PO TABS: 50.0000 mg | ORAL_TABLET | Freq: Two times a day (BID) | ORAL | 0 refills | Status: DC | PRN

## 2021-01-31 NOTE — Progress Notes (Signed)
[place]  [phone] [phone]    ID: April Rosales DOB: 11-21-1940  MR#: [id number]  EVO#:[id number]  Patient Care Team: [name], MD as PCP - General (Internal Medicine) Sagar Tengan, [name], MD as Consulting Physician (Oncology) [name], MD as Consulting Physician (Orthopedic Surgery) [name], MD as Consulting Physician (General Surgery) [name], MD as Consulting Physician (Cardiology) [name], RPH-CPP (Pharmacist) OTHER: [name], DDS   CHIEF COMPLAINT: Stage IV estrogen receptor positive breast cancer  CURRENT TREATMENT: Fulvestrant, palbociclib; denosumab/Prolia every 6 months; trastuzumab subQ every 4 weeks   INTERVAL HISTORY: April Rosales is here today for follow up of her metastatic breast cancer.  She tells me her sister who frequently comes with her just returned from a trip to [place] and is quarantined at home with [place].    Since her last visit, April Rosales underwent an abdomen MRI 01/24/2021 showing: diffuse liver metastases, increased in size and number compared to previous exams; no other sites of metastatic disease.  She also underwent repeat echocardiogram on 11/21/2020 showing stability with an ejection fraction of 60-65%.  We have switched her to denosumab/Prolia every 6 months for osteoporosis.  She started this on 03/28/2020.   REVIEW OF SYSTEMS:  April Rosales is clinically very stable.  She tells me her appetite is a little down but her taste is okay and she has no nausea.  She has no pain no fever no cough no rash.  She mostly stays in walks around the house and does some reading and watching TV.  A detailed review of systems was otherwise noncontributory   COVID 19 VACCINATION STATUS: April Rosales x2, most recently 11/2019, no boosters as of September 2022   BREAST CANCER HISTORY: From the original intake note:   Adhya underwent right lumpectomy in [facility] at [facility] for what  sounds like a stage I breast cancer. She tells me she had more than 30 lymph nodes removed from her right axilla and all of them were clear. She received  adjuvant radiation but no systemic treatment.  The patient had recently refused mammography with the last mammogram I can find dating back to August 2010.  More recently the patient presented with left scapular pain radiating down the left arm.  She was evaluated by Dr. [name], who obtained a chest x-ray showing a possible abnormality at T3. He then set up the patient for an MRI of the thoracic spine performed 08/16/2014.  This showed multiple compression fractures  (a similar picture had been noted on lumbar MRI 10/09/2011 ). However at T3 they noted tumor in the vertebral body extending into the left pedicle and into the lateral epidural space, displacing the cord to the right. There was no evidence of cord compression or cord signal abnormality. There were no other areas of tumor identified in the thoracic spine.         The patient was then referred to Dr. Hal Neer who on 08/24/2014 set April Rosales up for CT scans of the chest, abdomen and pelvis. There was a dense mass in the upper inner quadrant of the left breast measuring 1.6 cm. There was a 1.2 cm nodule in the minor fissure of the right lung and some evidence of right lung fibrosis at the site of the prior radiation port.  There was also a thyroid mass measuring 2 cm. However there were no parenchymal lung or liver lesions. Incidental meningoceles were noted as well as sclerosis of the fifth and  sixth ribs which were felt to be likely posttraumatic.   On 08/29/2014 the patient underwent bilateral diagnostic mammography with tomosynthesis and left breast ultrasonography.  There were postsurgical changes in the upper right breast. In the left breast there was an irregular mass measuring 2.3 cm in the upper inner quadrant. This was palpable. Ultrasound showed this to be hypoechoic and to measure 2.0 cm.  There were adjacent areas of nodularity. There was no definite lymphadenopathy in the left axilla.  Biopsy of this breast mass 08/29/2014 showed an invasive adenocarcinoma with both ductal and lobular features (there was strong diffuse E-cadherin expression as well as areas with total absence of E-cadherin expression), with the preliminary prognostic profile showing strong estrogen positivity, very weak to near absent progesterone positivity, an MIB-1 of approximately 40%, and HER-2 equivocal  The patient's subsequent history is as detailed below.   PAST MEDICAL HISTORY: Past Medical History:  Diagnosis Date   Arthritis    Breast cancer ([place]) 1985/2016   takes [name] daily   Chronic back pain    stenosis/listhesis   Family history of ovarian cancer    Osteoporosis    takes Vit D   Radiation 11/23/14-12/11/14   30 Gy T1-T5     PAST SURGICAL HISTORY: Past Surgical History:  Procedure Laterality Date   ABDOMINAL HYSTERECTOMY     1980   APPENDECTOMY  7616   APPLICATION OF INTRAOPERATIVE CT SCAN N/A 10/20/2014   Procedure: APPLICATION OF INTRAOPERATIVE CAT SCAN;  Surgeon: [name], MD;  Location: [facility];  Service: Neurosurgery;  Laterality: N/A;   BREAST LUMPECTOMY WITH RADIOACTIVE SEED LOCALIZATION Left 05/03/2018   Procedure: LEFT BREAST LUMPECTOMY WITH BRACKETED RADIOACTIVE SEED LOCALIZATION;  Surgeon: [name], MD;  Location: [facility];  Service: General;  Laterality: Left;   BREAST SURGERY Right 1985   THYROID CYST EXCISION  1967    FAMILY HISTORY Family History  Problem Relation Age of Onset   Heart disease Mother    Hypertension Mother    Heart disease Father    Diabetes Father    Breast cancer Paternal Aunt        4 paternal aunts with breast cancer over 71   Prostate cancer Paternal Uncle    Stroke Paternal Grandfather    Ovarian cancer Paternal Aunt    Huntington's disease Other        Nephew, inherited from his father  The  patient's father died from a heart attack at the age of 46. He had 9 sisters. 3 of those sisters had breast cancer, all in a menopausal setting. Another sister had ovarian cancer. One of the paternal uncles had cancer of the colon "and back".  The patient's mother died at the age of 41. She was found to have breast cancer shortly before dying , during her final hospitalization.   GYNECOLOGIC HISTORY:  No LMP recorded. Patient has had a hysterectomy.  Menarche age 34, first live birth age 73, the patient is GX P1. She underwent hysterectomy in 1980. She thinks the ovaries were removed, but the CT scan obtained 08/24/2014 showed a definite right ovary. The left ovary may have been removed. She did not take hormone replacement after the hysterectomy.   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 She is divorced, lives alone, with 2 cats. Her son April Rosales lives in [place] where he works in Engineer, technical sales. He has 4 children of his own. The patient is a Psychologist, forensic.   ADVANCED DIRECTIVES:  In place. The patient has named her sister April Rosales [phone]) and her friend April Rosales [phone]) as joint healthcare powers of attorney   HEALTH MAINTENANCE: Social History   Tobacco Use   Smoking status: Former    Packs/day: 0.50    Years: 10.00    Pack years: 5.00    Types: Cigarettes    Quit date: 05/03/1970    Years since quitting: 50.7   Smokeless tobacco: Former   Tobacco comments:    quit smoking 1970's  Substance Use Topics   Alcohol use: Yes    Alcohol/week: 0.0 standard drinks    Comment: Rare   Drug use: No     Colonoscopy: never  PAP: status post hysterectomy  Bone density: 09/12/2016 [facility]/ T score of -4.8 osteoporosis  Lipid panel:  Allergies  Allergen Reactions   Ativan [Lorazepam]     Made her crazy   Dilaudid [Hydromorphone Hcl]     Doesn't want   Haldol [Haloperidol Lactate]     Made her crazy    Current Outpatient Medications  Medication  Sig Dispense Refill   Cholecalciferol (VITAMIN D3 ADULT GUMMIES PO) Take 2,000 Units by mouth daily.      Cyanocobalamin (VITAMIN B-12 PO) Take by mouth daily. Takes every other day     dexamethasone (DECADRON) 4 MG tablet Take 2 tablets (8 mg total) by mouth daily. Start the day after chemotherapy for 2 days. (Patient not taking: Reported on 01/31/2021) 30 tablet 1   diazepam (VALIUM) 5 MG tablet Take one tablet by mouth just before MRI; may repeat once 10 tablet 0   Iron-Vitamins (GERITOL PO) Take by mouth daily.     OVER THE COUNTER MEDICATION OTC Apple Cider Vinegar 1 capsule daily.     prochlorperazine (COMPAZINE) 10 MG tablet Take 1 tablet (10 mg total) by mouth every 6 (six) hours as needed (Nausea or vomiting). (Patient not taking: Reported on 01/31/2021) 30 tablet 1   traMADol (ULTRAM) 50 MG tablet Take 1 tablet (50 mg total) by mouth every 12 (twelve) hours as needed. 30 tablet 0   No current facility-administered medications for this visit.    OBJECTIVE: White woman who appears stated age  80:   01/31/21 1209  BP: (!) 143/69  Pulse: 100  Resp: 16  Temp: 99 F (37.2 C)  SpO2: 97%     Wt Readings from Last 3 Encounters:  01/31/21 115 lb 11.2 oz (52.5 kg)  12/06/20 117 lb 1.9 oz (53.1 kg)  11/08/20 119 lb 6.4 oz (54.2 kg)   Body mass index is 21.86 kg/m.    ECOG FS:1 - Symptomatic but completely ambulatory  Sclerae unicteric, EOMs intact Wearing a mask No cervical or supraclavicular adenopathy Lungs no rales or rhonchi Heart regular rate and rhythm Abd soft, nontender, positive bowel sounds MSK kyphosis but no focal spinal tenderness, no upper extremity lymphedema Neuro: nonfocal, well oriented, appropriate affect Breasts: Deferred   LAB RESULTS:  CMP     Component Value Date/Time   NA 141 01/31/2021 1157   NA 141 05/29/2017 1417   K 4.3 01/31/2021 1157   K 3.6 05/29/2017 1417   CL 106 01/31/2021 1157   CO2 26 01/31/2021 1157   CO2 27 05/29/2017 1417    GLUCOSE 98 01/31/2021 1157   GLUCOSE 111 05/29/2017 1417   BUN 15 01/31/2021 1157   BUN 17.0 05/29/2017 1417   CREATININE 0.80 01/31/2021 1157   CREATININE 0.66 03/05/2020 1218   CREATININE 0.8 05/29/2017 1417  CALCIUM 10.4 (H) 01/31/2021 1157   CALCIUM 10.7 (H) 06/26/2017 1216   CALCIUM 10.4 05/29/2017 1417   PROT 6.9 01/31/2021 1157   PROT 7.3 05/29/2017 1417   ALBUMIN 3.6 01/31/2021 1157   ALBUMIN 3.9 05/29/2017 1417   AST 21 01/31/2021 1157   AST 34 12/02/2019 1147   AST 17 05/29/2017 1417   ALT 13 01/31/2021 1157   ALT 30 12/02/2019 1147   ALT 15 05/29/2017 1417   ALKPHOS 42 01/31/2021 1157   ALKPHOS 42 05/29/2017 1417   BILITOT 0.4 01/31/2021 1157   BILITOT 0.4 12/02/2019 1147   BILITOT 0.30 05/29/2017 1417   GFRNONAA >60 01/31/2021 1157   GFRNONAA 83 03/05/2020 1218   GFRAA 97 03/05/2020 1218    INo results found for: SPEP, UPEP  Lab Results  Component Value Date   WBC 2.8 (L) 01/31/2021   NEUTROABS 1.5 (L) 01/31/2021   HGB 13.8 01/31/2021   HCT 39.7 01/31/2021   MCV 104.7 (H) 01/31/2021   PLT 149 (L) 01/31/2021      Chemistry      Component Value Date/Time   NA 141 01/31/2021 1157   NA 141 05/29/2017 1417   K 4.3 01/31/2021 1157   K 3.6 05/29/2017 1417   CL 106 01/31/2021 1157   CO2 26 01/31/2021 1157   CO2 27 05/29/2017 1417   BUN 15 01/31/2021 1157   BUN 17.0 05/29/2017 1417   CREATININE 0.80 01/31/2021 1157   CREATININE 0.66 03/05/2020 1218   CREATININE 0.8 05/29/2017 1417      Component Value Date/Time   CALCIUM 10.4 (H) 01/31/2021 1157   CALCIUM 10.7 (H) 06/26/2017 1216   CALCIUM 10.4 05/29/2017 1417   ALKPHOS 42 01/31/2021 1157   ALKPHOS 42 05/29/2017 1417   AST 21 01/31/2021 1157   AST 34 12/02/2019 1147   AST 17 05/29/2017 1417   ALT 13 01/31/2021 1157   ALT 30 12/02/2019 1147   ALT 15 05/29/2017 1417   BILITOT 0.4 01/31/2021 1157   BILITOT 0.4 12/02/2019 1147   BILITOT 0.30 05/29/2017 1417       Lab Results  Component  Value Date   LABCA2 24 01/01/2016    No components found for: [id number]  No results for input(s): INR in the last 168 hours.  Urinalysis    Component Value Date/Time   COLORURINE YELLOW 03/05/2020 [place] 03/05/2020 1218   LABSPEC 1.006 03/05/2020 [place] 7.0 03/05/2020 [place] 03/05/2020 [place] 03/05/2020 [place] 09/19/2015 [place] 03/05/2020 [place] 03/05/2020 1218   NITRITE NEGATIVE 03/05/2020 1218   LEUKOCYTESUR 2+ (A) 03/05/2020 1218    STUDIES: MR Abdomen W Wo Contrast  Result Date: 01/25/2021 CLINICAL DATA:  Metastatic breast carcinoma. EXAM: MRI ABDOMEN WITHOUT AND WITH CONTRAST TECHNIQUE: Multiplanar multisequence MR imaging of the abdomen was performed both before and after the administration of intravenous contrast. CONTRAST:  38m GADAVIST GADOBUTROL 1 MMOL/ML IV SOLN COMPARISON:  PET-CT on 10/28/2019, and ultrasound on 11/05/2020 FINDINGS: Image degradation by motion artifact noted. Lower chest: No acute findings. Hepatobiliary: Numerous hypovascular masses are seen throughout the right and left lobes, consistent with diffuse liver metastases. Largest mass in the liver dome measures 5.7 x 4.3 cm. These show increase in size and number compared to previous ultrasound and PET-CT exams. Multiple gallstones are seen, however there is no evidence of cholecystitis or biliary ductal dilatation.  Pancreas:  No mass or inflammatory changes. Spleen:  Within normal limits in size and appearance. Adrenals/Urinary Tract: No adrenal or renal masses identified. Tiny left upper pole renal cyst noted. No evidence of hydronephrosis. Stomach/Bowel: Visualized portion unremarkable. Vascular/Lymphatic: No pathologically enlarged lymph nodes identified. No acute vascular findings. Other:  None. Musculoskeletal:  No suspicious bone lesions identified. IMPRESSION: Diffuse liver metastases,  increased in size and number compared to previous ultrasound and PET-CT exams. No other sites of metastatic disease within the abdomen. Cholelithiasis. No radiographic evidence of cholecystitis or biliary ductal dilatation. Electronically Signed   By: Marlaine Hind M.D.   On: 01/25/2021 16:38      ASSESSMENT: 80 y.o. [place] woman with stage IV left breast cancer involving bone  (1) status post right lumpectomy and axillary node dissection in 1985 followed by radiation at [facility] 08/16/2014: measurable disease in spine, lung and left breast   (2) evaluation for left shoulder pain led to thoracic spine MRI 08/16/2014 showing a pathologic fracture at T3 with epidural tumor displacing the cord to the right, but no cord compression. CT scans of the chest, abdomen and pelvis 08/24/2014 showed in addition a mass in the upper outer quadrant left breast measuring 1.6 cm and a nodule in the minor fissure of the right lung measuring 1.2 cm, but no parenchymal lung or liver lesions.   (a) CA 27-29 was noninformative at 38 (09/20/2014)    (3) mammography and ultrasonography 08/29/2014 show a mass in the upper inner left breast which was palpable,  measuring 2.0 cm by ultrasound. Biopsy of this mass 08/29/2014 showed an invasive breast cancer with both lobular and ductal features, estrogen receptor positive, progesterone receptor weakly positive, with an MIB-1 in the 40% range, HER-2 equivocal (6 else ratio 1.5, but average number her nucleus 5.8)   (4) letrozole started 08/31/2014;   (a) palbociclib added Sept 2016 at 75 mg 21/7, with significant neutropenia; not repeated after first cycle  (b) letrozole discontinued 05/29/2017 with evidence of disease progression in the breast   (5)  zolendronate started 09/20/2014, stopped after initial dose due to poor tolerance  (a) denosumab/ Xgeva started 01/31/2015, repeated every 4 weeks  (b) changed to every 8 weeks as of August 2018.   (c) every  12 weeks recommended and ordered to start 01/2020   (6) on 10/20/2014 the patient underwent T2-T3 and T4 decompressive laminectomy with removal of epidural tumor, C7-T4 segmental pedicle screw instrumentation with virage screw system with arrow guidance protocol and C7-T4 posterolateral fusion. The cells were positive for the estrogen receptor. HER-2/neu testing by [facility] showed again equivocal results,  (a) most recent bone scan 10/01/2016 finds no new lesions only postoperative changes  (7) radiation 11/23/2014-12/11/2014.  (a) T1-T5 was treated to 30 Gy in 12 fractions at 2.5 Gy per fraction   (8) initiated close follow-up while considering eventual left lumpectomy or mastectomy depending on  longer-term results of systemic therapy  (a) most recent left breast ultrasonography 10/30/2016 found this mass to measure 0.7 cm  (b) repeat left breast ultrasonography 05/13/2017 showed the upper outer quadrant mass to have grown to 1.4 cm  (c) left breast ultrasound 09/08/2017 shows the mass to now measure 0.9 cm  (d) left breast biopsy x2 on 12/11/2017 shows high-grade ductal carcinoma in situ, essentially estrogen receptor negative  (9) genetics testing using the Breast/Ovarian Cancer Panel through [name], MD) found no deleterious mutations in ATM, BARD1, BRCA1, BRCA2, BRIP1, CDH1, CHEK2, EPCAM, FANCC, MLH1,  MSH2, MSH6, NBN, PALB2, PMS2, PTEN, RAD51C, RAD51D, STK11, TP53, or XRCC2    (10) right thyroid nodule is a complex cyst as noted on CT scan of the neck 01/29/2016  (11) osteoporosis: Bone density at [facility] 09/12/2016 showed a T score of -4.8.  (a) switched to Prolia starting 03/29/2020  (12) fulvestrant started 05/29/2017, last dose 07/22/2018 (discontinued due to pandemic).  (a) started palbociclib 06/29/2017 at 75 mg every other day for 21 days on, 7 days off  (b) palbociclib discontinued on 3/29 due to progressive fatigue and patient preference  (13)  was to start abemaciclib  02/04/2018, but patient opted for going back to palbociclib at a lower dose beginning in November 19, patient subsequently declined s palbociclib, and  proceeded to surgery  (14) status post left lumpectomy 05/03/2018 showing a pT1b pNX invasive ductal carcinoma, grade 2, with equivocal HER-2 results  (a) opted against adjuvant radiation given presence of stage IV disease  (15) tamoxifen supposed to have been started started 09/13/2018 as a "bridge" pending resumption of fulvestrant/denosumab, but never started by the patient  (16) PET scan 11/02/2018 showed no active disease  (a) cerianna scan on 10/31/2019 shows activity in 2 liver lesions, no active bone or lung lesions  (b) abdominal ultrasound 11/08/2019 confirms 2 liver lesions measuring 4.4 and 2.1 cm.  (c) biopsy of 1 of the liver lesions 12/12/2019 confirmed metastatic breast cancer, estrogen and progesterone receptor positive, with an MIB-1 of 10%, and HER-2 positive, with a copy number of 2.12 and the number per cell 6.25  (d) abdominal ultrasound 03/12/2020 shows one of the liver lesions to have increased, while the second lesion has decreased  (17) letrozole started 02/15/2019, discontinued 12/02/2019 with evidence of progression  (a) bone density 09/12/2016 showed a T score of -4.8.  (18) fulvestrant resumed 12/08/2019  (a) palbociclib added at 75 mg daily 21/7 starting 12/20/2019   (b) fulvestrant and palbociclib discontinued September 2022 with evidence of progression.  (19) trastuzumab started 04/25/2020, repeat every 28 days  (a) echo 03/12/2020 shows an ejection fraction in the 60-65% range  (b) changed to subQ formulation of trastuzumab 06/20/2020  (c) trastuzumab discontinued September 2022 with evidence of progression  (20) to start Enhertu on 02/07/2021  (A) echo 11/21/2020 showed an ejection fraction in the 60-65% range  PLAN: Kitt is now 6-1/2 years out from definitive diagnosis of metastatic breast cancer.   Clinically she is very stable.  However on scans she shows clear evidence of progression.  Accordingly we are stopping the trastuzumab fulvestrant and palbociclib that we had been treating her with.  Today we discussed moving to Enhertu.  She has a very good understanding of the possible toxicities side effects and complications of this agent and she also met with our clinical pharmacologist to further discuss possible problems from these treatments.  We also reviewed how she is to take her supportive medicines and specifically the dexamethasone days 2, 3 and 4 and the Compazine as needed.  I have also strongly encouraged her to call us with any questions or concerns as well as to hydrate herself aggressively for a few days after each treatment.  At this point we are hoping she will be able to tolerate this without a port but if necessary we will put a port in for her to facilitate treatment  She will see me on treatment day to clear any remaining questions and then 3 weeks later to assess initial tolerance  Total encounter time 35 minutes.*  [name]. Malaak Stach, MD 01/31/21 4:44 PM Medical Oncology and Hematology [facility] [place],  [address] Tel. [phone]    Fax. [phone]   I, Wilburn Mylar, am acting as scribe for Dr. [name]. Peytyn Trine.  I, Lurline Del MD, have reviewed the above documentation for accuracy and completeness, and I agree with the above.   *Total Encounter Time as defined by the Centers for Medicare and Medicaid Services includes, in addition to the face-to-face time of a patient visit (documented in the note above) non-face-to-face time: obtaining and reviewing outside history, ordering and reviewing medications, tests or procedures, care coordination (communications with other health care professionals or caregivers) and documentation in the medical record.

## 2021-01-31 NOTE — Progress Notes (Signed)
[place] at [facility] [phone] [phone]   Oncology Clinical Pharmacist Practitioner Initial Assessment  April Rosales is a 80 y.o. female with a diagnosis of metastatic breast cancer.  Indication/Regimen fam-trastuzumab deruxtecan-nxki (Enhertu) is being used appropriately for treatment of metastatic breast cancer by Dr. Lurline Del.     Wt Readings from Last 1 Encounters:  01/31/21 115 lb 11.2 oz (52.5 kg)    Estimated body surface area is 1.5 meters squared as calculated from the following:   Height as of an earlier encounter on 01/31/21: '5\' 1"'$  (1.549 m).   Weight as of an earlier encounter on 01/31/21: 115 lb 11.2 oz (52.5 kg).  The dosing regimen is 284 mg (5.4 mg/kg) by intravenous route every 21 days. It is planned to continue until unacceptable toxicity or progression.  It is planned to start on 02/07/21.  Dr. Jana Hakim saw April Rosales today and discussed switching to fam-trastuzumab deruxtecan-nxki after progression on her current regimen of palbociclib/fulvestrant/trastuzumab. Clinical pharmacy was asked to review this medication with April Rosales and discuss potential side effects. We reviewed how fam-trastuzumab deruxtecan-nxki will be administered and the associated pre-medications.  April Rosales has also been prescribed dexamethasone which she will start the day after chemotherapy for 2 days. She has also been prescribed prochlorperazine as needed.  Instructions for taking these medications was provided.  We also reviewed to have a thermometer on hand and to call the clinic should she have a fever which the CDC defines as 100.4 F or higher.  Instructed patient to monitor for signs of pneumonitis, bleeding, diarrhea, and nausea.  Her last ECHO was 11/21/20. Confirmed she does have the 24/7 triage line of [phone].  She will continue every 6 month Prolia per Dr. Jana Hakim. These orders have been entered. She did ask for a refill of her pain  medication tramadol and this request was given to Dr. Jana Hakim.   Dose Modifications None at this time   Allergies Allergies  Allergen Reactions   Ativan [Lorazepam]     Made her crazy   Dilaudid [Hydromorphone Hcl]     Doesn't want   Haldol [Haloperidol Lactate]     Made her crazy    Laboratory Data CBC EXTENDED Latest Ref Rng & Units 01/31/2021 01/03/2021 12/06/2020  WBC 4.0 - 10.5 K/uL 2.8(L) 2.8(L) 2.6(L)  RBC 3.87 - 5.11 MIL/uL 3.79(L) 3.73(L) 3.72(L)  HGB 12.0 - 15.0 g/dL 13.8 13.7 13.6  HCT 36.0 - 46.0 % 39.7 38.8 38.5  PLT 150 - 400 K/uL 149(L) 145(L) 156  NEUTROABS 1.7 - 7.7 K/uL 1.5(L) 1.5(L) 1.5(L)  LYMPHSABS 0.7 - 4.0 K/uL 0.9 0.9 0.8    CMP Latest Ref Rng & Units 01/31/2021 01/03/2021 12/06/2020  Glucose 70 - 99 mg/dL 98 102(H) 98  BUN 8 - 23 mg/dL '15 15 14  '$ Creatinine 0.44 - 1.00 mg/dL 0.80 0.80 0.74  Sodium 135 - 145 mmol/L 141 140 141  Potassium 3.5 - 5.1 mmol/L 4.3 4.7 4.1  Chloride 98 - 111 mmol/L 106 106 106  CO2 22 - 32 mmol/L '26 28 29  '$ Calcium 8.9 - 10.3 mg/dL 10.4(H) 9.9 9.8  Total Protein 6.5 - 8.1 g/dL 6.9 6.8 6.8  Total Bilirubin 0.3 - 1.2 mg/dL 0.4 0.5 0.5  Alkaline Phos 38 - 126 U/L 42 39 40  AST 15 - 41 U/L '21 20 22  '$ ALT 0 - 44 U/L '13 16 13    '$ Contraindications Contraindications were reviewed? Yes Contraindications to therapy were  identified? No   Safety Precautions The following safety precautions for the use of fam-trastuzumab deruxtecan-nxki were reviewed:  ILD / Pneumonitis Neutropenia, anemia, thrombocytopenia LVEF dysfunction N/V/D  Medication Reconciliation Current Outpatient Medications  Medication Sig Dispense Refill   Cholecalciferol (VITAMIN D3 ADULT GUMMIES PO) Take 2,000 Units by mouth daily.      Cyanocobalamin (VITAMIN B-12 PO) Take by mouth daily. Takes every other day     diazepam (VALIUM) 5 MG tablet Take one tablet by mouth just before MRI; may repeat once 10 tablet 0   Iron-Vitamins (GERITOL PO) Take by mouth daily.      OVER THE COUNTER MEDICATION OTC Apple Cider Vinegar 1 capsule daily.     traMADol (ULTRAM) 50 MG tablet Take 1 tablet (50 mg total) by mouth every 12 (twelve) hours as needed. 30 tablet 0   dexamethasone (DECADRON) 4 MG tablet Take 2 tablets (8 mg total) by mouth daily. Start the day after chemotherapy for 2 days. (Patient not taking: Reported on 01/31/2021) 30 tablet 1   prochlorperazine (COMPAZINE) 10 MG tablet Take 1 tablet (10 mg total) by mouth every 6 (six) hours as needed (Nausea or vomiting). (Patient not taking: Reported on 01/31/2021) 30 tablet 1   No current facility-administered medications for this visit.    Medication reconciliation is based on the patient's most recent medication list in the electronic medical record (EMR) including herbal products and OTC medications.   The patient's medication list was reviewed today with the patient? Yes   Drug-drug interactions (DDIs) DDIs were evaluated? Yes DDIs identified? No   Drug-Food Interactions Drug-food interactions were evaluated? Yes Drug-food interactions identified? No   Follow-up Plan  Labs and treatment with fam-trastuzumab deruxtecan-nxki next Thursday 02/07/21 Labs every 21 days prior to fam-trastuzumab deruxtecan-nxki administration Will continue to follow with Dr. Jana Hakim Clinical Pharmacy will continue to assist April Rosales and Dr. Jana Hakim as needed  [name] participated in the discussion, expressed understanding, and voiced agreement with the above plan. All questions were answered to her satisfaction. The patient was advised to contact the clinic at [phone] with any questions or concerns prior to her return visit.   I spent 30 minutes assessing the patient.  [name], RPH-CPP, 01/31/2021 1:33 PM

## 2021-02-07 ENCOUNTER — Inpatient Hospital Stay (HOSPITAL_BASED_OUTPATIENT_CLINIC_OR_DEPARTMENT_OTHER): Payer: Medicare Other | Admitting: Oncology

## 2021-02-07 ENCOUNTER — Inpatient Hospital Stay: Payer: Medicare Other

## 2021-02-07 ENCOUNTER — Telehealth: Payer: Self-pay | Admitting: *Deleted

## 2021-02-07 ENCOUNTER — Other Ambulatory Visit: Payer: Self-pay

## 2021-02-07 VITALS — BP 135/69 | HR 96 | Temp 97.9°F | Resp 16 | Ht 61.0 in | Wt 116.1 lb

## 2021-02-07 DIAGNOSIS — C7951 Secondary malignant neoplasm of bone: Secondary | ICD-10-CM

## 2021-02-07 DIAGNOSIS — Z170 Estrogen receptor positive status [ER+]: Secondary | ICD-10-CM

## 2021-02-07 DIAGNOSIS — C50919 Malignant neoplasm of unspecified site of unspecified female breast: Secondary | ICD-10-CM

## 2021-02-07 DIAGNOSIS — Z5112 Encounter for antineoplastic immunotherapy: Secondary | ICD-10-CM | POA: Diagnosis not present

## 2021-02-07 DIAGNOSIS — C50912 Malignant neoplasm of unspecified site of left female breast: Secondary | ICD-10-CM | POA: Diagnosis not present

## 2021-02-07 DIAGNOSIS — C50412 Malignant neoplasm of upper-outer quadrant of left female breast: Secondary | ICD-10-CM

## 2021-02-07 LAB — CBC WITH DIFFERENTIAL/PLATELET
Abs Immature Granulocytes: 0 10*3/uL (ref 0.00–0.07)
Basophils Absolute: 0.1 10*3/uL (ref 0.0–0.1)
Basophils Relative: 2 %
Eosinophils Absolute: 0.1 10*3/uL (ref 0.0–0.5)
Eosinophils Relative: 2 %
HCT: 38.2 % (ref 36.0–46.0)
Hemoglobin: 13.5 g/dL (ref 12.0–15.0)
Immature Granulocytes: 0 %
Lymphocytes Relative: 32 %
Lymphs Abs: 0.8 10*3/uL (ref 0.7–4.0)
MCH: 36.7 pg — ABNORMAL HIGH (ref 26.0–34.0)
MCHC: 35.3 g/dL (ref 30.0–36.0)
MCV: 103.8 fL — ABNORMAL HIGH (ref 80.0–100.0)
Monocytes Absolute: 0.4 10*3/uL (ref 0.1–1.0)
Monocytes Relative: 15 %
Neutro Abs: 1.3 10*3/uL — ABNORMAL LOW (ref 1.7–7.7)
Neutrophils Relative %: 49 %
Platelets: 134 10*3/uL — ABNORMAL LOW (ref 150–400)
RBC: 3.68 MIL/uL — ABNORMAL LOW (ref 3.87–5.11)
RDW: 14.1 % (ref 11.5–15.5)
WBC: 2.6 10*3/uL — ABNORMAL LOW (ref 4.0–10.5)
nRBC: 0 % (ref 0.0–0.2)

## 2021-02-07 LAB — COMPREHENSIVE METABOLIC PANEL
ALT: 13 U/L (ref 0–44)
AST: 22 U/L (ref 15–41)
Albumin: 3.6 g/dL (ref 3.5–5.0)
Alkaline Phosphatase: 43 U/L (ref 38–126)
Anion gap: 7 (ref 5–15)
BUN: 11 mg/dL (ref 8–23)
CO2: 26 mmol/L (ref 22–32)
Calcium: 10.1 mg/dL (ref 8.9–10.3)
Chloride: 106 mmol/L (ref 98–111)
Creatinine, Ser: 0.7 mg/dL (ref 0.44–1.00)
GFR, Estimated: >60 mL/min (ref >60)
Glucose, Bld: 101 mg/dL — ABNORMAL HIGH (ref 70–99)
Potassium: 4 mmol/L (ref 3.5–5.1)
Sodium: 139 mmol/L (ref 135–145)
Total Bilirubin: 0.4 mg/dL (ref 0.3–1.2)
Total Protein: 6.8 g/dL (ref 6.5–8.1)

## 2021-02-07 MED ORDER — DEXTROSE 5 % IV SOLN: Freq: Once | INTRAVENOUS | Status: AC

## 2021-02-07 MED ORDER — FAM-TRASTUZUMAB DERUXTECAN-NXKI CHEMO 100 MG IV SOLR
5.7000 mg/kg | Freq: Once | INTRAVENOUS | Status: AC
  Administered 2021-02-07: 300 mg via INTRAVENOUS
  Filled 2021-02-07: qty 15

## 2021-02-07 MED ORDER — SODIUM CHLORIDE 0.9 % IV SOLN
10.0000 mg | Freq: Once | INTRAVENOUS | Status: AC
  Administered 2021-02-07: 10 mg via INTRAVENOUS
  Filled 2021-02-07: qty 10

## 2021-02-07 MED ORDER — ACETAMINOPHEN 325 MG PO TABS
650.0000 mg | ORAL_TABLET | Freq: Once | ORAL | Status: AC
  Administered 2021-02-07: 650 mg via ORAL
  Filled 2021-02-07: qty 2

## 2021-02-07 MED ORDER — PALONOSETRON HCL INJECTION 0.25 MG/5ML
0.2500 mg | Freq: Once | INTRAVENOUS | Status: AC
  Administered 2021-02-07: 0.25 mg via INTRAVENOUS
  Filled 2021-02-07: qty 5

## 2021-02-07 MED ORDER — DIPHENHYDRAMINE HCL 25 MG PO CAPS
25.0000 mg | ORAL_CAPSULE | Freq: Once | ORAL | Status: AC
  Administered 2021-02-07: 25 mg via ORAL
  Filled 2021-02-07: qty 1

## 2021-02-07 MED ORDER — HEPARIN SOD (PORK) LOCK FLUSH 100 UNIT/ML IV SOLN: 500.0000 [IU] | Freq: Once | INTRAVENOUS | Status: DC | PRN

## 2021-02-07 MED ORDER — SODIUM CHLORIDE 0.9% FLUSH: 10.0000 mL | INTRAVENOUS | Status: DC | PRN

## 2021-02-07 NOTE — Patient Instructions (Signed)
[facility]COL[facility]  Discharge Instructions: Thank you for choosing [facility] to provide your oncology and hematology care.   If you have a lab appointment with the [facility], please go directly to the [facility] and check in at the registration area.   Wear comfortable clothing and clothing appropriate for easy access to any Portacath or PICC line.   We strive to give you quality time with your provider. You may need to reschedule your appointment if you arrive late (15 or more minutes).  Arriving late affects you and other patients whose appointments are after yours.  Also, if you miss three or more appointments without notifying the office, you may be dismissed from the clinic at the provider's discretion.      For prescription refill requests, have your pharmacy contact our office and allow 72 hours for refills to be completed.    Today you received the following chemotherapy and/or immunotherapy agents : enhertu      To help prevent nausea and vomiting after your treatment, we encourage you to take your nausea medication as directed.  BELOW ARE SYMPTOMS THAT SHOULD BE REPORTED IMMEDIATELY: *FEVER GREATER THAN 100.4 F (38 C) OR HIGHER *CHILLS OR SWEATING *NAUSEA AND VOMITING THAT IS NOT CONTROLLED WITH YOUR NAUSEA MEDICATION *UNUSUAL SHORTNESS OF BREATH *UNUSUAL BRUISING OR BLEEDING *URINARY PROBLEMS (pain or burning when urinating, or frequent urination) *BOWEL PROBLEMS (unusual diarrhea, constipation, pain near the anus) TENDERNESS IN MOUTH AND THROAT WITH OR WITHOUT PRESENCE OF ULCERS (sore throat, sores in mouth, or a toothache) UNUSUAL RASH, SWELLING OR PAIN  UNUSUAL VAGINAL DISCHARGE OR ITCHING   Items with * indicate a potential emergency and should be followed up as soon as possible or go to the Emergency Department if any problems should occur.  Please show the CHEMOTHERAPY ALERT CARD or IMMUNOTHERAPY ALERT CARD at check-in to  the Emergency Department and triage nurse.  Should you have questions after your visit or need to cancel or reschedule your appointment, please contact [facility]  Dept: [phone]  and follow the prompts.  Office hours are 8:00 a.m. to 4:30 p.m. Monday - Friday. Please note that voicemails left after 4:00 p.m. may not be returned until the following business day.  We are closed weekends and major holidays. You have access to a nurse at all times for urgent questions. Please call the main number to the clinic Dept: [phone] and follow the prompts.   For any non-urgent questions, you may also contact your provider using MyChart. We now offer e-Visits for anyone 31 and older to request care online for non-urgent symptoms. For details visit [email or web address].   Also download the MyChart app! Go to the app store, search "MyChart", open the app, select [facility], and log in with your MyChart username and password.  Due to Covid, a mask is required upon entering the hospital/clinic. If you do not have a mask, one will be given to you upon arrival. For doctor visits, patients may have 1 support person aged 55 or older with them. For treatment visits, patients cannot have anyone with them due to current Covid guidelines and our immunocompromised population.   Fam-Trastuzumab deruxtecan injection What is this medication? TRASTUZUMAB DERUXTECAN (tras TOOZ eu mab DER ux TEE kan) is a chemotherapy medicine and a monoclonal antibody. It treats certain types of cancer. Some of the cancers treated are breast cancer and gastric cancer. This medicine may be used for  other purposes; ask your health care provider or pharmacist if you have questions. COMMON BRAND NAME(S): ENHERTU What should I tell my care team before I take this medication? They need to know if you have any of these conditions: heart disease heart failure infection (especially a virus infection such as  chickenpox, cold sores, or herpes) liver disease lung or breathing disease, like asthma an unusual or allergic reaction to fam-trastuzumab deruxtecan, other medications, foods, dyes, or preservatives pregnant or trying to get pregnant breast-feeding How should I use this medication? This medicine is for infusion into a vein. It is given by a health care professional in a hospital or clinic setting. Talk to your pediatrician regarding the use of this medicine in children. Special care may be needed. Overdosage: If you think you have taken too much of this medicine contact a poison control center or emergency room at once. NOTE: This medicine is only for you. Do not share this medicine with others. What if I miss a dose? It is important not to miss your dose. Call your doctor or health care professional if you are unable to keep an appointment. What may interact with this medication? Interaction studies have not been performed. This list may not describe all possible interactions. Give your health care provider a list of all the medicines, herbs, non-prescription drugs, or dietary supplements you use. Also tell them if you smoke, drink alcohol, or use illegal drugs. Some items may interact with your medicine. What should I watch for while using this medication? Visit your healthcare professional for regular checks on your progress. Tell your healthcare professional if your symptoms do not start to get better or if they get worse. Your condition will be monitored carefully while you are receiving this medicine. Do not become pregnant while taking this medicine or for 7 months after stopping it. Women should inform their healthcare professional if they wish to become pregnant or think they might be pregnant. Men should not father a child while taking this medicine and for 4 months after stopping it. There is potential for serious side effects to an unborn child. Talk to your healthcare professional  for more information. Do not breast-feed an infant while taking this medicine or for 7 months after the last dose. This medicine has caused decreased sperm counts in some men. This may make it more difficult to father a child. Talk to your healthcare professional if you are concerned about your fertility. This medicine may increase your risk to bruise or bleed. Call your health care professional if you notice any unusual bleeding. Be careful brushing or flossing your teeth or using a toothpick because you may get an infection or bleed more easily. If you have any dental work done, tell your dentist you are receiving this medicine. This medicine may cause dry eyes [and blurred vision]. If you wear contact lenses, you may feel some discomfort. Lubricating eye drops may help. See your healthcare professional if the problem does not go away or is severe. Call your healthcare professional for advice if you get a fever, chills, or sore throat, or other symptoms of a cold or flu. Do not treat yourself. This medicine decreases your body's ability to fight infections. Try to avoid being around people who are sick. Avoid taking medicines that contain aspirin, acetaminophen, ibuprofen, naproxen, or ketoprofen unless instructed by your healthcare professional. These medicines may hide a fever. What side effects may I notice from receiving this medication? Side effects that you should  report to your doctor or health care professional as soon as possible: allergic reactions like skin rash, itching or hives, swelling of the face, lips, or tongue breathing problems cough nausea, vomiting signs and symptoms of bleeding such as bloody or black, tarry stools; red or dark-brown urine; spitting up blood or brown material that looks like coffee grounds; red spots on the skin; unusual bruising or bleeding from the eye, gums, or nose signs and symptoms of heart failure like breathing problems, fast, irregular heartbeat,  sudden weight gain; swelling of the ankles, feet, hands; unusually weak or tired signs and symptoms of infection like fever; chills; cough; sore throat; pain or trouble passing urine signs and symptoms of low red blood cells or anemia such as unusually weak or tired; feeling faint or lightheaded; falls; breathing problems Side effects that usually do not require medical attention (report these to your doctor or health care professional if they continue or are bothersome): constipation diarrhea dry eyes hair loss loss of appetite mouth sores rash This list may not describe all possible side effects. Call your doctor for medical advice about side effects. You may report side effects to FDA at 1-800-FDA-1088. Where should I keep my medication? This drug is given in a hospital or clinic and will not be stored at home. NOTE: This sheet is a summary. It may not cover all possible information. If you have questions about this medicine, talk to your doctor, pharmacist, or health care provider.  2022 Elsevier/Gold Standard (2019-10-12 16:25:39)

## 2021-02-07 NOTE — Telephone Encounter (Signed)
Per MD review may treat pt today despite noted decreased white blood count.

## 2021-02-07 NOTE — Addendum Note (Signed)
Addended by: Chauncey Cruel on: 02/07/2021 01:57 PM   Modules accepted: Orders

## 2021-02-07 NOTE — Progress Notes (Signed)
[place]  [phone] Fax:([phone]) [phone]    ID: April Rosales DOB: 02/14/1941  MR#: [id number]  VFI#:[id number]  Patient Care Team: [name], MD as PCP - General (Internal Medicine) Konica Stankowski, [name], MD as Consulting Physician (Oncology) [name], MD as Consulting Physician (Orthopedic Surgery) [name], MD as Consulting Physician (General Surgery) [name], MD as Consulting Physician (Cardiology) [name], RPH-CPP (Pharmacist) OTHER: [name], DDS   CHIEF COMPLAINT: Stage IV estrogen receptor positive breast cancer  CURRENT TREATMENT: Enhertu; denosumab/Prolia every 6 months   INTERVAL HISTORY: April Rosales is here today for follow up of her metastatic breast cancer.  She is accompanied by her friend Suzanna Zahn is scheduled to switch to Enhertu today.  She met with our clinical pharmacist Gilford Rile and got quite a bit of information which she has reviewed in detail.  Her most recent echocardiogram on 11/21/2020 showed stability with an ejection fraction of 60-65%.  She will be scheduled for repeat later this month  We also received denosumab/Prolia every 6 months for osteoporosis.  She started this on 03/28/2020.  Her most recent dose was 09/13/2020   REVIEW OF SYSTEMS:  [name] is at baseline.  She is not very active at home, enjoys reading and watching some TV.  She is not exercising regularly.  They have been no intercurrent infections, bleeding, rash, unusual headaches visual changes cough phlegm production pleurisy or shortness of breath.   COVID 19 VACCINATION STATUS: [place] x2, most recently 11/2019, no boosters as of September 2022   BREAST CANCER HISTORY: From the original intake note:   April Rosales underwent right lumpectomy in 1985 at [facility] for what sounds like a stage I breast cancer. She tells me she had more than 30 lymph nodes removed from her right axilla and all of them  were clear. She received  adjuvant radiation but no systemic treatment.  The patient had recently refused mammography with the last mammogram I can find dating back to August 2010.  More recently the patient presented with left scapular pain radiating down the left arm.  She was evaluated by Dr. [name], who obtained a chest x-ray showing a possible abnormality at T3. He then set up the patient for an MRI of the thoracic spine performed 08/16/2014.  This showed multiple compression fractures  (a similar picture had been noted on lumbar MRI 10/09/2011 ). However at T3 they noted tumor in the vertebral body extending into the left pedicle and into the lateral epidural space, displacing the cord to the right. There was no evidence of cord compression or cord signal abnormality. There were no other areas of tumor identified in the thoracic spine.         The patient was then referred to Dr. Hal Neer who on 08/24/2014 set [name] up for CT scans of the chest, abdomen and pelvis. There was a dense mass in the upper inner quadrant of the left breast measuring 1.6 cm. There was a 1.2 cm nodule in the minor fissure of the right lung and some evidence of right lung fibrosis at the site of the prior radiation port.  There was also a thyroid mass measuring 2 cm. However there were no parenchymal lung or liver lesions. Incidental meningoceles were noted as well as sclerosis of the fifth and sixth ribs which were felt to be likely posttraumatic.   On 08/29/2014 the patient underwent bilateral diagnostic mammography with tomosynthesis and left breast ultrasonography.  There were postsurgical changes in the upper right breast. In the left breast there was an irregular mass measuring 2.3 cm in the upper inner quadrant. This was palpable. Ultrasound showed this to be hypoechoic and to measure 2.0 cm. There were adjacent areas of nodularity. There was no definite lymphadenopathy in the left axilla.  Biopsy of this breast mass  08/29/2014 showed an invasive adenocarcinoma with both ductal and lobular features (there was strong diffuse E-cadherin expression as well as areas with total absence of E-cadherin expression), with the preliminary prognostic profile showing strong estrogen positivity, very weak to near absent progesterone positivity, an MIB-1 of approximately 40%, and HER-2 equivocal  The patient's subsequent history is as detailed below.   PAST MEDICAL HISTORY: Past Medical History:  Diagnosis Date   Arthritis    Breast cancer ([place]) 1985/2016   takes [name] daily   Chronic back pain    stenosis/listhesis   Family history of ovarian cancer    Osteoporosis    takes Vit D   Radiation 11/23/14-12/11/14   30 Gy T1-T5     PAST SURGICAL HISTORY: Past Surgical History:  Procedure Laterality Date   ABDOMINAL HYSTERECTOMY     1980   APPENDECTOMY  9528   APPLICATION OF INTRAOPERATIVE CT SCAN N/A 10/20/2014   Procedure: APPLICATION OF INTRAOPERATIVE CAT SCAN;  Surgeon: [name], MD;  Location: [facility];  Service: Neurosurgery;  Laterality: N/A;   BREAST LUMPECTOMY WITH RADIOACTIVE SEED LOCALIZATION Left 05/03/2018   Procedure: LEFT BREAST LUMPECTOMY WITH BRACKETED RADIOACTIVE SEED LOCALIZATION;  Surgeon: [name], MD;  Location: [facility];  Service: General;  Laterality: Left;   BREAST SURGERY Right 1985   THYROID CYST EXCISION  1967    FAMILY HISTORY Family History  Problem Relation Age of Onset   Heart disease Mother    Hypertension Mother    Heart disease Father    Diabetes Father    Breast cancer Paternal Aunt        4 paternal aunts with breast cancer over 65   Prostate cancer Paternal Uncle    Stroke Paternal Grandfather    Ovarian cancer Paternal Aunt    Huntington's disease Other        Nephew, inherited from his father  The patient's father died from a heart attack at the age of 64. He had 9 sisters. 3 of those sisters had breast cancer, all in a menopausal  setting. Another sister had ovarian cancer. One of the paternal uncles had cancer of the colon "and back".  The patient's mother died at the age of 50. She was found to have breast cancer shortly before dying , during her final hospitalization.   GYNECOLOGIC HISTORY:  No LMP recorded. Patient has had a hysterectomy.  Menarche age 37, first live birth age 57, the patient is GX P1. She underwent hysterectomy in 1980. She thinks the ovaries were removed, but the CT scan obtained 08/24/2014 showed a definite right ovary. The left ovary may have been removed. She did not take hormone replacement after the hysterectomy.   SOCIAL HISTORY:   Mahima worked in Psychiatric nurse. She is divorced, lives alone, with 2 cats. Her son Bethann Berkshire lives in [place] where he works in Engineer, technical sales. He has 4 children of his own. The patient is a Psychologist, forensic.   ADVANCED DIRECTIVES:  In place. The patient has named her sister Pleas Koch [phone]) and her friend Janina Mayo [phone]) as joint healthcare powers of attorney   HEALTH MAINTENANCE:  Social History   Tobacco Use   Smoking status: Former    Packs/day: 0.50    Years: 10.00    Pack years: 5.00    Types: Cigarettes    Quit date: 05/03/1970    Years since quitting: 50.8   Smokeless tobacco: Former   Tobacco comments:    quit smoking 1970's  Substance Use Topics   Alcohol use: Yes    Alcohol/week: 0.0 standard drinks    Comment: Rare   Drug use: No     Colonoscopy: never  PAP: status post hysterectomy  Bone density: 09/12/2016 [place]/ T score of -4.8 osteoporosis  Lipid panel:  Allergies  Allergen Reactions   Ativan [Lorazepam]     Made her crazy   Dilaudid [Hydromorphone Hcl]     Doesn't want   Haldol [Haloperidol Lactate]     Made her crazy    Current Outpatient Medications  Medication Sig Dispense Refill   Cholecalciferol (VITAMIN D3 ADULT GUMMIES PO) Take 2,000 Units by mouth daily.      Cyanocobalamin (VITAMIN B-12  PO) Take by mouth daily. Takes every other day     dexamethasone (DECADRON) 4 MG tablet Take 2 tablets (8 mg total) by mouth daily. Start the day after chemotherapy for 2 days. (Patient not taking: Reported on 01/31/2021) 30 tablet 1   diazepam (VALIUM) 5 MG tablet Take one tablet by mouth just before MRI; may repeat once 10 tablet 0   Iron-Vitamins (GERITOL PO) Take by mouth daily.     OVER THE COUNTER MEDICATION OTC Apple Cider Vinegar 1 capsule daily.     prochlorperazine (COMPAZINE) 10 MG tablet Take 1 tablet (10 mg total) by mouth every 6 (six) hours as needed (Nausea or vomiting). (Patient not taking: Reported on 01/31/2021) 30 tablet 1   traMADol (ULTRAM) 50 MG tablet Take 1 tablet (50 mg total) by mouth every 12 (twelve) hours as needed. 30 tablet 0   No current facility-administered medications for this visit.    OBJECTIVE: White woman who appears stated age  There were no vitals filed for this visit.  Wt Readings from Last 3 Encounters:  01/31/21 115 lb 11.2 oz (52.5 kg)  12/06/20 117 lb 1.9 oz (53.1 kg)  11/08/20 119 lb 6.4 oz (54.2 kg)   There is no height or weight on file to calculate BMI.    ECOG FS:1 - Symptomatic but completely ambulatory  Sclerae unicteric, EOMs intact Wearing a mask No cervical or supraclavicular adenopathy Lungs no rales or rhonchi Heart regular rate and rhythm Abd soft, nontender, positive bowel sounds MSK kyphosis but no focal spinal tenderness, no upper extremity lymphedema Neuro: nonfocal, well oriented, appropriate affect Breasts: Deferred   LAB RESULTS:  CMP     Component Value Date/Time   NA 141 01/31/2021 1157   NA 141 05/29/2017 1417   K 4.3 01/31/2021 1157   K 3.6 05/29/2017 1417   CL 106 01/31/2021 1157   CO2 26 01/31/2021 1157   CO2 27 05/29/2017 1417   GLUCOSE 98 01/31/2021 1157   GLUCOSE 111 05/29/2017 1417   BUN 15 01/31/2021 1157   BUN 17.0 05/29/2017 1417   CREATININE 0.80 01/31/2021 1157   CREATININE 0.66  03/05/2020 1218   CREATININE 0.8 05/29/2017 1417   CALCIUM 10.4 (H) 01/31/2021 1157   CALCIUM 10.7 (H) 06/26/2017 1216   CALCIUM 10.4 05/29/2017 1417   PROT 6.9 01/31/2021 1157   PROT 7.3 05/29/2017 1417   ALBUMIN 3.6 01/31/2021 1157   ALBUMIN  3.9 05/29/2017 1417   AST 21 01/31/2021 1157   AST 34 12/02/2019 1147   AST 17 05/29/2017 1417   ALT 13 01/31/2021 1157   ALT 30 12/02/2019 1147   ALT 15 05/29/2017 1417   ALKPHOS 42 01/31/2021 1157   ALKPHOS 42 05/29/2017 1417   BILITOT 0.4 01/31/2021 1157   BILITOT 0.4 12/02/2019 1147   BILITOT 0.30 05/29/2017 1417   GFRNONAA >60 01/31/2021 1157   GFRNONAA 83 03/05/2020 1218   GFRAA 97 03/05/2020 1218    INo results found for: SPEP, UPEP  Lab Results  Component Value Date   WBC 2.8 (L) 01/31/2021   NEUTROABS 1.5 (L) 01/31/2021   HGB 13.8 01/31/2021   HCT 39.7 01/31/2021   MCV 104.7 (H) 01/31/2021   PLT 149 (L) 01/31/2021      Chemistry      Component Value Date/Time   NA 141 01/31/2021 1157   NA 141 05/29/2017 1417   K 4.3 01/31/2021 1157   K 3.6 05/29/2017 1417   CL 106 01/31/2021 1157   CO2 26 01/31/2021 1157   CO2 27 05/29/2017 1417   BUN 15 01/31/2021 1157   BUN 17.0 05/29/2017 1417   CREATININE 0.80 01/31/2021 1157   CREATININE 0.66 03/05/2020 1218   CREATININE 0.8 05/29/2017 1417      Component Value Date/Time   CALCIUM 10.4 (H) 01/31/2021 1157   CALCIUM 10.7 (H) 06/26/2017 1216   CALCIUM 10.4 05/29/2017 1417   ALKPHOS 42 01/31/2021 1157   ALKPHOS 42 05/29/2017 1417   AST 21 01/31/2021 1157   AST 34 12/02/2019 1147   AST 17 05/29/2017 1417   ALT 13 01/31/2021 1157   ALT 30 12/02/2019 1147   ALT 15 05/29/2017 1417   BILITOT 0.4 01/31/2021 1157   BILITOT 0.4 12/02/2019 1147   BILITOT 0.30 05/29/2017 1417       Lab Results  Component Value Date   LABCA2 24 01/01/2016    No components found for: [id number]  No results for input(s): INR in the last 168 hours.  Urinalysis    Component Value  Date/Time   COLORURINE YELLOW 03/05/2020 [place] 03/05/2020 1218   LABSPEC 1.006 03/05/2020 [place] 7.0 03/05/2020 [place] 03/05/2020 [place] 03/05/2020 [place] 09/19/2015 [place] 03/05/2020 [place] 03/05/2020 1218   NITRITE NEGATIVE 03/05/2020 1218   LEUKOCYTESUR 2+ (A) 03/05/2020 1218    STUDIES: MR Abdomen W Wo Contrast  Result Date: 01/25/2021 CLINICAL DATA:  Metastatic breast carcinoma. EXAM: MRI ABDOMEN WITHOUT AND WITH CONTRAST TECHNIQUE: Multiplanar multisequence MR imaging of the abdomen was performed both before and after the administration of intravenous contrast. CONTRAST:  55mL GADAVIST GADOBUTROL 1 MMOL/ML IV SOLN COMPARISON:  PET-CT on 10/28/2019, and ultrasound on 11/05/2020 FINDINGS: Image degradation by motion artifact noted. Lower chest: No acute findings. Hepatobiliary: Numerous hypovascular masses are seen throughout the right and left lobes, consistent with diffuse liver metastases. Largest mass in the liver dome measures 5.7 x 4.3 cm. These show increase in size and number compared to previous ultrasound and PET-CT exams. Multiple gallstones are seen, however there is no evidence of cholecystitis or biliary ductal dilatation. Pancreas:  No mass or inflammatory changes. Spleen:  Within normal limits in size and appearance. Adrenals/Urinary Tract: No adrenal or renal masses identified. Tiny left upper pole renal cyst noted. No evidence of hydronephrosis. Stomach/Bowel: Visualized portion unremarkable.  Vascular/Lymphatic: No pathologically enlarged lymph nodes identified. No acute vascular findings. Other:  None. Musculoskeletal:  No suspicious bone lesions identified. IMPRESSION: Diffuse liver metastases, increased in size and number compared to previous ultrasound and PET-CT exams. No other sites of metastatic disease within the abdomen. Cholelithiasis. No radiographic  evidence of cholecystitis or biliary ductal dilatation. Electronically Signed   By: Marlaine Hind M.D.   On: 01/25/2021 16:38      ASSESSMENT: 80 y.o. [place] woman with stage IV left breast cancer involving bone  (1) status post right lumpectomy and axillary node dissection in 1985 followed by radiation at [facility] 08/16/2014: measurable disease in spine, lung and left breast   (2) evaluation for left shoulder pain led to thoracic spine MRI 08/16/2014 showing a pathologic fracture at T3 with epidural tumor displacing the cord to the right, but no cord compression. CT scans of the chest, abdomen and pelvis 08/24/2014 showed in addition a mass in the upper outer quadrant left breast measuring 1.6 cm and a nodule in the minor fissure of the right lung measuring 1.2 cm, but no parenchymal lung or liver lesions.   (a) CA 27-29 was noninformative at 38 (09/20/2014)    (3) mammography and ultrasonography 08/29/2014 show a mass in the upper inner left breast which was palpable,  measuring 2.0 cm by ultrasound. Biopsy of this mass 08/29/2014 showed an invasive breast cancer with both lobular and ductal features, estrogen receptor positive, progesterone receptor weakly positive, with an MIB-1 in the 40% range, HER-2 equivocal (6 else ratio 1.5, but average number her nucleus 5.8)   (4) letrozole started 08/31/2014;   (a) palbociclib added Sept 2016 at 75 mg 21/7, with significant neutropenia; not repeated after first cycle  (b) letrozole discontinued 05/29/2017 with evidence of disease progression in the breast   (5)  zolendronate started 09/20/2014, stopped after initial dose due to poor tolerance  (a) denosumab/ Xgeva started 01/31/2015, repeated every 4 weeks  (b) changed to every 8 weeks as of August 2018.   (c) every 12 weeks recommended and ordered to start 01/2020   (6) on 10/20/2014 the patient underwent T2-T3 and T4 decompressive laminectomy with removal of epidural tumor,  C7-T4 segmental pedicle screw instrumentation with virage screw system with arrow guidance protocol and C7-T4 posterolateral fusion. The cells were positive for the estrogen receptor. HER-2/neu testing by [facility] showed again equivocal results,  (a) most recent bone scan 10/01/2016 finds no new lesions only postoperative changes  (7) radiation 11/23/2014-12/11/2014.  (a) T1-T5 was treated to 30 Gy in 12 fractions at 2.5 Gy per fraction   (8) initiated close follow-up while considering eventual left lumpectomy or mastectomy depending on  longer-term results of systemic therapy  (a) most recent left breast ultrasonography 10/30/2016 found this mass to measure 0.7 cm  (b) repeat left breast ultrasonography 05/13/2017 showed the upper outer quadrant mass to have grown to 1.4 cm  (c) left breast ultrasound 09/08/2017 shows the mass to now measure 0.9 cm  (d) left breast biopsy x2 on 12/11/2017 shows high-grade ductal carcinoma in situ, essentially estrogen receptor negative  (9) genetics testing using the Breast/Ovarian Cancer Panel through [facility] [name], MD) found no deleterious mutations in ATM, BARD1, BRCA1, BRCA2, BRIP1, CDH1, CHEK2, EPCAM, FANCC, MLH1, MSH2, MSH6, NBN, PALB2, PMS2, PTEN, RAD51C, RAD51D, STK11, TP53, or XRCC2    (10) right thyroid nodule is a complex cyst as noted on CT scan of the neck 01/29/2016  (11) osteoporosis: [facility] density at [facility]  09/12/2016 showed a T score of -4.8.  (a) switched to Prolia starting 03/29/2020  (12) fulvestrant started 05/29/2017, last dose 07/22/2018 (discontinued due to pandemic).  (a) started palbociclib 06/29/2017 at 75 mg every other day for 21 days on, 7 days off  (b) palbociclib discontinued on 3/29 due to progressive fatigue and patient preference  (13)  was to start abemaciclib 02/04/2018, but patient opted for going back to palbociclib at a lower dose beginning in November 19, patient subsequently declined s palbociclib, and  proceeded to  surgery  (14) status post left lumpectomy 05/03/2018 showing a pT1b pNX invasive ductal carcinoma, grade 2, with equivocal HER-2 results  (a) opted against adjuvant radiation given presence of stage IV disease  (15) tamoxifen supposed to have been started started 09/13/2018 as a "bridge" pending resumption of fulvestrant/denosumab, but never started by the patient  (16) PET scan 11/02/2018 showed no active disease  (a) cerianna scan on 10/31/2019 shows activity in 2 liver lesions, no active bone or lung lesions  (b) abdominal ultrasound 11/08/2019 confirms 2 liver lesions measuring 4.4 and 2.1 cm.  (c) biopsy of 1 of the liver lesions 12/12/2019 confirmed metastatic breast cancer, estrogen and progesterone receptor positive, with an MIB-1 of 10%, and HER-2 positive, with a copy number of 2.12 and the number per cell 6.25  (d) abdominal ultrasound 03/12/2020 shows one of the liver lesions to have increased, while the second lesion has decreased  (17) letrozole started 02/15/2019, discontinued 12/02/2019 with evidence of progression  (a) bone density 09/12/2016 showed a T score of -4.8.  (18) fulvestrant resumed 12/08/2019  (a) palbociclib added at 75 mg daily 21/7 starting 12/20/2019   (b) fulvestrant and palbociclib discontinued September 2022 with evidence of progression.  (19) trastuzumab started 04/25/2020, repeat every 28 days  (a) echo 03/12/2020 shows an ejection fraction in the 60-65% range  (b) changed to subQ formulation of trastuzumab 06/20/2020  (c) trastuzumab discontinued September 2022 with MRI of the abdomen showing evidence of progression  (20) starting Enhertu on 02/07/2021  (A) echo 11/21/2020 showed an ejection fraction in the 60-65% range   PLAN: Uzma is now 6-1/2 years out from definitive diagnosis of metastatic breast cancer.  Clinically she is very stable and she reports no symptoms related to her disease.  In fact he feels better now that she is off the  [place].  She will be starting Enhertu today.  She has discussed this thoroughly with our clinical pharmacologist and she reviewed it again with me today.  She knows exactly how to take her supportive medicines and she has them on hand.  The only concern I have is that her white cell count is still a bit low from the [place].  We are proceeding with treatment today in any case, but I am adding lab work next Wednesday just to make sure her counts remain adequate.  She will see [place] again with her second treatment and she will specifically see me again with her third treatment.  After 4 doses she will be restaged  Total encounter time 25 minutes.Sarajane Jews C. Kenecia Barren, MD 02/07/21 4:40 AM Medical Oncology and Hematology [facility] [place], [place] [address] Tel. [phone]    Fax. [phone]   I, Wilburn Mylar, am acting as scribe for Dr. [name]. Siddiq Kaluzny.  I, Lurline Del MD, have reviewed the above documentation for accuracy and completeness, and I agree with the above.   *Total Encounter Time as defined by  the Centers for Medicare and Medicaid Services includes, in addition to the face-to-face time of a patient visit (documented in the note above) non-face-to-face time: obtaining and reviewing outside history, ordering and reviewing medications, tests or procedures, care coordination (communications with other health care professionals or caregivers) and documentation in the medical record.

## 2021-02-13 ENCOUNTER — Inpatient Hospital Stay: Payer: Medicare Other

## 2021-02-13 ENCOUNTER — Other Ambulatory Visit: Payer: Self-pay

## 2021-02-13 ENCOUNTER — Telehealth: Payer: Self-pay | Admitting: *Deleted

## 2021-02-13 DIAGNOSIS — C50919 Malignant neoplasm of unspecified site of unspecified female breast: Secondary | ICD-10-CM

## 2021-02-13 DIAGNOSIS — C7951 Secondary malignant neoplasm of bone: Secondary | ICD-10-CM

## 2021-02-13 DIAGNOSIS — Z5112 Encounter for antineoplastic immunotherapy: Secondary | ICD-10-CM | POA: Diagnosis not present

## 2021-02-13 DIAGNOSIS — C50412 Malignant neoplasm of upper-outer quadrant of left female breast: Secondary | ICD-10-CM

## 2021-02-13 DIAGNOSIS — C50912 Malignant neoplasm of unspecified site of left female breast: Secondary | ICD-10-CM | POA: Diagnosis not present

## 2021-02-13 LAB — CBC WITH DIFFERENTIAL/PLATELET
Abs Immature Granulocytes: 0.04 10*3/uL (ref 0.00–0.07)
Basophils Absolute: 0 10*3/uL (ref 0.0–0.1)
Basophils Relative: 1 %
Eosinophils Absolute: 0.2 10*3/uL (ref 0.0–0.5)
Eosinophils Relative: 5 %
HCT: 38.8 % (ref 36.0–46.0)
Hemoglobin: 13.7 g/dL (ref 12.0–15.0)
Immature Granulocytes: 1 %
Lymphocytes Relative: 21 %
Lymphs Abs: 0.8 10*3/uL (ref 0.7–4.0)
MCH: 36.3 pg — ABNORMAL HIGH (ref 26.0–34.0)
MCHC: 35.3 g/dL (ref 30.0–36.0)
MCV: 102.9 fL — ABNORMAL HIGH (ref 80.0–100.0)
Monocytes Absolute: 0.1 10*3/uL (ref 0.1–1.0)
Monocytes Relative: 4 %
Neutro Abs: 2.5 10*3/uL (ref 1.7–7.7)
Neutrophils Relative %: 68 %
Platelets: 170 10*3/uL (ref 150–400)
RBC: 3.77 MIL/uL — ABNORMAL LOW (ref 3.87–5.11)
RDW: 13.5 % (ref 11.5–15.5)
WBC: 3.7 10*3/uL — ABNORMAL LOW (ref 4.0–10.5)
nRBC: 0 % (ref 0.0–0.2)

## 2021-02-13 LAB — COMPREHENSIVE METABOLIC PANEL
ALT: 28 U/L (ref 0–44)
AST: 30 U/L (ref 15–41)
Albumin: 3.4 g/dL — ABNORMAL LOW (ref 3.5–5.0)
Alkaline Phosphatase: 44 U/L (ref 38–126)
Anion gap: 8 (ref 5–15)
BUN: 15 mg/dL (ref 8–23)
CO2: 28 mmol/L (ref 22–32)
Calcium: 10.1 mg/dL (ref 8.9–10.3)
Chloride: 101 mmol/L (ref 98–111)
Creatinine, Ser: 0.73 mg/dL (ref 0.44–1.00)
GFR, Estimated: >60 mL/min (ref >60)
Glucose, Bld: 103 mg/dL — ABNORMAL HIGH (ref 70–99)
Potassium: 4.4 mmol/L (ref 3.5–5.1)
Sodium: 137 mmol/L (ref 135–145)
Total Bilirubin: 0.6 mg/dL (ref 0.3–1.2)
Total Protein: 6.5 g/dL (ref 6.5–8.1)

## 2021-02-13 NOTE — Telephone Encounter (Signed)
This RN spoke with pt post lab check - with noted improvement in [facility].  April Rosales stated since receiving the therapy on 9/15 she is having several symptoms :  Bad breath Bloating Feeling constipated ( but having bowel movements ) Mid thoracic pains " when I lay down sometimes " Weakness Dizziness.  She states appetite is down " because I feel so bloated in my stomach that I don't want to eat "  This RN discussed above symptoms and recommended :  Salt water rinses = 4 times a day for bad breath and decreased taste Gas X ( generic ) for bloating - use as needed Sennakot S for constipation to help her move better with softer stools Pepcid 20 mg 1 tab daily for mid thoracic discomfort likely GERD secondary to steroid use.  Weakness and dizziness need to be monitored and hopefully with treating other symptoms she will be able to hydrate and eat better which may help with these 2 issues.  This RN asked pt to call this RN on Friday with update on her symptoms and if she feels worse tomorrow she is to call then.  Above information given to pt and friend in writing with verbalized understanding of plan.  No further needs at present.

## 2021-02-27 MED FILL — Dexamethasone Sodium Phosphate Inj 100 MG/10ML: INTRAMUSCULAR | Qty: 1 | Status: AC

## 2021-02-28 ENCOUNTER — Inpatient Hospital Stay: Payer: Medicare Other | Attending: Oncology

## 2021-02-28 ENCOUNTER — Other Ambulatory Visit: Payer: Self-pay

## 2021-02-28 ENCOUNTER — Inpatient Hospital Stay: Payer: Medicare Other | Admitting: Pharmacist

## 2021-02-28 ENCOUNTER — Other Ambulatory Visit: Payer: Self-pay | Admitting: *Deleted

## 2021-02-28 ENCOUNTER — Inpatient Hospital Stay: Payer: Medicare Other

## 2021-02-28 VITALS — HR 99

## 2021-02-28 VITALS — BP 147/77 | HR 116 | Temp 98.4°F | Resp 18 | Ht 61.0 in | Wt 114.1 lb

## 2021-02-28 DIAGNOSIS — C50919 Malignant neoplasm of unspecified site of unspecified female breast: Secondary | ICD-10-CM

## 2021-02-28 DIAGNOSIS — Z5112 Encounter for antineoplastic immunotherapy: Secondary | ICD-10-CM | POA: Diagnosis not present

## 2021-02-28 DIAGNOSIS — C7951 Secondary malignant neoplasm of bone: Secondary | ICD-10-CM

## 2021-02-28 DIAGNOSIS — M810 Age-related osteoporosis without current pathological fracture: Secondary | ICD-10-CM

## 2021-02-28 DIAGNOSIS — Z5111 Encounter for antineoplastic chemotherapy: Secondary | ICD-10-CM

## 2021-02-28 DIAGNOSIS — Z170 Estrogen receptor positive status [ER+]: Secondary | ICD-10-CM

## 2021-02-28 DIAGNOSIS — Z79899 Other long term (current) drug therapy: Secondary | ICD-10-CM

## 2021-02-28 DIAGNOSIS — Z09 Encounter for follow-up examination after completed treatment for conditions other than malignant neoplasm: Secondary | ICD-10-CM

## 2021-02-28 DIAGNOSIS — Z5181 Encounter for therapeutic drug level monitoring: Secondary | ICD-10-CM

## 2021-02-28 DIAGNOSIS — C50912 Malignant neoplasm of unspecified site of left female breast: Secondary | ICD-10-CM | POA: Insufficient documentation

## 2021-02-28 LAB — CBC WITH DIFFERENTIAL/PLATELET
Abs Immature Granulocytes: 0.02 10*3/uL (ref 0.00–0.07)
Basophils Absolute: 0.1 10*3/uL (ref 0.0–0.1)
Basophils Relative: 1 %
Eosinophils Absolute: 0.6 10*3/uL — ABNORMAL HIGH (ref 0.0–0.5)
Eosinophils Relative: 12 %
HCT: 41.2 % (ref 36.0–46.0)
Hemoglobin: 14.3 g/dL (ref 12.0–15.0)
Immature Granulocytes: 0 %
Lymphocytes Relative: 17 %
Lymphs Abs: 0.9 10*3/uL (ref 0.7–4.0)
MCH: 35.6 pg — ABNORMAL HIGH (ref 26.0–34.0)
MCHC: 34.7 g/dL (ref 30.0–36.0)
MCV: 102.5 fL — ABNORMAL HIGH (ref 80.0–100.0)
Monocytes Absolute: 0.7 10*3/uL (ref 0.1–1.0)
Monocytes Relative: 14 %
Neutro Abs: 2.8 10*3/uL (ref 1.7–7.7)
Neutrophils Relative %: 56 %
Platelets: 279 10*3/uL (ref 150–400)
RBC: 4.02 MIL/uL (ref 3.87–5.11)
RDW: 13.9 % (ref 11.5–15.5)
WBC: 5.1 10*3/uL (ref 4.0–10.5)
nRBC: 0 % (ref 0.0–0.2)

## 2021-02-28 LAB — COMPREHENSIVE METABOLIC PANEL
ALT: 21 U/L (ref 0–44)
AST: 30 U/L (ref 15–41)
Albumin: 3.5 g/dL (ref 3.5–5.0)
Alkaline Phosphatase: 51 U/L (ref 38–126)
Anion gap: 8 (ref 5–15)
BUN: 9 mg/dL (ref 8–23)
CO2: 26 mmol/L (ref 22–32)
Calcium: 10.1 mg/dL (ref 8.9–10.3)
Chloride: 105 mmol/L (ref 98–111)
Creatinine, Ser: 0.69 mg/dL (ref 0.44–1.00)
GFR, Estimated: >60 mL/min (ref >60)
Glucose, Bld: 103 mg/dL — ABNORMAL HIGH (ref 70–99)
Potassium: 4.1 mmol/L (ref 3.5–5.1)
Sodium: 139 mmol/L (ref 135–145)
Total Bilirubin: 0.4 mg/dL (ref 0.3–1.2)
Total Protein: 6.9 g/dL (ref 6.5–8.1)

## 2021-02-28 MED ORDER — PALONOSETRON HCL INJECTION 0.25 MG/5ML
0.2500 mg | Freq: Once | INTRAVENOUS | Status: AC
  Administered 2021-02-28: 0.25 mg via INTRAVENOUS
  Filled 2021-02-28: qty 5

## 2021-02-28 MED ORDER — SODIUM CHLORIDE 0.9 % IV SOLN
10.0000 mg | Freq: Once | INTRAVENOUS | Status: AC
  Administered 2021-02-28: 10 mg via INTRAVENOUS
  Filled 2021-02-28: qty 10

## 2021-02-28 MED ORDER — FAM-TRASTUZUMAB DERUXTECAN-NXKI CHEMO 100 MG IV SOLR
5.7000 mg/kg | Freq: Once | INTRAVENOUS | Status: AC
  Administered 2021-02-28: 300 mg via INTRAVENOUS
  Filled 2021-02-28: qty 15

## 2021-02-28 MED ORDER — DEXTROSE 5 % IV SOLN: Freq: Once | INTRAVENOUS | Status: AC

## 2021-02-28 MED ORDER — ACETAMINOPHEN 325 MG PO TABS
650.0000 mg | ORAL_TABLET | Freq: Once | ORAL | Status: AC
  Administered 2021-02-28: 650 mg via ORAL
  Filled 2021-02-28: qty 2

## 2021-02-28 MED ORDER — DENOSUMAB 60 MG/ML ~~LOC~~ SOSY
60.0000 mg | PREFILLED_SYRINGE | Freq: Once | SUBCUTANEOUS | Status: AC
  Administered 2021-02-28: 60 mg via SUBCUTANEOUS
  Filled 2021-02-28: qty 1

## 2021-02-28 MED ORDER — DIPHENHYDRAMINE HCL 25 MG PO CAPS
25.0000 mg | ORAL_CAPSULE | Freq: Once | ORAL | Status: AC
  Administered 2021-02-28: 25 mg via ORAL
  Filled 2021-02-28: qty 1

## 2021-02-28 NOTE — Progress Notes (Signed)
Last echo 11/21/20.  Per MD ok to treat today.  Pt has an appt 03/05/21 for echo at 1000.  Pt is aware.

## 2021-02-28 NOTE — Progress Notes (Signed)
[place]       Telephone: [phone]?Fax: [phone]   Oncology Clinical Pharmacist Practitioner Progress Note  April Rosales was contacted via in-person visit to discuss her chemotherapy regimen for fam-trastuzumab deruxtecan which she receives under the care of April Rosales. She also receives every 6 month Prolia due to tolerability issues with Delton See in the past.  Prolia is due today.  She is here for cycle 2 today with cycle 3 planned with 3 weeks  Interval History She continues on fam-trastuzumab deruxtecan, 284 mg, by IV route. Fam-trastuzumab deruxtecan is given on day 1 of a 21 day cycle. Therapy is planned to continue until disease progression or unacceptable toxicity. Cycle 1, day 1 start date was 02/07/21.   Response to Therapy April Rosales is doing well.  Her only major complaint is fatigue.  Heart rate was 116 today. She is resting when able. She says her appetite has not been as good as before and has lost 2 pounds since we saw her last. Encouraged to use her meal supplement shakes that she has at home and continue to drink plenty of fluids. She had some constipation for the first 2 days after her first cycle which resolved.  She is having a bowel movement about once a day and they are soft but not loose.  She has tramadol for pain and continues to take her dexamethasone as prescribed for nausea.  She also has prochlorperazine PRN. She has not had changes in her current medication list. She lives alone but has friends in the area that check on her. She will have labs in 3 weeks and see April Rosales prior to cycle 3 of Fam-trastuzumab deruxtecan.  She asked if needed, could she delay 1 week next cycle. Recommended to try to have Fam-trastuzumab deruxtecan every 3 weeks but discuss with April Rosales if he is comfortable moving it out to every 4 weeks if needed. Labs were reviewed with April Rosales today and both patient and provider are in agreement to continue  therapy at this time.  Allergies Allergies  Allergen Reactions   Ativan [Lorazepam]     Made her crazy   Dilaudid [Hydromorphone Hcl]     Doesn't want   Haldol [Haloperidol Lactate]     Made her crazy    Vitals Vitals:   02/28/21 1242  BP: (!) 147/77  Pulse: (!) 116  Resp: 18  Temp: 98.4 F (36.9 C)  SpO2: 98%    Laboratory Data CBC EXTENDED Latest Ref Rng & Units 02/28/2021 02/13/2021 02/07/2021  WBC 4.0 - 10.5 K/uL 5.1 3.7(L) 2.6(L)  RBC 3.87 - 5.11 MIL/uL 4.02 3.77(L) 3.68(L)  HGB 12.0 - 15.0 g/dL 14.3 13.7 13.5  HCT 36.0 - 46.0 % 41.2 38.8 38.2  PLT 150 - 400 K/uL 279 170 134(L)  NEUTROABS 1.7 - 7.7 K/uL 2.8 2.5 1.3(L)  LYMPHSABS 0.7 - 4.0 K/uL 0.9 0.8 0.8    CMP Latest Ref Rng & Units 02/28/2021 02/13/2021 02/07/2021  Glucose 70 - 99 mg/dL 103(H) 103(H) 101(H)  BUN 8 - 23 mg/dL 9 15 11   Creatinine 0.44 - 1.00 mg/dL 0.69 0.73 0.70  Sodium 135 - 145 mmol/L 139 137 139  Potassium 3.5 - 5.1 mmol/L 4.1 4.4 4.0  Chloride 98 - 111 mmol/L 105 101 106  CO2 22 - 32 mmol/L 26 28 26   Calcium 8.9 - 10.3 mg/dL 10.1 10.1 10.1  Total Protein 6.5 - 8.1 g/dL 6.9 6.5 6.8  Total Bilirubin  0.3 - 1.2 mg/dL 0.4 0.6 0.4  Alkaline Phos 38 - 126 U/L 51 44 43  AST 15 - 41 U/L 30 30 22   ALT 0 - 44 U/L 21 28 13      Adverse Effects Assessment Neutropenia: patient has thermometer at home.  ANC WNL today. Has been afebrile Pneumonitis: she is fatigued but denies shortness of breath, cough, painful inspirations. Will continue to monitor Cardiotoxicity: discussed that ECHO will be ordered per manufacturer guidelines.  Last was 11/21/20 Decreased appetite: encouraged her to eat foods that she likes.  Enjoys a pepperoni pizza and will increase her meal supplement shakes Pain: has some back pain, controlled with Tramadol  Medication Reconciliation The patient's medication list was reviewed today with the patient? Yes New medications or herbal supplements have recently been started? No  Any  medications have been discontinued? No  The medication list was updated and reconciled based on the patient's most recent medication list in the electronic medical record (EMR) including herbal products and OTC medications.   Medications Current Outpatient Medications  Medication Sig Dispense Refill   Cholecalciferol (VITAMIN D3 ADULT GUMMIES PO) Take 2,000 Units by mouth daily.      Cyanocobalamin (VITAMIN B-12 PO) Take by mouth daily. Takes every other day     dexamethasone (DECADRON) 4 MG tablet Take 2 tablets (8 mg total) by mouth daily. Start the day after chemotherapy for 2 days. (Patient not taking: Reported on 01/31/2021) 30 tablet 1   diazepam (VALIUM) 5 MG tablet Take one tablet by mouth just before MRI; may repeat once 10 tablet 0   Iron-Vitamins (GERITOL PO) Take by mouth daily.     OVER THE COUNTER MEDICATION OTC Apple Cider Vinegar 1 capsule daily.     prochlorperazine (COMPAZINE) 10 MG tablet Take 1 tablet (10 mg total) by mouth every 6 (six) hours as needed (Nausea or vomiting). (Patient not taking: Reported on 01/31/2021) 30 tablet 1   traMADol (ULTRAM) 50 MG tablet Take 1 tablet (50 mg total) by mouth every 12 (twelve) hours as needed. 30 tablet 0   No current facility-administered medications for this visit.    Drug-Drug Interactions (DDIs) DDIs were evaluated? Yes Significant DDIs? No  The patient was instructed to speak with their health care provider and/or the oral chemotherapy pharmacist before starting any new drug, including prescription or over the counter, natural / herbal products, or vitamins.  Supportive Care Will contact clinic if fatigue worsens Continue anti-emetics and contact with any new symptoms  Patient Education/Re-education The patient was educated regarding the following supportive care topics:   Discussed proper administration of dexamethasone  Additional education included: none at this time  Dosing Assessment Hepatic adjustments needed? No   Renal adjustments needed? No  Toxicity adjustments needed? No  The current dosing regimen is appropriate to continue at this time.  Follow-Up Plan Labs, see April Rosales, treatment with Fam-trastuzumab deruxtecan on 03/21/21 Labs see me pharmacy clinic, treatment with Fam-trastuzumab deruxtecan on 04/11/21  April Rosales participated in the discussion, expressed understanding, and voiced agreement with the above plan. All questions were answered to her satisfaction. The patient was advised to contact the clinic at [phone] with any questions or concerns prior to her return visit.   I spent 30 minutes assessing and educating the patient.  [name], RPH-CPP, 02/28/2021  1:14 PM

## 2021-02-28 NOTE — Patient Instructions (Signed)
[facility]  Discharge Instructions: ?Thank you for choosing [facility] to provide your oncology and hematology care.  ? ?If you have a lab appointment with the Cancer Center, please go directly to the Cancer Center and check in at the registration area. ?  ?Wear comfortable clothing and clothing appropriate for easy access to any Portacath or PICC line.  ? ?We strive to give you quality time with your provider. You may need to reschedule your appointment if you arrive late (15 or more minutes).  Arriving late affects you and other patients whose appointments are after yours.  Also, if you miss three or more appointments without notifying the office, you may be dismissed from the clinic at the provider?s discretion.    ?  ?For prescription refill requests, have your pharmacy contact our office and allow 72 hours for refills to be completed.   ? ?Today you received the following chemotherapy and/or immunotherapy agents: Enhertu.    ?  ?To help prevent nausea and vomiting after your treatment, we encourage you to take your nausea medication as directed. ? ?BELOW ARE SYMPTOMS THAT SHOULD BE REPORTED IMMEDIATELY: ?*FEVER GREATER THAN 100.4 F (38 ?C) OR HIGHER ?*CHILLS OR SWEATING ?*NAUSEA AND VOMITING THAT IS NOT CONTROLLED WITH YOUR NAUSEA MEDICATION ?*UNUSUAL SHORTNESS OF BREATH ?*UNUSUAL BRUISING OR BLEEDING ?*URINARY PROBLEMS (pain or burning when urinating, or frequent urination) ?*BOWEL PROBLEMS (unusual diarrhea, constipation, pain near the anus) ?TENDERNESS IN MOUTH AND THROAT WITH OR WITHOUT PRESENCE OF ULCERS (sore throat, sores in mouth, or a toothache) ?UNUSUAL RASH, SWELLING OR PAIN  ?UNUSUAL VAGINAL DISCHARGE OR ITCHING  ? ?Items with * indicate a potential emergency and should be followed up as soon as possible or go to the Emergency Department if any problems should occur. ? ?Please show the CHEMOTHERAPY ALERT CARD or IMMUNOTHERAPY ALERT CARD at check-in to  the Emergency Department and triage nurse. ? ?Should you have questions after your visit or need to cancel or reschedule your appointment, please contact [facility]  Dept: [phone]  and follow the prompts.  Office hours are 8:00 a.m. to 4:30 p.m. Monday - Friday. Please note that voicemails left after 4:00 p.m. may not be returned until the following business day.  We are closed weekends and major holidays. You have access to a nurse at all times for urgent questions. Please call the main number to the clinic Dept: [phone] and follow the prompts. ? ? ?For any non-urgent questions, you may also contact your provider using MyChart. We now offer e-Visits for anyone 18 and older to request care online for non-urgent symptoms. For details visit [email or web address]. ?  ?Also download the MyChart app! Go to the app store, search "MyChart", open the app, select [facility], and log in with your MyChart username and password. ? ?Due to Covid, a mask is required upon entering the hospital/clinic. If you do not have a mask, one will be given to you upon arrival. For doctor visits, patients may have 1 support person aged 18 or older with them. For treatment visits, patients cannot have anyone with them due to current Covid guidelines and our immunocompromised population.  ? ?

## 2021-03-01 ENCOUNTER — Telehealth: Payer: Self-pay | Admitting: Oncology

## 2021-03-01 NOTE — Telephone Encounter (Signed)
Scheduled appointment per 10/06 los. Patient aware.

## 2021-03-05 ENCOUNTER — Other Ambulatory Visit: Payer: Self-pay

## 2021-03-05 ENCOUNTER — Ambulatory Visit (HOSPITAL_COMMUNITY)
Admission: RE | Admit: 2021-03-05 | Discharge: 2021-03-05 | Disposition: A | Discharge status: Home / Self Care | Payer: Medicare Other | Source: Ambulatory Visit | Attending: Oncology | Admitting: Oncology

## 2021-03-05 DIAGNOSIS — Z5111 Encounter for antineoplastic chemotherapy: Secondary | ICD-10-CM

## 2021-03-05 DIAGNOSIS — E785 Hyperlipidemia, unspecified: Secondary | ICD-10-CM | POA: Diagnosis not present

## 2021-03-05 DIAGNOSIS — Z79899 Other long term (current) drug therapy: Secondary | ICD-10-CM | POA: Diagnosis not present

## 2021-03-05 DIAGNOSIS — Z5181 Encounter for therapeutic drug level monitoring: Secondary | ICD-10-CM | POA: Diagnosis not present

## 2021-03-05 DIAGNOSIS — I10 Essential (primary) hypertension: Secondary | ICD-10-CM | POA: Diagnosis not present

## 2021-03-05 DIAGNOSIS — C7951 Secondary malignant neoplasm of bone: Secondary | ICD-10-CM | POA: Diagnosis not present

## 2021-03-05 DIAGNOSIS — Z0189 Encounter for other specified special examinations: Secondary | ICD-10-CM | POA: Diagnosis not present

## 2021-03-05 DIAGNOSIS — Z09 Encounter for follow-up examination after completed treatment for conditions other than malignant neoplasm: Secondary | ICD-10-CM

## 2021-03-05 LAB — ECHOCARDIOGRAM COMPLETE: S' Lateral: 2.1 cm

## 2021-03-18 ENCOUNTER — Other Ambulatory Visit: Payer: Self-pay | Admitting: Oncology

## 2021-03-18 ENCOUNTER — Telehealth: Payer: Self-pay | Admitting: *Deleted

## 2021-03-18 NOTE — Telephone Encounter (Signed)
Pt called to this RN to states " I was reading over the side effects of the medication I am getting and want to let Dr Jana Hakim know :  She states she has dizziness " when I stand up - I have to be careful and go slow "  She states bilateral ankle swelling- on some days worse then others.  She has chills all day and relieved when she " goes to bed and turn on my heating blanket "  Chills do not bother her at night while she is sleeping but just during the day.  She denies any fevers, cough or discomfort with urination.  She states she is " very weak- just all the time "  She is scheduled for visit and next treatment of Enhertu on 03/21/2021.  This RN informed her above would be given to MD for review and further recommendations.

## 2021-03-19 ENCOUNTER — Encounter: Payer: Medicare Other | Admitting: Internal Medicine

## 2021-03-20 MED FILL — Dexamethasone Sodium Phosphate Inj 100 MG/10ML: INTRAMUSCULAR | Qty: 1 | Status: AC

## 2021-03-20 NOTE — Progress Notes (Signed)
April Rosales  [phone] [phone]    ID: April Rosales DOB: April 13, 1941  MR#: [id number]  POI#:[id number]  Patient Care Team: April Pinto, MD as PCP - General (Internal Medicine) April Rosales, April Dad, MD as Consulting Physician (Oncology) April Killings, MD as Consulting Physician (Orthopedic Surgery) April Skates, MD as Consulting Physician (General Surgery) April Dresser, MD as Consulting Physician (Cardiology) April Rosales, April Rosales (Pharmacist) OTHER: April Rosales, DDS   CHIEF COMPLAINT: Stage IV estrogen receptor positive breast cancer  CURRENT TREATMENT: Enhertu; denosumab/Prolia every 6 months   INTERVAL HISTORY: April Rosales returns today for follow up of her metastatic breast cancer.    April Rosales was switched to [facility] on 02/07/2021 after MRI of the abdomen 01/24/2021 showed progression in her liver metastases. Today will be her third dose.  She underwent repeat echocardiogram on 03/05/2021 showing stability with an ejection fraction of 60-65%.    We also received denosumab/Prolia every 6 months for osteoporosis.  She started this on 03/28/2020.  Her most recent dose was 02/28/2021.    REVIEW OF SYSTEMS:  April Rosales did well with her first dose of Enhertu, feeling a bit tired for about a week but then recovering.  After the second dose she still has not recovered.  She feels very tired, has difficulty getting through her activities of daily living, has lost her sense of taste and is having trouble keeping herself well-hydrated.  She likes salty foods now although apparently she can get ice cream down she says well.  Of course she mostly eats TV dinners and does no cooking.  She has lost a little weight.  She has begun to lose a little bit of hair.  She has had no constipation or diarrhea problems she has had no mouth sores and no nausea or vomiting.  A detailed review of systems was otherwise stable.   COVID 19 VACCINATION STATUS:  April Rosales, most recently 11/2019, no boosters as of September 2022   BREAST CANCER HISTORY: From the original intake note:   April Rosales underwent right lumpectomy in 1985 at [facility] for what sounds like a stage I breast cancer. She tells me she had more than 30 lymph nodes removed from her right axilla and all of them were clear. She received  adjuvant radiation but no systemic treatment.  The patient had recently refused mammography with the last mammogram I can find dating back to August 2010.  More recently the patient presented with left scapular pain radiating down the left arm.  She was evaluated by Dr. Lorin Rosales, who obtained a chest x-ray showing a possible abnormality at T3. He then set up the patient for an MRI of the thoracic spine performed 08/16/2014.  This showed multiple compression fractures  (a similar picture had been noted on lumbar MRI 10/09/2011 ). However at T3 they noted tumor in the vertebral body extending into the left pedicle and into the lateral epidural space, displacing the cord to the right. There was no evidence of cord compression or cord signal abnormality. There were no other areas of tumor identified in the thoracic spine.         The patient was then referred to Dr. Hal Rosales who on 08/24/2014 set April Rosales up for CT scans of the chest, abdomen and pelvis. There was a dense mass in the upper inner quadrant of the left breast measuring 1.6 cm. There was a 1.2 cm nodule in the minor fissure of the right lung and some evidence  of right lung fibrosis at the site of the prior radiation port.  There was also a thyroid mass measuring 2 cm. However there were no parenchymal lung or liver lesions. Incidental meningoceles were noted as well as sclerosis of the fifth and sixth ribs which were felt to be likely posttraumatic.   On 08/29/2014 the patient underwent bilateral diagnostic mammography with tomosynthesis and left breast ultrasonography.  There were postsurgical  changes in the upper right breast. In the left breast there was an irregular mass measuring 2.3 cm in the upper inner quadrant. This was palpable. Ultrasound showed this to be hypoechoic and to measure 2.0 cm. There were adjacent areas of nodularity. There was no definite lymphadenopathy in the left axilla.  Biopsy of this breast mass 08/29/2014 showed an invasive adenocarcinoma with both ductal and lobular features (there was strong diffuse E-cadherin expression as well as areas with total absence of E-cadherin expression), with the preliminary prognostic profile showing strong estrogen positivity, very weak to near absent progesterone positivity, an MIB-1 of approximately 40%, and HER-2 equivocal  The patient's subsequent history is as detailed below.   PAST MEDICAL HISTORY: Past Medical History:  Diagnosis Date   Arthritis    Breast cancer ([place]) 1985/2016   takes [name] daily   Chronic back pain    stenosis/listhesis   Family history of ovarian cancer    Osteoporosis    takes Vit D   Radiation 11/23/14-12/11/14   30 Gy T1-T5     PAST SURGICAL HISTORY: Past Surgical History:  Procedure Laterality Date   ABDOMINAL HYSTERECTOMY     1980   APPENDECTOMY  5498   APPLICATION OF INTRAOPERATIVE CT SCAN N/A 10/20/2014   Procedure: APPLICATION OF INTRAOPERATIVE CAT SCAN;  Surgeon: April Chimera, MD;  Location: [facility];  Service: Neurosurgery;  Laterality: N/A;   BREAST LUMPECTOMY WITH RADIOACTIVE SEED LOCALIZATION Left 05/03/2018   Procedure: LEFT BREAST LUMPECTOMY WITH BRACKETED RADIOACTIVE SEED LOCALIZATION;  Surgeon: April Skates, MD;  Location: [facility];  Service: General;  Laterality: Left;   BREAST SURGERY Right 1985   THYROID CYST EXCISION  1967    FAMILY HISTORY Family History  Problem Relation Age of Onset   Heart disease Mother    Hypertension Mother    Heart disease Father    Diabetes Father    Breast cancer Paternal Aunt        4 paternal aunts  with breast cancer over 39   Prostate cancer Paternal Uncle    Stroke Paternal Grandfather    Ovarian cancer Paternal Aunt    Huntington's disease Other        Nephew, inherited from his father  The patient's father died from a heart attack at the age of 58. He had 9 sisters. 3 of those sisters had breast cancer, all in a menopausal setting. Another sister had ovarian cancer. One of the paternal uncles had cancer of the colon "and back".  The patient's mother died at the age of 1. She was found to have breast cancer shortly before dying , during her final hospitalization.   GYNECOLOGIC HISTORY:  No LMP recorded. Patient has had a hysterectomy.  Menarche age 34, first live birth age 66, the patient is GX P1. She underwent hysterectomy in 1980. She thinks the ovaries were removed, but the CT scan obtained 08/24/2014 showed a definite right ovary. The left ovary may have been removed. She did not take hormone replacement after the hysterectomy.   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 She is divorced, lives alone, with 2 cats. Her son April Rosales lives in [place] where he works in Engineer, technical sales. He has 4 children of his own. The patient is a Psychologist, forensic.   ADVANCED DIRECTIVES:  In place. The patient has named her sister April Rosales [phone]) and her friend April Rosales [phone]) as joint healthcare powers of attorney   HEALTH MAINTENANCE: Social History   Tobacco Use   Smoking status: Former    Packs/day: 0.50    Years: 10.00    Pack years: 5.00    Types: Cigarettes    Quit date: 05/03/1970    Years since quitting: 50.9   Smokeless tobacco: Former   Tobacco comments:    quit smoking 1970's  Substance Use Topics   Alcohol use: Yes    Alcohol/week: 0.0 standard drinks    Comment: Rare   Drug use: No     Colonoscopy: never  PAP: status post hysterectomy  Bone density: 09/12/2016 [facility]/ T score of -4.8 osteoporosis  Lipid panel:  Allergies  Allergen  Reactions   Ativan [Lorazepam]     Made her crazy   Dilaudid [Hydromorphone Hcl]     Doesn't want   Haldol [Haloperidol Lactate]     Made her crazy    Current Outpatient Medications  Medication Sig Dispense Refill   Cholecalciferol (VITAMIN D3 ADULT GUMMIES PO) Take 2,000 Units by mouth daily.      Cyanocobalamin (VITAMIN B-12 PO) Take by mouth daily. Takes every other day     dexamethasone (DECADRON) 4 MG tablet Take 2 tablets (8 mg total) by mouth daily. Start the day after chemotherapy for 2 days. (Patient not taking: Reported on 01/31/2021) 30 tablet 1   diazepam (VALIUM) 5 MG tablet Take one tablet by mouth just before MRI; may repeat once 10 tablet 0   Iron-Vitamins (GERITOL PO) Take by mouth daily.     OVER THE COUNTER MEDICATION OTC Apple Cider Vinegar 1 capsule daily.     prochlorperazine (COMPAZINE) 10 MG tablet Take 1 tablet (10 mg total) by mouth every 6 (six) hours as needed (Nausea or vomiting). (Patient not taking: Reported on 01/31/2021) 30 tablet 1   traMADol (ULTRAM) 50 MG tablet Take 1 tablet (50 mg total) by mouth every 12 (twelve) hours as needed. 30 tablet 0   No current facility-administered medications for this visit.    OBJECTIVE: White Rosales who appears stated age  43:   03/21/21 1041  BP: (!) 151/80  Pulse: (!) 101  Resp: 16  Temp: (!) 97.5 F (36.4 C)  SpO2: 96%   Wt Readings from Last 3 Encounters:  03/21/21 111 lb 1.6 oz (50.4 kg)  02/28/21 114 lb 1.6 oz (51.8 kg)  02/07/21 116 lb 1.6 oz (52.7 kg)   Body mass index is 20.99 kg/m.    ECOG FS:1 - Symptomatic but completely ambulatory  Sclerae unicteric, EOMs intact Wearing a mask No cervical or supraclavicular adenopathy Lungs no rales or rhonchi Heart regular rate and rhythm Abd soft, nontender, positive bowel sounds MSK kyphosis but no focal spinal tenderness, no upper extremity lymphedema Neuro: nonfocal, well oriented, appropriate affect Breasts: Deferred   LAB RESULTS:  CMP      Component Value Date/Time   NA 142 03/21/2021 1027   NA 141 05/29/2017 1417   K 3.4 (L) 03/21/2021 1027   K 3.6 05/29/2017 1417   CL 107 03/21/2021 1027   CO2 24 03/21/2021 1027   CO2 27 05/29/2017 1417  GLUCOSE 102 (H) 03/21/2021 1027   GLUCOSE 111 05/29/2017 1417   BUN 9 03/21/2021 1027   BUN 17.0 05/29/2017 1417   CREATININE 0.71 03/21/2021 1027   CREATININE 0.66 03/05/2020 1218   CREATININE 0.8 05/29/2017 1417   CALCIUM 10.2 03/21/2021 1027   CALCIUM 10.7 (H) 06/26/2017 1216   CALCIUM 10.4 05/29/2017 1417   PROT 6.4 (L) 03/21/2021 1027   PROT 7.3 05/29/2017 1417   ALBUMIN 3.2 (L) 03/21/2021 1027   ALBUMIN 3.9 05/29/2017 1417   AST 23 03/21/2021 1027   AST 34 12/02/2019 1147   AST 17 05/29/2017 1417   ALT 17 03/21/2021 1027   ALT 30 12/02/2019 1147   ALT 15 05/29/2017 1417   ALKPHOS 54 03/21/2021 1027   ALKPHOS 42 05/29/2017 1417   BILITOT 0.7 03/21/2021 1027   BILITOT 0.4 12/02/2019 1147   BILITOT 0.30 05/29/2017 1417   GFRNONAA >60 03/21/2021 1027   GFRNONAA 83 03/05/2020 1218   GFRAA 97 03/05/2020 1218    INo results found for: SPEP, UPEP  Lab Results  Component Value Date   WBC 5.0 03/21/2021   NEUTROABS 2.9 03/21/2021   HGB 14.0 03/21/2021   HCT 39.8 03/21/2021   MCV 99.5 03/21/2021   PLT 320 03/21/2021      Chemistry      Component Value Date/Time   NA 142 03/21/2021 1027   NA 141 05/29/2017 1417   K 3.4 (L) 03/21/2021 1027   K 3.6 05/29/2017 1417   CL 107 03/21/2021 1027   CO2 24 03/21/2021 1027   CO2 27 05/29/2017 1417   BUN 9 03/21/2021 1027   BUN 17.0 05/29/2017 1417   CREATININE 0.71 03/21/2021 1027   CREATININE 0.66 03/05/2020 1218   CREATININE 0.8 05/29/2017 1417      Component Value Date/Time   CALCIUM 10.2 03/21/2021 1027   CALCIUM 10.7 (H) 06/26/2017 1216   CALCIUM 10.4 05/29/2017 1417   ALKPHOS 54 03/21/2021 1027   ALKPHOS 42 05/29/2017 1417   AST 23 03/21/2021 1027   AST 34 12/02/2019 1147   AST 17 05/29/2017 1417    ALT 17 03/21/2021 1027   ALT 30 12/02/2019 1147   ALT 15 05/29/2017 1417   BILITOT 0.7 03/21/2021 1027   BILITOT 0.4 12/02/2019 1147   BILITOT 0.30 05/29/2017 1417       Lab Results  Component Value Date   LABCA2 24 01/01/2016    No components found for: [id number]  No results for input(s): INR in the last 168 hours.  Urinalysis    Component Value Date/Time   COLORURINE YELLOW 03/05/2020 [place] 03/05/2020 1218   LABSPEC 1.006 03/05/2020 1218   PHURINE 7.0 03/05/2020 [place] 03/05/2020 [place] 03/05/2020 [place] 09/19/2015 [place] 03/05/2020 [place] 03/05/2020 1218   NITRITE NEGATIVE 03/05/2020 1218   LEUKOCYTESUR 2+ (A) 03/05/2020 1218    STUDIES: ECHOCARDIOGRAM COMPLETE  Result Date: 03/05/2021    ECHOCARDIOGRAM REPORT   Patient Name:   AKIYA MORR Date of Exam: 03/05/2021 Medical Rec #:  [id number]        Height:       61.0 in Accession #:    [id number]       Weight:       114.1 lb Date of Birth:  [birth year]         BSA:  1.488 m Patient Age:    66 years         BP:           151/102 mmHg Patient Gender: F                HR:           113 bpm. Exam Location:  Outpatient Procedure: 2D Echo, Color Doppler, Cardiac Doppler and Strain Analysis Indications:    Chemo Evaluation  History:        Patient has prior history of Echocardiogram examinations, most                 recent 11/21/2020. Risk Factors:Hypertension and Dyslipidemia.  Sonographer:    Raquel Sarna Senior RDCS Referring Phys: [place] Comments: Technically difficult due to spinal limitations, scanned upright. IMPRESSIONS  1. Compared to previous study, LV function remains normal; GLS mildly abnormal.  2. Left ventricular ejection fraction, by estimation, is 60 to 65%. The left ventricle has normal function. The left ventricle has no regional wall motion abnormalities. Left ventricular  diastolic parameters are consistent with Grade I diastolic dysfunction (impaired relaxation). The average left ventricular global longitudinal strain is -16.1 %. The global longitudinal strain is abnormal.  3. Right ventricular systolic function is normal. The right ventricular size is normal.  4. The mitral valve is normal in structure. Trivial mitral valve regurgitation. No evidence of mitral stenosis.  5. The aortic valve is tricuspid. Aortic valve regurgitation is not visualized. No aortic stenosis is present.  6. The inferior vena cava is normal in size with greater than 50% respiratory variability, suggesting right atrial pressure of 3 mmHg. FINDINGS  Left Ventricle: Left ventricular ejection fraction, by estimation, is 60 to 65%. The left ventricle has normal function. The left ventricle has no regional wall motion abnormalities. The average left ventricular global longitudinal strain is -16.1 %. The global longitudinal strain is abnormal. The left ventricular internal cavity size was normal in size. There is no left ventricular hypertrophy. Left ventricular diastolic parameters are consistent with Grade I diastolic dysfunction (impaired relaxation). Right Ventricle: The right ventricular size is normal. Right ventricular systolic function is normal. Left Atrium: Left atrial size was normal in size. Right Atrium: Right atrial size was normal in size. Pericardium: There is no evidence of pericardial effusion. Mitral Valve: The mitral valve is normal in structure. Trivial mitral valve regurgitation. No evidence of mitral valve stenosis. Tricuspid Valve: The tricuspid valve is normal in structure. Tricuspid valve regurgitation is trivial. No evidence of tricuspid stenosis. Aortic Valve: The aortic valve is tricuspid. Aortic valve regurgitation is not visualized. No aortic stenosis is present. Pulmonic Valve: The pulmonic valve was not well visualized. Pulmonic valve regurgitation is trivial. No evidence of  pulmonic stenosis. Aorta: The aortic root is normal in size and structure. Venous: The inferior vena cava is normal in size with greater than 50% respiratory variability, suggesting right atrial pressure of 3 mmHg. IAS/Shunts: The interatrial septum was not well visualized. Additional Comments: Compared to previous study, LV function remains normal; GLS mildly abnormal.  LEFT VENTRICLE PLAX 2D LVIDd:         3.00 cm LVIDs:         2.10 cm   2D Longitudinal Strain LV PW:         0.80 cm   2D Strain GLS Avg:     -16.1 % LV IVS:        0.60 cm LVOT  diam:     1.80 cm LV SV:         33 LV SV Index:   22 LVOT Area:     2.54 cm  RIGHT VENTRICLE RV S prime:     14.80 cm/s TAPSE (M-mode): 1.9 cm LEFT ATRIUM             Index        RIGHT ATRIUM           Index LA diam:        2.40 cm 1.61 cm/m   RA Area:     10.60 cm LA Vol (A2C):   34.7 ml 23.32 ml/m  RA Volume:   19.70 ml  13.24 ml/m LA Vol (A4C):   33.4 ml 22.45 ml/m LA Biplane Vol: 36.8 ml 24.73 ml/m  AORTIC VALVE LVOT Vmax:   75.00 cm/s LVOT Vmean:  54.100 cm/s LVOT VTI:    0.128 m  AORTA Ao Root diam: 2.60 cm Ao Asc diam:  2.70 cm  SHUNTS Systemic VTI:  0.13 m Systemic Diam: 1.80 cm Kirk Ruths MD Electronically signed by Kirk Ruths MD Signature Date/Time: 03/05/2021/10:55:37 AM    Final       ASSESSMENT: 80 y.o. April Rosales with stage IV left breast cancer involving bone  (1) status post right lumpectomy and axillary node dissection in 1985 followed by radiation at [facility] 08/16/2014: measurable disease in spine, lung and left breast   (2) evaluation for left shoulder pain led to thoracic spine MRI 08/16/2014 showing a pathologic fracture at T3 with epidural tumor displacing the cord to the right, but no cord compression. CT scans of the chest, abdomen and pelvis 08/24/2014 showed in addition a mass in the upper outer quadrant left breast measuring 1.6 cm and a nodule in the minor fissure of the right lung measuring 1.2 cm,  but no parenchymal lung or liver lesions.   (a) CA 27-29 was noninformative at 38 (09/20/2014)    (3) mammography and ultrasonography 08/29/2014 show a mass in the upper inner left breast which was palpable,  measuring 2.0 cm by ultrasound. Biopsy of this mass 08/29/2014 showed an invasive breast cancer with both lobular and ductal features, estrogen receptor positive, progesterone receptor weakly positive, with an MIB-1 in the 40% range, HER-2 equivocal (6 else ratio 1.5, but average number her nucleus 5.8)   (4) letrozole started 08/31/2014;   (a) palbociclib added Sept 2016 at 75 mg 21/7, with significant neutropenia; not repeated after first cycle  (b) letrozole discontinued 05/29/2017 with evidence of disease progression in the breast   (5)  zolendronate started 09/20/2014, stopped after initial dose due to poor tolerance  (a) denosumab/ Xgeva started 01/31/2015, repeated every 4 weeks  (b) changed to every 8 weeks as of August 2018.   (c) every 12 weeks recommended and ordered to start 01/2020   (6) on 10/20/2014 the patient underwent T2-T3 and T4 decompressive laminectomy with removal of epidural tumor, C7-T4 segmental pedicle screw instrumentation with virage screw system with arrow guidance protocol and C7-T4 posterolateral fusion. The cells were positive for the estrogen receptor. HER-2/neu testing by [facility] showed again equivocal results,  (a) most recent bone scan 10/01/2016 finds no new lesions only postoperative changes  (7) radiation 11/23/2014-12/11/2014.  (a) T1-T5 was treated to 30 Gy in 12 fractions at 2.5 Gy per fraction   (8) initiated close follow-up while considering eventual left lumpectomy or mastectomy depending on  longer-term results of systemic therapy  (  a) most recent left breast ultrasonography 10/30/2016 found this mass to measure 0.7 cm  (b) repeat left breast ultrasonography 05/13/2017 showed the upper outer quadrant mass to have grown to 1.4 cm  (c) left breast  ultrasound 09/08/2017 shows the mass to now measure 0.9 cm  (d) left breast biopsy Rosales on 12/11/2017 shows high-grade ductal carcinoma in situ, essentially estrogen receptor negative  (9) genetics testing using the Breast/Ovarian Cancer Panel through [facility] [name], MD) found no deleterious mutations in ATM, BARD1, BRCA1, BRCA2, BRIP1, CDH1, CHEK2, EPCAM, FANCC, MLH1, MSH2, MSH6, NBN, PALB2, PMS2, PTEN, RAD51C, RAD51D, STK11, TP53, or XRCC2    (10) right thyroid nodule is a complex cyst as noted on CT scan of the neck 01/29/2016  (11) osteoporosis: [facility] density at [facility] 09/12/2016 showed a T score of -4.8.  (a) switched to Prolia starting 03/29/2020  (12) fulvestrant started 05/29/2017, last dose 07/22/2018 (discontinued due to pandemic).  (a) started palbociclib 06/29/2017 at 75 mg every other day for 21 days on, 7 days off  (b) palbociclib discontinued on 3/29 due to progressive fatigue and patient preference  (13)  was to start abemaciclib 02/04/2018, but patient opted for going back to palbociclib at a lower dose beginning in November 19, patient subsequently declined s palbociclib, and  proceeded to surgery  (14) status post left lumpectomy 05/03/2018 showing a pT1b pNX invasive ductal carcinoma, grade 2, with equivocal HER-2 results  (a) opted against adjuvant radiation given presence of stage IV disease  (15) tamoxifen supposed to have been started started 09/13/2018 as a "bridge" pending resumption of fulvestrant/denosumab, but never started by the patient  (16) PET scan 11/02/2018 showed no active disease  (a) [place] scan on 10/31/2019 shows activity in 2 liver lesions, no active bone or lung lesions  (b) abdominal ultrasound 11/08/2019 confirms 2 liver lesions measuring 4.4 and 2.1 cm.  (c) biopsy of 1 of the liver lesions 12/12/2019 confirmed metastatic breast cancer, estrogen and progesterone receptor positive, with an MIB-1 of 10%, and HER-2 positive, with a copy number of  2.12 and the number per cell 6.25  (d) abdominal ultrasound 03/12/2020 shows one of the liver lesions to have increased, while the second lesion has decreased  (17) letrozole started 02/15/2019, discontinued 12/02/2019 with evidence of progression  (a) bone density 09/12/2016 showed a T score of -4.8.  (18) fulvestrant resumed 12/08/2019  (a) palbociclib added at 75 mg daily 21/7 starting 12/20/2019   (b) fulvestrant and palbociclib discontinued September 2022 with evidence of progression.  (19) trastuzumab started 04/25/2020, repeat every 28 days  (a) echo 03/12/2020 shows an ejection fraction in the 60-65% range  (b) changed to subQ formulation of trastuzumab 06/20/2020  (c) trastuzumab discontinued September 2022 with MRI of the abdomen showing evidence of progression  (20) starting Enhertu on 02/07/2021  (A) echo 11/21/2020 showed an ejection fraction in the 60-65% range  (B) third Enhertu dose delayed a week to allow for symptom recovery   PLAN: April Rosales is 6-1/2 years out from definitive diagnosis of metastatic breast cancer.  She was switched to [facility] in September after abdominal MRI showed evidence of progression in the liver.  I do not think we can proceed to treatment today and even though her counts are excellent.  She is just too fatigued and overwhelmed.  We are moving treatment back 1week.  Her next treatment, the fourth 1, with then be on November 25 but I will check with her on November 23 and if she is feeling  as fatigued at that time as she did today we will move that treatment back in 1 week.  After she completes 4 cycles of Enhertu assuming we can get that far, she will be restaged  She knows to call for any other issue that may develop before the next visit.  Total encounter time 25 minutes.Sarajane Jews C. Kailey Esquilin, MD 03/21/21 6:00 PM Medical Oncology and Hematology [facility] [place], [place] [address] Tel. [phone]    Fax.  [phone]   I, Wilburn Mylar, am acting as scribe for Dr. Virgie Rosales. Shoaib Siefker.  I, Lurline Del MD, have reviewed the above documentation for accuracy and completeness, and I agree with the above.   *Total Encounter Time as defined by the Centers for Medicare and Medicaid Services includes, in addition to the face-to-face time of a patient visit (documented in the note above) non-face-to-face time: obtaining and reviewing outside history, ordering and reviewing medications, tests or procedures, care coordination (communications with other health care professionals or caregivers) and documentation in the medical record.

## 2021-03-21 ENCOUNTER — Other Ambulatory Visit: Payer: Self-pay

## 2021-03-21 ENCOUNTER — Inpatient Hospital Stay: Payer: Medicare Other

## 2021-03-21 ENCOUNTER — Inpatient Hospital Stay (HOSPITAL_BASED_OUTPATIENT_CLINIC_OR_DEPARTMENT_OTHER): Payer: Medicare Other | Admitting: Oncology

## 2021-03-21 ENCOUNTER — Other Ambulatory Visit: Payer: Medicare Other

## 2021-03-21 ENCOUNTER — Ambulatory Visit: Payer: Medicare Other

## 2021-03-21 ENCOUNTER — Ambulatory Visit: Payer: Medicare Other | Admitting: Oncology

## 2021-03-21 VITALS — BP 151/80 | HR 101 | Temp 97.5°F | Resp 16 | Ht 61.0 in | Wt 111.1 lb

## 2021-03-21 DIAGNOSIS — Z170 Estrogen receptor positive status [ER+]: Secondary | ICD-10-CM

## 2021-03-21 DIAGNOSIS — C50412 Malignant neoplasm of upper-outer quadrant of left female breast: Secondary | ICD-10-CM

## 2021-03-21 DIAGNOSIS — Z7189 Other specified counseling: Secondary | ICD-10-CM | POA: Diagnosis not present

## 2021-03-21 DIAGNOSIS — C50919 Malignant neoplasm of unspecified site of unspecified female breast: Secondary | ICD-10-CM

## 2021-03-21 DIAGNOSIS — Z5112 Encounter for antineoplastic immunotherapy: Secondary | ICD-10-CM | POA: Diagnosis not present

## 2021-03-21 DIAGNOSIS — C7951 Secondary malignant neoplasm of bone: Secondary | ICD-10-CM | POA: Diagnosis not present

## 2021-03-21 DIAGNOSIS — C50912 Malignant neoplasm of unspecified site of left female breast: Secondary | ICD-10-CM | POA: Diagnosis not present

## 2021-03-21 LAB — CBC WITH DIFFERENTIAL/PLATELET
Abs Immature Granulocytes: 0.02 10*3/uL (ref 0.00–0.07)
Basophils Absolute: 0.1 10*3/uL (ref 0.0–0.1)
Basophils Relative: 1 %
Eosinophils Absolute: 0.4 10*3/uL (ref 0.0–0.5)
Eosinophils Relative: 8 %
HCT: 39.8 % (ref 36.0–46.0)
Hemoglobin: 14 g/dL (ref 12.0–15.0)
Immature Granulocytes: 0 %
Lymphocytes Relative: 15 %
Lymphs Abs: 0.8 10*3/uL (ref 0.7–4.0)
MCH: 35 pg — ABNORMAL HIGH (ref 26.0–34.0)
MCHC: 35.2 g/dL (ref 30.0–36.0)
MCV: 99.5 fL (ref 80.0–100.0)
Monocytes Absolute: 0.9 10*3/uL (ref 0.1–1.0)
Monocytes Relative: 18 %
Neutro Abs: 2.9 10*3/uL (ref 1.7–7.7)
Neutrophils Relative %: 58 %
Platelets: 320 10*3/uL (ref 150–400)
RBC: 4 MIL/uL (ref 3.87–5.11)
RDW: 14.2 % (ref 11.5–15.5)
WBC: 5 10*3/uL (ref 4.0–10.5)
nRBC: 0 % (ref 0.0–0.2)

## 2021-03-21 LAB — COMPREHENSIVE METABOLIC PANEL
ALT: 17 U/L (ref 0–44)
AST: 23 U/L (ref 15–41)
Albumin: 3.2 g/dL — ABNORMAL LOW (ref 3.5–5.0)
Alkaline Phosphatase: 54 U/L (ref 38–126)
Anion gap: 11 (ref 5–15)
BUN: 9 mg/dL (ref 8–23)
CO2: 24 mmol/L (ref 22–32)
Calcium: 10.2 mg/dL (ref 8.9–10.3)
Chloride: 107 mmol/L (ref 98–111)
Creatinine, Ser: 0.71 mg/dL (ref 0.44–1.00)
GFR, Estimated: >60 mL/min (ref >60)
Glucose, Bld: 102 mg/dL — ABNORMAL HIGH (ref 70–99)
Potassium: 3.4 mmol/L — ABNORMAL LOW (ref 3.5–5.1)
Sodium: 142 mmol/L (ref 135–145)
Total Bilirubin: 0.7 mg/dL (ref 0.3–1.2)
Total Protein: 6.4 g/dL — ABNORMAL LOW (ref 6.5–8.1)

## 2021-03-27 MED FILL — Dexamethasone Sodium Phosphate Inj 100 MG/10ML: INTRAMUSCULAR | Qty: 1 | Status: AC

## 2021-03-28 ENCOUNTER — Inpatient Hospital Stay: Payer: Medicare Other | Attending: Oncology

## 2021-03-28 ENCOUNTER — Other Ambulatory Visit: Payer: Self-pay

## 2021-03-28 ENCOUNTER — Inpatient Hospital Stay: Payer: Medicare Other

## 2021-03-28 VITALS — BP 139/80 | HR 99 | Temp 98.3°F | Resp 17 | Wt 111.5 lb

## 2021-03-28 DIAGNOSIS — Z23 Encounter for immunization: Secondary | ICD-10-CM | POA: Diagnosis not present

## 2021-03-28 DIAGNOSIS — Z170 Estrogen receptor positive status [ER+]: Secondary | ICD-10-CM

## 2021-03-28 DIAGNOSIS — C50912 Malignant neoplasm of unspecified site of left female breast: Secondary | ICD-10-CM | POA: Insufficient documentation

## 2021-03-28 DIAGNOSIS — Z5112 Encounter for antineoplastic immunotherapy: Secondary | ICD-10-CM | POA: Diagnosis not present

## 2021-03-28 DIAGNOSIS — C787 Secondary malignant neoplasm of liver and intrahepatic bile duct: Secondary | ICD-10-CM | POA: Diagnosis not present

## 2021-03-28 DIAGNOSIS — C7951 Secondary malignant neoplasm of bone: Secondary | ICD-10-CM | POA: Diagnosis not present

## 2021-03-28 DIAGNOSIS — C50412 Malignant neoplasm of upper-outer quadrant of left female breast: Secondary | ICD-10-CM

## 2021-03-28 DIAGNOSIS — C50919 Malignant neoplasm of unspecified site of unspecified female breast: Secondary | ICD-10-CM

## 2021-03-28 LAB — COMPREHENSIVE METABOLIC PANEL
ALT: 15 U/L (ref 0–44)
AST: 26 U/L (ref 15–41)
Albumin: 3.2 g/dL — ABNORMAL LOW (ref 3.5–5.0)
Alkaline Phosphatase: 50 U/L (ref 38–126)
Anion gap: 11 (ref 5–15)
BUN: 6 mg/dL — ABNORMAL LOW (ref 8–23)
CO2: 25 mmol/L (ref 22–32)
Calcium: 9.6 mg/dL (ref 8.9–10.3)
Chloride: 105 mmol/L (ref 98–111)
Creatinine, Ser: 0.64 mg/dL (ref 0.44–1.00)
GFR, Estimated: >60 mL/min (ref >60)
Glucose, Bld: 105 mg/dL — ABNORMAL HIGH (ref 70–99)
Potassium: 3.8 mmol/L (ref 3.5–5.1)
Sodium: 141 mmol/L (ref 135–145)
Total Bilirubin: 0.5 mg/dL (ref 0.3–1.2)
Total Protein: 6.3 g/dL — ABNORMAL LOW (ref 6.5–8.1)

## 2021-03-28 LAB — CBC WITH DIFFERENTIAL/PLATELET
Abs Immature Granulocytes: 0.01 10*3/uL (ref 0.00–0.07)
Basophils Absolute: 0.1 10*3/uL (ref 0.0–0.1)
Basophils Relative: 1 %
Eosinophils Absolute: 0.4 10*3/uL (ref 0.0–0.5)
Eosinophils Relative: 7 %
HCT: 39.4 % (ref 36.0–46.0)
Hemoglobin: 13.8 g/dL (ref 12.0–15.0)
Immature Granulocytes: 0 %
Lymphocytes Relative: 17 %
Lymphs Abs: 1 10*3/uL (ref 0.7–4.0)
MCH: 34.7 pg — ABNORMAL HIGH (ref 26.0–34.0)
MCHC: 35 g/dL (ref 30.0–36.0)
MCV: 99 fL (ref 80.0–100.0)
Monocytes Absolute: 0.6 10*3/uL (ref 0.1–1.0)
Monocytes Relative: 11 %
Neutro Abs: 3.6 10*3/uL (ref 1.7–7.7)
Neutrophils Relative %: 64 %
Platelets: 263 10*3/uL (ref 150–400)
RBC: 3.98 MIL/uL (ref 3.87–5.11)
RDW: 13.9 % (ref 11.5–15.5)
WBC: 5.6 10*3/uL (ref 4.0–10.5)
nRBC: 0 % (ref 0.0–0.2)

## 2021-03-28 MED ORDER — SODIUM CHLORIDE 0.9 % IV SOLN
10.0000 mg | Freq: Once | INTRAVENOUS | Status: AC
  Administered 2021-03-28: 10 mg via INTRAVENOUS
  Filled 2021-03-28: qty 1
  Filled 2021-03-28: qty 10

## 2021-03-28 MED ORDER — INFLUENZA VAC A&B SA ADJ QUAD 0.5 ML IM PRSY
0.5000 mL | PREFILLED_SYRINGE | Freq: Once | INTRAMUSCULAR | Status: AC
  Administered 2021-03-28: 0.5 mL via INTRAMUSCULAR
  Filled 2021-03-28: qty 0.5

## 2021-03-28 MED ORDER — ACETAMINOPHEN 325 MG PO TABS
650.0000 mg | ORAL_TABLET | Freq: Once | ORAL | Status: AC
  Administered 2021-03-28: 650 mg via ORAL
  Filled 2021-03-28: qty 2

## 2021-03-28 MED ORDER — FAM-TRASTUZUMAB DERUXTECAN-NXKI CHEMO 100 MG IV SOLR
5.7000 mg/kg | Freq: Once | INTRAVENOUS | Status: AC
  Administered 2021-03-28: 300 mg via INTRAVENOUS
  Filled 2021-03-28: qty 15

## 2021-03-28 MED ORDER — DEXTROSE 5 % IV SOLN: Freq: Once | INTRAVENOUS | Status: AC

## 2021-03-28 MED ORDER — PALONOSETRON HCL INJECTION 0.25 MG/5ML
0.2500 mg | Freq: Once | INTRAVENOUS | Status: AC
  Administered 2021-03-28: 0.25 mg via INTRAVENOUS
  Filled 2021-03-28: qty 5

## 2021-03-28 MED ORDER — DIPHENHYDRAMINE HCL 25 MG PO CAPS
25.0000 mg | ORAL_CAPSULE | Freq: Once | ORAL | Status: AC
  Administered 2021-03-28: 25 mg via ORAL
  Filled 2021-03-28: qty 1

## 2021-03-28 NOTE — Patient Instructions (Signed)
[facility]  Discharge Instructions: Thank you for choosing [facility] to provide your oncology and hematology care.   If you have a lab appointment with the [facility], please go directly to the [facility] and check in at the registration area.   Wear comfortable clothing and clothing appropriate for easy access to any Portacath or PICC line.   We strive to give you quality time with your provider. You may need to reschedule your appointment if you arrive late (15 or more minutes).  Arriving late affects you and other patients whose appointments are after yours.  Also, if you miss three or more appointments without notifying the office, you may be dismissed from the clinic at the provider's discretion.      For prescription refill requests, have your pharmacy contact our office and allow 72 hours for refills to be completed.    Today you received the following chemotherapy and/or immunotherapy agents: [email or web address]      To help prevent nausea and vomiting after your treatment, we encourage you to take your nausea medication as directed.  BELOW ARE SYMPTOMS THAT SHOULD BE REPORTED IMMEDIATELY: *FEVER GREATER THAN 100.4 F (38 C) OR HIGHER *CHILLS OR SWEATING *NAUSEA AND VOMITING THAT IS NOT CONTROLLED WITH YOUR NAUSEA MEDICATION *UNUSUAL SHORTNESS OF BREATH *UNUSUAL BRUISING OR BLEEDING *URINARY PROBLEMS (pain or burning when urinating, or frequent urination) *BOWEL PROBLEMS (unusual diarrhea, constipation, pain near the anus) TENDERNESS IN MOUTH AND THROAT WITH OR WITHOUT PRESENCE OF ULCERS (sore throat, sores in mouth, or a toothache) UNUSUAL RASH, SWELLING OR PAIN  UNUSUAL VAGINAL DISCHARGE OR ITCHING   Items with * indicate a potential emergency and should be followed up as soon as possible or go to the Emergency Department if any problems should occur.  Please show the CHEMOTHERAPY ALERT CARD or IMMUNOTHERAPY ALERT CARD at check-in to  the Emergency Department and triage nurse.  Should you have questions after your visit or need to cancel or reschedule your appointment, please contact [facility]  Dept: [phone]  and follow the prompts.  Office hours are 8:00 a.m. to 4:30 p.m. Monday - Friday. Please note that voicemails left after 4:00 p.m. may not be returned until the following business day.  We are closed weekends and major holidays. You have access to a nurse at all times for urgent questions. Please call the main number to the clinic Dept: [phone] and follow the prompts.   For any non-urgent questions, you may also contact your provider using MyChart. We now offer e-Visits for anyone 49 and older to request care online for non-urgent symptoms. For details visit [email or web address].   Also download the MyChart app! Go to the app store, search "MyChart", open the app, select [facility], and log in with your MyChart username and password.  Due to Covid, a mask is required upon entering the hospital/clinic. If you do not have a mask, one will be given to you upon arrival. For doctor visits, patients may have 1 support person aged 39 or older with them. For treatment visits, patients cannot have anyone with them due to current Covid guidelines and our immunocompromised population.   Influenza (Flu) Vaccine (Inactivated or Recombinant): What You Need to Know 1. Why get vaccinated? Influenza vaccine can prevent influenza (flu). Flu is a contagious disease that spreads around the [place] every year, usually between October and May. Anyone can get the flu, but it is more dangerous  for some people. Infants and young children, people 76 years and older, pregnant people, and people with certain health conditions or a weakened immune system are at greatest risk of flu complications. Pneumonia, bronchitis, sinus infections, and ear infections are examples of flu-related complications. If you  have a medical condition, such as heart disease, cancer, or diabetes, flu can make it worse. Flu can cause fever and chills, sore throat, muscle aches, fatigue, cough, headache, and runny or stuffy nose. Some people may have vomiting and diarrhea, though this is more common in children than adults. In an average year, thousands of people in the [place] die from flu, and many more are hospitalized. Flu vaccine prevents millions of illnesses and flu-related visits to the doctor each year. 2. Influenza vaccines CDC recommends everyone 6 months and older get vaccinated every flu season. Children 6 months through 51 years of age may need 2 doses during a single flu season. Everyone else needs only 1 dose each flu season. It takes about 2 weeks for protection to develop after vaccination. There are many flu viruses, and they are always changing. Each year a new flu vaccine is made to protect against the influenza viruses believed to be likely to cause disease in the upcoming flu season. Even when the vaccine doesn't exactly match these viruses, it may still provide some protection. Influenza vaccine does not cause flu. Influenza vaccine may be given at the same time as other vaccines. 3. Talk with your health care provider Tell your vaccination provider if the person getting the vaccine: Has had an allergic reaction after a previous dose of influenza vaccine, or has any severe, life-threatening allergies Has ever had Guillain-Barr Syndrome (also called "GBS") In some cases, your health care provider may decide to postpone influenza vaccination until a future visit. Influenza vaccine can be administered at any time during pregnancy. People who are or will be pregnant during influenza season should receive inactivated influenza vaccine. People with minor illnesses, such as a cold, may be vaccinated. People who are moderately or severely ill should usually wait until they recover before getting influenza  vaccine. Your health care provider can give you more information. 4. Risks of a vaccine reaction Soreness, redness, and swelling where the shot is given, fever, muscle aches, and headache can happen after influenza vaccination. There may be a very small increased risk of Guillain-Barr Syndrome (GBS) after inactivated influenza vaccine (the flu shot). Young children who get the flu shot along with pneumococcal vaccine (PCV13) and/or DTaP vaccine at the same time might be slightly more likely to have a seizure caused by fever. Tell your health care provider if a child who is getting flu vaccine has ever had a seizure. People sometimes faint after medical procedures, including vaccination. Tell your provider if you feel dizzy or have vision changes or ringing in the ears. As with any medicine, there is a very remote chance of a vaccine causing a severe allergic reaction, other serious injury, or death. 5. What if there is a serious problem? An allergic reaction could occur after the vaccinated person leaves the clinic. If you see signs of a severe allergic reaction (hives, swelling of the face and throat, difficulty breathing, a fast heartbeat, dizziness, or weakness), call 9-1-1 and get the person to the nearest hospital. For other signs that concern you, call your health care provider. Adverse reactions should be reported to the Vaccine Adverse Event Reporting System (VAERS). Your health care provider will usually file this  report, or you can do it yourself. Visit the VAERS website at www.vaers.SamedayNews.es or call 301-286-7763. VAERS is only for reporting reactions, and VAERS staff members do not give medical advice. 6. The National Vaccine Injury Compensation Program The [employer] Vaccine Injury Compensation Program (VICP) is a federal program that was created to compensate people who may have been injured by certain vaccines. Claims regarding alleged injury or death due to vaccination have a time limit  for filing, which may be as short as two years. Visit the VICP website at GoldCloset.com.ee or call 907-515-3978 to learn about the program and about filing a claim. 7. How can I learn more? Ask your health care provider. Call your local or state health department. Visit the website of the Food and Drug Administration (FDA) for vaccine package inserts and additional information at TraderRating.uy. Contact the Centers for Disease Control and Prevention (CDC): Call 628-337-6972 (1-800-CDC-INFO) or Visit CDC's website at https://gibson.com/. Vaccine Information Statement Inactivated Influenza Vaccine (12/30/2019) This information is not intended to replace advice given to you by your health care provider. Make sure you discuss any questions you have with your health care provider. Document Revised: 02/16/2020 Document Reviewed: 02/16/2020 Elsevier Patient Education  2022 [employer].

## 2021-04-01 ENCOUNTER — Other Ambulatory Visit (HOSPITAL_COMMUNITY): Payer: Self-pay

## 2021-04-01 ENCOUNTER — Telehealth: Payer: Self-pay

## 2021-04-01 ENCOUNTER — Encounter: Payer: Self-pay | Admitting: Oncology

## 2021-04-01 NOTE — Telephone Encounter (Signed)
Oral Oncology Patient Advocate Encounter   Was successful in securing patient an $16 grant from [facility]) to provide copayment coverage for Ibrance.  This will keep the out of pocket expense at $0.     I have spoken with the patient.    The billing information is as follows and has been shared with [facility].   Member ID: [id number] Group ID: [id number] RxBin: [id number] Dates of Eligibility: 01/01/21 through 12/31/21  Fund:  [facility] Phone [phone] Fax [phone] 04/01/2021 4:39 PM

## 2021-04-05 ENCOUNTER — Telehealth: Payer: Self-pay

## 2021-04-05 NOTE — Telephone Encounter (Signed)
Pt called and states she has had difficulty sleeping the past 2 nights and asks if it is OK for her to take Benadryl. Advised pt it is OK for her to take Benadryl or Melatonin. Pt verbalized thanks and understanding.

## 2021-04-11 ENCOUNTER — Other Ambulatory Visit: Payer: Medicare Other

## 2021-04-11 ENCOUNTER — Ambulatory Visit: Payer: Medicare Other | Admitting: Pharmacist

## 2021-04-11 ENCOUNTER — Ambulatory Visit: Payer: Medicare Other

## 2021-04-17 ENCOUNTER — Inpatient Hospital Stay (HOSPITAL_BASED_OUTPATIENT_CLINIC_OR_DEPARTMENT_OTHER): Payer: Medicare Other | Admitting: Oncology

## 2021-04-17 DIAGNOSIS — C7951 Secondary malignant neoplasm of bone: Secondary | ICD-10-CM | POA: Diagnosis not present

## 2021-04-17 DIAGNOSIS — C50919 Malignant neoplasm of unspecified site of unspecified female breast: Secondary | ICD-10-CM | POA: Diagnosis not present

## 2021-04-17 DIAGNOSIS — C50412 Malignant neoplasm of upper-outer quadrant of left female breast: Secondary | ICD-10-CM | POA: Diagnosis not present

## 2021-04-17 DIAGNOSIS — Z170 Estrogen receptor positive status [ER+]: Secondary | ICD-10-CM

## 2021-04-17 MED FILL — Dexamethasone Sodium Phosphate Inj 100 MG/10ML: INTRAMUSCULAR | Qty: 1 | Status: AC

## 2021-04-17 NOTE — Progress Notes (Signed)
[place]  [phone] [phone]    ID: April Rosales DOB: 12-Oct-1940  MR#: [id number]  JYN#:[id number]  Patient Care Team: [name], MD as PCP - General (Internal Medicine) Yiannis Tulloch, [name], MD as Consulting Physician (Oncology) [name], MD as Consulting Physician (Orthopedic Surgery) [name], MD as Consulting Physician (General Surgery) [name], MD as Consulting Physician (Cardiology) [name], RPH-CPP (Pharmacist) OTHER: [name], DDS  I connected with April Rosales on 04/17/21 at 11:00 AM EST by telephone visit and verified that I am speaking with the correct person using two identifiers.   I discussed the limitations, risks, security and privacy concerns of performing an evaluation and management service by telemedicine and the availability of in-person appointments. I also discussed with the patient that there may be a patient responsible charge related to this service. The patient expressed understanding and agreed to proceed.   Other persons participating in the visit and their role in the encounter: None  Patient's location: Home Provider's location: [facility]   I provided 20 minutes of non face-to-face telephone visit time during this encounter, and > 50% was spent counseling as documented under my assessment & plan.   CHIEF COMPLAINT: Stage IV estrogen receptor positive breast cancer  CURRENT TREATMENT: Enhertu; denosumab/Prolia every 6 months   INTERVAL HISTORY: April Rosales was contacted today for follow up of her metastatic breast cancer.    April Rosales was switched to [facility] on 02/07/2021 after MRI of the abdomen 01/24/2021 showed progression in her liver metastases.  She received her third dose 03/28/2021.  She is due for repeat 04/19/2021.  She underwent repeat echocardiogram on 03/05/2021 showing stability with an ejection fraction of 60-65%.    We also received  denosumab/Prolia every 6 months for osteoporosis.  She started this on 03/28/2020.  Her most recent dose was 02/28/2021.     REVIEW OF SYSTEMS:  April Rosales tells me she is not doing well.  She has no appetite.  She is only eating salty stuff.  She is getting a little bit of ankle swelling, likely as a result.  She feels weak and has chills.  She is losing her hair "in bunches".  She does not think she can be treated in 2 days as planned.   COVID 19 VACCINATION STATUS: [place] x2, most recently 11/2019, no boosters as of September 2022   BREAST CANCER HISTORY: From the original intake note:   April Rosales underwent right lumpectomy in 1985 at [facility] for what sounds like a stage I breast cancer. She tells me she had more than 30 lymph nodes removed from her right axilla and all of them were clear. She received  adjuvant radiation but no systemic treatment.  The patient had recently refused mammography with the last mammogram I can find dating back to August 2010.  More recently the patient presented with left scapular pain radiating down the left arm.  She was evaluated by Dr. [name], who obtained a chest x-ray showing a possible abnormality at T3. He then set up the patient for an MRI of the thoracic spine performed 08/16/2014.  This showed multiple compression fractures  (a similar picture had been noted on lumbar MRI 10/09/2011 ). However at T3 they noted tumor in the vertebral body extending into the left pedicle and into the lateral epidural space, displacing the cord to the right. There was no evidence of cord compression or cord signal abnormality. There were no  other areas of tumor identified in the thoracic spine.         The patient was then referred to Dr. Hal Neer who on 08/24/2014 set April Rosales up for CT scans of the chest, abdomen and pelvis. There was a dense mass in the upper inner quadrant of the left breast measuring 1.6 cm. There was a 1.2 cm nodule in the minor fissure of the  right lung and some evidence of right lung fibrosis at the site of the prior radiation port.  There was also a thyroid mass measuring 2 cm. However there were no parenchymal lung or liver lesions. Incidental meningoceles were noted as well as sclerosis of the fifth and sixth ribs which were felt to be likely posttraumatic.   On 08/29/2014 the patient underwent bilateral diagnostic mammography with tomosynthesis and left breast ultrasonography.  There were postsurgical changes in the upper right breast. In the left breast there was an irregular mass measuring 2.3 cm in the upper inner quadrant. This was palpable. Ultrasound showed this to be hypoechoic and to measure 2.0 cm. There were adjacent areas of nodularity. There was no definite lymphadenopathy in the left axilla.  Biopsy of this breast mass 08/29/2014 showed an invasive adenocarcinoma with both ductal and lobular features (there was strong diffuse E-cadherin expression as well as areas with total absence of E-cadherin expression), with the preliminary prognostic profile showing strong estrogen positivity, very weak to near absent progesterone positivity, an MIB-1 of approximately 40%, and HER-2 equivocal  The patient's subsequent history is as detailed below.   PAST MEDICAL HISTORY: Past Medical History:  Diagnosis Date   Arthritis    Breast cancer ([place]) 1985/2016   takes [name] daily   Chronic back pain    stenosis/listhesis   Family history of ovarian cancer    Osteoporosis    takes Vit D   Radiation 11/23/14-12/11/14   30 Gy T1-T5     PAST SURGICAL HISTORY: Past Surgical History:  Procedure Laterality Date   ABDOMINAL HYSTERECTOMY     1980   APPENDECTOMY  0932   APPLICATION OF INTRAOPERATIVE CT SCAN N/A 10/20/2014   Procedure: APPLICATION OF INTRAOPERATIVE CAT SCAN;  Surgeon: [name], MD;  Location: [place] [facility];  Service: Neurosurgery;  Laterality: N/A;   BREAST LUMPECTOMY WITH RADIOACTIVE SEED LOCALIZATION Left  05/03/2018   Procedure: LEFT BREAST LUMPECTOMY WITH BRACKETED RADIOACTIVE SEED LOCALIZATION;  Surgeon: [name], MD;  Location: [place];  Service: General;  Laterality: Left;   BREAST SURGERY Right 1985   THYROID CYST EXCISION  1967    FAMILY HISTORY Family History  Problem Relation Age of Onset   Heart disease Mother    Hypertension Mother    Heart disease Father    Diabetes Father    Breast cancer Paternal Aunt        4 paternal aunts with breast cancer over 28   Prostate cancer Paternal Uncle    Stroke Paternal Grandfather    Ovarian cancer Paternal Aunt    Huntington's disease Other        Nephew, inherited from his father  The patient's father died from a heart attack at the age of 43. He had 9 sisters. 3 of those sisters had breast cancer, all in a menopausal setting. Another sister had ovarian cancer. One of the paternal uncles had cancer of the colon "and back".  The patient's mother died at the age of 71. She was found to have breast cancer shortly before dying ,  during her final hospitalization.   GYNECOLOGIC HISTORY:  No LMP recorded. Patient has had a hysterectomy.  Menarche age 8, first live birth age 59, the patient is GX P1. She underwent hysterectomy in 1980. She thinks the ovaries were removed, but the CT scan obtained 08/24/2014 showed a definite right ovary. The left ovary may have been removed. She did not take hormone replacement after the hysterectomy.   SOCIAL HISTORY:   Sheletha worked in Psychiatric nurse. She is divorced, lives alone, with 2 cats. Her son Bethann Berkshire lives in [place] where he works in Engineer, technical sales. He has 4 children of his own. The patient is a Psychologist, forensic.   ADVANCED DIRECTIVES:  In place. The patient has named her sister Pleas Koch [phone]) and her friend Janina Mayo [phone]) as joint healthcare powers of attorney   HEALTH MAINTENANCE: Social History   Tobacco Use   Smoking status: Former     Packs/day: 0.50    Years: 10.00    Pack years: 5.00    Types: Cigarettes    Quit date: 05/03/1970    Years since quitting: 50.9   Smokeless tobacco: Former   Tobacco comments:    quit smoking 1970's  Substance Use Topics   Alcohol use: Yes    Alcohol/week: 0.0 standard drinks    Comment: Rare   Drug use: No     Colonoscopy: never  PAP: status post hysterectomy  Bone density: 09/12/2016 [place]/ T score of -4.8 osteoporosis  Lipid panel:  Allergies  Allergen Reactions   Ativan [Lorazepam]     Made her crazy   Dilaudid [Hydromorphone Hcl]     Doesn't want   Haldol [Haloperidol Lactate]     Made her crazy    Current Outpatient Medications  Medication Sig Dispense Refill   Cholecalciferol (VITAMIN D3 ADULT GUMMIES PO) Take 2,000 Units by mouth daily.      Cyanocobalamin (VITAMIN B-12 PO) Take by mouth daily. Takes every other day     dexamethasone (DECADRON) 4 MG tablet Take 2 tablets (8 mg total) by mouth daily. Start the day after chemotherapy for 2 days. (Patient not taking: Reported on 01/31/2021) 30 tablet 1   diazepam (VALIUM) 5 MG tablet Take one tablet by mouth just before MRI; may repeat once 10 tablet 0   Iron-Vitamins (GERITOL PO) Take by mouth daily.     OVER THE COUNTER MEDICATION OTC Apple Cider Vinegar 1 capsule daily.     prochlorperazine (COMPAZINE) 10 MG tablet Take 1 tablet (10 mg total) by mouth every 6 (six) hours as needed (Nausea or vomiting). (Patient not taking: Reported on 01/31/2021) 30 tablet 1   traMADol (ULTRAM) 50 MG tablet Take 1 tablet (50 mg total) by mouth every 12 (twelve) hours as needed. 30 tablet 0   No current facility-administered medications for this visit.    OBJECTIVE: White woman who appears stated age  There were no vitals filed for this visit.  Wt Readings from Last 3 Encounters:  03/28/21 111 lb 8 oz (50.6 kg)  03/21/21 111 lb 1.6 oz (50.4 kg)  02/28/21 114 lb 1.6 oz (51.8 kg)   There is no height or weight on file to  calculate BMI.    ECOG FS:1 - Symptomatic but completely ambulatory  Telephone visit 04/17/2021    LAB RESULTS:  CMP     Component Value Date/Time   NA 141 03/28/2021 1337   NA 141 05/29/2017 1417   K 3.8 03/28/2021 1337   K 3.6 05/29/2017 1417  CL 105 03/28/2021 1337   CO2 25 03/28/2021 1337   CO2 27 05/29/2017 1417   GLUCOSE 105 (H) 03/28/2021 1337   GLUCOSE 111 05/29/2017 1417   BUN 6 (L) 03/28/2021 1337   BUN 17.0 05/29/2017 1417   CREATININE 0.64 03/28/2021 1337   CREATININE 0.66 03/05/2020 1218   CREATININE 0.8 05/29/2017 1417   CALCIUM 9.6 03/28/2021 1337   CALCIUM 10.7 (H) 06/26/2017 1216   CALCIUM 10.4 05/29/2017 1417   PROT 6.3 (L) 03/28/2021 1337   PROT 7.3 05/29/2017 1417   ALBUMIN 3.2 (L) 03/28/2021 1337   ALBUMIN 3.9 05/29/2017 1417   AST 26 03/28/2021 1337   AST 34 12/02/2019 1147   AST 17 05/29/2017 1417   ALT 15 03/28/2021 1337   ALT 30 12/02/2019 1147   ALT 15 05/29/2017 1417   ALKPHOS 50 03/28/2021 1337   ALKPHOS 42 05/29/2017 1417   BILITOT 0.5 03/28/2021 1337   BILITOT 0.4 12/02/2019 1147   BILITOT 0.30 05/29/2017 1417   GFRNONAA >60 03/28/2021 1337   GFRNONAA 83 03/05/2020 1218   GFRAA 97 03/05/2020 1218    INo results found for: SPEP, UPEP  Lab Results  Component Value Date   WBC 5.6 03/28/2021   NEUTROABS 3.6 03/28/2021   HGB 13.8 03/28/2021   HCT 39.4 03/28/2021   MCV 99.0 03/28/2021   PLT 263 03/28/2021      Chemistry      Component Value Date/Time   NA 141 03/28/2021 1337   NA 141 05/29/2017 1417   K 3.8 03/28/2021 1337   K 3.6 05/29/2017 1417   CL 105 03/28/2021 1337   CO2 25 03/28/2021 1337   CO2 27 05/29/2017 1417   BUN 6 (L) 03/28/2021 1337   BUN 17.0 05/29/2017 1417   CREATININE 0.64 03/28/2021 1337   CREATININE 0.66 03/05/2020 1218   CREATININE 0.8 05/29/2017 1417      Component Value Date/Time   CALCIUM 9.6 03/28/2021 1337   CALCIUM 10.7 (H) 06/26/2017 1216   CALCIUM 10.4 05/29/2017 1417   ALKPHOS 50  03/28/2021 1337   ALKPHOS 42 05/29/2017 1417   AST 26 03/28/2021 1337   AST 34 12/02/2019 1147   AST 17 05/29/2017 1417   ALT 15 03/28/2021 1337   ALT 30 12/02/2019 1147   ALT 15 05/29/2017 1417   BILITOT 0.5 03/28/2021 1337   BILITOT 0.4 12/02/2019 1147   BILITOT 0.30 05/29/2017 1417       Lab Results  Component Value Date   LABCA2 24 01/01/2016    No components found for: UMPNT614  No results for input(s): INR in the last 168 hours.  Urinalysis    Component Value Date/Time   COLORURINE YELLOW 03/05/2020 [place] 03/05/2020 1218   LABSPEC 1.006 03/05/2020 [place] 7.0 03/05/2020 [place] 03/05/2020 [place] 03/05/2020 [place] 09/19/2015 [place] 03/05/2020 [place] 03/05/2020 1218   NITRITE NEGATIVE 03/05/2020 1218   LEUKOCYTESUR 2+ (A) 03/05/2020 1218    STUDIES: No results found.   ASSESSMENT: 80 y.o. [place] woman with stage IV left breast cancer involving bone  (1) status post right lumpectomy and axillary node dissection in 1985 followed by radiation at [facility] 08/16/2014: measurable disease in spine, lung and left breast   (2) evaluation for left shoulder pain led to thoracic spine MRI 08/16/2014 showing a pathologic fracture at T3 with epidural  tumor displacing the cord to the right, but no cord compression. CT scans of the chest, abdomen and pelvis 08/24/2014 showed in addition a mass in the upper outer quadrant left breast measuring 1.6 cm and a nodule in the minor fissure of the right lung measuring 1.2 cm, but no parenchymal lung or liver lesions.   (a) CA 27-29 was noninformative at 38 (09/20/2014)    (3) mammography and ultrasonography 08/29/2014 show a mass in the upper inner left breast which was palpable,  measuring 2.0 cm by ultrasound. Biopsy of this mass 08/29/2014 showed an invasive breast cancer with both lobular and  ductal features, estrogen receptor positive, progesterone receptor weakly positive, with an MIB-1 in the 40% range, HER-2 equivocal (6 else ratio 1.5, but average number her nucleus 5.8)   (4) letrozole started 08/31/2014;   (a) palbociclib added Sept 2016 at 75 mg 21/7, with significant neutropenia; not repeated after first cycle  (b) letrozole discontinued 05/29/2017 with evidence of disease progression in the breast   (5)  zolendronate started 09/20/2014, stopped after initial dose due to poor tolerance  (a) denosumab/ Xgeva started 01/31/2015, repeated every 4 weeks  (b) changed to every 8 weeks as of August 2018.   (c) every 12 weeks recommended and ordered to start 01/2020   (6) on 10/20/2014 the patient underwent T2-T3 and T4 decompressive laminectomy with removal of epidural tumor, C7-T4 segmental pedicle screw instrumentation with virage screw system with arrow guidance protocol and C7-T4 posterolateral fusion. The cells were positive for the estrogen receptor. HER-2/neu testing by [facility] showed again equivocal results,  (a) most recent bone scan 10/01/2016 finds no new lesions only postoperative changes  (7) radiation 11/23/2014-12/11/2014.  (a) T1-T5 was treated to 30 Gy in 12 fractions at 2.5 Gy per fraction   (8) initiated close follow-up while considering eventual left lumpectomy or mastectomy depending on  longer-term results of systemic therapy  (a) most recent left breast ultrasonography 10/30/2016 found this mass to measure 0.7 cm  (b) repeat left breast ultrasonography 05/13/2017 showed the upper outer quadrant mass to have grown to 1.4 cm  (c) left breast ultrasound 09/08/2017 shows the mass to now measure 0.9 cm  (d) left breast biopsy x2 on 12/11/2017 shows high-grade ductal carcinoma in situ, essentially estrogen receptor negative  (9) genetics testing using the Breast/Ovarian Cancer Panel through [name], MD) found no deleterious mutations in ATM, BARD1, BRCA1,  BRCA2, BRIP1, CDH1, CHEK2, EPCAM, FANCC, MLH1, MSH2, MSH6, NBN, PALB2, PMS2, PTEN, RAD51C, RAD51D, STK11, TP53, or XRCC2    (10) right thyroid nodule is a complex cyst as noted on CT scan of the neck 01/29/2016  (11) osteoporosis: [facility] density at [facility] 09/12/2016 showed a T score of -4.8.  (a) switched to Prolia starting 03/29/2020  (12) fulvestrant started 05/29/2017, last dose 07/22/2018 (discontinued due to pandemic).  (a) started palbociclib 06/29/2017 at 75 mg every other day for 21 days on, 7 days off  (b) palbociclib discontinued on 3/29 due to progressive fatigue and patient preference  (13)  was to start abemaciclib 02/04/2018, but patient opted for going back to palbociclib at a lower dose beginning in November 19, patient subsequently declined s palbociclib, and  proceeded to surgery  (14) status post left lumpectomy 05/03/2018 showing a pT1b pNX invasive ductal carcinoma, grade 2, with equivocal HER-2 results  (a) opted against adjuvant radiation given presence of stage IV disease  (15) tamoxifen supposed to have been started started 09/13/2018 as a "bridge" pending resumption of  fulvestrant/denosumab, but never started by the patient  (16) PET scan 11/02/2018 showed no active disease  (a) [place] scan on 10/31/2019 shows activity in 2 liver lesions, no active bone or lung lesions  (b) abdominal ultrasound 11/08/2019 confirms 2 liver lesions measuring 4.4 and 2.1 cm.  (c) biopsy of 1 of the liver lesions 12/12/2019 confirmed metastatic breast cancer, estrogen and progesterone receptor positive, with an MIB-1 of 10%, and HER-2 positive, with a copy number of 2.12 and the number per cell 6.25  (d) abdominal ultrasound 03/12/2020 shows one of the liver lesions to have increased, while the second lesion has decreased  (17) letrozole started 02/15/2019, discontinued 12/02/2019 with evidence of progression  (a) bone density 09/12/2016 showed a T score of -4.8.  (18) fulvestrant  resumed 12/08/2019  (a) palbociclib added at 75 mg daily 21/7 starting 12/20/2019   (b) fulvestrant and palbociclib discontinued September 2022 with evidence of progression.  (19) trastuzumab started 04/25/2020, repeat every 28 days  (a) echo 03/12/2020 shows an ejection fraction in the 60-65% range  (b) changed to subQ formulation of trastuzumab 06/20/2020  (c) trastuzumab discontinued September 2022 with MRI of the abdomen showing evidence of progression  (20) starting Enhertu on 02/07/2021  (A) echo 11/21/2020 showed an ejection fraction in the 60-65% range  (B) Enhertu changed to every 4 weeks after the second dose to allow for additional recovery time   PLAN: Trinita is having a variety of symptoms that in themselves might not obviate her ability to receive treatment in 2 days, but I am concerned that neither I nor my partners dealing with breast cancer nor our nurse practitioner are here to reevaluate her.  I think it is safe for her to move treatment back a week and that is what we are doing.  We will see her on 04/25/2021.  Hopefully she will receive her fourth dose of Enhertu at that time.  Her next dose then will be the last week in December.  We have measurable disease in the liver and we do need to assess for response.  Accordingly I am setting her up for repeat MRI of the abdomen before the late December visit.  Total encounter time 20 minutes.Sarajane Jews C. Korynn Kenedy, MD 04/17/21 11:24 AM Medical Oncology and Hematology [facility] [place], [place] [address] Tel. [phone]    Fax. [phone]   I, Wilburn Mylar, am acting as scribe for Dr. [name]. Coltan Spinello.  I, Lurline Del MD, have reviewed the above documentation for accuracy and completeness, and I agree with the above.   *Total Encounter Time as defined by the Centers for Medicare and Medicaid Services includes, in addition to the face-to-face time of a patient visit  (documented in the note above) non-face-to-face time: obtaining and reviewing outside history, ordering and reviewing medications, tests or procedures, care coordination (communications with other health care professionals or caregivers) and documentation in the medical record.

## 2021-04-19 ENCOUNTER — Ambulatory Visit: Payer: Medicare Other

## 2021-04-19 ENCOUNTER — Other Ambulatory Visit: Payer: Medicare Other

## 2021-04-19 ENCOUNTER — Telehealth: Payer: Self-pay | Admitting: Oncology

## 2021-04-19 NOTE — Telephone Encounter (Signed)
Sch per 11/23 los, pt aware 

## 2021-04-22 ENCOUNTER — Other Ambulatory Visit (HOSPITAL_COMMUNITY): Payer: Self-pay

## 2021-04-24 MED FILL — Dexamethasone Sodium Phosphate Inj 100 MG/10ML: INTRAMUSCULAR | Qty: 1 | Status: AC

## 2021-04-25 ENCOUNTER — Ambulatory Visit (HOSPITAL_COMMUNITY)
Admission: RE | Admit: 2021-04-25 | Discharge: 2021-04-25 | Disposition: A | Discharge status: Home / Self Care | Payer: Medicare Other | Source: Ambulatory Visit | Attending: Adult Health | Admitting: Adult Health

## 2021-04-25 ENCOUNTER — Other Ambulatory Visit: Payer: Self-pay

## 2021-04-25 ENCOUNTER — Inpatient Hospital Stay: Payer: Medicare Other | Attending: Oncology

## 2021-04-25 ENCOUNTER — Other Ambulatory Visit: Payer: Medicare Other

## 2021-04-25 ENCOUNTER — Inpatient Hospital Stay: Payer: Medicare Other

## 2021-04-25 ENCOUNTER — Encounter: Payer: Self-pay | Admitting: Adult Health

## 2021-04-25 ENCOUNTER — Inpatient Hospital Stay (HOSPITAL_BASED_OUTPATIENT_CLINIC_OR_DEPARTMENT_OTHER): Payer: Medicare Other | Admitting: Adult Health

## 2021-04-25 VITALS — BP 154/91 | HR 106 | Temp 97.7°F | Resp 18 | Ht 61.0 in | Wt 107.3 lb

## 2021-04-25 DIAGNOSIS — C50919 Malignant neoplasm of unspecified site of unspecified female breast: Secondary | ICD-10-CM

## 2021-04-25 DIAGNOSIS — Z5112 Encounter for antineoplastic immunotherapy: Secondary | ICD-10-CM | POA: Insufficient documentation

## 2021-04-25 DIAGNOSIS — Z170 Estrogen receptor positive status [ER+]: Secondary | ICD-10-CM | POA: Insufficient documentation

## 2021-04-25 DIAGNOSIS — C50912 Malignant neoplasm of unspecified site of left female breast: Secondary | ICD-10-CM | POA: Insufficient documentation

## 2021-04-25 DIAGNOSIS — M7989 Other specified soft tissue disorders: Secondary | ICD-10-CM

## 2021-04-25 DIAGNOSIS — Z7901 Long term (current) use of anticoagulants: Secondary | ICD-10-CM | POA: Insufficient documentation

## 2021-04-25 DIAGNOSIS — Z87891 Personal history of nicotine dependence: Secondary | ICD-10-CM | POA: Insufficient documentation

## 2021-04-25 DIAGNOSIS — I82401 Acute embolism and thrombosis of unspecified deep veins of right lower extremity: Secondary | ICD-10-CM | POA: Insufficient documentation

## 2021-04-25 DIAGNOSIS — M810 Age-related osteoporosis without current pathological fracture: Secondary | ICD-10-CM | POA: Insufficient documentation

## 2021-04-25 DIAGNOSIS — C7951 Secondary malignant neoplasm of bone: Secondary | ICD-10-CM

## 2021-04-25 DIAGNOSIS — C50412 Malignant neoplasm of upper-outer quadrant of left female breast: Secondary | ICD-10-CM | POA: Diagnosis not present

## 2021-04-25 DIAGNOSIS — I82411 Acute embolism and thrombosis of right femoral vein: Secondary | ICD-10-CM | POA: Diagnosis not present

## 2021-04-25 DIAGNOSIS — C787 Secondary malignant neoplasm of liver and intrahepatic bile duct: Secondary | ICD-10-CM | POA: Insufficient documentation

## 2021-04-25 LAB — CBC WITH DIFFERENTIAL/PLATELET
Abs Immature Granulocytes: 0.02 10*3/uL (ref 0.00–0.07)
Basophils Absolute: 0.1 10*3/uL (ref 0.0–0.1)
Basophils Relative: 1 %
Eosinophils Absolute: 0.2 10*3/uL (ref 0.0–0.5)
Eosinophils Relative: 4 %
HCT: 43 % (ref 36.0–46.0)
Hemoglobin: 14.4 g/dL (ref 12.0–15.0)
Immature Granulocytes: 0 %
Lymphocytes Relative: 23 %
Lymphs Abs: 1.2 10*3/uL (ref 0.7–4.0)
MCH: 33 pg (ref 26.0–34.0)
MCHC: 33.5 g/dL (ref 30.0–36.0)
MCV: 98.4 fL (ref 80.0–100.0)
Monocytes Absolute: 0.6 10*3/uL (ref 0.1–1.0)
Monocytes Relative: 12 %
Neutro Abs: 3.1 10*3/uL (ref 1.7–7.7)
Neutrophils Relative %: 60 %
Platelets: 229 10*3/uL (ref 150–400)
RBC: 4.37 MIL/uL (ref 3.87–5.11)
RDW: 14.2 % (ref 11.5–15.5)
WBC: 5.2 10*3/uL (ref 4.0–10.5)
nRBC: 0 % (ref 0.0–0.2)

## 2021-04-25 LAB — COMPREHENSIVE METABOLIC PANEL
ALT: 16 U/L (ref 0–44)
AST: 26 U/L (ref 15–41)
Albumin: 3.3 g/dL — ABNORMAL LOW (ref 3.5–5.0)
Alkaline Phosphatase: 52 U/L (ref 38–126)
Anion gap: 8 (ref 5–15)
BUN: 7 mg/dL — ABNORMAL LOW (ref 8–23)
CO2: 27 mmol/L (ref 22–32)
Calcium: 9.4 mg/dL (ref 8.9–10.3)
Chloride: 105 mmol/L (ref 98–111)
Creatinine, Ser: 0.67 mg/dL (ref 0.44–1.00)
GFR, Estimated: >60 mL/min (ref >60)
Glucose, Bld: 95 mg/dL (ref 70–99)
Potassium: 4.1 mmol/L (ref 3.5–5.1)
Sodium: 140 mmol/L (ref 135–145)
Total Bilirubin: 0.5 mg/dL (ref 0.3–1.2)
Total Protein: 6.4 g/dL — ABNORMAL LOW (ref 6.5–8.1)

## 2021-04-25 MED ORDER — RIVAROXABAN (XARELTO) VTE STARTER PACK (15 & 20 MG): ORAL_TABLET | ORAL | 0 refills | Status: DC

## 2021-04-25 NOTE — Patient Instructions (Signed)
Rivaroxaban Tablets What is this medication? RIVAROXABAN (ri va ROX a ban) prevents or treats blood clots. It is also used to lower the risk of stroke in people with AFib (atrial fibrillation). It can be used to lower the risk of heart attack or stroke in people with heart or peripheral artery disease. It belongs to a group of medications called blood thinners. This medicine may be used for other purposes; ask your health care provider or pharmacist if you have questions. COMMON BRAND NAME(S): Xarelto, Xarelto Starter Pack What should I tell my care team before I take this medication? They need to know if you have any of these conditions: Antiphospholipid antibody syndrome Bleeding disorders Bleeding in the brain Blood clots Kidney disease Liver disease Prosthetic heart valve Recent or planned spinal or epidural procedure Stomach bleeding Take medications that treat or prevent blood clots An unusual or allergic reaction to rivaroxaban, other medications, foods, dyes, or preservatives Pregnant or trying to get pregnant Breast-feeding How should I use this medication? Take this medication by mouth. For your therapy to work as well as possible, take each dose exactly as prescribed on the prescription label. Do not skip doses. Skipping doses or stopping this medication can increase your risk of a blood clot or stroke. Keep taking this medication unless your care team tells you to stop. If you are taking this medication after hip or knee replacement surgery, take it with or without food. If you are taking this medication for atrial fibrillation, take it with your evening meal. If you are taking this medication to treat blood clots, take it with food at the same time each day. If you are taking this medication for coronary artery disease or peripheral artery disease, take it with or without food at the same time every day. If you are unable to swallow your tablet, you may crush the tablet and mix it  in applesauce. Then, immediately eat the applesauce. You should eat more food right after you eat the applesauce containing the crushed tablet. A special MedGuide will be given to you by the pharmacist with each prescription and refill. Be sure to read this information carefully each time. Talk to your care team about the use of this medication in children. While it may be prescribed for children as young as newborns for selected conditions, precautions do apply. Overdosage: If you think you have taken too much of this medicine contact a poison control center or emergency room at once. NOTE: This medicine is only for you. Do not share this medicine with others. What if I miss a dose? If you take your medication once a day and miss a dose, take it as soon as you can. If it is almost time for your next dose, take only that dose. Do not take double or extra doses. If you are taking this medication twice a day to treat blood clots and miss a dose, take the missed dose as soon as you remember. In this instance, 2 tablets may be taken at the same time. The next day you should take 1 tablet twice a day. If you are taking this medication twice a day for coronary artery disease or peripheral artery disease and miss a dose, skip it. Take your next dose at the normal time. Do not take extra or 2 doses at the same time to make up for the missed dose. What may interact with this medication? Do not take this medication with any of the following: Defibrotide This  medication may also interact with the following: Aspirin and aspirin-like medications Certain antibiotics like erythromycin and clarithromycin Certain medications for fungal infections like ketoconazole and itraconazole Certain medications for seizures like carbamazepine, phenytoin Certain medications that treat or prevent blood clots like warfarin, enoxaparin, dalteparin, apixaban, dabigatran, and edoxaban Conivaptan Indinavir Lopinavir;  ritonavir NSAIDS, medications for pain and inflammation, like ibuprofen or naproxen Rifampin Ritonavir SNRIs, medications for depression, like desvenlafaxine, duloxetine, levomilnacipran, venlafaxine SSRIs, medications for depression, like citalopram, escitalopram, fluoxetine, fluvoxamine, paroxetine, sertraline St. John's wort This list may not describe all possible interactions. Give your health care provider a list of all the medicines, herbs, non-prescription drugs, or dietary supplements you use. Also tell them if you smoke, drink alcohol, or use illegal drugs. Some items may interact with your medicine. What should I watch for while using this medication? Visit your care team for regular checks on your progress. You may need blood work done while you are taking this medication. Your condition will be monitored carefully while you are receiving this medication. It is important not to miss any appointments. Avoid sports and activities that might cause injury while you are using this medication. Severe falls or injuries can cause unseen bleeding. Be careful when using sharp tools or knives. Consider using an electric razor. Take special care brushing or flossing your teeth. Report any injuries, bruising, or red spots on the skin to your care team. Wear a medical ID bracelet or chain. Carry a card that describes your condition. List the medications and doses you take on the card. Tell your dentist and dental surgeon that you are taking this medication. You should not have major dental surgery while on this medication. See your dentist to have a dental exam and fix any dental problems before starting this medication. Take good care of your teeth while on this medication. Make sure you see your dentist for regular follow-up appointments. If you are going to need surgery or other procedure, tell your care team that you are using this medication. Do not become pregnant while taking this medication. Women  should inform their care team if they wish to become pregnant or think they might be pregnant. There is potential for serious harm to an unborn child. Talk to your care team for more information. What side effects may I notice from receiving this medication? Side effects that you should report to your care team as soon as possible: Allergic reactions--skin rash, itching, hives, swelling of the face, lips, tongue, or throat Bleeding--bloody or black, tar-like stools, vomiting blood or brown material that looks like coffee grounds, red or dark brown urine, small red or purple spots on skin, unusual bruising or bleeding Bleeding in the brain--severe headache, stiff neck, confusion, dizziness, change in vision, numbness or weakness of the face, arm, or leg, trouble speaking, trouble walking, vomiting Heavy periods This list may not describe all possible side effects. Call your doctor for medical advice about side effects. You may report side effects to FDA at 1-800-FDA-1088. Where should I keep my medication? Keep out of the reach of children and pets. Store at room temperature between 20 and 25 degrees C (68 and 77 degrees F). Get rid of any unused medication after the expiration date. To get rid of medications that are no longer needed or have expired: Take the medication to a medication take-back program. Check your pharmacy or law enforcement to find a location. If you cannot return the medication, check the label or package insert to see   if the medication should be thrown out in the garbage or flushed down the toilet. If you are not sure, ask your care team. If it is safe to put it in the trash, empty the medication out of the container. Mix the medication with cat litter, dirt, coffee grounds, or other unwanted substance. Seal the mixture in a bag or container. Put it in the trash. NOTE: This sheet is a summary. It may not cover all possible information. If you have questions about this medicine,  talk to your doctor, pharmacist, or health care provider.  2022 [employer] (2020-07-06 00:00:00)

## 2021-04-25 NOTE — Progress Notes (Signed)
Right lower extremity venous duplex has been completed. Preliminary results can be found in [facility] Proc through chart review.  Results were given to Wilber Bihari NP  04/25/21 2:20 PM Carlos Levering RVT

## 2021-04-25 NOTE — Progress Notes (Addendum)
[place]  [phone] [phone]    ID: April Rosales DOB: 1940-07-17  MR#: [id number]  BZJ#:[id number]  Patient Care Team: April Pinto, MD as PCP - General (Internal Medicine) Rosales, April Dad, MD as Consulting Physician (Oncology) April Killings, MD as Consulting Physician (Orthopedic Surgery) April Skates, MD as Consulting Physician (General Surgery) April Dresser, MD as Consulting Physician (Cardiology) April Rosales, RPH-CPP (Pharmacist) OTHER: April Rosales, DDS    CHIEF COMPLAINT: Stage IV estrogen receptor positive breast cancer  CURRENT TREATMENT: Enhertu; denosumab/Prolia every 6 months   INTERVAL HISTORY: April Rosales was contacted today for follow up of her metastatic breast cancer.    April Rosales was switched to [facility] on 02/07/2021 after MRI of the abdomen 01/24/2021 showed progression in her liver metastases.  She received her third dose 03/28/2021.  She Rosales due for repeat 04/19/2021.  She underwent repeat echocardiogram on 03/05/2021 showing stability with an ejection fraction of 60-65%.    We also received denosumab/Prolia every 6 months for osteoporosis.  She started this on 03/28/2020.  Her most recent dose was 02/28/2021.     REVIEW OF SYSTEMS:  Review of Systems  Constitutional:  Positive for appetite change, fatigue and unexpected weight change. Negative for chills, diaphoresis and fever.  HENT:   Negative for hearing loss, lump/mass and trouble swallowing.   Eyes:  Negative for eye problems and icterus.  Respiratory:  Negative for chest tightness, cough and shortness of breath.   Cardiovascular:  Positive for leg swelling. Negative for chest pain and palpitations.  Gastrointestinal:  Positive for constipation. Negative for abdominal distention, abdominal pain, diarrhea, nausea and vomiting.  Endocrine: Negative for hot flashes.  Genitourinary:  Negative for difficulty urinating.   Musculoskeletal:  Negative  for arthralgias.  Skin:  Negative for itching and rash.  Neurological:  Negative for dizziness, extremity weakness, headaches and numbness.  Hematological:  Negative for adenopathy. Does not bruise/bleed easily.  Psychiatric/Behavioral:  Negative for depression. The patient Rosales not nervous/anxious.      COVID 19 VACCINATION STATUS: [place] x2, most recently 11/2019, no boosters as of September 2022   BREAST CANCER HISTORY: From the original intake note:   April Rosales underwent right lumpectomy in [facility] at [facility] for what sounds like a stage I breast cancer. She tells me she had more than 30 lymph nodes removed from her right axilla and all of them were clear. She received  adjuvant radiation but no systemic treatment.  The patient had recently refused mammography with the last mammogram I can find dating back to August 2010.  More recently the patient presented with left scapular pain radiating down the left arm.  She was evaluated by Dr. Lorin Rosales, who obtained a chest x-ray showing a possible abnormality at T3. He then set up the patient for an MRI of the thoracic spine performed 08/16/2014.  This showed multiple compression fractures  (a similar picture had been noted on lumbar MRI 10/09/2011 ). However at T3 they noted tumor in the vertebral body extending into the left pedicle and into the lateral epidural space, displacing the cord to the right. There was no evidence of cord compression or cord signal abnormality. There were no other areas of tumor identified in the thoracic spine.         The patient was then referred to Dr. Hal Rosales who on 08/24/2014 set April Rosales up for CT scans of the chest, abdomen and pelvis. There was a dense mass in the upper  inner quadrant of the left breast measuring 1.6 cm. There was a 1.2 cm nodule in the minor fissure of the right lung and some evidence of right lung fibrosis at the site of the prior radiation port.  There was also a thyroid mass measuring 2  cm. However there were no parenchymal lung or liver lesions. Incidental meningoceles were noted as well as sclerosis of the fifth and sixth ribs which were felt to be likely posttraumatic.   On 08/29/2014 the patient underwent bilateral diagnostic mammography with tomosynthesis and left breast ultrasonography.  There were postsurgical changes in the upper right breast. In the left breast there was an irregular mass measuring 2.3 cm in the upper inner quadrant. This was palpable. Ultrasound showed this to be hypoechoic and to measure 2.0 cm. There were adjacent areas of nodularity. There was no definite lymphadenopathy in the left axilla.  Biopsy of this breast mass 08/29/2014 showed an invasive adenocarcinoma with both ductal and lobular features (there was strong diffuse E-cadherin expression as well as areas with total absence of E-cadherin expression), with the preliminary prognostic profile showing strong estrogen positivity, very weak to near absent progesterone positivity, an MIB-1 of approximately 40%, and HER-2 equivocal  The patient's subsequent history Rosales as detailed below.   PAST MEDICAL HISTORY: Past Medical History:  Diagnosis Date   Arthritis    Breast cancer ([place]) 1985/2016   takes [name] daily   Chronic back pain    stenosis/listhesis   Family history of ovarian cancer    Osteoporosis    takes Vit D   Radiation 11/23/14-12/11/14   30 Gy T1-T5     PAST SURGICAL HISTORY: Past Surgical History:  Procedure Laterality Date   ABDOMINAL HYSTERECTOMY     1980   APPENDECTOMY  0737   APPLICATION OF INTRAOPERATIVE CT SCAN N/A 10/20/2014   Procedure: APPLICATION OF INTRAOPERATIVE CAT SCAN;  Surgeon: [name], MD;  Location: [facility];  Service: Neurosurgery;  Laterality: N/A;   BREAST LUMPECTOMY WITH RADIOACTIVE SEED LOCALIZATION Left 05/03/2018   Procedure: LEFT BREAST LUMPECTOMY WITH BRACKETED RADIOACTIVE SEED LOCALIZATION;  Surgeon: April Skates, MD;  Location: [place];  Service: General;  Laterality: Left;   BREAST SURGERY Right 1985   THYROID CYST EXCISION  1967    FAMILY HISTORY Family History  Problem Relation Age of Onset   Heart disease Mother    Hypertension Mother    Heart disease Father    Diabetes Father    Breast cancer Paternal Aunt        4 paternal aunts with breast cancer over 19   Prostate cancer Paternal Uncle    Stroke Paternal Grandfather    Ovarian cancer Paternal Aunt    Huntington's disease Other        Nephew, inherited from his father  The patient's father died from a heart attack at the age of 17. He had 9 sisters. 3 of those sisters had breast cancer, all in a menopausal setting. Another sister had ovarian cancer. One of the paternal uncles had cancer of the colon "and back".  The patient's mother died at the age of 52. She was found to have breast cancer shortly before dying , during her final hospitalization.   GYNECOLOGIC HISTORY:  No LMP recorded. Patient has had a hysterectomy.  Menarche age 63, first live birth age 61, the patient Rosales GX P1. She underwent hysterectomy in 1980. She thinks the ovaries were removed, but the CT scan obtained 08/24/2014  showed a definite right ovary. The left ovary may have been removed. She did not take hormone replacement after the hysterectomy.   SOCIAL HISTORY:   Myleka worked in Psychiatric nurse. She Rosales divorced, lives alone, with 2 cats. Her son Bethann Berkshire lives in [place] where he works in Engineer, technical sales. He has 4 children of his own. The patient Rosales a Psychologist, forensic.   ADVANCED DIRECTIVES:  In place. The patient has named her sister Pleas Koch [phone]) and her friend Janina Mayo [phone]) as joint healthcare powers of attorney   HEALTH MAINTENANCE: Social History   Tobacco Use   Smoking status: Former    Packs/day: 0.50    Years: 10.00    Pack years: 5.00    Types: Cigarettes    Quit date: 05/03/1970    Years since quitting: 51.0   Smokeless  tobacco: Former   Tobacco comments:    quit smoking 1970's  Substance Use Topics   Alcohol use: Yes    Alcohol/week: 0.0 standard drinks    Comment: Rare   Drug use: No     Colonoscopy: never  PAP: status post hysterectomy  Bone density: 09/12/2016 [facility]/ T score of -4.8 osteoporosis  Lipid panel:  Allergies  Allergen Reactions   Ativan [Lorazepam]     Made her crazy   Dilaudid [Hydromorphone Hcl]     Doesn't want   Haldol [Haloperidol Lactate]     Made her crazy    Current Outpatient Medications  Medication Sig Dispense Refill   dexamethasone (DECADRON) 4 MG tablet Take 2 tablets (8 mg total) by mouth daily. Start the day after chemotherapy for 2 days. 30 tablet 1   prochlorperazine (COMPAZINE) 10 MG tablet Take 1 tablet (10 mg total) by mouth every 6 (six) hours as needed (Nausea or vomiting). 30 tablet 1   Cholecalciferol (VITAMIN D3 ADULT GUMMIES PO) Take 2,000 Units by mouth daily.      Cyanocobalamin (VITAMIN B-12 PO) Take by mouth daily. Takes every other day     diazepam (VALIUM) 5 MG tablet Take one tablet by mouth just before MRI; may repeat once 10 tablet 0   Iron-Vitamins (GERITOL PO) Take by mouth daily.     OVER THE COUNTER MEDICATION OTC Apple Cider Vinegar 1 capsule daily.     traMADol (ULTRAM) 50 MG tablet Take 1 tablet (50 mg total) by mouth every 12 (twelve) hours as needed. 30 tablet 0   No current facility-administered medications for this visit.    OBJECTIVE: White woman who appears stated age  49:   04/25/21 1306  BP: (!) 154/91  Pulse: (!) 106  Resp: 18  Temp: 97.7 F (36.5 C)  SpO2: 100%    Wt Readings from Last 3 Encounters:  04/25/21 107 lb 4.8 oz (48.7 kg)  03/28/21 111 lb 8 oz (50.6 kg)  03/21/21 111 lb 1.6 oz (50.4 kg)   Body mass index Rosales 20.27 kg/m.    ECOG FS:1 - Symptomatic but completely ambulatory  GENERAL: Patient Rosales a well thin elderly woman in no apparent distress  HEENT:  Sclerae anicteric.  Oropharynx clear and  moist. No ulcerations or evidence of oropharyngeal candidiasis. Neck Rosales supple.  NODES:  No cervical, supraclavicular, or axillary lymphadenopathy palpated.  BREAST EXAM:  Deferred. LUNGS:  Clear to auscultation bilaterally.  No wheezes or rhonchi. HEART:  Regular rate and rhythm. No murmur appreciated. ABDOMEN:  Soft, nontender.  Positive, normoactive bowel sounds. No organomegaly palpated. MSK:  No focal spinal tenderness to palpation. Full  range of motion bilaterally in the upper extremities. EXTREMITIES: Positive for right leg edema SKIN:  Clear with no obvious rashes or skin changes. No nail dyscrasia. NEURO:  Nonfocal. Well oriented.  Appropriate affect.     LAB RESULTS:  CMP     Component Value Date/Time   NA 140 04/25/2021 1243   NA 141 05/29/2017 1417   K 4.1 04/25/2021 1243   K 3.6 05/29/2017 1417   CL 105 04/25/2021 1243   CO2 27 04/25/2021 1243   CO2 27 05/29/2017 1417   GLUCOSE 95 04/25/2021 1243   GLUCOSE 111 05/29/2017 1417   BUN 7 (L) 04/25/2021 1243   BUN 17.0 05/29/2017 1417   CREATININE 0.67 04/25/2021 1243   CREATININE 0.66 03/05/2020 1218   CREATININE 0.8 05/29/2017 1417   CALCIUM 9.4 04/25/2021 1243   CALCIUM 10.7 (H) 06/26/2017 1216   CALCIUM 10.4 05/29/2017 1417   PROT 6.4 (L) 04/25/2021 1243   PROT 7.3 05/29/2017 1417   ALBUMIN 3.3 (L) 04/25/2021 1243   ALBUMIN 3.9 05/29/2017 1417   AST 26 04/25/2021 1243   AST 34 12/02/2019 1147   AST 17 05/29/2017 1417   ALT 16 04/25/2021 1243   ALT 30 12/02/2019 1147   ALT 15 05/29/2017 1417   ALKPHOS 52 04/25/2021 1243   ALKPHOS 42 05/29/2017 1417   BILITOT 0.5 04/25/2021 1243   BILITOT 0.4 12/02/2019 1147   BILITOT 0.30 05/29/2017 1417   GFRNONAA >60 04/25/2021 1243   GFRNONAA 83 03/05/2020 1218   GFRAA 97 03/05/2020 1218    INo results found for: SPEP, UPEP  Lab Results  Component Value Date   WBC 5.2 04/25/2021   NEUTROABS 3.1 04/25/2021   HGB 14.4 04/25/2021   HCT 43.0 04/25/2021   MCV  98.4 04/25/2021   PLT 229 04/25/2021      Chemistry      Component Value Date/Time   NA 140 04/25/2021 1243   NA 141 05/29/2017 1417   K 4.1 04/25/2021 1243   K 3.6 05/29/2017 1417   CL 105 04/25/2021 1243   CO2 27 04/25/2021 1243   CO2 27 05/29/2017 1417   BUN 7 (L) 04/25/2021 1243   BUN 17.0 05/29/2017 1417   CREATININE 0.67 04/25/2021 1243   CREATININE 0.66 03/05/2020 1218   CREATININE 0.8 05/29/2017 1417      Component Value Date/Time   CALCIUM 9.4 04/25/2021 1243   CALCIUM 10.7 (H) 06/26/2017 1216   CALCIUM 10.4 05/29/2017 1417   ALKPHOS 52 04/25/2021 1243   ALKPHOS 42 05/29/2017 1417   AST 26 04/25/2021 1243   AST 34 12/02/2019 1147   AST 17 05/29/2017 1417   ALT 16 04/25/2021 1243   ALT 30 12/02/2019 1147   ALT 15 05/29/2017 1417   BILITOT 0.5 04/25/2021 1243   BILITOT 0.4 12/02/2019 1147   BILITOT 0.30 05/29/2017 1417       Lab Results  Component Value Date   LABCA2 24 01/01/2016    No components found for: [id number]  No results for input(s): INR in the last 168 hours.  Urinalysis    Component Value Date/Time   COLORURINE YELLOW 03/05/2020 [place] 03/05/2020 1218   LABSPEC 1.006 03/05/2020 [place] 7.0 03/05/2020 [place] 03/05/2020 [place] 03/05/2020 [place] 09/19/2015 [place] 03/05/2020 [place] 03/05/2020 1218   NITRITE NEGATIVE 03/05/2020 1218   LEUKOCYTESUR 2+ (A)  03/05/2020 1218    STUDIES: No results found.   ASSESSMENT: 80 y.o. [place] woman with stage IV left breast cancer involving bone  (1) status post right lumpectomy and axillary node dissection in 1985 followed by radiation at [facility] 08/16/2014: measurable disease in spine, lung and left breast   (2) evaluation for left shoulder pain led to thoracic spine MRI 08/16/2014 showing a pathologic fracture at T3 with epidural tumor displacing the cord to  the right, but no cord compression. CT scans of the chest, abdomen and pelvis 08/24/2014 showed in addition a mass in the upper outer quadrant left breast measuring 1.6 cm and a nodule in the minor fissure of the right lung measuring 1.2 cm, but no parenchymal lung or liver lesions.   (a) CA 27-29 was noninformative at 38 (09/20/2014)    (3) mammography and ultrasonography 08/29/2014 show a mass in the upper inner left breast which was palpable,  measuring 2.0 cm by ultrasound. Biopsy of this mass 08/29/2014 showed an invasive breast cancer with both lobular and ductal features, estrogen receptor positive, progesterone receptor weakly positive, with an MIB-1 in the 40% range, HER-2 equivocal (6 else ratio 1.5, but average number her nucleus 5.8)   (4) letrozole started 08/31/2014;   (a) palbociclib added Sept 2016 at 75 mg 21/7, with significant neutropenia; not repeated after first cycle  (b) letrozole discontinued 05/29/2017 with evidence of disease progression in the breast   (5)  zolendronate started 09/20/2014, stopped after initial dose due to poor tolerance  (a) denosumab/ Xgeva started 01/31/2015, repeated every 4 weeks  (b) changed to every 8 weeks as of August 2018.   (c) every 12 weeks recommended and ordered to start 01/2020   (6) on 10/20/2014 the patient underwent T2-T3 and T4 decompressive laminectomy with removal of epidural tumor, C7-T4 segmental pedicle screw instrumentation with virage screw system with arrow guidance protocol and C7-T4 posterolateral fusion. The cells were positive for the estrogen receptor. HER-2/neu testing by [facility] showed again equivocal results,  (a) most recent bone scan 10/01/2016 finds no new lesions only postoperative changes  (7) radiation 11/23/2014-12/11/2014.  (a) T1-T5 was treated to 30 Gy in 12 fractions at 2.5 Gy per fraction   (8) initiated close follow-up while considering eventual left lumpectomy or mastectomy depending on  longer-term results of  systemic therapy  (a) most recent left breast ultrasonography 10/30/2016 found this mass to measure 0.7 cm  (b) repeat left breast ultrasonography 05/13/2017 showed the upper outer quadrant mass to have grown to 1.4 cm  (c) left breast ultrasound 09/08/2017 shows the mass to now measure 0.9 cm  (d) left breast biopsy x2 on 12/11/2017 shows high-grade ductal carcinoma in situ, essentially estrogen receptor negative  (9) genetics testing using the Breast/Ovarian Cancer Panel through [facility] [name], MD) found no deleterious mutations in ATM, BARD1, BRCA1, BRCA2, BRIP1, CDH1, CHEK2, EPCAM, FANCC, MLH1, MSH2, MSH6, NBN, PALB2, PMS2, PTEN, RAD51C, RAD51D, STK11, TP53, or XRCC2    (10) right thyroid nodule Rosales a complex cyst as noted on CT scan of the neck 01/29/2016  (11) osteoporosis: Bone density at [facility] 09/12/2016 showed a T score of -4.8.  (a) switched to Prolia starting 03/29/2020  (12) fulvestrant started 05/29/2017, last dose 07/22/2018 (discontinued due to pandemic).  (a) started palbociclib 06/29/2017 at 75 mg every other day for 21 days on, 7 days off  (b) palbociclib discontinued on 3/29 due to progressive fatigue and patient preference  (13)  was to start abemaciclib  02/04/2018, but patient opted for going back to palbociclib at a lower dose beginning in November 19, patient subsequently declined s palbociclib, and  proceeded to surgery  (14) status post left lumpectomy 05/03/2018 showing a pT1b pNX invasive ductal carcinoma, grade 2, with equivocal HER-2 results  (a) opted against adjuvant radiation given presence of stage IV disease  (15) tamoxifen supposed to have been started started 09/13/2018 as a "bridge" pending resumption of fulvestrant/denosumab, but never started by the patient  (16) PET scan 11/02/2018 showed no active disease  (a) [place] scan on 10/31/2019 shows activity in 2 liver lesions, no active bone or lung lesions  (b) abdominal ultrasound 11/08/2019  confirms 2 liver lesions measuring 4.4 and 2.1 cm.  (c) biopsy of 1 of the liver lesions 12/12/2019 confirmed metastatic breast cancer, estrogen and progesterone receptor positive, with an MIB-1 of 10%, and HER-2 positive, with a copy number of 2.12 and the number per cell 6.25  (d) abdominal ultrasound 03/12/2020 shows one of the liver lesions to have increased, while the second lesion has decreased  (17) letrozole started 02/15/2019, discontinued 12/02/2019 with evidence of progression  (a) bone density 09/12/2016 showed a T score of -4.8.  (18) fulvestrant resumed 12/08/2019  (a) palbociclib added at 75 mg daily 21/7 starting 12/20/2019   (b) fulvestrant and palbociclib discontinued September 2022 with evidence of progression.  (19) trastuzumab started 04/25/2020, repeat every 28 days  (a) echo 03/12/2020 shows an ejection fraction in the 60-65% range  (b) changed to subQ formulation of trastuzumab 06/20/2020  (c) trastuzumab discontinued September 2022 with MRI of the abdomen showing evidence of progression  (20) starting Enhertu on 02/07/2021  (A) echo 11/21/2020 showed an ejection fraction in the 60-65% range  (B) Enhertu changed to every 4 weeks after the second dose to allow for additional recovery time   PLAN: April Rosales here today for follow-up and treatment of her metastatic breast cancer.  She has continued on treatment with Enhertu however she has had significant difficulty.  Due to this we discussed a variety of options including changing the dose or lengthening the time between each dose.  I reviewed with her her quality and quantity of life.  In that we want to treat her breast cancer but also want her to have a good quality of life as well.  She agrees with this.  I am concerned about this right leg swelling that she Rosales having.  I sent her to undergo a right leg Doppler.  It was positive for a deep vein thrombosis.  She came back after the Doppler and we spoke in detail about  the blood clot, and showed her pictures of what blood clot look like.  We will start Xarelto 15 mg p.o. twice daily.  She will do this for 3 weeks and then she will transition to 20 mg daily.  I gave her written instructions of the above.  We reviewed the risks of Xarelto including bleeding.  She knows that if she falls that she Rosales at increased risk and we want her to avoid injury.  She knows to seek medical attention immediately should she fall and hit her head.  She met with Dr. Jana Hakim who talked to her about her treatment as well.  He recommended she forego today's dose of Enhertu considering her side effects.  We will get the MRI which was scheduled for December 9.  This will evaluate whether or not the treatment Rosales working.  Once we get the  MRI we will call her with the results and next steps.  April Rosales has a follow-up appointment on May 22, 2021.  At that point she will resume Enhertu if it Rosales indicated.  She knows to call for any questions or concerns that may arise between now and her next appointment.  Total encounter time 30 minutes.Wilber Bihari, NP 04/25/21 1:26 PM Medical Oncology and Hematology [facility] [place], [place] [address] Tel. [phone]    Fax. [phone]   ADDENDUM: April Rosales Rosales very concerned that she Rosales having significant side effects from the Enhertu.  At the same time she Rosales very reluctant to not receive it full dose and on time.  Basically what we need to do Rosales find out if this Rosales working.  If it Rosales working we can consider lowering the dose and seeing what else we can do to make it more tolerable for her.  In the meantime she has developed a right lower extremity DVT.  She Rosales being started on anticoagulants today.  She knows to call for any problems she may have with these medications.  She has an MRI of the abdomen scheduled for 05/03/2021 and I will call her with those results.  I personally saw this patient and  performed a substantive portion of this encounter with the listed APP documented above.   Chauncey Cruel, MD Medical Oncology and Hematology [facility] [address] [place], [place] [address] Tel. [phone]    Fax. [phone]     *Total Encounter Time as defined by the Centers for Medicare and Medicaid Services includes, in addition to the face-to-face time of a patient visit (documented in the note above) non-face-to-face time: obtaining and reviewing outside history, ordering and reviewing medications, tests or procedures, care coordination (communications with other health care professionals or caregivers) and documentation in the medical record.

## 2021-04-30 ENCOUNTER — Telehealth: Payer: Self-pay | Admitting: Oncology

## 2021-04-30 NOTE — Telephone Encounter (Signed)
Scheduled per sch msg. Called and left msg  

## 2021-05-03 ENCOUNTER — Other Ambulatory Visit (HOSPITAL_COMMUNITY): Payer: Medicare Other

## 2021-05-06 NOTE — Progress Notes (Signed)
[place]  [phone] Fax:(336) [phone]    ID: April Rosales DOB: [birth year]  MR#: [id number]  JQZ#:[id number]  Patient Care Team: April Pinto, MD as PCP - General (Internal Medicine) April Rosales, April Dad, MD as Consulting Physician (Oncology) April Killings, MD as Consulting Physician (Orthopedic Surgery) April Skates, MD as Consulting Physician (General Surgery) April Dresser, MD as Consulting Physician (Cardiology) April Rosales, RPH-CPP (Pharmacist) OTHER: April Rosales, DDS  I connected with April Rosales on 05/07/21 at  2:15 PM EST by telephone visit and verified that I am speaking with the correct person using two identifiers.   I discussed the limitations, risks, security and privacy concerns of performing an evaluation and management service by telemedicine and the availability of in-person appointments. I also discussed with the patient that there may be a patient responsible charge related to this service. The patient expressed understanding and agreed to proceed.   Other persons participating in the visit and their role in the encounter none  Patient's location: Home Provider's location: [place]   I provided 10 minutes of non face-to-face telephone visit time during this encounter, and > 50% was spent counseling as documented under my assessment & plan.   CHIEF COMPLAINT: Stage IV estrogen receptor positive breast cancer  CURRENT TREATMENT: Enhertu; denosumab/Prolia every 6 months;   INTERVAL HISTORY: April Rosales was contacted today for follow up of her metastatic breast cancer.    Since her last visit, she underwent venous doppler on 04/25/2021 to evaluate her right lower extremity swelling. This confirmed acute deep vein thrombosis involving right femoral vein, popliteal vein, posterior tibial veins, and gastrocnemius veins. No cystic structure found in popliteal fossa.    She was started on rivaroxaban  the same day.  She is tolerating it well, with no severe bleeding or bruising.  She did have bleeding from her gums 1 day, but that resolved without any intervention.  April Rosales was switched to [facility] on 02/07/2021 after MRI of the abdomen 01/24/2021 showed progression in her liver metastases.  She received 3 doses then we have held therapy because her functional status dropped significantly.  Her most recent treatment was 03/28/2021  Her most recent echocardiogram on 03/05/2021 showed stability with an ejection fraction of 60-65%.    We also received denosumab/Prolia every 6 months for osteoporosis.  She started this on 03/28/2020.  Her most recent dose was 02/28/2021.   She is scheduled for restaging abdomen MRI on 05/14/2021.    REVIEW OF SYSTEMS:  April Rosales tells me she is doing "okay".  She feels she has regained her functional status, and is doing housework which is what she normally does for activity.  Sometimes she feels dizzy when she stands up very quickly.  However there have been no falls.  A detailed review of systems today was otherwise stable   COVID 19 VACCINATION STATUS: Gross x2, most recently 11/2019, no boosters as of September 2022   BREAST CANCER HISTORY: From the original intake note:   April Rosales underwent right lumpectomy in 1985 at [facility] for what sounds like a stage I breast cancer. She tells me she had more than 30 lymph nodes removed from her right axilla and all of them were clear. She received  adjuvant radiation but no systemic treatment.  The patient had recently refused mammography with the last mammogram I can find dating back to August 2010.  More recently the patient presented with left scapular pain radiating down  the left arm.  She was evaluated by Dr. [name], who obtained a chest x-ray showing a possible abnormality at T3. He then set up the patient for an MRI of the thoracic spine performed 08/16/2014.  This showed multiple compression fractures   (a similar picture had been noted on lumbar MRI 10/09/2011 ). However at T3 they noted tumor in the vertebral body extending into the left pedicle and into the lateral epidural space, displacing the cord to the right. There was no evidence of cord compression or cord signal abnormality. There were no other areas of tumor identified in the thoracic spine.         The patient was then referred to Dr. Hal Neer who on 08/24/2014 set April Rosales up for CT scans of the chest, abdomen and pelvis. There was a dense mass in the upper inner quadrant of the left breast measuring 1.6 cm. There was a 1.2 cm nodule in the minor fissure of the right lung and some evidence of right lung fibrosis at the site of the prior radiation port.  There was also a thyroid mass measuring 2 cm. However there were no parenchymal lung or liver lesions. Incidental meningoceles were noted as well as sclerosis of the fifth and sixth ribs which were felt to be likely posttraumatic.   On 08/29/2014 the patient underwent bilateral diagnostic mammography with tomosynthesis and left breast ultrasonography.  There were postsurgical changes in the upper right breast. In the left breast there was an irregular mass measuring 2.3 cm in the upper inner quadrant. This was palpable. Ultrasound showed this to be hypoechoic and to measure 2.0 cm. There were adjacent areas of nodularity. There was no definite lymphadenopathy in the left axilla.  Biopsy of this breast mass 08/29/2014 showed an invasive adenocarcinoma with both ductal and lobular features (there was strong diffuse E-cadherin expression as well as areas with total absence of E-cadherin expression), with the preliminary prognostic profile showing strong estrogen positivity, very weak to near absent progesterone positivity, an MIB-1 of approximately 40%, and HER-2 equivocal  The patient's subsequent history is as detailed below.   PAST MEDICAL HISTORY: Past Medical History:  Diagnosis Date    Arthritis    Breast cancer ([place]) 1985/2016   takes [name] daily   Chronic back pain    stenosis/listhesis   Family history of ovarian cancer    Osteoporosis    takes Vit D   Radiation 11/23/14-12/11/14   30 Gy T1-T5     PAST SURGICAL HISTORY: Past Surgical History:  Procedure Laterality Date   ABDOMINAL HYSTERECTOMY     1980   APPENDECTOMY  7867   APPLICATION OF INTRAOPERATIVE CT SCAN N/A 10/20/2014   Procedure: APPLICATION OF INTRAOPERATIVE CAT SCAN;  Surgeon: [name], MD;  Location: [facility];  Service: Neurosurgery;  Laterality: N/A;   BREAST LUMPECTOMY WITH RADIOACTIVE SEED LOCALIZATION Left 05/03/2018   Procedure: LEFT BREAST LUMPECTOMY WITH BRACKETED RADIOACTIVE SEED LOCALIZATION;  Surgeon: April Skates, MD;  Location: [place];  Service: General;  Laterality: Left;   BREAST SURGERY Right 1985   THYROID CYST EXCISION  1967    FAMILY HISTORY Family History  Problem Relation Age of Onset   Heart disease Mother    Hypertension Mother    Heart disease Father    Diabetes Father    Breast cancer Paternal Aunt        4 paternal aunts with breast cancer over 43   Prostate cancer Paternal Uncle    Stroke  Paternal Grandfather    Ovarian cancer Paternal Aunt    Huntington's disease Other        Nephew, inherited from his father  The patient's father died from a heart attack at the age of 18. He had 9 sisters. 3 of those sisters had breast cancer, all in a menopausal setting. Another sister had ovarian cancer. One of the paternal uncles had cancer of the colon "and back".  The patient's mother died at the age of 76. She was found to have breast cancer shortly before dying , during her final hospitalization.   GYNECOLOGIC HISTORY:  No LMP recorded. Patient has had a hysterectomy.  Menarche age 23, first live birth age 11, the patient is GX P1. She underwent hysterectomy in 1980. She thinks the ovaries were removed, but the CT scan obtained 08/24/2014  showed a definite right ovary. The left ovary may have been removed. She did not take hormone replacement after the hysterectomy.   SOCIAL HISTORY:   Zonya worked in Psychiatric nurse. She is divorced, lives alone, with 2 cats. Her son Bethann Berkshire lives in [place] where he works in Engineer, technical sales. He has 4 children of his own. The patient is a Psychologist, forensic.   ADVANCED DIRECTIVES:  In place. The patient has named her sister Pleas Koch [phone]) and her friend Janina Mayo [phone]) as joint healthcare powers of attorney   HEALTH MAINTENANCE: Social History   Tobacco Use   Smoking status: Former    Packs/day: 0.50    Years: 10.00    Pack years: 5.00    Types: Cigarettes    Quit date: 05/03/1970    Years since quitting: 51.0   Smokeless tobacco: Former   Tobacco comments:    quit smoking 1970's  Substance Use Topics   Alcohol use: Yes    Alcohol/week: 0.0 standard drinks    Comment: Rare   Drug use: No     Colonoscopy: never  PAP: status post hysterectomy  Bone density: 09/12/2016 [facility]/ T score of -4.8 osteoporosis  Lipid panel:  Allergies  Allergen Reactions   Ativan [Lorazepam]     Made her crazy   Dilaudid [Hydromorphone Hcl]     Doesn't want   Haldol [Haloperidol Lactate]     Made her crazy    Current Outpatient Medications  Medication Sig Dispense Refill   Cholecalciferol (VITAMIN D3 ADULT GUMMIES PO) Take 2,000 Units by mouth daily.      Cyanocobalamin (VITAMIN B-12 PO) Take by mouth daily. Takes every other day     dexamethasone (DECADRON) 4 MG tablet Take 2 tablets (8 mg total) by mouth daily. Start the day after chemotherapy for 2 days. 30 tablet 1   diazepam (VALIUM) 5 MG tablet Take one tablet by mouth just before MRI; may repeat once 10 tablet 0   Iron-Vitamins (GERITOL PO) Take by mouth daily.     OVER THE COUNTER MEDICATION OTC Apple Cider Vinegar 1 capsule daily.     prochlorperazine (COMPAZINE) 10 MG tablet Take 1 tablet (10 mg total) by  mouth every 6 (six) hours as needed (Nausea or vomiting). 30 tablet 1   RIVAROXABAN (XARELTO) VTE STARTER PACK (15 & 20 MG) Follow package directions: Take one 45m tablet by mouth twice a day. On day 22, switch to one 237mtablet once a day. Take with food. 51 each 0   traMADol (ULTRAM) 50 MG tablet Take 1 tablet (50 mg total) by mouth every 12 (twelve) hours as needed. 30 tablet 0  No current facility-administered medications for this visit.    OBJECTIVE:   There were no vitals filed for this visit.   Wt Readings from Last 3 Encounters:  04/25/21 107 lb 4.8 oz (48.7 kg)  03/28/21 111 lb 8 oz (50.6 kg)  03/21/21 111 lb 1.6 oz (50.4 kg)   There is no height or weight on file to calculate BMI.    ECOG FS:1 - Symptomatic but completely ambulatory  Telephone visit 05/07/2021  LAB RESULTS:  CMP     Component Value Date/Time   NA 140 04/25/2021 1243   NA 141 05/29/2017 1417   K 4.1 04/25/2021 1243   K 3.6 05/29/2017 1417   CL 105 04/25/2021 1243   CO2 27 04/25/2021 1243   CO2 27 05/29/2017 1417   GLUCOSE 95 04/25/2021 1243   GLUCOSE 111 05/29/2017 1417   BUN 7 (L) 04/25/2021 1243   BUN 17.0 05/29/2017 1417   CREATININE 0.67 04/25/2021 1243   CREATININE 0.66 03/05/2020 1218   CREATININE 0.8 05/29/2017 1417   CALCIUM 9.4 04/25/2021 1243   CALCIUM 10.7 (H) 06/26/2017 1216   CALCIUM 10.4 05/29/2017 1417   PROT 6.4 (L) 04/25/2021 1243   PROT 7.3 05/29/2017 1417   ALBUMIN 3.3 (L) 04/25/2021 1243   ALBUMIN 3.9 05/29/2017 1417   AST 26 04/25/2021 1243   AST 34 12/02/2019 1147   AST 17 05/29/2017 1417   ALT 16 04/25/2021 1243   ALT 30 12/02/2019 1147   ALT 15 05/29/2017 1417   ALKPHOS 52 04/25/2021 1243   ALKPHOS 42 05/29/2017 1417   BILITOT 0.5 04/25/2021 1243   BILITOT 0.4 12/02/2019 1147   BILITOT 0.30 05/29/2017 1417   GFRNONAA >60 04/25/2021 1243   GFRNONAA 83 03/05/2020 1218   GFRAA 97 03/05/2020 1218    INo results found for: SPEP, UPEP  Lab Results   Component Value Date   WBC 5.2 04/25/2021   NEUTROABS 3.1 04/25/2021   HGB 14.4 04/25/2021   HCT 43.0 04/25/2021   MCV 98.4 04/25/2021   PLT 229 04/25/2021      Chemistry      Component Value Date/Time   NA 140 04/25/2021 1243   NA 141 05/29/2017 1417   K 4.1 04/25/2021 1243   K 3.6 05/29/2017 1417   CL 105 04/25/2021 1243   CO2 27 04/25/2021 1243   CO2 27 05/29/2017 1417   BUN 7 (L) 04/25/2021 1243   BUN 17.0 05/29/2017 1417   CREATININE 0.67 04/25/2021 1243   CREATININE 0.66 03/05/2020 1218   CREATININE 0.8 05/29/2017 1417      Component Value Date/Time   CALCIUM 9.4 04/25/2021 1243   CALCIUM 10.7 (H) 06/26/2017 1216   CALCIUM 10.4 05/29/2017 1417   ALKPHOS 52 04/25/2021 1243   ALKPHOS 42 05/29/2017 1417   AST 26 04/25/2021 1243   AST 34 12/02/2019 1147   AST 17 05/29/2017 1417   ALT 16 04/25/2021 1243   ALT 30 12/02/2019 1147   ALT 15 05/29/2017 1417   BILITOT 0.5 04/25/2021 1243   BILITOT 0.4 12/02/2019 1147   BILITOT 0.30 05/29/2017 1417       Lab Results  Component Value Date   LABCA2 24 01/01/2016    No components found for: [id number]  No results for input(s): INR in the last 168 hours.  Urinalysis    Component Value Date/Time   COLORURINE YELLOW 03/05/2020 [place] 03/05/2020 1218   LABSPEC 1.006 03/05/2020 1218   PHURINE 7.0 03/05/2020  [place] 03/05/2020 [place] 03/05/2020 [place] 09/19/2015 1239   [place] 03/05/2020 [place] 03/05/2020 1218   NITRITE NEGATIVE 03/05/2020 1218   LEUKOCYTESUR 2+ (A) 03/05/2020 1218    STUDIES: VAS [place] LOWER EXTREMITY VENOUS (DVT)  Result Date: 04/25/2021  Lower Venous DVT Study Patient Name:  OPHIA SHAMOON  Date of Exam:   04/25/2021 Medical Rec #: [id number]         Accession #:    [id number] Date of Birth: [birth year]          Patient Gender: F Patient Age:   35 years Exam Location:  [facility]  Procedure:      VAS [place] LOWER EXTREMITY VENOUS (DVT) Referring Phys: Mendel Ryder CAUSEY --------------------------------------------------------------------------------  Indications: Swelling.  Risk Factors: Cancer. Limitations: Poor ultrasound/tissue interface. Comparison Study: No prior studies. Performing Technologist: Oliver Hum RVT  Examination Guidelines: A complete evaluation includes B-mode imaging, spectral Doppler, color Doppler, and power Doppler as needed of all accessible portions of each vessel. Bilateral testing is considered an integral part of a complete examination. Limited examinations for reoccurring indications may be performed as noted. The reflux portion of the exam is performed with the patient in reverse Trendelenburg.  +---------+---------------+---------+-----------+----------+--------------+ RIGHT    CompressibilityPhasicitySpontaneityPropertiesThrombus Aging +---------+---------------+---------+-----------+----------+--------------+ CFV      Full           Yes      Yes                                 +---------+---------------+---------+-----------+----------+--------------+ SFJ      Full                                                        +---------+---------------+---------+-----------+----------+--------------+ FV Prox  Partial        Yes      Yes                  Acute          +---------+---------------+---------+-----------+----------+--------------+ FV Mid   None           No       No                   Acute          +---------+---------------+---------+-----------+----------+--------------+ FV DistalNone           No       No                   Acute          +---------+---------------+---------+-----------+----------+--------------+ PFV      Full                                                        +---------+---------------+---------+-----------+----------+--------------+ POP      Partial        No       No  Acute          +---------+---------------+---------+-----------+----------+--------------+ PTV      None                                         Acute          +---------+---------------+---------+-----------+----------+--------------+ PERO     Full                                                        +---------+---------------+---------+-----------+----------+--------------+ Gastroc  Partial                                      Acute          +---------+---------------+---------+-----------+----------+--------------+   +----+---------------+---------+-----------+----------+--------------+ LEFTCompressibilityPhasicitySpontaneityPropertiesThrombus Aging +----+---------------+---------+-----------+----------+--------------+ CFV Full           Yes      Yes                                 +----+---------------+---------+-----------+----------+--------------+    Summary: RIGHT: - Findings consistent with acute deep vein thrombosis involving the right femoral vein, right popliteal vein, right posterior tibial veins, and right gastrocnemius veins. - No cystic structure found in the popliteal fossa.  LEFT: - No evidence of common femoral vein obstruction.  *See table(s) above for measurements and observations. Electronically signed by Monica Martinez MD on 04/25/2021 at 5:29:40 PM.    Final      ASSESSMENT: 81 y.o. [place] woman with stage IV left breast cancer involving bone  (1) status post right lumpectomy and axillary node dissection in 1985 followed by radiation at [facility] 08/16/2014: measurable disease in spine, lung and left breast   (2) evaluation for left shoulder pain led to thoracic spine MRI 08/16/2014 showing a pathologic fracture at T3 with epidural tumor displacing the cord to the right, but no cord compression. CT scans of the chest, abdomen and pelvis 08/24/2014 showed in addition a mass in the upper outer quadrant left breast measuring  1.6 cm and a nodule in the minor fissure of the right lung measuring 1.2 cm, but no parenchymal lung or liver lesions.   (a) CA 27-29 was noninformative at 38 (09/20/2014)    (3) mammography and ultrasonography 08/29/2014 show a mass in the upper inner left breast which was palpable,  measuring 2.0 cm by ultrasound. Biopsy of this mass 08/29/2014 showed an invasive breast cancer with both lobular and ductal features, estrogen receptor positive, progesterone receptor weakly positive, with an MIB-1 in the 40% range, HER-2 equivocal (6 else ratio 1.5, but average number her nucleus 5.8)   (4) letrozole started 08/31/2014;   (a) palbociclib added Sept 2016 at 75 mg 21/7, with significant neutropenia; not repeated after first cycle  (b) letrozole discontinued 05/29/2017 with evidence of disease progression in the breast   (5)  zolendronate started 09/20/2014, stopped after initial dose due to poor tolerance  (a) denosumab/ Xgeva started 01/31/2015, repeated every 4 weeks  (b) changed to every 8 weeks as of August 2018.   (c) every 12 weeks recommended and ordered to start  01/2020   (6) on 10/20/2014 the patient underwent T2-T3 and T4 decompressive laminectomy with removal of epidural tumor, C7-T4 segmental pedicle screw instrumentation with virage screw system with arrow guidance protocol and C7-T4 posterolateral fusion. The cells were positive for the estrogen receptor. HER-2/neu testing by [facility] showed again equivocal results,  (a) most recent bone scan 10/01/2016 finds no new lesions only postoperative changes  (7) radiation 11/23/2014-12/11/2014.  (a) T1-T5 was treated to 30 Gy in 12 fractions at 2.5 Gy per fraction   (8) initiated close follow-up while considering eventual left lumpectomy or mastectomy depending on  longer-term results of systemic therapy  (a) most recent left breast ultrasonography 10/30/2016 found this mass to measure 0.7 cm  (b) repeat left breast ultrasonography 05/13/2017  showed the upper outer quadrant mass to have grown to 1.4 cm  (c) left breast ultrasound 09/08/2017 shows the mass to now measure 0.9 cm  (d) left breast biopsy x2 on 12/11/2017 shows high-grade ductal carcinoma in situ, essentially estrogen receptor negative  (9) genetics testing using the Breast/Ovarian Cancer Panel through [facility] [name], MD) found no deleterious mutations in ATM, BARD1, BRCA1, BRCA2, BRIP1, CDH1, CHEK2, EPCAM, FANCC, MLH1, MSH2, MSH6, NBN, PALB2, PMS2, PTEN, RAD51C, RAD51D, STK11, TP53, or XRCC2    (10) right thyroid nodule is a complex cyst as noted on CT scan of the neck 01/29/2016  (11) osteoporosis: [facility] density at [facility] 09/12/2016 showed a T score of -4.8.  (a) switched to Prolia starting 03/29/2020  (12) fulvestrant started 05/29/2017, last dose 07/22/2018 (discontinued due to pandemic).  (a) started palbociclib 06/29/2017 at 75 mg every other day for 21 days on, 7 days off  (b) palbociclib discontinued on 3/29 due to progressive fatigue and patient preference  (13)  was to start abemaciclib 02/04/2018, but patient opted for going back to palbociclib at a lower dose beginning in November 19, patient subsequently declined s palbociclib, and  proceeded to surgery  (14) status post left lumpectomy 05/03/2018 showing a pT1b pNX invasive ductal carcinoma, grade 2, with equivocal HER-2 results  (a) opted against adjuvant radiation given presence of stage IV disease  (15) tamoxifen supposed to have been started started 09/13/2018 as a "bridge" pending resumption of fulvestrant/denosumab, but never started by the patient  (16) PET scan 11/02/2018 showed no active disease  (a) [place] scan on 10/31/2019 shows activity in 2 liver lesions, no active bone or lung lesions  (b) abdominal ultrasound 11/08/2019 confirms 2 liver lesions measuring 4.4 and 2.1 cm.  (c) biopsy of 1 of the liver lesions 12/12/2019 confirmed metastatic breast cancer, estrogen and progesterone  receptor positive, with an MIB-1 of 10%, and HER-2 positive, with a copy number of 2.12 and the number per cell 6.25  (d) abdominal ultrasound 03/12/2020 shows one of the liver lesions to have increased, while the second lesion has decreased  (17) letrozole started 02/15/2019, discontinued 12/02/2019 with evidence of progression  (a) bone density 09/12/2016 showed a T score of -4.8.  (18) fulvestrant resumed 12/08/2019  (a) palbociclib added at 75 mg daily 21/7 starting 12/20/2019   (b) fulvestrant and palbociclib discontinued September 2022 with evidence of progression.  (19) trastuzumab started 04/25/2020, repeat every 28 days  (a) echo 03/12/2020 shows an ejection fraction in the 60-65% range  (b) changed to subQ formulation of trastuzumab 06/20/2020  (c) trastuzumab discontinued September 2022 with MRI of the abdomen showing evidence of progression  (20) starting Enhertu on 02/07/2021  (A) echo 11/21/2020 showed an ejection fraction in the  60-65% range  (B) Enhertu changed to every 4 weeks after the second dose to allow for additional recovery time  (C) treatment held after the third dose with significant drop in the patient's functional status  (D) restaging abdominal MRI 05/14/2021  (21) lower extremity Doppler ultrasound 04/25/2021 found acute DVT involving the right femoral vein and other proximal veins.  The left side was benign  (A) rivaroxaban started 04/25/2021   PLAN: April Rosales is making a good recovery from her 3 doses of Enhertu.  Her hair is coming out and she says she is not looking at herself in the mirror.  She is not planning to get a wig.  Otherwise her functional status is pretty much back to baseline  She is tolerating the rivaroxaban well and likely this will be continued lifelong.  She is still having swelling of the right ankle and a little tenderness there  She is already scheduled for repeat liver MRI and she will see [place] again before the end of the month to  assess her response to Enhertu and consider how to go forward with her treatments.  April Dad. Kynzli Rease, MD 05/07/21 2:17 PM Medical Oncology and Hematology [facility] [place], [place] [address] Tel. [phone]    Fax. [phone]   I, Wilburn Mylar, am acting as scribe for Dr. [name]. Shawn Carattini.  I, Lurline Del MD, have reviewed the above documentation for accuracy and completeness, and I agree with the above.    *Total Encounter Time as defined by the Centers for Medicare and Medicaid Services includes, in addition to the face-to-face time of a patient visit (documented in the note above) non-face-to-face time: obtaining and reviewing outside history, ordering and reviewing medications, tests or procedures, care coordination (communications with other health care professionals or caregivers) and documentation in the medical record.

## 2021-05-07 ENCOUNTER — Ambulatory Visit (HOSPITAL_BASED_OUTPATIENT_CLINIC_OR_DEPARTMENT_OTHER): Payer: Medicare Other | Admitting: Oncology

## 2021-05-07 DIAGNOSIS — C787 Secondary malignant neoplasm of liver and intrahepatic bile duct: Secondary | ICD-10-CM | POA: Insufficient documentation

## 2021-05-07 DIAGNOSIS — C50919 Malignant neoplasm of unspecified site of unspecified female breast: Secondary | ICD-10-CM

## 2021-05-07 DIAGNOSIS — C50912 Malignant neoplasm of unspecified site of left female breast: Secondary | ICD-10-CM | POA: Insufficient documentation

## 2021-05-07 DIAGNOSIS — C7951 Secondary malignant neoplasm of bone: Secondary | ICD-10-CM | POA: Insufficient documentation

## 2021-05-07 DIAGNOSIS — Z87891 Personal history of nicotine dependence: Secondary | ICD-10-CM | POA: Insufficient documentation

## 2021-05-07 DIAGNOSIS — Z7901 Long term (current) use of anticoagulants: Secondary | ICD-10-CM | POA: Diagnosis not present

## 2021-05-07 DIAGNOSIS — I82401 Acute embolism and thrombosis of unspecified deep veins of right lower extremity: Secondary | ICD-10-CM | POA: Diagnosis not present

## 2021-05-07 DIAGNOSIS — Z170 Estrogen receptor positive status [ER+]: Secondary | ICD-10-CM | POA: Diagnosis not present

## 2021-05-07 DIAGNOSIS — Z5112 Encounter for antineoplastic immunotherapy: Secondary | ICD-10-CM | POA: Insufficient documentation

## 2021-05-07 DIAGNOSIS — C50412 Malignant neoplasm of upper-outer quadrant of left female breast: Secondary | ICD-10-CM | POA: Diagnosis not present

## 2021-05-07 DIAGNOSIS — M810 Age-related osteoporosis without current pathological fracture: Secondary | ICD-10-CM | POA: Diagnosis not present

## 2021-05-14 ENCOUNTER — Ambulatory Visit (HOSPITAL_COMMUNITY)
Admission: RE | Admit: 2021-05-14 | Discharge: 2021-05-14 | Disposition: A | Discharge status: Home / Self Care | Payer: Medicare Other | Source: Ambulatory Visit | Attending: Oncology | Admitting: Oncology

## 2021-05-14 ENCOUNTER — Other Ambulatory Visit: Payer: Self-pay

## 2021-05-14 DIAGNOSIS — Z170 Estrogen receptor positive status [ER+]: Secondary | ICD-10-CM

## 2021-05-14 DIAGNOSIS — Q676 Pectus excavatum: Secondary | ICD-10-CM | POA: Diagnosis not present

## 2021-05-14 DIAGNOSIS — K8020 Calculus of gallbladder without cholecystitis without obstruction: Secondary | ICD-10-CM | POA: Diagnosis not present

## 2021-05-14 DIAGNOSIS — C50919 Malignant neoplasm of unspecified site of unspecified female breast: Secondary | ICD-10-CM

## 2021-05-14 DIAGNOSIS — K5730 Diverticulosis of large intestine without perforation or abscess without bleeding: Secondary | ICD-10-CM | POA: Diagnosis not present

## 2021-05-14 DIAGNOSIS — C7951 Secondary malignant neoplasm of bone: Secondary | ICD-10-CM

## 2021-05-14 DIAGNOSIS — C50412 Malignant neoplasm of upper-outer quadrant of left female breast: Secondary | ICD-10-CM | POA: Diagnosis not present

## 2021-05-14 DIAGNOSIS — M419 Scoliosis, unspecified: Secondary | ICD-10-CM | POA: Diagnosis not present

## 2021-05-14 MED ORDER — GADOBUTROL 1 MMOL/ML IV SOLN
5.0000 mL | Freq: Once | INTRAVENOUS | Status: AC | PRN
  Administered 2021-05-14: 18:00:00 5 mL via INTRAVENOUS

## 2021-05-21 MED FILL — Dexamethasone Sodium Phosphate Inj 100 MG/10ML: INTRAMUSCULAR | Qty: 1 | Status: AC

## 2021-05-21 NOTE — Progress Notes (Signed)
April Rosales  [phone] [phone]    ID: April Rosales DOB: 31-Aug-1940  MR#: [id number]  ZTI#:[id number]  Patient Care Team: [name], MD as PCP - General (Internal Medicine) Magrinat, [name], MD as Consulting Physician (Oncology) [name], MD as Consulting Physician (Orthopedic Surgery) [name], MD as Consulting Physician (General Surgery) [name], MD as Consulting Physician (Cardiology) [name], RPH-CPP (Pharmacist) OTHER: [name], DDS   CHIEF COMPLAINT: Stage IV estrogen receptor positive breast cancer  CURRENT TREATMENT: Enhertu; denosumab/Prolia every 6 months;   INTERVAL HISTORY: April Rosales here for follow-up after her most recent MRI.  She has not received Enhertu in the past 8 weeks given poor tolerance.  Her most recent MRI results were called to her and she understands that she had good response with the Enhertu and would like to continue it if possible.  Her most recent echocardiogram on 03/05/2021 showed stability with an ejection fraction of 60-65%.  She also receives she also receives Prolia every 6 months for osteoporosis, last dose 02/28/2021 Today she has few adverse effects written on a piece of paper which she wanted to run by [place].  She mentions that she feels very tired after the Enhertu treatment for the first 1 week when she cannot taste food very well, has no appetite and she has lost some weight.  She also notices that she is overall very tired, has noticed some leg swelling, right lower extremity more prominent than left lower extremity and some ankle pain in the right side.  She has noticed hair loss.  She understands that most of the side effects are related to treatment. However given the response noted on the MRI, she is very willing to continue this treatment.  Dizziness has continued to improve since her last visit, she mostly notices this when she stands up very quickly. Rest  of the pertinent 10 point ROS reviewed and negative.   REVIEW OF SYSTEMS:    COVID 19 VACCINATION STATUS: [place] x2, most recently 11/2019, no boosters as of September 2022   BREAST CANCER HISTORY: From the original intake note:   April Rosales underwent right lumpectomy in 1985 at [facility] for what sounds like a stage I breast cancer. She tells me she had more than 30 lymph nodes removed from her right axilla and all of them were clear. She received  adjuvant radiation but no systemic treatment.  The patient had recently refused mammography with the last mammogram I can find dating back to August 2010.  More recently the patient presented with left scapular pain radiating down the left arm.  She was evaluated by Dr. [name], who obtained a chest x-ray showing a possible abnormality at T3. He then set up the patient for an MRI of the thoracic spine performed 08/16/2014.  This showed multiple compression fractures  (a similar picture had been noted on lumbar MRI 10/09/2011 ). However at T3 they noted tumor in the vertebral body extending into the left pedicle and into the lateral epidural space, displacing the cord to the right. There was no evidence of cord compression or cord signal abnormality. There were no other areas of tumor identified in the thoracic spine.         The patient was then referred to Dr. Hal Neer who on 08/24/2014 set April Rosales up for CT scans of the chest, abdomen and pelvis. There was a dense mass in the upper inner quadrant of the left breast measuring  1.6 cm. There was a 1.2 cm nodule in the minor fissure of the right lung and some evidence of right lung fibrosis at the site of the prior radiation port.  There was also a thyroid mass measuring 2 cm. However there were no parenchymal lung or liver lesions. Incidental meningoceles were noted as well as sclerosis of the fifth and sixth ribs which were felt to be likely posttraumatic.   On 08/29/2014 the patient underwent  bilateral diagnostic mammography with tomosynthesis and left breast ultrasonography.  There were postsurgical changes in the upper right breast. In the left breast there was an irregular mass measuring 2.3 cm in the upper inner quadrant. This was palpable. Ultrasound showed this to be hypoechoic and to measure 2.0 cm. There were adjacent areas of nodularity. There was no definite lymphadenopathy in the left axilla.  Biopsy of this breast mass 08/29/2014 showed an invasive adenocarcinoma with both ductal and lobular features (there was strong diffuse E-cadherin expression as well as areas with total absence of E-cadherin expression), with the preliminary prognostic profile showing strong estrogen positivity, very weak to near absent progesterone positivity, an MIB-1 of approximately 40%, and HER-2 equivocal  The patient's subsequent history is as detailed below.   PAST MEDICAL HISTORY: Past Medical History:  Diagnosis Date   Arthritis    Breast cancer ([place]) 1985/2016   takes [name] daily   Chronic back pain    stenosis/listhesis   Family history of ovarian cancer    Osteoporosis    takes Vit D   Radiation 11/23/14-12/11/14   30 Gy T1-T5     PAST SURGICAL HISTORY: Past Surgical History:  Procedure Laterality Date   ABDOMINAL HYSTERECTOMY     1980   APPENDECTOMY  [id number]   APPLICATION OF INTRAOPERATIVE CT SCAN N/A 10/20/2014   Procedure: APPLICATION OF INTRAOPERATIVE CAT SCAN;  Surgeon: [name], MD;  Location: [facility];  Service: Neurosurgery;  Laterality: N/A;   BREAST LUMPECTOMY WITH RADIOACTIVE SEED LOCALIZATION Left 05/03/2018   Procedure: LEFT BREAST LUMPECTOMY WITH BRACKETED RADIOACTIVE SEED LOCALIZATION;  Surgeon: [name], MD;  Location: [place];  Service: General;  Laterality: Left;   BREAST SURGERY Right 1985   THYROID CYST EXCISION  1967    FAMILY HISTORY Family History  Problem Relation Age of Onset   Heart disease Mother    Hypertension  Mother    Heart disease Father    Diabetes Father    Breast cancer Paternal Aunt        4 paternal aunts with breast cancer over 29   Prostate cancer Paternal Uncle    Stroke Paternal Grandfather    Ovarian cancer Paternal Aunt    Huntington's disease Other        Nephew, inherited from his father  The patient's father died from a heart attack at the age of 48. He had 9 sisters. 3 of those sisters had breast cancer, all in a menopausal setting. Another sister had ovarian cancer. One of the paternal uncles had cancer of the colon "and back".  The patient's mother died at the age of 62. She was found to have breast cancer shortly before dying , during her final hospitalization.   GYNECOLOGIC HISTORY:  No LMP recorded. Patient has had a hysterectomy.  Menarche age 54, first live birth age 17, the patient is GX P1. She underwent hysterectomy in 1980. She thinks the ovaries were removed, but the CT scan obtained 08/24/2014 showed a definite right ovary. The left  ovary may have been removed. She did not take hormone replacement after the hysterectomy.   SOCIAL HISTORY:   Janda worked in Psychiatric nurse. She is divorced, lives alone, with 2 cats. Her son April Rosales lives in [place] where he works in Engineer, technical sales. He has 4 children of his own. The patient is a Psychologist, forensic.   ADVANCED DIRECTIVES:  In place. The patient has named her sister Pleas Koch [phone]) and her friend Janina Mayo [phone]) as joint healthcare powers of attorney   HEALTH MAINTENANCE: Social History   Tobacco Use   Smoking status: Former    Packs/day: 0.50    Years: 10.00    Pack years: 5.00    Types: Cigarettes    Quit date: 05/03/1970    Years since quitting: 51.0   Smokeless tobacco: Former   Tobacco comments:    quit smoking 1970's  Substance Use Topics   Alcohol use: Yes    Alcohol/week: 0.0 standard drinks    Comment: Rare   Drug use: No     Colonoscopy: never  PAP: status post  hysterectomy  Bone density: 09/12/2016 April Rosales/ T score of -4.8 osteoporosis  Lipid panel:  Allergies  Allergen Reactions   Ativan [Lorazepam]     Made her crazy   Dilaudid [Hydromorphone Hcl]     Doesn't want   Haldol [Haloperidol Lactate]     Made her crazy    Current Outpatient Medications  Medication Sig Dispense Refill   Cholecalciferol (VITAMIN D3 ADULT GUMMIES PO) Take 2,000 Units by mouth daily.      Cyanocobalamin (VITAMIN B-12 PO) Take by mouth daily. Takes every other day     dexamethasone (DECADRON) 4 MG tablet Take 2 tablets (8 mg total) by mouth daily. Start the day after chemotherapy for 2 days. 30 tablet 1   diazepam (VALIUM) 5 MG tablet Take one tablet by mouth just before MRI; may repeat once 10 tablet 0   Iron-Vitamins (GERITOL PO) Take by mouth daily.     OVER THE COUNTER MEDICATION OTC Apple Cider Vinegar 1 capsule daily.     prochlorperazine (COMPAZINE) 10 MG tablet Take 1 tablet (10 mg total) by mouth every 6 (six) hours as needed (Nausea or vomiting). 30 tablet 1   RIVAROXABAN (XARELTO) VTE STARTER PACK (15 & 20 MG) Follow package directions: Take one 79m tablet by mouth twice a day. On day 22, switch to one 266mtablet once a day. Take with food. 51 each 0   No current facility-administered medications for this visit.    OBJECTIVE:   Vitals:   05/22/21 1105  BP: (!) 152/77  Pulse: 99  Resp: 18  Temp: 97.7 F (36.5 C)  SpO2: 100%     Wt Readings from Last 3 Encounters:  05/22/21 108 lb 6 oz (49.2 kg)  04/25/21 107 lb 4.8 oz (48.7 kg)  03/28/21 111 lb 8 oz (50.6 kg)   Body mass index is 20.48 kg/m.    ECOG FS:1 - Symptomatic but completely ambulatory  Physical Exam Constitutional:      Comments: Frail-appearing  HENT:     Head: Normocephalic and atraumatic.  Cardiovascular:     Rate and Rhythm: Normal rate and regular rhythm.  Pulmonary:     Effort: Pulmonary effort is normal.     Breath sounds: Normal breath sounds.  Abdominal:      General: Abdomen is flat.     Palpations: Abdomen is soft.  Musculoskeletal:        General: Swelling (Right  lower extremity swelling greater than left lower extremity) and tenderness (Right ankle tenderness) present.     Cervical back: Normal range of motion and neck supple. No rigidity.  Lymphadenopathy:     Cervical: No cervical adenopathy.  Skin:    General: Skin is warm.  Neurological:     General: No focal deficit present.     Mental Status: She is alert.  Psychiatric:        Mood and Affect: Mood normal.     LAB RESULTS:  CMP     Component Value Date/Time   NA 142 05/22/2021 1038   NA 141 05/29/2017 1417   K 3.7 05/22/2021 1038   K 3.6 05/29/2017 1417   CL 108 05/22/2021 1038   CO2 28 05/22/2021 1038   CO2 27 05/29/2017 1417   GLUCOSE 110 (H) 05/22/2021 1038   GLUCOSE 111 05/29/2017 1417   BUN 7 (L) 05/22/2021 1038   BUN 17.0 05/29/2017 1417   CREATININE 0.57 05/22/2021 1038   CREATININE 0.66 03/05/2020 1218   CREATININE 0.8 05/29/2017 1417   CALCIUM 9.7 05/22/2021 1038   CALCIUM 10.7 (H) 06/26/2017 1216   CALCIUM 10.4 05/29/2017 1417   PROT 6.5 05/22/2021 1038   PROT 7.3 05/29/2017 1417   ALBUMIN 3.6 05/22/2021 1038   ALBUMIN 3.9 05/29/2017 1417   AST 22 05/22/2021 1038   AST 34 12/02/2019 1147   AST 17 05/29/2017 1417   ALT 13 05/22/2021 1038   ALT 30 12/02/2019 1147   ALT 15 05/29/2017 1417   ALKPHOS 39 05/22/2021 1038   ALKPHOS 42 05/29/2017 1417   BILITOT 0.5 05/22/2021 1038   BILITOT 0.4 12/02/2019 1147   BILITOT 0.30 05/29/2017 1417   GFRNONAA >60 05/22/2021 1038   GFRNONAA 83 03/05/2020 1218   GFRAA 97 03/05/2020 1218    INo results found for: SPEP, UPEP  Lab Results  Component Value Date   WBC 4.6 05/22/2021   NEUTROABS 3.0 05/22/2021   HGB 14.3 05/22/2021   HCT 43.0 05/22/2021   MCV 96.4 05/22/2021   PLT 203 05/22/2021      Chemistry      Component Value Date/Time   NA 142 05/22/2021 1038   NA 141 05/29/2017 1417   K 3.7  05/22/2021 1038   K 3.6 05/29/2017 1417   CL 108 05/22/2021 1038   CO2 28 05/22/2021 1038   CO2 27 05/29/2017 1417   BUN 7 (L) 05/22/2021 1038   BUN 17.0 05/29/2017 1417   CREATININE 0.57 05/22/2021 1038   CREATININE 0.66 03/05/2020 1218   CREATININE 0.8 05/29/2017 1417      Component Value Date/Time   CALCIUM 9.7 05/22/2021 1038   CALCIUM 10.7 (H) 06/26/2017 1216   CALCIUM 10.4 05/29/2017 1417   ALKPHOS 39 05/22/2021 1038   ALKPHOS 42 05/29/2017 1417   AST 22 05/22/2021 1038   AST 34 12/02/2019 1147   AST 17 05/29/2017 1417   ALT 13 05/22/2021 1038   ALT 30 12/02/2019 1147   ALT 15 05/29/2017 1417   BILITOT 0.5 05/22/2021 1038   BILITOT 0.4 12/02/2019 1147   BILITOT 0.30 05/29/2017 1417       Lab Results  Component Value Date   LABCA2 24 01/01/2016    No components found for: [id number]  No results for input(s): INR in the last 168 hours.  Urinalysis    Component Value Date/Time   COLORURINE YELLOW 03/05/2020 [place] 03/05/2020 1218   LABSPEC 1.006 03/05/2020 1218  PHURINE 7.0 03/05/2020 [place] 03/05/2020 [place] 03/05/2020 1218   BILIRUBINUR NEGATIVE 09/19/2015 1239   [place] 03/05/2020 [place] 03/05/2020 1218   NITRITE NEGATIVE 03/05/2020 1218   LEUKOCYTESUR 2+ (A) 03/05/2020 1218    STUDIES: MR Abdomen W Wo Contrast  Result Date: 05/15/2021 CLINICAL DATA:  Metastatic breast cancer restaging and assessment of treatment response EXAM: MRI ABDOMEN WITHOUT AND WITH CONTRAST TECHNIQUE: Multiplanar multisequence MR imaging of the abdomen was performed both before and after the administration of intravenous contrast. CONTRAST:  57m GADAVIST GADOBUTROL 1 MMOL/ML IV SOLN COMPARISON:  Multiple exams, including 01/24/2021 FINDINGS: Lower chest: Chronic scarring with mild associated nodularity inferiorly in the right middle lobe, as shown on prior PET-CT from 10/28/2019. Mild cardiomegaly.  Suspected mild lingular scarring. Hepatobiliary: The scattered hepatic metastatic lesions are substantially reduced in size compared to previous. The dominant central lesion in the upper liver likely spanning the right and left hepatic lobes on image 7 of 7 measures 5.0 by 3.5 cm, formerly 5.7 by 4.3 cm. An index lesion in segment 4b of the liver measures 1.2 by 1.1 cm on image 15 of series 7, formerly 1.9 by 2.0 cm. The other scattered lesions affecting all lobes of the liver likewise appear reduced in size in a similar manner. No new liver lesions are identified. Multiple gallstones are present in the gallbladder measuring up to about 1.3 cm in diameter. No biliary dilatation observed. No pericholecystic fluid. Pancreas: Upper normal size of dorsal pancreatic duct, no pancreatic lesion identified. Spleen:  Unremarkable Adrenals/Urinary Tract: Small benign cyst of the left kidney upper pole appears stable. Adrenal glands unremarkable. Stomach/Bowel: Distal descending colon and sigmoid colon diverticulosis. Scattered diverticula elsewhere in the colon. Vascular/Lymphatic: Atherosclerosis is present, including aortoiliac atherosclerotic disease. Other:  No supplemental non-categorized findings. Musculoskeletal: Pectus excavatum. Levoconvex lumbar scoliosis stable compression fractures in the lower thoracic and upper lumbar spine. IMPRESSION: 1. Substantial reduction in size of the scattered hepatic metastatic lesions compatible with response to therapy. 2. Other imaging findings of potential clinical significance: Right middle lobe scarring. Mild cardiomegaly. Pectus excavatum. Colonic diverticulosis. Aortic Atherosclerosis (ICD10-I70.0). Levoconvex lumbar scoliosis with stable remote compression fractures in the lower thoracic and upper lumbar spine. Cholelithiasis. Electronically Signed   By: WVan ClinesM.D.   On: 05/15/2021 12:51   VAS [place]OWER EXTREMITY VENOUS (DVT)  Result Date: 04/25/2021  Lower  Venous DVT Study Patient Name:  CDUA MEHLER Date of Exam:   04/25/2021 Medical Rec #: [id number]        Accession #:    [id number] of Birth: [birth year]         Patient Gender: F Patient Age:   89years [facility]      VAS [place]OWER EXTREMITY VENOUS (DVT) Referring Phys: LMendel RyderCAUSEY --------------------------------------------------------------------------------  Indications: Swelling.  Risk Factors: Cancer. Limitations: Poor ultrasound/tissue interface. Comparison Study: No prior studies. Performing Technologist: GOliver HumRVT  Examination Guidelines: A complete evaluation includes B-mode imaging, spectral Doppler, color Doppler, and power Doppler as needed of all accessible portions of each vessel. Bilateral testing is considered an integral part of a complete examination. Limited examinations for reoccurring indications may be performed as noted. The reflux portion of the exam is performed with the patient in reverse Trendelenburg.  +---------+---------------+---------+-----------+----------+--------------+  RIGHT     Compressibility Phasicity Spontaneity Properties Thrombus Aging  +---------+---------------+---------+-----------+----------+--------------+  CFV  Full            Yes       Yes                                    +---------+---------------+---------+-----------+----------+--------------+  SFJ       Full                                                             +---------+---------------+---------+-----------+----------+--------------+  FV Prox   Partial         Yes       Yes                    Acute           +---------+---------------+---------+-----------+----------+--------------+  FV Mid    None            No        No                     Acute           +---------+---------------+---------+-----------+----------+--------------+  FV Distal None            No        No                     Acute            +---------+---------------+---------+-----------+----------+--------------+  PFV       Full                                                             +---------+---------------+---------+-----------+----------+--------------+  POP       Partial         No        No                     Acute           +---------+---------------+---------+-----------+----------+--------------+  PTV       None                                             Acute           +---------+---------------+---------+-----------+----------+--------------+  PERO      Full                                                             +---------+---------------+---------+-----------+----------+--------------+  Gastroc   Partial  Acute           +---------+---------------+---------+-----------+----------+--------------+   +----+---------------+---------+-----------+----------+--------------+  LEFT Compressibility Phasicity Spontaneity Properties Thrombus Aging  +----+---------------+---------+-----------+----------+--------------+  CFV  Full            Yes       Yes                                    +----+---------------+---------+-----------+----------+--------------+    Summary: RIGHT: - Findings consistent with acute deep vein thrombosis involving the right femoral vein, right popliteal vein, right posterior tibial veins, and right gastrocnemius veins. - No cystic structure found in the popliteal fossa.  LEFT: - No evidence of common femoral vein obstruction.  *See table(s) above for measurements and observations. Electronically signed by Monica Martinez MD on 04/25/2021 at 5:29:40 PM.    Final      ASSESSMENT: 80 y.o. April Rosales woman with stage IV left breast cancer involving bone  (1) status post right lumpectomy and axillary node dissection in 1985 followed by radiation at April Rosales 08/16/2014: measurable disease in spine, lung and left breast   (2) evaluation for left shoulder  pain led to thoracic spine MRI 08/16/2014 showing a pathologic fracture at T3 with epidural tumor displacing the cord to the right, but no cord compression. CT scans of the chest, abdomen and pelvis 08/24/2014 showed in addition a mass in the upper outer quadrant left breast measuring 1.6 cm and a nodule in the minor fissure of the right lung measuring 1.2 cm, but no parenchymal lung or liver lesions.   (a) CA 27-29 was noninformative at 38 (09/20/2014)    (3) mammography and ultrasonography 08/29/2014 show a mass in the upper inner left breast which was palpable,  measuring 2.0 cm by ultrasound. Biopsy of this mass 08/29/2014 showed an invasive breast cancer with both lobular and ductal features, estrogen receptor positive, progesterone receptor weakly positive, with an MIB-1 in the 40% range, HER-2 equivocal (6 else ratio 1.5, but average number her nucleus 5.8)   (4) letrozole started 08/31/2014;   (a) palbociclib added Sept 2016 at 75 mg 21/7, with significant neutropenia; not repeated after first cycle  (b) letrozole discontinued 05/29/2017 with evidence of disease progression in the breast   (5)  zolendronate started 09/20/2014, stopped after initial dose due to poor tolerance  (a) denosumab/ Xgeva started 01/31/2015, repeated every 4 weeks  (b) changed to every 8 weeks as of August 2018.   (c) every 12 weeks recommended and ordered to start 01/2020   (6) on 10/20/2014 the patient underwent T2-T3 and T4 decompressive laminectomy with removal of epidural tumor, C7-T4 segmental pedicle screw instrumentation with virage screw system with arrow guidance protocol and C7-T4 posterolateral fusion. The cells were positive for the estrogen receptor. HER-2/neu testing by [facility] showed again equivocal results,  (a) most recent bone scan 10/01/2016 finds no new lesions only postoperative changes  (7) radiation 11/23/2014-12/11/2014.  (a) T1-T5 was treated to 30 Gy in 12 fractions at 2.5 Gy per fraction    (8) initiated close follow-up while considering eventual left lumpectomy or mastectomy depending on  longer-term results of systemic therapy  (a) most recent left breast ultrasonography 10/30/2016 found this mass to measure 0.7 cm  (b) repeat left breast ultrasonography 05/13/2017 showed the upper outer quadrant mass to have grown to 1.4 cm  (c) left breast ultrasound 09/08/2017 shows the mass to now measure 0.9 cm  (  d) left breast biopsy x2 on 12/11/2017 shows high-grade ductal carcinoma in situ, essentially estrogen receptor negative  (9) genetics testing using the Breast/Ovarian Cancer Panel through [facility] [name], MD) found no deleterious mutations in ATM, BARD1, BRCA1, BRCA2, BRIP1, CDH1, CHEK2, EPCAM, FANCC, MLH1, MSH2, MSH6, NBN, PALB2, PMS2, PTEN, RAD51C, RAD51D, STK11, TP53, or XRCC2    (10) right thyroid nodule is a complex cyst as noted on CT scan of the neck 01/29/2016  (11) osteoporosis: [facility] density at [facility] 09/12/2016 showed a T score of -4.8.  (a) switched to Prolia starting 03/29/2020  (12) fulvestrant started 05/29/2017, last dose 07/22/2018 (discontinued due to pandemic).  (a) started palbociclib 06/29/2017 at 75 mg every other day for 21 days on, 7 days off  (b) palbociclib discontinued on 3/29 due to progressive fatigue and patient preference  (13)  was to start abemaciclib 02/04/2018, but patient opted for going back to palbociclib at a lower dose beginning in November 19, patient subsequently declined s palbociclib, and  proceeded to surgery  (14) status post left lumpectomy 05/03/2018 showing a pT1b pNX invasive ductal carcinoma, grade 2, with equivocal HER-2 results  (a) opted against adjuvant radiation given presence of stage IV disease  (15) tamoxifen supposed to have been started started 09/13/2018 as a "bridge" pending resumption of fulvestrant/denosumab, but never started by the patient  (16) PET scan 11/02/2018 showed no active disease  (a) [place]na  scan on 10/31/2019 shows activity in 2 liver lesions, no active bone or lung lesions  (b) abdominal ultrasound 11/08/2019 confirms 2 liver lesions measuring 4.4 and 2.1 cm.  (c) biopsy of 1 of the liver lesions 12/12/2019 confirmed metastatic breast cancer, estrogen and progesterone receptor positive, with an MIB-1 of 10%, and HER-2 positive, with a copy number of 2.12 and the number per cell 6.25  (d) abdominal ultrasound 03/12/2020 shows one of the liver lesions to have increased, while the second lesion has decreased  (17) letrozole started 02/15/2019, discontinued 12/02/2019 with evidence of progression  (a) bone density 09/12/2016 showed a T score of -4.8.  (18) fulvestrant resumed 12/08/2019  (a) palbociclib added at 75 mg daily 21/7 starting 12/20/2019   (b) fulvestrant and palbociclib discontinued September 2022 with evidence of progression.  (19) trastuzumab started 04/25/2020, repeat every 28 days  (a) echo 03/12/2020 shows an ejection fraction in the 60-65% range  (b) changed to subQ formulation of trastuzumab 06/20/2020  (c) trastuzumab discontinued September 2022 with MRI of the abdomen showing evidence of progression  (20) starting Enhertu on 02/07/2021  (A) echo 11/21/2020 showed an ejection fraction in the 60-65% range  (B) Enhertu changed to every 4 weeks after the second dose to allow for additional recovery time  (C) treatment held after the third dose with significant drop in the patient's functional status  (D) restaging abdominal MRI 05/14/2021  (21) lower extremity Doppler ultrasound 04/25/2021 found acute DVT involving the right femoral vein and other proximal veins.  The left side was benign  (A) rivaroxaban started 04/25/2021  22, MRI abdomen showed substantial reduction in size of the scattered hepatic metastatic lesions compatible with response to therapy other imaging findings of potential clinical significance, right middle lobe scarring, mild cardiomegaly,  pectus excavated him, colonic diverticulosis, aortic atherosclerosis, lumbar scoliosis with stable remote compression fractures, cholelithiasis.  PLAN: April Rosales is here after her most recent MRI abdomen to discuss moving forward with further doses of Enhertu.  She is quite pleased with the response noted on the MRI abdomen.  She  tells me that she did have many adverse effects from this medication however most of these adverse effects have lessened since she did not receive the treatment and she is willing to try this again.  We have discussed that we could try a dose reduction however she wanted to try the full dose at this time and reevaluate in about 3 weeks.  Her labs appear satisfactory to proceed.  If she cannot tolerate this dose, I will proceed with dose reduction for the next cycle and we will plan to repeat imaging in about 3-4 cycles.  Xarelto has been refilled according to patient's choice. She will follow-up with me in 3 weeks for lab visit, infusion visit and MD appointment.  Orthodontist appointment  I spent 45 minutes in the care of this patient including review of previous medical records, history, physical examination, discussion about imaging report, follow-up recommendations and treatment recommendations.  *Total Encounter Time as defined by the Centers for Medicare and Medicaid Services includes, in addition to the face-to-face time of a patient visit (documented in the note above) non-face-to-face time: obtaining and reviewing outside history, ordering and reviewing medications, tests or procedures, care coordination (communications with other health care professionals or caregivers) and documentation in the medical record.

## 2021-05-22 ENCOUNTER — Inpatient Hospital Stay (HOSPITAL_BASED_OUTPATIENT_CLINIC_OR_DEPARTMENT_OTHER): Payer: Medicare Other | Admitting: Hematology and Oncology

## 2021-05-22 ENCOUNTER — Encounter: Payer: Self-pay | Admitting: Hematology and Oncology

## 2021-05-22 ENCOUNTER — Inpatient Hospital Stay: Payer: Medicare Other

## 2021-05-22 ENCOUNTER — Other Ambulatory Visit: Payer: Self-pay

## 2021-05-22 VITALS — BP 152/77 | HR 99 | Temp 97.7°F | Resp 18 | Wt 108.4 lb

## 2021-05-22 DIAGNOSIS — Z5112 Encounter for antineoplastic immunotherapy: Secondary | ICD-10-CM | POA: Diagnosis not present

## 2021-05-22 DIAGNOSIS — C7951 Secondary malignant neoplasm of bone: Secondary | ICD-10-CM

## 2021-05-22 DIAGNOSIS — C50919 Malignant neoplasm of unspecified site of unspecified female breast: Secondary | ICD-10-CM

## 2021-05-22 DIAGNOSIS — I82411 Acute embolism and thrombosis of right femoral vein: Secondary | ICD-10-CM

## 2021-05-22 DIAGNOSIS — Z170 Estrogen receptor positive status [ER+]: Secondary | ICD-10-CM

## 2021-05-22 DIAGNOSIS — C50912 Malignant neoplasm of unspecified site of left female breast: Secondary | ICD-10-CM | POA: Diagnosis not present

## 2021-05-22 DIAGNOSIS — I82401 Acute embolism and thrombosis of unspecified deep veins of right lower extremity: Secondary | ICD-10-CM | POA: Diagnosis not present

## 2021-05-22 DIAGNOSIS — Z5111 Encounter for antineoplastic chemotherapy: Secondary | ICD-10-CM

## 2021-05-22 DIAGNOSIS — C787 Secondary malignant neoplasm of liver and intrahepatic bile duct: Secondary | ICD-10-CM | POA: Diagnosis not present

## 2021-05-22 LAB — COMPREHENSIVE METABOLIC PANEL
ALT: 13 U/L (ref 0–44)
AST: 22 U/L (ref 15–41)
Albumin: 3.6 g/dL (ref 3.5–5.0)
Alkaline Phosphatase: 39 U/L (ref 38–126)
Anion gap: 6 (ref 5–15)
BUN: 7 mg/dL — ABNORMAL LOW (ref 8–23)
CO2: 28 mmol/L (ref 22–32)
Calcium: 9.7 mg/dL (ref 8.9–10.3)
Chloride: 108 mmol/L (ref 98–111)
Creatinine, Ser: 0.57 mg/dL (ref 0.44–1.00)
GFR, Estimated: >60 mL/min (ref >60)
Glucose, Bld: 110 mg/dL — ABNORMAL HIGH (ref 70–99)
Potassium: 3.7 mmol/L (ref 3.5–5.1)
Sodium: 142 mmol/L (ref 135–145)
Total Bilirubin: 0.5 mg/dL (ref 0.3–1.2)
Total Protein: 6.5 g/dL (ref 6.5–8.1)

## 2021-05-22 LAB — CBC WITH DIFFERENTIAL/PLATELET
Abs Immature Granulocytes: 0.01 10*3/uL (ref 0.00–0.07)
Basophils Absolute: 0 10*3/uL (ref 0.0–0.1)
Basophils Relative: 1 %
Eosinophils Absolute: 0.2 10*3/uL (ref 0.0–0.5)
Eosinophils Relative: 3 %
HCT: 43 % (ref 36.0–46.0)
Hemoglobin: 14.3 g/dL (ref 12.0–15.0)
Immature Granulocytes: 0 %
Lymphocytes Relative: 21 %
Lymphs Abs: 1 10*3/uL (ref 0.7–4.0)
MCH: 32.1 pg (ref 26.0–34.0)
MCHC: 33.3 g/dL (ref 30.0–36.0)
MCV: 96.4 fL (ref 80.0–100.0)
Monocytes Absolute: 0.5 10*3/uL (ref 0.1–1.0)
Monocytes Relative: 10 %
Neutro Abs: 3 10*3/uL (ref 1.7–7.7)
Neutrophils Relative %: 65 %
Platelets: 203 10*3/uL (ref 150–400)
RBC: 4.46 MIL/uL (ref 3.87–5.11)
RDW: 14.2 % (ref 11.5–15.5)
WBC: 4.6 10*3/uL (ref 4.0–10.5)
nRBC: 0 % (ref 0.0–0.2)

## 2021-05-22 MED ORDER — ACETAMINOPHEN 325 MG PO TABS
650.0000 mg | ORAL_TABLET | Freq: Once | ORAL | Status: AC
  Administered 2021-05-22: 12:00:00 650 mg via ORAL
  Filled 2021-05-22: qty 2

## 2021-05-22 MED ORDER — DIPHENHYDRAMINE HCL 25 MG PO CAPS
25.0000 mg | ORAL_CAPSULE | Freq: Once | ORAL | Status: AC
  Administered 2021-05-22: 12:00:00 25 mg via ORAL
  Filled 2021-05-22: qty 1

## 2021-05-22 MED ORDER — PALONOSETRON HCL INJECTION 0.25 MG/5ML
0.2500 mg | Freq: Once | INTRAVENOUS | Status: AC
  Administered 2021-05-22: 12:00:00 0.25 mg via INTRAVENOUS
  Filled 2021-05-22: qty 5

## 2021-05-22 MED ORDER — FAM-TRASTUZUMAB DERUXTECAN-NXKI CHEMO 100 MG IV SOLR
5.4000 mg/kg | Freq: Once | INTRAVENOUS | Status: AC
  Administered 2021-05-22: 13:00:00 284 mg via INTRAVENOUS
  Filled 2021-05-22: qty 14.2

## 2021-05-22 MED ORDER — DEXTROSE 5 % IV SOLN: Freq: Once | INTRAVENOUS | Status: AC

## 2021-05-22 MED ORDER — SODIUM CHLORIDE 0.9 % IV SOLN
10.0000 mg | Freq: Once | INTRAVENOUS | Status: AC
  Administered 2021-05-22: 12:00:00 10 mg via INTRAVENOUS
  Filled 2021-05-22: qty 10

## 2021-05-22 NOTE — Patient Instructions (Signed)
[facility]   Discharge Instructions: Thank you for choosing [facility] to provide your oncology and hematology care.   If you have a lab appointment with the [facility], please go directly to the [facility] and check in at the registration area.   Wear comfortable clothing and clothing appropriate for easy access to any Portacath or PICC line.   We strive to give you quality time with your provider. You may need to reschedule your appointment if you arrive late (15 or more minutes).  Arriving late affects you and other patients whose appointments are after yours.  Also, if you miss three or more appointments without notifying the office, you may be dismissed from the clinic at the providers discretion.      For prescription refill requests, have your pharmacy contact our office and allow 72 hours for refills to be completed.    Today you received the following chemotherapy and/or immunotherapy agents: Fam-trastuzumab deruxtecan-nxki (Enhertu)       To help prevent nausea and vomiting after your treatment, we encourage you to take your nausea medication as directed.  BELOW ARE SYMPTOMS THAT SHOULD BE REPORTED IMMEDIATELY: *FEVER GREATER THAN 100.4 F (38 C) OR HIGHER *CHILLS OR SWEATING *NAUSEA AND VOMITING THAT IS NOT CONTROLLED WITH YOUR NAUSEA MEDICATION *UNUSUAL SHORTNESS OF BREATH *UNUSUAL BRUISING OR BLEEDING *URINARY PROBLEMS (pain or burning when urinating, or frequent urination) *BOWEL PROBLEMS (unusual diarrhea, constipation, pain near the anus) TENDERNESS IN MOUTH AND THROAT WITH OR WITHOUT PRESENCE OF ULCERS (sore throat, sores in mouth, or a toothache) UNUSUAL RASH, SWELLING OR PAIN  UNUSUAL VAGINAL DISCHARGE OR ITCHING   Items with * indicate a potential emergency and should be followed up as soon as possible or go to the Emergency Department if any problems should occur.  Please show the CHEMOTHERAPY ALERT CARD or  IMMUNOTHERAPY ALERT CARD at check-in to the Emergency Department and triage nurse.  Should you have questions after your visit or need to cancel or reschedule your appointment, please contact [facility]  Dept: [phone]  and follow the prompts.  Office hours are 8:00 a.m. to 4:30 p.m. Monday - Friday. Please note that voicemails left after 4:00 p.m. may not be returned until the following business day.  We are closed weekends and major holidays. You have access to a nurse at all times for urgent questions. Please call the main number to the clinic Dept: [phone] and follow the prompts.   For any non-urgent questions, you may also contact your provider using MyChart. We now offer e-Visits for anyone 31 and older to request care online for non-urgent symptoms. For details visit [email or web address].   Also download the MyChart app! Go to the app store, search "MyChart", open the app, select [facility], and log in with your MyChart username and password.  Due to Covid, a mask is required upon entering the hospital/clinic. If you do not have a mask, one will be given to you upon arrival. For doctor visits, patients may have 1 support person aged 19 or older with them. For treatment visits, patients cannot have anyone with them due to current Covid guidelines and our immunocompromised population.

## 2021-05-24 ENCOUNTER — Other Ambulatory Visit: Payer: Self-pay | Admitting: *Deleted

## 2021-05-24 MED ORDER — RIVAROXABAN 20 MG PO TABS: 20.0000 mg | ORAL_TABLET | Freq: Every day | ORAL | 3 refills | Status: DC

## 2021-06-10 MED FILL — Dexamethasone Sodium Phosphate Inj 100 MG/10ML: INTRAMUSCULAR | Qty: 1 | Status: AC

## 2021-06-11 ENCOUNTER — Inpatient Hospital Stay: Payer: Medicare Other | Attending: Oncology | Admitting: Hematology and Oncology

## 2021-06-11 ENCOUNTER — Inpatient Hospital Stay: Payer: Medicare Other

## 2021-06-11 ENCOUNTER — Encounter: Payer: Self-pay | Admitting: Hematology and Oncology

## 2021-06-11 ENCOUNTER — Other Ambulatory Visit: Payer: Self-pay

## 2021-06-11 DIAGNOSIS — C7951 Secondary malignant neoplasm of bone: Secondary | ICD-10-CM | POA: Diagnosis not present

## 2021-06-11 DIAGNOSIS — C50412 Malignant neoplasm of upper-outer quadrant of left female breast: Secondary | ICD-10-CM

## 2021-06-11 DIAGNOSIS — Z170 Estrogen receptor positive status [ER+]: Secondary | ICD-10-CM | POA: Diagnosis not present

## 2021-06-11 DIAGNOSIS — Z5112 Encounter for antineoplastic immunotherapy: Secondary | ICD-10-CM | POA: Diagnosis not present

## 2021-06-11 DIAGNOSIS — C50912 Malignant neoplasm of unspecified site of left female breast: Secondary | ICD-10-CM | POA: Insufficient documentation

## 2021-06-11 DIAGNOSIS — C50919 Malignant neoplasm of unspecified site of unspecified female breast: Secondary | ICD-10-CM

## 2021-06-11 DIAGNOSIS — C787 Secondary malignant neoplasm of liver and intrahepatic bile duct: Secondary | ICD-10-CM | POA: Insufficient documentation

## 2021-06-11 LAB — COMPREHENSIVE METABOLIC PANEL
ALT: 11 U/L (ref 0–44)
AST: 21 U/L (ref 15–41)
Albumin: 3.7 g/dL (ref 3.5–5.0)
Alkaline Phosphatase: 39 U/L (ref 38–126)
Anion gap: 6 (ref 5–15)
BUN: 8 mg/dL (ref 8–23)
CO2: 29 mmol/L (ref 22–32)
Calcium: 9.9 mg/dL (ref 8.9–10.3)
Chloride: 106 mmol/L (ref 98–111)
Creatinine, Ser: 0.56 mg/dL (ref 0.44–1.00)
GFR, Estimated: >60 mL/min (ref >60)
Glucose, Bld: 93 mg/dL (ref 70–99)
Potassium: 3.7 mmol/L (ref 3.5–5.1)
Sodium: 141 mmol/L (ref 135–145)
Total Bilirubin: 0.4 mg/dL (ref 0.3–1.2)
Total Protein: 6.6 g/dL (ref 6.5–8.1)

## 2021-06-11 LAB — CBC WITH DIFFERENTIAL/PLATELET
Abs Immature Granulocytes: 0 10*3/uL (ref 0.00–0.07)
Basophils Absolute: 0.1 10*3/uL (ref 0.0–0.1)
Basophils Relative: 3 %
Eosinophils Absolute: 0.2 10*3/uL (ref 0.0–0.5)
Eosinophils Relative: 5 %
HCT: 41.1 % (ref 36.0–46.0)
Hemoglobin: 13.9 g/dL (ref 12.0–15.0)
Immature Granulocytes: 0 %
Lymphocytes Relative: 31 %
Lymphs Abs: 1.1 10*3/uL (ref 0.7–4.0)
MCH: 32.1 pg (ref 26.0–34.0)
MCHC: 33.8 g/dL (ref 30.0–36.0)
MCV: 94.9 fL (ref 80.0–100.0)
Monocytes Absolute: 0.5 10*3/uL (ref 0.1–1.0)
Monocytes Relative: 13 %
Neutro Abs: 1.7 10*3/uL (ref 1.7–7.7)
Neutrophils Relative %: 48 %
Platelets: 289 10*3/uL (ref 150–400)
RBC: 4.33 MIL/uL (ref 3.87–5.11)
RDW: 14.6 % (ref 11.5–15.5)
WBC: 3.5 10*3/uL — ABNORMAL LOW (ref 4.0–10.5)
nRBC: 0 % (ref 0.0–0.2)

## 2021-06-11 MED ORDER — FAM-TRASTUZUMAB DERUXTECAN-NXKI CHEMO 100 MG IV SOLR
5.4000 mg/kg | Freq: Once | INTRAVENOUS | Status: AC
  Administered 2021-06-11: 284 mg via INTRAVENOUS
  Filled 2021-06-11: qty 14.2

## 2021-06-11 MED ORDER — DEXAMETHASONE 4 MG PO TABS: 8.0000 mg | ORAL_TABLET | Freq: Every day | ORAL | 1 refills | Status: DC

## 2021-06-11 MED ORDER — ACETAMINOPHEN 325 MG PO TABS
650.0000 mg | ORAL_TABLET | Freq: Once | ORAL | Status: AC
  Administered 2021-06-11: 650 mg via ORAL

## 2021-06-11 MED ORDER — DIPHENHYDRAMINE HCL 25 MG PO CAPS
25.0000 mg | ORAL_CAPSULE | Freq: Once | ORAL | Status: AC
  Administered 2021-06-11: 25 mg via ORAL

## 2021-06-11 MED ORDER — PALONOSETRON HCL INJECTION 0.25 MG/5ML
0.2500 mg | Freq: Once | INTRAVENOUS | Status: AC
  Administered 2021-06-11: 0.25 mg via INTRAVENOUS

## 2021-06-11 MED ORDER — SODIUM CHLORIDE 0.9 % IV SOLN
10.0000 mg | Freq: Once | INTRAVENOUS | Status: AC
  Administered 2021-06-11: 10 mg via INTRAVENOUS
  Filled 2021-06-11: qty 10

## 2021-06-11 MED ORDER — DEXTROSE 5 % IV SOLN: Freq: Once | INTRAVENOUS | Status: AC

## 2021-06-11 NOTE — Progress Notes (Signed)
April Rosales  [phone]) [phone] Fax:(336) [phone]    ID: April Rosales DOB: 11-25-1940  MR#: [id number]  QMG#:[id number]  Patient Care Team: [name], MD as PCP - General (Internal Medicine) Magrinat, [name], MD as Consulting Physician (Oncology) [name], MD as Consulting Physician (Orthopedic Surgery) [name], MD as Consulting Physician (General Surgery) [name], MD as Consulting Physician (Cardiology) [name], RPH-CPP (Pharmacist) OTHER: [name], DDS   CHIEF COMPLAINT: Stage IV estrogen receptor positive breast cancer  CURRENT TREATMENT: Enhertu; denosumab/Prolia every 6 months;   INTERVAL HISTORY:  Laylee here for follow-up after before her enhertu.  Her last imaging showed response in her lived disease and he she agreed to continue enhertu. She continue on X geva every 6 months. She tolerated her Enhertu well, other 1 she received on December 28.  She tells me usually after she takes Enhertu for the first 1 week she feels very fatigued, nauseated, could not eat much but these symptoms did not happen this past time.  She does have some dizziness especially when she stands up.  She continues to drink about 50 to 55 ounces of water.  She denies any diarrhea with Enhertu.  No infections.  She is willing to try it at the same dose for the current visit.  She does notice some hair loss which is related to the Enhertu.  He has also noticed some nail changes, brittle nails. Rest of the pertinent 10 point ROS reviewed and negative.  REVIEW OF SYSTEMS:    COVID 19 VACCINATION STATUS: [place] x2, most recently 11/2019, no boosters as of September 2022   BREAST CANCER HISTORY: From the original intake note:   April Rosales underwent right lumpectomy in 1985 at [facility] for what sounds like a stage I breast cancer. She tells me she had more than 30 lymph nodes removed from her right axilla and all of them were  clear. She received  adjuvant radiation but no systemic treatment.  The patient had recently refused mammography with the last mammogram I can find dating back to August 2010.  More recently the patient presented with left scapular pain radiating down the left arm.  She was evaluated by Dr. [name], who obtained a chest x-ray showing a possible abnormality at T3. He then set up the patient for an MRI of the thoracic spine performed 08/16/2014.  This showed multiple compression fractures  (a similar picture had been noted on lumbar MRI 10/09/2011 ). However at T3 they noted tumor in the vertebral body extending into the left pedicle and into the lateral epidural space, displacing the cord to the right. There was no evidence of cord compression or cord signal abnormality. There were no other areas of tumor identified in the thoracic spine.         The patient was then referred to Dr. Hal Neer who on 08/24/2014 set April Rosales up for CT scans of the chest, abdomen and pelvis. There was a dense mass in the upper inner quadrant of the left breast measuring 1.6 cm. There was a 1.2 cm nodule in the minor fissure of the right lung and some evidence of right lung fibrosis at the site of the prior radiation port.  There was also a thyroid mass measuring 2 cm. However there were no parenchymal lung or liver lesions. Incidental meningoceles were noted as well as sclerosis of the fifth and sixth ribs which were felt to be likely posttraumatic.   On  08/29/2014 the patient underwent bilateral diagnostic mammography with tomosynthesis and left breast ultrasonography.  There were postsurgical changes in the upper right breast. In the left breast there was an irregular mass measuring 2.3 cm in the upper inner quadrant. This was palpable. Ultrasound showed this to be hypoechoic and to measure 2.0 cm. There were adjacent areas of nodularity. There was no definite lymphadenopathy in the left axilla.  Biopsy of this breast mass  08/29/2014 showed an invasive adenocarcinoma with both ductal and lobular features (there was strong diffuse E-cadherin expression as well as areas with total absence of E-cadherin expression), with the preliminary prognostic profile showing strong estrogen positivity, very weak to near absent progesterone positivity, an MIB-1 of approximately 40%, and HER-2 equivocal  The patient's subsequent history is as detailed below.   PAST MEDICAL HISTORY: Past Medical History:  Diagnosis Date   Arthritis    Breast cancer ([place]) 1985/2016   takes [name] daily   Chronic back pain    stenosis/listhesis   Family history of ovarian cancer    Osteoporosis    takes Vit D   Radiation 11/23/14-12/11/14   30 Gy T1-T5     PAST SURGICAL HISTORY: Past Surgical History:  Procedure Laterality Date   ABDOMINAL HYSTERECTOMY     1980   APPENDECTOMY  8119   APPLICATION OF INTRAOPERATIVE CT SCAN N/A 10/20/2014   Procedure: APPLICATION OF INTRAOPERATIVE CAT SCAN;  Surgeon: [name], MD;  Location: [facility];  Service: Neurosurgery;  Laterality: N/A;   BREAST LUMPECTOMY WITH RADIOACTIVE SEED LOCALIZATION Left 05/03/2018   Procedure: LEFT BREAST LUMPECTOMY WITH BRACKETED RADIOACTIVE SEED LOCALIZATION;  Surgeon: [name], MD;  Location: [facility];  Service: General;  Laterality: Left;   BREAST SURGERY Right 1985   THYROID CYST EXCISION  1967    FAMILY HISTORY Family History  Problem Relation Age of Onset   Heart disease Mother    Hypertension Mother    Heart disease Father    Diabetes Father    Breast cancer Paternal Aunt        4 paternal aunts with breast cancer over 75   Prostate cancer Paternal Uncle    Stroke Paternal Grandfather    Ovarian cancer Paternal Aunt    Huntington's disease Other        Nephew, inherited from his father  The patient's father died from a heart attack at the age of 42. He had 9 sisters. 3 of those sisters had breast cancer, all in a menopausal  setting. Another sister had ovarian cancer. One of the paternal uncles had cancer of the colon "and back".  The patient's mother died at the age of 25. She was found to have breast cancer shortly before dying , during her final hospitalization.   GYNECOLOGIC HISTORY:  No LMP recorded. Patient has had a hysterectomy.  Menarche age 81, first live birth age 28, the patient is GX P1. She underwent hysterectomy in 1980. She thinks the ovaries were removed, but the CT scan obtained 08/24/2014 showed a definite right ovary. The left ovary may have been removed. She did not take hormone replacement after the hysterectomy.   SOCIAL HISTORY:   Amiley worked in Psychiatric nurse. She is divorced, lives alone, with 2 cats. Her son Bethann Berkshire lives in [place] where he works in Engineer, technical sales. He has 4 children of his own. The patient is a Psychologist, forensic.   ADVANCED DIRECTIVES:  In place. The patient has named her sister Pleas Koch [phone]) and her  friend Janina Mayo [phone]) as joint healthcare powers of attorney   HEALTH MAINTENANCE: Social History   Tobacco Use   Smoking status: Former    Packs/day: 0.50    Years: 10.00    Pack years: 5.00    Types: Cigarettes    Quit date: 05/03/1970    Years since quitting: 51.1   Smokeless tobacco: Former   Tobacco comments:    quit smoking 1970's  Substance Use Topics   Alcohol use: Yes    Alcohol/week: 0.0 standard drinks    Comment: Rare   Drug use: No     Colonoscopy: never  PAP: status post hysterectomy  Bone density: 09/12/2016 [facility]/ T score of -4.8 osteoporosis  Lipid panel:  Allergies  Allergen Reactions   Ativan [Lorazepam]     Made her crazy   Dilaudid [Hydromorphone Hcl]     Doesn't want   Haldol [Haloperidol Lactate]     Made her crazy    Current Outpatient Medications  Medication Sig Dispense Refill   Cholecalciferol (VITAMIN D3 ADULT GUMMIES PO) Take 2,000 Units by mouth daily.      Cyanocobalamin (VITAMIN B-12  PO) Take by mouth daily. Takes every other day     dexamethasone (DECADRON) 4 MG tablet Take 2 tablets (8 mg total) by mouth daily. Start the day after chemotherapy for 2 days. 30 tablet 1   Iron-Vitamins (GERITOL PO) Take by mouth daily.     OVER THE COUNTER MEDICATION OTC Apple Cider Vinegar 1 capsule daily.     prochlorperazine (COMPAZINE) 10 MG tablet Take 1 tablet (10 mg total) by mouth every 6 (six) hours as needed (Nausea or vomiting). 30 tablet 1   rivaroxaban (XARELTO) 20 MG TABS tablet Take 1 tablet (20 mg total) by mouth daily with supper. 30 tablet 3   No current facility-administered medications for this visit.    OBJECTIVE:   There were no vitals filed for this visit.    Wt Readings from Last 3 Encounters:  05/22/21 108 lb 6 oz (49.2 kg)  04/25/21 107 lb 4.8 oz (48.7 kg)  03/28/21 111 lb 8 oz (50.6 kg)   There is no height or weight on file to calculate BMI.    ECOG FS:1 - Symptomatic but completely ambulatory  Physical Exam Constitutional:      Appearance: Normal appearance.     Comments: Frail-appearing, alopecia noted  HENT:     Head: Normocephalic and atraumatic.  Cardiovascular:     Rate and Rhythm: Normal rate and regular rhythm.  Pulmonary:     Effort: Pulmonary effort is normal.     Breath sounds: Normal breath sounds.  Abdominal:     General: Abdomen is flat.     Palpations: Abdomen is soft.  Musculoskeletal:        General: Swelling (Right lower extremity swelling greater than left lower extremity, at baseline) present. No tenderness.     Cervical back: Normal range of motion and neck supple. No rigidity.  Lymphadenopathy:     Cervical: No cervical adenopathy.  Skin:    General: Skin is warm.  Neurological:     General: No focal deficit present.     Mental Status: She is alert.  Psychiatric:        Mood and Affect: Mood normal.     LAB RESULTS:  CMP     Component Value Date/Time   NA 142 05/22/2021 1038   NA 141 05/29/2017 1417   K  3.7 05/22/2021 1038  K 3.6 05/29/2017 1417   CL 108 05/22/2021 1038   CO2 28 05/22/2021 1038   CO2 27 05/29/2017 1417   GLUCOSE 110 (H) 05/22/2021 1038   GLUCOSE 111 05/29/2017 1417   BUN 7 (L) 05/22/2021 1038   BUN 17.0 05/29/2017 1417   CREATININE 0.57 05/22/2021 1038   CREATININE 0.66 03/05/2020 1218   CREATININE 0.8 05/29/2017 1417   CALCIUM 9.7 05/22/2021 1038   CALCIUM 10.7 (H) 06/26/2017 1216   CALCIUM 10.4 05/29/2017 1417   PROT 6.5 05/22/2021 1038   PROT 7.3 05/29/2017 1417   ALBUMIN 3.6 05/22/2021 1038   ALBUMIN 3.9 05/29/2017 1417   AST 22 05/22/2021 1038   AST 34 12/02/2019 1147   AST 17 05/29/2017 1417   ALT 13 05/22/2021 1038   ALT 30 12/02/2019 1147   ALT 15 05/29/2017 1417   ALKPHOS 39 05/22/2021 1038   ALKPHOS 42 05/29/2017 1417   BILITOT 0.5 05/22/2021 1038   BILITOT 0.4 12/02/2019 1147   BILITOT 0.30 05/29/2017 1417   GFRNONAA >60 05/22/2021 1038   GFRNONAA 83 03/05/2020 1218   GFRAA 97 03/05/2020 1218    INo results found for: SPEP, UPEP  Lab Results  Component Value Date   WBC 4.6 05/22/2021   NEUTROABS 3.0 05/22/2021   HGB 14.3 05/22/2021   HCT 43.0 05/22/2021   MCV 96.4 05/22/2021   PLT 203 05/22/2021      Chemistry      Component Value Date/Time   NA 142 05/22/2021 1038   NA 141 05/29/2017 1417   K 3.7 05/22/2021 1038   K 3.6 05/29/2017 1417   CL 108 05/22/2021 1038   CO2 28 05/22/2021 1038   CO2 27 05/29/2017 1417   BUN 7 (L) 05/22/2021 1038   BUN 17.0 05/29/2017 1417   CREATININE 0.57 05/22/2021 1038   CREATININE 0.66 03/05/2020 1218   CREATININE 0.8 05/29/2017 1417      Component Value Date/Time   CALCIUM 9.7 05/22/2021 1038   CALCIUM 10.7 (H) 06/26/2017 1216   CALCIUM 10.4 05/29/2017 1417   ALKPHOS 39 05/22/2021 1038   ALKPHOS 42 05/29/2017 1417   AST 22 05/22/2021 1038   AST 34 12/02/2019 1147   AST 17 05/29/2017 1417   ALT 13 05/22/2021 1038   ALT 30 12/02/2019 1147   ALT 15 05/29/2017 1417   BILITOT 0.5  05/22/2021 1038   BILITOT 0.4 12/02/2019 1147   BILITOT 0.30 05/29/2017 1417       Lab Results  Component Value Date   LABCA2 24 01/01/2016    No components found for: [id number]  No results for input(s): INR in the last 168 hours.  Urinalysis    Component Value Date/Time   COLORURINE YELLOW 03/05/2020 [place] 03/05/2020 1218   LABSPEC 1.006 03/05/2020 [place] 7.0 03/05/2020 [place] 03/05/2020 [place] 03/05/2020 [place] 09/19/2015 1239   [place] 03/05/2020 [place] 03/05/2020 1218   NITRITE NEGATIVE 03/05/2020 1218   LEUKOCYTESUR 2+ (A) 03/05/2020 1218    STUDIES: MR Abdomen W Wo Contrast  Result Date: 05/15/2021 CLINICAL DATA:  Metastatic breast cancer restaging and assessment of treatment response EXAM: MRI ABDOMEN WITHOUT AND WITH CONTRAST TECHNIQUE: Multiplanar multisequence MR imaging of the abdomen was performed both before and after the administration of intravenous contrast. CONTRAST:  14m GADAVIST GADOBUTROL 1 MMOL/ML IV SOLN COMPARISON:  Multiple exams, including 01/24/2021 FINDINGS: Lower chest:  Chronic scarring with mild associated nodularity inferiorly in the right middle lobe, as shown on prior PET-CT from 10/28/2019. Mild cardiomegaly. Suspected mild lingular scarring. Hepatobiliary: The scattered hepatic metastatic lesions are substantially reduced in size compared to previous. The dominant central lesion in the upper liver likely spanning the right and left hepatic lobes on image 7 of 7 measures 5.0 by 3.5 cm, formerly 5.7 by 4.3 cm. An index lesion in segment 4b of the liver measures 1.2 by 1.1 cm on image 15 of series 7, formerly 1.9 by 2.0 cm. The other scattered lesions affecting all lobes of the liver likewise appear reduced in size in a similar manner. No new liver lesions are identified. Multiple gallstones are present in the gallbladder measuring up to  about 1.3 cm in diameter. No biliary dilatation observed. No pericholecystic fluid. Pancreas: Upper normal size of dorsal pancreatic duct, no pancreatic lesion identified. Spleen:  Unremarkable Adrenals/Urinary Tract: Small benign cyst of the left kidney upper pole appears stable. Adrenal glands unremarkable. Stomach/Bowel: Distal descending colon and sigmoid colon diverticulosis. Scattered diverticula elsewhere in the colon. Vascular/Lymphatic: Atherosclerosis is present, including aortoiliac atherosclerotic disease. Other:  No supplemental non-categorized findings. Musculoskeletal: Pectus excavatum. Levoconvex lumbar scoliosis stable compression fractures in the lower thoracic and upper lumbar spine. IMPRESSION: 1. Substantial reduction in size of the scattered hepatic metastatic lesions compatible with response to therapy. 2. Other imaging findings of potential clinical significance: Right middle lobe scarring. Mild cardiomegaly. Pectus excavatum. Colonic diverticulosis. Aortic Atherosclerosis (ICD10-I70.0). Levoconvex lumbar scoliosis with stable remote compression fractures in the lower thoracic and upper lumbar spine. Cholelithiasis. Electronically Signed   By: Van Clines M.D.   On: 05/15/2021 12:51     ASSESSMENT: 81 y.o.  woman with stage IV left breast cancer involving bone  (1) status post right lumpectomy and axillary node dissection in 1985 followed by radiation at [facility] 08/16/2014: measurable disease in spine, lung and left breast   (2) evaluation for left shoulder pain led to thoracic spine MRI 08/16/2014 showing a pathologic fracture at T3 with epidural tumor displacing the cord to the right, but no cord compression. CT scans of the chest, abdomen and pelvis 08/24/2014 showed in addition a mass in the upper outer quadrant left breast measuring 1.6 cm and a nodule in the minor fissure of the right lung measuring 1.2 cm, but no parenchymal lung or liver  lesions.   (a) CA 27-29 was noninformative at 38 (09/20/2014)    (3) mammography and ultrasonography 08/29/2014 show a mass in the upper inner left breast which was palpable,  measuring 2.0 cm by ultrasound. Biopsy of this mass 08/29/2014 showed an invasive breast cancer with both lobular and ductal features, estrogen receptor positive, progesterone receptor weakly positive, with an MIB-1 in the 40% range, HER-2 equivocal (6 else ratio 1.5, but average number her nucleus 5.8)   (4) letrozole started 08/31/2014;   (a) palbociclib added Sept 2016 at 75 mg 21/7, with significant neutropenia; not repeated after first cycle  (b) letrozole discontinued 05/29/2017 with evidence of disease progression in the breast   (5)  zolendronate started 09/20/2014, stopped after initial dose due to poor tolerance  (a) denosumab/ Xgeva started 01/31/2015, repeated every 4 weeks  (b) changed to every 8 weeks as of August 2018.   (c) every 12 weeks recommended and ordered to start 01/2020   (6) on 10/20/2014 the patient underwent T2-T3 and T4 decompressive laminectomy with removal of epidural tumor, C7-T4 segmental pedicle screw instrumentation  with virage screw system with arrow guidance protocol and C7-T4 posterolateral fusion. The cells were positive for the estrogen receptor. HER-2/neu testing by [facility] showed again equivocal results,  (a) most recent bone scan 10/01/2016 finds no new lesions only postoperative changes  (7) radiation 11/23/2014-12/11/2014.  (a) T1-T5 was treated to 30 Gy in 12 fractions at 2.5 Gy per fraction   (8) initiated close follow-up while considering eventual left lumpectomy or mastectomy depending on  longer-term results of systemic therapy  (a) most recent left breast ultrasonography 10/30/2016 found this mass to measure 0.7 cm  (b) repeat left breast ultrasonography 05/13/2017 showed the upper outer quadrant mass to have grown to 1.4 cm  (c) left breast ultrasound 09/08/2017 shows the  mass to now measure 0.9 cm  (d) left breast biopsy x2 on 12/11/2017 shows high-grade ductal carcinoma in situ, essentially estrogen receptor negative  (9) genetics testing using the Breast/Ovarian Cancer Panel through [facility] [name], MD) found no deleterious mutations in ATM, BARD1, BRCA1, BRCA2, BRIP1, CDH1, CHEK2, EPCAM, FANCC, MLH1, MSH2, MSH6, NBN, PALB2, PMS2, PTEN, RAD51C, RAD51D, STK11, TP53, or XRCC2    (10) right thyroid nodule is a complex cyst as noted on CT scan of the neck 01/29/2016  (11) osteoporosis: Bone density at [facility] 09/12/2016 showed a T score of -4.8.  (a) switched to Prolia starting 03/29/2020  (12) fulvestrant started 05/29/2017, last dose 07/22/2018 (discontinued due to pandemic).  (a) started palbociclib 06/29/2017 at 75 mg every other day for 21 days on, 7 days off  (b) palbociclib discontinued on 3/29 due to progressive fatigue and patient preference  (13)  was to start abemaciclib 02/04/2018, but patient opted for going back to palbociclib at a lower dose beginning in November 19, patient subsequently declined s palbociclib, and  proceeded to surgery  (14) status post left lumpectomy 05/03/2018 showing a pT1b pNX invasive ductal carcinoma, grade 2, with equivocal HER-2 results  (a) opted against adjuvant radiation given presence of stage IV disease  (15) tamoxifen supposed to have been started started 09/13/2018 as a "bridge" pending resumption of fulvestrant/denosumab, but never started by the patient  (16) PET scan 11/02/2018 showed no active disease  (a) [place]na scan on 10/31/2019 shows activity in 2 liver lesions, no active bone or lung lesions  (b) abdominal ultrasound 11/08/2019 confirms 2 liver lesions measuring 4.4 and 2.1 cm.  (c) biopsy of 1 of the liver lesions 12/12/2019 confirmed metastatic breast cancer, estrogen and progesterone receptor positive, with an MIB-1 of 10%, and HER-2 positive, with a copy number of 2.12 and the number per cell  6.25  (d) abdominal ultrasound 03/12/2020 shows one of the liver lesions to have increased, while the second lesion has decreased  (17) letrozole started 02/15/2019, discontinued 12/02/2019 with evidence of progression  (a) bone density 09/12/2016 showed a T score of -4.8.  (18) fulvestrant resumed 12/08/2019  (a) palbociclib added at 75 mg daily 21/7 starting 12/20/2019   (b) fulvestrant and palbociclib discontinued September 2022 with evidence of progression.  (19) trastuzumab started 04/25/2020, repeat every 28 days  (a) echo 03/12/2020 shows an ejection fraction in the 60-65% range  (b) changed to subQ formulation of trastuzumab 06/20/2020  (c) trastuzumab discontinued September 2022 with MRI of the abdomen showing evidence of progression  (20) starting Enhertu on 02/07/2021  (A) echo 11/21/2020 showed an ejection fraction in the 60-65% range  (B) Enhertu changed to every 4 weeks after the second dose to allow for additional recovery time  (C) treatment held  after the third dose with significant drop in the patient's functional status  (D) restaging abdominal MRI 05/14/2021  (21) lower extremity Doppler ultrasound 04/25/2021 found acute DVT involving the right femoral vein and other proximal veins.  The left side was benign  (A) rivaroxaban started 04/25/2021  22, MRI abdomen showed substantial reduction in size of the scattered hepatic metastatic lesions compatible with response to therapy other imaging findings of potential clinical significance, right middle lobe scarring, mild cardiomegaly, pectus excavated him, colonic diverticulosis, aortic atherosclerosis, lumbar scoliosis with stable remote compression fractures, cholelithiasis.   PLAN: During our last visit, we have discussed that we could try a dose reduction however she wanted to try the full dose at this time and reevaluate in about 3 weeks. She is here before her planned dose of Enhertu today.  She actually tolerated the  last cycle very well without any major toxicities.  She would like to continue current dose however if she experiences major toxicity, she is willing to go for the next dose reduction. No major concerns today.  Physical examination stable without any major findings. She will repeat imaging in mid February.  Alopecia, unfortunately secondary to Enhertu. Labs from today reviewed and unremarkable except for mild leukopenia, okay to proceed with treatment as planned today.  She is due for her Prolia in April 2023 Imaging ordered, to be scheduled. Continue Xarelto for acute right lower extremity DVT, no bleeding complaints today.  I spent 30 minutes in the care of this patient including review of previous medical records, history, physical examination, discussion about imaging report, follow-up recommendations and treatment recommendations.  *Total Encounter Time as defined by the Centers for Medicare and Medicaid Services includes, in addition to the face-to-face time of a patient visit (documented in the note above) non-face-to-face time: obtaining and reviewing outside history, ordering and reviewing medications, tests or procedures, care coordination (communications with other health care professionals or caregivers) and documentation in the medical record.  Benay Pike MD

## 2021-06-11 NOTE — Addendum Note (Signed)
Addended by: Adaline Sill on: 06/11/2021 12:21 PM   Modules accepted: Orders

## 2021-06-11 NOTE — Patient Instructions (Signed)
[name]  Discharge Instructions: Thank you for choosing [facility] to provide your oncology and hematology care.   If you have a lab appointment with the [facility], please go directly to the [facility] and check in at the registration area.   Wear comfortable clothing and clothing appropriate for easy access to any Portacath or PICC line.   We strive to give you quality time with your provider. You may need to reschedule your appointment if you arrive late (15 or more minutes).  Arriving late affects you and other patients whose appointments are after yours.  Also, if you miss three or more appointments without notifying the office, you may be dismissed from the clinic at the providers discretion.      For prescription refill requests, have your pharmacy contact our office and allow 72 hours for refills to be completed.    Today you received the following chemotherapy and/or immunotherapy agents  fam-trastuzumab [id number]-nxki    To help prevent nausea and vomiting after your treatment, we encourage you to take your nausea medication as directed.  BELOW ARE SYMPTOMS THAT SHOULD BE REPORTED IMMEDIATELY: *FEVER GREATER THAN 100.4 F (38 C) OR HIGHER *CHILLS OR SWEATING *NAUSEA AND VOMITING THAT IS NOT CONTROLLED WITH YOUR NAUSEA MEDICATION *UNUSUAL SHORTNESS OF BREATH *UNUSUAL BRUISING OR BLEEDING *URINARY PROBLEMS (pain or burning when urinating, or frequent urination) *BOWEL PROBLEMS (unusual diarrhea, constipation, pain near the anus) TENDERNESS IN MOUTH AND THROAT WITH OR WITHOUT PRESENCE OF ULCERS (sore throat, sores in mouth, or a toothache) UNUSUAL RASH, SWELLING OR PAIN  UNUSUAL VAGINAL DISCHARGE OR ITCHING   Items with * indicate a potential emergency and should be followed up as soon as possible or go to the Emergency Department if any problems should occur.  Please show the CHEMOTHERAPY ALERT CARD or IMMUNOTHERAPY ALERT  CARD at check-in to the Emergency Department and triage nurse.  Should you have questions after your visit or need to cancel or reschedule your appointment, please contact [facility]  Dept: [phone]  and follow the prompts.  Office hours are 8:00 a.m. to 4:30 p.m. Monday - Friday. Please note that voicemails left after 4:00 p.m. may not be returned until the following business day.  We are closed weekends and major holidays. You have access to a nurse at all times for urgent questions. Please call the main number to the clinic Dept: [phone] and follow the prompts.   For any non-urgent questions, you may also contact your provider using MyChart. We now offer e-Visits for anyone 67 and older to request care online for non-urgent symptoms. For details visit [email or web address].   Also download the MyChart app! Go to the app store, search "MyChart", open the app, select [facility], and log in with your MyChart username and password.  Due to Covid, a mask is required upon entering the hospital/clinic. If you do not have a mask, one will be given to you upon arrival. For doctor visits, patients may have 1 support person aged 106 or older with them. For treatment visits, patients cannot have anyone with them due to current Covid guidelines and our immunocompromised population.

## 2021-06-21 ENCOUNTER — Telehealth: Payer: Self-pay

## 2021-06-21 MED ORDER — APIXABAN 5 MG PO TABS: 5.0000 mg | ORAL_TABLET | Freq: Two times a day (BID) | ORAL | 1 refills | Status: DC

## 2021-06-21 NOTE — Telephone Encounter (Signed)
Pt called and reports dizziness since stopping the loading dose of xarelto. Pt states she can not function and also states she has not taken it in 4 days and since then dizziness has stopped. Per MD, we d/c xarelto and order eliquis 5 mg BID. Encouraged pt to pick up RX ASAP and educated pt on precautions for not taking her anticoagulants. Pt verbalized understanding and states she will pick up med as soon as it is ready. Pt has f/u appt 2/7 with MD.

## 2021-07-02 ENCOUNTER — Inpatient Hospital Stay: Payer: Medicare Other

## 2021-07-02 ENCOUNTER — Inpatient Hospital Stay: Payer: Medicare Other | Admitting: Hematology and Oncology

## 2021-07-09 ENCOUNTER — Other Ambulatory Visit (HOSPITAL_COMMUNITY): Payer: Medicare Other

## 2021-07-09 ENCOUNTER — Ambulatory Visit (HOSPITAL_COMMUNITY): Payer: Medicare Other

## 2021-07-09 MED FILL — Dexamethasone Sodium Phosphate Inj 100 MG/10ML: INTRAMUSCULAR | Qty: 1 | Status: AC

## 2021-07-10 ENCOUNTER — Inpatient Hospital Stay (HOSPITAL_BASED_OUTPATIENT_CLINIC_OR_DEPARTMENT_OTHER): Payer: Medicare Other | Admitting: Hematology and Oncology

## 2021-07-10 ENCOUNTER — Inpatient Hospital Stay: Payer: Medicare Other | Attending: Oncology

## 2021-07-10 ENCOUNTER — Other Ambulatory Visit: Payer: Self-pay

## 2021-07-10 ENCOUNTER — Encounter: Payer: Self-pay | Admitting: Hematology and Oncology

## 2021-07-10 ENCOUNTER — Inpatient Hospital Stay: Payer: Medicare Other

## 2021-07-10 ENCOUNTER — Other Ambulatory Visit: Payer: Self-pay | Admitting: Hematology and Oncology

## 2021-07-10 VITALS — HR 99

## 2021-07-10 VITALS — BP 153/80 | HR 110 | Temp 99.0°F | Resp 16 | Ht 61.0 in | Wt 107.0 lb

## 2021-07-10 DIAGNOSIS — C50919 Malignant neoplasm of unspecified site of unspecified female breast: Secondary | ICD-10-CM

## 2021-07-10 DIAGNOSIS — Z170 Estrogen receptor positive status [ER+]: Secondary | ICD-10-CM | POA: Insufficient documentation

## 2021-07-10 DIAGNOSIS — C7951 Secondary malignant neoplasm of bone: Secondary | ICD-10-CM

## 2021-07-10 DIAGNOSIS — C50412 Malignant neoplasm of upper-outer quadrant of left female breast: Secondary | ICD-10-CM

## 2021-07-10 DIAGNOSIS — Z5111 Encounter for antineoplastic chemotherapy: Secondary | ICD-10-CM

## 2021-07-10 DIAGNOSIS — Z5112 Encounter for antineoplastic immunotherapy: Secondary | ICD-10-CM | POA: Insufficient documentation

## 2021-07-10 DIAGNOSIS — I82411 Acute embolism and thrombosis of right femoral vein: Secondary | ICD-10-CM

## 2021-07-10 DIAGNOSIS — C787 Secondary malignant neoplasm of liver and intrahepatic bile duct: Secondary | ICD-10-CM | POA: Insufficient documentation

## 2021-07-10 DIAGNOSIS — C50912 Malignant neoplasm of unspecified site of left female breast: Secondary | ICD-10-CM | POA: Insufficient documentation

## 2021-07-10 DIAGNOSIS — Z79899 Other long term (current) drug therapy: Secondary | ICD-10-CM

## 2021-07-10 DIAGNOSIS — Z5181 Encounter for therapeutic drug level monitoring: Secondary | ICD-10-CM

## 2021-07-10 LAB — COMPREHENSIVE METABOLIC PANEL
ALT: 13 U/L (ref 0–44)
AST: 21 U/L (ref 15–41)
Albumin: 3.7 g/dL (ref 3.5–5.0)
Alkaline Phosphatase: 36 U/L — ABNORMAL LOW (ref 38–126)
Anion gap: 6 (ref 5–15)
BUN: 9 mg/dL (ref 8–23)
CO2: 29 mmol/L (ref 22–32)
Calcium: 9.6 mg/dL (ref 8.9–10.3)
Chloride: 107 mmol/L (ref 98–111)
Creatinine, Ser: 0.55 mg/dL (ref 0.44–1.00)
GFR, Estimated: >60 mL/min (ref >60)
Glucose, Bld: 117 mg/dL — ABNORMAL HIGH (ref 70–99)
Potassium: 3.8 mmol/L (ref 3.5–5.1)
Sodium: 142 mmol/L (ref 135–145)
Total Bilirubin: 0.4 mg/dL (ref 0.3–1.2)
Total Protein: 6.3 g/dL — ABNORMAL LOW (ref 6.5–8.1)

## 2021-07-10 LAB — CBC WITH DIFFERENTIAL/PLATELET
Abs Immature Granulocytes: 0.01 10*3/uL (ref 0.00–0.07)
Basophils Absolute: 0.1 10*3/uL (ref 0.0–0.1)
Basophils Relative: 1 %
Eosinophils Absolute: 0.2 10*3/uL (ref 0.0–0.5)
Eosinophils Relative: 4 %
HCT: 43 % (ref 36.0–46.0)
Hemoglobin: 14.5 g/dL (ref 12.0–15.0)
Immature Granulocytes: 0 %
Lymphocytes Relative: 25 %
Lymphs Abs: 1.3 10*3/uL (ref 0.7–4.0)
MCH: 31.8 pg (ref 26.0–34.0)
MCHC: 33.7 g/dL (ref 30.0–36.0)
MCV: 94.3 fL (ref 80.0–100.0)
Monocytes Absolute: 0.6 10*3/uL (ref 0.1–1.0)
Monocytes Relative: 11 %
Neutro Abs: 3 10*3/uL (ref 1.7–7.7)
Neutrophils Relative %: 59 %
Platelets: 221 10*3/uL (ref 150–400)
RBC: 4.56 MIL/uL (ref 3.87–5.11)
RDW: 15.2 % (ref 11.5–15.5)
WBC: 5.1 10*3/uL (ref 4.0–10.5)
nRBC: 0 % (ref 0.0–0.2)

## 2021-07-10 MED ORDER — FAM-TRASTUZUMAB DERUXTECAN-NXKI CHEMO 100 MG IV SOLR
5.4000 mg/kg | Freq: Once | INTRAVENOUS | Status: AC
  Administered 2021-07-10: 284 mg via INTRAVENOUS
  Filled 2021-07-10: qty 14.2

## 2021-07-10 MED ORDER — DIPHENHYDRAMINE HCL 25 MG PO CAPS
25.0000 mg | ORAL_CAPSULE | Freq: Once | ORAL | Status: AC
  Administered 2021-07-10: 25 mg via ORAL
  Filled 2021-07-10: qty 1

## 2021-07-10 MED ORDER — PALONOSETRON HCL INJECTION 0.25 MG/5ML
0.2500 mg | Freq: Once | INTRAVENOUS | Status: AC
  Administered 2021-07-10: 0.25 mg via INTRAVENOUS
  Filled 2021-07-10: qty 5

## 2021-07-10 MED ORDER — DEXTROSE 5 % IV SOLN: Freq: Once | INTRAVENOUS | Status: AC

## 2021-07-10 MED ORDER — ACETAMINOPHEN 325 MG PO TABS
650.0000 mg | ORAL_TABLET | Freq: Once | ORAL | Status: AC
  Administered 2021-07-10: 650 mg via ORAL
  Filled 2021-07-10: qty 2

## 2021-07-10 MED ORDER — SODIUM CHLORIDE 0.9 % IV SOLN
10.0000 mg | Freq: Once | INTRAVENOUS | Status: AC
  Administered 2021-07-10: 10 mg via INTRAVENOUS
  Filled 2021-07-10: qty 10

## 2021-07-10 NOTE — Progress Notes (Signed)
ECHO ordered. No clinical evidence of cardiac compromise, ok to proceed with Enhertu as planned today

## 2021-07-10 NOTE — Patient Instructions (Signed)
[facility]  Discharge Instructions: ?Thank you for choosing [facility] to provide your oncology and hematology care.  ? ?If you have a lab appointment with the Cancer Center, please go directly to the Cancer Center and check in at the registration area. ?  ?Wear comfortable clothing and clothing appropriate for easy access to any Portacath or PICC line.  ? ?We strive to give you quality time with your provider. You may need to reschedule your appointment if you arrive late (15 or more minutes).  Arriving late affects you and other patients whose appointments are after yours.  Also, if you miss three or more appointments without notifying the office, you may be dismissed from the clinic at the provider?s discretion.    ?  ?For prescription refill requests, have your pharmacy contact our office and allow 72 hours for refills to be completed.   ? ?Today you received the following chemotherapy and/or immunotherapy agents: Enhertu.    ?  ?To help prevent nausea and vomiting after your treatment, we encourage you to take your nausea medication as directed. ? ?BELOW ARE SYMPTOMS THAT SHOULD BE REPORTED IMMEDIATELY: ?*FEVER GREATER THAN 100.4 F (38 ?C) OR HIGHER ?*CHILLS OR SWEATING ?*NAUSEA AND VOMITING THAT IS NOT CONTROLLED WITH YOUR NAUSEA MEDICATION ?*UNUSUAL SHORTNESS OF BREATH ?*UNUSUAL BRUISING OR BLEEDING ?*URINARY PROBLEMS (pain or burning when urinating, or frequent urination) ?*BOWEL PROBLEMS (unusual diarrhea, constipation, pain near the anus) ?TENDERNESS IN MOUTH AND THROAT WITH OR WITHOUT PRESENCE OF ULCERS (sore throat, sores in mouth, or a toothache) ?UNUSUAL RASH, SWELLING OR PAIN  ?UNUSUAL VAGINAL DISCHARGE OR ITCHING  ? ?Items with * indicate a potential emergency and should be followed up as soon as possible or go to the Emergency Department if any problems should occur. ? ?Please show the CHEMOTHERAPY ALERT CARD or IMMUNOTHERAPY ALERT CARD at check-in to  the Emergency Department and triage nurse. ? ?Should you have questions after your visit or need to cancel or reschedule your appointment, please contact [facility]  Dept: [phone]  and follow the prompts.  Office hours are 8:00 a.m. to 4:30 p.m. Monday - Friday. Please note that voicemails left after 4:00 p.m. may not be returned until the following business day.  We are closed weekends and major holidays. You have access to a nurse at all times for urgent questions. Please call the main number to the clinic Dept: [phone] and follow the prompts. ? ? ?For any non-urgent questions, you may also contact your provider using MyChart. We now offer e-Visits for anyone 18 and older to request care online for non-urgent symptoms. For details visit [email or web address]. ?  ?Also download the MyChart app! Go to the app store, search "MyChart", open the app, select [facility], and log in with your MyChart username and password. ? ?Due to Covid, a mask is required upon entering the hospital/clinic. If you do not have a mask, one will be given to you upon arrival. For doctor visits, patients may have 1 support person aged 18 or older with them. For treatment visits, patients cannot have anyone with them due to current Covid guidelines and our immunocompromised population.  ? ?

## 2021-07-10 NOTE — Progress Notes (Signed)
[place]  [phone]) [phone] Fax:(336) [phone]    ID: April Rosales DOB: 22-Oct-1940  MR#: [id number]  ESP#:[id number]  Patient Care Team: [name], MD as PCP - General (Internal Medicine) Magrinat, [name], MD (Inactive) as Consulting Physician (Oncology) [name], MD as Consulting Physician (Orthopedic Surgery) [name], MD as Consulting Physician (General Surgery) [name], MD as Consulting Physician (Cardiology) [name], RPH-CPP (Pharmacist) OTHER: [name], DDS   CHIEF COMPLAINT: Stage IV estrogen receptor positive breast cancer  CURRENT TREATMENT: Enhertu; denosumab/Prolia every 6 months;   INTERVAL HISTORY:  Vernecia here for follow-up after before her enhertu.  After last visit, the first week was hard, more tired. She still can do all her ADL. No overt diarrhea, stool is soft. No fevers, has occasional chills. No change in breathing, cough, chest pain. No change in urinary habits No falls. She complains of some dizziness when she stands up. She has noticed loss of appetite for the first few days after chemotherapy. Nails have been splitting more. Rest of the pertinent 10 point ROS reviewed and negative.  REVIEW OF SYSTEMS:    COVID 19 VACCINATION STATUS: [place] x2, most recently 11/2019, no boosters as of September 2022   BREAST CANCER HISTORY: From the original intake note:   Dajanae underwent right lumpectomy in 1985 at [facility] for what sounds like a stage I breast cancer. She tells me she had more than 30 lymph nodes removed from her right axilla and all of them were clear. She received  adjuvant radiation but no systemic treatment.  The patient had recently refused mammography with the last mammogram I can find dating back to August 2010.  More recently the patient presented with left scapular pain radiating down the left arm.  She was evaluated by Dr. [name], who obtained a  chest x-ray showing a possible abnormality at T3. He then set up the patient for an MRI of the thoracic spine performed 08/16/2014.  This showed multiple compression fractures  (a similar picture had been noted on lumbar MRI 10/09/2011 ). However at T3 they noted tumor in the vertebral body extending into the left pedicle and into the lateral epidural space, displacing the cord to the right. There was no evidence of cord compression or cord signal abnormality. There were no other areas of tumor identified in the thoracic spine.         The patient was then referred to Dr. Hal Neer who on 08/24/2014 set [name] up for CT scans of the chest, abdomen and pelvis. There was a dense mass in the upper inner quadrant of the left breast measuring 1.6 cm. There was a 1.2 cm nodule in the minor fissure of the right lung and some evidence of right lung fibrosis at the site of the prior radiation port.  There was also a thyroid mass measuring 2 cm. However there were no parenchymal lung or liver lesions. Incidental meningoceles were noted as well as sclerosis of the fifth and sixth ribs which were felt to be likely posttraumatic.   On 08/29/2014 the patient underwent bilateral diagnostic mammography with tomosynthesis and left breast ultrasonography.  There were postsurgical changes in the upper right breast. In the left breast there was an irregular mass measuring 2.3 cm in the upper inner quadrant. This was palpable. Ultrasound showed this to be hypoechoic and to measure 2.0 cm. There were adjacent areas of nodularity. There was no definite lymphadenopathy in the left axilla.  Biopsy of this breast mass 08/29/2014 showed an invasive adenocarcinoma with both ductal and lobular features (there was strong diffuse E-cadherin expression as well as areas with total absence of E-cadherin expression), with the preliminary prognostic profile showing strong estrogen positivity, very weak to near absent progesterone positivity, an  MIB-1 of approximately 40%, and HER-2 equivocal  The patient's subsequent history is as detailed below.   PAST MEDICAL HISTORY: Past Medical History:  Diagnosis Date   Arthritis    Breast cancer ([place]) 1985/2016   takes [name] daily   Chronic back pain    stenosis/listhesis   Family history of ovarian cancer    Osteoporosis    takes Vit D   Radiation 11/23/14-12/11/14   30 Gy T1-T5     PAST SURGICAL HISTORY: Past Surgical History:  Procedure Laterality Date   ABDOMINAL HYSTERECTOMY     1980   APPENDECTOMY  5885   APPLICATION OF INTRAOPERATIVE CT SCAN N/A 10/20/2014   Procedure: APPLICATION OF INTRAOPERATIVE CAT SCAN;  Surgeon: [name], MD;  Location: [facility];  Service: Neurosurgery;  Laterality: N/A;   BREAST LUMPECTOMY WITH RADIOACTIVE SEED LOCALIZATION Left 05/03/2018   Procedure: LEFT BREAST LUMPECTOMY WITH BRACKETED RADIOACTIVE SEED LOCALIZATION;  Surgeon: [name], MD;  Location: [place];  Service: General;  Laterality: Left;   BREAST SURGERY Right 1985   THYROID CYST EXCISION  1967    FAMILY HISTORY Family History  Problem Relation Age of Onset   Heart disease Mother    Hypertension Mother    Heart disease Father    Diabetes Father    Breast cancer Paternal Aunt        4 paternal aunts with breast cancer over 37   Prostate cancer Paternal Uncle    Stroke Paternal Grandfather    Ovarian cancer Paternal Aunt    Huntington's disease Other        Nephew, inherited from his father  The patient's father died from a heart attack at the age of 7. He had 9 sisters. 3 of those sisters had breast cancer, all in a menopausal setting. Another sister had ovarian cancer. One of the paternal uncles had cancer of the colon "and back".  The patient's mother died at the age of 58. She was found to have breast cancer shortly before dying , during her final hospitalization.   GYNECOLOGIC HISTORY:   No LMP recorded. Patient has had a  hysterectomy.  Menarche age 63, first live birth age 41, the patient is GX P1. She underwent hysterectomy in 1980. She thinks the ovaries were removed, but the CT scan obtained 08/24/2014 showed a definite right ovary. The left ovary may have been removed. She did not take hormone replacement after the hysterectomy.   SOCIAL HISTORY:   Denali worked in Psychiatric nurse. She is divorced, lives alone, with 2 cats. Her son Bethann Berkshire lives in [place] where he works in Engineer, technical sales. He has 4 children of his own. The patient is a Psychologist, forensic.   ADVANCED DIRECTIVES:  In place. The patient has named her sister Pleas Koch [phone]) and her friend Janina Mayo [phone] as joint healthcare powers of attorney   HEALTH MAINTENANCE: Social History   Tobacco Use   Smoking status: Former    Packs/day: 0.50    Years: 10.00    Pack years: 5.00    Types: Cigarettes    Quit date: 05/03/1970    Years since quitting: 51.2   Smokeless tobacco: Former   Tobacco comments:  quit smoking 1970's  Substance Use Topics   Alcohol use: Yes    Alcohol/week: 0.0 standard drinks    Comment: Rare   Drug use: No     Colonoscopy: never  PAP: status post hysterectomy  Bone density: 09/12/2016 [facility]/ T score of -4.8 osteoporosis  Lipid panel:  Allergies  Allergen Reactions   Ativan [Lorazepam]     Made her crazy   Dilaudid [Hydromorphone Hcl]     Doesn't want   Haldol [Haloperidol Lactate]     Made her crazy   Xarelto [Rivaroxaban]     Pt reports debilitating dizziness.    Current Outpatient Medications  Medication Sig Dispense Refill   apixaban (ELIQUIS) 5 MG TABS tablet Take 1 tablet (5 mg total) by mouth 2 (two) times daily. 60 tablet 1   Cholecalciferol (VITAMIN D3 ADULT GUMMIES PO) Take 2,000 Units by mouth daily.      Cyanocobalamin (VITAMIN B-12 PO) Take by mouth daily. Takes every other day     dexamethasone (DECADRON) 4 MG tablet Take 2 tablets (8 mg total) by mouth daily.  Start the day after chemotherapy for 2 days. 30 tablet 1   Iron-Vitamins (GERITOL PO) Take by mouth daily.     OVER THE COUNTER MEDICATION OTC Apple Cider Vinegar 1 capsule daily.     prochlorperazine (COMPAZINE) 10 MG tablet Take 1 tablet (10 mg total) by mouth every 6 (six) hours as needed (Nausea or vomiting). 30 tablet 1   No current facility-administered medications for this visit.    OBJECTIVE:   Vitals:   07/10/21 1329  BP: (!) 153/80  Pulse: (!) 110  Resp: 16  Temp: 99 F (37.2 C)  SpO2: 98%     Wt Readings from Last 3 Encounters:  07/10/21 107 lb (48.5 kg)  06/11/21 107 lb 4 oz (48.6 kg)  05/22/21 108 lb 6 oz (49.2 kg)   Body mass index is 20.22 kg/m.    ECOG FS:1 - Symptomatic but completely ambulatory  Physical Exam Constitutional:      Appearance: Normal appearance.     Comments: Frail-appearing, alopecia noted  HENT:     Head: Normocephalic and atraumatic.  Cardiovascular:     Rate and Rhythm: Normal rate and regular rhythm.  Pulmonary:     Effort: Pulmonary effort is normal.     Breath sounds: Normal breath sounds.  Abdominal:     General: Abdomen is flat.     Palpations: Abdomen is soft.  Musculoskeletal:        General: Swelling (Right lower extremity swelling greater than left lower extremity, at baseline, no change, wearing compression socks) present. No tenderness.     Cervical back: Normal range of motion and neck supple. No rigidity.  Lymphadenopathy:     Cervical: No cervical adenopathy.  Skin:    General: Skin is warm.  Neurological:     General: No focal deficit present.     Mental Status: She is alert.  Psychiatric:        Mood and Affect: Mood normal.     LAB RESULTS:  CMP     Component Value Date/Time   NA 142 07/10/2021 1312   NA 141 05/29/2017 1417   K 3.8 07/10/2021 1312   K 3.6 05/29/2017 1417   CL 107 07/10/2021 1312   CO2 29 07/10/2021 1312   CO2 27 05/29/2017 1417   GLUCOSE 117 (H) 07/10/2021 1312   GLUCOSE 111  05/29/2017 1417   BUN 9 07/10/2021 1312  BUN 17.0 05/29/2017 1417   CREATININE 0.55 07/10/2021 1312   CREATININE 0.66 03/05/2020 1218   CREATININE 0.8 05/29/2017 1417   CALCIUM 9.6 07/10/2021 1312   CALCIUM 10.7 (H) 06/26/2017 1216   CALCIUM 10.4 05/29/2017 1417   PROT 6.3 (L) 07/10/2021 1312   PROT 7.3 05/29/2017 1417   ALBUMIN 3.7 07/10/2021 1312   ALBUMIN 3.9 05/29/2017 1417   AST 21 07/10/2021 1312   AST 34 12/02/2019 1147   AST 17 05/29/2017 1417   ALT 13 07/10/2021 1312   ALT 30 12/02/2019 1147   ALT 15 05/29/2017 1417   ALKPHOS 36 (L) 07/10/2021 1312   ALKPHOS 42 05/29/2017 1417   BILITOT 0.4 07/10/2021 1312   BILITOT 0.4 12/02/2019 1147   BILITOT 0.30 05/29/2017 1417   GFRNONAA >60 07/10/2021 1312   GFRNONAA 83 03/05/2020 1218   GFRAA 97 03/05/2020 1218    INo results found for: SPEP, UPEP  Lab Results  Component Value Date   WBC 5.1 07/10/2021   NEUTROABS 3.0 07/10/2021   HGB 14.5 07/10/2021   HCT 43.0 07/10/2021   MCV 94.3 07/10/2021   PLT 221 07/10/2021      Chemistry      Component Value Date/Time   NA 142 07/10/2021 1312   NA 141 05/29/2017 1417   K 3.8 07/10/2021 1312   K 3.6 05/29/2017 1417   CL 107 07/10/2021 1312   CO2 29 07/10/2021 1312   CO2 27 05/29/2017 1417   BUN 9 07/10/2021 1312   BUN 17.0 05/29/2017 1417   CREATININE 0.55 07/10/2021 1312   CREATININE 0.66 03/05/2020 1218   CREATININE 0.8 05/29/2017 1417      Component Value Date/Time   CALCIUM 9.6 07/10/2021 1312   CALCIUM 10.7 (H) 06/26/2017 1216   CALCIUM 10.4 05/29/2017 1417   ALKPHOS 36 (L) 07/10/2021 1312   ALKPHOS 42 05/29/2017 1417   AST 21 07/10/2021 1312   AST 34 12/02/2019 1147   AST 17 05/29/2017 1417   ALT 13 07/10/2021 1312   ALT 30 12/02/2019 1147   ALT 15 05/29/2017 1417   BILITOT 0.4 07/10/2021 1312   BILITOT 0.4 12/02/2019 1147   BILITOT 0.30 05/29/2017 1417       Lab Results  Component Value Date   LABCA2 24 01/01/2016    No components found  for: [id number]  No results for input(s): INR in the last 168 hours.  Urinalysis    Component Value Date/Time   COLORURINE YELLOW 03/05/2020 [place] 03/05/2020 1218   LABSPEC 1.006 03/05/2020 [place] 7.0 03/05/2020 [place] 03/05/2020 [place] 03/05/2020 [place] 09/19/2015 [place] 03/05/2020 [place] 03/05/2020 1218   NITRITE NEGATIVE 03/05/2020 1218   LEUKOCYTESUR 2+ (A) 03/05/2020 1218    STUDIES: No results found.   ASSESSMENT: 81 y.o. [place] woman with stage IV left breast cancer involving bone  (1) status post right lumpectomy and axillary node dissection in 1985 followed by radiation at [facility] [place] 08/16/2014: measurable disease in spine, lung and left breast   (2) evaluation for left shoulder pain led to thoracic spine MRI 08/16/2014 showing a pathologic fracture at T3 with epidural tumor displacing the cord to the right, but no cord compression. CT scans of the chest, abdomen and pelvis 08/24/2014 showed in addition a mass in the upper outer quadrant left breast measuring 1.6 cm and a nodule  in the minor fissure of the right lung measuring 1.2 cm, but no parenchymal lung or liver lesions.   (a) CA 27-29 was noninformative at 38 (09/20/2014)    (3) mammography and ultrasonography 08/29/2014 show a mass in the upper inner left breast which was palpable,  measuring 2.0 cm by ultrasound. Biopsy of this mass 08/29/2014 showed an invasive breast cancer with both lobular and ductal features, estrogen receptor positive, progesterone receptor weakly positive, with an MIB-1 in the 40% range, HER-2 equivocal (6 else ratio 1.5, but average number her nucleus 5.8)   (4) letrozole started 08/31/2014;   (a) palbociclib added Sept 2016 at 75 mg 21/7, with significant neutropenia; not repeated after first cycle  (b) letrozole discontinued 05/29/2017 with evidence of  disease progression in the breast   (5)  zolendronate started 09/20/2014, stopped after initial dose due to poor tolerance  (a) denosumab/ Xgeva started 01/31/2015, repeated every 4 weeks  (b) changed to every 8 weeks as of August 2018.   (c) every 12 weeks recommended and ordered to start 01/2020   (6) on 10/20/2014 the patient underwent T2-T3 and T4 decompressive laminectomy with removal of epidural tumor, C7-T4 segmental pedicle screw instrumentation with virage screw system with arrow guidance protocol and C7-T4 posterolateral fusion. The cells were positive for the estrogen receptor. HER-2/neu testing by [facility] showed again equivocal results,  (a) most recent bone scan 10/01/2016 finds no new lesions only postoperative changes  (7) radiation 11/23/2014-12/11/2014.  (a) T1-T5 was treated to 30 Gy in 12 fractions at 2.5 Gy per fraction   (8) initiated close follow-up while considering eventual left lumpectomy or mastectomy depending on  longer-term results of systemic therapy  (a) most recent left breast ultrasonography 10/30/2016 found this mass to measure 0.7 cm  (b) repeat left breast ultrasonography 05/13/2017 showed the upper outer quadrant mass to have grown to 1.4 cm  (c) left breast ultrasound 09/08/2017 shows the mass to now measure 0.9 cm  (d) left breast biopsy x2 on 12/11/2017 shows high-grade ductal carcinoma in situ, essentially estrogen receptor negative  (9) genetics testing using the Breast/Ovarian Cancer Panel through [facility] [name], MD) found no deleterious mutations in ATM, BARD1, BRCA1, BRCA2, BRIP1, CDH1, CHEK2, EPCAM, FANCC, MLH1, MSH2, MSH6, NBN, PALB2, PMS2, PTEN, RAD51C, RAD51D, STK11, TP53, or XRCC2    (10) right thyroid nodule is a complex cyst as noted on CT scan of the neck 01/29/2016  (11) osteoporosis: Bone density at [facility] 09/12/2016 showed a T score of -4.8.  (a) switched to Prolia starting 03/29/2020  (12) fulvestrant started 05/29/2017, last dose  07/22/2018 (discontinued due to pandemic).  (a) started palbociclib 06/29/2017 at 75 mg every other day for 21 days on, 7 days off  (b) palbociclib discontinued on 3/29 due to progressive fatigue and patient preference  (13)  was to start abemaciclib 02/04/2018, but patient opted for going back to palbociclib at a lower dose beginning in November 19, patient subsequently declined s palbociclib, and  proceeded to surgery  (14) status post left lumpectomy 05/03/2018 showing a pT1b pNX invasive ductal carcinoma, grade 2, with equivocal HER-2 results  (a) opted against adjuvant radiation given presence of stage IV disease  (15) tamoxifen supposed to have been started started 09/13/2018 as a "bridge" pending resumption of fulvestrant/denosumab, but never started by the patient  (16) PET scan 11/02/2018 showed no active disease  (a) [place] scan on 10/31/2019 shows activity in 2 liver lesions, no active bone or lung lesions  (b) abdominal ultrasound  11/08/2019 confirms 2 liver lesions measuring 4.4 and 2.1 cm.  (c) biopsy of 1 of the liver lesions 12/12/2019 confirmed metastatic breast cancer, estrogen and progesterone receptor positive, with an MIB-1 of 10%, and HER-2 positive, with a copy number of 2.12 and the number per cell 6.25  (d) abdominal ultrasound 03/12/2020 shows one of the liver lesions to have increased, while the second lesion has decreased  (17) letrozole started 02/15/2019, discontinued 12/02/2019 with evidence of progression  (a) bone density 09/12/2016 showed a T score of -4.8.  (18) fulvestrant resumed 12/08/2019  (a) palbociclib added at 75 mg daily 21/7 starting 12/20/2019   (b) fulvestrant and palbociclib discontinued September 2022 with evidence of progression.  (19) trastuzumab started 04/25/2020, repeat every 28 days  (a) echo 03/12/2020 shows an ejection fraction in the 60-65% range  (b) changed to subQ formulation of trastuzumab 06/20/2020  (c) trastuzumab  discontinued September 2022 with MRI of the abdomen showing evidence of progression  (20) starting Enhertu on 02/07/2021  (A) echo 11/21/2020 showed an ejection fraction in the 60-65% range  (B) Enhertu changed to every 4 weeks after the second dose to allow for additional recovery time  (C) treatment held after the third dose with significant drop in the patient's functional status  (D) restaging abdominal MRI 05/14/2021  (21) lower extremity Doppler ultrasound 04/25/2021 found acute DVT involving the right femoral vein and other proximal veins.  The left side was benign  (A) rivaroxaban started 04/25/2021, she stopped it 07/03/2021  22, MRI abdomen showed substantial reduction in size of the scattered hepatic metastatic lesions compatible with response to therapy other imaging findings of potential clinical significance, right middle lobe scarring, mild cardiomegaly, pectus excavated him, colonic diverticulosis, aortic atherosclerosis, lumbar scoliosis with stable remote compression fractures, cholelithiasis.   PLAN: Next set of imaging scheduled for 07/17/2021 [facility] to proceed with Enhertu as scheduled provided labs are within parameters, CBC today is normal She says she doesn't want to take eliquis because she is having nose bleeds every day. Discussed in much clarity that the blood thinners are extremely important  and blood clots can cause sudden death. Discussed that epistaxis usually doesn't lead to significant blood loss and her Hb is completely normal today. She says she will think about it, we also mentioned that people tend to clot more with active cacner. RTC in 4 weeks with me for inperson appointment, telephone appointment next week to discuss imaging results.  I spent 30 minutes in the care of this patient including review of previous medical records, history, physical examination, discussion about imaging report, follow-up recommendations and treatment recommendations.  *Total  Encounter Time as defined by the Centers for Medicare and Medicaid Services includes, in addition to the face-to-face time of a patient visit (documented in the note above) non-face-to-face time: obtaining and reviewing outside history, ordering and reviewing medications, tests or procedures, care coordination (communications with other health care professionals or caregivers) and documentation in the medical record.  Benay Pike MD

## 2021-07-15 ENCOUNTER — Encounter: Payer: Self-pay | Admitting: Hematology and Oncology

## 2021-07-15 NOTE — Telephone Encounter (Signed)
No entry 

## 2021-07-17 ENCOUNTER — Ambulatory Visit (HOSPITAL_COMMUNITY)
Admission: RE | Admit: 2021-07-17 | Discharge: 2021-07-17 | Disposition: A | Discharge status: Home / Self Care | Payer: Medicare Other | Source: Ambulatory Visit | Attending: Hematology and Oncology | Admitting: Hematology and Oncology

## 2021-07-17 ENCOUNTER — Encounter (HOSPITAL_COMMUNITY): Payer: Self-pay

## 2021-07-17 ENCOUNTER — Other Ambulatory Visit: Payer: Self-pay

## 2021-07-17 DIAGNOSIS — C787 Secondary malignant neoplasm of liver and intrahepatic bile duct: Secondary | ICD-10-CM | POA: Diagnosis not present

## 2021-07-17 DIAGNOSIS — C50919 Malignant neoplasm of unspecified site of unspecified female breast: Secondary | ICD-10-CM | POA: Diagnosis not present

## 2021-07-17 DIAGNOSIS — K769 Liver disease, unspecified: Secondary | ICD-10-CM | POA: Diagnosis not present

## 2021-07-17 DIAGNOSIS — M4856XA Collapsed vertebra, not elsewhere classified, lumbar region, initial encounter for fracture: Secondary | ICD-10-CM | POA: Diagnosis not present

## 2021-07-17 DIAGNOSIS — K8020 Calculus of gallbladder without cholecystitis without obstruction: Secondary | ICD-10-CM | POA: Diagnosis not present

## 2021-07-17 DIAGNOSIS — Z853 Personal history of malignant neoplasm of breast: Secondary | ICD-10-CM | POA: Diagnosis not present

## 2021-07-17 DIAGNOSIS — C50412 Malignant neoplasm of upper-outer quadrant of left female breast: Secondary | ICD-10-CM

## 2021-07-17 DIAGNOSIS — Z170 Estrogen receptor positive status [ER+]: Secondary | ICD-10-CM | POA: Insufficient documentation

## 2021-07-17 DIAGNOSIS — C7951 Secondary malignant neoplasm of bone: Secondary | ICD-10-CM | POA: Insufficient documentation

## 2021-07-17 DIAGNOSIS — J984 Other disorders of lung: Secondary | ICD-10-CM | POA: Diagnosis not present

## 2021-07-17 DIAGNOSIS — M4854XA Collapsed vertebra, not elsewhere classified, thoracic region, initial encounter for fracture: Secondary | ICD-10-CM | POA: Diagnosis not present

## 2021-07-17 MED ORDER — GADOBUTROL 1 MMOL/ML IV SOLN
5.0000 mL | Freq: Once | INTRAVENOUS | Status: AC | PRN
  Administered 2021-07-17: 5 mL via INTRAVENOUS

## 2021-07-17 MED ORDER — IOHEXOL 300 MG/ML  SOLN
75.0000 mL | Freq: Once | INTRAMUSCULAR | Status: AC | PRN
  Administered 2021-07-17: 75 mL via INTRAVENOUS

## 2021-07-17 MED ORDER — SODIUM CHLORIDE (PF) 0.9 % IJ SOLN
INTRAMUSCULAR | Status: AC
  Filled 2021-07-17: qty 50

## 2021-07-18 ENCOUNTER — Inpatient Hospital Stay: Payer: Medicare Other | Admitting: Hematology and Oncology

## 2021-07-18 NOTE — Progress Notes (Signed)
[place]  [phone] [phone]    ID: April Rosales DOB: November 18, 1940  MR#: [id number]  EHU#:[id number]  Patient Care Team: April Pinto, MD as PCP - General (Internal Medicine) Rosales, April Dad, MD (Inactive) as Consulting Physician (Oncology) April Killings, MD as Consulting Physician (Orthopedic Surgery) April Skates, MD as Consulting Physician (General Surgery) April Dresser, MD as Consulting Physician (Cardiology) April Rosales, RPH-CPP (Pharmacist)  OTHER: April Rosales, DDS   CHIEF COMPLAINT: Stage IV estrogen receptor positive breast cancer  CURRENT TREATMENT: Enhertu; denosumab/Prolia every 6 months;   INTERVAL HISTORY:  April Rosales here for follow-up after before her enhertu.  After last visit, the first week was hard, more tired. She still can do all her ADL. No overt diarrhea, stool is soft. No fevers, has occasional chills. No change in breathing, cough, chest pain. No change in urinary habits No falls. She complains of some dizziness when she stands up. She has noticed loss of appetite for the first few days after chemotherapy. Nails have been splitting more. Rest of the pertinent 10 point ROS reviewed and negative.  REVIEW OF SYSTEMS:    COVID 19 VACCINATION STATUS: [place] x2, most recently 11/2019, no boosters as of September 2022   BREAST CANCER HISTORY: From the original intake note:   April Rosales underwent right lumpectomy in [facility] at [facility] for what sounds like a stage I breast cancer. She tells me she had more than 30 lymph nodes removed from her right axilla and all of them were clear. She received  adjuvant radiation but no systemic treatment.  The patient had recently refused mammography with the last mammogram I can find dating back to August 2010.  More recently the patient presented with left scapular pain radiating down the left arm.  She was evaluated by Dr. Lorin Rosales, who obtained a  chest x-ray showing a possible abnormality at T3. He then set up the patient for an MRI of the thoracic spine performed 08/16/2014.  This showed multiple compression fractures  (a similar picture had been noted on lumbar MRI 10/09/2011 ). However at T3 they noted tumor in the vertebral body extending into the left pedicle and into the lateral epidural space, displacing the cord to the right. There was no evidence of cord compression or cord signal abnormality. There were no other areas of tumor identified in the thoracic spine.         The patient was then referred to Dr. Hal Rosales who on 08/24/2014 set April Rosales up for CT scans of the chest, abdomen and pelvis. There was a dense mass in the upper inner quadrant of the left breast measuring 1.6 cm. There was a 1.2 cm nodule in the minor fissure of the right lung and some evidence of right lung fibrosis at the site of the prior radiation port.  There was also a thyroid mass measuring 2 cm. However there were no parenchymal lung or liver lesions. Incidental meningoceles were noted as well as sclerosis of the fifth and sixth ribs which were felt to be likely posttraumatic.   On 08/29/2014 the patient underwent bilateral diagnostic mammography with tomosynthesis and left breast ultrasonography.  There were postsurgical changes in the upper right breast. In the left breast there was an irregular mass measuring 2.3 cm in the upper inner quadrant. This was palpable. Ultrasound showed this to be hypoechoic and to measure 2.0 cm. There were adjacent areas of nodularity. There was no definite lymphadenopathy in the left  axilla.  Biopsy of this breast mass 08/29/2014 showed an invasive adenocarcinoma with both ductal and lobular features (there was strong diffuse E-cadherin expression as well as areas with total absence of E-cadherin expression), with the preliminary prognostic profile showing strong estrogen positivity, very weak to near absent progesterone positivity, an  MIB-1 of approximately 40%, and HER-2 equivocal  The patient's subsequent history is as detailed below.   PAST MEDICAL HISTORY: Past Medical History:  Diagnosis Date   Arthritis    Breast cancer ([place]) 1985/2016   takes [name] daily   Chronic back pain    stenosis/listhesis   Family history of ovarian cancer    Osteoporosis    takes Vit D   Radiation 11/23/14-12/11/14   30 Gy T1-T5     PAST SURGICAL HISTORY: Past Surgical History:  Procedure Laterality Date   ABDOMINAL HYSTERECTOMY     1980   APPENDECTOMY  5852   APPLICATION OF INTRAOPERATIVE CT SCAN N/A 10/20/2014   Procedure: APPLICATION OF INTRAOPERATIVE CAT SCAN;  Surgeon: April Chimera, MD;  Location: [place] [facility];  Service: Neurosurgery;  Laterality: N/A;   BREAST LUMPECTOMY WITH RADIOACTIVE SEED LOCALIZATION Left 05/03/2018   Procedure: LEFT BREAST LUMPECTOMY WITH BRACKETED RADIOACTIVE SEED LOCALIZATION;  Surgeon: April Skates, MD;  Location: [facility];  Service: General;  Laterality: Left;   BREAST SURGERY Right 1985   THYROID CYST EXCISION  1967    FAMILY HISTORY Family History  Problem Relation Age of Onset   Heart disease Mother    Hypertension Mother    Heart disease Father    Diabetes Father    Breast cancer Paternal Aunt        4 paternal aunts with breast cancer over 88   Prostate cancer Paternal Uncle    Stroke Paternal Grandfather    Ovarian cancer Paternal Aunt    Huntington's disease Other        Nephew, inherited from his father  The patient's father died from a heart attack at the age of 31. He had 9 sisters. 3 of those sisters had breast cancer, all in a menopausal setting. Another sister had ovarian cancer. One of the paternal uncles had cancer of the colon "and back".  The patient's mother died at the age of 41. She was found to have breast cancer shortly before dying , during her final hospitalization.   GYNECOLOGIC HISTORY:   No LMP recorded. Patient  has had a hysterectomy.  Menarche age 2, first live birth age 45, the patient is GX P1. She underwent hysterectomy in 1980. She thinks the ovaries were removed, but the CT scan obtained 08/24/2014 showed a definite right ovary. The left ovary may have been removed. She did not take hormone replacement after the hysterectomy.   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 She is divorced, lives alone, with 2 cats. Her son Bethann Berkshire lives in [place] where he works in Engineer, technical sales. He has 4 children of his own. The patient is a Psychologist, forensic.   ADVANCED DIRECTIVES:  In place. The patient has named her sister Pleas Koch [phone]) and her friend Janina Mayo [phone]) as joint healthcare powers of attorney   HEALTH MAINTENANCE: Social History   Tobacco Use   Smoking status: Former    Packs/day: 0.50    Years: 10.00    Pack years: 5.00    Types: Cigarettes    Quit date: 05/03/1970    Years since quitting: 51.2   Smokeless tobacco: Former   Tobacco  comments:    quit smoking 1970's  Substance Use Topics   Alcohol use: Yes    Alcohol/week: 0.0 standard drinks    Comment: Rare   Drug use: No     Colonoscopy: never  PAP: status post hysterectomy  Bone density: 09/12/2016 [facility]/ T score of -4.8 osteoporosis  Lipid panel:  Allergies  Allergen Reactions   Ativan [Lorazepam]     Made her crazy   Dilaudid [Hydromorphone Hcl]     Doesn't want   Haldol [Haloperidol Lactate]     Made her crazy   Xarelto [Rivaroxaban]     Pt reports debilitating dizziness.    Current Outpatient Medications  Medication Sig Dispense Refill   apixaban (ELIQUIS) 5 MG TABS tablet Take 1 tablet (5 mg total) by mouth 2 (two) times daily. 60 tablet 1   Cholecalciferol (VITAMIN D3 ADULT GUMMIES PO) Take 2,000 Units by mouth daily.      Cyanocobalamin (VITAMIN B-12 PO) Take by mouth daily. Takes every other day     dexamethasone (DECADRON) 4 MG tablet Take 2 tablets (8 mg  total) by mouth daily. Start the day after chemotherapy for 2 days. 30 tablet 1   Iron-Vitamins (GERITOL PO) Take by mouth daily.     OVER THE COUNTER MEDICATION OTC Apple Cider Vinegar 1 capsule daily.     prochlorperazine (COMPAZINE) 10 MG tablet Take 1 tablet (10 mg total) by mouth every 6 (six) hours as needed (Nausea or vomiting). 30 tablet 1   No current facility-administered medications for this visit.    OBJECTIVE:   There were no vitals filed for this visit.    Wt Readings from Last 3 Encounters:  07/10/21 107 lb (48.5 kg)  06/11/21 107 lb 4 oz (48.6 kg)  05/22/21 108 lb 6 oz (49.2 kg)   There is no height or weight on file to calculate BMI.    ECOG FS:1 - Symptomatic but completely ambulatory  Physical Exam Constitutional:      Appearance: Normal appearance.     Comments: Frail-appearing, alopecia noted  HENT:     Head: Normocephalic and atraumatic.  Cardiovascular:     Rate and Rhythm: Normal rate and regular rhythm.  Pulmonary:     Effort: Pulmonary effort is normal.     Breath sounds: Normal breath sounds.  Abdominal:     General: Abdomen is flat.     Palpations: Abdomen is soft.  Musculoskeletal:        General: Swelling (Right lower extremity swelling greater than left lower extremity, at baseline, no change, wearing compression socks) present. No tenderness.     Cervical back: Normal range of motion and neck supple. No rigidity.  Lymphadenopathy:     Cervical: No cervical adenopathy.  Skin:    General: Skin is warm.  Neurological:     General: No focal deficit present.     Mental Status: She is alert.  Psychiatric:        Mood and Affect: Mood normal.     LAB RESULTS:  CMP     Component Value Date/Time   NA 142 07/10/2021 1312   NA 141 05/29/2017 1417   K 3.8 07/10/2021 1312   K 3.6 05/29/2017 1417   CL 107 07/10/2021 1312   CO2 29 07/10/2021 1312   CO2 27 05/29/2017 1417   GLUCOSE 117 (H) 07/10/2021 1312   GLUCOSE 111 05/29/2017  1417   BUN 9 07/10/2021 1312   BUN 17.0 05/29/2017 1417   CREATININE 0.55  07/10/2021 1312   CREATININE 0.66 03/05/2020 1218   CREATININE 0.8 05/29/2017 1417   CALCIUM 9.6 07/10/2021 1312   CALCIUM 10.7 (H) 06/26/2017 1216   CALCIUM 10.4 05/29/2017 1417   PROT 6.3 (L) 07/10/2021 1312   PROT 7.3 05/29/2017 1417   ALBUMIN 3.7 07/10/2021 1312   ALBUMIN 3.9 05/29/2017 1417   AST 21 07/10/2021 1312   AST 34 12/02/2019 1147   AST 17 05/29/2017 1417   ALT 13 07/10/2021 1312   ALT 30 12/02/2019 1147   ALT 15 05/29/2017 1417   ALKPHOS 36 (L) 07/10/2021 1312   ALKPHOS 42 05/29/2017 1417   BILITOT 0.4 07/10/2021 1312   BILITOT 0.4 12/02/2019 1147   BILITOT 0.30 05/29/2017 1417   GFRNONAA >60 07/10/2021 1312   GFRNONAA 83 03/05/2020 1218   GFRAA 97 03/05/2020 1218    INo results found for: SPEP, UPEP  Lab Results  Component Value Date   WBC 5.1 07/10/2021   NEUTROABS 3.0 07/10/2021   HGB 14.5 07/10/2021   HCT 43.0 07/10/2021   MCV 94.3 07/10/2021   PLT 221 07/10/2021      Chemistry      Component Value Date/Time   NA 142 07/10/2021 1312   NA 141 05/29/2017 1417   K 3.8 07/10/2021 1312   K 3.6 05/29/2017 1417   CL 107 07/10/2021 1312   CO2 29 07/10/2021 1312   CO2 27 05/29/2017 1417   BUN 9 07/10/2021 1312   BUN 17.0 05/29/2017 1417   CREATININE 0.55 07/10/2021 1312   CREATININE 0.66 03/05/2020 1218   CREATININE 0.8 05/29/2017 1417      Component Value Date/Time   CALCIUM 9.6 07/10/2021 1312   CALCIUM 10.7 (H) 06/26/2017 1216   CALCIUM 10.4 05/29/2017 1417   ALKPHOS 36 (L) 07/10/2021 1312   ALKPHOS 42 05/29/2017 1417   AST 21 07/10/2021 1312   AST 34 12/02/2019 1147   AST 17 05/29/2017 1417   ALT 13 07/10/2021 1312   ALT 30 12/02/2019 1147   ALT 15 05/29/2017 1417   BILITOT 0.4 07/10/2021 1312   BILITOT 0.4 12/02/2019 1147   BILITOT 0.30 05/29/2017 1417       Lab Results  Component Value Date   LABCA2 24 01/01/2016    No components found for:  [id number]  No results for input(s): INR in the last 168 hours.  Urinalysis    Component Value Date/Time   COLORURINE YELLOW 03/05/2020 [place] 03/05/2020 1218   LABSPEC 1.006 03/05/2020 [place] 7.0 03/05/2020 [place] 03/05/2020 [place] 03/05/2020 [place] 09/19/2015 [place] 03/05/2020 [place] 03/05/2020 1218   NITRITE NEGATIVE 03/05/2020 1218   LEUKOCYTESUR 2+ (A) 03/05/2020 1218    STUDIES: CT Chest W Contrast  Result Date: 07/19/2021 CLINICAL DATA:  An 81 year old female presents with history of breast cancer, assess treatment response. Ongoing chemotherapy post radiotherapy be remotely. EXAM: CT CHEST WITH CONTRAST TECHNIQUE: Multidetector CT imaging of the chest was performed during intravenous contrast administration. RADIATION DOSE REDUCTION: This exam was performed according to the departmental dose-optimization program which includes automated exposure control, adjustment of the mA and/or kV according to patient size and/or use of iterative reconstruction technique. CONTRAST:  74m OMNIPAQUE IOHEXOL 300 MG/ML  SOLN COMPARISON:  November of 2018 and MRI performed on the same date and reported separately. FINDINGS: Cardiovascular: Normal caliber of the thoracic aorta. Heart size  top normal without pericardial effusion. Central pulmonary vasculature is unremarkable on venous phase. Mediastinum/Nodes: Mild circumferential distal esophageal thickening unchanged. No hilar adenopathy. No thoracic inlet adenopathy. No axillary lymphadenopathy. Lungs/Pleura: Pleural and parenchymal scarring. This is most notable about the anterior RIGHT upper lobe and middle lobe related to prior radiotherapy in the setting of breast cancer and is unchanged. No effusion. No consolidative changes. Airways are patent. Upper Abdomen: Cholelithiasis. Signs of hepatic metastatic disease better displayed on  recent abdominal MR evaluation. Adrenal glands are normal. No acute findings related to upper abdominal contents. Musculoskeletal: Signs of spinal fusion about the upper thoracic spine spanning a lytic lesion in the T3 vertebral body with no substantial change. Signs of lumbar compression fractures and thoracic compression fractures also without substantial change aside from some increased loss of height at the T11 level with mild retropulsion, 1-2 mm of posterior cortical elements and approximately 20% loss of height. IMPRESSION: 1. No evidence of metastatic disease to the chest. 2. Signs of spinal fusion about the upper thoracic spine spanning a lytic lesion in the T3 vertebral body with no substantial change. 3. Signs of hepatic metastatic disease better displayed on recent abdominal MR evaluation. 4. Signs of compression fractures partially visualized in the lumbar spine and without substantial change in the thoracic spine aside from a new/interval compression fracture involving T11 which is age indeterminate, associated with 20% loss of height and mild retropulsion of posterior cortical elements posteriorly. Correlate with any point tenderness in this area. Electronically Signed   By: Zetta Bills M.D.   On: 07/19/2021 12:55   MR Abdomen W Wo Contrast  Result Date: 07/19/2021 CLINICAL DATA:  A female at age 20 presents for evaluation of metastatic breast cancer. EXAM: MRI ABDOMEN WITHOUT AND WITH CONTRAST TECHNIQUE: Multiplanar multisequence MR imaging of the abdomen was performed both before and after the administration of intravenous contrast. CONTRAST:  33m GADAVIST GADOBUTROL 1 MMOL/ML IV SOLN COMPARISON:  May 14, 2021 FINDINGS: Lower chest: Please see dedicated chest CT that was acquired concurrently and is reported separately for further detail. No effusion or consolidation at the lung bases. Hepatobiliary: Multifocal hepatic metastatic disease with similar distribution when compared to the  prior study. Areas are actually best seen on the diffusion-weighted imaging. Contrasted imaging is compromised by profound respiratory motion related artifact. For this reason comparison is made utilizing diffusion-weighted images. Dominant lesion in the dome of the RIGHT hemi liver measuring 4.6 cm in approximate greatest dimension is unchanged. (Image 41/2) Multifocal areas of restricted diffusion throughout the LEFT hepatic lobe also without substantial change. Lesion along the plane of the middle hepatic vein (image 45/3) 11 mm, previously 14 mm. (Image 45/3) Other areas are very similar and there are numerous areas which would make detection of very subtle smaller new lesions difficult but there are no gross areas of new disease in the liver. Cholelithiasis. No biliary duct distension. No pericholecystic stranding. Pancreas: Normal intrinsic T1 signal. No ductal dilation or sign of inflammation. No focal lesion. Spleen:  Normal spleen. Adrenals/Urinary Tract: No signs of hydronephrosis, perinephric stranding or abnormality of the adrenal glands. Contrasted imaging again compromised by respiratory motion. Stomach/Bowel: Bowel grossly unremarkable to the extent evaluated on abdominal MRI compromised by motion related artifact. Vascular/Lymphatic: No signs of aortic dilation. No abdominal adenopathy. Other:  No ascites. Musculoskeletal: Compression fractures at L1, L2 and L3 with similar appearance. No visible bony lesion to the extent evaluated on this abdominal MRI. IMPRESSION: 1. Multifocal hepatic metastatic disease  with similar distribution when compared to the prior study. Contrasted imaging is compromised by profound respiratory motion related artifact. For this reason comparison is made utilizing diffusion-weighted images. Accounting for this lesions are quite similar with perhaps slight interval decreased size in some of the smaller lesions but with stable appearance of the dominant lesion near the dome  of the RIGHT hemi liver in terms of size. 2. No gross areas of new disease in the liver. 3. Cholelithiasis with large gallstones in the the gallbladder lumen measuring up to 11-12 mm greatest size. Electronically Signed   By: Zetta Bills M.D.   On: 07/19/2021 12:37     ASSESSMENT: 81 y.o. [place] woman with stage IV left breast cancer involving bone  (1) status post right lumpectomy and axillary node dissection in 1985 followed by radiation at [facility] 08/16/2014: measurable disease in spine, lung and left breast   (2) evaluation for left shoulder pain led to thoracic spine MRI 08/16/2014 showing a pathologic fracture at T3 with epidural tumor displacing the cord to the right, but no cord compression. CT scans of the chest, abdomen and pelvis 08/24/2014 showed in addition a mass in the upper outer quadrant left breast measuring 1.6 cm and a nodule in the minor fissure of the right lung measuring 1.2 cm, but no parenchymal lung or liver lesions.   (a) CA 27-29 was noninformative at 38 (09/20/2014)    (3) mammography and ultrasonography 08/29/2014 show a mass in the upper inner left breast which was palpable,  measuring 2.0 cm by ultrasound. Biopsy of this mass 08/29/2014 showed an invasive breast cancer with both lobular and ductal features, estrogen receptor positive, progesterone receptor weakly positive, with an MIB-1 in the 40% range, HER-2 equivocal (6 else ratio 1.5, but average number her nucleus 5.8)   (4) letrozole started 08/31/2014;   (a) palbociclib added Sept 2016 at 75 mg 21/7, with significant neutropenia; not repeated after first cycle  (b) letrozole discontinued 05/29/2017 with evidence of disease progression in the breast   (5)  zolendronate started 09/20/2014, stopped after initial dose due to poor tolerance  (a) denosumab/ Xgeva started 01/31/2015, repeated every 4 weeks  (b) changed to every 8 weeks as of August 2018.   (c) every 12 weeks recommended and  ordered to start 01/2020   (6) on 10/20/2014 the patient underwent T2-T3 and T4 decompressive laminectomy with removal of epidural tumor, C7-T4 segmental pedicle screw instrumentation with virage screw system with arrow guidance protocol and C7-T4 posterolateral fusion. The cells were positive for the estrogen receptor. HER-2/neu testing by [facility] showed again equivocal results,  (a) most recent bone scan 10/01/2016 finds no new lesions only postoperative changes  (7) radiation 11/23/2014-12/11/2014.  (a) T1-T5 was treated to 30 Gy in 12 fractions at 2.5 Gy per fraction   (8) initiated close follow-up while considering eventual left lumpectomy or mastectomy depending on  longer-term results of systemic therapy  (a) most recent left breast ultrasonography 10/30/2016 found this mass to measure 0.7 cm  (b) repeat left breast ultrasonography 05/13/2017 showed the upper outer quadrant mass to have grown to 1.4 cm  (c) left breast ultrasound 09/08/2017 shows the mass to now measure 0.9 cm  (d) left breast biopsy x2 on 12/11/2017 shows high-grade ductal carcinoma in situ, essentially estrogen receptor negative  (9) genetics testing using the Breast/Ovarian Cancer Panel through [name], MD) found no deleterious mutations in ATM, BARD1, BRCA1, BRCA2, BRIP1, CDH1, CHEK2, EPCAM, FANCC, MLH1, MSH2, MSH6,  NBN, PALB2, PMS2, PTEN, RAD51C, RAD51D, STK11, TP53, or XRCC2    (10) right thyroid nodule is a complex cyst as noted on CT scan of the neck 01/29/2016  (11) osteoporosis: [facility] density at [facility] 09/12/2016 showed a T score of -4.8.  (a) switched to Prolia starting 03/29/2020  (12) fulvestrant started 05/29/2017, last dose 07/22/2018 (discontinued due to pandemic).  (a) started palbociclib 06/29/2017 at 75 mg every other day for 21 days on, 7 days off  (b) palbociclib discontinued on 3/29 due to progressive fatigue and patient preference  (13)  was to start abemaciclib 02/04/2018, but patient  opted for going back to palbociclib at a lower dose beginning in November 19, patient subsequently declined s palbociclib, and  proceeded to surgery  (14) status post left lumpectomy 05/03/2018 showing a pT1b pNX invasive ductal carcinoma, grade 2, with equivocal HER-2 results  (a) opted against adjuvant radiation given presence of stage IV disease  (15) tamoxifen supposed to have been started started 09/13/2018 as a "bridge" pending resumption of fulvestrant/denosumab, but never started by the patient  (16) PET scan 11/02/2018 showed no active disease  (a) [place] scan on 10/31/2019 shows activity in 2 liver lesions, no active bone or lung lesions  (b) abdominal ultrasound 11/08/2019 confirms 2 liver lesions measuring 4.4 and 2.1 cm.  (c) biopsy of 1 of the liver lesions 12/12/2019 confirmed metastatic breast cancer, estrogen and progesterone receptor positive, with an MIB-1 of 10%, and HER-2 positive, with a copy number of 2.12 and the number per cell 6.25  (d) abdominal ultrasound 03/12/2020 shows one of the liver lesions to have increased, while the second lesion has decreased  (17) letrozole started 02/15/2019, discontinued 12/02/2019 with evidence of progression  (a) bone density 09/12/2016 showed a T score of -4.8.  (18) fulvestrant resumed 12/08/2019  (a) palbociclib added at 75 mg daily 21/7 starting 12/20/2019   (b) fulvestrant and palbociclib discontinued September 2022 with evidence of progression.  (19) trastuzumab started 04/25/2020, repeat every 28 days  (a) echo 03/12/2020 shows an ejection fraction in the 60-65% range  (b) changed to subQ formulation of trastuzumab 06/20/2020  (c) trastuzumab discontinued September 2022 with MRI of the abdomen showing evidence of progression  (20) starting Enhertu on 02/07/2021  (A) echo 11/21/2020 showed an ejection fraction in the 60-65% range  (B) Enhertu changed to every 4 weeks after the second dose to allow for additional recovery  time  (C) treatment held after the third dose with significant drop in the patient's functional status  (D) restaging abdominal MRI 05/14/2021  (21) lower extremity Doppler ultrasound 04/25/2021 found acute DVT involving the right femoral vein and other proximal veins.  The left side was benign  (A) rivaroxaban started 04/25/2021, she stopped it 07/03/2021  22, MRI abdomen showed substantial reduction in size of the scattered hepatic metastatic lesions compatible with response to therapy other imaging findings of potential clinical significance, right middle lobe scarring, mild cardiomegaly, pectus excavated him, colonic diverticulosis, aortic atherosclerosis, lumbar scoliosis with stable remote compression fractures, cholelithiasis.   PLAN: Next set of imaging scheduled for 07/17/2021 [facility] to proceed with Enhertu as scheduled provided labs are within parameters, CBC today is normal She says she doesn't want to take eliquis because she is having nose bleeds every day. Discussed in much clarity that the blood thinners are extremely important  and blood clots can cause sudden death. Discussed that epistaxis usually doesn't lead to significant blood loss and her Hb is completely normal today. She  says she will think about it, we also mentioned that people tend to clot more with active cacner. RTC in 4 weeks with me for inperson appointment, telephone appointment next week to discuss imaging results.  I spent 30 minutes in the care of this patient including review of previous medical records, history, physical examination, discussion about imaging report, follow-up recommendations and treatment recommendations.  *Total Encounter Time as defined by the Centers for Medicare and Medicaid Services includes, in addition to the face-to-face time of a patient visit (documented in the note above) non-face-to-face time: obtaining and reviewing outside history, ordering and reviewing medications, tests or procedures,  care coordination (communications with other health care professionals or caregivers) and documentation in the medical record.  Benay Pike MD  This encounter was created in error - please disregard.

## 2021-07-19 ENCOUNTER — Encounter: Payer: Self-pay | Admitting: Hematology and Oncology

## 2021-07-19 ENCOUNTER — Inpatient Hospital Stay (HOSPITAL_BASED_OUTPATIENT_CLINIC_OR_DEPARTMENT_OTHER): Payer: Medicare Other | Admitting: Hematology and Oncology

## 2021-07-19 DIAGNOSIS — C50412 Malignant neoplasm of upper-outer quadrant of left female breast: Secondary | ICD-10-CM | POA: Diagnosis not present

## 2021-07-19 DIAGNOSIS — Z170 Estrogen receptor positive status [ER+]: Secondary | ICD-10-CM

## 2021-07-19 DIAGNOSIS — C7951 Secondary malignant neoplasm of bone: Secondary | ICD-10-CM

## 2021-07-19 DIAGNOSIS — C787 Secondary malignant neoplasm of liver and intrahepatic bile duct: Secondary | ICD-10-CM

## 2021-07-19 NOTE — Progress Notes (Signed)
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 Since her last visit, she has been feeling very weak.  She states this is very normal for her after the infusion.  She feels very tired for the first week to 2 weeks and then slowly recovers by third week and by fourth week she feels quite well.  She denies any unusual symptoms.  She is understandably very anxious to know her results.  No other concerning symptoms reported to me today.  REVIEW OF SYSTEMS:    COVID 19 VACCINATION STATUS: [place] x2, most recently 11/2019, no boosters as of September 2022   BREAST CANCER HISTORY: From the original intake note:   April Rosales underwent right lumpectomy in 1985 at [facility] for what sounds like a stage I breast cancer. She tells me she had more than 30 lymph nodes removed from her right axilla and all of them were clear. She received  adjuvant radiation but no systemic treatment.  The patient had recently refused mammography with the last mammogram I can find dating back to August 2010.  More recently the patient presented with left scapular pain radiating down the left arm.  She was evaluated by Dr. [name], who obtained a chest x-ray showing a possible abnormality at T3. He then set up the  patient for an MRI of the thoracic spine performed 08/16/2014.  This showed multiple compression fractures  (a similar picture had been noted on lumbar MRI 10/09/2011 ). However at T3 they noted tumor in the vertebral body extending into the left pedicle and into the lateral epidural space, displacing the cord to the right. There was no evidence of cord compression or cord signal abnormality. There were no other areas of tumor identified in the thoracic spine.         The patient was then referred to Dr. Hal Neer who on 08/24/2014 set [name] up for CT scans of the chest, abdomen and pelvis. There was a dense mass in the upper inner quadrant of the left breast measuring 1.6 cm. There was a 1.2 cm nodule in the minor fissure of the right lung and some evidence of right lung fibrosis at the site of the prior radiation port.  There was also a thyroid mass measuring 2 cm. However there were no parenchymal lung or liver lesions. Incidental meningoceles were noted as well as sclerosis of the fifth and sixth ribs which were felt to be likely posttraumatic.   On 08/29/2014 the patient underwent bilateral diagnostic mammography with tomosynthesis and left breast ultrasonography.  There were postsurgical changes in the upper right breast. In the left breast there was an irregular mass measuring 2.3 cm in the upper inner quadrant. This was palpable. Ultrasound showed this to be hypoechoic and to measure 2.0 cm. There were adjacent areas of nodularity. There was no definite lymphadenopathy in the left axilla.  Biopsy of this  breast mass 08/29/2014 showed an invasive adenocarcinoma with both ductal and lobular features (there was strong diffuse E-cadherin expression as well as areas with total absence of E-cadherin expression), with the preliminary prognostic profile showing strong estrogen positivity, very weak to near absent progesterone positivity, an MIB-1 of approximately 40%, and HER-2 equivocal  The patient's  subsequent history is as detailed below.   PAST MEDICAL HISTORY: Past Medical History:  Diagnosis Date   Arthritis    Breast cancer ([place]) 1985/2016   takes [name] daily   Chronic back pain    stenosis/listhesis   Family history of ovarian cancer    Osteoporosis    takes Vit D   Radiation 11/23/14-12/11/14   30 Gy T1-T5     PAST SURGICAL HISTORY: Past Surgical History:  Procedure Laterality Date   ABDOMINAL HYSTERECTOMY     1980   APPENDECTOMY  8588   APPLICATION OF INTRAOPERATIVE CT SCAN N/A 10/20/2014   Procedure: APPLICATION OF INTRAOPERATIVE CAT SCAN;  Surgeon: [name], MD;  Location: [facility];  Service: Neurosurgery;  Laterality: N/A;   BREAST LUMPECTOMY WITH RADIOACTIVE SEED LOCALIZATION Left 05/03/2018   Procedure: LEFT BREAST LUMPECTOMY WITH BRACKETED RADIOACTIVE SEED LOCALIZATION;  Surgeon: [name], MD;  Location: [facility];  Service: General;  Laterality: Left;   BREAST SURGERY Right 1985   THYROID CYST EXCISION  1967    FAMILY HISTORY Family History  Problem Relation Age of Onset   Heart disease Mother    Hypertension Mother    Heart disease Father    Diabetes Father    Breast cancer Paternal Aunt        4 paternal aunts with breast cancer over 22   Prostate cancer Paternal Uncle    Stroke Paternal Grandfather    Ovarian cancer Paternal Aunt    Huntington's disease Other        Nephew, inherited from his father  The patient's father died from a heart attack at the age of 14. He had 9 sisters. 3 of those sisters had breast cancer, all in a menopausal setting. Another sister had ovarian cancer. One of the paternal uncles had cancer of the colon "and back".  The patient's mother died at the age of 30. She was found to have breast cancer shortly before dying , during her final hospitalization.   GYNECOLOGIC HISTORY:   No LMP recorded. Patient has had a hysterectomy.  Menarche age 24, first live birth age 64, the patient is GX  P1. She underwent hysterectomy in 1980. She thinks the ovaries were removed, but the CT scan obtained 08/24/2014 showed a definite right ovary. The left ovary may have been removed. She did not take hormone replacement after the hysterectomy.   SOCIAL HISTORY:   Jameila worked in Psychiatric nurse. She is divorced, lives alone, with 2 cats. Her son Bethann Berkshire lives in [place] where he works in Engineer, technical sales. He has 4 children of his own. The patient is a Psychologist, forensic.   ADVANCED DIRECTIVES:  In place. The patient has named her sister Pleas Koch [phone]) and her friend Janina Mayo [phone]) as joint healthcare powers of attorney   HEALTH MAINTENANCE: Social History   Tobacco Use   Smoking status: Former    Packs/day: 0.50    Years: 10.00    Pack years: 5.00    Types: Cigarettes    Quit date: 05/03/1970    Years since quitting: 51.2   Smokeless tobacco: Former   Tobacco comments:    quit  smoking 1970's  Substance Use Topics   Alcohol use: Yes    Alcohol/week: 0.0 standard drinks    Comment: Rare   Drug use: No     Colonoscopy: never  PAP: status post hysterectomy  Bone density: 09/12/2016 [facility]/ T score of -4.8 osteoporosis  Lipid panel:  Allergies  Allergen Reactions   Ativan [Lorazepam]     Made her crazy   Dilaudid [Hydromorphone Hcl]     Doesn't want   Haldol [Haloperidol Lactate]     Made her crazy   Xarelto [Rivaroxaban]     Pt reports debilitating dizziness.    Current Outpatient Medications  Medication Sig Dispense Refill   apixaban (ELIQUIS) 5 MG TABS tablet Take 1 tablet (5 mg total) by mouth 2 (two) times daily. 60 tablet 1   Cholecalciferol (VITAMIN D3 ADULT GUMMIES PO) Take 2,000 Units by mouth daily.      Cyanocobalamin (VITAMIN B-12 PO) Take by mouth daily. Takes every other day     dexamethasone (DECADRON) 4 MG tablet Take 2 tablets (8 mg total) by mouth daily. Start the day after chemotherapy for 2 days. 30 tablet 1   Iron-Vitamins  (GERITOL PO) Take by mouth daily.     OVER THE COUNTER MEDICATION OTC Apple Cider Vinegar 1 capsule daily.     prochlorperazine (COMPAZINE) 10 MG tablet Take 1 tablet (10 mg total) by mouth every 6 (six) hours as needed (Nausea or vomiting). 30 tablet 1   No current facility-administered medications for this visit.    OBJECTIVE:   There were no vitals filed for this visit.    Wt Readings from Last 3 Encounters:  07/10/21 107 lb (48.5 kg)  06/11/21 107 lb 4 oz (48.6 kg)  05/22/21 108 lb 6 oz (49.2 kg)   There is no height or weight on file to calculate BMI.    ECOG FS:1 - Symptomatic but completely ambulatory Physical exam not done, telephone visit  LAB RESULTS:  CMP     Component Value Date/Time   NA 142 07/10/2021 1312   NA 141 05/29/2017 1417   K 3.8 07/10/2021 1312   K 3.6 05/29/2017 1417   CL 107 07/10/2021 1312   CO2 29 07/10/2021 1312   CO2 27 05/29/2017 1417   GLUCOSE 117 (H) 07/10/2021 1312   GLUCOSE 111 05/29/2017 1417   BUN 9 07/10/2021 1312   BUN 17.0 05/29/2017 1417   CREATININE 0.55 07/10/2021 1312   CREATININE 0.66 03/05/2020 1218   CREATININE 0.8 05/29/2017 1417   CALCIUM 9.6 07/10/2021 1312   CALCIUM 10.7 (H) 06/26/2017 1216   CALCIUM 10.4 05/29/2017 1417   PROT 6.3 (L) 07/10/2021 1312   PROT 7.3 05/29/2017 1417   ALBUMIN 3.7 07/10/2021 1312   ALBUMIN 3.9 05/29/2017 1417   AST 21 07/10/2021 1312   AST 34 12/02/2019 1147   AST 17 05/29/2017 1417   ALT 13 07/10/2021 1312   ALT 30 12/02/2019 1147   ALT 15 05/29/2017 1417   ALKPHOS 36 (L) 07/10/2021 1312   ALKPHOS 42 05/29/2017 1417   BILITOT 0.4 07/10/2021 1312   BILITOT 0.4 12/02/2019 1147   BILITOT 0.30 05/29/2017 1417   GFRNONAA >60 07/10/2021 1312   GFRNONAA 83 03/05/2020 1218   GFRAA 97 03/05/2020 1218    INo results found for: SPEP, UPEP  Lab Results  Component Value Date   WBC 5.1 07/10/2021   NEUTROABS 3.0 07/10/2021   HGB 14.5 07/10/2021   HCT 43.0 07/10/2021   MCV 94.3  07/10/2021   PLT 221 07/10/2021      Chemistry      Component Value Date/Time   NA 142 07/10/2021 1312   NA 141 05/29/2017 1417   K 3.8 07/10/2021 1312   K 3.6 05/29/2017 1417   CL 107 07/10/2021 1312   CO2 29 07/10/2021 1312   CO2 27 05/29/2017 1417   BUN 9 07/10/2021 1312   BUN 17.0 05/29/2017 1417   CREATININE 0.55 07/10/2021 1312   CREATININE 0.66 03/05/2020 1218   CREATININE 0.8 05/29/2017 1417      Component Value Date/Time   CALCIUM 9.6 07/10/2021 1312   CALCIUM 10.7 (H) 06/26/2017 1216   CALCIUM 10.4 05/29/2017 1417   ALKPHOS 36 (L) 07/10/2021 1312   ALKPHOS 42 05/29/2017 1417   AST 21 07/10/2021 1312   AST 34 12/02/2019 1147   AST 17 05/29/2017 1417   ALT 13 07/10/2021 1312   ALT 30 12/02/2019 1147   ALT 15 05/29/2017 1417   BILITOT 0.4 07/10/2021 1312   BILITOT 0.4 12/02/2019 1147   BILITOT 0.30 05/29/2017 1417       Lab Results  Component Value Date   LABCA2 24 01/01/2016    No components found for: TDHRC163  No results for input(s): INR in the last 168 hours.  Urinalysis    Component Value Date/Time   COLORURINE YELLOW 03/05/2020 [place] 03/05/2020 1218   LABSPEC 1.006 03/05/2020 [place] 7.0 03/05/2020 [place] 03/05/2020 [place] 03/05/2020 [place] 09/19/2015 [place] 03/05/2020 [place] 03/05/2020 1218   NITRITE NEGATIVE 03/05/2020 1218   LEUKOCYTESUR 2+ (A) 03/05/2020 1218    STUDIES: No results found.   ASSESSMENT: 81 y.o. [place] woman with stage IV left breast cancer involving bone  (1) status post right lumpectomy and axillary node dissection in 1985 followed by radiation at [facility] 08/16/2014: measurable disease in spine, lung and left breast   (2) evaluation for left shoulder pain led to thoracic spine MRI 08/16/2014 showing a pathologic fracture at T3 with epidural tumor displacing the cord to the  right, but no cord compression. CT scans of the chest, abdomen and pelvis 08/24/2014 showed in addition a mass in the upper outer quadrant left breast measuring 1.6 cm and a nodule in the minor fissure of the right lung measuring 1.2 cm, but no parenchymal lung or liver lesions.   (a) CA 27-29 was noninformative at 38 (09/20/2014)    (3) mammography and ultrasonography 08/29/2014 show a mass in the upper inner left breast which was palpable,  measuring 2.0 cm by ultrasound. Biopsy of this mass 08/29/2014 showed an invasive breast cancer with both lobular and ductal features, estrogen receptor positive, progesterone receptor weakly positive, with an MIB-1 in the 40% range, HER-2 equivocal (6 else ratio 1.5, but average number her nucleus 5.8)   (4) letrozole started 08/31/2014;   (a) palbociclib added Sept 2016 at 75 mg 21/7, with significant neutropenia; not repeated after first cycle  (b) letrozole discontinued 05/29/2017 with evidence of disease progression in the breast   (5)  zolendronate started 09/20/2014, stopped after initial dose due to poor tolerance  (a) denosumab/ Xgeva started 01/31/2015, repeated every 4 weeks  (b) changed to every 8 weeks as of August 2018.   (c) every 12 weeks recommended and ordered to start 01/2020   (6) on 10/20/2014 the patient underwent T2-T3  and T4 decompressive laminectomy with removal of epidural tumor, C7-T4 segmental pedicle screw instrumentation with virage screw system with arrow guidance protocol and C7-T4 posterolateral fusion. The cells were positive for the estrogen receptor. HER-2/neu testing by [facility] showed again equivocal results,  (a) most recent bone scan 10/01/2016 finds no new lesions only postoperative changes  (7) radiation 11/23/2014-12/11/2014.  (a) T1-T5 was treated to 30 Gy in 12 fractions at 2.5 Gy per fraction   (8) initiated close follow-up while considering eventual left lumpectomy or mastectomy depending on  longer-term results of  systemic therapy  (a) most recent left breast ultrasonography 10/30/2016 found this mass to measure 0.7 cm  (b) repeat left breast ultrasonography 05/13/2017 showed the upper outer quadrant mass to have grown to 1.4 cm  (c) left breast ultrasound 09/08/2017 shows the mass to now measure 0.9 cm  (d) left breast biopsy x2 on 12/11/2017 shows high-grade ductal carcinoma in situ, essentially estrogen receptor negative  (9) genetics testing using the Breast/Ovarian Cancer Panel through [name], MD) found no deleterious mutations in ATM, BARD1, BRCA1, BRCA2, BRIP1, CDH1, CHEK2, EPCAM, FANCC, MLH1, MSH2, MSH6, NBN, PALB2, PMS2, PTEN, RAD51C, RAD51D, STK11, TP53, or XRCC2    (10) right thyroid nodule is a complex cyst as noted on CT scan of the neck 01/29/2016  (11) osteoporosis: [facility] at [facility] 09/12/2016 showed a T score of -4.8.  (a) switched to Prolia starting 03/29/2020  (12) fulvestrant started 05/29/2017, last dose 07/22/2018 (discontinued due to pandemic).  (a) started palbociclib 06/29/2017 at 75 mg every other day for 21 days on, 7 days off  (b) palbociclib discontinued on 3/29 due to progressive fatigue and patient preference  (13)  was to start abemaciclib 02/04/2018, but patient opted for going back to palbociclib at a lower dose beginning in November 19, patient subsequently declined s palbociclib, and  proceeded to surgery  (14) status post left lumpectomy 05/03/2018 showing a pT1b pNX invasive ductal carcinoma, grade 2, with equivocal HER-2 results  (a) opted against adjuvant radiation given presence of stage IV disease  (15) tamoxifen supposed to have been started started 09/13/2018 as a "bridge" pending resumption of fulvestrant/denosumab, but never started by the patient  (16) PET scan 11/02/2018 showed no active disease  (a) [place] scan on 10/31/2019 shows activity in 2 liver lesions, no active bone or lung lesions  (b) abdominal ultrasound 11/08/2019  confirms 2 liver lesions measuring 4.4 and 2.1 cm.  (c) biopsy of 1 of the liver lesions 12/12/2019 confirmed metastatic breast cancer, estrogen and progesterone receptor positive, with an MIB-1 of 10%, and HER-2 positive, with a copy number of 2.12 and the number per cell 6.25  (d) abdominal ultrasound 03/12/2020 shows one of the liver lesions to have increased, while the second lesion has decreased  (17) letrozole started 02/15/2019, discontinued 12/02/2019 with evidence of progression  (a) bone density 09/12/2016 showed a T score of -4.8.  (18) fulvestrant resumed 12/08/2019  (a) palbociclib added at 75 mg daily 21/7 starting 12/20/2019   (b) fulvestrant and palbociclib discontinued September 2022 with evidence of progression.  (19) trastuzumab started 04/25/2020, repeat every 28 days  (a) echo 03/12/2020 shows an ejection fraction in the 60-65% range  (b) changed to subQ formulation of trastuzumab 06/20/2020  (c) trastuzumab discontinued September 2022 with MRI of the abdomen showing evidence of progression  (20) starting Enhertu on 02/07/2021  (A) echo 11/21/2020 showed an ejection fraction in the 60-65% range  (B) Enhertu changed to every 4 weeks  after the second dose to allow for additional recovery time  (C) treatment held after the third dose with significant drop in the patient's functional status  (D) restaging abdominal MRI 05/14/2021  (21) lower extremity Doppler ultrasound 04/25/2021 found acute DVT involving the right femoral vein and other proximal veins.  The left side was benign  (A) rivaroxaban started 04/25/2021, she stopped it 07/03/2021  22, MRI abdomen showed substantial reduction in size of the scattered hepatic metastatic lesions compatible with response to therapy other imaging findings of potential clinical significance, right middle lobe scarring, mild cardiomegaly, pectus excavated him, colonic diverticulosis, aortic atherosclerosis, lumbar scoliosis with stable  remote compression fractures, cholelithiasis.   PLAN: I have reviewed imaging results today with the patient.  It appears that overall she has stable disease although some of the hepatic lesions may have slightly decreased in size.  No evidence of disease in the chest. After reviewing the results, patient felt encouraged to continue Enhertu.  She would like to take 1 more dose of full dose on March 15 and after that she would want to try dose reduction but change to treatment plan to every 21 days instead.  I have made these changes today. We will anticipate repeat imaging in about 3 cycles to demonstrate ongoing response. I spent 15 minutes in the care of this patient including review of previous medical records, history,discussion about imaging report, follow-up recommendations and treatment recommendations.  I connected with  [name] on 07/19/21 by a telephone application and verified that I am speaking with the correct person using two identifiers.   I discussed the limitations of evaluation and management by telemedicine. The patient expressed understanding and agreed to proceed.   *Total Encounter Time as defined by the Centers for Medicare and Medicaid Services includes, in addition to the face-to-face time of a patient visit (documented in the note above) non-face-to-face time: obtaining and reviewing outside history, ordering and reviewing medications, tests or procedures, care coordination (communications with other health care professionals or caregivers) and documentation in the medical record.  Benay Pike MD

## 2021-07-23 ENCOUNTER — Other Ambulatory Visit (HOSPITAL_COMMUNITY): Payer: Medicare Other

## 2021-07-24 ENCOUNTER — Encounter: Payer: Self-pay | Admitting: Hematology and Oncology

## 2021-07-30 ENCOUNTER — Ambulatory Visit (HOSPITAL_COMMUNITY)
Admission: RE | Admit: 2021-07-30 | Discharge: 2021-07-30 | Disposition: A | Discharge status: Home / Self Care | Payer: Medicare Other | Source: Ambulatory Visit | Attending: Hematology and Oncology | Admitting: Hematology and Oncology

## 2021-07-30 ENCOUNTER — Other Ambulatory Visit: Payer: Self-pay

## 2021-07-30 DIAGNOSIS — Z170 Estrogen receptor positive status [ER+]: Secondary | ICD-10-CM | POA: Diagnosis not present

## 2021-07-30 DIAGNOSIS — Z5181 Encounter for therapeutic drug level monitoring: Secondary | ICD-10-CM | POA: Diagnosis not present

## 2021-07-30 DIAGNOSIS — C50412 Malignant neoplasm of upper-outer quadrant of left female breast: Secondary | ICD-10-CM | POA: Diagnosis not present

## 2021-07-30 DIAGNOSIS — Z79899 Other long term (current) drug therapy: Secondary | ICD-10-CM | POA: Diagnosis not present

## 2021-07-30 DIAGNOSIS — Z0189 Encounter for other specified special examinations: Secondary | ICD-10-CM

## 2021-07-30 LAB — ECHOCARDIOGRAM COMPLETE
AR max vel: 1.17 cm2
AV Area VTI: 1.08 cm2
AV Area mean vel: 1.15 cm2
AV Mean grad: 3 mmHg
AV Peak grad: 6.2 mmHg
Ao pk vel: 1.24 m/s
Area-P 1/2: 4.15 cm2
Calc EF: 59.6 %
S' Lateral: 1.9 cm
Single Plane A2C EF: 68.2 %
Single Plane A4C EF: 52.3 %

## 2021-07-30 NOTE — Progress Notes (Signed)
? ?  Echocardiogram ?2D Echocardiogram has been performed. ? ?April Rosales ?07/30/2021, 1:37 PM ?

## 2021-08-07 ENCOUNTER — Inpatient Hospital Stay: Payer: Medicare Other

## 2021-08-07 ENCOUNTER — Inpatient Hospital Stay: Payer: Medicare Other | Attending: Oncology | Admitting: Hematology and Oncology

## 2021-08-07 ENCOUNTER — Encounter: Payer: Self-pay | Admitting: Hematology and Oncology

## 2021-08-07 ENCOUNTER — Other Ambulatory Visit: Payer: Self-pay

## 2021-08-07 VITALS — BP 158/78 | HR 89 | Temp 98.1°F | Resp 17

## 2021-08-07 VITALS — BP 165/81 | HR 109 | Temp 98.1°F | Resp 16 | Ht 61.0 in | Wt 108.5 lb

## 2021-08-07 DIAGNOSIS — C50412 Malignant neoplasm of upper-outer quadrant of left female breast: Secondary | ICD-10-CM

## 2021-08-07 DIAGNOSIS — C50919 Malignant neoplasm of unspecified site of unspecified female breast: Secondary | ICD-10-CM

## 2021-08-07 DIAGNOSIS — C7951 Secondary malignant neoplasm of bone: Secondary | ICD-10-CM

## 2021-08-07 DIAGNOSIS — C787 Secondary malignant neoplasm of liver and intrahepatic bile duct: Secondary | ICD-10-CM

## 2021-08-07 DIAGNOSIS — C50912 Malignant neoplasm of unspecified site of left female breast: Secondary | ICD-10-CM | POA: Diagnosis not present

## 2021-08-07 DIAGNOSIS — Z5112 Encounter for antineoplastic immunotherapy: Secondary | ICD-10-CM | POA: Diagnosis not present

## 2021-08-07 DIAGNOSIS — Z170 Estrogen receptor positive status [ER+]: Secondary | ICD-10-CM | POA: Diagnosis not present

## 2021-08-07 LAB — CBC WITH DIFFERENTIAL/PLATELET
Abs Immature Granulocytes: 0.01 10*3/uL (ref 0.00–0.07)
Basophils Absolute: 0.1 10*3/uL (ref 0.0–0.1)
Basophils Relative: 1 %
Eosinophils Absolute: 0.1 10*3/uL (ref 0.0–0.5)
Eosinophils Relative: 3 %
HCT: 42.1 % (ref 36.0–46.0)
Hemoglobin: 14.8 g/dL (ref 12.0–15.0)
Immature Granulocytes: 0 %
Lymphocytes Relative: 25 %
Lymphs Abs: 1.2 10*3/uL (ref 0.7–4.0)
MCH: 32.7 pg (ref 26.0–34.0)
MCHC: 35.2 g/dL (ref 30.0–36.0)
MCV: 93.1 fL (ref 80.0–100.0)
Monocytes Absolute: 0.4 10*3/uL (ref 0.1–1.0)
Monocytes Relative: 9 %
Neutro Abs: 2.9 10*3/uL (ref 1.7–7.7)
Neutrophils Relative %: 62 %
Platelets: 190 10*3/uL (ref 150–400)
RBC: 4.52 MIL/uL (ref 3.87–5.11)
RDW: 15.2 % (ref 11.5–15.5)
WBC: 4.6 10*3/uL (ref 4.0–10.5)
nRBC: 0 % (ref 0.0–0.2)

## 2021-08-07 LAB — COMPREHENSIVE METABOLIC PANEL
ALT: 15 U/L (ref 0–44)
AST: 26 U/L (ref 15–41)
Albumin: 3.5 g/dL (ref 3.5–5.0)
Alkaline Phosphatase: 35 U/L — ABNORMAL LOW (ref 38–126)
Anion gap: 7 (ref 5–15)
BUN: 8 mg/dL (ref 8–23)
CO2: 28 mmol/L (ref 22–32)
Calcium: 9.6 mg/dL (ref 8.9–10.3)
Chloride: 106 mmol/L (ref 98–111)
Creatinine, Ser: 0.58 mg/dL (ref 0.44–1.00)
GFR, Estimated: >60 mL/min (ref >60)
Glucose, Bld: 98 mg/dL (ref 70–99)
Potassium: 3.8 mmol/L (ref 3.5–5.1)
Sodium: 141 mmol/L (ref 135–145)
Total Bilirubin: 0.5 mg/dL (ref 0.3–1.2)
Total Protein: 6.2 g/dL — ABNORMAL LOW (ref 6.5–8.1)

## 2021-08-07 MED ORDER — DEXTROSE 5 % IV SOLN: Freq: Once | INTRAVENOUS | Status: AC

## 2021-08-07 MED ORDER — FAM-TRASTUZUMAB DERUXTECAN-NXKI CHEMO 100 MG IV SOLR
5.4000 mg/kg | Freq: Once | INTRAVENOUS | Status: AC
  Administered 2021-08-07: 284 mg via INTRAVENOUS
  Filled 2021-08-07: qty 14.2

## 2021-08-07 MED ORDER — PALONOSETRON HCL INJECTION 0.25 MG/5ML
0.2500 mg | Freq: Once | INTRAVENOUS | Status: AC
  Administered 2021-08-07: 0.25 mg via INTRAVENOUS
  Filled 2021-08-07: qty 5

## 2021-08-07 MED ORDER — DIPHENHYDRAMINE HCL 25 MG PO CAPS
25.0000 mg | ORAL_CAPSULE | Freq: Once | ORAL | Status: AC
  Administered 2021-08-07: 25 mg via ORAL
  Filled 2021-08-07: qty 1

## 2021-08-07 MED ORDER — SODIUM CHLORIDE 0.9 % IV SOLN
10.0000 mg | Freq: Once | INTRAVENOUS | Status: AC
  Administered 2021-08-07: 10 mg via INTRAVENOUS
  Filled 2021-08-07: qty 10

## 2021-08-07 MED ORDER — ACETAMINOPHEN 325 MG PO TABS
650.0000 mg | ORAL_TABLET | Freq: Once | ORAL | Status: AC
  Administered 2021-08-07: 650 mg via ORAL
  Filled 2021-08-07: qty 2

## 2021-08-07 NOTE — Patient Instructions (Signed)
[facility]  Discharge Instructions: ?Thank you for choosing [facility] to provide your oncology and hematology care.  ? ?If you have a lab appointment with the Cancer Center, please go directly to the Cancer Center and check in at the registration area. ?  ?Wear comfortable clothing and clothing appropriate for easy access to any Portacath or PICC line.  ? ?We strive to give you quality time with your provider. You may need to reschedule your appointment if you arrive late (15 or more minutes).  Arriving late affects you and other patients whose appointments are after yours.  Also, if you miss three or more appointments without notifying the office, you may be dismissed from the clinic at the provider?s discretion.    ?  ?For prescription refill requests, have your pharmacy contact our office and allow 72 hours for refills to be completed.   ? ?Today you received the following chemotherapy and/or immunotherapy agents: Enhertu.    ?  ?To help prevent nausea and vomiting after your treatment, we encourage you to take your nausea medication as directed. ? ?BELOW ARE SYMPTOMS THAT SHOULD BE REPORTED IMMEDIATELY: ?*FEVER GREATER THAN 100.4 F (38 ?C) OR HIGHER ?*CHILLS OR SWEATING ?*NAUSEA AND VOMITING THAT IS NOT CONTROLLED WITH YOUR NAUSEA MEDICATION ?*UNUSUAL SHORTNESS OF BREATH ?*UNUSUAL BRUISING OR BLEEDING ?*URINARY PROBLEMS (pain or burning when urinating, or frequent urination) ?*BOWEL PROBLEMS (unusual diarrhea, constipation, pain near the anus) ?TENDERNESS IN MOUTH AND THROAT WITH OR WITHOUT PRESENCE OF ULCERS (sore throat, sores in mouth, or a toothache) ?UNUSUAL RASH, SWELLING OR PAIN  ?UNUSUAL VAGINAL DISCHARGE OR ITCHING  ? ?Items with * indicate a potential emergency and should be followed up as soon as possible or go to the Emergency Department if any problems should occur. ? ?Please show the CHEMOTHERAPY ALERT CARD or IMMUNOTHERAPY ALERT CARD at check-in to  the Emergency Department and triage nurse. ? ?Should you have questions after your visit or need to cancel or reschedule your appointment, please contact [facility]  Dept: [phone]  and follow the prompts.  Office hours are 8:00 a.m. to 4:30 p.m. Monday - Friday. Please note that voicemails left after 4:00 p.m. may not be returned until the following business day.  We are closed weekends and major holidays. You have access to a nurse at all times for urgent questions. Please call the main number to the clinic Dept: [phone] and follow the prompts. ? ? ?For any non-urgent questions, you may also contact your provider using MyChart. We now offer e-Visits for anyone 18 and older to request care online for non-urgent symptoms. For details visit [email or web address]. ?  ?Also download the MyChart app! Go to the app store, search "MyChart", open the app, select [facility], and log in with your MyChart username and password. ? ?Due to Covid, a mask is required upon entering the hospital/clinic. If you do not have a mask, one will be given to you upon arrival. For doctor visits, patients may have 1 support person aged 18 or older with them. For treatment visits, patients cannot have anyone with them due to current Covid guidelines and our immunocompromised population.  ? ?

## 2021-08-07 NOTE — Progress Notes (Signed)
?[place]  ?[phone] [phone]  ? ? ?ID: April Rosales DOB: 13-Aug-1940  MR#: [id number]  HDQ#:[id number] ? ?Patient Care Team: ?[name], MD as PCP - General (Internal Medicine) ?Magrinat, [name], MD (Inactive) as Consulting Physician (Oncology) ?[name], MD as Consulting Physician (Orthopedic Surgery) ?[name], MD as Consulting Physician (General Surgery) ?[name], MD as Consulting Physician (Cardiology) ?[name], RPH-CPP (Pharmacist) ? ?OTHER: April Rosales, DDS ? ? ?CHIEF COMPLAINT: Stage IV estrogen receptor positive breast cancer ? ?CURRENT TREATMENT: Enhertu; denosumab/Prolia every 6 months; ? ? ?INTERVAL HISTORY: ? ?April Rosales is doing well.  She is here for follow-up since her last visit.  She has tolerated Enhertu well except for a week of fatigue.  She denies any ongoing nausea, vomiting or diarrhea.  No change in breathing. ?No new neurological complaints.  Rest of the pertinent 10 point ROS reviewed and negative. ? ?REVIEW OF SYSTEMS:  ? ? COVID 19 VACCINATION STATUS: [place] x2, most recently 11/2019, no boosters as of September 2022 ? ? ?BREAST CANCER HISTORY: ?From the original intake note: ? ? April Rosales underwent right lumpectomy in 1985 at [facility] for what sounds like a stage I breast cancer. She tells me she had more than 30 lymph nodes removed from her right axilla and all of them were clear. She received  adjuvant radiation but no systemic treatment. ? ?The patient had recently refused mammography with the last mammogram I can find dating back to August 2010. ? ?More recently the patient presented with left scapular pain radiating down the left arm.  She was evaluated by Dr. [name], who obtained a chest x-ray showing a possible abnormality at T3. He then set up the patient for an MRI of the thoracic spine performed 08/16/2014.  This showed multiple compression fractures  (a similar picture had been  noted on lumbar MRI 10/09/2011 ). However at T3 they noted tumor in the vertebral body extending into the left pedicle and into the lateral epidural space, displacing the cord to the right. There was no evidence of cord compression or cord signal abnormality. There were no other areas of tumor identified in the thoracic spine.        ? ?The patient was then referred to Dr. Hal Neer who on 08/24/2014 set April Rosales up for CT scans of the chest, abdomen and pelvis. There was a dense mass in the upper inner quadrant of the left breast measuring 1.6 cm. There was a 1.2 cm nodule in the minor fissure of the right lung and some evidence of right lung fibrosis at the site of the prior radiation port.  There was also a thyroid mass measuring 2 cm. However there were no parenchymal lung or liver lesions. Incidental meningoceles were noted as well as sclerosis of the fifth and sixth ribs which were felt to be likely posttraumatic.  ? ?On 08/29/2014 the patient underwent bilateral diagnostic mammography with tomosynthesis and left breast ultrasonography.  There were postsurgical changes in the upper right breast. In the left breast there was an irregular mass measuring 2.3 cm in the upper inner quadrant. This was palpable. Ultrasound showed this to be hypoechoic and to measure 2.0 cm. There were adjacent areas of nodularity. There was no definite lymphadenopathy in the left axilla. ? ?Biopsy of this breast mass 08/29/2014 showed an invasive adenocarcinoma with both ductal and lobular features (there was strong diffuse E-cadherin expression as well as areas with total absence of  E-cadherin expression), with the preliminary prognostic profile showing strong estrogen positivity, very weak to near absent progesterone positivity, an MIB-1 of approximately 40%, and HER-2 equivocal ? ?The patient's subsequent history is as detailed below. ? ? ?PAST MEDICAL HISTORY: ?Past Medical History:  ?Diagnosis Date  ? Arthritis   ? Breast cancer  (April Rosales) 1985/2016  ? takes [name] daily  ? Chronic back pain   ? stenosis/listhesis  ? Family history of ovarian cancer   ? Osteoporosis   ? takes Vit D  ? Radiation 11/23/14-12/11/14  ? 30 Gy T1-T5   ? ? ?PAST SURGICAL HISTORY: ?Past Surgical History:  ?Procedure Laterality Date  ? ABDOMINAL HYSTERECTOMY    ? 1980  ? APPENDECTOMY  1977  ? APPLICATION OF INTRAOPERATIVE CT SCAN N/A 10/20/2014  ? Procedure: APPLICATION OF INTRAOPERATIVE CAT SCAN;  Surgeon: [name], MD;  Location: [place] [facility];  Service: Neurosurgery;  Laterality: N/A;  ? BREAST LUMPECTOMY WITH RADIOACTIVE SEED LOCALIZATION Left 05/03/2018  ? Procedure: LEFT BREAST LUMPECTOMY WITH BRACKETED RADIOACTIVE SEED LOCALIZATION;  Surgeon: [name], MD;  Location: [facility];  Service: General;  Laterality: Left;  ? BREAST SURGERY Right 1985  ? THYROID CYST EXCISION  1967  ? ? ?FAMILY HISTORY ?Family History  ?Problem Relation Age of Onset  ? Heart disease Mother   ? Hypertension Mother   ? Heart disease Father   ? Diabetes Father   ? Breast cancer Paternal Aunt   ?     4 paternal aunts with breast cancer over 67  ? Prostate cancer Paternal Uncle   ? Stroke Paternal Grandfather   ? Ovarian cancer Paternal Aunt   ? Huntington's disease Other   ?     Nephew, inherited from his father  ?The patient's father died from a heart attack at the age of 28. He had 9 sisters. 3 of those sisters had breast cancer, all in a menopausal setting. Another sister had ovarian cancer. One of the paternal uncles had cancer of the colon "and back".  The patient's mother died at the age of 20. She was found to have breast cancer shortly before dying , during her final hospitalization. ? ? ?GYNECOLOGIC HISTORY:  ? ?No LMP recorded. Patient has had a hysterectomy. ? Menarche age 17, first live birth age 31, the patient is GX P1. She underwent hysterectomy in 1980. She thinks the ovaries were removed, but the CT scan obtained 08/24/2014 showed a definite right  ovary. The left ovary may have been removed. She did not take hormone replacement after the hysterectomy. ? ? ?SOCIAL HISTORY:  ? 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 She is divorced, lives alone, with 2 cats. Her son Bethann Berkshire lives in [place] where he works in Engineer, technical sales. He has 4 children of his own. The patient is a Psychologist, forensic. ?  ?ADVANCED DIRECTIVES:  In place. The patient has named her sister Pleas Koch [phone]) and her friend Janina Mayo [phone]) as joint healthcare powers of attorney ? ? ?HEALTH MAINTENANCE: ?Social History  ? ?Tobacco Use  ? Smoking status: Former  ?  Packs/day: 0.50  ?  Years: 10.00  ?  Pack years: 5.00  ?  Types: Cigarettes  ?  Quit date: 05/03/1970  ?  Years since quitting: 51.2  ? Smokeless tobacco: Former  ? Tobacco comments:  ?  quit smoking 1970's  ?Substance Use Topics  ? Alcohol use: Yes  ?  Alcohol/week: 0.0 standard drinks  ?  Comment: Rare  ? Drug use:  No  ? ? ? Colonoscopy: never ? PAP: status post hysterectomy ? Bone density: 09/12/2016 April Rosales/ T score of -4.8 osteoporosis ? Lipid panel: ? ?Allergies  ?Allergen Reactions  ? Ativan [Lorazepam]   ?  Made her crazy  ? Dilaudid [Hydromorphone Hcl]   ?  Doesn't want  ? Haldol [Haloperidol Lactate]   ?  Made her crazy  ? Xarelto [Rivaroxaban]   ?  Pt reports debilitating dizziness.  ? ? ?Current Outpatient Medications  ?Medication Sig Dispense Refill  ? apixaban (ELIQUIS) 5 MG TABS tablet Take 1 tablet (5 mg total) by mouth 2 (two) times daily. 60 tablet 1  ? Cholecalciferol (VITAMIN D3 ADULT GUMMIES PO) Take 2,000 Units by mouth daily.     ? Cyanocobalamin (VITAMIN B-12 PO) Take by mouth daily. Takes every other day    ? dexamethasone (DECADRON) 4 MG tablet Take 2 tablets (8 mg total) by mouth daily. Start the day after chemotherapy for 2 days. 30 tablet 1  ? Iron-Vitamins (GERITOL PO) Take by mouth daily.    ? OVER THE COUNTER MEDICATION OTC Apple Cider Vinegar 1 capsule daily.    ? prochlorperazine (COMPAZINE)  10 MG tablet Take 1 tablet (10 mg total) by mouth every 6 (six) hours as needed (Nausea or vomiting). 30 tablet 1  ? ?No current facility-administered medications for this visit.  ? ? ?OBJECTIVE:  ? ?Vitals:  ?

## 2021-08-28 ENCOUNTER — Other Ambulatory Visit: Payer: Self-pay

## 2021-08-28 ENCOUNTER — Inpatient Hospital Stay (HOSPITAL_BASED_OUTPATIENT_CLINIC_OR_DEPARTMENT_OTHER): Payer: Medicare Other | Admitting: Hematology and Oncology

## 2021-08-28 ENCOUNTER — Inpatient Hospital Stay: Payer: Medicare Other | Attending: Oncology

## 2021-08-28 ENCOUNTER — Inpatient Hospital Stay: Payer: Medicare Other

## 2021-08-28 ENCOUNTER — Encounter: Payer: Self-pay | Admitting: Hematology and Oncology

## 2021-08-28 VITALS — HR 96

## 2021-08-28 VITALS — BP 157/89 | HR 113 | Temp 97.9°F | Resp 16 | Ht 61.0 in | Wt 109.6 lb

## 2021-08-28 DIAGNOSIS — Z170 Estrogen receptor positive status [ER+]: Secondary | ICD-10-CM

## 2021-08-28 DIAGNOSIS — C50919 Malignant neoplasm of unspecified site of unspecified female breast: Secondary | ICD-10-CM

## 2021-08-28 DIAGNOSIS — C7951 Secondary malignant neoplasm of bone: Secondary | ICD-10-CM | POA: Diagnosis not present

## 2021-08-28 DIAGNOSIS — C50412 Malignant neoplasm of upper-outer quadrant of left female breast: Secondary | ICD-10-CM

## 2021-08-28 DIAGNOSIS — Z5181 Encounter for therapeutic drug level monitoring: Secondary | ICD-10-CM | POA: Diagnosis not present

## 2021-08-28 DIAGNOSIS — Z5111 Encounter for antineoplastic chemotherapy: Secondary | ICD-10-CM | POA: Diagnosis not present

## 2021-08-28 DIAGNOSIS — Z5112 Encounter for antineoplastic immunotherapy: Secondary | ICD-10-CM | POA: Insufficient documentation

## 2021-08-28 DIAGNOSIS — M810 Age-related osteoporosis without current pathological fracture: Secondary | ICD-10-CM

## 2021-08-28 DIAGNOSIS — C50912 Malignant neoplasm of unspecified site of left female breast: Secondary | ICD-10-CM | POA: Diagnosis not present

## 2021-08-28 DIAGNOSIS — C787 Secondary malignant neoplasm of liver and intrahepatic bile duct: Secondary | ICD-10-CM | POA: Insufficient documentation

## 2021-08-28 DIAGNOSIS — Z87891 Personal history of nicotine dependence: Secondary | ICD-10-CM | POA: Diagnosis not present

## 2021-08-28 DIAGNOSIS — Z79899 Other long term (current) drug therapy: Secondary | ICD-10-CM | POA: Diagnosis not present

## 2021-08-28 LAB — CBC WITH DIFFERENTIAL (CANCER CENTER ONLY)
Abs Immature Granulocytes: 0.01 10*3/uL (ref 0.00–0.07)
Basophils Absolute: 0.1 10*3/uL (ref 0.0–0.1)
Basophils Relative: 1 %
Eosinophils Absolute: 0.2 10*3/uL (ref 0.0–0.5)
Eosinophils Relative: 5 %
HCT: 38.9 % (ref 36.0–46.0)
Hemoglobin: 13.2 g/dL (ref 12.0–15.0)
Immature Granulocytes: 0 %
Lymphocytes Relative: 26 %
Lymphs Abs: 1.2 10*3/uL (ref 0.7–4.0)
MCH: 32.4 pg (ref 26.0–34.0)
MCHC: 33.9 g/dL (ref 30.0–36.0)
MCV: 95.6 fL (ref 80.0–100.0)
Monocytes Absolute: 0.6 10*3/uL (ref 0.1–1.0)
Monocytes Relative: 13 %
Neutro Abs: 2.4 10*3/uL (ref 1.7–7.7)
Neutrophils Relative %: 55 %
Platelet Count: 296 10*3/uL (ref 150–400)
RBC: 4.07 MIL/uL (ref 3.87–5.11)
RDW: 15.3 % (ref 11.5–15.5)
WBC Count: 4.4 10*3/uL (ref 4.0–10.5)
nRBC: 0 % (ref 0.0–0.2)

## 2021-08-28 LAB — CMP (CANCER CENTER ONLY)
ALT: 12 U/L (ref 0–44)
AST: 21 U/L (ref 15–41)
Albumin: 3.5 g/dL (ref 3.5–5.0)
Alkaline Phosphatase: 39 U/L (ref 38–126)
Anion gap: 5 (ref 5–15)
BUN: 7 mg/dL — ABNORMAL LOW (ref 8–23)
CO2: 29 mmol/L (ref 22–32)
Calcium: 9.7 mg/dL (ref 8.9–10.3)
Chloride: 105 mmol/L (ref 98–111)
Creatinine: 0.55 mg/dL (ref 0.44–1.00)
GFR, Estimated: >60 mL/min (ref >60)
Glucose, Bld: 102 mg/dL — ABNORMAL HIGH (ref 70–99)
Potassium: 3.8 mmol/L (ref 3.5–5.1)
Sodium: 139 mmol/L (ref 135–145)
Total Bilirubin: 0.4 mg/dL (ref 0.3–1.2)
Total Protein: 6.1 g/dL — ABNORMAL LOW (ref 6.5–8.1)

## 2021-08-28 MED ORDER — DEXTROSE 5 % IV SOLN: Freq: Once | INTRAVENOUS | Status: AC

## 2021-08-28 MED ORDER — ACETAMINOPHEN 325 MG PO TABS
650.0000 mg | ORAL_TABLET | Freq: Once | ORAL | Status: AC
  Administered 2021-08-28: 650 mg via ORAL
  Filled 2021-08-28: qty 2

## 2021-08-28 MED ORDER — DIPHENHYDRAMINE HCL 25 MG PO CAPS
25.0000 mg | ORAL_CAPSULE | Freq: Once | ORAL | Status: AC
  Administered 2021-08-28: 25 mg via ORAL
  Filled 2021-08-28: qty 1

## 2021-08-28 MED ORDER — SODIUM CHLORIDE 0.9 % IV SOLN
10.0000 mg | Freq: Once | INTRAVENOUS | Status: AC
  Administered 2021-08-28: 10 mg via INTRAVENOUS
  Filled 2021-08-28: qty 10

## 2021-08-28 MED ORDER — DENOSUMAB 60 MG/ML ~~LOC~~ SOSY
60.0000 mg | PREFILLED_SYRINGE | Freq: Once | SUBCUTANEOUS | Status: AC
  Administered 2021-08-28: 60 mg via SUBCUTANEOUS
  Filled 2021-08-28: qty 1

## 2021-08-28 MED ORDER — FAM-TRASTUZUMAB DERUXTECAN-NXKI CHEMO 100 MG IV SOLR
4.4000 mg/kg | Freq: Once | INTRAVENOUS | Status: AC
  Administered 2021-08-28: 232 mg via INTRAVENOUS
  Filled 2021-08-28: qty 11.6

## 2021-08-28 MED ORDER — PALONOSETRON HCL INJECTION 0.25 MG/5ML
0.2500 mg | Freq: Once | INTRAVENOUS | Status: AC
  Administered 2021-08-28: 0.25 mg via INTRAVENOUS
  Filled 2021-08-28: qty 5

## 2021-08-28 NOTE — Patient Instructions (Signed)
[facility]  Discharge Instructions: ?Thank you for choosing [facility] to provide your oncology and hematology care.  ? ?If you have a lab appointment with the [facility], please go directly to the [facility] and check in at the registration area. ?  ?Wear comfortable clothing and clothing appropriate for easy access to any Portacath or PICC line.  ? ?We strive to give you quality time with your provider. You may need to reschedule your appointment if you arrive late (15 or more minutes).  Arriving late affects you and other patients whose appointments are after yours.  Also, if you miss three or more appointments without notifying the office, you may be dismissed from the clinic at the provider?s discretion.    ?  ?For prescription refill requests, have your pharmacy contact our office and allow 72 hours for refills to be completed.   ? ?Today you received the following chemotherapy and/or immunotherapy agents enhertu    ?  ?To help prevent nausea and vomiting after your treatment, we encourage you to take your nausea medication as directed. ? ?BELOW ARE SYMPTOMS THAT SHOULD BE REPORTED IMMEDIATELY: ?*FEVER GREATER THAN 100.4 F (38 ?C) OR HIGHER ?*CHILLS OR SWEATING ?*NAUSEA AND VOMITING THAT IS NOT CONTROLLED WITH YOUR NAUSEA MEDICATION ?*UNUSUAL SHORTNESS OF BREATH ?*UNUSUAL BRUISING OR BLEEDING ?*URINARY PROBLEMS (pain or burning when urinating, or frequent urination) ?*BOWEL PROBLEMS (unusual diarrhea, constipation, pain near the anus) ?TENDERNESS IN MOUTH AND THROAT WITH OR WITHOUT PRESENCE OF ULCERS (sore throat, sores in mouth, or a toothache) ?UNUSUAL RASH, SWELLING OR PAIN  ?UNUSUAL VAGINAL DISCHARGE OR ITCHING  ? ?Items with * indicate a potential emergency and should be followed up as soon as possible or go to the Emergency Department if any problems should occur. ? ?Please show the CHEMOTHERAPY ALERT CARD or IMMUNOTHERAPY ALERT CARD at check-in to the  Emergency Department and triage nurse. ? ?Should you have questions after your visit or need to cancel or reschedule your appointment, please contact [facility]  Dept: [phone]  and follow the prompts.  Office hours are 8:00 a.m. to 4:30 p.m. Monday - Friday. Please note that voicemails left after 4:00 p.m. may not be returned until the following business day.  We are closed weekends and major holidays. You have access to a nurse at all times for urgent questions. Please call the main number to the clinic Dept: [phone] and follow the prompts. ? ? ?For any non-urgent questions, you may also contact your provider using MyChart. We now offer e-Visits for anyone 28 and older to request care online for non-urgent symptoms. For details visit [email or web address]. ?  ?Also download the MyChart app! Go to the app store, search "MyChart", open the app, select [facility], and log in with your MyChart username and password. ? ?Due to Covid, a mask is required upon entering the hospital/clinic. If you do not have a mask, one will be given to you upon arrival. For doctor visits, patients may have 1 support person aged 66 or older with them. For treatment visits, patients cannot have anyone with them due to current Covid guidelines and our immunocompromised population.  ? ?

## 2021-08-28 NOTE — Progress Notes (Signed)
?[place]  ?Telephone:(336) [phone] [phone]  ? ? ?ID: [name] DOB: 1940-10-10  MR#: [id number]  YTK#:[id number] ? ?Patient Care Team: ?[name], MD as PCP - General (Internal Medicine) ?Magrinat, [name], MD (Inactive) as Consulting Physician (Oncology) ?[name], MD as Consulting Physician (Orthopedic Surgery) ?[name], MD as Consulting Physician (General Surgery) ?[name], MD as Consulting Physician (Cardiology) ?[name], RPH-CPP (Pharmacist) ? ?OTHER: [name], DDS ? ? ?CHIEF COMPLAINT: Stage IV estrogen receptor positive breast cancer ? ?CURRENT TREATMENT: Enhertu; denosumab/Prolia every 6 months; ? ? ?INTERVAL HISTORY: ? ?Ms Gullatt is doing well.  She is here for follow-up since her last visit.   ?She continues to tolerate Enhertu very well.   ?She denies any new complaints today except for a week of fatigue after Enhertu and some constipation.   ?She feels bloated and cannot eat large meals.  No diarrhea.  No new neurological complaints.  ?Rest of the pertinent 10 point ROS reviewed and negative. ? ?REVIEW OF SYSTEMS:  ? ? COVID 19 VACCINATION STATUS: [place] x2, most recently 11/2019, no boosters as of September 2022 ? ? ?BREAST CANCER HISTORY: ?From the original intake note: ? ? Shakena underwent right lumpectomy in 1985 at [facility] for what sounds like a stage I breast cancer. She tells me she had more than 30 lymph nodes removed from her right axilla and all of them were clear. She received  adjuvant radiation but no systemic treatment. ? ?The patient had recently refused mammography with the last mammogram I can find dating back to August 2010. ? ?More recently the patient presented with left scapular pain radiating down the left arm.  She was evaluated by Dr. [name], who obtained a chest x-ray showing a possible abnormality at T3. He then set up the patient for an MRI of the thoracic spine performed 08/16/2014.   This showed multiple compression fractures  (a similar picture had been noted on lumbar MRI 10/09/2011 ). However at T3 they noted tumor in the vertebral body extending into the left pedicle and into the lateral epidural space, displacing the cord to the right. There was no evidence of cord compression or cord signal abnormality. There were no other areas of tumor identified in the thoracic spine.        ? ?The patient was then referred to Dr. Hal Neer who on 08/24/2014 set [name] up for CT scans of the chest, abdomen and pelvis. There was a dense mass in the upper inner quadrant of the left breast measuring 1.6 cm. There was a 1.2 cm nodule in the minor fissure of the right lung and some evidence of right lung fibrosis at the site of the prior radiation port.  There was also a thyroid mass measuring 2 cm. However there were no parenchymal lung or liver lesions. Incidental meningoceles were noted as well as sclerosis of the fifth and sixth ribs which were felt to be likely posttraumatic.  ? ?On 08/29/2014 the patient underwent bilateral diagnostic mammography with tomosynthesis and left breast ultrasonography.  There were postsurgical changes in the upper right breast. In the left breast there was an irregular mass measuring 2.3 cm in the upper inner quadrant. This was palpable. Ultrasound showed this to be hypoechoic and to measure 2.0 cm. There were adjacent areas of nodularity. There was no definite lymphadenopathy in the left axilla. ? ?Biopsy of this breast mass 08/29/2014 showed an invasive adenocarcinoma with both ductal and  lobular features (there was strong diffuse E-cadherin expression as well as areas with total absence of E-cadherin expression), with the preliminary prognostic profile showing strong estrogen positivity, very weak to near absent progesterone positivity, an MIB-1 of approximately 40%, and HER-2 equivocal ? ?The patient's subsequent history is as detailed below. ? ? ?PAST MEDICAL  HISTORY: ?Past Medical History:  ?Diagnosis Date  ? Arthritis   ? Breast cancer ([place]) 1985/2016  ? takes [name] daily  ? Chronic back pain   ? stenosis/listhesis  ? Family history of ovarian cancer   ? Osteoporosis   ? takes Vit D  ? Radiation 11/23/14-12/11/14  ? 30 Gy T1-T5   ? ? ?PAST SURGICAL HISTORY: ?Past Surgical History:  ?Procedure Laterality Date  ? ABDOMINAL HYSTERECTOMY    ? 1980  ? APPENDECTOMY  1977  ? APPLICATION OF INTRAOPERATIVE CT SCAN N/A 10/20/2014  ? Procedure: APPLICATION OF INTRAOPERATIVE CAT SCAN;  Surgeon: [name], MD;  Location: [facility];  Service: Neurosurgery;  Laterality: N/A;  ? BREAST LUMPECTOMY WITH RADIOACTIVE SEED LOCALIZATION Left 05/03/2018  ? Procedure: LEFT BREAST LUMPECTOMY WITH BRACKETED RADIOACTIVE SEED LOCALIZATION;  Surgeon: [name], MD;  Location: [facility];  Service: General;  Laterality: Left;  ? BREAST SURGERY Right 1985  ? THYROID CYST EXCISION  1967  ? ? ?FAMILY HISTORY ?Family History  ?Problem Relation Age of Onset  ? Heart disease Mother   ? Hypertension Mother   ? Heart disease Father   ? Diabetes Father   ? Breast cancer Paternal Aunt   ?     4 paternal aunts with breast cancer over 33  ? Prostate cancer Paternal Uncle   ? Stroke Paternal Grandfather   ? Ovarian cancer Paternal Aunt   ? Huntington's disease Other   ?     Nephew, inherited from his father  ?The patient's father died from a heart attack at the age of 53. He had 9 sisters. 3 of those sisters had breast cancer, all in a menopausal setting. Another sister had ovarian cancer. One of the paternal uncles had cancer of the colon "and back".  The patient's mother died at the age of 10. She was found to have breast cancer shortly before dying , during her final hospitalization. ? ? ?GYNECOLOGIC HISTORY:  ? ?No LMP recorded. Patient has had a hysterectomy. ? Menarche age 43, first live birth age 12, the patient is GX P1. She underwent hysterectomy in 1980. She thinks the  ovaries were removed, but the CT scan obtained 08/24/2014 showed a definite right ovary. The left ovary may have been removed. She did not take hormone replacement after the hysterectomy. ? ? ?SOCIAL HISTORY:  ? 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 Her son Bethann Berkshire lives in [place] where he works in Engineer, technical sales. He has 4 children of his own. The patient is a Psychologist, forensic. ?  ?ADVANCED DIRECTIVES:  In place. The patient has named her sister Pleas Koch [phone]) and her friend Janina Mayo [phone]) as joint healthcare powers of attorney ? ? ?HEALTH MAINTENANCE: ?Social History  ? ?Tobacco Use  ? Smoking status: Former  ?  Packs/day: 0.50  ?  Years: 10.00  ?  Pack years: 5.00  ?  Types: Cigarettes  ?  Quit date: 05/03/1970  ?  Years since quitting: 51.3  ? Smokeless tobacco: Former  ? Tobacco comments:  ?  quit smoking 1970's  ?Substance Use Topics  ? Alcohol use: Yes  ?  Alcohol/week: 0.0 standard drinks  ?  Comment: Rare  ? Drug use: No  ? ? ? Colonoscopy: never ? PAP: status post hysterectomy ? Bone density: 09/12/2016 [facility]/ T score of -4.8 osteoporosis ? Lipid panel: ? ?Allergies  ?Allergen Reactions  ? Ativan [Lorazepam]   ?  Made her crazy  ? Dilaudid [Hydromorphone Hcl]   ?  Doesn't want  ? Haldol [Haloperidol Lactate]   ?  Made her crazy  ? Xarelto [Rivaroxaban]   ?  Pt reports debilitating dizziness.  ? ? ?Current Outpatient Medications  ?Medication Sig Dispense Refill  ? apixaban (ELIQUIS) 5 MG TABS tablet Take 1 tablet (5 mg total) by mouth 2 (two) times daily. 60 tablet 1  ? Cholecalciferol (VITAMIN D3 ADULT GUMMIES PO) Take 2,000 Units by mouth daily.     ? Cyanocobalamin (VITAMIN B-12 PO) Take by mouth daily. Takes every other day    ? dexamethasone (DECADRON) 4 MG tablet Take 2 tablets (8 mg total) by mouth daily. Start the day after chemotherapy for 2 days. 30 tablet 1  ? Iron-Vitamins (GERITOL PO) Take by mouth daily.    ? OVER THE COUNTER  MEDICATION OTC Apple Cider Vinegar 1 capsule daily.    ? prochlorperazine (COMPAZINE) 10 MG tablet Take 1 tablet (10 mg total) by mouth every 6 (six) hours as needed (Nausea or vomiting). 30 tablet 1  ? ?No current facili

## 2021-09-09 ENCOUNTER — Inpatient Hospital Stay: Payer: Medicare Other

## 2021-09-18 ENCOUNTER — Other Ambulatory Visit: Payer: Self-pay | Admitting: Hematology and Oncology

## 2021-09-18 ENCOUNTER — Other Ambulatory Visit: Payer: Self-pay

## 2021-09-18 ENCOUNTER — Inpatient Hospital Stay: Payer: Medicare Other

## 2021-09-18 ENCOUNTER — Encounter: Payer: Self-pay | Admitting: Hematology and Oncology

## 2021-09-18 ENCOUNTER — Other Ambulatory Visit: Payer: Self-pay | Admitting: *Deleted

## 2021-09-18 ENCOUNTER — Inpatient Hospital Stay (HOSPITAL_BASED_OUTPATIENT_CLINIC_OR_DEPARTMENT_OTHER): Payer: Medicare Other | Admitting: Hematology and Oncology

## 2021-09-18 DIAGNOSIS — C7951 Secondary malignant neoplasm of bone: Secondary | ICD-10-CM

## 2021-09-18 DIAGNOSIS — C50919 Malignant neoplasm of unspecified site of unspecified female breast: Secondary | ICD-10-CM

## 2021-09-18 DIAGNOSIS — Z5181 Encounter for therapeutic drug level monitoring: Secondary | ICD-10-CM | POA: Diagnosis not present

## 2021-09-18 DIAGNOSIS — Z170 Estrogen receptor positive status [ER+]: Secondary | ICD-10-CM

## 2021-09-18 DIAGNOSIS — I82411 Acute embolism and thrombosis of right femoral vein: Secondary | ICD-10-CM | POA: Insufficient documentation

## 2021-09-18 DIAGNOSIS — C50412 Malignant neoplasm of upper-outer quadrant of left female breast: Secondary | ICD-10-CM | POA: Diagnosis not present

## 2021-09-18 DIAGNOSIS — C50912 Malignant neoplasm of unspecified site of left female breast: Secondary | ICD-10-CM | POA: Diagnosis not present

## 2021-09-18 DIAGNOSIS — Z79899 Other long term (current) drug therapy: Secondary | ICD-10-CM | POA: Diagnosis not present

## 2021-09-18 DIAGNOSIS — Z5112 Encounter for antineoplastic immunotherapy: Secondary | ICD-10-CM | POA: Diagnosis not present

## 2021-09-18 DIAGNOSIS — Z87891 Personal history of nicotine dependence: Secondary | ICD-10-CM | POA: Diagnosis not present

## 2021-09-18 DIAGNOSIS — C787 Secondary malignant neoplasm of liver and intrahepatic bile duct: Secondary | ICD-10-CM | POA: Diagnosis not present

## 2021-09-18 LAB — CBC WITH DIFFERENTIAL (CANCER CENTER ONLY)
Abs Immature Granulocytes: 0.01 10*3/uL (ref 0.00–0.07)
Basophils Absolute: 0.1 10*3/uL (ref 0.0–0.1)
Basophils Relative: 1 %
Eosinophils Absolute: 0.2 10*3/uL (ref 0.0–0.5)
Eosinophils Relative: 4 %
HCT: 41.4 % (ref 36.0–46.0)
Hemoglobin: 14 g/dL (ref 12.0–15.0)
Immature Granulocytes: 0 %
Lymphocytes Relative: 31 %
Lymphs Abs: 1.2 10*3/uL (ref 0.7–4.0)
MCH: 32.7 pg (ref 26.0–34.0)
MCHC: 33.8 g/dL (ref 30.0–36.0)
MCV: 96.7 fL (ref 80.0–100.0)
Monocytes Absolute: 0.5 10*3/uL (ref 0.1–1.0)
Monocytes Relative: 12 %
Neutro Abs: 2.1 10*3/uL (ref 1.7–7.7)
Neutrophils Relative %: 52 %
Platelet Count: 283 10*3/uL (ref 150–400)
RBC: 4.28 MIL/uL (ref 3.87–5.11)
RDW: 15.2 % (ref 11.5–15.5)
WBC Count: 4 10*3/uL (ref 4.0–10.5)
nRBC: 0 % (ref 0.0–0.2)

## 2021-09-18 LAB — CMP (CANCER CENTER ONLY)
ALT: 13 U/L (ref 0–44)
AST: 22 U/L (ref 15–41)
Albumin: 3.6 g/dL (ref 3.5–5.0)
Alkaline Phosphatase: 39 U/L (ref 38–126)
Anion gap: 3 — ABNORMAL LOW (ref 5–15)
BUN: 8 mg/dL (ref 8–23)
CO2: 29 mmol/L (ref 22–32)
Calcium: 9.7 mg/dL (ref 8.9–10.3)
Chloride: 109 mmol/L (ref 98–111)
Creatinine: 0.51 mg/dL (ref 0.44–1.00)
GFR, Estimated: >60 mL/min (ref >60)
Glucose, Bld: 114 mg/dL — ABNORMAL HIGH (ref 70–99)
Potassium: 4 mmol/L (ref 3.5–5.1)
Sodium: 141 mmol/L (ref 135–145)
Total Bilirubin: 0.3 mg/dL (ref 0.3–1.2)
Total Protein: 6.2 g/dL — ABNORMAL LOW (ref 6.5–8.1)

## 2021-09-18 MED ORDER — FAM-TRASTUZUMAB DERUXTECAN-NXKI CHEMO 100 MG IV SOLR
4.4000 mg/kg | Freq: Once | INTRAVENOUS | Status: AC
  Administered 2021-09-18: 232 mg via INTRAVENOUS
  Filled 2021-09-18: qty 11.6

## 2021-09-18 MED ORDER — SODIUM CHLORIDE 0.9 % IV SOLN
10.0000 mg | Freq: Once | INTRAVENOUS | Status: AC
  Administered 2021-09-18: 10 mg via INTRAVENOUS
  Filled 2021-09-18: qty 10

## 2021-09-18 MED ORDER — TRAMADOL HCL 50 MG PO TABS: 50.0000 mg | ORAL_TABLET | Freq: Every day | ORAL | 0 refills | Status: DC | PRN

## 2021-09-18 MED ORDER — DEXTROSE 5 % IV SOLN: Freq: Once | INTRAVENOUS | Status: AC

## 2021-09-18 MED ORDER — ACETAMINOPHEN 325 MG PO TABS
650.0000 mg | ORAL_TABLET | Freq: Once | ORAL | Status: AC
  Administered 2021-09-18: 650 mg via ORAL
  Filled 2021-09-18: qty 2

## 2021-09-18 MED ORDER — PALONOSETRON HCL INJECTION 0.25 MG/5ML
0.2500 mg | Freq: Once | INTRAVENOUS | Status: AC
  Administered 2021-09-18: 0.25 mg via INTRAVENOUS
  Filled 2021-09-18: qty 5

## 2021-09-18 MED ORDER — DIPHENHYDRAMINE HCL 25 MG PO CAPS
25.0000 mg | ORAL_CAPSULE | Freq: Once | ORAL | Status: AC
  Administered 2021-09-18: 25 mg via ORAL
  Filled 2021-09-18: qty 1

## 2021-09-18 NOTE — Assessment & Plan Note (Addendum)
81 y.o.  woman with stage IV left breast cancer involving liver, bone currently on Enhertu who is here for follow up. Please review history above. ?She continues to tolerate Enhertu very well except for some grade 1 fatigue and grade 1 constipation.  ?She continues to have stable disease according to imaging review from February 2023. ?Okay to proceed with the Enhertu as planned today, she is currently at 4.4 mg/kg ?Last echo in March satisfactory, repeat echo in June ?We will also repeat imaging in mid May ?

## 2021-09-18 NOTE — Progress Notes (Signed)
?[place]  ?[phone] [phone]  ? ? ?ID: April Rosales DOB: [birth year]  MR#: [id number]  NFA#:[id number] ? ?Patient Care Team: ?[name], MD as PCP - General (Internal Medicine) ?[name], MD as Consulting Physician (Orthopedic Surgery) ?[name], MD as Consulting Physician (General Surgery) ?[name], MD as Consulting Physician (Cardiology) ?[name], RPH-CPP (Pharmacist) ? ?OTHER: [name], DDS ? ? ?CHIEF COMPLAINT: Stage IV estrogen receptor positive breast cancer ? ?CURRENT TREATMENT: Enhertu; denosumab/Prolia every 6 months; ? ? ?INTERVAL HISTORY: ? ?[name] is doing well.  She continues on Enhertu, tolerating this well.  She has a few days of fatigue after the infusion.  Besides fatigue, she denies any major diarrhea, cough, shortness of breath. ?She does have an occasional low back pain for which she is requesting some tramadol prescription.  She also needs a Valium as needed before any MRI.  She is once again taking her Eliquis every 5 days.  She is reluctant to take it every day. ?Rest of the pertinent 10 point ROS reviewed and negative ? ?BREAST CANCER HISTORY: ? ?Oncology History  ?Breast cancer metastasized to multiple sites [facility])  ?08/28/2014 Initial Diagnosis  ? Breast cancer metastasized to multiple sites [facility]) ? ?  ?Metastasis to bone [facility])  ?10/20/2014 Initial Diagnosis  ? Metastasis to bone [facility]) ? ?  ?Malignant neoplasm of upper-outer quadrant of left breast in female, estrogen receptor positive ([place])  ?09/02/1983 Surgery  ? status post right lumpectomy and axillary node dissection in 1985 followed by radiation at [facility], exact date not clear. ?  ?08/16/2014 Imaging  ? METASTATIC DISEASE 08/16/2014: measurable disease in spine, lung and left breast ?  (2) evaluation for left shoulder pain led to thoracic spine MRI 08/16/2014 showing a pathologic fracture at T3 with epidural tumor displacing the cord to the right, but no  cord compression. CT scans of the chest, abdomen and pelvis 08/24/2014 showed in addition a mass in the upper outer quadrant left breast measuring 1.6 cm and a nodule in the minor fissure of the right lung measuring 1.2 cm, but no parenchymal lung or liver lesions.  ? (a) CA 27-29 was noninformative at 38 (09/20/2014) ?  ?08/29/2014 Mammogram  ? mammography and ultrasonography 08/29/2014 show a mass in the upper inner left breast which was palpable,  measuring 2.0 cm by ultrasound. Biopsy of this mass 08/29/2014 showed an invasive breast cancer with both lobular and ductal features, estrogen receptor positive, progesterone receptor weakly positive, with an MIB-1 in the 40% range, HER-2 equivocal (6 else ratio 1.5, but average number her nucleus 5.8 ? ?  ?08/31/2014 - 05/29/2017 Anti-estrogen oral therapy  ? letrozole started 08/31/2014;  ? (a) palbociclib added Sept 2016 at 75 mg 21/7, with significant neutropenia; not repeated after first cycle ? (b) letrozole discontinued 05/29/2017 with evidence of disease progression in the breast ?  ?10/20/2014 Surgery  ? 10/20/2014 the patient underwent T2-T3 and T4 decompressive laminectomy with removal of epidural tumor, C7-T4 segmental pedicle screw instrumentation with virage screw system with arrow guidance protocol and C7-T4 posterolateral fusion. The cells were positive for the estrogen receptor. HER-2/neu testing by [facility] showed again equivocal results, ?  ?11/23/2014 - 12/11/2014 Radiation Therapy  ? 11/23/2014-12/11/2014. ? (a) T1-T5 was treated to 30 Gy in 12 fractions at 2.5 Gy per fraction  ?  ?09/11/2015 Initial Diagnosis  ? Malignant neoplasm of upper-outer quadrant of left breast in female, estrogen receptor positive ([place]) ? ?  ?  05/29/2017 - 06/21/2018 Anti-estrogen oral therapy  ? fulvestrant started 05/29/2017, last dose 07/22/2018 (discontinued due to pandemic). ? (a) started palbociclib 06/29/2017 at 75 mg every other day for 21 days on, 7 days off ? (b) palbociclib  discontinued on 3/29 due to progressive fatigue and patient preference ?  ?05/04/2018 Surgery  ? status post left lumpectomy 05/03/2018 showing a pT1b pNX invasive ductal carcinoma, grade 2, with equivocal HER-2 results ? (a) opted against adjuvant radiation given presence of stage IV disease ?  ?11/02/2018 PET scan  ? PET scan 11/02/2018 showed no active disease ? (a) cerianna scan on 10/31/2019 shows activity in 2 liver lesions, no active bone or lung lesions ? (b) abdominal ultrasound 11/08/2019 confirms 2 liver lesions measuring 4.4 and 2.1 cm. ? (c) biopsy of 1 of the liver lesions 12/12/2019 confirmed metastatic breast cancer, estrogen and progesterone receptor positive, with an MIB-1 of 10%, and HER-2 positive, with a copy number of 2.12 and the number per cell 6.25 ? (d) abdominal ultrasound 03/12/2020 shows one of the liver lesions to have increased, while the second lesion has decreased ?  ?01/25/2019 - 12/02/2019 Anti-estrogen oral therapy  ?  letrozole started 02/15/2019, discontinued 12/02/2019 with evidence of progression ? (a) bone density 09/12/2016 showed a T score of -4.8. ? ?  ?12/08/2019 - 01/24/2021 Anti-estrogen oral therapy  ? fulvestrant resumed 12/08/2019 ? (a) palbociclib added at 75 mg daily 21/7 starting 12/20/2019  ? (b) fulvestrant and palbociclib discontinued September 2022 with evidence of progression. ?  ?04/25/2020 -  Antibody Plan  ? trastuzumab started 04/25/2020, repeat every 28 days ? (a) echo 03/12/2020 shows an ejection fraction in the 60-65% range ? (b) changed to subQ formulation of trastuzumab 06/20/2020 ? (c) trastuzumab discontinued September 2022 with MRI of the abdomen showing evidence of progression ?  ? Genetic Testing  ? genetics testing using the Breast/Ovarian Cancer Panel through [facility] [name], MD) found no deleterious mutations in ATM, BARD1, BRCA1, BRCA2, BRIP1, CDH1, CHEK2, EPCAM, FANCC, MLH1, MSH2, MSH6, NBN, PALB2, PMS2, PTEN, RAD51C, RAD51D, STK11, TP53, or  XRCC2   ? ?  ?02/07/2021 -  Chemotherapy  ? Patient is on Treatment Plan :  ?BREAST METASTATIC fam-trastuzumab deruxtecan-nxki (Enhertu) q21d   ? ?starting Enhertu on 02/07/2021 ? (A) echo 11/21/2020 showed an ejection fraction in the 60-65% range ? (B) Enhertu changed to every 4 weeks after the second dose to allow for additional recovery time ? (C) treatment held after the third dose with significant drop in the patient's functional status ? (D) restaging abdominal MRI 05/14/2021 ? ?  ?04/25/2021 Imaging  ?  acute DVT involving the right femoral vein and other proximal veins.  The left side was benign ? (A) rivaroxaban started 04/25/2021, she stopped it 07/03/2021 ?  ?07/17/2021 Imaging  ? MRI abdomen done 07/17/2021 showed multifocal hepatic metastatic disease with similar distribution when compared to prior study using diffusion-weighted images lesions are quite similar but with perhaps slight interval decrease size and some of the smaller lesions, no gross areas of new disease in the liver ?  ? ? ? ? ?PAST MEDICAL HISTORY: ?Past Medical History:  ?Diagnosis Date  ? Arthritis   ? Breast cancer ([place]) 1985/2016  ? takes [name] daily  ? Chronic back pain   ? stenosis/listhesis  ? Family history of ovarian cancer   ? Osteoporosis   ? takes Vit D  ? Radiation 11/23/14-12/11/14  ? 30 Gy T1-T5   ? ? ?PAST SURGICAL HISTORY: ?Past  Surgical History:  ?Procedure Laterality Date  ? ABDOMINAL HYSTERECTOMY    ? 1980  ? APPENDECTOMY  1977  ? APPLICATION OF INTRAOPERATIVE CT SCAN N/A 10/20/2014  ? Procedure: APPLICATION OF INTRAOPERATIVE CAT SCAN;  Surgeon: [name], MD;  Location: [facility];  Service: Neurosurgery;  Laterality: N/A;  ? BREAST LUMPECTOMY WITH RADIOACTIVE SEED LOCALIZATION Left 05/03/2018  ? Procedure: LEFT BREAST LUMPECTOMY WITH BRACKETED RADIOACTIVE SEED LOCALIZATION;  Surgeon: [name], MD;  Location: [place];  Service: General;  Laterality: Left;  ? BREAST SURGERY Right 1985  ? THYROID  CYST EXCISION  1967  ? ? ?FAMILY HISTORY ?Family History  ?Problem Relation Age of Onset  ? Heart disease Mother   ? Hypertension Mother   ? Heart disease Father   ? Diabetes Father   ? Breast cancer Paternal

## 2021-09-18 NOTE — Patient Instructions (Signed)
[facility]  Discharge Instructions: ?Thank you for choosing [facility] to provide your oncology and hematology care.  ? ?If you have a lab appointment with the Cancer Center, please go directly to the Cancer Center and check in at the registration area. ?  ?Wear comfortable clothing and clothing appropriate for easy access to any Portacath or PICC line.  ? ?We strive to give you quality time with your provider. You may need to reschedule your appointment if you arrive late (15 or more minutes).  Arriving late affects you and other patients whose appointments are after yours.  Also, if you miss three or more appointments without notifying the office, you may be dismissed from the clinic at the provider?s discretion.    ?  ?For prescription refill requests, have your pharmacy contact our office and allow 72 hours for refills to be completed.   ? ?Today you received the following chemotherapy and/or immunotherapy agents: Enhertu.    ?  ?To help prevent nausea and vomiting after your treatment, we encourage you to take your nausea medication as directed. ? ?BELOW ARE SYMPTOMS THAT SHOULD BE REPORTED IMMEDIATELY: ?*FEVER GREATER THAN 100.4 F (38 ?C) OR HIGHER ?*CHILLS OR SWEATING ?*NAUSEA AND VOMITING THAT IS NOT CONTROLLED WITH YOUR NAUSEA MEDICATION ?*UNUSUAL SHORTNESS OF BREATH ?*UNUSUAL BRUISING OR BLEEDING ?*URINARY PROBLEMS (pain or burning when urinating, or frequent urination) ?*BOWEL PROBLEMS (unusual diarrhea, constipation, pain near the anus) ?TENDERNESS IN MOUTH AND THROAT WITH OR WITHOUT PRESENCE OF ULCERS (sore throat, sores in mouth, or a toothache) ?UNUSUAL RASH, SWELLING OR PAIN  ?UNUSUAL VAGINAL DISCHARGE OR ITCHING  ? ?Items with * indicate a potential emergency and should be followed up as soon as possible or go to the Emergency Department if any problems should occur. ? ?Please show the CHEMOTHERAPY ALERT CARD or IMMUNOTHERAPY ALERT CARD at check-in to  the Emergency Department and triage nurse. ? ?Should you have questions after your visit or need to cancel or reschedule your appointment, please contact [facility]  Dept: [phone]  and follow the prompts.  Office hours are 8:00 a.m. to 4:30 p.m. Monday - Friday. Please note that voicemails left after 4:00 p.m. may not be returned until the following business day.  We are closed weekends and major holidays. You have access to a nurse at all times for urgent questions. Please call the main number to the clinic Dept: [phone] and follow the prompts. ? ? ?For any non-urgent questions, you may also contact your provider using MyChart. We now offer e-Visits for anyone 18 and older to request care online for non-urgent symptoms. For details visit [email or web address]. ?  ?Also download the MyChart app! Go to the app store, search "MyChart", open the app, select [facility], and log in with your MyChart username and password. ? ?Due to Covid, a mask is required upon entering the hospital/clinic. If you do not have a mask, one will be given to you upon arrival. For doctor visits, patients may have 1 support person aged 18 or older with them. For treatment visits, patients cannot have anyone with them due to current Covid guidelines and our immunocompromised population.  ? ?

## 2021-09-18 NOTE — Assessment & Plan Note (Addendum)
Started Xarelto 04/25/2021, stopped 07/03/2021 ?She was then started on Eliquis.  She is however not taking Eliquis as prescribed.  We have had multiple discussions about this.  She tells me that she is taking Eliquis once every 5 days.  She understands that the pharmacokinetics of Eliquis does not allow dosing in such manner.  She understands that without anticoagulation and with active malignancy, she is at very high risk of DVT/PE, fatal cardiovascular events.  ?She has acknowledged the risks several times.  She however continues to take it every 5 days.  We will continue to monitor. ?

## 2021-09-18 NOTE — Assessment & Plan Note (Addendum)
No new pain. S/p palliative radiation to T1-T5 ?She however requested tramadol for management of low back pain.  Prescription has been refilled ?

## 2021-10-07 MED FILL — Dexamethasone Sodium Phosphate Inj 100 MG/10ML: INTRAMUSCULAR | Qty: 1 | Status: AC

## 2021-10-08 ENCOUNTER — Other Ambulatory Visit: Payer: Self-pay

## 2021-10-08 ENCOUNTER — Inpatient Hospital Stay (HOSPITAL_BASED_OUTPATIENT_CLINIC_OR_DEPARTMENT_OTHER): Payer: Medicare Other | Admitting: Hematology and Oncology

## 2021-10-08 ENCOUNTER — Inpatient Hospital Stay: Payer: Medicare Other

## 2021-10-08 ENCOUNTER — Inpatient Hospital Stay: Payer: Medicare Other | Attending: Oncology

## 2021-10-08 ENCOUNTER — Encounter: Payer: Self-pay | Admitting: Hematology and Oncology

## 2021-10-08 VITALS — HR 94

## 2021-10-08 DIAGNOSIS — C50412 Malignant neoplasm of upper-outer quadrant of left female breast: Secondary | ICD-10-CM | POA: Diagnosis not present

## 2021-10-08 DIAGNOSIS — C7951 Secondary malignant neoplasm of bone: Secondary | ICD-10-CM | POA: Insufficient documentation

## 2021-10-08 DIAGNOSIS — C787 Secondary malignant neoplasm of liver and intrahepatic bile duct: Secondary | ICD-10-CM | POA: Diagnosis not present

## 2021-10-08 DIAGNOSIS — Z170 Estrogen receptor positive status [ER+]: Secondary | ICD-10-CM | POA: Diagnosis not present

## 2021-10-08 DIAGNOSIS — Z5112 Encounter for antineoplastic immunotherapy: Secondary | ICD-10-CM | POA: Insufficient documentation

## 2021-10-08 DIAGNOSIS — I82411 Acute embolism and thrombosis of right femoral vein: Secondary | ICD-10-CM | POA: Diagnosis not present

## 2021-10-08 DIAGNOSIS — C50919 Malignant neoplasm of unspecified site of unspecified female breast: Secondary | ICD-10-CM

## 2021-10-08 LAB — CBC WITH DIFFERENTIAL (CANCER CENTER ONLY)
Abs Immature Granulocytes: 0.01 10*3/uL (ref 0.00–0.07)
Basophils Absolute: 0.1 10*3/uL (ref 0.0–0.1)
Basophils Relative: 1 %
Eosinophils Absolute: 0.2 10*3/uL (ref 0.0–0.5)
Eosinophils Relative: 5 %
HCT: 40.7 % (ref 36.0–46.0)
Hemoglobin: 14.2 g/dL (ref 12.0–15.0)
Immature Granulocytes: 0 %
Lymphocytes Relative: 25 %
Lymphs Abs: 1.1 10*3/uL (ref 0.7–4.0)
MCH: 33.5 pg (ref 26.0–34.0)
MCHC: 34.9 g/dL (ref 30.0–36.0)
MCV: 96 fL (ref 80.0–100.0)
Monocytes Absolute: 0.5 10*3/uL (ref 0.1–1.0)
Monocytes Relative: 12 %
Neutro Abs: 2.4 10*3/uL (ref 1.7–7.7)
Neutrophils Relative %: 57 %
Platelet Count: 280 10*3/uL (ref 150–400)
RBC: 4.24 MIL/uL (ref 3.87–5.11)
RDW: 15.3 % (ref 11.5–15.5)
WBC Count: 4.3 10*3/uL (ref 4.0–10.5)
nRBC: 0 % (ref 0.0–0.2)

## 2021-10-08 LAB — CMP (CANCER CENTER ONLY)
ALT: 13 U/L (ref 0–44)
AST: 22 U/L (ref 15–41)
Albumin: 3.6 g/dL (ref 3.5–5.0)
Alkaline Phosphatase: 39 U/L (ref 38–126)
Anion gap: 5 (ref 5–15)
BUN: 8 mg/dL (ref 8–23)
CO2: 30 mmol/L (ref 22–32)
Calcium: 10 mg/dL (ref 8.9–10.3)
Chloride: 107 mmol/L (ref 98–111)
Creatinine: 0.58 mg/dL (ref 0.44–1.00)
GFR, Estimated: >60 mL/min (ref >60)
Glucose, Bld: 106 mg/dL — ABNORMAL HIGH (ref 70–99)
Potassium: 3.7 mmol/L (ref 3.5–5.1)
Sodium: 142 mmol/L (ref 135–145)
Total Bilirubin: 0.4 mg/dL (ref 0.3–1.2)
Total Protein: 6.3 g/dL — ABNORMAL LOW (ref 6.5–8.1)

## 2021-10-08 MED ORDER — DIPHENHYDRAMINE HCL 25 MG PO CAPS
25.0000 mg | ORAL_CAPSULE | Freq: Once | ORAL | Status: AC
  Administered 2021-10-08: 25 mg via ORAL
  Filled 2021-10-08: qty 1

## 2021-10-08 MED ORDER — PALONOSETRON HCL INJECTION 0.25 MG/5ML
0.2500 mg | Freq: Once | INTRAVENOUS | Status: AC
  Administered 2021-10-08: 0.25 mg via INTRAVENOUS
  Filled 2021-10-08: qty 5

## 2021-10-08 MED ORDER — SODIUM CHLORIDE 0.9 % IV SOLN
10.0000 mg | Freq: Once | INTRAVENOUS | Status: AC
  Administered 2021-10-08: 10 mg via INTRAVENOUS
  Filled 2021-10-08: qty 10

## 2021-10-08 MED ORDER — DEXTROSE 5 % IV SOLN: Freq: Once | INTRAVENOUS | Status: AC

## 2021-10-08 MED ORDER — FAM-TRASTUZUMAB DERUXTECAN-NXKI CHEMO 100 MG IV SOLR
4.4000 mg/kg | Freq: Once | INTRAVENOUS | Status: AC
  Administered 2021-10-08: 232 mg via INTRAVENOUS
  Filled 2021-10-08: qty 11.6

## 2021-10-08 MED ORDER — ACETAMINOPHEN 325 MG PO TABS
650.0000 mg | ORAL_TABLET | Freq: Once | ORAL | Status: AC
  Administered 2021-10-08: 650 mg via ORAL
  Filled 2021-10-08: qty 2

## 2021-10-08 NOTE — Patient Instructions (Signed)
[facility]  Discharge Instructions: ?Thank you for choosing [facility] to provide your oncology and hematology care.  ? ?If you have a lab appointment with the Cancer Center, please go directly to the Cancer Center and check in at the registration area. ?  ?Wear comfortable clothing and clothing appropriate for easy access to any Portacath or PICC line.  ? ?We strive to give you quality time with your provider. You may need to reschedule your appointment if you arrive late (15 or more minutes).  Arriving late affects you and other patients whose appointments are after yours.  Also, if you miss three or more appointments without notifying the office, you may be dismissed from the clinic at the provider?s discretion.    ?  ?For prescription refill requests, have your pharmacy contact our office and allow 72 hours for refills to be completed.   ? ?Today you received the following chemotherapy and/or immunotherapy agents: Enhertu.    ?  ?To help prevent nausea and vomiting after your treatment, we encourage you to take your nausea medication as directed. ? ?BELOW ARE SYMPTOMS THAT SHOULD BE REPORTED IMMEDIATELY: ?*FEVER GREATER THAN 100.4 F (38 ?C) OR HIGHER ?*CHILLS OR SWEATING ?*NAUSEA AND VOMITING THAT IS NOT CONTROLLED WITH YOUR NAUSEA MEDICATION ?*UNUSUAL SHORTNESS OF BREATH ?*UNUSUAL BRUISING OR BLEEDING ?*URINARY PROBLEMS (pain or burning when urinating, or frequent urination) ?*BOWEL PROBLEMS (unusual diarrhea, constipation, pain near the anus) ?TENDERNESS IN MOUTH AND THROAT WITH OR WITHOUT PRESENCE OF ULCERS (sore throat, sores in mouth, or a toothache) ?UNUSUAL RASH, SWELLING OR PAIN  ?UNUSUAL VAGINAL DISCHARGE OR ITCHING  ? ?Items with * indicate a potential emergency and should be followed up as soon as possible or go to the Emergency Department if any problems should occur. ? ?Please show the CHEMOTHERAPY ALERT CARD or IMMUNOTHERAPY ALERT CARD at check-in to  the Emergency Department and triage nurse. ? ?Should you have questions after your visit or need to cancel or reschedule your appointment, please contact [facility]  Dept: [phone]  and follow the prompts.  Office hours are 8:00 a.m. to 4:30 p.m. Monday - Friday. Please note that voicemails left after 4:00 p.m. may not be returned until the following business day.  We are closed weekends and major holidays. You have access to a nurse at all times for urgent questions. Please call the main number to the clinic Dept: [phone] and follow the prompts. ? ? ?For any non-urgent questions, you may also contact your provider using MyChart. We now offer e-Visits for anyone 18 and older to request care online for non-urgent symptoms. For details visit [email or web address]. ?  ?Also download the MyChart app! Go to the app store, search "MyChart", open the app, select , and log in with your MyChart username and password. ? ?Due to Covid, a mask is required upon entering the hospital/clinic. If you do not have a mask, one will be given to you upon arrival. For doctor visits, patients may have 1 support person aged 18 or older with them. For treatment visits, patients cannot have anyone with them due to current Covid guidelines and our immunocompromised population.  ? ?

## 2021-10-08 NOTE — Assessment & Plan Note (Signed)
Started Xarelto 04/25/2021, stopped 07/03/2021 ?She was then started on Eliquis.  She is however not taking Eliquis as prescribed.  We have had multiple discussions about this.  She tells me that she is taking Eliquis once every 5 days.  She understands that the pharmacokinetics of Eliquis does not allow dosing in such manner.  She understands that without anticoagulation and with active malignancy, she is at very high risk of DVT/PE, fatal cardiovascular events.  ?No new symptoms concerning for recurrence of DVT/PE ?

## 2021-10-08 NOTE — Progress Notes (Signed)
?[place]  ?[phone] [phone]  ? ? ?ID: [name] DOB: [birth year]  MR#: [id number]  MWN#:[id number] ? ?Patient Care Team: ?[name], MD as PCP - General (Internal Medicine) ?[name], MD as Consulting Physician (Orthopedic Surgery) ?[name], MD as Consulting Physician (General Surgery) ?[name], MD as Consulting Physician (Cardiology) ?[name], RPH-CPP (Pharmacist) ? ?OTHER: [name], DDS ? ? ?CHIEF COMPLAINT: Stage IV estrogen receptor positive breast cancer ? ?CURRENT TREATMENT: Enhertu; denosumab/Prolia every 6 months; ? ? ?INTERVAL HISTORY: ? ?[name] is doing well.  She continues on Enhertu, tolerating this well.   ?She feels fatigued for a week after the infusion. ?Diarrhea not really bothersome at all, stools are soft. ?No nausea, vomiting. ?No chest pain, SOB ?No pain. ?Rest of the pertinent 10 point ROS reviewed and negative ? ?BREAST CANCER HISTORY: ? ?Oncology History  ?Breast cancer metastasized to multiple sites [facility])  ?08/28/2014 Initial Diagnosis  ? Breast cancer metastasized to multiple sites [facility]) ? ?  ?Metastasis to bone [facility])  ?10/20/2014 Initial Diagnosis  ? Metastasis to bone [facility]) ? ?  ?Malignant neoplasm of upper-outer quadrant of left breast in female, estrogen receptor positive ([place])  ?09/02/1983 Surgery  ? status post right lumpectomy and axillary node dissection in 1985 followed by radiation at [facility], exact date not clear. ?  ?08/16/2014 Imaging  ? METASTATIC DISEASE 08/16/2014: measurable disease in spine, lung and left breast ?  (2) evaluation for left shoulder pain led to thoracic spine MRI 08/16/2014 showing a pathologic fracture at T3 with epidural tumor displacing the cord to the right, but no cord compression. CT scans of the chest, abdomen and pelvis 08/24/2014 showed in addition a mass in the upper outer quadrant left breast measuring 1.6 cm and a nodule in the minor fissure of the right lung  measuring 1.2 cm, but no parenchymal lung or liver lesions.  ? (a) CA 27-29 was noninformative at 38 (09/20/2014) ?  ?08/29/2014 Mammogram  ? mammography and ultrasonography 08/29/2014 show a mass in the upper inner left breast which was palpable,  measuring 2.0 cm by ultrasound. Biopsy of this mass 08/29/2014 showed an invasive breast cancer with both lobular and ductal features, estrogen receptor positive, progesterone receptor weakly positive, with an MIB-1 in the 40% range, HER-2 equivocal (6 else ratio 1.5, but average number her nucleus 5.8 ? ?  ?08/31/2014 - 05/29/2017 Anti-estrogen oral therapy  ? letrozole started 08/31/2014;  ? (a) palbociclib added Sept 2016 at 75 mg 21/7, with significant neutropenia; not repeated after first cycle ? (b) letrozole discontinued 05/29/2017 with evidence of disease progression in the breast ?  ?10/20/2014 Surgery  ? 10/20/2014 the patient underwent T2-T3 and T4 decompressive laminectomy with removal of epidural tumor, C7-T4 segmental pedicle screw instrumentation with virage screw system with arrow guidance protocol and C7-T4 posterolateral fusion. The cells were positive for the estrogen receptor. HER-2/neu testing by [facility] showed again equivocal results, ?  ?11/23/2014 - 12/11/2014 Radiation Therapy  ? 11/23/2014-12/11/2014. ? (a) T1-T5 was treated to 30 Gy in 12 fractions at 2.5 Gy per fraction  ?  ?09/11/2015 Initial Diagnosis  ? Malignant neoplasm of upper-outer quadrant of left breast in female, estrogen receptor positive ([place]) ? ?  ?05/29/2017 - 06/21/2018 Anti-estrogen oral therapy  ? fulvestrant started 05/29/2017, last dose 07/22/2018 (discontinued due to pandemic). ? (a) started palbociclib 06/29/2017 at 75 mg every other day for 21 days on, 7 days off ? (b) palbociclib discontinued  on 3/29 due to progressive fatigue and patient preference ?  ?05/04/2018 Surgery  ? status post left lumpectomy 05/03/2018 showing a pT1b pNX invasive ductal carcinoma, grade 2, with equivocal HER-2  results ? (a) opted against adjuvant radiation given presence of stage IV disease ?  ?11/02/2018 PET scan  ? PET scan 11/02/2018 showed no active disease ? (a) cerianna scan on 10/31/2019 shows activity in 2 liver lesions, no active bone or lung lesions ? (b) abdominal ultrasound 11/08/2019 confirms 2 liver lesions measuring 4.4 and 2.1 cm. ? (c) biopsy of 1 of the liver lesions 12/12/2019 confirmed metastatic breast cancer, estrogen and progesterone receptor positive, with an MIB-1 of 10%, and HER-2 positive, with a copy number of 2.12 and the number per cell 6.25 ? (d) abdominal ultrasound 03/12/2020 shows one of the liver lesions to have increased, while the second lesion has decreased ?  ?01/25/2019 - 12/02/2019 Anti-estrogen oral therapy  ?  letrozole started 02/15/2019, discontinued 12/02/2019 with evidence of progression ? (a) bone density 09/12/2016 showed a T score of -4.8. ? ?  ?12/08/2019 - 01/24/2021 Anti-estrogen oral therapy  ? fulvestrant resumed 12/08/2019 ? (a) palbociclib added at 75 mg daily 21/7 starting 12/20/2019  ? (b) fulvestrant and palbociclib discontinued September 2022 with evidence of progression. ?  ?04/25/2020 -  Antibody Plan  ? trastuzumab started 04/25/2020, repeat every 28 days ? (a) echo 03/12/2020 shows an ejection fraction in the 60-65% range ? (b) changed to subQ formulation of trastuzumab 06/20/2020 ? (c) trastuzumab discontinued September 2022 with MRI of the abdomen showing evidence of progression ?  ? Genetic Testing  ? genetics testing using the Breast/Ovarian Cancer Panel through [facility] [name], MD) found no deleterious mutations in ATM, BARD1, BRCA1, BRCA2, BRIP1, CDH1, CHEK2, EPCAM, FANCC, MLH1, MSH2, MSH6, NBN, PALB2, PMS2, PTEN, RAD51C, RAD51D, STK11, TP53, or XRCC2   ? ?  ?02/07/2021 -  Chemotherapy  ? Patient is on Treatment Plan :  ?BREAST METASTATIC fam-trastuzumab deruxtecan-nxki (Enhertu) q21d   ? ?starting Enhertu on 02/07/2021 ? (A) echo 11/21/2020 showed an  ejection fraction in the 60-65% range ? (B) Enhertu changed to every 4 weeks after the second dose to allow for additional recovery time ? (C) treatment held after the third dose with significant drop in the patient's functional status ? (D) restaging abdominal MRI 05/14/2021 ? ?  ?04/25/2021 Imaging  ?  acute DVT involving the right femoral vein and other proximal veins.  The left side was benign ? (A) rivaroxaban started 04/25/2021, she stopped it 07/03/2021 ?  ?07/17/2021 Imaging  ? MRI abdomen done 07/17/2021 showed multifocal hepatic metastatic disease with similar distribution when compared to prior study using diffusion-weighted images lesions are quite similar but with perhaps slight interval decrease size and some of the smaller lesions, no gross areas of new disease in the liver ?  ? ? ? ? ?PAST MEDICAL HISTORY: ?Past Medical History:  ?Diagnosis Date  ? Arthritis   ? Breast cancer ([place]) 1985/2016  ? takes [name] daily  ? Chronic back pain   ? stenosis/listhesis  ? Family history of ovarian cancer   ? Osteoporosis   ? takes Vit D  ? Radiation 11/23/14-12/11/14  ? 30 Gy T1-T5   ? ? ?PAST SURGICAL HISTORY: ?Past Surgical History:  ?Procedure Laterality Date  ? ABDOMINAL HYSTERECTOMY    ? 1980  ? APPENDECTOMY  1977  ? APPLICATION OF INTRAOPERATIVE CT SCAN N/A 10/20/2014  ? Procedure: APPLICATION OF INTRAOPERATIVE CAT SCAN;  Surgeon: Louie Casa  Kritzer, MD;  Location: [facility];  Service: Neurosurgery;  Laterality: N/A;  ? BREAST LUMPECTOMY WITH RADIOACTIVE SEED LOCALIZATION Left 05/03/2018  ? Procedure: LEFT BREAST LUMPECTOMY WITH BRACKETED RADIOACTIVE SEED LOCALIZATION;  Surgeon: [name], MD;  Location: [place] [place];  Service: General;  Laterality: Left;  ? BREAST SURGERY Right 1985  ? THYROID CYST EXCISION  1967  ? ? ?FAMILY HISTORY ?Family History  ?Problem Relation Age of Onset  ? Heart disease Mother   ? Hypertension Mother   ? Heart disease Father   ? Diabetes Father   ? Breast cancer Paternal  Aunt   ?     4 paternal aunts with breast cancer over 40  ? Prostate cancer Paternal Uncle   ? Stroke Paternal Grandfather   ? Ovarian cancer Paternal Aunt   ? Huntington's disease Other   ?     Nephew, inher

## 2021-10-08 NOTE — Assessment & Plan Note (Addendum)
81 y.o. [place] woman with stage IV left breast cancer involving liver, bone currently on Enhertu who is here for follow up. Please review history above. ?She continues to tolerate Enhertu very well except for some grade 1 fatigue and grade 1 constipation.  ?She overall has been tolerating it very well. ?Repeat imaging planned in May ?Ok to proceed with treatment as planned tomorrow. ?Next ECHO scheduled first week of June,patient wants to reschedule given the early morning timing. ? ?

## 2021-10-18 ENCOUNTER — Ambulatory Visit (HOSPITAL_COMMUNITY)
Admission: RE | Admit: 2021-10-18 | Discharge: 2021-10-18 | Disposition: A | Discharge status: Home / Self Care | Payer: Medicare Other | Source: Ambulatory Visit | Attending: Hematology and Oncology | Admitting: Hematology and Oncology

## 2021-10-18 DIAGNOSIS — C787 Secondary malignant neoplasm of liver and intrahepatic bile duct: Secondary | ICD-10-CM | POA: Diagnosis not present

## 2021-10-18 DIAGNOSIS — K769 Liver disease, unspecified: Secondary | ICD-10-CM | POA: Diagnosis not present

## 2021-10-18 DIAGNOSIS — Z170 Estrogen receptor positive status [ER+]: Secondary | ICD-10-CM | POA: Diagnosis not present

## 2021-10-18 DIAGNOSIS — C50412 Malignant neoplasm of upper-outer quadrant of left female breast: Secondary | ICD-10-CM | POA: Diagnosis not present

## 2021-10-18 DIAGNOSIS — I82411 Acute embolism and thrombosis of right femoral vein: Secondary | ICD-10-CM | POA: Insufficient documentation

## 2021-10-18 DIAGNOSIS — C7951 Secondary malignant neoplasm of bone: Secondary | ICD-10-CM | POA: Diagnosis not present

## 2021-10-18 DIAGNOSIS — Z853 Personal history of malignant neoplasm of breast: Secondary | ICD-10-CM | POA: Diagnosis not present

## 2021-10-18 DIAGNOSIS — K8020 Calculus of gallbladder without cholecystitis without obstruction: Secondary | ICD-10-CM | POA: Diagnosis not present

## 2021-10-18 MED ORDER — GADOBUTROL 1 MMOL/ML IV SOLN
5.0000 mL | Freq: Once | INTRAVENOUS | Status: AC | PRN
  Administered 2021-10-18: 5 mL via INTRAVENOUS

## 2021-10-25 ENCOUNTER — Telehealth: Payer: Self-pay | Admitting: *Deleted

## 2021-10-25 NOTE — Telephone Encounter (Signed)
This RN spoke with pt per her call wanting to know results of MRI done last week " just worrying me because no one has called ".  This RN informed overall stable reading with only 1 area of new concern- and that this would be discussed at visit with MD on 10/30/2021.  April Rosales stated " I was hoping everything would be reduced "- this RN validated her statement but also reassured her that goal was to control growth with the least side effects with greatest function.  Pt appreciated discussion and reassurance.  No further needs at this time.

## 2021-10-29 MED FILL — Dexamethasone Sodium Phosphate Inj 100 MG/10ML: INTRAMUSCULAR | Qty: 1 | Status: AC

## 2021-10-30 ENCOUNTER — Other Ambulatory Visit: Payer: Self-pay

## 2021-10-30 ENCOUNTER — Inpatient Hospital Stay (HOSPITAL_BASED_OUTPATIENT_CLINIC_OR_DEPARTMENT_OTHER): Payer: Medicare Other | Admitting: Hematology and Oncology

## 2021-10-30 ENCOUNTER — Inpatient Hospital Stay: Payer: Medicare Other | Attending: Oncology

## 2021-10-30 ENCOUNTER — Inpatient Hospital Stay: Payer: Medicare Other

## 2021-10-30 ENCOUNTER — Encounter: Payer: Self-pay | Admitting: Hematology and Oncology

## 2021-10-30 VITALS — BP 154/81 | HR 94 | Resp 16

## 2021-10-30 DIAGNOSIS — C50412 Malignant neoplasm of upper-outer quadrant of left female breast: Secondary | ICD-10-CM | POA: Diagnosis not present

## 2021-10-30 DIAGNOSIS — Z170 Estrogen receptor positive status [ER+]: Secondary | ICD-10-CM | POA: Insufficient documentation

## 2021-10-30 DIAGNOSIS — C787 Secondary malignant neoplasm of liver and intrahepatic bile duct: Secondary | ICD-10-CM | POA: Insufficient documentation

## 2021-10-30 DIAGNOSIS — C7951 Secondary malignant neoplasm of bone: Secondary | ICD-10-CM | POA: Diagnosis not present

## 2021-10-30 DIAGNOSIS — Z5112 Encounter for antineoplastic immunotherapy: Secondary | ICD-10-CM | POA: Diagnosis not present

## 2021-10-30 DIAGNOSIS — C50919 Malignant neoplasm of unspecified site of unspecified female breast: Secondary | ICD-10-CM

## 2021-10-30 LAB — CMP (CANCER CENTER ONLY)
ALT: 10 U/L (ref 0–44)
AST: 20 U/L (ref 15–41)
Albumin: 3.6 g/dL (ref 3.5–5.0)
Alkaline Phosphatase: 39 U/L (ref 38–126)
Anion gap: 4 — ABNORMAL LOW (ref 5–15)
BUN: 9 mg/dL (ref 8–23)
CO2: 30 mmol/L (ref 22–32)
Calcium: 10.3 mg/dL (ref 8.9–10.3)
Chloride: 107 mmol/L (ref 98–111)
Creatinine: 0.52 mg/dL (ref 0.44–1.00)
GFR, Estimated: >60 mL/min (ref >60)
Glucose, Bld: 118 mg/dL — ABNORMAL HIGH (ref 70–99)
Potassium: 3.8 mmol/L (ref 3.5–5.1)
Sodium: 141 mmol/L (ref 135–145)
Total Bilirubin: 0.4 mg/dL (ref 0.3–1.2)
Total Protein: 6.3 g/dL — ABNORMAL LOW (ref 6.5–8.1)

## 2021-10-30 LAB — CBC WITH DIFFERENTIAL (CANCER CENTER ONLY)
Abs Immature Granulocytes: 0.01 10*3/uL (ref 0.00–0.07)
Basophils Absolute: 0.1 10*3/uL (ref 0.0–0.1)
Basophils Relative: 1 %
Eosinophils Absolute: 0.1 10*3/uL (ref 0.0–0.5)
Eosinophils Relative: 3 %
HCT: 42 % (ref 36.0–46.0)
Hemoglobin: 14.6 g/dL (ref 12.0–15.0)
Immature Granulocytes: 0 %
Lymphocytes Relative: 27 %
Lymphs Abs: 1.1 10*3/uL (ref 0.7–4.0)
MCH: 34 pg (ref 26.0–34.0)
MCHC: 34.8 g/dL (ref 30.0–36.0)
MCV: 97.9 fL (ref 80.0–100.0)
Monocytes Absolute: 0.5 10*3/uL (ref 0.1–1.0)
Monocytes Relative: 12 %
Neutro Abs: 2.3 10*3/uL (ref 1.7–7.7)
Neutrophils Relative %: 57 %
Platelet Count: 266 10*3/uL (ref 150–400)
RBC: 4.29 MIL/uL (ref 3.87–5.11)
RDW: 15.3 % (ref 11.5–15.5)
WBC Count: 4.1 10*3/uL (ref 4.0–10.5)
nRBC: 0 % (ref 0.0–0.2)

## 2021-10-30 MED ORDER — PALONOSETRON HCL INJECTION 0.25 MG/5ML
0.2500 mg | Freq: Once | INTRAVENOUS | Status: AC
  Administered 2021-10-30: 0.25 mg via INTRAVENOUS
  Filled 2021-10-30: qty 5

## 2021-10-30 MED ORDER — DIPHENHYDRAMINE HCL 25 MG PO CAPS
25.0000 mg | ORAL_CAPSULE | Freq: Once | ORAL | Status: AC
  Administered 2021-10-30: 25 mg via ORAL
  Filled 2021-10-30: qty 1

## 2021-10-30 MED ORDER — SODIUM CHLORIDE 0.9 % IV SOLN
10.0000 mg | Freq: Once | INTRAVENOUS | Status: AC
  Administered 2021-10-30: 10 mg via INTRAVENOUS
  Filled 2021-10-30: qty 10

## 2021-10-30 MED ORDER — ACETAMINOPHEN 325 MG PO TABS
650.0000 mg | ORAL_TABLET | Freq: Once | ORAL | Status: AC
  Administered 2021-10-30: 650 mg via ORAL
  Filled 2021-10-30: qty 2

## 2021-10-30 MED ORDER — DEXTROSE 5 % IV SOLN: Freq: Once | INTRAVENOUS | Status: AC

## 2021-10-30 MED ORDER — FAM-TRASTUZUMAB DERUXTECAN-NXKI CHEMO 100 MG IV SOLR
4.4000 mg/kg | Freq: Once | INTRAVENOUS | Status: AC
  Administered 2021-10-30: 232 mg via INTRAVENOUS
  Filled 2021-10-30: qty 11.6

## 2021-10-30 NOTE — Patient Instructions (Signed)
[facility]  Discharge Instructions: ?Thank you for choosing [facility] to provide your oncology and hematology care.  ? ?If you have a lab appointment with the Cancer Center, please go directly to the Cancer Center and check in at the registration area. ?  ?Wear comfortable clothing and clothing appropriate for easy access to any Portacath or PICC line.  ? ?We strive to give you quality time with your provider. You may need to reschedule your appointment if you arrive late (15 or more minutes).  Arriving late affects you and other patients whose appointments are after yours.  Also, if you miss three or more appointments without notifying the office, you may be dismissed from the clinic at the provider?s discretion.    ?  ?For prescription refill requests, have your pharmacy contact our office and allow 72 hours for refills to be completed.   ? ?Today you received the following chemotherapy and/or immunotherapy agents: Enhertu.    ?  ?To help prevent nausea and vomiting after your treatment, we encourage you to take your nausea medication as directed. ? ?BELOW ARE SYMPTOMS THAT SHOULD BE REPORTED IMMEDIATELY: ?*FEVER GREATER THAN 100.4 F (38 ?C) OR HIGHER ?*CHILLS OR SWEATING ?*NAUSEA AND VOMITING THAT IS NOT CONTROLLED WITH YOUR NAUSEA MEDICATION ?*UNUSUAL SHORTNESS OF BREATH ?*UNUSUAL BRUISING OR BLEEDING ?*URINARY PROBLEMS (pain or burning when urinating, or frequent urination) ?*BOWEL PROBLEMS (unusual diarrhea, constipation, pain near the anus) ?TENDERNESS IN MOUTH AND THROAT WITH OR WITHOUT PRESENCE OF ULCERS (sore throat, sores in mouth, or a toothache) ?UNUSUAL RASH, SWELLING OR PAIN  ?UNUSUAL VAGINAL DISCHARGE OR ITCHING  ? ?Items with * indicate a potential emergency and should be followed up as soon as possible or go to the Emergency Department if any problems should occur. ? ?Please show the CHEMOTHERAPY ALERT CARD or IMMUNOTHERAPY ALERT CARD at check-in to  the Emergency Department and triage nurse. ? ?Should you have questions after your visit or need to cancel or reschedule your appointment, please contact [facility]  Dept: [phone]  and follow the prompts.  Office hours are 8:00 a.m. to 4:30 p.m. Monday - Friday. Please note that voicemails left after 4:00 p.m. may not be returned until the following business day.  We are closed weekends and major holidays. You have access to a nurse at all times for urgent questions. Please call the main number to the clinic Dept: [phone] and follow the prompts. ? ? ?For any non-urgent questions, you may also contact your provider using MyChart. We now offer e-Visits for anyone 18 and older to request care online for non-urgent symptoms. For details visit [name]. ?  ?Also download the MyChart app! Go to the app store, search "MyChart", open the app, select [facility], and log in with your MyChart username and password. ? ?Due to Covid, a mask is required upon entering the hospital/clinic. If you do not have a mask, one will be given to you upon arrival. For doctor visits, patients may have 1 support person aged 18 or older with them. For treatment visits, patients cannot have anyone with them due to current Covid guidelines and our immunocompromised population.  ? ?

## 2021-10-30 NOTE — Progress Notes (Signed)
[facility]  [phone] [phone]    ID: HAIZEL GATCHELL DOB: 1940-10-31  MR#: [id number]  XNA#:[id number]  Patient Care Team: [name], MD as PCP - General (Internal Medicine) [name], MD as Consulting Physician (Orthopedic Surgery) [name], MD as Consulting Physician (General Surgery) [name], MD as Consulting Physician (Cardiology) [name], RPH-CPP (Pharmacist)  OTHER: [name], DDS   CHIEF COMPLAINT: Stage IV estrogen receptor positive breast cancer  CURRENT TREATMENT: Enhertu; denosumab/Prolia every 6 months;   INTERVAL HISTORY:  [name] is doing well.  She continues on Enhertu, tolerating this well.   She feels fatigued for a week after the infusion. Diarrhea not really bothersome at all, stools are soft. No nausea, vomiting. No chest pain, SOB She has been taking tramadol for some back pain once a week. Rest of the pertinent 10 point ROS reviewed and negative  BREAST CANCER HISTORY:  Oncology History  Breast cancer metastasized to multiple sites [facility])  08/28/2014 Initial Diagnosis   Breast cancer metastasized to multiple sites [facility])    Metastasis to bone ([place])  10/20/2014 Initial Diagnosis   Metastasis to bone ([place])    10/18/2021 Imaging   IMPRESSION: 1. Widespread metastatic disease to the liver appears generally stable compared to the prior study, with exception of a new lesion in segment 3 of the liver which measures 1.4 x 1.0 cm, as detailed above. 2. Cholelithiasis without evidence of acute cholecystitis at this time.     Malignant neoplasm of upper-outer quadrant of left breast in female, estrogen receptor positive ([place])  09/02/1983 Surgery   status post right lumpectomy and axillary node dissection in 1985 followed by radiation at [facility], exact date not clear.   08/16/2014 Imaging   METASTATIC DISEASE 08/16/2014: measurable disease in spine, lung and left breast   (2)  evaluation for left shoulder pain led to thoracic spine MRI 08/16/2014 showing a pathologic fracture at T3 with epidural tumor displacing the cord to the right, but no cord compression. CT scans of the chest, abdomen and pelvis 08/24/2014 showed in addition a mass in the upper outer quadrant left breast measuring 1.6 cm and a nodule in the minor fissure of the right lung measuring 1.2 cm, but no parenchymal lung or liver lesions.   (a) CA 27-29 was noninformative at 38 (09/20/2014)   08/29/2014 Mammogram   mammography and ultrasonography 08/29/2014 show a mass in the upper inner left breast which was palpable,  measuring 2.0 cm by ultrasound. Biopsy of this mass 08/29/2014 showed an invasive breast cancer with both lobular and ductal features, estrogen receptor positive, progesterone receptor weakly positive, with an MIB-1 in the 40% range, HER-2 equivocal (6 else ratio 1.5, but average number her nucleus 5.8    08/31/2014 - 05/29/2017 Anti-estrogen oral therapy   letrozole started 08/31/2014;   (a) palbociclib added Sept 2016 at 75 mg 21/7, with significant neutropenia; not repeated after first cycle  (b) letrozole discontinued 05/29/2017 with evidence of disease progression in the breast   10/20/2014 Surgery   10/20/2014 the patient underwent T2-T3 and T4 decompressive laminectomy with removal of epidural tumor, C7-T4 segmental pedicle screw instrumentation with virage screw system with arrow guidance protocol and C7-T4 posterolateral fusion. The cells were positive for the estrogen receptor. HER-2/neu testing by [facility] showed again equivocal results,   11/23/2014 - 12/11/2014 Radiation Therapy   11/23/2014-12/11/2014.  (a) T1-T5 was treated to 30 Gy in 12 fractions at 2.5 Gy per fraction  09/11/2015 Initial Diagnosis   Malignant neoplasm of upper-outer quadrant of left breast in female, estrogen receptor positive ([place])    05/29/2017 - 06/21/2018 Anti-estrogen oral therapy   fulvestrant started  05/29/2017, last dose 07/22/2018 (discontinued due to pandemic).  (a) started palbociclib 06/29/2017 at 75 mg every other day for 21 days on, 7 days off  (b) palbociclib discontinued on 3/29 due to progressive fatigue and patient preference   05/04/2018 Surgery   status post left lumpectomy 05/03/2018 showing a pT1b pNX invasive ductal carcinoma, grade 2, with equivocal HER-2 results  (a) opted against adjuvant radiation given presence of stage IV disease   11/02/2018 PET scan   PET scan 11/02/2018 showed no active disease  (a) cerianna scan on 10/31/2019 shows activity in 2 liver lesions, no active bone or lung lesions  (b) abdominal ultrasound 11/08/2019 confirms 2 liver lesions measuring 4.4 and 2.1 cm.  (c) biopsy of 1 of the liver lesions 12/12/2019 confirmed metastatic breast cancer, estrogen and progesterone receptor positive, with an MIB-1 of 10%, and HER-2 positive, with a copy number of 2.12 and the number per cell 6.25  (d) abdominal ultrasound 03/12/2020 shows one of the liver lesions to have increased, while the second lesion has decreased   01/25/2019 - 12/02/2019 Anti-estrogen oral therapy    letrozole started 02/15/2019, discontinued 12/02/2019 with evidence of progression  (a) bone density 09/12/2016 showed a T score of -4.8.    12/08/2019 - 01/24/2021 Anti-estrogen oral therapy   fulvestrant resumed 12/08/2019  (a) palbociclib added at 75 mg daily 21/7 starting 12/20/2019   (b) fulvestrant and palbociclib discontinued September 2022 with evidence of progression.   04/25/2020 -  Antibody Plan   trastuzumab started 04/25/2020, repeat every 28 days  (a) echo 03/12/2020 shows an ejection fraction in the 60-65% range  (b) changed to subQ formulation of trastuzumab 06/20/2020  (c) trastuzumab discontinued September 2022 with MRI of the abdomen showing evidence of progression    Genetic Testing   genetics testing using the Breast/Ovarian Cancer Panel through [facility] [name], MD)  found no deleterious mutations in ATM, BARD1, BRCA1, BRCA2, BRIP1, CDH1, CHEK2, EPCAM, FANCC, MLH1, MSH2, MSH6, NBN, PALB2, PMS2, PTEN, RAD51C, RAD51D, STK11, TP53, or XRCC2      02/07/2021 -  Chemotherapy   Patient is on Treatment Plan :  BREAST METASTATIC fam-trastuzumab deruxtecan-nxki (Enhertu) q21d    starting Enhertu on 02/07/2021  (A) echo 11/21/2020 showed an ejection fraction in the 60-65% range  (B) Enhertu changed to every 4 weeks after the second dose to allow for additional recovery time  (C) treatment held after the third dose with significant drop in the patient's functional status  (D) restaging abdominal MRI 05/14/2021    04/25/2021 Imaging    acute DVT involving the right femoral vein and other proximal veins.  The left side was benign  (A) rivaroxaban started 04/25/2021, she stopped it 07/03/2021   07/17/2021 Imaging   MRI abdomen done 07/17/2021 showed multifocal hepatic metastatic disease with similar distribution when compared to prior study using diffusion-weighted images lesions are quite similar but with perhaps slight interval decrease size and some of the smaller lesions, no gross areas of new disease in the liver   10/18/2021 Imaging   IMPRESSION: 1. Widespread metastatic disease to the liver appears generally stable compared to the prior study, with exception of a new lesion in segment 3 of the liver which measures 1.4 x 1.0 cm, as detailed above. 2. Cholelithiasis without evidence of acute cholecystitis at  this time.         PAST MEDICAL HISTORY: Past Medical History:  Diagnosis Date   Arthritis    Breast cancer ([place]) 1985/2016   takes [name] daily   Chronic back pain    stenosis/listhesis   Family history of ovarian cancer    Osteoporosis    takes Vit D   Radiation 11/23/14-12/11/14   30 Gy T1-T5     PAST SURGICAL HISTORY: Past Surgical History:  Procedure Laterality Date   ABDOMINAL HYSTERECTOMY     1980   APPENDECTOMY  0488   APPLICATION  OF INTRAOPERATIVE CT SCAN N/A 10/20/2014   Procedure: APPLICATION OF INTRAOPERATIVE CAT SCAN;  Surgeon: [name], MD;  Location: [facility];  Service: Neurosurgery;  Laterality: N/A;   BREAST LUMPECTOMY WITH RADIOACTIVE SEED LOCALIZATION Left 05/03/2018   Procedure: LEFT BREAST LUMPECTOMY WITH BRACKETED RADIOACTIVE SEED LOCALIZATION;  Surgeon: [name], MD;  Location: [facility];  Service: General;  Laterality: Left;   BREAST SURGERY Right 1985   THYROID CYST EXCISION  1967    FAMILY HISTORY Family History  Problem Relation Age of Onset   Heart disease Mother    Hypertension Mother    Heart disease Father    Diabetes Father    Breast cancer Paternal Aunt        4 paternal aunts with breast cancer over 30   Prostate cancer Paternal Uncle    Stroke Paternal Grandfather    Ovarian cancer Paternal Aunt    Huntington's disease Other        Nephew, inherited from his father  The patient's father died from a heart attack at the age of 26. He had 9 sisters. 3 of those sisters had breast cancer, all in a menopausal setting. Another sister had ovarian cancer. One of the paternal uncles had cancer of the colon "and back".  The patient's mother died at the age of 74. She was found to have breast cancer shortly before dying , during her final hospitalization.   GYNECOLOGIC HISTORY:   No LMP recorded. Patient has had a hysterectomy.  Menarche age 89, first live birth age 60, the patient is GX P1. She underwent hysterectomy in 1980. She thinks the ovaries were removed, but the CT scan obtained 08/24/2014 showed a definite right ovary. The left ovary may have been removed. She did not take hormone replacement after the hysterectomy.   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 She is divorced, lives alone, with 2 cats. Her son Bethann Berkshire lives in [place] where he works in Engineer, technical sales. He has 4 children of his own. The patient is a Psychologist, forensic.   ADVANCED  DIRECTIVES:  In place. The patient has named her sister Pleas Koch [phone]) and her friend Janina Mayo [phone]) as joint healthcare powers of attorney   HEALTH MAINTENANCE: Social History   Tobacco Use   Smoking status: Former    Packs/day: 0.50    Years: 10.00    Pack years: 5.00    Types: Cigarettes    Quit date: 05/03/1970    Years since quitting: 51.5   Smokeless tobacco: Former   Tobacco comments:    quit smoking 1970's  Substance Use Topics   Alcohol use: Yes    Alcohol/week: 0.0 standard drinks    Comment: Rare   Drug use: No     Colonoscopy: never  PAP: status post hysterectomy  Bone density: 09/12/2016 [facility]/ T score of -4.8 osteoporosis  Lipid panel:  Allergies  Allergen Reactions   Ativan [Lorazepam]     Made her crazy   Dilaudid [Hydromorphone Hcl]     Doesn't want   Haldol [Haloperidol Lactate]     Made her crazy   Xarelto [Rivaroxaban]     Pt reports debilitating dizziness.    Current Outpatient Medications  Medication Sig Dispense Refill   apixaban (ELIQUIS) 5 MG TABS tablet Take 1 tablet (5 mg total) by mouth 2 (two) times daily. 60 tablet 1   Cholecalciferol (VITAMIN D3 ADULT GUMMIES PO) Take 2,000 Units by mouth daily.      Cyanocobalamin (VITAMIN B-12 PO) Take by mouth daily. Takes every other day     dexamethasone (DECADRON) 4 MG tablet Take 2 tablets (8 mg total) by mouth daily. Start the day after chemotherapy for 2 days. 30 tablet 1   Iron-Vitamins (GERITOL PO) Take by mouth daily.     OVER THE COUNTER MEDICATION OTC Apple Cider Vinegar 1 capsule daily.     prochlorperazine (COMPAZINE) 10 MG tablet Take 1 tablet (10 mg total) by mouth every 6 (six) hours as needed (Nausea or vomiting). 30 tablet 1   traMADol (ULTRAM) 50 MG tablet Take 1 tablet (50 mg total) by mouth daily as needed. 20 tablet 0   No current facility-administered medications for this visit.    OBJECTIVE:   Vitals:   10/30/21 1251  BP: (!) 173/96  Pulse: (!)  104  Resp: 17  Temp: 97.9 F (36.6 C)  SpO2: 96%      Wt Readings from Last 3 Encounters:  10/30/21 109 lb (49.4 kg)  10/08/21 107 lb 14.4 oz (48.9 kg)  09/18/21 109 lb 6.4 oz (49.6 kg)   Body mass index is 20.6 kg/m.    Physical Exam Constitutional:      Appearance: Normal appearance.  Cardiovascular:     Rate and Rhythm: Normal rate and regular rhythm.     Pulses: Normal pulses.     Heart sounds: Normal heart sounds.  Pulmonary:     Effort: Pulmonary effort is normal.     Breath sounds: Normal breath sounds.  Abdominal:     General: Abdomen is flat. Bowel sounds are normal.  Musculoskeletal:        General: Swelling (Trace) present. Normal range of motion.     Cervical back: Normal range of motion and neck supple. No rigidity.  Lymphadenopathy:     Cervical: No cervical adenopathy.  Skin:    General: Skin is warm and dry.  Neurological:     General: No focal deficit present.     Mental Status: She is alert.    LAB RESULTS:  CMP     Component Value Date/Time   NA 141 10/30/2021 1243   NA 141 05/29/2017 1417   K 3.8 10/30/2021 1243   K 3.6 05/29/2017 1417   CL 107 10/30/2021 1243   CO2 30 10/30/2021 1243   CO2 27 05/29/2017 1417   GLUCOSE 118 (H) 10/30/2021 1243   GLUCOSE 111 05/29/2017 1417   BUN 9 10/30/2021 1243   BUN 17.0 05/29/2017 1417   CREATININE 0.52 10/30/2021 1243   CREATININE 0.66 03/05/2020 1218   CREATININE 0.8 05/29/2017 1417   CALCIUM 10.3 10/30/2021 1243   CALCIUM 10.7 (H) 06/26/2017 1216   CALCIUM 10.4 05/29/2017 1417   PROT 6.3 (L) 10/30/2021 1243   PROT 7.3 05/29/2017 1417   ALBUMIN 3.6 10/30/2021 1243   ALBUMIN 3.9 05/29/2017 1417   AST 20 10/30/2021 1243  AST 17 05/29/2017 1417   ALT 10 10/30/2021 1243   ALT 15 05/29/2017 1417   ALKPHOS 39 10/30/2021 1243   ALKPHOS 42 05/29/2017 1417   BILITOT 0.4 10/30/2021 1243   BILITOT 0.30 05/29/2017 1417   GFRNONAA >60 10/30/2021 1243   GFRNONAA 83 03/05/2020 1218   GFRAA 97  03/05/2020 1218    INo results found for: SPEP, UPEP  Lab Results  Component Value Date   WBC 4.1 10/30/2021   NEUTROABS 2.3 10/30/2021   HGB 14.6 10/30/2021   HCT 42.0 10/30/2021   MCV 97.9 10/30/2021   PLT 266 10/30/2021      Chemistry      Component Value Date/Time   NA 141 10/30/2021 1243   NA 141 05/29/2017 1417   K 3.8 10/30/2021 1243   K 3.6 05/29/2017 1417   CL 107 10/30/2021 1243   CO2 30 10/30/2021 1243   CO2 27 05/29/2017 1417   BUN 9 10/30/2021 1243   BUN 17.0 05/29/2017 1417   CREATININE 0.52 10/30/2021 1243   CREATININE 0.66 03/05/2020 1218   CREATININE 0.8 05/29/2017 1417      Component Value Date/Time   CALCIUM 10.3 10/30/2021 1243   CALCIUM 10.7 (H) 06/26/2017 1216   CALCIUM 10.4 05/29/2017 1417   ALKPHOS 39 10/30/2021 1243   ALKPHOS 42 05/29/2017 1417   AST 20 10/30/2021 1243   AST 17 05/29/2017 1417   ALT 10 10/30/2021 1243   ALT 15 05/29/2017 1417   BILITOT 0.4 10/30/2021 1243   BILITOT 0.30 05/29/2017 1417       Lab Results  Component Value Date   LABCA2 24 01/01/2016    No components found for: [id number]  No results for input(s): INR in the last 168 hours.  Urinalysis    Component Value Date/Time   COLORURINE YELLOW 03/05/2020 [place] 03/05/2020 1218   LABSPEC 1.006 03/05/2020 [place] 7.0 03/05/2020 [place] 03/05/2020 [place] 03/05/2020 [place] 09/19/2015 [place] 03/05/2020 [place] 03/05/2020 1218   NITRITE NEGATIVE 03/05/2020 1218   LEUKOCYTESUR 2+ (A) 03/05/2020 1218    STUDIES: MR Abdomen W Wo Contrast  Result Date: 10/21/2021 CLINICAL DATA:  81 year old female with history of breast cancer with known metastatic disease to the liver. Follow-up study. EXAM: MRI ABDOMEN WITHOUT AND WITH CONTRAST TECHNIQUE: Multiplanar multisequence MR imaging of the abdomen was performed both before and after the administration  of intravenous contrast. CONTRAST:  18m GADAVIST GADOBUTROL 1 MMOL/ML IV SOLN COMPARISON:  Multiple priors, most recently MRI of the abdomen 07/17/2021. FINDINGS: Lower chest: Susceptibility artifact in the right axillary region, likely from prior lymph node dissection. Hepatobiliary: Again noted are several aggressive appearing hepatic lesions which are T1 hypointense, mildly T2 hyperintense, demonstrate restricted diffusion and variable degrees of enhancement on post gadolinium imaging. The largest of these is in the central aspect of the liver involving portions of segments 4A and 8 (axial image 19 of series 35) measuring approximately 4.2 x 2.7 cm on today's study, similar to the prior examination. Several other smaller lesions are also generally unchanged compared to the prior examination, with exception of a new lesion in segment 3 of the liver, best appreciated on axial image 35 of series 12 and image 55 of series 6 measuring 1.4 x 1.0 cm. No intra or extrahepatic biliary ductal dilatation. Numerous filling defects within the gallbladder, compatible with gallstones.  Gallbladder is only mildly distended. Gallbladder wall thickness is normal. No pericholecystic fluid or surrounding inflammatory changes. Pancreas: No pancreatic mass. No pancreatic ductal dilatation. No pancreatic or peripancreatic fluid collections or inflammatory changes. Spleen:  Unremarkable. Adrenals/Urinary Tract: 1 cm T1 hypointense, T2 hyperintense, nonenhancing lesion in the upper pole of the left kidney, compatible with a simple cyst. Right kidney and bilateral adrenal glands are normal in appearance. No hydroureteronephrosis in the visualized portions of the abdomen. Stomach/Bowel: Visualized portions are unremarkable. Vascular/Lymphatic: No aneurysm identified in the visualized abdominal vasculature. No lymphadenopathy noted in the abdomen. Other: No significant volume of ascites noted in the visualized portions of the peritoneal  cavity. Musculoskeletal: No aggressive appearing osseous lesions are noted in the visualized portions of the skeleton. IMPRESSION: 1. Widespread metastatic disease to the liver appears generally stable compared to the prior study, with exception of a new lesion in segment 3 of the liver which measures 1.4 x 1.0 cm, as detailed above. 2. Cholelithiasis without evidence of acute cholecystitis at this time. Electronically Signed   By: Vinnie Langton M.D.   On: 10/21/2021 07:52     ASSESSMENT:   Malignant neoplasm of upper-outer quadrant of left breast in female, estrogen receptor positive ([place]) 82 y.o. [place] woman with stage IV left breast cancer involving liver, bone currently on Enhertu who is here for follow up. Please review history above. She continues to tolerate Enhertu very well except for some grade 1 fatigue and grade 1 constipation.  She had recent MRI abdomen to evaluate disease progression and this showed ongoing widespread metastatic disease to the liver appears generally stable with the exception of one new lesion which measured 1.4 x 1 cm.  I have reviewed the imaging with radiology and they are certain about new lesion in the liver.  This is amenable to biopsy. We will proceed with biopsy of this lesion to confirm prognostics.  If this is indeed still HER2 amplified, we can try Kadcyla.  She is 48 with decent performance status but may not be a good candidate for aggressive chemotherapy. We will continue kadcyla today since she has stable disease except for this lesion. Have discussed this with the patient today. I have reviewed the imaging with patient, discussed with our radiology team as well. CBC, CMP reviewed, ok to proceed with labs.   Thank you for consulting [place] in the care of this patient.  Please do not hesitate to contact us with any additional questions or concerns  Benay Pike MD

## 2021-10-30 NOTE — Assessment & Plan Note (Addendum)
81 y.o. [place] woman with stage IV left breast cancer involving liver, bone currently on Enhertu who is here for follow up. Please review history above. She continues to tolerate Enhertu very well except for some grade 1 fatigue and grade 1 constipation.  She had recent MRI abdomen to evaluate disease progression and this showed ongoing widespread metastatic disease to the liver appears generally stable with the exception of one new lesion which measured 1.4 x 1 cm.  I have reviewed the imaging with radiology and they are certain about new lesion in the liver.  This is amenable to biopsy. We will proceed with biopsy of this lesion to confirm prognostics.  If this is indeed still HER2 amplified, we can try Kadcyla.  She is 44 with decent performance status but may not be a good candidate for aggressive chemotherapy. We will continue kadcyla today since she has stable disease except for this lesion. Have discussed this with the patient today. I have reviewed the imaging with patient, discussed with our radiology team as well. CBC, CMP reviewed, ok to proceed with labs.

## 2021-10-30 NOTE — Progress Notes (Deleted)
[place]  [phone] [phone]    ID: April Rosales DOB: Jun 07, 1940  MR#: [id number]  QJJ#:[id number]  Patient Care Team: [name], MD as PCP - General (Internal Medicine) [name], MD as Consulting Physician (Orthopedic Surgery) [name], MD as Consulting Physician (General Surgery) [name], MD as Consulting Physician (Cardiology) [name], RPH-CPP (Pharmacist)  OTHER: [name], DDS   CHIEF COMPLAINT: Stage IV estrogen receptor positive breast cancer  CURRENT TREATMENT: Enhertu; denosumab/Prolia every 6 months;   INTERVAL HISTORY:  April Rosales is here for follow up on enhertu and to discuss recent imaging reports.  Rest of the pertinent 10 point ROS reviewed and negative  BREAST CANCER HISTORY:  Oncology History  Breast cancer metastasized to multiple sites [facility])  08/28/2014 Initial Diagnosis   Breast cancer metastasized to multiple sites [facility])    Metastasis to bone ([place])  10/20/2014 Initial Diagnosis   Metastasis to bone ([place])    10/18/2021 Imaging   IMPRESSION: 1. Widespread metastatic disease to the liver appears generally stable compared to the prior study, with exception of a new lesion in segment 3 of the liver which measures 1.4 x 1.0 cm, as detailed above. 2. Cholelithiasis without evidence of acute cholecystitis at this time.     Malignant neoplasm of upper-outer quadrant of left breast in female, estrogen receptor positive ([place])  09/02/1983 Surgery   status post right lumpectomy and axillary node dissection in 1985 followed by radiation at [facility], exact date not clear.   08/16/2014 Imaging   METASTATIC DISEASE 08/16/2014: measurable disease in spine, lung and left breast   (2) evaluation for left shoulder pain led to thoracic spine MRI 08/16/2014 showing a pathologic fracture at T3 with epidural tumor displacing the cord to the right, but no cord compression. CT scans of the  chest, abdomen and pelvis 08/24/2014 showed in addition a mass in the upper outer quadrant left breast measuring 1.6 cm and a nodule in the minor fissure of the right lung measuring 1.2 cm, but no parenchymal lung or liver lesions.   (a) CA 27-29 was noninformative at 38 (09/20/2014)   08/29/2014 Mammogram   mammography and ultrasonography 08/29/2014 show a mass in the upper inner left breast which was palpable,  measuring 2.0 cm by ultrasound. Biopsy of this mass 08/29/2014 showed an invasive breast cancer with both lobular and ductal features, estrogen receptor positive, progesterone receptor weakly positive, with an MIB-1 in the 40% range, HER-2 equivocal (6 else ratio 1.5, but average number her nucleus 5.8    08/31/2014 - 05/29/2017 Anti-estrogen oral therapy   letrozole started 08/31/2014;   (a) palbociclib added Sept 2016 at 75 mg 21/7, with significant neutropenia; not repeated after first cycle  (b) letrozole discontinued 05/29/2017 with evidence of disease progression in the breast   10/20/2014 Surgery   10/20/2014 the patient underwent T2-T3 and T4 decompressive laminectomy with removal of epidural tumor, C7-T4 segmental pedicle screw instrumentation with virage screw system with arrow guidance protocol and C7-T4 posterolateral fusion. The cells were positive for the estrogen receptor. HER-2/neu testing by [facility] showed again equivocal results,   11/23/2014 - 12/11/2014 Radiation Therapy   11/23/2014-12/11/2014.  (a) T1-T5 was treated to 30 Gy in 12 fractions at 2.5 Gy per fraction    09/11/2015 Initial Diagnosis   Malignant neoplasm of upper-outer quadrant of left breast in female, estrogen receptor positive ([place])    05/29/2017 - 06/21/2018 Anti-estrogen oral therapy   fulvestrant started 05/29/2017,  last dose 07/22/2018 (discontinued due to pandemic).  (a) started palbociclib 06/29/2017 at 75 mg every other day for 21 days on, 7 days off  (b) palbociclib discontinued on 3/29 due to progressive  fatigue and patient preference   05/04/2018 Surgery   status post left lumpectomy 05/03/2018 showing a pT1b pNX invasive ductal carcinoma, grade 2, with equivocal HER-2 results  (a) opted against adjuvant radiation given presence of stage IV disease   11/02/2018 PET scan   PET scan 11/02/2018 showed no active disease  (a) cerianna scan on 10/31/2019 shows activity in 2 liver lesions, no active bone or lung lesions  (b) abdominal ultrasound 11/08/2019 confirms 2 liver lesions measuring 4.4 and 2.1 cm.  (c) biopsy of 1 of the liver lesions 12/12/2019 confirmed metastatic breast cancer, estrogen and progesterone receptor positive, with an MIB-1 of 10%, and HER-2 positive, with a copy number of 2.12 and the number per cell 6.25  (d) abdominal ultrasound 03/12/2020 shows one of the liver lesions to have increased, while the second lesion has decreased   01/25/2019 - 12/02/2019 Anti-estrogen oral therapy    letrozole started 02/15/2019, discontinued 12/02/2019 with evidence of progression  (a) bone density 09/12/2016 showed a T score of -4.8.    12/08/2019 - 01/24/2021 Anti-estrogen oral therapy   fulvestrant resumed 12/08/2019  (a) palbociclib added at 75 mg daily 21/7 starting 12/20/2019   (b) fulvestrant and palbociclib discontinued September 2022 with evidence of progression.   04/25/2020 -  Antibody Plan   trastuzumab started 04/25/2020, repeat every 28 days  (a) echo 03/12/2020 shows an ejection fraction in the 60-65% range  (b) changed to subQ formulation of trastuzumab 06/20/2020  (c) trastuzumab discontinued September 2022 with MRI of the abdomen showing evidence of progression    Genetic Testing   genetics testing using the Breast/Ovarian Cancer Panel through [facility] April Pigeon, MD) found no deleterious mutations in ATM, BARD1, BRCA1, BRCA2, BRIP1, CDH1, CHEK2, EPCAM, FANCC, MLH1, MSH2, MSH6, NBN, PALB2, PMS2, PTEN, RAD51C, RAD51D, STK11, TP53, or XRCC2      02/07/2021 -  Chemotherapy    Patient is on Treatment Plan :  BREAST METASTATIC fam-trastuzumab deruxtecan-nxki (Enhertu) q21d    starting Enhertu on 02/07/2021  (A) echo 11/21/2020 showed an ejection fraction in the 60-65% range  (B) Enhertu changed to every 4 weeks after the second dose to allow for additional recovery time  (C) treatment held after the third dose with significant drop in the patient's functional status  (D) restaging abdominal MRI 05/14/2021    04/25/2021 Imaging    acute DVT involving the right femoral vein and other proximal veins.  The left side was benign  (A) rivaroxaban started 04/25/2021, she stopped it 07/03/2021   07/17/2021 Imaging   MRI abdomen done 07/17/2021 showed multifocal hepatic metastatic disease with similar distribution when compared to prior study using diffusion-weighted images lesions are quite similar but with perhaps slight interval decrease size and some of the smaller lesions, no gross areas of new disease in the liver   10/18/2021 Imaging   IMPRESSION: 1. Widespread metastatic disease to the liver appears generally stable compared to the prior study, with exception of a new lesion in segment 3 of the liver which measures 1.4 x 1.0 cm, as detailed above. 2. Cholelithiasis without evidence of acute cholecystitis at this time.      Interval History  She feels ok, feels energy is less after infusion. She has been taking tramadol for pain. Besides fatigue from [place], she denies any  problems. No diarrhea. No chest pain or shortness of breath. Rest of the pertinent 10 point ROS reviewed and negative.  PAST MEDICAL HISTORY: Past Medical History:  Diagnosis Date   Arthritis    Breast cancer ([place]) 1985/2016   takes [name] daily   Chronic back pain    stenosis/listhesis   Family history of ovarian cancer    Osteoporosis    takes Vit D   Radiation 11/23/14-12/11/14   30 Gy T1-T5     PAST SURGICAL HISTORY: Past Surgical History:  Procedure Laterality Date    ABDOMINAL HYSTERECTOMY     1980   APPENDECTOMY  9476   APPLICATION OF INTRAOPERATIVE CT SCAN N/A 10/20/2014   Procedure: APPLICATION OF INTRAOPERATIVE CAT SCAN;  Surgeon: [name], MD;  Location: [facility];  Service: Neurosurgery;  Laterality: N/A;   BREAST LUMPECTOMY WITH RADIOACTIVE SEED LOCALIZATION Left 05/03/2018   Procedure: LEFT BREAST LUMPECTOMY WITH BRACKETED RADIOACTIVE SEED LOCALIZATION;  Surgeon: [name], MD;  Location: [facility];  Service: General;  Laterality: Left;   BREAST SURGERY Right 1985   THYROID CYST EXCISION  1967    FAMILY HISTORY Family History  Problem Relation Age of Onset   Heart disease Mother    Hypertension Mother    Heart disease Father    Diabetes Father    Breast cancer Paternal Aunt        4 paternal aunts with breast cancer over 34   Prostate cancer Paternal Uncle    Stroke Paternal Grandfather    Ovarian cancer Paternal Aunt    Huntington's disease Other        Nephew, inherited from his father  The patient's father died from a heart attack at the age of 73. He had 9 sisters. 3 of those sisters had breast cancer, all in a menopausal setting. Another sister had ovarian cancer. One of the paternal uncles had cancer of the colon "and back".  The patient's mother died at the age of 62. She was found to have breast cancer shortly before dying , during her final hospitalization.   GYNECOLOGIC HISTORY:   No LMP recorded. Patient has had a hysterectomy.  Menarche age 11, first live birth age 55, the patient is GX P1. She underwent hysterectomy in 1980. She thinks the ovaries were removed, but the CT scan obtained 08/24/2014 showed a definite right ovary. The left ovary may have been removed. She did not take hormone replacement after the hysterectomy.   SOCIAL HISTORY:   Iviona worked in Psychiatric nurse. She is divorced, lives alone, with 2 cats. Her son Bethann Berkshire lives in [place] where he works in Engineer, technical sales.  He has 4 children of his own. The patient is a Psychologist, forensic.   ADVANCED DIRECTIVES:  In place. The patient has named her sister Pleas Koch [phone]) and her friend Janina Mayo [phone]) as joint healthcare powers of attorney   HEALTH MAINTENANCE: Social History   Tobacco Use   Smoking status: Former    Packs/day: 0.50    Years: 10.00    Pack years: 5.00    Types: Cigarettes    Quit date: 05/03/1970    Years since quitting: 51.5   Smokeless tobacco: Former   Tobacco comments:    quit smoking 1970's  Substance Use Topics   Alcohol use: Yes    Alcohol/week: 0.0 standard drinks    Comment: Rare   Drug use: No     Colonoscopy: never  PAP: status post hysterectomy  Bone density: 09/12/2016  [facility]/ T score of -4.8 osteoporosis  Lipid panel:  Allergies  Allergen Reactions   Ativan [Lorazepam]     Made her crazy   Dilaudid [Hydromorphone Hcl]     Doesn't want   Haldol [Haloperidol Lactate]     Made her crazy   Xarelto [Rivaroxaban]     Pt reports debilitating dizziness.    Current Outpatient Medications  Medication Sig Dispense Refill   apixaban (ELIQUIS) 5 MG TABS tablet Take 1 tablet (5 mg total) by mouth 2 (two) times daily. 60 tablet 1   Cholecalciferol (VITAMIN D3 ADULT GUMMIES PO) Take 2,000 Units by mouth daily.      Cyanocobalamin (VITAMIN B-12 PO) Take by mouth daily. Takes every other day     dexamethasone (DECADRON) 4 MG tablet Take 2 tablets (8 mg total) by mouth daily. Start the day after chemotherapy for 2 days. 30 tablet 1   Iron-Vitamins (GERITOL PO) Take by mouth daily.     OVER THE COUNTER MEDICATION OTC Apple Cider Vinegar 1 capsule daily.     prochlorperazine (COMPAZINE) 10 MG tablet Take 1 tablet (10 mg total) by mouth every 6 (six) hours as needed (Nausea or vomiting). 30 tablet 1   traMADol (ULTRAM) 50 MG tablet Take 1 tablet (50 mg total) by mouth daily as needed. 20 tablet 0   No current facility-administered medications for this visit.     OBJECTIVE:   There were no vitals filed for this visit.   Wt Readings from Last 3 Encounters:  10/08/21 107 lb 14.4 oz (48.9 kg)  09/18/21 109 lb 6.4 oz (49.6 kg)  08/28/21 109 lb 9.6 oz (49.7 kg)   There is no height or weight on file to calculate BMI.    Physical Exam Constitutional:      Appearance: Normal appearance.  Cardiovascular:     Rate and Rhythm: Normal rate and regular rhythm.     Pulses: Normal pulses.     Heart sounds: Normal heart sounds.  Pulmonary:     Effort: Pulmonary effort is normal.     Breath sounds: Normal breath sounds.  Abdominal:     General: Abdomen is flat. Bowel sounds are normal.  Musculoskeletal:        General: Swelling (Trace) present. Normal range of motion.     Cervical back: Normal range of motion and neck supple. No rigidity.  Lymphadenopathy:     Cervical: No cervical adenopathy.  Skin:    General: Skin is warm and dry.  Neurological:     General: No focal deficit present.     Mental Status: She is alert.    LAB RESULTS:  CMP     Component Value Date/Time   NA 142 10/08/2021 1222   NA 141 05/29/2017 1417   K 3.7 10/08/2021 1222   K 3.6 05/29/2017 1417   CL 107 10/08/2021 1222   CO2 30 10/08/2021 1222   CO2 27 05/29/2017 1417   GLUCOSE 106 (H) 10/08/2021 1222   GLUCOSE 111 05/29/2017 1417   BUN 8 10/08/2021 1222   BUN 17.0 05/29/2017 1417   CREATININE 0.58 10/08/2021 1222   CREATININE 0.66 03/05/2020 1218   CREATININE 0.8 05/29/2017 1417   CALCIUM 10.0 10/08/2021 1222   CALCIUM 10.7 (H) 06/26/2017 1216   CALCIUM 10.4 05/29/2017 1417   PROT 6.3 (L) 10/08/2021 1222   PROT 7.3 05/29/2017 1417   ALBUMIN 3.6 10/08/2021 1222   ALBUMIN 3.9 05/29/2017 1417   AST 22 10/08/2021 1222   AST  17 05/29/2017 1417   ALT 13 10/08/2021 1222   ALT 15 05/29/2017 1417   ALKPHOS 39 10/08/2021 1222   ALKPHOS 42 05/29/2017 1417   BILITOT 0.4 10/08/2021 1222   BILITOT 0.30 05/29/2017 1417   GFRNONAA >60 10/08/2021 1222    GFRNONAA 83 03/05/2020 1218   GFRAA 97 03/05/2020 1218    INo results found for: SPEP, UPEP  Lab Results  Component Value Date   WBC 4.3 10/08/2021   NEUTROABS 2.4 10/08/2021   HGB 14.2 10/08/2021   HCT 40.7 10/08/2021   MCV 96.0 10/08/2021   PLT 280 10/08/2021      Chemistry      Component Value Date/Time   NA 142 10/08/2021 1222   NA 141 05/29/2017 1417   K 3.7 10/08/2021 1222   K 3.6 05/29/2017 1417   CL 107 10/08/2021 1222   CO2 30 10/08/2021 1222   CO2 27 05/29/2017 1417   BUN 8 10/08/2021 1222   BUN 17.0 05/29/2017 1417   CREATININE 0.58 10/08/2021 1222   CREATININE 0.66 03/05/2020 1218   CREATININE 0.8 05/29/2017 1417      Component Value Date/Time   CALCIUM 10.0 10/08/2021 1222   CALCIUM 10.7 (H) 06/26/2017 1216   CALCIUM 10.4 05/29/2017 1417   ALKPHOS 39 10/08/2021 1222   ALKPHOS 42 05/29/2017 1417   AST 22 10/08/2021 1222   AST 17 05/29/2017 1417   ALT 13 10/08/2021 1222   ALT 15 05/29/2017 1417   BILITOT 0.4 10/08/2021 1222   BILITOT 0.30 05/29/2017 1417       Lab Results  Component Value Date   LABCA2 24 01/01/2016    No components found for: [id number]  No results for input(s): INR in the last 168 hours.  Urinalysis    Component Value Date/Time   COLORURINE YELLOW 03/05/2020 [place] 03/05/2020 1218   LABSPEC 1.006 03/05/2020 [place] 7.0 03/05/2020 [place] 03/05/2020 [place] 03/05/2020 [place] 09/19/2015 [place] 03/05/2020 [place] 03/05/2020 1218   NITRITE NEGATIVE 03/05/2020 1218   LEUKOCYTESUR 2+ (A) 03/05/2020 1218    STUDIES: MR Abdomen W Wo Contrast  Result Date: 10/21/2021 CLINICAL DATA:  81 year old female with history of breast cancer with known metastatic disease to the liver. Follow-up study. EXAM: MRI ABDOMEN WITHOUT AND WITH CONTRAST TECHNIQUE: Multiplanar multisequence MR imaging of the abdomen was performed  both before and after the administration of intravenous contrast. CONTRAST:  72m GADAVIST GADOBUTROL 1 MMOL/ML IV SOLN COMPARISON:  Multiple priors, most recently MRI of the abdomen 07/17/2021. FINDINGS: Lower chest: Susceptibility artifact in the right axillary region, likely from prior lymph node dissection. Hepatobiliary: Again noted are several aggressive appearing hepatic lesions which are T1 hypointense, mildly T2 hyperintense, demonstrate restricted diffusion and variable degrees of enhancement on post gadolinium imaging. The largest of these is in the central aspect of the liver involving portions of segments 4A and 8 (axial image 19 of series 35) measuring approximately 4.2 x 2.7 cm on today's study, similar to the prior examination. Several other smaller lesions are also generally unchanged compared to the prior examination, with exception of a new lesion in segment 3 of the liver, best appreciated on axial image 35 of series 12 and image 55 of series 6 measuring 1.4 x 1.0 cm. No intra or extrahepatic biliary ductal dilatation. Numerous filling defects within the gallbladder, compatible with gallstones. Gallbladder  is only mildly distended. Gallbladder wall thickness is normal. No pericholecystic fluid or surrounding inflammatory changes. Pancreas: No pancreatic mass. No pancreatic ductal dilatation. No pancreatic or peripancreatic fluid collections or inflammatory changes. Spleen:  Unremarkable. Adrenals/Urinary Tract: 1 cm T1 hypointense, T2 hyperintense, nonenhancing lesion in the upper pole of the left kidney, compatible with a simple cyst. Right kidney and bilateral adrenal glands are normal in appearance. No hydroureteronephrosis in the visualized portions of the abdomen. Stomach/Bowel: Visualized portions are unremarkable. Vascular/Lymphatic: No aneurysm identified in the visualized abdominal vasculature. No lymphadenopathy noted in the abdomen. Other: No significant volume of ascites noted in the  visualized portions of the peritoneal cavity. Musculoskeletal: No aggressive appearing osseous lesions are noted in the visualized portions of the skeleton. IMPRESSION: 1. Widespread metastatic disease to the liver appears generally stable compared to the prior study, with exception of a new lesion in segment 3 of the liver which measures 1.4 x 1.0 cm, as detailed above. 2. Cholelithiasis without evidence of acute cholecystitis at this time. Electronically Signed   By: Vinnie Langton M.D.   On: 10/21/2021 07:52     ASSESSMENT:   No problem-specific Assessment & Plan notes found for this encounter.   Thank you for consulting [place] in the care of this patient.  Please do not hesitate to contact us with any additional questions or concerns  Benay Pike MD

## 2021-10-31 ENCOUNTER — Other Ambulatory Visit (HOSPITAL_COMMUNITY): Payer: Medicare Other

## 2021-11-05 ENCOUNTER — Ambulatory Visit (HOSPITAL_COMMUNITY)
Admission: RE | Admit: 2021-11-05 | Discharge: 2021-11-05 | Disposition: A | Discharge status: Home / Self Care | Payer: Medicare Other | Source: Ambulatory Visit | Attending: Hematology and Oncology | Admitting: Hematology and Oncology

## 2021-11-05 DIAGNOSIS — C50412 Malignant neoplasm of upper-outer quadrant of left female breast: Secondary | ICD-10-CM | POA: Diagnosis not present

## 2021-11-05 DIAGNOSIS — Z0189 Encounter for other specified special examinations: Secondary | ICD-10-CM

## 2021-11-05 DIAGNOSIS — Z79899 Other long term (current) drug therapy: Secondary | ICD-10-CM

## 2021-11-05 DIAGNOSIS — Z5181 Encounter for therapeutic drug level monitoring: Secondary | ICD-10-CM

## 2021-11-05 DIAGNOSIS — Z170 Estrogen receptor positive status [ER+]: Secondary | ICD-10-CM | POA: Insufficient documentation

## 2021-11-05 DIAGNOSIS — Z01818 Encounter for other preprocedural examination: Secondary | ICD-10-CM | POA: Diagnosis not present

## 2021-11-05 LAB — ECHOCARDIOGRAM COMPLETE
AR max vel: 1.35 cm2
AV Area VTI: 1.44 cm2
AV Area mean vel: 1.3 cm2
AV Mean grad: 4 mmHg
AV Peak grad: 6.3 mmHg
Ao pk vel: 1.25 m/s
Area-P 1/2: 4.49 cm2
Calc EF: 57.9 %
S' Lateral: 2.5 cm
Single Plane A2C EF: 54.7 %
Single Plane A4C EF: 60.5 %

## 2021-11-06 NOTE — Progress Notes (Unsigned)
Arne Cleveland, MD  Tamala Bari   [place] Core biopsy hepatic seg 3 new lesion  [place] look 1st for a window; not sure lesion visible on CT   DDH

## 2021-11-13 ENCOUNTER — Telehealth: Payer: Self-pay | Admitting: *Deleted

## 2021-11-13 ENCOUNTER — Other Ambulatory Visit (HOSPITAL_COMMUNITY): Payer: Self-pay | Admitting: Physician Assistant

## 2021-11-13 DIAGNOSIS — Z01818 Encounter for other preprocedural examination: Secondary | ICD-10-CM

## 2021-11-13 NOTE — H&P (Addendum)
Chief Complaint: Patient was seen in consultation today for metastatic breast cancer, liver lesion at the request of Iruku,Praveena  Referring Physician(s): Iruku,Praveena  Supervising Physician: Irish Lack  Patient Status: [facility] - Out-pt  History of Present Illness: April Rosales is a 81 y.o. female with PMH of chronic back pain, arthritis and metastatic breast cancer.  Patient was diagnosed with recurrent breast cancer metastasized to multiple sites 08/28/2014.  Patient has been followed by oncology treated with chemoradiation and surgery.  Imaging 10/18/2021 showed widespread metastatic disease to the liver appearing generally stable compared with previous study with the exception of new lesion in segment 3 measuring 1.4 x 1 cm.  Patient was referred by Dr. Rachel Moulds for hepatic lesion biopsy.  Ultrasound-guided hepatic lesion biopsy was approved by Dr. Deanne Coffer.  Past Medical History:  Diagnosis Date   Arthritis    Breast cancer [facility]) 1985/2016   takes [name] daily   Chronic back pain    stenosis/listhesis   Family history of ovarian cancer    Osteoporosis    takes Vit D   Radiation 11/23/14-12/11/14   30 Gy T1-T5     Past Surgical History:  Procedure Laterality Date   ABDOMINAL HYSTERECTOMY     1980   APPENDECTOMY  1977   APPLICATION OF INTRAOPERATIVE CT SCAN N/A 10/20/2014   Procedure: APPLICATION OF INTRAOPERATIVE CAT SCAN;  Surgeon: Aliene Beams, MD;  Location: [facility];  Service: Neurosurgery;  Laterality: N/A;   BREAST LUMPECTOMY WITH RADIOACTIVE SEED LOCALIZATION Left 05/03/2018   Procedure: LEFT BREAST LUMPECTOMY WITH BRACKETED RADIOACTIVE SEED LOCALIZATION;  Surgeon: Claud Kelp, MD;  Location: [facility];  Service: General;  Laterality: Left;   BREAST SURGERY Right 1985   THYROID CYST EXCISION  1967    Allergies: Ativan [lorazepam], Dilaudid [hydromorphone hcl], Haldol [haloperidol lactate], and Xarelto  [rivaroxaban]  Medications: Prior to Admission medications   Medication Sig Start Date End Date Taking? Authorizing Provider  apixaban (ELIQUIS) 5 MG TABS tablet Take 1 tablet (5 mg total) by mouth 2 (two) times daily. 06/21/21   Rachel Moulds, MD  Cholecalciferol (VITAMIN D3 ADULT GUMMIES PO) Take 2,000 Units by mouth daily.     [provider]  Cyanocobalamin (VITAMIN B-12 PO) Take by mouth daily. Takes every other day    [provider]  dexamethasone (DECADRON) 4 MG tablet Take 2 tablets (8 mg total) by mouth daily. Start the day after chemotherapy for 2 days. 06/11/21   Rachel Moulds, MD  Iron-Vitamins (GERITOL PO) Take by mouth daily.    [provider]  OVER THE COUNTER MEDICATION OTC Apple Cider Vinegar 1 capsule daily.    [provider]  prochlorperazine (COMPAZINE) 10 MG tablet Take 1 tablet (10 mg total) by mouth every 6 (six) hours as needed (Nausea or vomiting). 01/31/21   Magrinat, Valentino Hue, MD  traMADol (ULTRAM) 50 MG tablet Take 1 tablet (50 mg total) by mouth daily as needed. 09/18/21   Rachel Moulds, MD     Family History  Problem Relation Age of Onset   Heart disease Mother    Hypertension Mother    Heart disease Father    Diabetes Father    Breast cancer Paternal Aunt        4 paternal aunts with breast cancer over 53   Prostate cancer Paternal Uncle    Stroke Paternal Grandfather    Ovarian cancer Paternal Aunt    Huntington's disease Other  Nephew, inherited from his father    Social History   Socioeconomic History   Marital status: Divorced    Spouse name: Not on file   Number of children: 1   Years of education: Not on file   Highest education level: Not on file  Occupational History   Not on file  Tobacco Use   Smoking status: Former    Packs/day: 0.50    Years: 10.00    Total pack years: 5.00    Types: Cigarettes    Quit date: 05/03/1970    Years since quitting: 51.5   Smokeless tobacco: Former    Tobacco comments:    quit smoking 1970's  Substance and Sexual Activity   Alcohol use: Yes    Alcohol/week: 0.0 standard drinks of alcohol    Comment: Rare   Drug use: No   Sexual activity: Not on file  Other Topics Concern   Not on file  Social History Narrative   Not on file   Social Determinants of Health   Financial Resource Strain: Not on file  Food Insecurity: Not on file  Transportation Needs: Not on file  Physical Activity: Not on file  Stress: Not on file  Social Connections: Not on file    Review of Systems: A 12 point ROS discussed and pertinent positives are indicated in the HPI above.  All other systems are negative.  Review of Systems  Constitutional:  Positive for fatigue. Negative for chills and fever.  Respiratory:  Negative for shortness of breath.   Cardiovascular:  Negative for chest pain and leg swelling.  Gastrointestinal:  Negative for abdominal pain, nausea and vomiting.  Neurological:  Negative for dizziness, weakness and headaches.    Vital Signs: Ht 5\' 1"  (1.549 m)   Wt 105 lb (47.6 kg)   BMI 19.84 kg/m      Physical Exam Vitals reviewed.  Constitutional:      General: She is not in acute distress.    Appearance: Normal appearance. She is ill-appearing.  HENT:     Head: Normocephalic and atraumatic.     Mouth/Throat:     Mouth: Mucous membranes are dry.     Pharynx: Oropharynx is clear.  Eyes:     Extraocular Movements: Extraocular movements intact.     Pupils: Pupils are equal, round, and reactive to light.  Cardiovascular:     Rate and Rhythm: Normal rate and regular rhythm.     Pulses: Normal pulses.     Heart sounds: Normal heart sounds. No murmur heard. Pulmonary:     Effort: Pulmonary effort is normal. No respiratory distress.     Breath sounds: Normal breath sounds.  Abdominal:     General: Bowel sounds are normal. There is no distension.     Palpations: Abdomen is soft.     Tenderness: There is no abdominal  tenderness. There is no guarding.  Musculoskeletal:     Right lower leg: No edema.     Left lower leg: No edema.  Skin:    General: Skin is warm and dry.  Neurological:     Mental Status: She is alert and oriented to person, place, and time.  Psychiatric:        Mood and Affect: Mood normal.        Behavior: Behavior normal.        Thought Content: Thought content normal.        Judgment: Judgment normal.     Imaging: ECHOCARDIOGRAM COMPLETE  Result Date:  11/05/2021    ECHOCARDIOGRAM REPORT   Patient Name:   PAXTYN WISDOM Date of Exam: 11/05/2021 Medical Rec #:  [id number]        Height:       61.0 in Accession #:    [id number]       Weight:       109.0 lb Date of Birth:  [birth year]         BSA:          1.459 m Patient Age:    81 years         BP:           166/90 mmHg Patient Gender: F                HR:           103 bpm. Exam Location:  Outpatient Procedure: 2D Echo, Color Doppler, Cardiac Doppler and Strain Analysis Indications:    Chemo  History:        Patient has prior history of Echocardiogram examinations, most                 recent 07/30/2021. Breast cancer.  Sonographer:    Rodrigo Ran RCS Referring Phys: [id number] PRAVEENA IRUKU  Sonographer Comments: Suboptimal subcostal window. Global longitudinal strain was attempted. IMPRESSIONS  1. Left ventricular ejection fraction, by estimation, is 55 to 60%. The left ventricle has normal function. The left ventricle has no regional wall motion abnormalities. Left ventricular diastolic parameters are consistent with Grade I diastolic dysfunction (impaired relaxation). GLS not accurate due to poor endocardial tracking. LV function appears stable.  2. Right ventricular systolic function is normal. The right ventricular size is normal.  3. The mitral valve is normal in structure. No evidence of mitral valve regurgitation. No evidence of mitral stenosis.  4. The aortic valve is tricuspid. There is mild calcification of the aortic valve. Aortic  valve regurgitation is not visualized. Aortic valve sclerosis/calcification is present, without any evidence of aortic stenosis.  5. The inferior vena cava is normal in size with greater than 50% respiratory variability, suggesting right atrial pressure of 3 mmHg. FINDINGS  Left Ventricle: Left ventricular ejection fraction, by estimation, is 55 to 60%. The left ventricle has normal function. The left ventricle has no regional wall motion abnormalities. The left ventricular internal cavity size was normal in size. There is  no left ventricular hypertrophy. Left ventricular diastolic parameters are consistent with Grade I diastolic dysfunction (impaired relaxation). Right Ventricle: The right ventricular size is normal. No increase in right ventricular wall thickness. Right ventricular systolic function is normal. Left Atrium: Left atrial size was normal in size. Right Atrium: Right atrial size was normal in size. Pericardium: There is no evidence of pericardial effusion. Mitral Valve: The mitral valve is normal in structure. No evidence of mitral valve regurgitation. No evidence of mitral valve stenosis. Tricuspid Valve: The tricuspid valve is normal in structure. Tricuspid valve regurgitation is trivial. No evidence of tricuspid stenosis. Aortic Valve: The aortic valve is tricuspid. There is mild calcification of the aortic valve. Aortic valve regurgitation is not visualized. Aortic valve sclerosis/calcification is present, without any evidence of aortic stenosis. Aortic valve mean gradient measures 4.0 mmHg. Aortic valve peak gradient measures 6.2 mmHg. Aortic valve area, by VTI measures 1.44 cm. Pulmonic Valve: The pulmonic valve was not well visualized. Pulmonic valve regurgitation is mild. No evidence of pulmonic stenosis. Aorta: The aortic root is normal in size and structure. Venous: The inferior  vena cava is normal in size with greater than 50% respiratory variability, suggesting right atrial pressure of 3  mmHg. IAS/Shunts: No atrial level shunt detected by color flow Doppler.  LEFT VENTRICLE PLAX 2D LVIDd:         3.65 cm     Diastology LVIDs:         2.50 cm     LV e' medial:    5.00 cm/s LV PW:         0.55 cm     LV E/e' medial:  13.8 LV IVS:        0.80 cm     LV e' lateral:   6.53 cm/s LVOT diam:     1.75 cm     LV E/e' lateral: 10.6 LV SV:         30 LV SV Index:   20 LVOT Area:     2.41 cm  LV Volumes (MOD) LV vol d, MOD A2C: 46.4 ml LV vol d, MOD A4C: 68.8 ml LV vol s, MOD A2C: 21.0 ml LV vol s, MOD A4C: 27.2 ml LV SV MOD A2C:     25.4 ml LV SV MOD A4C:     68.8 ml LV SV MOD BP:      32.8 ml RIGHT VENTRICLE RV Basal diam:  2.90 cm RV Mid diam:    2.30 cm RV S prime:     10.40 cm/s TAPSE (M-mode): 2.0 cm LEFT ATRIUM             Index        RIGHT ATRIUM           Index LA diam:        2.30 cm 1.58 cm/m   RA Area:     10.70 cm LA Vol (A2C):   26.2 ml 17.95 ml/m  RA Volume:   24.00 ml  16.45 ml/m LA Vol (A4C):   18.7 ml 12.81 ml/m LA Biplane Vol: 23.9 ml 16.38 ml/m  AORTIC VALVE                    PULMONIC VALVE AV Area (Vmax):    1.35 cm     PV Vmax:          0.95 m/s AV Area (Vmean):   1.30 cm     PV Peak grad:     3.6 mmHg AV Area (VTI):     1.44 cm     PR End Diast Vel: 5.57 msec AV Vmax:           125.00 cm/s AV Vmean:          86.900 cm/s AV VTI:            0.206 m AV Peak Grad:      6.2 mmHg AV Mean Grad:      4.0 mmHg LVOT Vmax:         70.40 cm/s LVOT Vmean:        47.100 cm/s LVOT VTI:          0.123 m LVOT/AV VTI ratio: 0.60  AORTA Ao Root diam: 2.90 cm Ao Asc diam:  2.50 cm MITRAL VALVE                TRICUSPID VALVE MV Area (PHT): 4.49 cm     TR Peak grad:   25.0 mmHg MV Decel Time: 169 msec     TR Vmax:        250.00  cm/s MV E velocity: 69.20 cm/s MV A velocity: 133.00 cm/s  SHUNTS MV E/A ratio:  0.52         Systemic VTI:  0.12 m                             Systemic Diam: 1.75 cm Arvilla Meres MD Electronically signed by Arvilla Meres MD Signature Date/Time: 11/05/2021/12:13:27 PM     Final    MR Abdomen W Wo Contrast  Result Date: 10/21/2021 CLINICAL DATA:  81 year old female with history of breast cancer with known metastatic disease to the liver. Follow-up study. EXAM: MRI ABDOMEN WITHOUT AND WITH CONTRAST TECHNIQUE: Multiplanar multisequence MR imaging of the abdomen was performed both before and after the administration of intravenous contrast. CONTRAST:  5mL GADAVIST GADOBUTROL 1 MMOL/ML IV SOLN COMPARISON:  Multiple priors, most recently MRI of the abdomen 07/17/2021. FINDINGS: Lower chest: Susceptibility artifact in the right axillary region, likely from prior lymph node dissection. Hepatobiliary: Again noted are several aggressive appearing hepatic lesions which are T1 hypointense, mildly T2 hyperintense, demonstrate restricted diffusion and variable degrees of enhancement on post gadolinium imaging. The largest of these is in the central aspect of the liver involving portions of segments 4A and 8 (axial image 19 of series 35) measuring approximately 4.2 x 2.7 cm on today's study, similar to the prior examination. Several other smaller lesions are also generally unchanged compared to the prior examination, with exception of a new lesion in segment 3 of the liver, best appreciated on axial image 35 of series 12 and image 55 of series 6 measuring 1.4 x 1.0 cm. No intra or extrahepatic biliary ductal dilatation. Numerous filling defects within the gallbladder, compatible with gallstones. Gallbladder is only mildly distended. Gallbladder wall thickness is normal. No pericholecystic fluid or surrounding inflammatory changes. Pancreas: No pancreatic mass. No pancreatic ductal dilatation. No pancreatic or peripancreatic fluid collections or inflammatory changes. Spleen:  Unremarkable. Adrenals/Urinary Tract: 1 cm T1 hypointense, T2 hyperintense, nonenhancing lesion in the upper pole of the left kidney, compatible with a simple cyst. Right kidney and bilateral adrenal glands are normal in  appearance. No hydroureteronephrosis in the visualized portions of the abdomen. Stomach/Bowel: Visualized portions are unremarkable. Vascular/Lymphatic: No aneurysm identified in the visualized abdominal vasculature. No lymphadenopathy noted in the abdomen. Other: No significant volume of ascites noted in the visualized portions of the peritoneal cavity. Musculoskeletal: No aggressive appearing osseous lesions are noted in the visualized portions of the skeleton. IMPRESSION: 1. Widespread metastatic disease to the liver appears generally stable compared to the prior study, with exception of a new lesion in segment 3 of the liver which measures 1.4 x 1.0 cm, as detailed above. 2. Cholelithiasis without evidence of acute cholecystitis at this time. Electronically Signed   By: Trudie Reed M.D.   On: 10/21/2021 07:52    Labs:  CBC: Recent Labs    09/18/21 1328 10/08/21 1222 10/30/21 1243 11/14/21 1150  WBC 4.0 4.3 4.1 4.5  HGB 14.0 14.2 14.6 14.1  HCT 41.4 40.7 42.0 41.9  PLT 283 280 266 300    COAGS: Recent Labs    11/14/21 1150  INR 1.0    BMP: Recent Labs    08/28/21 1316 09/18/21 1328 10/08/21 1222 10/30/21 1243  NA 139 141 142 141  K 3.8 4.0 3.7 3.8  CL 105 109 107 107  CO2 29 29 30 30   GLUCOSE 102* 114* 106* 118*  BUN 7*  8 8 9   CALCIUM 9.7 9.7 10.0 10.3  CREATININE 0.55 0.51 0.58 0.52  GFRNONAA >60 >60 >60 >60    LIVER FUNCTION TESTS: Recent Labs    08/28/21 1316 09/18/21 1328 10/08/21 1222 10/30/21 1243  BILITOT 0.4 0.3 0.4 0.4  AST 21 22 22 20   ALT 12 13 13 10   ALKPHOS 39 39 39 39  PROT 6.1* 6.2* 6.3* 6.3*  ALBUMIN 3.5 3.6 3.6 3.6    TUMOR MARKERS: No results for input(s): "AFPTM", "CEA", "CA199", "CHROMGRNA" in the last 8760 hours.  Assessment and Plan: History of chronic back pain, arthritis and metastatic breast cancer.  Patient was diagnosed with recurrent breast cancer metastasized to multiple sites 08/28/2014.  Patient has been followed by  oncology treated with chemoradiation and surgery.  Imaging 10/18/2021 showed widespread metastatic disease to the liver appearing generally stable compared with previous study with the exception of new lesion in segment 3 measuring 1.4 x 1 cm.  Patient was referred by Dr. Rachel Moulds for hepatic lesion biopsy.  Ultrasound-guided hepatic lesion biopsy was approved by Dr. Deanne Coffer.  Pt resting on stretcher.  She is A&O, calm and pleasant.  She is in no distress.  Pt states last Eliquis was 1 week ago.  She denies the use of ASA.  She is NPO per order.   Risks and benefits of hepatic lesion biopsy with moderate sedation was discussed with the patient and/or patient's family including, but not limited to bleeding, infection, damage to adjacent structures or low yield requiring additional tests.  All of the questions were answered and there is agreement to proceed.  Consent signed and in chart.   Thank you for this interesting consult.  I greatly enjoyed meeting April Rosales and look forward to participating in their care.  A copy of this report was sent to the requesting provider on this date.  Electronically Signed: Shon Hough, NP 11/14/2021, 12:51 PM   I spent a total of 20 minutes in face to face in clinical consultation, greater than 50% of which was counseling/coordinating care for liver lesion.

## 2021-11-13 NOTE — Telephone Encounter (Signed)
This RN  spoke with pt per inquiry about use of apixaban noted on med list- note pt informed MD she is " not taking it every day because I was getting nose bleeds"  April Rosales verified she has not taken it for 5 days " I usually only take it 1 time a week "  This RN informed her due to pending biopsy tomorrow- she should not take any this week with pt agreeing.

## 2021-11-14 ENCOUNTER — Other Ambulatory Visit: Payer: Self-pay

## 2021-11-14 ENCOUNTER — Ambulatory Visit (HOSPITAL_COMMUNITY)
Admission: RE | Admit: 2021-11-14 | Discharge: 2021-11-14 | Disposition: A | Discharge status: Home / Self Care | Payer: Medicare Other | Source: Ambulatory Visit | Attending: Hematology and Oncology | Admitting: Hematology and Oncology

## 2021-11-14 ENCOUNTER — Encounter (HOSPITAL_COMMUNITY): Payer: Self-pay

## 2021-11-14 ENCOUNTER — Other Ambulatory Visit: Payer: Self-pay | Admitting: Hematology and Oncology

## 2021-11-14 DIAGNOSIS — C50412 Malignant neoplasm of upper-outer quadrant of left female breast: Secondary | ICD-10-CM | POA: Diagnosis not present

## 2021-11-14 DIAGNOSIS — Z170 Estrogen receptor positive status [ER+]: Secondary | ICD-10-CM | POA: Diagnosis not present

## 2021-11-14 DIAGNOSIS — R5383 Other fatigue: Secondary | ICD-10-CM | POA: Insufficient documentation

## 2021-11-14 DIAGNOSIS — G8929 Other chronic pain: Secondary | ICD-10-CM | POA: Insufficient documentation

## 2021-11-14 DIAGNOSIS — C787 Secondary malignant neoplasm of liver and intrahepatic bile duct: Secondary | ICD-10-CM | POA: Insufficient documentation

## 2021-11-14 DIAGNOSIS — K7689 Other specified diseases of liver: Secondary | ICD-10-CM | POA: Diagnosis not present

## 2021-11-14 DIAGNOSIS — M1990 Unspecified osteoarthritis, unspecified site: Secondary | ICD-10-CM | POA: Insufficient documentation

## 2021-11-14 DIAGNOSIS — Z9221 Personal history of antineoplastic chemotherapy: Secondary | ICD-10-CM | POA: Insufficient documentation

## 2021-11-14 DIAGNOSIS — C50919 Malignant neoplasm of unspecified site of unspecified female breast: Secondary | ICD-10-CM | POA: Diagnosis not present

## 2021-11-14 DIAGNOSIS — M549 Dorsalgia, unspecified: Secondary | ICD-10-CM | POA: Insufficient documentation

## 2021-11-14 DIAGNOSIS — Z853 Personal history of malignant neoplasm of breast: Secondary | ICD-10-CM | POA: Diagnosis not present

## 2021-11-14 DIAGNOSIS — Z01818 Encounter for other preprocedural examination: Secondary | ICD-10-CM | POA: Diagnosis not present

## 2021-11-14 DIAGNOSIS — Z9889 Other specified postprocedural states: Secondary | ICD-10-CM | POA: Diagnosis not present

## 2021-11-14 DIAGNOSIS — Z923 Personal history of irradiation: Secondary | ICD-10-CM | POA: Diagnosis not present

## 2021-11-14 LAB — CBC
HCT: 41.9 % (ref 36.0–46.0)
Hemoglobin: 14.1 g/dL (ref 12.0–15.0)
MCH: 33.1 pg (ref 26.0–34.0)
MCHC: 33.7 g/dL (ref 30.0–36.0)
MCV: 98.4 fL (ref 80.0–100.0)
Platelets: 300 10*3/uL (ref 150–400)
RBC: 4.26 MIL/uL (ref 3.87–5.11)
RDW: 15 % (ref 11.5–15.5)
WBC: 4.5 10*3/uL (ref 4.0–10.5)
nRBC: 0 % (ref 0.0–0.2)

## 2021-11-14 LAB — PROTIME-INR
INR: 1 (ref 0.8–1.2)
Prothrombin Time: 12.9 seconds (ref 11.4–15.2)

## 2021-11-14 MED ORDER — FENTANYL CITRATE (PF) 100 MCG/2ML IJ SOLN
INTRAMUSCULAR | Status: AC | PRN
Start: 2021-11-14 — End: 2021-11-14
  Administered 2021-11-14: 50 ug via INTRAVENOUS

## 2021-11-14 MED ORDER — FENTANYL CITRATE (PF) 100 MCG/2ML IJ SOLN
INTRAMUSCULAR | Status: AC
  Filled 2021-11-14: qty 4

## 2021-11-14 MED ORDER — FLUMAZENIL 0.5 MG/5ML IV SOLN
INTRAVENOUS | Status: AC
  Filled 2021-11-14: qty 5

## 2021-11-14 MED ORDER — SODIUM CHLORIDE 0.9 % IV SOLN: INTRAVENOUS | Status: DC

## 2021-11-14 MED ORDER — NALOXONE HCL 0.4 MG/ML IJ SOLN
INTRAMUSCULAR | Status: AC
  Filled 2021-11-14: qty 1

## 2021-11-14 MED ORDER — GELATIN ABSORBABLE 12-7 MM EX MISC
CUTANEOUS | Status: AC
  Filled 2021-11-14: qty 1

## 2021-11-14 MED ORDER — LIDOCAINE HCL 1 % IJ SOLN
INTRAMUSCULAR | Status: AC
  Filled 2021-11-14: qty 20

## 2021-11-14 MED ORDER — MIDAZOLAM HCL 2 MG/2ML IJ SOLN
INTRAMUSCULAR | Status: AC
  Filled 2021-11-14: qty 4

## 2021-11-14 NOTE — Sedation Documentation (Signed)
No procedure completed at this time as no lesion present on [place] per Dr. Kathlene Cote.

## 2021-11-14 NOTE — Discharge Instructions (Signed)
Please call Interventional Radiology clinic [phone] with any questions or concerns.   You may remove your dressing and shower tomorrow.   Liver Biopsy, Care After After a liver biopsy, it is common to have these things in the area where the biopsy was done. You may: Have pain. Feel sore. Have bruising. You may also feel tired for a few days. Follow these instructions at home: Medicines Take over-the-counter and prescription medicines only as told by your doctor. If you were prescribed an antibiotic medicine, take it as told by your doctor. Do not stop taking the antibiotic, even if you start to feel better. Do not take medicines that may thin your blood. These medicines include aspirin and ibuprofen. Take them only if your doctor tells you to. If told, take steps to prevent problems with pooping (constipation). You may need to: Drink enough fluid to keep your pee (urine) pale yellow. Take medicines. You will be told what medicines to take. Eat foods that are high in fiber. These include beans, whole grains, and fresh fruits and vegetables. Limit foods that are high in fat and sugar. These include fried or sweet foods. Ask your doctor if you should avoid driving or using machines while you are taking your medicine. Caring for your incision Follow instructions from your doctor about how to take care of your cut from surgery (incisions). Make sure you: Wash your hands with soap and water for at least 20 seconds before and after you change your bandage. If you cannot use soap and water, use hand sanitizer. Change your bandage. Leavestitches or skin glue in place for at least two weeks. Leave tape strips alone unless you are told to take them off. You may trim the edges of the tape strips if they curl up. Check your incision every day for signs of infection. Check for: Redness, swelling, or more pain. Fluid or blood. Warmth. Pus or a bad smell. Do not take baths, swim, or use a  hot tub. Ask your doctor about taking showers or sponge baths. Activity Rest at home for 1-2 days, or as told by your doctor. Get up to take short walks every 1 to 2 hours. Ask for help if you feel weak or unsteady. Do not lift anything that is heavier than 10 lb (4.5 kg), or the limit that you are told. Do not play contact sports for 2 weeks after the procedure. Return to your normal activities as told by your doctor. Ask what activities are safe for you. General instructions  Do not drink alcohol in the first week after the procedure. Plan to have a responsible adult care for you for the time you are told after you leave the hospital or clinic. This is important. It is up to you to get the results of your procedure. Ask how to get your results when they are ready. Keep all follow-up visits.   Contact a doctor if: You have more bleeding in your incision. Your incision swells, or is red and more painful. You have fluid that comes from your incision. You develop a rash. You have fever or chills. Get help right away if: You have swelling, bloating, or pain in your belly (abdomen). You get dizzy or faint. You vomit or you feel like vomiting. You have trouble breathing or feel short of breath. You have chest pain. You have problems talking or seeing. You have trouble with your balance or moving your arms or legs. These symptoms may be an emergency. Get help   right away. Call your local emergency services (911 in the [place] Do not wait to see if the symptoms will go away. Do not drive yourself to the hospital. Summary After the procedure, it is common to have pain, soreness, bruising, and tiredness. Your doctor will tell you how to take care of yourself at home. Change your bandage, take your medicines, and limit your activities as told by your doctor. Call your doctor if you have symptoms of infection. Get help right away if your belly swells, your cut bleeds a lot, or you have trouble  talking or breathing. This information is not intended to replace advice given to you by your health care provider. Make sure you discuss any questions you have with your healthcare provider. Document Revised: 03/26/2020 Document Reviewed: 03/26/2020 Elsevier Patient Education  2022 Elsevier Inc.     Moderate Conscious Sedation, Adult, Care After This sheet gives you information about how to care for yourself after your procedure. Your health care provider may also give you more specific instructions. If you have problems or questions, contact your health care provider. What can I expect after the procedure? After the procedure, it is common to have: Sleepiness for several hours. Impaired judgment for several hours. Difficulty with balance. Vomiting if you eat too soon. Follow these instructions at home: For the time period you were told by your health care provider: Rest. Do not participate in activities where you could fall or become injured. Do not drive or use machinery. Do not drink alcohol. Do not take sleeping pills or medicines that cause drowsiness. Do not make important decisions or sign legal documents. Do not take care of children on your own.        Eating and drinking Follow the diet recommended by your health care provider. Drink enough fluid to keep your urine pale yellow. If you vomit: Drink water, juice, or soup when you can drink without vomiting. Make sure you have little or no nausea before eating solid foods.    General instructions Take over-the-counter and prescription medicines only as told by your health care provider. Have a responsible adult stay with you for the time you are told. It is important to have someone help care for you until you are awake and alert. Do not smoke. Keep all follow-up visits as told by your health care provider. This is important. Contact a health care provider if: You are still sleepy or having trouble with balance after  24 hours. You feel light-headed. You keep feeling nauseous or you keep vomiting. You develop a rash. You have a fever. You have redness or swelling around the IV site. Get help right away if: You have trouble breathing. You have new-onset confusion at home. Summary After the procedure, it is common to feel sleepy, have impaired judgment, or feel nauseous if you eat too soon. Rest after you get home. Know the things you should not do after the procedure. Follow the diet recommended by your health care provider and drink enough fluid to keep your urine pale yellow. Get help right away if you have trouble breathing or new-onset confusion at home. This information is not intended to replace advice given to you by your health care provider. Make sure you discuss any questions you have with your health care provider. Document Revised: 09/09/2019 Document Reviewed: 04/07/2019 Elsevier Patient Education  2021 Elsevier Inc.    

## 2021-11-14 NOTE — Procedures (Signed)
Interventional Radiology Progress Note  By ultrasound, no left lobe liver lesion identified. Right lobe lesions are clearly smaller compared to MRI from last month.  Biopsy cancelled.  April Rosales. Kathlene Cote, M.D Pager:  [phone]

## 2021-11-19 ENCOUNTER — Inpatient Hospital Stay (HOSPITAL_BASED_OUTPATIENT_CLINIC_OR_DEPARTMENT_OTHER): Payer: Medicare Other | Admitting: Hematology and Oncology

## 2021-11-19 ENCOUNTER — Encounter: Payer: Self-pay | Admitting: Hematology and Oncology

## 2021-11-19 ENCOUNTER — Other Ambulatory Visit: Payer: Self-pay

## 2021-11-19 ENCOUNTER — Inpatient Hospital Stay: Payer: Medicare Other

## 2021-11-19 VITALS — HR 99

## 2021-11-19 DIAGNOSIS — C50412 Malignant neoplasm of upper-outer quadrant of left female breast: Secondary | ICD-10-CM

## 2021-11-19 DIAGNOSIS — C787 Secondary malignant neoplasm of liver and intrahepatic bile duct: Secondary | ICD-10-CM | POA: Diagnosis not present

## 2021-11-19 DIAGNOSIS — Z170 Estrogen receptor positive status [ER+]: Secondary | ICD-10-CM

## 2021-11-19 DIAGNOSIS — Z5112 Encounter for antineoplastic immunotherapy: Secondary | ICD-10-CM | POA: Diagnosis not present

## 2021-11-19 DIAGNOSIS — C7951 Secondary malignant neoplasm of bone: Secondary | ICD-10-CM

## 2021-11-19 DIAGNOSIS — C50919 Malignant neoplasm of unspecified site of unspecified female breast: Secondary | ICD-10-CM

## 2021-11-19 LAB — CMP (CANCER CENTER ONLY)
ALT: 11 U/L (ref 0–44)
AST: 19 U/L (ref 15–41)
Albumin: 3.5 g/dL (ref 3.5–5.0)
Alkaline Phosphatase: 37 U/L — ABNORMAL LOW (ref 38–126)
Anion gap: 3 — ABNORMAL LOW (ref 5–15)
BUN: 7 mg/dL — ABNORMAL LOW (ref 8–23)
CO2: 28 mmol/L (ref 22–32)
Calcium: 9.6 mg/dL (ref 8.9–10.3)
Chloride: 108 mmol/L (ref 98–111)
Creatinine: 0.53 mg/dL (ref 0.44–1.00)
GFR, Estimated: >60 mL/min (ref >60)
Glucose, Bld: 112 mg/dL — ABNORMAL HIGH (ref 70–99)
Potassium: 3.8 mmol/L (ref 3.5–5.1)
Sodium: 139 mmol/L (ref 135–145)
Total Bilirubin: 0.4 mg/dL (ref 0.3–1.2)
Total Protein: 5.9 g/dL — ABNORMAL LOW (ref 6.5–8.1)

## 2021-11-19 LAB — CBC WITH DIFFERENTIAL (CANCER CENTER ONLY)
Abs Immature Granulocytes: 0.01 10*3/uL (ref 0.00–0.07)
Basophils Absolute: 0.1 10*3/uL (ref 0.0–0.1)
Basophils Relative: 1 %
Eosinophils Absolute: 0.2 10*3/uL (ref 0.0–0.5)
Eosinophils Relative: 5 %
HCT: 37.8 % (ref 36.0–46.0)
Hemoglobin: 13.2 g/dL (ref 12.0–15.0)
Immature Granulocytes: 0 %
Lymphocytes Relative: 26 %
Lymphs Abs: 1.2 10*3/uL (ref 0.7–4.0)
MCH: 33.6 pg (ref 26.0–34.0)
MCHC: 34.9 g/dL (ref 30.0–36.0)
MCV: 96.2 fL (ref 80.0–100.0)
Monocytes Absolute: 0.6 10*3/uL (ref 0.1–1.0)
Monocytes Relative: 12 %
Neutro Abs: 2.6 10*3/uL (ref 1.7–7.7)
Neutrophils Relative %: 56 %
Platelet Count: 302 10*3/uL (ref 150–400)
RBC: 3.93 MIL/uL (ref 3.87–5.11)
RDW: 15 % (ref 11.5–15.5)
WBC Count: 4.6 10*3/uL (ref 4.0–10.5)
nRBC: 0 % (ref 0.0–0.2)

## 2021-11-19 MED ORDER — DEXTROSE 5 % IV SOLN: Freq: Once | INTRAVENOUS | Status: AC

## 2021-11-19 MED ORDER — FAM-TRASTUZUMAB DERUXTECAN-NXKI CHEMO 100 MG IV SOLR
4.4000 mg/kg | Freq: Once | INTRAVENOUS | Status: AC
  Administered 2021-11-19: 232 mg via INTRAVENOUS
  Filled 2021-11-19: qty 11.6

## 2021-11-19 MED ORDER — ACETAMINOPHEN 325 MG PO TABS
650.0000 mg | ORAL_TABLET | Freq: Once | ORAL | Status: AC
  Administered 2021-11-19: 650 mg via ORAL
  Filled 2021-11-19: qty 2

## 2021-11-19 MED ORDER — DIPHENHYDRAMINE HCL 25 MG PO CAPS
25.0000 mg | ORAL_CAPSULE | Freq: Once | ORAL | Status: AC
  Administered 2021-11-19: 25 mg via ORAL
  Filled 2021-11-19: qty 1

## 2021-11-19 MED ORDER — PALONOSETRON HCL INJECTION 0.25 MG/5ML
0.2500 mg | Freq: Once | INTRAVENOUS | Status: AC
  Administered 2021-11-19: 0.25 mg via INTRAVENOUS
  Filled 2021-11-19: qty 5

## 2021-11-19 MED ORDER — SODIUM CHLORIDE 0.9 % IV SOLN
10.0000 mg | Freq: Once | INTRAVENOUS | Status: AC
  Administered 2021-11-19: 10 mg via INTRAVENOUS
  Filled 2021-11-19: qty 10

## 2021-12-05 ENCOUNTER — Telehealth: Payer: Self-pay | Admitting: *Deleted

## 2021-12-05 MED ORDER — CIPROFLOXACIN HCL 500 MG PO TABS: 500.0000 mg | ORAL_TABLET | Freq: Two times a day (BID) | ORAL | 0 refills | Status: DC

## 2021-12-05 NOTE — Telephone Encounter (Signed)
Pt called stating onset of " I think I have a UTI ".  April Rosales states she is having increased frequency of urination with burning.  She denies any fever or chills.  Note she states difficulty in coming in today and is asking if prescription can be sent over to her pharmacy.  Per review with MD - prescription given for cipro- this RN called pt and discussed including verifying her pharmacy.  Per discussion pt verbalized back to this RN that if symptoms persist post starting the abx or she develops a fever or chills she is to call this office.

## 2021-12-10 ENCOUNTER — Inpatient Hospital Stay (HOSPITAL_BASED_OUTPATIENT_CLINIC_OR_DEPARTMENT_OTHER): Payer: Medicare Other | Admitting: Adult Health

## 2021-12-10 ENCOUNTER — Inpatient Hospital Stay: Payer: Medicare Other

## 2021-12-10 ENCOUNTER — Inpatient Hospital Stay: Payer: Medicare Other | Attending: Oncology

## 2021-12-10 ENCOUNTER — Encounter: Payer: Self-pay | Admitting: Hematology and Oncology

## 2021-12-10 ENCOUNTER — Other Ambulatory Visit: Payer: Self-pay

## 2021-12-10 ENCOUNTER — Encounter: Payer: Self-pay | Admitting: Adult Health

## 2021-12-10 VITALS — BP 153/81 | HR 100 | Temp 98.4°F | Resp 18 | Ht 61.0 in | Wt 108.0 lb

## 2021-12-10 DIAGNOSIS — C7951 Secondary malignant neoplasm of bone: Secondary | ICD-10-CM | POA: Insufficient documentation

## 2021-12-10 DIAGNOSIS — Z170 Estrogen receptor positive status [ER+]: Secondary | ICD-10-CM

## 2021-12-10 DIAGNOSIS — C50919 Malignant neoplasm of unspecified site of unspecified female breast: Secondary | ICD-10-CM

## 2021-12-10 DIAGNOSIS — C787 Secondary malignant neoplasm of liver and intrahepatic bile duct: Secondary | ICD-10-CM | POA: Insufficient documentation

## 2021-12-10 DIAGNOSIS — C50412 Malignant neoplasm of upper-outer quadrant of left female breast: Secondary | ICD-10-CM | POA: Insufficient documentation

## 2021-12-10 DIAGNOSIS — Z5112 Encounter for antineoplastic immunotherapy: Secondary | ICD-10-CM | POA: Insufficient documentation

## 2021-12-10 LAB — CBC WITH DIFFERENTIAL (CANCER CENTER ONLY)
Abs Immature Granulocytes: 0.01 10*3/uL (ref 0.00–0.07)
Basophils Absolute: 0.1 10*3/uL (ref 0.0–0.1)
Basophils Relative: 1 %
Eosinophils Absolute: 0.2 10*3/uL (ref 0.0–0.5)
Eosinophils Relative: 4 %
HCT: 40 % (ref 36.0–46.0)
Hemoglobin: 13.7 g/dL (ref 12.0–15.0)
Immature Granulocytes: 0 %
Lymphocytes Relative: 19 %
Lymphs Abs: 1 10*3/uL (ref 0.7–4.0)
MCH: 33.6 pg (ref 26.0–34.0)
MCHC: 34.3 g/dL (ref 30.0–36.0)
MCV: 98 fL (ref 80.0–100.0)
Monocytes Absolute: 0.6 10*3/uL (ref 0.1–1.0)
Monocytes Relative: 12 %
Neutro Abs: 3.3 10*3/uL (ref 1.7–7.7)
Neutrophils Relative %: 64 %
Platelet Count: 282 10*3/uL (ref 150–400)
RBC: 4.08 MIL/uL (ref 3.87–5.11)
RDW: 15 % (ref 11.5–15.5)
WBC Count: 5.2 10*3/uL (ref 4.0–10.5)
nRBC: 0 % (ref 0.0–0.2)

## 2021-12-10 LAB — CMP (CANCER CENTER ONLY)
ALT: 13 U/L (ref 0–44)
AST: 25 U/L (ref 15–41)
Albumin: 3.6 g/dL (ref 3.5–5.0)
Alkaline Phosphatase: 37 U/L — ABNORMAL LOW (ref 38–126)
Anion gap: 4 — ABNORMAL LOW (ref 5–15)
BUN: 8 mg/dL (ref 8–23)
CO2: 27 mmol/L (ref 22–32)
Calcium: 9.5 mg/dL (ref 8.9–10.3)
Chloride: 109 mmol/L (ref 98–111)
Creatinine: 0.5 mg/dL (ref 0.44–1.00)
GFR, Estimated: >60 mL/min (ref >60)
Glucose, Bld: 101 mg/dL — ABNORMAL HIGH (ref 70–99)
Potassium: 3.7 mmol/L (ref 3.5–5.1)
Sodium: 140 mmol/L (ref 135–145)
Total Bilirubin: 0.5 mg/dL (ref 0.3–1.2)
Total Protein: 6.2 g/dL — ABNORMAL LOW (ref 6.5–8.1)

## 2021-12-10 MED ORDER — ACETAMINOPHEN 325 MG PO TABS
ORAL_TABLET | ORAL | Status: AC
  Filled 2021-12-10: qty 1

## 2021-12-10 MED ORDER — FAM-TRASTUZUMAB DERUXTECAN-NXKI CHEMO 100 MG IV SOLR
4.4000 mg/kg | Freq: Once | INTRAVENOUS | Status: AC
  Administered 2021-12-10: 232 mg via INTRAVENOUS
  Filled 2021-12-10: qty 11.6

## 2021-12-10 MED ORDER — SODIUM CHLORIDE 0.9 % IV SOLN
10.0000 mg | Freq: Once | INTRAVENOUS | Status: AC
  Administered 2021-12-10: 10 mg via INTRAVENOUS
  Filled 2021-12-10: qty 10

## 2021-12-10 MED ORDER — DEXTROSE 5 % IV SOLN: Freq: Once | INTRAVENOUS | Status: AC

## 2021-12-10 MED ORDER — HEPARIN SOD (PORK) LOCK FLUSH 100 UNIT/ML IV SOLN: 500.0000 [IU] | Freq: Once | INTRAVENOUS | Status: DC | PRN

## 2021-12-10 MED ORDER — DIPHENHYDRAMINE HCL 25 MG PO CAPS
25.0000 mg | ORAL_CAPSULE | Freq: Once | ORAL | Status: AC
  Administered 2021-12-10: 25 mg via ORAL
  Filled 2021-12-10: qty 1

## 2021-12-10 MED ORDER — SODIUM CHLORIDE 0.9% FLUSH: 10.0000 mL | INTRAVENOUS | Status: DC | PRN

## 2021-12-10 MED ORDER — ACETAMINOPHEN 325 MG PO TABS
650.0000 mg | ORAL_TABLET | Freq: Once | ORAL | Status: AC
  Administered 2021-12-10: 650 mg via ORAL
  Filled 2021-12-10: qty 2

## 2021-12-10 MED ORDER — PALONOSETRON HCL INJECTION 0.25 MG/5ML
0.2500 mg | Freq: Once | INTRAVENOUS | Status: AC
  Administered 2021-12-10: 0.25 mg via INTRAVENOUS
  Filled 2021-12-10: qty 5

## 2021-12-10 MED ORDER — DEXAMETHASONE 4 MG PO TABS: 8.0000 mg | ORAL_TABLET | Freq: Every day | ORAL | 1 refills | Status: DC

## 2021-12-10 NOTE — Patient Instructions (Signed)
[facility] MEDICAL [facility]COL[facility]  Discharge Instructions: ?Thank you for choosing [facility] to provide your oncology and hematology care.  ? ?If you have a lab appointment with the Cancer Center, please go directly to the Cancer Center and check in at the registration area. ?  ?Wear comfortable clothing and clothing appropriate for easy access to any Portacath or PICC line.  ? ?We strive to give you quality time with your provider. You may need to reschedule your appointment if you arrive late (15 or more minutes).  Arriving late affects you and other patients whose appointments are after yours.  Also, if you miss three or more appointments without notifying the office, you may be dismissed from the clinic at the provider?s discretion.    ?  ?For prescription refill requests, have your pharmacy contact our office and allow 72 hours for refills to be completed.   ? ?Today you received the following chemotherapy and/or immunotherapy agents: Enhertu.    ?  ?To help prevent nausea and vomiting after your treatment, we encourage you to take your nausea medication as directed. ? ?BELOW ARE SYMPTOMS THAT SHOULD BE REPORTED IMMEDIATELY: ?*FEVER GREATER THAN 100.4 F (38 ?C) OR HIGHER ?*CHILLS OR SWEATING ?*NAUSEA AND VOMITING THAT IS NOT CONTROLLED WITH YOUR NAUSEA MEDICATION ?*UNUSUAL SHORTNESS OF BREATH ?*UNUSUAL BRUISING OR BLEEDING ?*URINARY PROBLEMS (pain or burning when urinating, or frequent urination) ?*BOWEL PROBLEMS (unusual diarrhea, constipation, pain near the anus) ?TENDERNESS IN MOUTH AND THROAT WITH OR WITHOUT PRESENCE OF ULCERS (sore throat, sores in mouth, or a toothache) ?UNUSUAL RASH, SWELLING OR PAIN  ?UNUSUAL VAGINAL DISCHARGE OR ITCHING  ? ?Items with * indicate a potential emergency and should be followed up as soon as possible or go to the Emergency Department if any problems should occur. ? ?Please show the CHEMOTHERAPY ALERT CARD or IMMUNOTHERAPY ALERT CARD at check-in to  the Emergency Department and triage nurse. ? ?Should you have questions after your visit or need to cancel or reschedule your appointment, please contact [facility]  Dept: [phone]  and follow the prompts.  Office hours are 8:00 a.m. to 4:30 p.m. Monday - Friday. Please note that voicemails left after 4:00 p.m. may not be returned until the following business day.  We are closed weekends and major holidays. You have access to a nurse at all times for urgent questions. Please call the main number to the clinic Dept: [phone] and follow the prompts. ? ? ?For any non-urgent questions, you may also contact your provider using MyChart. We now offer e-Visits for anyone 18 and older to request care online for non-urgent symptoms. For details visit [email or web address]. ?  ?Also download the MyChart app! Go to the app store, search "MyChart", open the app, select , and log in with your MyChart username and password. ? ?Due to Covid, a mask is required upon entering the hospital/clinic. If you do not have a mask, one will be given to you upon arrival. For doctor visits, patients may have 1 support Marielle Mantione aged 18 or older with them. For treatment visits, patients cannot have anyone with them due to current Covid guidelines and our immunocompromised population.  ? ?

## 2021-12-10 NOTE — Assessment & Plan Note (Signed)
April Rosales is an 81 year old woman with metastatic breast cancer currently on treatment with Enhertu.  Her most recent restaging occurred on 10/18/2021.  She has no clinical signs of breast cancer progression.  We reviewed the recommendation to restage after her 8/18 dose (which would make 4 doses of Enhertu from 5/26 to 8/18).  I ordered CT chest to evaluate for asymptomatic pneumonitis per Enhertu prescribing recommendations and MRI liver w/wo contrast to evaluate the state of her hepatic metastasis.    April Rosales verbalizes understanding of this plan and is in agreement with it.  I continued to recommend small frequent meals.    She will return in 3 weeks for labs, f/u, and her next treatment.

## 2021-12-10 NOTE — Progress Notes (Signed)
April Rosales, April Rosales   DIAGNOSIS:  Cancer Staging  Breast cancer metastasized to multiple sites [facility]) Staging form: Breast, AJCC 7th Edition - Clinical: Stage IV ([place], [place], M1) - Signed by Chauncey Cruel, MD on 08/31/2014  Malignant neoplasm of upper-outer quadrant of left breast in female, estrogen receptor positive ([place]) Staging form: Breast, AJCC 7th Edition - Clinical: Stage IV ([place], NX, M1) - Signed by Chauncey Cruel, MD on 09/11/2015   SUMMARY OF ONCOLOGIC HISTORY: Oncology History  Breast cancer metastasized to multiple sites [facility])  08/28/2014 Initial Diagnosis   Breast cancer metastasized to multiple sites [facility])   Metastasis to bone ([place])  10/20/2014 Initial Diagnosis   Metastasis to bone ([place])   10/18/2021 Imaging   IMPRESSION: 1. Widespread metastatic disease to the liver appears generally stable compared to the prior study, with exception of a new lesion in segment 3 of the liver which measures 1.4 x 1.0 cm, as detailed above. 2. Cholelithiasis without evidence of acute cholecystitis at this time.     Malignant neoplasm of upper-outer quadrant of left breast in female, estrogen receptor positive ([place])  09/02/1983 Surgery   status post right lumpectomy and axillary node dissection in 1985 followed by radiation at [facility], exact date not clear.   08/16/2014 Imaging   METASTATIC DISEASE 08/16/2014: measurable disease in spine, lung and left breast   (2) evaluation for left shoulder pain led to thoracic spine MRI 08/16/2014 showing a pathologic fracture at T3 with epidural tumor displacing the cord to the right, but no cord compression. CT scans of the chest, abdomen and pelvis 08/24/2014 showed in addition a mass in the upper outer quadrant left breast measuring 1.6 cm and a nodule in the minor fissure of the right lung measuring 1.2 cm, but no parenchymal lung or liver  lesions.   (a) CA 27-29 was noninformative at 38 (09/20/2014)   08/29/2014 Mammogram   mammography and ultrasonography 08/29/2014 show a mass in the upper inner left breast which was palpable,  measuring 2.0 cm by ultrasound. Biopsy of this mass 08/29/2014 showed an invasive breast cancer with both lobular and ductal features, estrogen receptor positive, progesterone receptor weakly positive, with an MIB-1 in the 40% range, HER-2 equivocal (6 else ratio 1.5, but average number her nucleus 5.8    08/31/2014 - 05/29/2017 Anti-estrogen oral therapy   letrozole started 08/31/2014;   (a) palbociclib added Sept 2016 at 75 mg 21/7, with significant neutropenia; not repeated after first cycle  (b) letrozole discontinued 05/29/2017 with evidence of disease progression in the breast   10/20/2014 Surgery   10/20/2014 the patient underwent T2-T3 and T4 decompressive laminectomy with removal of epidural tumor, C7-T4 segmental pedicle screw instrumentation with virage screw system with arrow guidance protocol and C7-T4 posterolateral fusion. The cells were positive for the estrogen receptor. HER-2/neu testing by [facility] showed again equivocal results,   11/23/2014 - 12/11/2014 Radiation Therapy   11/23/2014-12/11/2014.  (a) T1-T5 was treated to 30 Gy in 12 fractions at 2.5 Gy per fraction    09/11/2015 Initial Diagnosis   Malignant neoplasm of upper-outer quadrant of left breast in female, estrogen receptor positive ([place])   05/29/2017 - 06/21/2018 Anti-estrogen oral therapy   fulvestrant started 05/29/2017, last dose 07/22/2018 (discontinued due to pandemic).  (a) started palbociclib 06/29/2017 at 75 mg every other day for 21 days on, 7 days off  (b) palbociclib discontinued on  3/29 due to progressive fatigue and patient preference   05/04/2018 Surgery   status post left lumpectomy 05/03/2018 showing a pT1b pNX invasive ductal carcinoma, grade 2, with equivocal HER-2 results  (a) opted against adjuvant radiation given  presence of stage IV disease   11/02/2018 PET scan   PET scan 11/02/2018 showed no active disease  (a) [place] scan on 10/31/2019 shows activity in 2 liver lesions, no active bone or lung lesions  (b) abdominal ultrasound 11/08/2019 confirms 2 liver lesions measuring 4.4 and 2.1 cm.  (c) biopsy of 1 of the liver lesions 12/12/2019 confirmed metastatic breast cancer, estrogen and progesterone receptor positive, with an MIB-1 of 10%, and HER-2 positive, with a copy number of 2.12 and the number per cell 6.25  (d) abdominal ultrasound 03/12/2020 shows one of the liver lesions to have increased, while the second lesion has decreased   01/25/2019 - 12/02/2019 Anti-estrogen oral therapy    letrozole started 02/15/2019, discontinued 12/02/2019 with evidence of progression  (a) bone density 09/12/2016 showed a T score of -4.8.    12/08/2019 - 01/24/2021 Anti-estrogen oral therapy   fulvestrant resumed 12/08/2019  (a) palbociclib added at 75 mg daily 21/7 starting 12/20/2019   (b) fulvestrant and palbociclib discontinued September 2022 with evidence of progression.   04/25/2020 -  Antibody Plan   trastuzumab started 04/25/2020, repeat every 28 days  (a) echo 03/12/2020 shows an ejection fraction in the 60-65% range  (b) changed to subQ formulation of trastuzumab 06/20/2020  (c) trastuzumab discontinued September 2022 with MRI of the abdomen showing evidence of progression    Genetic Testing   genetics testing using the Breast/Ovarian Cancer Panel through [facility] [name], MD) found no deleterious mutations in ATM, BARD1, BRCA1, BRCA2, BRIP1, CDH1, CHEK2, EPCAM, FANCC, MLH1, MSH2, MSH6, NBN, PALB2, PMS2, PTEN, RAD51C, RAD51D, STK11, TP53, or XRCC2      02/07/2021 -  Chemotherapy   Patient is on Treatment Plan :  BREAST METASTATIC fam-trastuzumab deruxtecan-nxki (Enhertu) q21d    starting Enhertu on 02/07/2021  (A) echo 11/21/2020 showed an ejection fraction in the 60-65% range  (B) Enhertu changed  to every 4 weeks after the second dose to allow for additional recovery time  (C) treatment held after the third dose with significant drop in the patient's functional status  (D) restaging abdominal MRI 05/14/2021    04/25/2021 Imaging    acute DVT involving the right femoral vein and other proximal veins.  The left side was benign  (A) rivaroxaban started 04/25/2021, April Rosales stopped it 07/03/2021   07/17/2021 Imaging   MRI abdomen done 07/17/2021 showed multifocal hepatic metastatic disease with similar distribution when compared to prior study using diffusion-weighted images lesions are quite similar but with perhaps slight interval decrease size and some of the smaller lesions, no gross areas of new disease in the liver   10/18/2021 Imaging   IMPRESSION: 1. Widespread metastatic disease to the liver appears generally stable compared to the prior study, with exception of a new lesion in segment 3 of the liver which measures 1.4 x 1.0 cm, as detailed above. 2. Cholelithiasis without evidence of acute cholecystitis at this time.     11/14/2021 Imaging   We tried to biopsy the lesion that appears worse on MRI, [place] however didn't show any metastatic lesion in the left lobe of the liver. Probable improvement in metastatic disease within the right lobe of the liver based on ultrasound measurements compared to the prior MRI last month. The largest central lesion shows  clear decrease in size.     CURRENT THERAPY: Enhertu  INTERVAL HISTORY: April Rosales 81 y.o. female returns for follow-up of her metastatic breast cancer while receiving Enhertu.  April Rosales is tolerating treatment moderately well.  Her main side effect is general fatigue the week following the infusion.  April Rosales otherwise is doing well and denies any significant issues.  April Rosales does eat small frequent meals because April Rosales has increased abdominal fullness from her liver.     Patient Active Problem List   Diagnosis Date Noted   Acute deep vein  thrombosis (DVT) of right femoral vein (HCC) 09/18/2021   Thoracic compression fracture ([place]) 05/15/2020   Closed compression fracture of body of L1 vertebra ([place]) 10/04/2019   Aortic atherosclerosis ([place]) 09/26/2019   Neuropathy 03/27/2016   Malignant neoplasm of upper-outer quadrant of left breast in female, estrogen receptor positive ([place]) 09/11/2015   BMI 22.0-22.9, adult 03/14/2015   Metastasis to bone ([place]) 10/20/2014   Breast cancer metastasized to multiple sites ([place]) 08/28/2014   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  MEDICAL HISTORY: Past Medical History:  Diagnosis Date   Arthritis    Breast cancer ([place]) 1985/2016   takes [name] daily   Chronic back pain    stenosis/listhesis   Family history of ovarian cancer    Osteoporosis    takes Vit D   Radiation 11/23/14-12/11/14   30 Gy T1-T5     SURGICAL HISTORY: Past Surgical History:  Procedure Laterality Date   ABDOMINAL HYSTERECTOMY     1980   APPENDECTOMY  4920   APPLICATION OF INTRAOPERATIVE CT SCAN N/A 10/20/2014   Procedure: APPLICATION OF INTRAOPERATIVE CAT SCAN;  Surgeon: [name], MD;  Location: [facility];  Service: Neurosurgery;  Laterality: N/A;   BREAST LUMPECTOMY WITH RADIOACTIVE SEED LOCALIZATION Left 05/03/2018   Procedure: LEFT BREAST LUMPECTOMY WITH BRACKETED RADIOACTIVE SEED LOCALIZATION;  Surgeon: [name], MD;  Location: [facility];  Service: General;  Laterality: Left;   BREAST SURGERY Right 1985   THYROID CYST EXCISION  1967    SOCIAL HISTORY: Social History   Socioeconomic History   Marital status:  Divorced    Spouse name: Not on file   Number of children: 1   Years of education: Not on file   Highest education level: Not on file  Occupational History   Not on file  Tobacco Use   Smoking status: Former    Packs/day: 0.50    Years: 10.00    Total pack years: 5.00    Types: Cigarettes    Quit date: 05/03/1970    Years since quitting: 51.6   Smokeless tobacco: Former   Tobacco comments:    quit smoking 1970's  Substance and Sexual Activity   Alcohol use: Yes    Alcohol/week: 0.0 standard drinks of alcohol    Comment: Rare   Drug use: No   Sexual activity: Not on file  Other Topics Concern   Not on file  Social History Narrative   Not on file   Social Determinants of Health   Financial Resource Strain: Not on file  Food Insecurity: Not on  file  Transportation Needs: Not on file  Physical Activity: Not on file  Stress: Not on file  Social Connections: Not on file  Intimate Partner Violence: Not on file    FAMILY HISTORY: Family History  Problem Relation Age of Onset   Heart disease Mother    Hypertension Mother    Heart disease Father    Diabetes Father    Breast cancer Paternal Aunt        4 paternal aunts with breast cancer over 35   Prostate cancer Paternal Uncle    Stroke Paternal Grandfather    Ovarian cancer Paternal Aunt    Huntington's disease Other        Nephew, inherited from his father    Review of Systems  Constitutional:  Negative for appetite change, chills, fatigue, fever and unexpected weight change.  HENT:   Negative for hearing loss, lump/mass and trouble swallowing.   Eyes:  Negative for eye problems and icterus.  Respiratory:  Negative for chest tightness, cough and shortness of breath.   Cardiovascular:  Negative for chest pain, leg swelling and palpitations.  Gastrointestinal:  Negative for abdominal distention, abdominal pain, constipation, diarrhea, nausea and vomiting.  Endocrine: Negative for hot flashes.  Genitourinary:   Negative for difficulty urinating.   Musculoskeletal:  Negative for arthralgias.  Skin:  Negative for itching and rash.  Neurological:  Negative for dizziness, extremity weakness, headaches and numbness.  Hematological:  Negative for adenopathy. Does not bruise/bleed easily.  Psychiatric/Behavioral:  Negative for depression. The patient is not nervous/anxious.       PHYSICAL EXAMINATION  ECOG PERFORMANCE STATUS: 1 - Symptomatic but completely ambulatory  Vitals:   12/10/21 1203  BP: (!) 153/81  Pulse: 100  Resp: 18  Temp: 98.4 F (36.9 C)  SpO2: 97%    Physical Exam Constitutional:      General: April Rosales is not in acute distress.    Appearance: Normal appearance. April Rosales is not toxic-appearing.  HENT:     Head: Normocephalic and atraumatic.  Eyes:     General: No scleral icterus. Cardiovascular:     Rate and Rhythm: Normal rate and regular rhythm.     Pulses: Normal pulses.     Heart sounds: Normal heart sounds.  Pulmonary:     Effort: Pulmonary effort is normal.     Breath sounds: Normal breath sounds.  Abdominal:     General: Abdomen is flat. Bowel sounds are normal. There is no distension.     Palpations: Abdomen is soft.     Tenderness: There is no abdominal tenderness.  Musculoskeletal:        General: No swelling.     Cervical back: Neck supple.  Lymphadenopathy:     Cervical: No cervical adenopathy.  Skin:    General: Skin is warm and dry.     Findings: No rash.  Neurological:     General: No focal deficit present.     Mental Status: April Rosales is alert.  Psychiatric:        Mood and Affect: Mood normal.        Behavior: Behavior normal.     LABORATORY DATA:  CBC    Component Value Date/Time   WBC 5.2 12/10/2021 1137   WBC 4.5 11/14/2021 1150   RBC 4.08 12/10/2021 1137   HGB 13.7 12/10/2021 1137   HGB 15.1 05/29/2017 1417   HCT 40.0 12/10/2021 1137   HCT 45.1 05/29/2017 1417   PLT 282 12/10/2021 1137   PLT 262 05/29/2017  1417   MCV 98.0 12/10/2021 1137    MCV 91.7 05/29/2017 1417   MCH 33.6 12/10/2021 1137   MCHC 34.3 12/10/2021 1137   RDW 15.0 12/10/2021 1137   RDW 14.1 05/29/2017 1417   LYMPHSABS 1.0 12/10/2021 1137   LYMPHSABS 1.1 05/29/2017 1417   MONOABS 0.6 12/10/2021 1137   MONOABS 0.4 05/29/2017 1417   EOSABS 0.2 12/10/2021 1137   EOSABS 0.1 05/29/2017 1417   BASOSABS 0.1 12/10/2021 1137   BASOSABS 0.1 05/29/2017 1417    CMP     Component Value Date/Time   NA 140 12/10/2021 1137   NA 141 05/29/2017 1417   K 3.7 12/10/2021 1137   K 3.6 05/29/2017 1417   CL 109 12/10/2021 1137   CO2 27 12/10/2021 1137   CO2 27 05/29/2017 1417   GLUCOSE 101 (H) 12/10/2021 1137   GLUCOSE 111 05/29/2017 1417   BUN 8 12/10/2021 1137   BUN 17.0 05/29/2017 1417   CREATININE 0.50 12/10/2021 1137   CREATININE 0.66 03/05/2020 1218   CREATININE 0.8 05/29/2017 1417   CALCIUM 9.5 12/10/2021 1137   CALCIUM 10.7 (H) 06/26/2017 1216   CALCIUM 10.4 05/29/2017 1417   PROT 6.2 (L) 12/10/2021 1137   PROT 7.3 05/29/2017 1417   ALBUMIN 3.6 12/10/2021 1137   ALBUMIN 3.9 05/29/2017 1417   AST 25 12/10/2021 1137   AST 17 05/29/2017 1417   ALT 13 12/10/2021 1137   ALT 15 05/29/2017 1417   ALKPHOS 37 (L) 12/10/2021 1137   ALKPHOS 42 05/29/2017 1417   BILITOT 0.5 12/10/2021 1137   BILITOT 0.30 05/29/2017 1417   GFRNONAA >60 12/10/2021 1137   GFRNONAA 83 03/05/2020 1218   GFRAA 97 03/05/2020 1218         ASSESSMENT and THERAPY PLAN:   Malignant neoplasm of upper-outer quadrant of left breast in female, estrogen receptor positive ([place]) Abiola is an 81 year old woman with metastatic breast cancer currently on treatment with Enhertu.  Her most recent restaging occurred on 10/18/2021.  April Rosales has no clinical signs of breast cancer progression.  We reviewed the recommendation to restage after her 8/18 dose (which would make 4 doses of Enhertu from 5/26 to 8/18).  I ordered CT chest to evaluate for asymptomatic pneumonitis per Enhertu prescribing  recommendations and MRI liver w/wo contrast to evaluate the state of her hepatic metastasis.    Idali verbalizes understanding of this plan and is in agreement with it.  I continued to recommend small frequent meals.    April Rosales will return in 3 weeks for labs, f/u, and her next treatment.     All questions were answered. The patient knows to call the clinic with any problems, questions or concerns. We can certainly see the patient much sooner if necessary.  Total encounter time:20 minutes*in face-to-face visit time, chart review, lab review, care coordination, order entry, and documentation of the encounter time.    Wilber Bihari, NP 12/10/21 3:46 PM Medical Oncology and Hematology [facility] [place], [place] [address] Tel. [phone]    Fax. [phone]  *Total Encounter Time as defined by the Centers for Medicare and Medicaid Services includes, in addition to the face-to-face time of a patient visit (documented in the note above) non-face-to-face time: obtaining and reviewing outside history, ordering and reviewing medications, tests or procedures, care coordination (communications with other health care professionals or caregivers) and documentation in the medical record.

## 2021-12-11 ENCOUNTER — Inpatient Hospital Stay: Payer: Medicare Other

## 2021-12-11 ENCOUNTER — Inpatient Hospital Stay: Payer: Medicare Other | Admitting: Hematology and Oncology

## 2021-12-16 ENCOUNTER — Other Ambulatory Visit: Payer: Self-pay

## 2021-12-23 ENCOUNTER — Telehealth: Payer: Self-pay | Admitting: *Deleted

## 2021-12-23 NOTE — Telephone Encounter (Signed)
This RN spoke with the pt per her call stating since having recent cycle of chemo " I am just not recovering like I normally do and am wondering if we need to push back my next appt a week "  Seanne states she has weakness starting approximately 3 days post chemo that last normally 4 days she then recovers and is able to resume her ADLS " especially the last week prior to my next therapy is my best week "   Pt is on week her last week and is not recovering as usual.  She states symptoms are not worse but lingering longer then normal- now going on 7 days.  She is able to maintain her self care and eat and drink well- she states she drinks about 50 ounces a day.  She denies any Short of Breath, does not have a cough, nor is there any pain.  " Just feeling weak "  Plan per discussion- is pt will monitor her symptoms- she understands to call if symptoms worsen or she has other changes.  Pt and nurse will communicate again on Wednesday for  update on her status.

## 2021-12-26 ENCOUNTER — Telehealth: Payer: Self-pay | Admitting: *Deleted

## 2021-12-26 NOTE — Telephone Encounter (Signed)
This RN spoke with pt per follow up on her call from Monday 7-31 stating ongoing "weakness" and concern to receiving treatment scheduled next week.  Presently weakness (general) has mildly improved and April Rosales would prefer to push her treatment out until the week of 8/14.  She states intake of fluid ( at least 50 ounces daily ) and good food intake. She can manage her personal care. She denies any shortness of breath - and no cough.  This note will be forwarded to MD for review- for rescheduling of appts on 12/31/2021

## 2021-12-27 ENCOUNTER — Telehealth: Payer: Self-pay | Admitting: Hematology and Oncology

## 2021-12-27 NOTE — Telephone Encounter (Signed)
Scheduled per 8/4 in basket, pt has been called and confirmed

## 2021-12-28 ENCOUNTER — Other Ambulatory Visit: Payer: Self-pay

## 2021-12-31 ENCOUNTER — Ambulatory Visit: Payer: Medicare Other

## 2021-12-31 ENCOUNTER — Ambulatory Visit: Payer: Medicare Other | Admitting: Hematology and Oncology

## 2021-12-31 ENCOUNTER — Other Ambulatory Visit: Payer: Medicare Other

## 2022-01-01 ENCOUNTER — Other Ambulatory Visit: Payer: Self-pay

## 2022-01-03 ENCOUNTER — Other Ambulatory Visit: Payer: Self-pay

## 2022-01-05 ENCOUNTER — Ambulatory Visit (HOSPITAL_COMMUNITY): Payer: Medicare Other

## 2022-01-08 ENCOUNTER — Encounter: Payer: Self-pay | Admitting: Hematology and Oncology

## 2022-01-08 ENCOUNTER — Inpatient Hospital Stay: Payer: Medicare Other

## 2022-01-08 ENCOUNTER — Inpatient Hospital Stay: Payer: Medicare Other | Attending: Oncology

## 2022-01-08 ENCOUNTER — Other Ambulatory Visit: Payer: Self-pay

## 2022-01-08 ENCOUNTER — Inpatient Hospital Stay (HOSPITAL_BASED_OUTPATIENT_CLINIC_OR_DEPARTMENT_OTHER): Payer: Medicare Other | Admitting: Hematology and Oncology

## 2022-01-08 DIAGNOSIS — K7689 Other specified diseases of liver: Secondary | ICD-10-CM

## 2022-01-08 DIAGNOSIS — C7951 Secondary malignant neoplasm of bone: Secondary | ICD-10-CM | POA: Insufficient documentation

## 2022-01-08 DIAGNOSIS — N289 Disorder of kidney and ureter, unspecified: Secondary | ICD-10-CM | POA: Diagnosis not present

## 2022-01-08 DIAGNOSIS — C787 Secondary malignant neoplasm of liver and intrahepatic bile duct: Secondary | ICD-10-CM | POA: Insufficient documentation

## 2022-01-08 DIAGNOSIS — R5383 Other fatigue: Secondary | ICD-10-CM | POA: Insufficient documentation

## 2022-01-08 DIAGNOSIS — C50412 Malignant neoplasm of upper-outer quadrant of left female breast: Secondary | ICD-10-CM | POA: Insufficient documentation

## 2022-01-08 DIAGNOSIS — I10 Essential (primary) hypertension: Secondary | ICD-10-CM | POA: Diagnosis not present

## 2022-01-08 DIAGNOSIS — Z87891 Personal history of nicotine dependence: Secondary | ICD-10-CM | POA: Insufficient documentation

## 2022-01-08 DIAGNOSIS — C50919 Malignant neoplasm of unspecified site of unspecified female breast: Secondary | ICD-10-CM | POA: Diagnosis not present

## 2022-01-08 LAB — CBC WITH DIFFERENTIAL (CANCER CENTER ONLY)
Abs Immature Granulocytes: 0.01 10*3/uL (ref 0.00–0.07)
Basophils Absolute: 0.1 10*3/uL (ref 0.0–0.1)
Basophils Relative: 1 %
Eosinophils Absolute: 0.3 10*3/uL (ref 0.0–0.5)
Eosinophils Relative: 5 %
HCT: 39.3 % (ref 36.0–46.0)
Hemoglobin: 13.9 g/dL (ref 12.0–15.0)
Immature Granulocytes: 0 %
Lymphocytes Relative: 23 %
Lymphs Abs: 1.3 10*3/uL (ref 0.7–4.0)
MCH: 33.7 pg (ref 26.0–34.0)
MCHC: 35.4 g/dL (ref 30.0–36.0)
MCV: 95.2 fL (ref 80.0–100.0)
Monocytes Absolute: 0.7 10*3/uL (ref 0.1–1.0)
Monocytes Relative: 12 %
Neutro Abs: 3.4 10*3/uL (ref 1.7–7.7)
Neutrophils Relative %: 59 %
Platelet Count: 258 10*3/uL (ref 150–400)
RBC: 4.13 MIL/uL (ref 3.87–5.11)
RDW: 14.5 % (ref 11.5–15.5)
WBC Count: 5.8 10*3/uL (ref 4.0–10.5)
nRBC: 0 % (ref 0.0–0.2)

## 2022-01-08 LAB — CMP (CANCER CENTER ONLY)
ALT: 13 U/L (ref 0–44)
AST: 24 U/L (ref 15–41)
Albumin: 3.4 g/dL — ABNORMAL LOW (ref 3.5–5.0)
Alkaline Phosphatase: 38 U/L (ref 38–126)
Anion gap: 6 (ref 5–15)
BUN: 6 mg/dL — ABNORMAL LOW (ref 8–23)
CO2: 28 mmol/L (ref 22–32)
Calcium: 10 mg/dL (ref 8.9–10.3)
Chloride: 108 mmol/L (ref 98–111)
Creatinine: 0.55 mg/dL (ref 0.44–1.00)
GFR, Estimated: >60 mL/min (ref >60)
Glucose, Bld: 101 mg/dL — ABNORMAL HIGH (ref 70–99)
Potassium: 3.4 mmol/L — ABNORMAL LOW (ref 3.5–5.1)
Sodium: 142 mmol/L (ref 135–145)
Total Bilirubin: 0.4 mg/dL (ref 0.3–1.2)
Total Protein: 6.3 g/dL — ABNORMAL LOW (ref 6.5–8.1)

## 2022-01-08 NOTE — Progress Notes (Signed)
April Rosales Follow up:    April Rosales, [place] [address]   DIAGNOSIS:  Rosales Staging  Breast Rosales metastasized to multiple sites [facility]) Staging form: Breast, AJCC 7th Edition - Clinical: Stage IV ([place], [place], M1) - Signed by Chauncey Cruel, MD on 08/31/2014  Malignant neoplasm of upper-outer quadrant of left breast in female, estrogen receptor positive ([place]) Staging form: Breast, AJCC 7th Edition - Clinical: Stage IV ([place], NX, M1) - Signed by Chauncey Cruel, MD on 09/11/2015   SUMMARY OF ONCOLOGIC HISTORY: Oncology History  Breast Rosales metastasized to multiple sites [facility])  08/28/2014 Initial Diagnosis   Breast Rosales metastasized to multiple sites [facility])   Metastasis to bone ([place])  10/20/2014 Initial Diagnosis   Metastasis to bone ([place])   10/18/2021 Imaging   IMPRESSION: 1. Widespread metastatic disease to the liver appears generally stable compared to the prior study, with exception of a new lesion in segment 3 of the liver which measures 1.4 x 1.0 cm, as detailed above. 2. Cholelithiasis without evidence of acute cholecystitis at this time.     Malignant neoplasm of upper-outer quadrant of left breast in female, estrogen receptor positive ([place])  09/02/1983 Surgery   status post right lumpectomy and axillary node dissection in 1985 followed by radiation at [facility], exact date not clear.   08/16/2014 Imaging   METASTATIC DISEASE 08/16/2014: measurable disease in spine, lung and left breast   (2) evaluation for left shoulder pain led to thoracic spine MRI 08/16/2014 showing a pathologic fracture at T3 with epidural tumor displacing the cord to the right, but no cord compression. CT scans of the chest, abdomen and pelvis 08/24/2014 showed in addition a mass in the upper outer quadrant left breast measuring 1.6 cm and a nodule in the minor fissure of the right lung measuring 1.2 cm, but no parenchymal lung or liver  lesions.   (a) CA 27-29 was noninformative at 38 (09/20/2014)   08/29/2014 Mammogram   mammography and ultrasonography 08/29/2014 show a mass in the upper inner left breast which was palpable,  measuring 2.0 cm by ultrasound. Biopsy of this mass 08/29/2014 showed an invasive breast Rosales with both lobular and ductal features, estrogen receptor positive, progesterone receptor weakly positive, with an MIB-1 in the 40% range, HER-2 equivocal (6 else ratio 1.5, but average number her nucleus 5.8    08/31/2014 - 05/29/2017 Anti-estrogen oral therapy   letrozole started 08/31/2014;   (a) palbociclib added Sept 2016 at 75 mg 21/7, with significant neutropenia; not repeated after first cycle  (b) letrozole discontinued 05/29/2017 with evidence of disease progression in the breast   10/20/2014 Surgery   10/20/2014 the patient underwent T2-T3 and T4 decompressive laminectomy with removal of epidural tumor, C7-T4 segmental pedicle screw instrumentation with virage screw system with arrow guidance protocol and C7-T4 posterolateral fusion. The cells were positive for the estrogen receptor. HER-2/neu testing by [facility] showed again equivocal results,   11/23/2014 - 12/11/2014 Radiation Therapy   11/23/2014-12/11/2014.  (a) T1-T5 was treated to 30 Gy in 12 fractions at 2.5 Gy per fraction    09/11/2015 Initial Diagnosis   Malignant neoplasm of upper-outer quadrant of left breast in female, estrogen receptor positive ([place])   05/29/2017 - 06/21/2018 Anti-estrogen oral therapy   fulvestrant started 05/29/2017, last dose 07/22/2018 (discontinued due to pandemic).  (a) started palbociclib 06/29/2017 at 75 mg every other day for 21 days on, 7 days off  (b) palbociclib discontinued on  3/29 due to progressive fatigue and patient preference   05/04/2018 Surgery   status post left lumpectomy 05/03/2018 showing a pT1b pNX invasive ductal carcinoma, grade 2, with equivocal HER-2 results  (a) opted against adjuvant radiation given  presence of stage IV disease   11/02/2018 PET scan   PET scan 11/02/2018 showed no active disease  (a) [place] scan on 10/31/2019 shows activity in 2 liver lesions, no active bone or lung lesions  (b) abdominal ultrasound 11/08/2019 confirms 2 liver lesions measuring 4.4 and 2.1 cm.  (c) biopsy of 1 of the liver lesions 12/12/2019 confirmed metastatic breast Rosales, estrogen and progesterone receptor positive, with an MIB-1 of 10%, and HER-2 positive, with a copy number of 2.12 and the number per cell 6.25  (d) abdominal ultrasound 03/12/2020 shows one of the liver lesions to have increased, while the second lesion has decreased   01/25/2019 - 12/02/2019 Anti-estrogen oral therapy    letrozole started 02/15/2019, discontinued 12/02/2019 with evidence of progression  (a) bone density 09/12/2016 showed a T score of -4.8.    12/08/2019 - 01/24/2021 Anti-estrogen oral therapy   fulvestrant resumed 12/08/2019  (a) palbociclib added at 75 mg daily 21/7 starting 12/20/2019   (b) fulvestrant and palbociclib discontinued September 2022 with evidence of progression.   04/25/2020 -  Antibody Plan   trastuzumab started 04/25/2020, repeat every 28 days  (a) echo 03/12/2020 shows an ejection fraction in the 60-65% range  (b) changed to subQ formulation of trastuzumab 06/20/2020  (c) trastuzumab discontinued September 2022 with MRI of the abdomen showing evidence of progression    Genetic Testing   genetics testing using the Breast/Ovarian Rosales Panel through [facility] April Pigeon, MD) found no deleterious mutations in ATM, BARD1, BRCA1, BRCA2, BRIP1, CDH1, CHEK2, EPCAM, FANCC, MLH1, MSH2, MSH6, NBN, PALB2, PMS2, PTEN, RAD51C, RAD51D, STK11, TP53, or XRCC2      02/07/2021 -  Chemotherapy   Patient is on Treatment Plan :  BREAST METASTATIC fam-trastuzumab deruxtecan-nxki (Enhertu) q21d    starting Enhertu on 02/07/2021  (A) echo 11/21/2020 showed an ejection fraction in the 60-65% range  (B) Enhertu changed  to every 4 weeks after the second dose to allow for additional recovery time  (C) treatment held after the third dose with significant drop in the patient's functional status  (D) restaging abdominal MRI 05/14/2021    04/25/2021 Imaging    acute DVT involving the right femoral vein and other proximal veins.  The left side was benign  (A) rivaroxaban started 04/25/2021, she stopped it 07/03/2021   07/17/2021 Imaging   MRI abdomen done 07/17/2021 showed multifocal hepatic metastatic disease with similar distribution when compared to prior study using diffusion-weighted images lesions are quite similar but with perhaps slight interval decrease size and some of the smaller lesions, no gross areas of new disease in the liver   10/18/2021 Imaging   IMPRESSION: 1. Widespread metastatic disease to the liver appears generally stable compared to the prior study, with exception of a new lesion in segment 3 of the liver which measures 1.4 x 1.0 cm, as detailed above. 2. Cholelithiasis without evidence of acute cholecystitis at this time.     11/14/2021 Imaging   We tried to biopsy the lesion that appears worse on MRI, [place] however didn't show any metastatic lesion in the left lobe of the liver. Probable improvement in metastatic disease within the right lobe of the liver based on ultrasound measurements compared to the prior MRI last month. The largest central lesion shows  clear decrease in size.     CURRENT THERAPY: Enhertu  INTERVAL HISTORY:  April Rosales 81 y.o. female returns for follow-up of her metastatic breast Rosales while receiving Enhertu.  Recently we received a phone call saying that she wanted to delay her Enhertu because she was getting very tired. Her last Enhertu was on July 18. She says she still feels very tired. She doesn't feel like she is ready to take the treatment yet. She also notices that HTN is a bit uncontrolled today, not sure what its at home No change in  breathing. No change in bowel habits or urinary habits.  Patient Active Problem List   Diagnosis Date Noted   Acute deep vein thrombosis (DVT) of right femoral vein (HCC) 09/18/2021   Thoracic compression fracture ([place]) 05/15/2020   Closed compression fracture of body of L1 vertebra ([place]) 10/04/2019   Aortic atherosclerosis ([place]) 09/26/2019   Neuropathy 03/27/2016   Malignant neoplasm of upper-outer quadrant of left breast in female, estrogen receptor positive ([place]) 09/11/2015   BMI 22.0-22.9, adult 03/14/2015   Metastasis to bone ([place]) 10/20/2014   Breast Rosales metastasized to multiple sites ([place]) 08/28/2014   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  MEDICAL HISTORY: Past Medical History:  Diagnosis Date   Arthritis    Breast Rosales ([place]) 1985/2016   takes [name] daily   Chronic back pain    stenosis/listhesis   Family history of ovarian Rosales    Osteoporosis    takes Vit D   Radiation 11/23/14-12/11/14   30 Gy T1-T5     SURGICAL HISTORY: Past Surgical History:  Procedure Laterality Date   ABDOMINAL HYSTERECTOMY     1980   APPENDECTOMY  5277   APPLICATION OF INTRAOPERATIVE CT SCAN N/A 10/20/2014   Procedure: APPLICATION OF INTRAOPERATIVE CAT SCAN;  Surgeon: [name], MD;  Location: [facility];  Service: Neurosurgery;  Laterality: N/A;   BREAST LUMPECTOMY WITH RADIOACTIVE SEED LOCALIZATION Left 05/03/2018   Procedure: LEFT BREAST LUMPECTOMY WITH BRACKETED RADIOACTIVE SEED LOCALIZATION;  Surgeon: [name], MD;  Location: [facility];  Service: General;  Laterality: Left;   BREAST  SURGERY Right 1985   THYROID CYST EXCISION  1967    SOCIAL HISTORY: Social History   Socioeconomic History   Marital status: Divorced    Spouse name: Not on file   Number of children: 1   Years of education: Not on file   Highest education level: Not on file  Occupational History   Not on file  Tobacco Use   Smoking status: Former    Packs/day: 0.50    Years: 10.00    Total pack years: 5.00    Types: Cigarettes    Quit date: 05/03/1970    Years since quitting: 51.7   Smokeless tobacco: Former   Tobacco comments:    quit smoking 1970's  Substance and Sexual Activity   Alcohol use: Yes    Alcohol/week: 0.0 standard drinks of alcohol    Comment: Rare   Drug use: No   Sexual activity: Not on file  Other Topics Concern   Not on file  Social  History Narrative   Not on file   Social Determinants of Health   Financial Resource Strain: Not on file  Food Insecurity: Not on file  Transportation Needs: Not on file  Physical Activity: Not on file  Stress: Not on file  Social Connections: Not on file  Intimate Partner Violence: Not on file    FAMILY HISTORY: Family History  Problem Relation Age of Onset   Heart disease Mother    Hypertension Mother    Heart disease Father    Diabetes Father    Breast Rosales Paternal Aunt        4 paternal aunts with breast Rosales over 10   Prostate Rosales Paternal Uncle    Stroke Paternal Grandfather    Ovarian Rosales Paternal Aunt    H[place]'s disease Other        Nephew, inherited from his father    Review of Systems  Constitutional:  Negative for appetite change, chills, fatigue, fever and unexpected weight change.  HENT:   Negative for hearing loss, lump/mass and trouble swallowing.   Eyes:  Negative for eye problems and icterus.  Respiratory:  Negative for chest tightness, cough and shortness of breath.   Cardiovascular:  Negative for chest pain, leg swelling and palpitations.  Gastrointestinal:  Negative for abdominal  distention, abdominal pain, constipation, diarrhea, nausea and vomiting.  Endocrine: Negative for hot flashes.  Genitourinary:  Negative for difficulty urinating.   Musculoskeletal:  Negative for arthralgias.  Skin:  Negative for itching and rash.  Neurological:  Negative for dizziness, extremity weakness, headaches and numbness.  Hematological:  Negative for adenopathy. Does not bruise/bleed easily.  Psychiatric/Behavioral:  Negative for depression. The patient is not nervous/anxious.       PHYSICAL EXAMINATION  ECOG PERFORMANCE STATUS: 1 - Symptomatic but completely ambulatory  Vitals:   01/08/22 1242  BP: (!) 160/91  Pulse: (!) 103  Resp: 18  Temp: 98.2 F (36.8 C)  SpO2: 94%    Physical Exam Constitutional:      General: She is not in acute distress.    Appearance: Normal appearance. She is not toxic-appearing.  HENT:     Head: Normocephalic and atraumatic.  Eyes:     General: No scleral icterus. Cardiovascular:     Rate and Rhythm: Normal rate and regular rhythm.     Pulses: Normal pulses.     Heart sounds: Normal heart sounds.  Pulmonary:     Effort: Pulmonary effort is normal.     Breath sounds: Normal breath sounds.  Abdominal:     General: Abdomen is flat. Bowel sounds are normal. There is no distension.     Palpations: Abdomen is soft.     Tenderness: There is no abdominal tenderness.  Musculoskeletal:        General: No swelling.     Cervical back: Neck supple.  Lymphadenopathy:     Cervical: No cervical adenopathy.  Skin:    General: Skin is warm and dry.     Findings: No rash.  Neurological:     General: No focal deficit present.     Mental Status: She is alert.  Psychiatric:        Mood and Affect: Mood normal.        Behavior: Behavior normal.     LABORATORY DATA:  CBC    Component Value Date/Time   WBC 5.8 01/08/2022 1219   WBC 4.5 11/14/2021 1150   RBC 4.13 01/08/2022 1219   HGB 13.9 01/08/2022 1219  HGB 15.1 05/29/2017 1417    HCT 39.3 01/08/2022 1219   HCT 45.1 05/29/2017 1417   PLT 258 01/08/2022 1219   PLT 262 05/29/2017 1417   MCV 95.2 01/08/2022 1219   MCV 91.7 05/29/2017 1417   MCH 33.7 01/08/2022 1219   MCHC 35.4 01/08/2022 1219   RDW 14.5 01/08/2022 1219   RDW 14.1 05/29/2017 1417   LYMPHSABS 1.3 01/08/2022 1219   LYMPHSABS 1.1 05/29/2017 1417   MONOABS 0.7 01/08/2022 1219   MONOABS 0.4 05/29/2017 1417   EOSABS 0.3 01/08/2022 1219   EOSABS 0.1 05/29/2017 1417   BASOSABS 0.1 01/08/2022 1219   BASOSABS 0.1 05/29/2017 1417    CMP     Component Value Date/Time   NA 140 12/10/2021 1137   NA 141 05/29/2017 1417   K 3.7 12/10/2021 1137   K 3.6 05/29/2017 1417   CL 109 12/10/2021 1137   CO2 27 12/10/2021 1137   CO2 27 05/29/2017 1417   GLUCOSE 101 (H) 12/10/2021 1137   GLUCOSE 111 05/29/2017 1417   BUN 8 12/10/2021 1137   BUN 17.0 05/29/2017 1417   CREATININE 0.50 12/10/2021 1137   CREATININE 0.66 03/05/2020 1218   CREATININE 0.8 05/29/2017 1417   CALCIUM 9.5 12/10/2021 1137   CALCIUM 10.7 (H) 06/26/2017 1216   CALCIUM 10.4 05/29/2017 1417   PROT 6.2 (L) 12/10/2021 1137   PROT 7.3 05/29/2017 1417   ALBUMIN 3.6 12/10/2021 1137   ALBUMIN 3.9 05/29/2017 1417   AST 25 12/10/2021 1137   AST 17 05/29/2017 1417   ALT 13 12/10/2021 1137   ALT 15 05/29/2017 1417   ALKPHOS 37 (L) 12/10/2021 1137   ALKPHOS 42 05/29/2017 1417   BILITOT 0.5 12/10/2021 1137   BILITOT 0.30 05/29/2017 1417   GFRNONAA >60 12/10/2021 1137   GFRNONAA 83 03/05/2020 1218   GFRAA 97 03/05/2020 1218     ASSESSMENT and THERAPY PLAN:   Breast Rosales metastasized to multiple sites 81 y.o. April Rosales with stage IV left breast Rosales involving liver, bone currently on Enhertu who is here for follow up. Please review history above. She continues to tolerate Enhertu very well except for some grade 1-2 fatigue and grade 1 constipation.  She had recent MRI abdomen to evaluate disease progression and this showed ongoing  widespread metastatic disease to the liver appears generally stable with the exception of one new lesion which measured 1.4 x 1 cm. We tried to biopsy this lesion and she underwent ultrasound which did not show any evidence of metastatic disease in the left lobe of the liver.  Actually according to the radiologist, she had good response in the liver so far and they recommend doing a CT abdomen with liver protocol in the future for monitoring. I changed the MR abdomen to CT imaging. RTC with me in 2 weeks to follow up on imaging and to consider restarting Enhertu likely at second dose reduction if she continues to have response.    All questions were answered. The patient knows to call the clinic with any problems, questions or concerns. We can certainly see the patient much sooner if necessary.  Total encounter time:30 minutes*in face-to-face visit time, chart review, lab review, care coordination, order entry, and documentation of the encounter time.   *Total Encounter Time as defined by the Centers for Medicare and Medicaid Services includes, in addition to the face-to-face time of a patient visit (documented in the note above) non-face-to-face time: obtaining and reviewing outside history, ordering and reviewing  medications, tests or procedures, care coordination (communications with other health care professionals or caregivers) and documentation in the medical record.  

## 2022-01-08 NOTE — Assessment & Plan Note (Addendum)
81 y.o. [place] woman with stage IV left breast cancer involving liver, bone currently on Enhertu who is here for follow up. Please review history above. She continues to tolerate Enhertu very well except for some grade 1-2 fatigue and grade 1 constipation.  She had recent MRI abdomen to evaluate disease progression and this showed ongoing widespread metastatic disease to the liver appears generally stable with the exception of one new lesion which measured 1.4 x 1 cm. We tried to biopsy this lesion and she underwent ultrasound which did not show any evidence of metastatic disease in the left lobe of the liver.  Actually according to the radiologist, she had good response in the liver so far and they recommend doing a CT abdomen with liver protocol in the future for monitoring. I changed the MR abdomen to CT imaging. RTC with me in 2 weeks to follow up on imaging and to consider restarting Enhertu likely at second dose reduction if she continues to have response.   

## 2022-01-09 ENCOUNTER — Other Ambulatory Visit: Payer: Self-pay

## 2022-01-13 ENCOUNTER — Other Ambulatory Visit: Payer: Self-pay | Admitting: Hematology and Oncology

## 2022-01-13 DIAGNOSIS — C50412 Malignant neoplasm of upper-outer quadrant of left female breast: Secondary | ICD-10-CM

## 2022-01-13 DIAGNOSIS — C7951 Secondary malignant neoplasm of bone: Secondary | ICD-10-CM

## 2022-01-13 DIAGNOSIS — C50919 Malignant neoplasm of unspecified site of unspecified female breast: Secondary | ICD-10-CM

## 2022-01-13 NOTE — Progress Notes (Signed)
New treatment plan placed per IS.

## 2022-01-13 NOTE — Progress Notes (Signed)
START OFF PATHWAY REGIMEN - Breast   [id number]:Fam-trastuzumab deruxtecan-nxki 5.4 mg/kg IV D1 q21 Days:   A cycle is every 21 days:     Fam-trastuzumab deruxtecan-nxki   **Always confirm dose/schedule in your pharmacy ordering system**  Patient Characteristics: Distant Metastases or Locoregional Recurrent Disease - Unresected or Locally Advanced Unresectable Disease Progressing after Neoadjuvant and Local Therapies, HER2 Positive, ER Positive, Chemotherapy + HER2-Targeted Therapy, Third Line Therapeutic Status: Distant Metastases HER2 Status: Positive (+) ER Status: Positive (+) PR Status: Positive (+) Line of Therapy: Third Line Intent of Therapy: Non-Curative / Palliative Intent, Discussed with Patient

## 2022-01-15 ENCOUNTER — Other Ambulatory Visit: Payer: Self-pay

## 2022-01-20 ENCOUNTER — Ambulatory Visit (HOSPITAL_COMMUNITY): Payer: Medicare Other

## 2022-01-23 ENCOUNTER — Ambulatory Visit (HOSPITAL_COMMUNITY)
Admission: RE | Admit: 2022-01-23 | Discharge: 2022-01-23 | Disposition: A | Discharge status: Home / Self Care | Payer: Medicare Other | Source: Ambulatory Visit | Attending: Adult Health | Admitting: Adult Health

## 2022-01-23 DIAGNOSIS — N289 Disorder of kidney and ureter, unspecified: Secondary | ICD-10-CM | POA: Diagnosis not present

## 2022-01-23 DIAGNOSIS — C50412 Malignant neoplasm of upper-outer quadrant of left female breast: Secondary | ICD-10-CM | POA: Insufficient documentation

## 2022-01-23 DIAGNOSIS — K7689 Other specified diseases of liver: Secondary | ICD-10-CM | POA: Insufficient documentation

## 2022-01-23 DIAGNOSIS — C50919 Malignant neoplasm of unspecified site of unspecified female breast: Secondary | ICD-10-CM | POA: Diagnosis not present

## 2022-01-23 DIAGNOSIS — C787 Secondary malignant neoplasm of liver and intrahepatic bile duct: Secondary | ICD-10-CM | POA: Diagnosis not present

## 2022-01-23 DIAGNOSIS — J479 Bronchiectasis, uncomplicated: Secondary | ICD-10-CM | POA: Diagnosis not present

## 2022-01-23 DIAGNOSIS — Z170 Estrogen receptor positive status [ER+]: Secondary | ICD-10-CM | POA: Insufficient documentation

## 2022-01-23 DIAGNOSIS — J9811 Atelectasis: Secondary | ICD-10-CM | POA: Diagnosis not present

## 2022-01-23 MED ORDER — IOHEXOL 300 MG/ML  SOLN
100.0000 mL | Freq: Once | INTRAMUSCULAR | Status: AC | PRN
  Administered 2022-01-23: 100 mL via INTRAVENOUS

## 2022-01-23 MED ORDER — SODIUM CHLORIDE (PF) 0.9 % IJ SOLN
INTRAMUSCULAR | Status: AC
  Filled 2022-01-23: qty 50

## 2022-01-24 ENCOUNTER — Other Ambulatory Visit: Payer: Self-pay

## 2022-01-28 MED FILL — Dexamethasone Sodium Phosphate Inj 100 MG/10ML: INTRAMUSCULAR | Qty: 1 | Status: AC

## 2022-01-29 ENCOUNTER — Other Ambulatory Visit: Payer: Self-pay

## 2022-01-29 ENCOUNTER — Inpatient Hospital Stay: Payer: Medicare Other

## 2022-01-29 ENCOUNTER — Inpatient Hospital Stay: Payer: Medicare Other | Attending: Oncology | Admitting: Hematology and Oncology

## 2022-01-29 ENCOUNTER — Other Ambulatory Visit: Payer: Self-pay | Admitting: *Deleted

## 2022-01-29 VITALS — BP 161/85 | HR 121 | Temp 98.4°F | Resp 18 | Ht 61.0 in | Wt 106.0 lb

## 2022-01-29 DIAGNOSIS — C787 Secondary malignant neoplasm of liver and intrahepatic bile duct: Secondary | ICD-10-CM | POA: Insufficient documentation

## 2022-01-29 DIAGNOSIS — C7951 Secondary malignant neoplasm of bone: Secondary | ICD-10-CM | POA: Insufficient documentation

## 2022-01-29 DIAGNOSIS — C50919 Malignant neoplasm of unspecified site of unspecified female breast: Secondary | ICD-10-CM

## 2022-01-29 DIAGNOSIS — C50412 Malignant neoplasm of upper-outer quadrant of left female breast: Secondary | ICD-10-CM | POA: Diagnosis not present

## 2022-01-29 DIAGNOSIS — I10 Essential (primary) hypertension: Secondary | ICD-10-CM

## 2022-01-29 DIAGNOSIS — Z5112 Encounter for antineoplastic immunotherapy: Secondary | ICD-10-CM | POA: Insufficient documentation

## 2022-01-29 DIAGNOSIS — Z170 Estrogen receptor positive status [ER+]: Secondary | ICD-10-CM | POA: Insufficient documentation

## 2022-01-29 LAB — CBC WITH DIFFERENTIAL (CANCER CENTER ONLY)
Abs Immature Granulocytes: 0.02 10*3/uL (ref 0.00–0.07)
Basophils Absolute: 0.1 10*3/uL (ref 0.0–0.1)
Basophils Relative: 1 %
Eosinophils Absolute: 0.2 10*3/uL (ref 0.0–0.5)
Eosinophils Relative: 4 %
HCT: 41.8 % (ref 36.0–46.0)
Hemoglobin: 14.4 g/dL (ref 12.0–15.0)
Immature Granulocytes: 0 %
Lymphocytes Relative: 21 %
Lymphs Abs: 1.2 10*3/uL (ref 0.7–4.0)
MCH: 32.7 pg (ref 26.0–34.0)
MCHC: 34.4 g/dL (ref 30.0–36.0)
MCV: 95 fL (ref 80.0–100.0)
Monocytes Absolute: 0.5 10*3/uL (ref 0.1–1.0)
Monocytes Relative: 9 %
Neutro Abs: 3.9 10*3/uL (ref 1.7–7.7)
Neutrophils Relative %: 65 %
Platelet Count: 253 10*3/uL (ref 150–400)
RBC: 4.4 MIL/uL (ref 3.87–5.11)
RDW: 14 % (ref 11.5–15.5)
WBC Count: 5.9 10*3/uL (ref 4.0–10.5)
nRBC: 0 % (ref 0.0–0.2)

## 2022-01-29 LAB — CMP (CANCER CENTER ONLY)
ALT: 12 U/L (ref 0–44)
AST: 25 U/L (ref 15–41)
Albumin: 3.7 g/dL (ref 3.5–5.0)
Alkaline Phosphatase: 46 U/L (ref 38–126)
Anion gap: 5 (ref 5–15)
BUN: 7 mg/dL — ABNORMAL LOW (ref 8–23)
CO2: 27 mmol/L (ref 22–32)
Calcium: 9.9 mg/dL (ref 8.9–10.3)
Chloride: 107 mmol/L (ref 98–111)
Creatinine: 0.63 mg/dL (ref 0.44–1.00)
GFR, Estimated: >60 mL/min (ref >60)
Glucose, Bld: 119 mg/dL — ABNORMAL HIGH (ref 70–99)
Potassium: 3.9 mmol/L (ref 3.5–5.1)
Sodium: 139 mmol/L (ref 135–145)
Total Bilirubin: 0.4 mg/dL (ref 0.3–1.2)
Total Protein: 6.8 g/dL (ref 6.5–8.1)

## 2022-01-29 NOTE — Progress Notes (Signed)
[facility]:    [name], [name] [address]   DIAGNOSIS:  Cancer Staging  Breast cancer metastasized to multiple sites [name]) Staging form: Breast, AJCC 7th Edition - Clinical: Stage IV ([name], [name], M1) - Signed by Chauncey Cruel, MD on 08/31/2014  Malignant neoplasm of upper-outer quadrant of left breast in female, estrogen receptor positive ([name]) Staging form: Breast, AJCC 7th Edition - Clinical: Stage IV ([name], [place], M1) - Signed by Chauncey Cruel, MD on 09/11/2015   SUMMARY OF ONCOLOGIC HISTORY: Oncology History  Breast cancer metastasized to multiple sites [name])  08/28/2014 Initial Diagnosis   Breast cancer metastasized to multiple sites ([name])   01/28/2021 -  Chemotherapy   Patient is on Treatment Plan : BREAST METASTATIC Fam-Trastuzumab Deruxtecan-nxki (Enhertu) (5.4) q21d     Metastasis to bone ([name])  10/20/2014 Initial Diagnosis   Metastasis to bone ([name])   01/28/2021 -  Chemotherapy   Patient is on Treatment Plan : BREAST METASTATIC Fam-Trastuzumab Deruxtecan-nxki (Enhertu) (5.4) q21d     10/18/2021 Imaging   IMPRESSION: 1. Widespread metastatic disease to the liver appears generally stable compared to the prior study, with exception of a new lesion in segment 3 of the liver which measures 1.4 x 1.0 cm, as detailed above. 2. Cholelithiasis without evidence of acute cholecystitis at this time.     Malignant neoplasm of upper-outer quadrant of left breast in female, estrogen receptor positive ([name])  09/02/1983 Surgery   status post right lumpectomy and axillary node dissection in 1985 followed by radiation at [facility], exact date not clear.   08/16/2014 Imaging   METASTATIC DISEASE 08/16/2014: measurable disease in spine, lung and left breast   (2) evaluation for left shoulder pain led to thoracic spine MRI 08/16/2014 showing a pathologic fracture at T3 with epidural tumor displacing the cord to the  right, but no cord compression. CT scans of the chest, abdomen and pelvis 08/24/2014 showed in addition a mass in the upper outer quadrant left breast measuring 1.6 cm and a nodule in the minor fissure of the right lung measuring 1.2 cm, but no parenchymal lung or liver lesions.   (a) CA 27-29 was noninformative at 38 (09/20/2014)   08/29/2014 Mammogram   mammography and ultrasonography 08/29/2014 show a mass in the upper inner left breast which was palpable,  measuring 2.0 cm by ultrasound. Biopsy of this mass 08/29/2014 showed an invasive breast cancer with both lobular and ductal features, estrogen receptor positive, progesterone receptor weakly positive, with an MIB-1 in the 40% range, HER-2 equivocal (6 else ratio 1.5, but average number her nucleus 5.8    08/31/2014 - 05/29/2017 Anti-estrogen oral therapy   letrozole started 08/31/2014;   (a) palbociclib added Sept 2016 at 75 mg 21/7, with significant neutropenia; not repeated after first cycle  (b) letrozole discontinued 05/29/2017 with evidence of disease progression in the breast   10/20/2014 Surgery   10/20/2014 the patient underwent T2-T3 and T4 decompressive laminectomy with removal of epidural tumor, C7-T4 segmental pedicle screw instrumentation with virage screw system with arrow guidance protocol and C7-T4 posterolateral fusion. The cells were positive for the estrogen receptor. HER-2/neu testing by [name] showed again equivocal results,   11/23/2014 - 12/11/2014 Radiation Therapy   11/23/2014-12/11/2014.  (a) T1-T5 was treated to 30 Gy in 12 fractions at 2.5 Gy per fraction    09/11/2015 Initial Diagnosis   Malignant neoplasm of upper-outer quadrant of left breast in female, estrogen  receptor positive ([name])   05/29/2017 - 06/21/2018 Anti-estrogen oral therapy   fulvestrant started 05/29/2017, last dose 07/22/2018 (discontinued due to pandemic).  (a) started palbociclib 06/29/2017 at 75 mg every other day for 21 days on, 7 days off  (b)  palbociclib discontinued on 3/29 due to progressive fatigue and patient preference   05/04/2018 Surgery   status post left lumpectomy 05/03/2018 showing a pT1b pNX invasive ductal carcinoma, grade 2, with equivocal HER-2 results  (a) opted against adjuvant radiation given presence of stage IV disease   11/02/2018 PET scan   PET scan 11/02/2018 showed no active disease  (a) cerianna scan on 10/31/2019 shows activity in 2 liver lesions, no active bone or lung lesions  (b) abdominal ultrasound 11/08/2019 confirms 2 liver lesions measuring 4.4 and 2.1 cm.  (c) biopsy of 1 of the liver lesions 12/12/2019 confirmed metastatic breast cancer, estrogen and progesterone receptor positive, with an MIB-1 of 10%, and HER-2 positive, with a copy number of 2.12 and the number per cell 6.25  (d) abdominal ultrasound 03/12/2020 shows one of the liver lesions to have increased, while the second lesion has decreased   01/25/2019 - 12/02/2019 Anti-estrogen oral therapy    letrozole started 02/15/2019, discontinued 12/02/2019 with evidence of progression  (a) bone density 09/12/2016 showed a T score of -4.8.    12/08/2019 - 01/24/2021 Anti-estrogen oral therapy   fulvestrant resumed 12/08/2019  (a) palbociclib added at 75 mg daily 21/7 starting 12/20/2019   (b) fulvestrant and palbociclib discontinued September 2022 with evidence of progression.   04/25/2020 -  Antibody Plan   trastuzumab started 04/25/2020, repeat every 28 days  (a) echo 03/12/2020 shows an ejection fraction in the 60-65% range  (b) changed to subQ formulation of trastuzumab 06/20/2020  (c) trastuzumab discontinued September 2022 with MRI of the abdomen showing evidence of progression    Genetic Testing   genetics testing using the Breast/Ovarian Cancer Panel through [facility] [name], MD) found no deleterious mutations in ATM, BARD1, BRCA1, BRCA2, BRIP1, CDH1, CHEK2, EPCAM, FANCC, MLH1, MSH2, MSH6, NBN, PALB2, PMS2, PTEN, RAD51C, RAD51D, STK11,  TP53, or XRCC2      01/28/2021 -  Chemotherapy   Patient is on Treatment Plan : BREAST METASTATIC Fam-Trastuzumab Deruxtecan-nxki (Enhertu) (5.4) q21d     02/07/2021 -  Chemotherapy   Patient is on Treatment Plan :  BREAST METASTATIC fam-trastuzumab deruxtecan-nxki (Enhertu) q21d    starting Enhertu on 02/07/2021  (A) echo 11/21/2020 showed an ejection fraction in the 60-65% range  (B) Enhertu changed to every 4 weeks after the second dose to allow for additional recovery time  (C) treatment held after the third dose with significant drop in the patient's functional status  (D) restaging abdominal MRI 05/14/2021    04/25/2021 Imaging    acute DVT involving the right femoral vein and other proximal veins.  The left side was benign  (A) rivaroxaban started 04/25/2021, she stopped it 07/03/2021   07/17/2021 Imaging   MRI abdomen done 07/17/2021 showed multifocal hepatic metastatic disease with similar distribution when compared to prior study using diffusion-weighted images lesions are quite similar but with perhaps slight interval decrease size and some of the smaller lesions, no gross areas of new disease in the liver   10/18/2021 Imaging   IMPRESSION: 1. Widespread metastatic disease to the liver appears generally stable compared to the prior study, with exception of a new lesion in segment 3 of the liver which measures 1.4 x 1.0 cm, as detailed above. 2. Cholelithiasis  without evidence of acute cholecystitis at this time.     11/14/2021 Imaging   We tried to biopsy the lesion that appears worse on MRI, [place] however didn't show any metastatic lesion in the left lobe of the liver. Probable improvement in metastatic disease within the right lobe of the liver based on ultrasound measurements compared to the prior MRI last month. The largest central lesion shows clear decrease in size.     CURRENT THERAPY: Enhertu  INTERVAL HISTORY:  April PINCOCK 81 y.o. female returns for follow-up of  her metastatic breast cancer while receiving Enhertu.  She hasnt received enhertu in a few weeks. She said she felt very tired after last treatment. Her sister mentioned that with each enhertu she was down for a week or two, but this time it took her long. She has also noted some diarrhea, this has resolved. She complains of BLE, may have started around the time she started Enhertu She was very worried that her BP was high today along with fast heart rate. Back pain was very bothersome, she said this is not from her cancer. She had to take some pain medication No change in bowel habits or urinary habits No new neurological complaints.  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  MEDICAL HISTORY: Past Medical History:  Diagnosis Date   Arthritis    Breast cancer ([place]) 1985/2016   takes [name] daily   Chronic back pain    stenosis/listhesis   Family history of ovarian cancer    Osteoporosis    takes Vit D   Radiation 11/23/14-12/11/14   30 Gy T1-T5     SURGICAL  HISTORY: Past Surgical History:  Procedure Laterality Date   ABDOMINAL HYSTERECTOMY     1980   APPENDECTOMY  8889   APPLICATION OF INTRAOPERATIVE CT SCAN N/A 10/20/2014   Procedure: APPLICATION OF INTRAOPERATIVE CAT SCAN;  Surgeon: [name], MD;  Location: [facility];  Service: Neurosurgery;  Laterality: N/A;   BREAST LUMPECTOMY WITH RADIOACTIVE SEED LOCALIZATION Left 05/03/2018   Procedure: LEFT BREAST LUMPECTOMY WITH BRACKETED RADIOACTIVE SEED LOCALIZATION;  Surgeon: [name], MD;  Location: [facility];  Service: General;  Laterality: Left;   BREAST SURGERY Right 1985   THYROID CYST EXCISION  1967    SOCIAL HISTORY: Social History   Socioeconomic History   Marital status: Divorced    Spouse name: Not on file   Number of children: 1   Years of education: Not on file   Highest education level: Not on file  Occupational History   Not on file  Tobacco  Use   Smoking status: Former    Packs/day: 0.50    Years: 10.00    Total pack years: 5.00    Types: Cigarettes    Quit date: 05/03/1970    Years since quitting: 51.7   Smokeless tobacco: Former   Tobacco comments:    quit smoking 1970's  Substance and Sexual Activity   Alcohol use: Yes    Alcohol/week: 0.0 standard drinks of alcohol    Comment: Rare   Drug use: No   Sexual activity: Not on file  Other Topics Concern   Not on file  Social History Narrative   Not on file   Social Determinants of Health   Financial Resource Strain: Not on file  Food Insecurity: Not on file  Transportation Needs: Not on file  Physical Activity: Not on file  Stress: Not on file  Social Connections: Not on file  Intimate Partner Violence: Not on file    FAMILY HISTORY: Family History  Problem Relation Age of Onset   Heart disease Mother    Hypertension Mother    Heart disease Father    Diabetes Father    Breast cancer Paternal Aunt        4 paternal aunts with breast cancer over 51   Prostate cancer  Paternal Uncle    Stroke Paternal Grandfather    Ovarian cancer Paternal Aunt    H[place]'s disease Other        Nephew, inherited from his father       PHYSICAL EXAMINATION  ECOG PERFORMANCE STATUS: 1 - Symptomatic but completely ambulatory  Vitals:   01/29/22 1237  BP: (!) 161/85  Pulse: (!) 121  Resp: 18  Temp: 98.4 F (36.9 C)  SpO2: 96%    Physical Exam Constitutional:      General: She is not in acute distress.    Appearance: Normal appearance. She is not toxic-appearing.  HENT:     Head: Normocephalic and atraumatic.  Eyes:     General: No scleral icterus. Cardiovascular:     Rate and Rhythm: Normal rate and regular rhythm.     Pulses: Normal pulses.     Heart sounds: Normal heart sounds.  Pulmonary:     Effort: Pulmonary effort is normal.     Breath sounds: Normal breath sounds.  Abdominal:     General: Abdomen is flat. Bowel sounds are normal. There is no distension.     Palpations: Abdomen is soft.     Tenderness: There is no abdominal tenderness.  Musculoskeletal:        General: Swelling (No change overall) present.     Cervical back: Neck supple.  Lymphadenopathy:     Cervical: No cervical adenopathy.  Skin:    General: Skin is warm and dry.     Findings: No rash.  Neurological:     General: No focal deficit present.     Mental Status: She is alert.  Psychiatric:        Mood and Affect: Mood normal.        Behavior: Behavior normal.     LABORATORY DATA:  CBC    Component Value Date/Time   WBC 5.9 01/29/2022 1220   WBC 4.5 11/14/2021 1150   RBC 4.40 01/29/2022 1220   HGB 14.4 01/29/2022 1220   HGB 15.1 05/29/2017 1417   HCT 41.8 01/29/2022 1220   HCT 45.1 05/29/2017 1417   PLT 253 01/29/2022 1220   PLT 262 05/29/2017 1417   MCV 95.0 01/29/2022 1220  MCV 91.7 05/29/2017 1417   MCH 32.7 01/29/2022 1220   MCHC 34.4 01/29/2022 1220   RDW 14.0 01/29/2022 1220   RDW 14.1 05/29/2017 1417   LYMPHSABS 1.2 01/29/2022 1220    LYMPHSABS 1.1 05/29/2017 1417   MONOABS 0.5 01/29/2022 1220   MONOABS 0.4 05/29/2017 1417   EOSABS 0.2 01/29/2022 1220   EOSABS 0.1 05/29/2017 1417   BASOSABS 0.1 01/29/2022 1220   BASOSABS 0.1 05/29/2017 1417    CMP     Component Value Date/Time   NA 139 01/29/2022 1220   NA 141 05/29/2017 1417   K 3.9 01/29/2022 1220   K 3.6 05/29/2017 1417   CL 107 01/29/2022 1220   CO2 27 01/29/2022 1220   CO2 27 05/29/2017 1417   GLUCOSE 119 (H) 01/29/2022 1220   GLUCOSE 111 05/29/2017 1417   BUN 7 (L) 01/29/2022 1220   BUN 17.0 05/29/2017 1417   CREATININE 0.63 01/29/2022 1220   CREATININE 0.66 03/05/2020 1218   CREATININE 0.8 05/29/2017 1417   CALCIUM 9.9 01/29/2022 1220   CALCIUM 10.7 (H) 06/26/2017 1216   CALCIUM 10.4 05/29/2017 1417   PROT 6.8 01/29/2022 1220   PROT 7.3 05/29/2017 1417   ALBUMIN 3.7 01/29/2022 1220   ALBUMIN 3.9 05/29/2017 1417   AST 25 01/29/2022 1220   AST 17 05/29/2017 1417   ALT 12 01/29/2022 1220   ALT 15 05/29/2017 1417   ALKPHOS 46 01/29/2022 1220   ALKPHOS 42 05/29/2017 1417   BILITOT 0.4 01/29/2022 1220   BILITOT 0.30 05/29/2017 1417   GFRNONAA >60 01/29/2022 1220   GFRNONAA 83 03/05/2020 1218   GFRAA 97 03/05/2020 1218     ASSESSMENT and THERAPY PLAN:   Metastasis to bone 81 y.o. [place] woman with stage IV left breast cancer involving liver, bone currently on Enhertu who is here for follow up. Please review history above. She is now on enhertu, recently had to take a break for few weeks since she felt exhausted. She is now here for follow up. We discussed recent imaging results which showed enlarging left hepatic lobe mets. I have ordered repeat US since her last MRI also commented on enlarging hepatic mets where as when we were preparing for a biopsy, she had an [place] which didn't show any liver lesions. We also repeated the ECHO which showed slight change, but given tachycardia and HTN, we discussed about referring to cardio oncology We  also discussed about reducing dose of enhertu to 3.2 mg/kg for future doses while we are evaluating the liver disease.  She is agreeable with the plan.   All questions were answered. The patient knows to call the clinic with any problems, questions or concerns. We can certainly see the patient much sooner if necessary.  Total encounter time:40 minutes*in face-to-face visit time, chart review, lab review, care coordination, order entry, and documentation of the encounter time.   *Total Encounter Time as defined by the Centers for Medicare and Medicaid Services includes, in addition to the face-to-face time of a patient visit (documented in the note above) non-face-to-face time: obtaining and reviewing outside history, ordering and reviewing medications, tests or procedures, care coordination (communications with other health care professionals or caregivers) and documentation in the medical record.

## 2022-01-29 NOTE — Progress Notes (Signed)
Vitals rechecked with BP of 164/100 and pulse of 100.  Echo scheduled for 9/7 at 1230 at [facility]- instructions, time and location given in writing to the patient with sister present.  Copy of labs given.

## 2022-01-30 ENCOUNTER — Ambulatory Visit (HOSPITAL_COMMUNITY)
Admission: RE | Admit: 2022-01-30 | Discharge: 2022-01-30 | Disposition: A | Discharge status: Home / Self Care | Payer: Medicare Other | Source: Ambulatory Visit | Attending: Hematology and Oncology | Admitting: Hematology and Oncology

## 2022-01-30 DIAGNOSIS — C7951 Secondary malignant neoplasm of bone: Secondary | ICD-10-CM | POA: Diagnosis not present

## 2022-01-30 DIAGNOSIS — Z170 Estrogen receptor positive status [ER+]: Secondary | ICD-10-CM | POA: Insufficient documentation

## 2022-01-30 DIAGNOSIS — Z87891 Personal history of nicotine dependence: Secondary | ICD-10-CM | POA: Diagnosis not present

## 2022-01-30 DIAGNOSIS — C50412 Malignant neoplasm of upper-outer quadrant of left female breast: Secondary | ICD-10-CM | POA: Diagnosis not present

## 2022-01-30 DIAGNOSIS — Z8249 Family history of ischemic heart disease and other diseases of the circulatory system: Secondary | ICD-10-CM | POA: Insufficient documentation

## 2022-01-30 DIAGNOSIS — Z0189 Encounter for other specified special examinations: Secondary | ICD-10-CM | POA: Diagnosis not present

## 2022-01-30 DIAGNOSIS — R0600 Dyspnea, unspecified: Secondary | ICD-10-CM | POA: Diagnosis not present

## 2022-01-30 DIAGNOSIS — Z01818 Encounter for other preprocedural examination: Secondary | ICD-10-CM | POA: Insufficient documentation

## 2022-01-30 DIAGNOSIS — I10 Essential (primary) hypertension: Secondary | ICD-10-CM | POA: Insufficient documentation

## 2022-01-31 LAB — ECHOCARDIOGRAM COMPLETE
Area-P 1/2: 7.37 cm2
S' Lateral: 1.2 cm

## 2022-02-01 ENCOUNTER — Encounter: Payer: Self-pay | Admitting: Hematology and Oncology

## 2022-02-01 NOTE — Assessment & Plan Note (Signed)
81 y.o. [place] woman with stage IV left breast cancer involving liver, bone currently on Enhertu who is here for follow up. Please review history above. She is now on enhertu, recently had to take a break for few weeks since she felt exhausted. She is now here for follow up. We discussed recent imaging results which showed enlarging left hepatic lobe mets. I have ordered repeat US since her last MRI also commented on enlarging hepatic mets where as when we were preparing for a biopsy, she had an [place] which didn't show any liver lesions. We also repeated the ECHO which showed slight change, but given tachycardia and HTN, we discussed about referring to cardio oncology We also discussed about reducing dose of enhertu to 3.2 mg/kg for future doses while we are evaluating the liver disease.  She is agreeable with the plan.

## 2022-02-03 ENCOUNTER — Other Ambulatory Visit: Payer: Medicare Other

## 2022-02-03 ENCOUNTER — Telehealth: Payer: Self-pay | Admitting: *Deleted

## 2022-02-03 ENCOUNTER — Ambulatory Visit: Payer: Medicare Other

## 2022-02-04 ENCOUNTER — Other Ambulatory Visit (HOSPITAL_COMMUNITY): Payer: Self-pay | Admitting: Pharmacy

## 2022-02-04 ENCOUNTER — Encounter (HOSPITAL_COMMUNITY): Payer: Self-pay | Admitting: Internal Medicine

## 2022-02-04 ENCOUNTER — Inpatient Hospital Stay (HOSPITAL_COMMUNITY)
Admission: RE | Admit: 2022-02-04 | Discharge: 2022-02-04 | Disposition: A | Discharge status: Home / Self Care | Payer: Medicare Other | Source: Ambulatory Visit | Attending: Internal Medicine | Admitting: Internal Medicine

## 2022-02-04 ENCOUNTER — Other Ambulatory Visit: Payer: Self-pay

## 2022-02-04 ENCOUNTER — Ambulatory Visit (HOSPITAL_COMMUNITY)
Admission: RE | Admit: 2022-02-04 | Discharge: 2022-02-04 | Disposition: A | Discharge status: Home / Self Care | Payer: Medicare Other | Source: Ambulatory Visit | Attending: Internal Medicine | Admitting: Internal Medicine

## 2022-02-04 ENCOUNTER — Other Ambulatory Visit (HOSPITAL_COMMUNITY): Payer: Self-pay | Admitting: Internal Medicine

## 2022-02-04 VITALS — BP 150/90 | HR 120 | Wt 106.2 lb

## 2022-02-04 DIAGNOSIS — R000 Tachycardia, unspecified: Secondary | ICD-10-CM

## 2022-02-04 DIAGNOSIS — M549 Dorsalgia, unspecified: Secondary | ICD-10-CM | POA: Diagnosis not present

## 2022-02-04 DIAGNOSIS — C50412 Malignant neoplasm of upper-outer quadrant of left female breast: Secondary | ICD-10-CM | POA: Diagnosis not present

## 2022-02-04 DIAGNOSIS — Z170 Estrogen receptor positive status [ER+]: Secondary | ICD-10-CM | POA: Insufficient documentation

## 2022-02-04 DIAGNOSIS — I10 Essential (primary) hypertension: Secondary | ICD-10-CM | POA: Insufficient documentation

## 2022-02-04 LAB — TSH: TSH: 4.872 u[IU]/mL — ABNORMAL HIGH (ref 0.350–4.500)

## 2022-02-04 LAB — T4, FREE: Free T4: 0.78 ng/dL (ref 0.61–1.12)

## 2022-02-04 MED ORDER — CARVEDILOL 6.25 MG PO TABS: 6.2500 mg | ORAL_TABLET | Freq: Two times a day (BID) | ORAL | 3 refills | Status: DC

## 2022-02-04 NOTE — Patient Instructions (Signed)
Start Carvedilol 6.25 mg Twice daily  Labs done today, your results will be available in MyChart, we will contact you for abnormal readings.  Your provider has recommended that  you wear a Zio Patch for 7 days.  This monitor will record your heart rhythm for our review.  IF you have any symptoms while wearing the monitor please press the button.  If you have any issues with the patch or you notice a red or orange light on it please call the company at [phone].  Once you remove the patch please mail it back to the company as soon as possible so we can get the results.  Your physician recommends that you schedule a follow-up appointment in: 1 month.  If you have any questions or concerns before your next appointment please send [place] a message through [facility] or call our office at [phone].    TO LEAVE A MESSAGE FOR THE NURSE SELECT OPTION 2, PLEASE LEAVE A MESSAGE INCLUDING: YOUR NAME DATE OF BIRTH CALL BACK NUMBER REASON FOR CALL**this is important as we prioritize the call backs  YOU WILL RECEIVE A CALL BACK THE SAME DAY AS LONG AS YOU CALL BEFORE 4:00 PM  At the [facility], you and your health needs are our priority. As part of our continuing mission to provide you with exceptional heart care, we have created designated Provider Care Teams. These Care Teams include your primary Cardiologist (physician) and Advanced Practice Providers (APPs- Physician Assistants and Nurse Practitioners) who all work together to provide you with the care you need, when you need it.   You may see any of the following providers on your designated Care Team at your next follow up: Dr Glori Bickers Dr Loralie Champagne Dr. Roxana Hires, NP Lyda Jester, [place] [facility] [place], [place] Forestine Na, NP Audry Riles, PharmD   Please be sure to bring in all your medications bottles to every appointment.

## 2022-02-04 NOTE — Progress Notes (Signed)
CARDIO-ONCOLOGY CLINIC CONSULT NOTE  Referring Physician: Dr. Chryl Heck  Primary Care: [name], MD Primary Cardiologist: None  HPI:  April Rosales is an 81 y.o. female with HTN, goiter (s/p surgical resection),  stage IV breast cancer referred by Dr. Chryl Heck for enrollment into the Cardio-Oncology program.  Initially diagnosed with breast cancer in 1985. Underwent lumpectomy and XRT at [facility] to have Stage IV breast CA in 2016 with mets to spine, lung and left breast. Started on letrozole in 4/16. Palbociclib added 9/16. Also had XRT to spine.  Stopped after 1 dose due to neutropenia. Letrozole stopped in 2019 d/t disease progression   Fulvestrant started 1/19 -> 2/20 stopped due to Covid pandemic. Palbociclib restarted 2/19 and stopped 3/19    12/19 undewent left lumpectomy   9/20 letrozole restarted -> stopped 7/21 due to progression   7/21 fluvestrant/palbociclib restarted 7/21 -> stopped 9/22 due to progression  12/21 Herceptin started 12/21 -> 9/22 stopped d/t progression   9/22 started enhertu   Echo 6/22 EF 60-65%  Echo 6/23 EF 55-60% Echo 01/30/22 EF 50-55% (I felt 55-60%)   I reviewed vitals from previous visits SBP 150-160 since 12/22 HR has been 90-105 for nearly 2 years   Here with her sister. Says she doesn't feel any different. Denies SOB, palpitations. Recent hgb 14.4. last TSH 10/21 was normal. Has lost 14 pounds in last year. Has back pain and takes tramadol. Takes Geritol, Vitamin D3, B12 and apple cider vinegar. No stimulants. Drinks 1 cup of coffee per day and 1-2 cups of tea per day. Drinks 10-15 oz of water day.    Review of Systems: [y] = yes, '[ ]'$  = no   General: Weight gain '[ ]'$ ; Weight loss [ y]; Anorexia '[ ]'$ ; Fatigue Blue.Reese ]; Fever '[ ]'$ ; Chills '[ ]'$ ; Weakness [ y]  Cardiac: Chest pain/pressure '[ ]'$ ; Resting SOB '[ ]'$ ; Exertional SOB '[ ]'$ ; Orthopnea '[ ]'$ ; Pedal Edema '[ ]'$ ; Palpitations '[ ]'$ ; Syncope '[ ]'$ ; Presyncope '[ ]'$ ; Paroxysmal nocturnal dyspnea'[ ]'$    Pulmonary: Cough '[ ]'$ ; Wheezing'[ ]'$ ; Hemoptysis'[ ]'$ ; Sputum '[ ]'$ ; Snoring '[ ]'$   GI: Vomiting'[ ]'$ ; Dysphagia'[ ]'$ ; Melena'[ ]'$ ; Hematochezia '[ ]'$ ; Heartburn'[ ]'$ ; Abdominal pain '[ ]'$ ; Constipation '[ ]'$ ; Diarrhea '[ ]'$ ; BRBPR '[ ]'$   GU: Hematuria'[ ]'$ ; Dysuria '[ ]'$ ; Nocturia'[ ]'$   Vascular: Pain in legs with walking '[ ]'$ ; Pain in feet with lying flat '[ ]'$ ; Non-healing sores '[ ]'$ ; Stroke '[ ]'$ ; TIA '[ ]'$ ; Slurred speech '[ ]'$ ;  Neuro: Headaches'[ ]'$ ; Vertigo'[ ]'$ ; Seizures'[ ]'$ ; Paresthesias'[ ]'$ ;Blurred vision '[ ]'$ ; Diplopia '[ ]'$ ; Vision changes '[ ]'$   Ortho/Skin: Arthritis [ y]; Joint pain Blue.Reese ]; Muscle pain '[ ]'$ ; Joint swelling '[ ]'$ ; Back Pain [ y]; Rash '[ ]'$   Psych: Depression'[ ]'$ ; Anxiety'[ ]'$   Heme: Bleeding problems '[ ]'$ ; Clotting disorders '[ ]'$ ; Anemia '[ ]'$   Endocrine: Diabetes '[ ]'$ ; Thyroid dysfunction'[ ]'$    Past Medical History:  Diagnosis Date   Arthritis    Breast cancer ([place]) 1985/2016   takes [name] daily   Chronic back pain    stenosis/listhesis   Family history of ovarian cancer    Osteoporosis    takes Vit D   Radiation 11/23/14-12/11/14   30 Gy T1-T5     Current Outpatient Medications  Medication Sig Dispense Refill   Cholecalciferol (VITAMIN D3 ADULT GUMMIES PO) Take 2,000 Units by mouth daily.      Cyanocobalamin (VITAMIN B-12 PO) Take by mouth  daily. Takes every other day     Iron-Vitamins (GERITOL PO) Take by mouth daily.     OVER THE COUNTER MEDICATION OTC Apple Cider Vinegar 1 capsule daily.     traMADol (ULTRAM) 50 MG tablet Take 50 mg by mouth as needed.     No current facility-administered medications for this encounter.    Allergies  Allergen Reactions   Ativan [Lorazepam]     Made her crazy   Dilaudid [Hydromorphone Hcl]     Doesn't want   Haldol [Haloperidol Lactate]     Made her crazy   Xarelto [Rivaroxaban]     Pt reports debilitating dizziness.      Social History   Socioeconomic History   Marital status: Divorced    Spouse name: Not on file   Number of children: 1   Years of education:  Not on file   Highest education level: Not on file  Occupational History   Not on file  Tobacco Use   Smoking status: Former    Packs/day: 0.50    Years: 10.00    Total pack years: 5.00    Types: Cigarettes    Quit date: 05/03/1970    Years since quitting: 51.7   Smokeless tobacco: Former   Tobacco comments:    quit smoking 1970's  Substance and Sexual Activity   Alcohol use: Yes    Alcohol/week: 0.0 standard drinks of alcohol    Comment: Rare   Drug use: No   Sexual activity: Not on file  Other Topics Concern   Not on file  Social History Narrative   Not on file   Social Determinants of Health   Financial Resource Strain: Not on file  Food Insecurity: Not on file  Transportation Needs: Not on file  Physical Activity: Not on file  Stress: Not on file  Social Connections: Not on file  Intimate Partner Violence: Not on file      Family History  Problem Relation Age of Onset   Heart disease Mother    Hypertension Mother    Heart disease Father    Diabetes Father    Breast cancer Paternal Aunt        4 paternal aunts with breast cancer over 80   Prostate cancer Paternal Uncle    Stroke Paternal Grandfather    Ovarian cancer Paternal Aunt    Huntington's disease Other        Nephew, inherited from his father    Vitals:   02/04/22 1530  BP: (!) 150/90  Pulse: (!) 120  SpO2: 96%  Weight: 48.2 kg (106 lb 3.2 oz)    PHYSICAL EXAM: General:  Elderly frail appearing. No respiratory difficulty HEENT: normal Neck: supple. no JVD. Carotids 2+ bilat; no bruits. No lymphadenopathy or thryomegaly appreciated. + goiter scar Cor: PMI nondisplaced. Tachy regular Lungs: clear Abdomen: soft, nontender, nondistended. No hepatosplenomegaly. No bruits or masses. Good bowel sounds. Extremities: no cyanosis, clubbing, rash, edema Neuro: alert & oriented x 3, cranial nerves grossly intact. moves all 4 extremities w/o difficulty. Affect pleasant.  ECG: Sinus tach 118 No ST-T  wave abnormalities.     ASSESSMENT & PLAN:  1. Stage IV Breast Cancer - Explained incidence of cardiotoxicity with various agents she has ahd and role of Cardio-oncology clinic at length. Echo images reviewed personally. All parameters stable. I do not see any evidence of cardiotoxicity.   2. Sinus tachycardia - unclear etiology - in reviewing flowsheets has been present for nearly 2 years but  worse lately.  - will check thyroid panel. Recent hgb ok. Having some pain but does not appear to be too bad - Will start low dose carvedilol for BP and HR control - Place Zio to see trends and nocturnal HR  3. HTN - has been elevated > 2 years - start carvedilol as above  Glori Bickers, MD  5:23 PM   Glori Bickers, MD  4:00 PM

## 2022-02-05 ENCOUNTER — Other Ambulatory Visit: Payer: Self-pay

## 2022-02-05 LAB — T3, FREE: T3, Free: 3.7 pg/mL (ref 2.0–4.4)

## 2022-02-06 ENCOUNTER — Ambulatory Visit (HOSPITAL_COMMUNITY): Payer: Medicare Other | Admitting: Internal Medicine

## 2022-02-06 ENCOUNTER — Encounter: Payer: Self-pay | Admitting: Hematology and Oncology

## 2022-02-06 NOTE — Telephone Encounter (Signed)
No entry 

## 2022-02-07 MED FILL — Dexamethasone Sodium Phosphate Inj 100 MG/10ML: INTRAMUSCULAR | Qty: 1 | Status: AC

## 2022-02-08 ENCOUNTER — Telehealth: Payer: Self-pay | Admitting: Physician Assistant

## 2022-02-08 NOTE — Telephone Encounter (Addendum)
81 yo female seen in [facility] for evaluation of cardiotoxicity in the setting of chemotherapy for breast cancer on 02/04/2022.  She was noted to have sinus tachycardia and elevated blood pressure for >2 years.  She was started on carvedilol 6.25 mg twice daily.  She took it for 2 days without significant issues. Yesterday, she started having blurred vision at times. She suspects it is from the Carvedilol. She did not have vision loss or symptoms c/w amaurosis fugax. She did not have facial droop, speech difficulty or unilateral weakness. She has not had HA, chest pain, shortness of breath, syncope. She has otherwise felt fine/normal. She has no way to check her BP.  In clinical trials, visual impairment occurred in 5% of patients and blurred vision in 2-3%.  PLAN:  Stop Carvedilol. If symptoms recur, she has been advised to go to the ED. If no further symptoms off Carvedilol, could consider alterate beta-blocker or calcium channel blocker. Richardson Dopp, PA-C    02/08/2022 9:10 AM

## 2022-02-10 ENCOUNTER — Other Ambulatory Visit: Payer: Self-pay | Admitting: *Deleted

## 2022-02-10 ENCOUNTER — Inpatient Hospital Stay: Payer: Medicare Other

## 2022-02-10 ENCOUNTER — Other Ambulatory Visit: Payer: Self-pay | Admitting: Hematology and Oncology

## 2022-02-10 ENCOUNTER — Other Ambulatory Visit: Payer: Self-pay

## 2022-02-10 VITALS — BP 149/98 | HR 99 | Temp 98.4°F | Resp 19 | Wt 105.5 lb

## 2022-02-10 DIAGNOSIS — Z5112 Encounter for antineoplastic immunotherapy: Secondary | ICD-10-CM | POA: Diagnosis not present

## 2022-02-10 DIAGNOSIS — Z170 Estrogen receptor positive status [ER+]: Secondary | ICD-10-CM | POA: Diagnosis not present

## 2022-02-10 DIAGNOSIS — C50919 Malignant neoplasm of unspecified site of unspecified female breast: Secondary | ICD-10-CM

## 2022-02-10 DIAGNOSIS — C787 Secondary malignant neoplasm of liver and intrahepatic bile duct: Secondary | ICD-10-CM | POA: Diagnosis not present

## 2022-02-10 DIAGNOSIS — C7951 Secondary malignant neoplasm of bone: Secondary | ICD-10-CM

## 2022-02-10 DIAGNOSIS — C50412 Malignant neoplasm of upper-outer quadrant of left female breast: Secondary | ICD-10-CM

## 2022-02-10 LAB — CMP (CANCER CENTER ONLY)
ALT: 11 U/L (ref 0–44)
AST: 23 U/L (ref 15–41)
Albumin: 3.6 g/dL (ref 3.5–5.0)
Alkaline Phosphatase: 42 U/L (ref 38–126)
Anion gap: 7 (ref 5–15)
BUN: 7 mg/dL — ABNORMAL LOW (ref 8–23)
CO2: 27 mmol/L (ref 22–32)
Calcium: 10 mg/dL (ref 8.9–10.3)
Chloride: 106 mmol/L (ref 98–111)
Creatinine: 0.56 mg/dL (ref 0.44–1.00)
GFR, Estimated: >60 mL/min (ref >60)
Glucose, Bld: 125 mg/dL — ABNORMAL HIGH (ref 70–99)
Potassium: 3.9 mmol/L (ref 3.5–5.1)
Sodium: 140 mmol/L (ref 135–145)
Total Bilirubin: 0.4 mg/dL (ref 0.3–1.2)
Total Protein: 6.5 g/dL (ref 6.5–8.1)

## 2022-02-10 LAB — CBC WITH DIFFERENTIAL (CANCER CENTER ONLY)
Abs Immature Granulocytes: 0.01 10*3/uL (ref 0.00–0.07)
Basophils Absolute: 0.1 10*3/uL (ref 0.0–0.1)
Basophils Relative: 1 %
Eosinophils Absolute: 0.3 10*3/uL (ref 0.0–0.5)
Eosinophils Relative: 5 %
HCT: 42.2 % (ref 36.0–46.0)
Hemoglobin: 14.5 g/dL (ref 12.0–15.0)
Immature Granulocytes: 0 %
Lymphocytes Relative: 22 %
Lymphs Abs: 1.3 10*3/uL (ref 0.7–4.0)
MCH: 32.4 pg (ref 26.0–34.0)
MCHC: 34.4 g/dL (ref 30.0–36.0)
MCV: 94.4 fL (ref 80.0–100.0)
Monocytes Absolute: 0.5 10*3/uL (ref 0.1–1.0)
Monocytes Relative: 9 %
Neutro Abs: 3.7 10*3/uL (ref 1.7–7.7)
Neutrophils Relative %: 63 %
Platelet Count: 240 10*3/uL (ref 150–400)
RBC: 4.47 MIL/uL (ref 3.87–5.11)
RDW: 13.7 % (ref 11.5–15.5)
WBC Count: 5.8 10*3/uL (ref 4.0–10.5)
nRBC: 0 % (ref 0.0–0.2)

## 2022-02-10 MED ORDER — ACETAMINOPHEN 325 MG PO TABS
650.0000 mg | ORAL_TABLET | Freq: Once | ORAL | Status: AC
  Administered 2022-02-10: 650 mg via ORAL
  Filled 2022-02-10: qty 2

## 2022-02-10 MED ORDER — TRAMADOL HCL 50 MG PO TABS: 50.0000 mg | ORAL_TABLET | Freq: Three times a day (TID) | ORAL | 0 refills | Status: DC | PRN

## 2022-02-10 MED ORDER — PALONOSETRON HCL INJECTION 0.25 MG/5ML
0.2500 mg | Freq: Once | INTRAVENOUS | Status: AC
  Administered 2022-02-10: 0.25 mg via INTRAVENOUS
  Filled 2022-02-10: qty 5

## 2022-02-10 MED ORDER — DIPHENHYDRAMINE HCL 25 MG PO CAPS
50.0000 mg | ORAL_CAPSULE | Freq: Once | ORAL | Status: AC
  Administered 2022-02-10: 50 mg via ORAL
  Filled 2022-02-10: qty 2

## 2022-02-10 MED ORDER — DEXTROSE 5 % IV SOLN: Freq: Once | INTRAVENOUS | Status: AC

## 2022-02-10 MED ORDER — SODIUM CHLORIDE 0.9 % IV SOLN
10.0000 mg | Freq: Once | INTRAVENOUS | Status: AC
  Administered 2022-02-10: 10 mg via INTRAVENOUS
  Filled 2022-02-10: qty 1
  Filled 2022-02-10: qty 10

## 2022-02-10 MED ORDER — FAM-TRASTUZUMAB DERUXTECAN-NXKI CHEMO 100 MG IV SOLR
3.2000 mg/kg | Freq: Once | INTRAVENOUS | Status: AC
  Administered 2022-02-10: 158 mg via INTRAVENOUS
  Filled 2022-02-10: qty 7.9

## 2022-02-10 NOTE — Patient Instructions (Signed)
[facility] MEDICAL ONCOLOGY  Discharge Instructions: Thank you for choosing [facility] to provide your oncology and hematology care.   If you have a lab appointment with the Cancer Center, please go directly to the Cancer Center and check in at the registration area.   Wear comfortable clothing and clothing appropriate for easy access to any Portacath or PICC line.   We strive to give you quality time with your provider. You may need to reschedule your appointment if you arrive late (15 or more minutes).  Arriving late affects you and other patients whose appointments are after yours.  Also, if you miss three or more appointments without notifying the office, you may be dismissed from the clinic at the provider's discretion.      For prescription refill requests, have your pharmacy contact our office and allow 72 hours for refills to be completed.    Today you received the following chemotherapy and/or immunotherapy agents: fam-trastuzumab (Enhertu)      To help prevent nausea and vomiting after your treatment, we encourage you to take your nausea medication as directed.  BELOW ARE SYMPTOMS THAT SHOULD BE REPORTED IMMEDIATELY: *FEVER GREATER THAN 100.4 F (38 C) OR HIGHER *CHILLS OR SWEATING *NAUSEA AND VOMITING THAT IS NOT CONTROLLED WITH YOUR NAUSEA MEDICATION *UNUSUAL SHORTNESS OF BREATH *UNUSUAL BRUISING OR BLEEDING *URINARY PROBLEMS (pain or burning when urinating, or frequent urination) *BOWEL PROBLEMS (unusual diarrhea, constipation, pain near the anus) TENDERNESS IN MOUTH AND THROAT WITH OR WITHOUT PRESENCE OF ULCERS (sore throat, sores in mouth, or a toothache) UNUSUAL RASH, SWELLING OR PAIN  UNUSUAL VAGINAL DISCHARGE OR ITCHING   Items with * indicate a potential emergency and should be followed up as soon as possible or go to the Emergency Department if any problems should occur.  Please show the CHEMOTHERAPY ALERT CARD or IMMUNOTHERAPY ALERT CARD  at check-in to the Emergency Department and triage nurse.  Should you have questions after your visit or need to cancel or reschedule your appointment, please contact [facility] MEDICAL ONCOLOGY  Dept: [phone]  and follow the prompts.  Office hours are 8:00 a.m. to 4:30 p.m. Monday - Friday. Please note that voicemails left after 4:00 p.m. may not be returned until the following business day.  We are closed weekends and major holidays. You have access to a nurse at all times for urgent questions. Please call the main number to the clinic Dept: [phone] and follow the prompts.   For any non-urgent questions, you may also contact your provider using MyChart. We now offer e-Visits for anyone 18 and older to request care online for non-urgent symptoms. For details visit [email or web address].   Also download the MyChart app! Go to the app store, search "MyChart", open the app, select [facility], and log in with your MyChart username and password.  Masks are optional in the cancer centers. If you would like for your care team to wear a mask while they are taking care of you, please let them know. You may have one support person who is at least 82 years old accompany you for your appointments. 

## 2022-02-12 ENCOUNTER — Ambulatory Visit (HOSPITAL_COMMUNITY): Admission: RE | Admit: 2022-02-12 | Payer: Medicare Other | Source: Ambulatory Visit

## 2022-02-13 NOTE — Telephone Encounter (Signed)
Spoke w/pt, she is feeling better off Carvedilol and has not had any trouble with blurry vision since being off. She completed her Zio and mailed it in yesterday. Advised to remain off carvedilol for now, if further changes are needed I will call her back.

## 2022-02-19 ENCOUNTER — Ambulatory Visit (HOSPITAL_COMMUNITY): Admission: RE | Admit: 2022-02-19 | Payer: Medicare Other | Source: Ambulatory Visit

## 2022-02-20 ENCOUNTER — Ambulatory Visit: Payer: Medicare Other

## 2022-02-20 ENCOUNTER — Other Ambulatory Visit: Payer: Medicare Other

## 2022-02-22 DIAGNOSIS — R000 Tachycardia, unspecified: Secondary | ICD-10-CM | POA: Diagnosis not present

## 2022-02-25 NOTE — Addendum Note (Signed)
Encounter addended by: Micki Riley, RN on: 02/25/2022 1:15 PM  Actions taken: Imaging Exam ended

## 2022-02-26 ENCOUNTER — Ambulatory Visit (HOSPITAL_COMMUNITY)
Admission: RE | Admit: 2022-02-26 | Discharge: 2022-02-26 | Disposition: A | Discharge status: Home / Self Care | Payer: Medicare Other | Source: Ambulatory Visit | Attending: Hematology and Oncology | Admitting: Hematology and Oncology

## 2022-02-26 DIAGNOSIS — C7951 Secondary malignant neoplasm of bone: Secondary | ICD-10-CM | POA: Diagnosis not present

## 2022-02-26 DIAGNOSIS — C50412 Malignant neoplasm of upper-outer quadrant of left female breast: Secondary | ICD-10-CM | POA: Diagnosis not present

## 2022-02-26 DIAGNOSIS — C50919 Malignant neoplasm of unspecified site of unspecified female breast: Secondary | ICD-10-CM | POA: Diagnosis not present

## 2022-02-26 DIAGNOSIS — Z170 Estrogen receptor positive status [ER+]: Secondary | ICD-10-CM

## 2022-02-26 DIAGNOSIS — K8020 Calculus of gallbladder without cholecystitis without obstruction: Secondary | ICD-10-CM | POA: Diagnosis not present

## 2022-02-28 MED FILL — Dexamethasone Sodium Phosphate Inj 100 MG/10ML: INTRAMUSCULAR | Qty: 1 | Status: AC

## 2022-03-03 ENCOUNTER — Inpatient Hospital Stay (HOSPITAL_BASED_OUTPATIENT_CLINIC_OR_DEPARTMENT_OTHER): Payer: Medicare Other | Admitting: Hematology and Oncology

## 2022-03-03 ENCOUNTER — Encounter: Payer: Self-pay | Admitting: Hematology and Oncology

## 2022-03-03 ENCOUNTER — Inpatient Hospital Stay: Payer: Medicare Other | Attending: Oncology

## 2022-03-03 ENCOUNTER — Inpatient Hospital Stay: Payer: Medicare Other

## 2022-03-03 ENCOUNTER — Other Ambulatory Visit: Payer: Self-pay

## 2022-03-03 VITALS — HR 96

## 2022-03-03 VITALS — BP 167/88 | HR 105 | Temp 98.1°F | Resp 18 | Ht 61.0 in | Wt 105.1 lb

## 2022-03-03 DIAGNOSIS — C7951 Secondary malignant neoplasm of bone: Secondary | ICD-10-CM

## 2022-03-03 DIAGNOSIS — C50919 Malignant neoplasm of unspecified site of unspecified female breast: Secondary | ICD-10-CM | POA: Diagnosis not present

## 2022-03-03 DIAGNOSIS — Z170 Estrogen receptor positive status [ER+]: Secondary | ICD-10-CM

## 2022-03-03 DIAGNOSIS — C787 Secondary malignant neoplasm of liver and intrahepatic bile duct: Secondary | ICD-10-CM | POA: Diagnosis not present

## 2022-03-03 DIAGNOSIS — C50412 Malignant neoplasm of upper-outer quadrant of left female breast: Secondary | ICD-10-CM | POA: Diagnosis not present

## 2022-03-03 DIAGNOSIS — M810 Age-related osteoporosis without current pathological fracture: Secondary | ICD-10-CM

## 2022-03-03 DIAGNOSIS — Z5112 Encounter for antineoplastic immunotherapy: Secondary | ICD-10-CM | POA: Diagnosis not present

## 2022-03-03 LAB — CBC WITH DIFFERENTIAL (CANCER CENTER ONLY)
Abs Immature Granulocytes: 0.01 10*3/uL (ref 0.00–0.07)
Basophils Absolute: 0.1 10*3/uL (ref 0.0–0.1)
Basophils Relative: 1 %
Eosinophils Absolute: 0.2 10*3/uL (ref 0.0–0.5)
Eosinophils Relative: 3 %
HCT: 40.5 % (ref 36.0–46.0)
Hemoglobin: 14.3 g/dL (ref 12.0–15.0)
Immature Granulocytes: 0 %
Lymphocytes Relative: 18 %
Lymphs Abs: 0.9 10*3/uL (ref 0.7–4.0)
MCH: 33.1 pg (ref 26.0–34.0)
MCHC: 35.3 g/dL (ref 30.0–36.0)
MCV: 93.8 fL (ref 80.0–100.0)
Monocytes Absolute: 0.6 10*3/uL (ref 0.1–1.0)
Monocytes Relative: 11 %
Neutro Abs: 3.4 10*3/uL (ref 1.7–7.7)
Neutrophils Relative %: 67 %
Platelet Count: 258 10*3/uL (ref 150–400)
RBC: 4.32 MIL/uL (ref 3.87–5.11)
RDW: 14.1 % (ref 11.5–15.5)
WBC Count: 5.2 10*3/uL (ref 4.0–10.5)
nRBC: 0 % (ref 0.0–0.2)

## 2022-03-03 LAB — CMP (CANCER CENTER ONLY)
ALT: 11 U/L (ref 0–44)
AST: 22 U/L (ref 15–41)
Albumin: 3.7 g/dL (ref 3.5–5.0)
Alkaline Phosphatase: 42 U/L (ref 38–126)
Anion gap: 5 (ref 5–15)
BUN: 9 mg/dL (ref 8–23)
CO2: 30 mmol/L (ref 22–32)
Calcium: 10.1 mg/dL (ref 8.9–10.3)
Chloride: 108 mmol/L (ref 98–111)
Creatinine: 0.56 mg/dL (ref 0.44–1.00)
GFR, Estimated: >60 mL/min (ref >60)
Glucose, Bld: 114 mg/dL — ABNORMAL HIGH (ref 70–99)
Potassium: 3.7 mmol/L (ref 3.5–5.1)
Sodium: 143 mmol/L (ref 135–145)
Total Bilirubin: 0.4 mg/dL (ref 0.3–1.2)
Total Protein: 6.5 g/dL (ref 6.5–8.1)

## 2022-03-03 MED ORDER — FAM-TRASTUZUMAB DERUXTECAN-NXKI CHEMO 100 MG IV SOLR
3.2000 mg/kg | Freq: Once | INTRAVENOUS | Status: AC
  Administered 2022-03-03: 158 mg via INTRAVENOUS
  Filled 2022-03-03: qty 7.9

## 2022-03-03 MED ORDER — DEXTROSE 5 % IV SOLN: Freq: Once | INTRAVENOUS | Status: AC

## 2022-03-03 MED ORDER — DIAZEPAM 5 MG PO TABS: 5.0000 mg | ORAL_TABLET | Freq: Four times a day (QID) | ORAL | 0 refills | Status: DC | PRN

## 2022-03-03 MED ORDER — SODIUM CHLORIDE 0.9 % IV SOLN
10.0000 mg | Freq: Once | INTRAVENOUS | Status: AC
  Administered 2022-03-03: 10 mg via INTRAVENOUS
  Filled 2022-03-03: qty 10

## 2022-03-03 MED ORDER — ACETAMINOPHEN 325 MG PO TABS
650.0000 mg | ORAL_TABLET | Freq: Once | ORAL | Status: AC
  Administered 2022-03-03: 650 mg via ORAL
  Filled 2022-03-03: qty 2

## 2022-03-03 MED ORDER — DENOSUMAB 60 MG/ML ~~LOC~~ SOSY
60.0000 mg | PREFILLED_SYRINGE | Freq: Once | SUBCUTANEOUS | Status: AC
  Administered 2022-03-03: 60 mg via SUBCUTANEOUS
  Filled 2022-03-03: qty 1

## 2022-03-03 MED ORDER — DIPHENHYDRAMINE HCL 25 MG PO CAPS
50.0000 mg | ORAL_CAPSULE | Freq: Once | ORAL | Status: AC
  Administered 2022-03-03: 50 mg via ORAL
  Filled 2022-03-03: qty 2

## 2022-03-03 MED ORDER — PALONOSETRON HCL INJECTION 0.25 MG/5ML
0.2500 mg | Freq: Once | INTRAVENOUS | Status: AC
  Administered 2022-03-03: 0.25 mg via INTRAVENOUS
  Filled 2022-03-03: qty 5

## 2022-03-03 NOTE — Progress Notes (Signed)
[facility]:    [name], [name] [id number]   DIAGNOSIS:  Cancer Staging  Breast cancer metastasized to multiple sites [name]) Staging form: Breast, AJCC 7th Edition - Clinical: Stage IV ([name], [name], M1) - Signed by Chauncey Cruel, MD on 08/31/2014  Malignant neoplasm of upper-outer quadrant of left breast in female, estrogen receptor positive ([name]) Staging form: Breast, AJCC 7th Edition - Clinical: Stage IV ([name], [place], M1) - Signed by Chauncey Cruel, MD on 09/11/2015   SUMMARY OF ONCOLOGIC HISTORY: Oncology History  Breast cancer metastasized to multiple sites [name])  08/28/2014 Initial Diagnosis   Breast cancer metastasized to multiple sites ([name])   01/28/2021 -  Chemotherapy   Patient is on Treatment Plan : BREAST METASTATIC Fam-Trastuzumab Deruxtecan-nxki (Enhertu) (5.4) q21d     Metastasis to bone ([name])  10/20/2014 Initial Diagnosis   Metastasis to bone ([name])   01/28/2021 -  Chemotherapy   Patient is on Treatment Plan : BREAST METASTATIC Fam-Trastuzumab Deruxtecan-nxki (Enhertu) (5.4) q21d     10/18/2021 Imaging   IMPRESSION: 1. Widespread metastatic disease to the liver appears generally stable compared to the prior study, with exception of a new lesion in segment 3 of the liver which measures 1.4 x 1.0 cm, as detailed above. 2. Cholelithiasis without evidence of acute cholecystitis at this time.     Malignant neoplasm of upper-outer quadrant of left breast in female, estrogen receptor positive ([name])  09/02/1983 Surgery   status post right lumpectomy and axillary node dissection in 1985 followed by radiation at [facility], exact date not clear.   08/16/2014 Imaging   METASTATIC DISEASE 08/16/2014: measurable disease in spine, lung and left breast   (2) evaluation for left shoulder pain led to thoracic spine MRI 08/16/2014 showing a pathologic fracture at T3 with epidural tumor displacing the cord to the  right, but no cord compression. CT scans of the chest, abdomen and pelvis 08/24/2014 showed in addition a mass in the upper outer quadrant left breast measuring 1.6 cm and a nodule in the minor fissure of the right lung measuring 1.2 cm, but no parenchymal lung or liver lesions.   (a) CA 27-29 was noninformative at 38 (09/20/2014)   08/29/2014 Mammogram   mammography and ultrasonography 08/29/2014 show a mass in the upper inner left breast which was palpable,  measuring 2.0 cm by ultrasound. Biopsy of this mass 08/29/2014 showed an invasive breast cancer with both lobular and ductal features, estrogen receptor positive, progesterone receptor weakly positive, with an MIB-1 in the 40% range, HER-2 equivocal (6 else ratio 1.5, but average number her nucleus 5.8    08/31/2014 - 05/29/2017 Anti-estrogen oral therapy   letrozole started 08/31/2014;   (a) palbociclib added Sept 2016 at 75 mg 21/7, with significant neutropenia; not repeated after first cycle  (b) letrozole discontinued 05/29/2017 with evidence of disease progression in the breast   10/20/2014 Surgery   10/20/2014 the patient underwent T2-T3 and T4 decompressive laminectomy with removal of epidural tumor, C7-T4 segmental pedicle screw instrumentation with virage screw system with arrow guidance protocol and C7-T4 posterolateral fusion. The cells were positive for the estrogen receptor. HER-2/neu testing by [name] showed again equivocal results,   11/23/2014 - 12/11/2014 Radiation Therapy   11/23/2014-12/11/2014.  (a) T1-T5 was treated to 30 Gy in 12 fractions at 2.5 Gy per fraction    09/11/2015 Initial Diagnosis   Malignant neoplasm of upper-outer quadrant of left breast in female, estrogen  receptor positive ([name])   05/29/2017 - 06/21/2018 Anti-estrogen oral therapy   fulvestrant started 05/29/2017, last dose 07/22/2018 (discontinued due to pandemic).  (a) started palbociclib 06/29/2017 at 75 mg every other day for 21 days on, 7 days off  (b)  palbociclib discontinued on 3/29 due to progressive fatigue and patient preference   05/04/2018 Surgery   status post left lumpectomy 05/03/2018 showing a pT1b pNX invasive ductal carcinoma, grade 2, with equivocal HER-2 results  (a) opted against adjuvant radiation given presence of stage IV disease   11/02/2018 PET scan   PET scan 11/02/2018 showed no active disease  (a) [place]ianna scan on 10/31/2019 shows activity in 2 liver lesions, no active bone or lung lesions  (b) abdominal ultrasound 11/08/2019 confirms 2 liver lesions measuring 4.4 and 2.1 cm.  (c) biopsy of 1 of the liver lesions 12/12/2019 confirmed metastatic breast cancer, estrogen and progesterone receptor positive, with an MIB-1 of 10%, and HER-2 positive, with a copy number of 2.12 and the number per cell 6.25  (d) abdominal ultrasound 03/12/2020 shows one of the liver lesions to have increased, while the second lesion has decreased   01/25/2019 - 12/02/2019 Anti-estrogen oral therapy    letrozole started 02/15/2019, discontinued 12/02/2019 with evidence of progression  (a) bone density 09/12/2016 showed a T score of -4.8.    12/08/2019 - 01/24/2021 Anti-estrogen oral therapy   fulvestrant resumed 12/08/2019  (a) palbociclib added at 75 mg daily 21/7 starting 12/20/2019   (b) fulvestrant and palbociclib discontinued September 2022 with evidence of progression.   04/25/2020 -  Antibody Plan   trastuzumab started 04/25/2020, repeat every 28 days  (a) echo 03/12/2020 shows an ejection fraction in the 60-65% range  (b) changed to subQ formulation of trastuzumab 06/20/2020  (c) trastuzumab discontinued September 2022 with MRI of the abdomen showing evidence of progression    Genetic Testing   genetics testing using the Breast/Ovarian Cancer Panel through [facility] [name], MD) found no deleterious mutations in ATM, BARD1, BRCA1, BRCA2, BRIP1, CDH1, CHEK2, EPCAM, FANCC, MLH1, MSH2, MSH6, NBN, PALB2, PMS2, PTEN, RAD51C, RAD51D, STK11,  TP53, or XRCC2      01/28/2021 -  Chemotherapy   Patient is on Treatment Plan : BREAST METASTATIC Fam-Trastuzumab Deruxtecan-nxki (Enhertu) (5.4) q21d     02/07/2021 -  Chemotherapy   Patient is on Treatment Plan :  BREAST METASTATIC fam-trastuzumab deruxtecan-nxki (Enhertu) q21d    starting Enhertu on 02/07/2021  (A) echo 11/21/2020 showed an ejection fraction in the 60-65% range  (B) Enhertu changed to every 4 weeks after the second dose to allow for additional recovery time  (C) treatment held after the third dose with significant drop in the patient's functional status  (D) restaging abdominal MRI 05/14/2021    04/25/2021 Imaging    acute DVT involving the right femoral vein and other proximal veins.  The left side was benign  (A) rivaroxaban started 04/25/2021, she stopped it 07/03/2021   07/17/2021 Imaging   MRI abdomen done 07/17/2021 showed multifocal hepatic metastatic disease with similar distribution when compared to prior study using diffusion-weighted images lesions are quite similar but with perhaps slight interval decrease size and some of the smaller lesions, no gross areas of new disease in the liver   10/18/2021 Imaging   IMPRESSION: 1. Widespread metastatic disease to the liver appears generally stable compared to the prior study, with exception of a new lesion in segment 3 of the liver which measures 1.4 x 1.0 cm, as detailed above. 2. Cholelithiasis  without evidence of acute cholecystitis at this time.     11/14/2021 Imaging   We tried to biopsy the lesion that appears worse on MRI, [place] however didn't show any metastatic lesion in the left lobe of the liver. Probable improvement in metastatic disease within the right lobe of the liver based on ultrasound measurements compared to the prior MRI last month. The largest central lesion shows clear decrease in size.     CURRENT THERAPY: Enhertu  INTERVAL HISTORY:  April Rosales 81 y.o. female returns for follow-up of  her metastatic breast cancer while receiving Enhertu.  She had a break in treatment from mid July to mid September.  She felt very poorly after July's dose of Enhertu and wanted to take a break.  She then came for repeat imaging which showed progression in one of the liver lesions but this was thought to be secondary to treatment labs.  She is now back on Enhertu, tolerated the last cycle well except for fatigue which last about a week after treatment.  Besides that she denies any nausea, vomiting or diarrhea.  She has constipation.  She denies any chest pain, shortness of breath or cough. No new neurological complaints. Rest of the pertinent 10 point ROS reviewed and negative  Patient Active Problem List   Diagnosis Date Noted   Acute deep vein thrombosis (DVT) of right femoral vein (HCC) 09/18/2021   Thoracic compression fracture (HCC) 05/15/2020   Closed compression fracture of body of L1 vertebra ([place]) 10/04/2019   Aortic atherosclerosis ([place]) 09/26/2019   Neuropathy 03/27/2016   Malignant neoplasm of upper-outer quadrant of left breast in female, estrogen receptor positive ([place]) 09/11/2015   BMI 22.0-22.9, adult 03/14/2015   Metastasis to bone ([place]) 10/20/2014   Breast cancer metastasized to multiple sites ([place]) 08/28/2014   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  MEDICAL HISTORY: Past Medical History:  Diagnosis Date   Arthritis    Breast cancer ([place]) 1985/2016   takes [name] daily   Chronic back pain    stenosis/listhesis   Family history of ovarian cancer    Osteoporosis    takes Vit D   Radiation  11/23/14-12/11/14   30 Gy T1-T5     SURGICAL HISTORY: Past Surgical History:  Procedure Laterality Date   ABDOMINAL HYSTERECTOMY     1980   APPENDECTOMY  3295   APPLICATION OF INTRAOPERATIVE CT SCAN N/A 10/20/2014   Procedure: APPLICATION OF INTRAOPERATIVE CAT SCAN;  Surgeon: [name], MD;  Location: [place];  Service: Neurosurgery;  Laterality: N/A;   BREAST LUMPECTOMY WITH RADIOACTIVE SEED LOCALIZATION Left 05/03/2018   Procedure: LEFT BREAST LUMPECTOMY WITH BRACKETED RADIOACTIVE SEED LOCALIZATION;  Surgeon: [name], MD;  Location: [place];  Service: General;  Laterality: Left;   BREAST SURGERY Right 1985   THYROID CYST EXCISION  1967    SOCIAL HISTORY: Social History   Socioeconomic History   Marital status: Divorced    Spouse name: Not on file   Number of children: 1   Years of education: Not on file   Highest education level: Not on file  Occupational History  Not on file  Tobacco Use   Smoking status: Former    Packs/day: 0.50    Years: 10.00    Total pack years: 5.00    Types: Cigarettes    Quit date: 05/03/1970    Years since quitting: 51.8   Smokeless tobacco: Former   Tobacco comments:    quit smoking 1970's  Substance and Sexual Activity   Alcohol use: Yes    Alcohol/week: 0.0 standard drinks of alcohol    Comment: Rare   Drug use: No   Sexual activity: Not on file  Other Topics Concern   Not on file  Social History Narrative   Not on file   Social Determinants of Health   Financial Resource Strain: Not on file  Food Insecurity: Not on file  Transportation Needs: Not on file  Physical Activity: Not on file  Stress: Not on file  Social Connections: Not on file  Intimate Partner Violence: Not on file    FAMILY HISTORY: Family History  Problem Relation Age of Onset   Heart disease Mother    Hypertension Mother    Heart disease Father    Diabetes Father    Breast cancer Paternal Aunt        4 paternal aunts  with breast cancer over 39   Prostate cancer Paternal Uncle    Stroke Paternal Grandfather    Ovarian cancer Paternal Aunt    H[place]'s disease Other        Nephew, inherited from his father       PHYSICAL EXAMINATION  ECOG PERFORMANCE STATUS: 1 - Symptomatic but completely ambulatory  Vitals:   03/03/22 1144  BP: (!) 167/88  Pulse: (!) 105  Resp: 18  Temp: 98.1 F (36.7 C)  SpO2: 95%    Physical Exam Constitutional:      General: She is not in acute distress.    Appearance: Normal appearance. She is not toxic-appearing.  HENT:     Head: Normocephalic and atraumatic.  Eyes:     General: No scleral icterus. Cardiovascular:     Rate and Rhythm: Normal rate and regular rhythm.     Pulses: Normal pulses.     Heart sounds: Normal heart sounds.  Pulmonary:     Effort: Pulmonary effort is normal.     Breath sounds: Normal breath sounds.  Abdominal:     General: Abdomen is flat. Bowel sounds are normal. There is no distension.     Palpations: Abdomen is soft.     Tenderness: There is no abdominal tenderness.  Musculoskeletal:        General: Swelling (mild) present.     Cervical back: Neck supple.  Lymphadenopathy:     Cervical: No cervical adenopathy.  Skin:    General: Skin is warm and dry.     Findings: No rash.  Neurological:     General: No focal deficit present.     Mental Status: She is alert.  Psychiatric:        Mood and Affect: Mood normal.        Behavior: Behavior normal.     LABORATORY DATA:  CBC    Component Value Date/Time   WBC 5.2 03/03/2022 1135   WBC 4.5 11/14/2021 1150   RBC 4.32 03/03/2022 1135   HGB 14.3 03/03/2022 1135   HGB 15.1 05/29/2017 1417   HCT 40.5 03/03/2022 1135   HCT 45.1 05/29/2017 1417   PLT 258 03/03/2022 1135   PLT 262 05/29/2017 1417   MCV  93.8 03/03/2022 1135   MCV 91.7 05/29/2017 1417   MCH 33.1 03/03/2022 1135   MCHC 35.3 03/03/2022 1135   RDW 14.1 03/03/2022 1135   RDW 14.1 05/29/2017 1417    LYMPHSABS 0.9 03/03/2022 1135   LYMPHSABS 1.1 05/29/2017 1417   MONOABS 0.6 03/03/2022 1135   MONOABS 0.4 05/29/2017 1417   EOSABS 0.2 03/03/2022 1135   EOSABS 0.1 05/29/2017 1417   BASOSABS 0.1 03/03/2022 1135   BASOSABS 0.1 05/29/2017 1417    CMP     Component Value Date/Time   NA 143 03/03/2022 1135   NA 141 05/29/2017 1417   K 3.7 03/03/2022 1135   K 3.6 05/29/2017 1417   CL 108 03/03/2022 1135   CO2 30 03/03/2022 1135   CO2 27 05/29/2017 1417   GLUCOSE 114 (H) 03/03/2022 1135   GLUCOSE 111 05/29/2017 1417   BUN 9 03/03/2022 1135   BUN 17.0 05/29/2017 1417   CREATININE 0.56 03/03/2022 1135   CREATININE 0.66 03/05/2020 1218   CREATININE 0.8 05/29/2017 1417   CALCIUM 10.1 03/03/2022 1135   CALCIUM 10.7 (H) 06/26/2017 1216   CALCIUM 10.4 05/29/2017 1417   PROT 6.5 03/03/2022 1135   PROT 7.3 05/29/2017 1417   ALBUMIN 3.7 03/03/2022 1135   ALBUMIN 3.9 05/29/2017 1417   AST 22 03/03/2022 1135   AST 17 05/29/2017 1417   ALT 11 03/03/2022 1135   ALT 15 05/29/2017 1417   ALKPHOS 42 03/03/2022 1135   ALKPHOS 42 05/29/2017 1417   BILITOT 0.4 03/03/2022 1135   BILITOT 0.30 05/29/2017 1417   GFRNONAA >60 03/03/2022 1135   GFRNONAA 83 03/05/2020 1218   GFRAA 97 03/05/2020 1218     ASSESSMENT and THERAPY PLAN:   Malignant neoplasm of upper-outer quadrant of left breast in female, estrogen receptor positive ([place]) 81 y.o. [place] woman with stage IV left breast cancer involving liver, bone currently on Enhertu who is here for follow up. Please review history above. She continues to tolerate Enhertu very well except for some grade 1-2 fatigue and grade 1 constipation.  Her last imaging was concerning for progression in 1 area of the liver, rest of the disease appears to be stable.  This was however thought to be secondary to lapse in treatment hence we have resumed Enhertu and we will repeat imaging at the end of October.  If this is a solitary area of progression, we will  see if there can be any local modalities of treatment available to treat this area.  In the interim she will continue Enhertu as planned.  She appears to continue to tolerate it very well. No other physical examination findings.  She does have mild tachycardia and follows up with cardio oncology, no concerns at this time.  Return to clinic as scheduled   All questions were answered. The patient knows to call the clinic with any problems, questions or concerns. We can certainly see the patient much sooner if necessary.  Total encounter time:30 minutes*in face-to-face visit time, chart review, lab review, care coordination, order entry, and documentation of the encounter time.

## 2022-03-03 NOTE — Assessment & Plan Note (Signed)
81 y.o. [place] woman with stage IV left breast cancer involving liver, bone currently on Enhertu who is here for follow up. Please review history above. She continues to tolerate Enhertu very well except for some grade 1-2 fatigue and grade 1 constipation.  Her last imaging was concerning for progression in 1 area of the liver, rest of the disease appears to be stable.  This was however thought to be secondary to lapse in treatment hence we have resumed Enhertu and we will repeat imaging at the end of October.  If this is a solitary area of progression, we will see if there can be any local modalities of treatment available to treat this area.  In the interim she will continue Enhertu as planned.  She appears to continue to tolerate it very well. No other physical examination findings.  She does have mild tachycardia and follows up with cardio oncology, no concerns at this time.  Return to clinic as scheduled

## 2022-03-03 NOTE — Progress Notes (Signed)
[facility]:    [name], [name] [address]   DIAGNOSIS:  Cancer Staging  Breast cancer metastasized to multiple sites [name]) Staging form: Breast, AJCC 7th Edition - Clinical: Stage IV ([name], [name], M1) - Signed by Chauncey Cruel, MD on 08/31/2014  Malignant neoplasm of upper-outer quadrant of left breast in female, estrogen receptor positive ([name]) Staging form: Breast, AJCC 7th Edition - Clinical: Stage IV ([name], [place], M1) - Signed by Chauncey Cruel, MD on 09/11/2015   SUMMARY OF ONCOLOGIC HISTORY: Oncology History  Breast cancer metastasized to multiple sites [name])  08/28/2014 Initial Diagnosis   Breast cancer metastasized to multiple sites ([name])   01/28/2021 -  Chemotherapy   Patient is on Treatment Plan : BREAST METASTATIC Fam-Trastuzumab Deruxtecan-nxki (Enhertu) (5.4) q21d     Metastasis to bone ([name])  10/20/2014 Initial Diagnosis   Metastasis to bone ([name])   01/28/2021 -  Chemotherapy   Patient is on Treatment Plan : BREAST METASTATIC Fam-Trastuzumab Deruxtecan-nxki (Enhertu) (5.4) q21d     10/18/2021 Imaging   IMPRESSION: 1. Widespread metastatic disease to the liver appears generally stable compared to the prior study, with exception of a new lesion in segment 3 of the liver which measures 1.4 x 1.0 cm, as detailed above. 2. Cholelithiasis without evidence of acute cholecystitis at this time.     Malignant neoplasm of upper-outer quadrant of left breast in female, estrogen receptor positive ([name])  09/02/1983 Surgery   status post right lumpectomy and axillary node dissection in 1985 followed by radiation at [facility], exact date not clear.   08/16/2014 Imaging   METASTATIC DISEASE 08/16/2014: measurable disease in spine, lung and left breast   (2) evaluation for left shoulder pain led to thoracic spine MRI 08/16/2014 showing a pathologic fracture at T3 with epidural tumor displacing the cord to the  right, but no cord compression. CT scans of the chest, abdomen and pelvis 08/24/2014 showed in addition a mass in the upper outer quadrant left breast measuring 1.6 cm and a nodule in the minor fissure of the right lung measuring 1.2 cm, but no parenchymal lung or liver lesions.   (a) CA 27-29 was noninformative at 38 (09/20/2014)   08/29/2014 Mammogram   mammography and ultrasonography 08/29/2014 show a mass in the upper inner left breast which was palpable,  measuring 2.0 cm by ultrasound. Biopsy of this mass 08/29/2014 showed an invasive breast cancer with both lobular and ductal features, estrogen receptor positive, progesterone receptor weakly positive, with an MIB-1 in the 40% range, HER-2 equivocal (6 else ratio 1.5, but average number her nucleus 5.8    08/31/2014 - 05/29/2017 Anti-estrogen oral therapy   letrozole started 08/31/2014;   (a) palbociclib added Sept 2016 at 75 mg 21/7, with significant neutropenia; not repeated after first cycle  (b) letrozole discontinued 05/29/2017 with evidence of disease progression in the breast   10/20/2014 Surgery   10/20/2014 the patient underwent T2-T3 and T4 decompressive laminectomy with removal of epidural tumor, C7-T4 segmental pedicle screw instrumentation with virage screw system with arrow guidance protocol and C7-T4 posterolateral fusion. The cells were positive for the estrogen receptor. HER-2/neu testing by [name] showed again equivocal results,   11/23/2014 - 12/11/2014 Radiation Therapy   11/23/2014-12/11/2014.  (a) T1-T5 was treated to 30 Gy in 12 fractions at 2.5 Gy per fraction    09/11/2015 Initial Diagnosis   Malignant neoplasm of upper-outer quadrant of left breast in female, estrogen  receptor positive ([name])   05/29/2017 - 06/21/2018 Anti-estrogen oral therapy   fulvestrant started 05/29/2017, last dose 07/22/2018 (discontinued due to pandemic).  (a) started palbociclib 06/29/2017 at 75 mg every other day for 21 days on, 7 days off  (b)  palbociclib discontinued on 3/29 due to progressive fatigue and patient preference   05/04/2018 Surgery   status post left lumpectomy 05/03/2018 showing a pT1b pNX invasive ductal carcinoma, grade 2, with equivocal HER-2 results  (a) opted against adjuvant radiation given presence of stage IV disease   11/02/2018 PET scan   PET scan 11/02/2018 showed no active disease  (a) cerianna scan on 10/31/2019 shows activity in 2 liver lesions, no active bone or lung lesions  (b) abdominal ultrasound 11/08/2019 confirms 2 liver lesions measuring 4.4 and 2.1 cm.  (c) biopsy of 1 of the liver lesions 12/12/2019 confirmed metastatic breast cancer, estrogen and progesterone receptor positive, with an MIB-1 of 10%, and HER-2 positive, with a copy number of 2.12 and the number per cell 6.25  (d) abdominal ultrasound 03/12/2020 shows one of the liver lesions to have increased, while the second lesion has decreased   01/25/2019 - 12/02/2019 Anti-estrogen oral therapy    letrozole started 02/15/2019, discontinued 12/02/2019 with evidence of progression  (a) bone density 09/12/2016 showed a T score of -4.8.    12/08/2019 - 01/24/2021 Anti-estrogen oral therapy   fulvestrant resumed 12/08/2019  (a) palbociclib added at 75 mg daily 21/7 starting 12/20/2019   (b) fulvestrant and palbociclib discontinued September 2022 with evidence of progression.   04/25/2020 -  Antibody Plan   trastuzumab started 04/25/2020, repeat every 28 days  (a) echo 03/12/2020 shows an ejection fraction in the 60-65% range  (b) changed to subQ formulation of trastuzumab 06/20/2020  (c) trastuzumab discontinued September 2022 with MRI of the abdomen showing evidence of progression    Genetic Testing   genetics testing using the Breast/Ovarian Cancer Panel through [facility] [name], MD) found no deleterious mutations in ATM, BARD1, BRCA1, BRCA2, BRIP1, CDH1, CHEK2, EPCAM, FANCC, MLH1, MSH2, MSH6, NBN, PALB2, PMS2, PTEN, RAD51C, RAD51D, STK11,  TP53, or XRCC2      01/28/2021 -  Chemotherapy   Patient is on Treatment Plan : BREAST METASTATIC Fam-Trastuzumab Deruxtecan-nxki (Enhertu) (5.4) q21d     02/07/2021 -  Chemotherapy   Patient is on Treatment Plan :  BREAST METASTATIC fam-trastuzumab deruxtecan-nxki (Enhertu) q21d    starting Enhertu on 02/07/2021  (A) echo 11/21/2020 showed an ejection fraction in the 60-65% range  (B) Enhertu changed to every 4 weeks after the second dose to allow for additional recovery time  (C) treatment held after the third dose with significant drop in the patient's functional status  (D) restaging abdominal MRI 05/14/2021    04/25/2021 Imaging    acute DVT involving the right femoral vein and other proximal veins.  The left side was benign  (A) rivaroxaban started 04/25/2021, she stopped it 07/03/2021   07/17/2021 Imaging   MRI abdomen done 07/17/2021 showed multifocal hepatic metastatic disease with similar distribution when compared to prior study using diffusion-weighted images lesions are quite similar but with perhaps slight interval decrease size and some of the smaller lesions, no gross areas of new disease in the liver   10/18/2021 Imaging   IMPRESSION: 1. Widespread metastatic disease to the liver appears generally stable compared to the prior study, with exception of a new lesion in segment 3 of the liver which measures 1.4 x 1.0 cm, as detailed above. 2. Cholelithiasis  without evidence of acute cholecystitis at this time.     11/14/2021 Imaging   We tried to biopsy the lesion that appears worse on MRI, [place] however didn't show any metastatic lesion in the left lobe of the liver. Probable improvement in metastatic disease within the right lobe of the liver based on ultrasound measurements compared to the prior MRI last month. The largest central lesion shows clear decrease in size.     CURRENT THERAPY: Enhertu  INTERVAL HISTORY:  April Rosales 81 y.o. female returns for follow-up of  her metastatic breast cancer while receiving Enhertu.  She felt weak for almost 5/6 days after the infusion. No nausea, vomiting, fevers or chills. She feels constipated, no diarrhea. She denies any chest pain, SOB. No new neurological complaints.  Patient Active Problem List   Diagnosis Date Noted   Acute deep vein thrombosis (DVT) of right femoral vein ([facility]) 09/18/2021   Thoracic compression fracture ([place]) 05/15/2020   Closed compression fracture of body of L1 vertebra ([place]) 10/04/2019   Aortic atherosclerosis ([place]) 09/26/2019   Neuropathy 03/27/2016   Malignant neoplasm of upper-outer quadrant of left breast in female, estrogen receptor positive ([place]) 09/11/2015   BMI 22.0-22.9, adult 03/14/2015   Metastasis to bone ([place]) 10/20/2014   Breast cancer metastasized to multiple sites ([place]) 08/28/2014   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  MEDICAL HISTORY: Past Medical History:  Diagnosis Date   Arthritis    Breast cancer ([place]) 1985/2016   takes [name] daily   Chronic back pain    stenosis/listhesis   Family history of ovarian cancer    Osteoporosis    takes Vit D   Radiation 11/23/14-12/11/14   30 Gy T1-T5     SURGICAL HISTORY: Past Surgical History:  Procedure Laterality Date   ABDOMINAL HYSTERECTOMY     1980   APPENDECTOMY  4917   APPLICATION OF INTRAOPERATIVE CT SCAN N/A 10/20/2014   Procedure: APPLICATION OF INTRAOPERATIVE CAT SCAN;  Surgeon: [name], MD;  Location: [facility];  Service: Neurosurgery;  Laterality: N/A;   BREAST LUMPECTOMY WITH RADIOACTIVE SEED LOCALIZATION Left 05/03/2018    Procedure: LEFT BREAST LUMPECTOMY WITH BRACKETED RADIOACTIVE SEED LOCALIZATION;  Surgeon: [name], MD;  Location: [facility];  Service: General;  Laterality: Left;   BREAST SURGERY Right 1985   THYROID CYST EXCISION  1967    SOCIAL HISTORY: Social History   Socioeconomic History   Marital status: Divorced    Spouse name: Not on file   Number of children: 1   Years of education: Not on file   Highest education level: Not on file  Occupational History   Not on file  Tobacco Use   Smoking status: Former    Packs/day: 0.50    Years: 10.00    Total pack years: 5.00    Types: Cigarettes    Quit date: 05/03/1970    Years since quitting: 51.8   Smokeless tobacco: Former   Tobacco comments:    quit smoking 1970's  Substance and Sexual Activity   Alcohol use: Yes    Alcohol/week: 0.0 standard drinks of alcohol  Comment: Rare   Drug use: No   Sexual activity: Not on file  Other Topics Concern   Not on file  Social History Narrative   Not on file   Social Determinants of Health   Financial Resource Strain: Not on file  Food Insecurity: Not on file  Transportation Needs: Not on file  Physical Activity: Not on file  Stress: Not on file  Social Connections: Not on file  Intimate Partner Violence: Not on file    FAMILY HISTORY: Family History  Problem Relation Age of Onset   Heart disease Mother    Hypertension Mother    Heart disease Father    Diabetes Father    Breast cancer Paternal Aunt        4 paternal aunts with breast cancer over 18   Prostate cancer Paternal Uncle    Stroke Paternal Grandfather    Ovarian cancer Paternal Aunt    H[place]'s disease Other        Nephew, inherited from his father       PHYSICAL EXAMINATION  ECOG PERFORMANCE STATUS: 1 - Symptomatic but completely ambulatory  Vitals:   03/03/22 1144  BP: (!) 167/88  Pulse: (!) 105  Resp: 18  Temp: 98.1 F (36.7 C)  SpO2: 95%    Physical  Exam Constitutional:      General: She is not in acute distress.    Appearance: Normal appearance. She is not toxic-appearing.  HENT:     Head: Normocephalic and atraumatic.  Eyes:     General: No scleral icterus. Cardiovascular:     Rate and Rhythm: Normal rate and regular rhythm.     Pulses: Normal pulses.     Heart sounds: Normal heart sounds.  Pulmonary:     Effort: Pulmonary effort is normal.     Breath sounds: Normal breath sounds.  Abdominal:     General: Abdomen is flat. Bowel sounds are normal. There is no distension.     Palpations: Abdomen is soft.     Tenderness: There is no abdominal tenderness.  Musculoskeletal:        General: Swelling (No change overall) present.     Cervical back: Neck supple.  Lymphadenopathy:     Cervical: No cervical adenopathy.  Skin:    General: Skin is warm and dry.     Findings: No rash.  Neurological:     General: No focal deficit present.     Mental Status: She is alert.  Psychiatric:        Mood and Affect: Mood normal.        Behavior: Behavior normal.     LABORATORY DATA:  CBC    Component Value Date/Time   WBC 5.2 03/03/2022 1135   WBC 4.5 11/14/2021 1150   RBC 4.32 03/03/2022 1135   HGB 14.3 03/03/2022 1135   HGB 15.1 05/29/2017 1417   HCT 40.5 03/03/2022 1135   HCT 45.1 05/29/2017 1417   PLT 258 03/03/2022 1135   PLT 262 05/29/2017 1417   MCV 93.8 03/03/2022 1135   MCV 91.7 05/29/2017 1417   MCH 33.1 03/03/2022 1135   MCHC 35.3 03/03/2022 1135   RDW 14.1 03/03/2022 1135   RDW 14.1 05/29/2017 1417   LYMPHSABS 0.9 03/03/2022 1135   LYMPHSABS 1.1 05/29/2017 1417   MONOABS 0.6 03/03/2022 1135   MONOABS 0.4 05/29/2017 1417   EOSABS 0.2 03/03/2022 1135   EOSABS 0.1 05/29/2017 1417   BASOSABS 0.1 03/03/2022 1135   BASOSABS 0.1 05/29/2017 1417  CMP     Component Value Date/Time   NA 143 03/03/2022 1135   NA 141 05/29/2017 1417   K 3.7 03/03/2022 1135   K 3.6 05/29/2017 1417   CL 108 03/03/2022 1135    CO2 30 03/03/2022 1135   CO2 27 05/29/2017 1417   GLUCOSE 114 (H) 03/03/2022 1135   GLUCOSE 111 05/29/2017 1417   BUN 9 03/03/2022 1135   BUN 17.0 05/29/2017 1417   CREATININE 0.56 03/03/2022 1135   CREATININE 0.66 03/05/2020 1218   CREATININE 0.8 05/29/2017 1417   CALCIUM 10.1 03/03/2022 1135   CALCIUM 10.7 (H) 06/26/2017 1216   CALCIUM 10.4 05/29/2017 1417   PROT 6.5 03/03/2022 1135   PROT 7.3 05/29/2017 1417   ALBUMIN 3.7 03/03/2022 1135   ALBUMIN 3.9 05/29/2017 1417   AST 22 03/03/2022 1135   AST 17 05/29/2017 1417   ALT 11 03/03/2022 1135   ALT 15 05/29/2017 1417   ALKPHOS 42 03/03/2022 1135   ALKPHOS 42 05/29/2017 1417   BILITOT 0.4 03/03/2022 1135   BILITOT 0.30 05/29/2017 1417   GFRNONAA >60 03/03/2022 1135   GFRNONAA 83 03/05/2020 1218   GFRAA 97 03/05/2020 1218     ASSESSMENT and THERAPY PLAN:   Malignant neoplasm of upper-outer quadrant of left breast in female, estrogen receptor positive ([place]) 81 y.o. [place] woman with stage IV left breast cancer involving liver, bone currently on Enhertu who is here for follow up. Please review history above. She continues to tolerate Enhertu very well except for some grade 1-2 fatigue and grade 1 constipation.  Her last imaging was concerning for progression in 1 area of the liver, rest of the disease appears to be stable.  This was however thought to be secondary to lapse in treatment hence we have resumed Enhertu and we will repeat imaging at the end of October.  If this is a solitary area of progression, we will see if there can be any local modalities of treatment available to treat this area.  In the interim she will continue Enhertu as planned.  She appears to continue to tolerate it very well. No other physical examination findings.  She does have mild tachycardia and follows up with cardio oncology, no concerns at this time.  Return to clinic as scheduled   All questions were answered. The patient knows to call the  clinic with any problems, questions or concerns. We can certainly see the patient much sooner if necessary.  Total encounter time:40 minutes*in face-to-face visit time, chart review, lab review, care coordination, order entry, and documentation of the encounter time.   *Total Encounter Time as defined by the Centers for Medicare and Medicaid Services includes, in addition to the face-to-face time of a patient visit (documented in the note above) non-face-to-face time: obtaining and reviewing outside history, ordering and reviewing medications, tests or procedures, care coordination (communications with other health care professionals or caregivers) and documentation in the medical record.

## 2022-03-03 NOTE — Patient Instructions (Signed)
[facility] MEDICAL ONCOLOGY  Discharge Instructions: Thank you for choosing [facility] to provide your oncology and hematology care.   If you have a lab appointment with the Cancer Center, please go directly to the Cancer Center and check in at the registration area.   Wear comfortable clothing and clothing appropriate for easy access to any Portacath or PICC line.   We strive to give you quality time with your provider. You may need to reschedule your appointment if you arrive late (15 or more minutes).  Arriving late affects you and other patients whose appointments are after yours.  Also, if you miss three or more appointments without notifying the office, you may be dismissed from the clinic at the provider's discretion.      For prescription refill requests, have your pharmacy contact our office and allow 72 hours for refills to be completed.    Today you received the following chemotherapy and/or immunotherapy agents: fam-trastuzumab (Enhertu)      To help prevent nausea and vomiting after your treatment, we encourage you to take your nausea medication as directed.  BELOW ARE SYMPTOMS THAT SHOULD BE REPORTED IMMEDIATELY: *FEVER GREATER THAN 100.4 F (38 C) OR HIGHER *CHILLS OR SWEATING *NAUSEA AND VOMITING THAT IS NOT CONTROLLED WITH YOUR NAUSEA MEDICATION *UNUSUAL SHORTNESS OF BREATH *UNUSUAL BRUISING OR BLEEDING *URINARY PROBLEMS (pain or burning when urinating, or frequent urination) *BOWEL PROBLEMS (unusual diarrhea, constipation, pain near the anus) TENDERNESS IN MOUTH AND THROAT WITH OR WITHOUT PRESENCE OF ULCERS (sore throat, sores in mouth, or a toothache) UNUSUAL RASH, SWELLING OR PAIN  UNUSUAL VAGINAL DISCHARGE OR ITCHING   Items with * indicate a potential emergency and should be followed up as soon as possible or go to the Emergency Department if any problems should occur.  Please show the CHEMOTHERAPY ALERT CARD or IMMUNOTHERAPY ALERT CARD  at check-in to the Emergency Department and triage nurse.  Should you have questions after your visit or need to cancel or reschedule your appointment, please contact [facility]  Dept: [phone]  and follow the prompts.  Office hours are 8:00 a.m. to 4:30 p.m. Monday - Friday. Please note that voicemails left after 4:00 p.m. may not be returned until the following business day.  We are closed weekends and major holidays. You have access to a nurse at all times for urgent questions. Please call the main number to the clinic Dept: [phone] and follow the prompts.   For any non-urgent questions, you may also contact your provider using MyChart. We now offer e-Visits for anyone 18 and older to request care online for non-urgent symptoms. For details visit [email or web address].   Also download the MyChart app! Go to the app store, search "MyChart", open the app, select [facility], and log in with your MyChart username and password.  Masks are optional in the cancer centers. If you would like for your care team to wear a mask while they are taking care of you, please let them know. You may have one support person who is at least 82 years old accompany you for your appointments. 

## 2022-03-04 ENCOUNTER — Other Ambulatory Visit: Payer: Self-pay

## 2022-03-05 ENCOUNTER — Other Ambulatory Visit: Payer: Self-pay

## 2022-03-06 ENCOUNTER — Ambulatory Visit (HOSPITAL_COMMUNITY)
Admission: RE | Admit: 2022-03-06 | Discharge: 2022-03-06 | Disposition: A | Discharge status: Home / Self Care | Payer: Medicare Other | Source: Ambulatory Visit | Attending: Internal Medicine | Admitting: Internal Medicine

## 2022-03-06 ENCOUNTER — Encounter (HOSPITAL_COMMUNITY): Payer: Self-pay | Admitting: Internal Medicine

## 2022-03-06 VITALS — BP 140/88 | HR 103

## 2022-03-06 DIAGNOSIS — C7800 Secondary malignant neoplasm of unspecified lung: Secondary | ICD-10-CM | POA: Diagnosis not present

## 2022-03-06 DIAGNOSIS — C50412 Malignant neoplasm of upper-outer quadrant of left female breast: Secondary | ICD-10-CM

## 2022-03-06 DIAGNOSIS — Z803 Family history of malignant neoplasm of breast: Secondary | ICD-10-CM | POA: Diagnosis not present

## 2022-03-06 DIAGNOSIS — C50919 Malignant neoplasm of unspecified site of unspecified female breast: Secondary | ICD-10-CM | POA: Insufficient documentation

## 2022-03-06 DIAGNOSIS — Z853 Personal history of malignant neoplasm of breast: Secondary | ICD-10-CM | POA: Diagnosis present

## 2022-03-06 DIAGNOSIS — I10 Essential (primary) hypertension: Secondary | ICD-10-CM

## 2022-03-06 DIAGNOSIS — Z87891 Personal history of nicotine dependence: Secondary | ICD-10-CM | POA: Insufficient documentation

## 2022-03-06 DIAGNOSIS — R000 Tachycardia, unspecified: Secondary | ICD-10-CM | POA: Diagnosis not present

## 2022-03-06 DIAGNOSIS — Z170 Estrogen receptor positive status [ER+]: Secondary | ICD-10-CM | POA: Diagnosis not present

## 2022-03-06 NOTE — Addendum Note (Signed)
Encounter addended by: Jerl Mina, RN on: 03/06/2022 1:00 PM  Actions taken: Visit diagnoses modified, Clinical Note Signed, Order list changed, Diagnosis association updated

## 2022-03-06 NOTE — Progress Notes (Addendum)
CARDIO-ONCOLOGY CLINIC NOTE  Referring Physician: Dr. Chryl Heck  Primary Care: [name], MD Primary Cardiologist: None  HPI:  Ms. April Rosales is an 81 y.o. female with HTN, goiter (s/p surgical resection),  stage IV breast cancer referred by Dr. Chryl Heck for enrollment into the Cardio-Oncology program.  Initially diagnosed with breast cancer in 1985. Underwent lumpectomy and XRT at [facility] to have Stage IV breast CA in 2016 with mets to spine, lung and left breast. Started on letrozole in 4/16. Palbociclib added 9/16. Also had XRT to spine.  Stopped after 1 dose due to neutropenia. Letrozole stopped in 2019 d/t disease progression   Fulvestrant started 1/19 -> 2/20 stopped due to Covid pandemic. Palbociclib restarted 2/19 and stopped 3/19    12/19 undewent left lumpectomy   9/20 letrozole restarted -> stopped 7/21 due to progression   7/21 fluvestrant/palbociclib restarted 7/21 -> stopped 9/22 due to progression  12/21 Herceptin started 12/21 -> 9/22 stopped d/t progression   9/22 started enhertu   Echo 6/22 EF 60-65%  Echo 6/23 EF 55-60% Echo 01/30/22 EF 50-55% (I felt 55-60%)   I reviewed vitals from previous visits SBP 150-160 since 12/22 HR has been 90-105 for nearly 2 years   Here for f/u on her tachycardia. At last visit we started low-dose carvedilol and she stopped after 6 doses because she said it affected her vision and she couldn't see things well. Stopped carvedilol and problem went away. The subsequently cut her dose of Kadcyla some and now HR back down < 100.    Past Medical History:  Diagnosis Date   Arthritis    Breast cancer ([place]) 1985/2016   takes [name] daily   Chronic back pain    stenosis/listhesis   Family history of ovarian cancer    Osteoporosis    takes Vit D   Radiation 11/23/14-12/11/14   30 Gy T1-T5     Current Outpatient Medications  Medication Sig Dispense Refill   Cholecalciferol (VITAMIN D3 ADULT GUMMIES PO) Take 2,000 Units by  mouth daily.      Cyanocobalamin (VITAMIN B 12 PO) Take 1 tablet by mouth every other day.     diazepam (VALIUM) 5 MG tablet Take 1 tablet (5 mg total) by mouth every 6 (six) hours as needed for anxiety. 20 tablet 0   Iron-Vitamins (GERITOL PO) Take by mouth daily.     OVER THE COUNTER MEDICATION OTC Apple Cider Vinegar 1 capsule daily.     traMADol (ULTRAM) 50 MG tablet Take 1 tablet (50 mg total) by mouth every 8 (eight) hours as needed. 30 tablet 0   No current facility-administered medications for this encounter.    Allergies  Allergen Reactions   Ativan [Lorazepam]     Made her crazy   Dilaudid [Hydromorphone Hcl]     Doesn't want   Haldol [Haloperidol Lactate]     Made her crazy   Xarelto [Rivaroxaban]     Pt reports debilitating dizziness.      Social History   Socioeconomic History   Marital status: Divorced    Spouse name: Not on file   Number of children: 1   Years of education: Not on file   Highest education level: Not on file  Occupational History   Not on file  Tobacco Use   Smoking status: Former    Packs/day: 0.50    Years: 10.00    Total pack years: 5.00    Types: Cigarettes    Quit date: 05/03/1970  Years since quitting: 51.8   Smokeless tobacco: Former   Tobacco comments:    quit smoking 1970's  Substance and Sexual Activity   Alcohol use: Yes    Alcohol/week: 0.0 standard drinks of alcohol    Comment: Rare   Drug use: No   Sexual activity: Not on file  Other Topics Concern   Not on file  Social History Narrative   Not on file   Social Determinants of Health   Financial Resource Strain: Not on file  Food Insecurity: Not on file  Transportation Needs: Not on file  Physical Activity: Not on file  Stress: Not on file  Social Connections: Not on file  Intimate Partner Violence: Not on file      Family History  Problem Relation Age of Onset   Heart disease Mother    Hypertension Mother    Heart disease Father    Diabetes Father     Breast cancer Paternal Aunt        4 paternal aunts with breast cancer over 36   Prostate cancer Paternal Uncle    Stroke Paternal Grandfather    Ovarian cancer Paternal Aunt    Huntington's disease Other        Nephew, inherited from his father    Vitals:   03/06/22 1207  BP: (!) 140/88  Pulse: (!) 103  SpO2: 97%    PHYSICAL EXAM: General:  Elderly frail appearing. No respiratory difficulty HEENT: normal Neck: supple. no JVD. Carotids 2+ bilat; no bruits. No lymphadenopathy or thryomegaly appreciated. Cor: PMI nondisplaced. Regular rate & rhythm. No rubs, gallops or murmurs. Lungs: clear Abdomen: soft, nontender, nondistended. No hepatosplenomegaly. No bruits or masses. Good bowel sounds. Extremities: no cyanosis, clubbing, rash, edema Neuro: alert & orientedx3, cranial nerves grossly intact. moves all 4 extremities w/o difficulty. Affect pleasant  ECG: Sinus tach 98 No ST-T wave abnormalities.    ASSESSMENT & PLAN:  1. Stage IV Breast Cancer - Explained incidence of cardiotoxicity with various agents she has ahd and role of Cardio-oncology clinic at length. Echo images previously reviewed personally. All parameters stable. I do not see any evidence of cardiotoxicity.  - repeat echo every 3 months for surveillance while on enhertu  2. Sinus tachycardia - unclear etiology - in reviewing flowsheets has been present for nearly 2 years but worse lately.  - thyroid and hgb ok. - HR lower today with cutting back Enhertu dose  - Zio patch 9/23 shows SR with average HR 83 bpm which is reassurring - Would continue to follow  3. HTN - has been elevated > 2 years - ok to tolerate SBP up to 150   Glori Bickers, MD  12:42 PM

## 2022-03-06 NOTE — Patient Instructions (Signed)
There has been no changes to your medications.  Your physician has requested that you have an echocardiogram. Echocardiography is a painless test that uses sound waves to create images of your heart. It provides your doctor with information about the size and shape of your heart and how well your heart's chambers and valves are working. This procedure takes approximately one hour. There are no restrictions for this procedure.  Your physician recommends that you schedule a follow-up appointment in: 2 months  If you have any questions or concerns before your next appointment please send [place] a message through [facility] or call our office at [phone].    TO LEAVE A MESSAGE FOR THE NURSE SELECT OPTION 2, PLEASE LEAVE A MESSAGE INCLUDING: YOUR NAME DATE OF BIRTH CALL BACK NUMBER REASON FOR CALL**this is important as we prioritize the call backs  YOU WILL RECEIVE A CALL BACK THE SAME DAY AS LONG AS YOU CALL BEFORE 4:00 PM  At the [facility], you and your health needs are our priority. As part of our continuing mission to provide you with exceptional heart care, we have created designated Provider Care Teams. These Care Teams include your primary Cardiologist (physician) and Advanced Practice Providers (APPs- Physician Assistants and Nurse Practitioners) who all work together to provide you with the care you need, when you need it.   You may see any of the following providers on your designated Care Team at your next follow up: Dr Glori Bickers Dr Loralie Champagne Dr. Roxana Hires, NP Lyda Jester, [place] [facility] [place], [place] Forestine Na, NP Audry Riles, PharmD   Please be sure to bring in all your medications bottles to every appointment.

## 2022-03-07 ENCOUNTER — Other Ambulatory Visit: Payer: Self-pay

## 2022-03-20 ENCOUNTER — Encounter: Payer: Self-pay | Admitting: Hematology and Oncology

## 2022-03-21 ENCOUNTER — Ambulatory Visit (HOSPITAL_COMMUNITY)
Admission: RE | Admit: 2022-03-21 | Discharge: 2022-03-21 | Disposition: A | Discharge status: Home / Self Care | Payer: Medicare Other | Source: Ambulatory Visit | Attending: Hematology and Oncology | Admitting: Hematology and Oncology

## 2022-03-21 DIAGNOSIS — K769 Liver disease, unspecified: Secondary | ICD-10-CM | POA: Diagnosis not present

## 2022-03-21 DIAGNOSIS — J479 Bronchiectasis, uncomplicated: Secondary | ICD-10-CM | POA: Diagnosis not present

## 2022-03-21 DIAGNOSIS — J9811 Atelectasis: Secondary | ICD-10-CM | POA: Diagnosis not present

## 2022-03-21 DIAGNOSIS — K8020 Calculus of gallbladder without cholecystitis without obstruction: Secondary | ICD-10-CM | POA: Diagnosis not present

## 2022-03-21 DIAGNOSIS — Z8505 Personal history of malignant neoplasm of liver: Secondary | ICD-10-CM | POA: Diagnosis not present

## 2022-03-21 DIAGNOSIS — C50919 Malignant neoplasm of unspecified site of unspecified female breast: Secondary | ICD-10-CM

## 2022-03-21 DIAGNOSIS — C7951 Secondary malignant neoplasm of bone: Secondary | ICD-10-CM | POA: Diagnosis not present

## 2022-03-21 DIAGNOSIS — C50412 Malignant neoplasm of upper-outer quadrant of left female breast: Secondary | ICD-10-CM | POA: Diagnosis not present

## 2022-03-21 DIAGNOSIS — Z170 Estrogen receptor positive status [ER+]: Secondary | ICD-10-CM | POA: Diagnosis not present

## 2022-03-21 DIAGNOSIS — C787 Secondary malignant neoplasm of liver and intrahepatic bile duct: Secondary | ICD-10-CM | POA: Diagnosis not present

## 2022-03-21 DIAGNOSIS — R918 Other nonspecific abnormal finding of lung field: Secondary | ICD-10-CM | POA: Diagnosis not present

## 2022-03-21 MED ORDER — IOHEXOL 300 MG/ML  SOLN
80.0000 mL | Freq: Once | INTRAMUSCULAR | Status: AC | PRN
  Administered 2022-03-21: 80 mL via INTRAVENOUS

## 2022-03-21 MED ORDER — SODIUM CHLORIDE (PF) 0.9 % IJ SOLN
INTRAMUSCULAR | Status: AC
  Filled 2022-03-21: qty 50

## 2022-03-21 MED FILL — Dexamethasone Sodium Phosphate Inj 100 MG/10ML: INTRAMUSCULAR | Qty: 1 | Status: AC

## 2022-03-24 ENCOUNTER — Inpatient Hospital Stay (HOSPITAL_BASED_OUTPATIENT_CLINIC_OR_DEPARTMENT_OTHER): Payer: Medicare Other | Admitting: Adult Health

## 2022-03-24 ENCOUNTER — Encounter: Payer: Self-pay | Admitting: Hematology and Oncology

## 2022-03-24 ENCOUNTER — Other Ambulatory Visit: Payer: Self-pay

## 2022-03-24 ENCOUNTER — Encounter: Payer: Self-pay | Admitting: Adult Health

## 2022-03-24 ENCOUNTER — Inpatient Hospital Stay: Payer: Medicare Other

## 2022-03-24 VITALS — BP 157/81 | HR 109 | Temp 98.8°F | Resp 16 | Ht 61.0 in | Wt 105.4 lb

## 2022-03-24 VITALS — HR 94

## 2022-03-24 DIAGNOSIS — C50919 Malignant neoplasm of unspecified site of unspecified female breast: Secondary | ICD-10-CM

## 2022-03-24 DIAGNOSIS — C7951 Secondary malignant neoplasm of bone: Secondary | ICD-10-CM

## 2022-03-24 DIAGNOSIS — Z5112 Encounter for antineoplastic immunotherapy: Secondary | ICD-10-CM | POA: Diagnosis not present

## 2022-03-24 DIAGNOSIS — C50412 Malignant neoplasm of upper-outer quadrant of left female breast: Secondary | ICD-10-CM | POA: Diagnosis not present

## 2022-03-24 DIAGNOSIS — Z170 Estrogen receptor positive status [ER+]: Secondary | ICD-10-CM

## 2022-03-24 DIAGNOSIS — C787 Secondary malignant neoplasm of liver and intrahepatic bile duct: Secondary | ICD-10-CM | POA: Diagnosis not present

## 2022-03-24 LAB — CMP (CANCER CENTER ONLY)
ALT: 11 U/L (ref 0–44)
AST: 20 U/L (ref 15–41)
Albumin: 3.5 g/dL (ref 3.5–5.0)
Alkaline Phosphatase: 39 U/L (ref 38–126)
Anion gap: 4 — ABNORMAL LOW (ref 5–15)
BUN: 8 mg/dL (ref 8–23)
CO2: 30 mmol/L (ref 22–32)
Calcium: 9.8 mg/dL (ref 8.9–10.3)
Chloride: 106 mmol/L (ref 98–111)
Creatinine: 0.57 mg/dL (ref 0.44–1.00)
GFR, Estimated: >60 mL/min (ref >60)
Glucose, Bld: 130 mg/dL — ABNORMAL HIGH (ref 70–99)
Potassium: 3.3 mmol/L — ABNORMAL LOW (ref 3.5–5.1)
Sodium: 140 mmol/L (ref 135–145)
Total Bilirubin: 0.4 mg/dL (ref 0.3–1.2)
Total Protein: 6.3 g/dL — ABNORMAL LOW (ref 6.5–8.1)

## 2022-03-24 LAB — CBC WITH DIFFERENTIAL (CANCER CENTER ONLY)
Abs Immature Granulocytes: 0.02 10*3/uL (ref 0.00–0.07)
Basophils Absolute: 0 10*3/uL (ref 0.0–0.1)
Basophils Relative: 1 %
Eosinophils Absolute: 0.2 10*3/uL (ref 0.0–0.5)
Eosinophils Relative: 4 %
HCT: 39.9 % (ref 36.0–46.0)
Hemoglobin: 13.9 g/dL (ref 12.0–15.0)
Immature Granulocytes: 0 %
Lymphocytes Relative: 21 %
Lymphs Abs: 1.1 10*3/uL (ref 0.7–4.0)
MCH: 32.4 pg (ref 26.0–34.0)
MCHC: 34.8 g/dL (ref 30.0–36.0)
MCV: 93 fL (ref 80.0–100.0)
Monocytes Absolute: 0.5 10*3/uL (ref 0.1–1.0)
Monocytes Relative: 11 %
Neutro Abs: 3.2 10*3/uL (ref 1.7–7.7)
Neutrophils Relative %: 63 %
Platelet Count: 271 10*3/uL (ref 150–400)
RBC: 4.29 MIL/uL (ref 3.87–5.11)
RDW: 14.6 % (ref 11.5–15.5)
WBC Count: 5.1 10*3/uL (ref 4.0–10.5)
nRBC: 0 % (ref 0.0–0.2)

## 2022-03-24 MED ORDER — SODIUM CHLORIDE 0.9 % IV SOLN
150.0000 mg | Freq: Once | INTRAVENOUS | Status: AC
  Administered 2022-03-24: 150 mg via INTRAVENOUS
  Filled 2022-03-24: qty 150

## 2022-03-24 MED ORDER — DEXTROSE 5 % IV SOLN: Freq: Once | INTRAVENOUS | Status: AC

## 2022-03-24 MED ORDER — PALONOSETRON HCL INJECTION 0.25 MG/5ML
0.2500 mg | Freq: Once | INTRAVENOUS | Status: AC
  Administered 2022-03-24: 0.25 mg via INTRAVENOUS
  Filled 2022-03-24: qty 5

## 2022-03-24 MED ORDER — SODIUM CHLORIDE 0.9% FLUSH: 10.0000 mL | INTRAVENOUS | Status: DC | PRN

## 2022-03-24 MED ORDER — SODIUM CHLORIDE 0.9 % IV SOLN
10.0000 mg | Freq: Once | INTRAVENOUS | Status: AC
  Administered 2022-03-24: 10 mg via INTRAVENOUS
  Filled 2022-03-24: qty 10

## 2022-03-24 MED ORDER — DIPHENHYDRAMINE HCL 25 MG PO CAPS
50.0000 mg | ORAL_CAPSULE | Freq: Once | ORAL | Status: AC
  Administered 2022-03-24: 50 mg via ORAL
  Filled 2022-03-24: qty 2

## 2022-03-24 MED ORDER — ACETAMINOPHEN 325 MG PO TABS
650.0000 mg | ORAL_TABLET | Freq: Once | ORAL | Status: AC
  Administered 2022-03-24: 650 mg via ORAL
  Filled 2022-03-24: qty 2

## 2022-03-24 MED ORDER — FAM-TRASTUZUMAB DERUXTECAN-NXKI CHEMO 100 MG IV SOLR
3.2000 mg/kg | Freq: Once | INTRAVENOUS | Status: AC
  Administered 2022-03-24: 158 mg via INTRAVENOUS
  Filled 2022-03-24: qty 7.9

## 2022-03-24 NOTE — Assessment & Plan Note (Addendum)
April Rosales is a 81 year old woman with metastatic breast cancer currently on treatment with Enhertu.  Her most recent restaging showed an mixed response to treatment with and increasing targetoid lesion in the right liver lobe.  I reviewed her CT scan findings with her in detail after April Rosales and I discussed them thoroughly.  It is unclear what is going on in the right lobe of the liver and a biopsy would be helpful to understand along with the possible ablation.  I have reached out to April Rosales in interventional radiology who is on-call today to talk to him about this option for the patient.  April Rosales will proceed with Enhertu today.  We will let her know once we hear from radiology there opinions on her imaging and next steps.  She will continue to follow-up with April Rosales about her heart and for her echocardiograms.  Will return in 3 weeks for labs, follow-up, and her next Enhertu treatment.

## 2022-03-24 NOTE — Patient Instructions (Signed)
[facility]  Discharge Instructions: Thank you for choosing [facility] to provide your oncology and hematology care.   If you have a lab appointment with the [facility], please go directly to the [facility] and check in at the registration area.   Wear comfortable clothing and clothing appropriate for easy access to any Portacath or PICC line.   We strive to give you quality time with your provider. You may need to reschedule your appointment if you arrive late (15 or more minutes).  Arriving late affects you and other patients whose appointments are after yours.  Also, if you miss three or more appointments without notifying the office, you may be dismissed from the clinic at the provider's discretion.      For prescription refill requests, have your pharmacy contact our office and allow 72 hours for refills to be completed.    Today you received the following chemotherapy and/or immunotherapy agents: fam-trastuzumab (Enhertu)      To help prevent nausea and vomiting after your treatment, we encourage you to take your nausea medication as directed.  BELOW ARE SYMPTOMS THAT SHOULD BE REPORTED IMMEDIATELY: *FEVER GREATER THAN 100.4 F (38 C) OR HIGHER *CHILLS OR SWEATING *NAUSEA AND VOMITING THAT IS NOT CONTROLLED WITH YOUR NAUSEA MEDICATION *UNUSUAL SHORTNESS OF BREATH *UNUSUAL BRUISING OR BLEEDING *URINARY PROBLEMS (pain or burning when urinating, or frequent urination) *BOWEL PROBLEMS (unusual diarrhea, constipation, pain near the anus) TENDERNESS IN MOUTH AND THROAT WITH OR WITHOUT PRESENCE OF ULCERS (sore throat, sores in mouth, or a toothache) UNUSUAL RASH, SWELLING OR PAIN  UNUSUAL VAGINAL DISCHARGE OR ITCHING   Items with * indicate a potential emergency and should be followed up as soon as possible or go to the Emergency Department if any problems should occur.  Please show the CHEMOTHERAPY ALERT CARD or IMMUNOTHERAPY ALERT CARD  at check-in to the Emergency Department and triage nurse.  Should you have questions after your visit or need to cancel or reschedule your appointment, please contact [facility]  Dept: [phone]  and follow the prompts.  Office hours are 8:00 a.m. to 4:30 p.m. Monday - Friday. Please note that voicemails left after 4:00 p.m. may not be returned until the following business day.  We are closed weekends and major holidays. You have access to a nurse at all times for urgent questions. Please call the main number to the clinic Dept: [phone] and follow the prompts.   For any non-urgent questions, you may also contact your provider using MyChart. We now offer e-Visits for anyone 28 and older to request care online for non-urgent symptoms. For details visit [email or web address].   Also download the MyChart app! Go to the app store, search "MyChart", open the app, select [facility], and log in with your MyChart username and password.  Masks are optional in the cancer centers. If you would like for your care team to wear a mask while they are taking care of you, please let them know. You may have one support person who is at least 82 years old accompany you for your appointments.

## 2022-03-24 NOTE — Progress Notes (Signed)
[facility]:    [name], [name] [address]   DIAGNOSIS:  Cancer Staging  Breast cancer metastasized to multiple sites [name]) Staging form: Breast, AJCC 7th Edition - Clinical: Stage IV ([name], [name], M1) - Signed by Chauncey Cruel, MD on 08/31/2014  Malignant neoplasm of upper-outer quadrant of left breast in female, estrogen receptor positive ([name]) Staging form: Breast, AJCC 7th Edition - Clinical: Stage IV ([name], [place], M1) - Signed by Chauncey Cruel, MD on 09/11/2015   SUMMARY OF ONCOLOGIC HISTORY: Oncology History  Breast cancer metastasized to multiple sites [name])  08/28/2014 Initial Diagnosis   Breast cancer metastasized to multiple sites ([name])   01/28/2021 -  Chemotherapy   Patient is on Treatment Plan : BREAST METASTATIC Fam-Trastuzumab Deruxtecan-nxki (Enhertu) (5.4) q21d     Metastasis to bone ([name])  10/20/2014 Initial Diagnosis   Metastasis to bone ([name])   01/28/2021 -  Chemotherapy   Patient is on Treatment Plan : BREAST METASTATIC Fam-Trastuzumab Deruxtecan-nxki (Enhertu) (5.4) q21d     10/18/2021 Imaging   IMPRESSION: 1. Widespread metastatic disease to the liver appears generally stable compared to the prior study, with exception of a new lesion in segment 3 of the liver which measures 1.4 x 1.0 cm, as detailed above. 2. Cholelithiasis without evidence of acute cholecystitis at this time.     Malignant neoplasm of upper-outer quadrant of left breast in female, estrogen receptor positive ([name])  09/02/1983 Surgery   status post right lumpectomy and axillary node dissection in 1985 followed by radiation at [facility], exact date not clear.   08/16/2014 Imaging   METASTATIC DISEASE 08/16/2014: measurable disease in spine, lung and left breast   (2) evaluation for left shoulder pain led to thoracic spine MRI 08/16/2014 showing a pathologic fracture at T3 with epidural tumor displacing the cord to the  right, but no cord compression. CT scans of the chest, abdomen and pelvis 08/24/2014 showed in addition a mass in the upper outer quadrant left breast measuring 1.6 cm and a nodule in the minor fissure of the right lung measuring 1.2 cm, but no parenchymal lung or liver lesions.   (a) CA 27-29 was noninformative at 38 (09/20/2014)   08/29/2014 Mammogram   mammography and ultrasonography 08/29/2014 show a mass in the upper inner left breast which was palpable,  measuring 2.0 cm by ultrasound. Biopsy of this mass 08/29/2014 showed an invasive breast cancer with both lobular and ductal features, estrogen receptor positive, progesterone receptor weakly positive, with an MIB-1 in the 40% range, HER-2 equivocal (6 else ratio 1.5, but average number her nucleus 5.8    08/31/2014 - 05/29/2017 Anti-estrogen oral therapy   letrozole started 08/31/2014;   (a) palbociclib added Sept 2016 at 75 mg 21/7, with significant neutropenia; not repeated after first cycle  (b) letrozole discontinued 05/29/2017 with evidence of disease progression in the breast   10/20/2014 Surgery   10/20/2014 the patient underwent T2-T3 and T4 decompressive laminectomy with removal of epidural tumor, C7-T4 segmental pedicle screw instrumentation with virage screw system with arrow guidance protocol and C7-T4 posterolateral fusion. The cells were positive for the estrogen receptor. HER-2/neu testing by [name] showed again equivocal results,   11/23/2014 - 12/11/2014 Radiation Therapy   11/23/2014-12/11/2014.  (a) T1-T5 was treated to 30 Gy in 12 fractions at 2.5 Gy per fraction    09/11/2015 Initial Diagnosis   Malignant neoplasm of upper-outer quadrant of left breast in female, estrogen  receptor positive ([name])   05/29/2017 - 06/21/2018 Anti-estrogen oral therapy   fulvestrant started 05/29/2017, last dose 07/22/2018 (discontinued due to pandemic).  (a) started palbociclib 06/29/2017 at 75 mg every other day for 21 days on, 7 days off  (b)  palbociclib discontinued on 3/29 due to progressive fatigue and patient preference   05/04/2018 Surgery   status post left lumpectomy 05/03/2018 showing a pT1b pNX invasive ductal carcinoma, grade 2, with equivocal HER-2 results  (a) opted against adjuvant radiation given presence of stage IV disease   11/02/2018 PET scan   PET scan 11/02/2018 showed no active disease  (a) cerianna scan on 10/31/2019 shows activity in 2 liver lesions, no active bone or lung lesions  (b) abdominal ultrasound 11/08/2019 confirms 2 liver lesions measuring 4.4 and 2.1 cm.  (c) biopsy of 1 of the liver lesions 12/12/2019 confirmed metastatic breast cancer, estrogen and progesterone receptor positive, with an MIB-1 of 10%, and HER-2 positive, with a copy number of 2.12 and the number per cell 6.25  (d) abdominal ultrasound 03/12/2020 shows one of the liver lesions to have increased, while the second lesion has decreased   01/25/2019 - 12/02/2019 Anti-estrogen oral therapy    letrozole started 02/15/2019, discontinued 12/02/2019 with evidence of progression  (a) bone density 09/12/2016 showed a T score of -4.8.    12/08/2019 - 01/24/2021 Anti-estrogen oral therapy   fulvestrant resumed 12/08/2019  (a) palbociclib added at 75 mg daily 21/7 starting 12/20/2019   (b) fulvestrant and palbociclib discontinued September 2022 with evidence of progression.   04/25/2020 -  Antibody Plan   trastuzumab started 04/25/2020, repeat every 28 days  (a) echo 03/12/2020 shows an ejection fraction in the 60-65% range  (b) changed to subQ formulation of trastuzumab 06/20/2020  (c) trastuzumab discontinued September 2022 with MRI of the abdomen showing evidence of progression    Genetic Testing   genetics testing using the Breast/Ovarian Cancer Panel through [facility] [name], MD) found no deleterious mutations in ATM, BARD1, BRCA1, BRCA2, BRIP1, CDH1, CHEK2, EPCAM, FANCC, MLH1, MSH2, MSH6, NBN, PALB2, PMS2, PTEN, RAD51C, RAD51D, STK11,  TP53, or XRCC2      01/28/2021 -  Chemotherapy   Patient is on Treatment Plan : BREAST METASTATIC Fam-Trastuzumab Deruxtecan-nxki (Enhertu) (5.4) q21d     02/07/2021 -  Chemotherapy   Patient is on Treatment Plan :  BREAST METASTATIC fam-trastuzumab deruxtecan-nxki (Enhertu) q21d    starting Enhertu on 02/07/2021  (A) echo 11/21/2020 showed an ejection fraction in the 60-65% range  (B) Enhertu changed to every 4 weeks after the second dose to allow for additional recovery time  (C) treatment held after the third dose with significant drop in the patient's functional status  (D) restaging abdominal MRI 05/14/2021    04/25/2021 Imaging    acute DVT involving the right femoral vein and other proximal veins.  The left side was benign  (A) rivaroxaban started 04/25/2021, she stopped it 07/03/2021   07/17/2021 Imaging   MRI abdomen done 07/17/2021 showed multifocal hepatic metastatic disease with similar distribution when compared to prior study using diffusion-weighted images lesions are quite similar but with perhaps slight interval decrease size and some of the smaller lesions, no gross areas of new disease in the liver   10/18/2021 Imaging   IMPRESSION: 1. Widespread metastatic disease to the liver appears generally stable compared to the prior study, with exception of a new lesion in segment 3 of the liver which measures 1.4 x 1.0 cm, as detailed above. 2. Cholelithiasis  without evidence of acute cholecystitis at this time.     11/14/2021 Imaging   We tried to biopsy the lesion that appears worse on MRI, [place] however didn't show any metastatic lesion in the left lobe of the liver. Probable improvement in metastatic disease within the right lobe of the liver based on ultrasound measurements compared to the prior MRI last month. The largest central lesion shows clear decrease in size.   03/21/2022 Imaging   IMPRESSION: 1. Targetoid hypoenhancing lesion of the left lobe of the  liver significantly increased in size. 2. An additional lesion of the central liver dome is decreased in size. 3. Multiple additional subcentimeter lesions scattered throughout the liver are not appreciably changed. 4. Findings are consistent with mixed response to treatment. 5. Cholelithiasis. 6. Coronary artery disease.     CURRENT THERAPY: Enhertu  INTERVAL HISTORY: POOJA CAMUSO 81 y.o. female returns for f/u and evaluation prior to receiving Enhertu today.  She is tolerating treatment moderately well.  She denies any new pain, fever, chills or most importantly shortness of breath.  She does note some fatigue the first week after treatment.  On Friday, October 30 she underwent CT of the abdomen with and without contrast to evaluate her liver metastasis.  This showed a targetoid hypoenhancing lesion of the left lobe of the liver significantly increased in size, an additional lesion of the central liver dome decreased in size, multiple additional subcentimeter lesions scattered throughout the liver not appreciably changed.  Findings were consistent with mixed response to treatment.  Incidentally they noted cholelithiasis and coronary artery disease along with aortic atherosclerosis.     Patient Active Problem List   Diagnosis Date Noted   Acute deep vein thrombosis (DVT) of right femoral vein ([facility]) 09/18/2021   Thoracic compression fracture ([place]) 05/15/2020   Closed compression fracture of body of L1 vertebra ([place]) 10/04/2019   Aortic atherosclerosis ([place]) 09/26/2019   Neuropathy 03/27/2016   Malignant neoplasm of upper-outer quadrant of left breast in female, estrogen receptor positive ([name]) 09/11/2015   BMI 22.0-22.9, adult 03/14/2015   Metastasis to bone ([place]) 10/20/2014   Breast cancer metastasized to multiple sites ([place]) 08/28/2014   Metastatic cancer 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  MEDICAL HISTORY: Past Medical History:  Diagnosis Date   Arthritis    Breast cancer ([place]) 1985/2016   takes [name] daily   Chronic back pain    stenosis/listhesis   Family history of ovarian cancer    Osteoporosis    takes Vit D   Radiation 11/23/14-12/11/14   30 Gy T1-T5     SURGICAL HISTORY: Past Surgical History:  Procedure Laterality Date   ABDOMINAL HYSTERECTOMY     1980   APPENDECTOMY  7017   APPLICATION OF INTRAOPERATIVE CT SCAN N/A 10/20/2014   Procedure: APPLICATION OF INTRAOPERATIVE CAT SCAN;  Surgeon: [name], MD;  Location: [facility];  Service: Neurosurgery;  Laterality: N/A;   BREAST LUMPECTOMY WITH RADIOACTIVE SEED LOCALIZATION Left 05/03/2018   Procedure: LEFT BREAST LUMPECTOMY WITH BRACKETED RADIOACTIVE SEED LOCALIZATION;  Surgeon: [name], MD;  Location: [facility];  Service: General;  Laterality: Left;  BREAST SURGERY Right 1985   THYROID CYST EXCISION  1967    SOCIAL HISTORY: Social History   Socioeconomic History   Marital status: Divorced    Spouse name: Not on file   Number of children: 1   Years of education: Not on file   Highest education level: Not on file  Occupational History   Not on file  Tobacco Use   Smoking status: Former    Packs/day: 0.50    Years: 10.00    Total pack years: 5.00    Types: Cigarettes    Quit date: 05/03/1970    Years since quitting: 51.9   Smokeless tobacco: Former   Tobacco comments:    quit smoking 1970's  Substance and Sexual Activity   Alcohol use: Yes    Alcohol/week: 0.0 standard drinks of alcohol    Comment: Rare   Drug use: No   Sexual activity: Not on file  Other Topics Concern   Not on file  Social  History Narrative   Not on file   Social Determinants of Health   Financial Resource Strain: Not on file  Food Insecurity: Not on file  Transportation Needs: Not on file  Physical Activity: Not on file  Stress: Not on file  Social Connections: Not on file  Intimate Partner Violence: Not on file    FAMILY HISTORY: Family History  Problem Relation Age of Onset   Heart disease Mother    Hypertension Mother    Heart disease Father    Diabetes Father    Breast cancer Paternal Aunt        4 paternal aunts with breast cancer over 57   Prostate cancer Paternal Uncle    Stroke Paternal Grandfather    Ovarian cancer Paternal Aunt    Huntington's disease Other        Nephew, inherited from his father    Review of Systems  Constitutional:  Positive for fatigue. Negative for appetite change, chills, fever and unexpected weight change.  HENT:   Negative for hearing loss, lump/mass and trouble swallowing.   Eyes:  Negative for eye problems and icterus.  Respiratory:  Negative for chest tightness, cough and shortness of breath.   Cardiovascular:  Negative for chest pain, leg swelling and palpitations.  Gastrointestinal:  Negative for abdominal distention, abdominal pain, constipation, diarrhea, nausea and vomiting.  Endocrine: Negative for hot flashes.  Genitourinary:  Negative for difficulty urinating.   Musculoskeletal:  Negative for arthralgias.  Skin:  Negative for itching and rash.  Neurological:  Negative for dizziness, extremity weakness, headaches and numbness.  Hematological:  Negative for adenopathy. Does not bruise/bleed easily.  Psychiatric/Behavioral:  Negative for depression. The patient is not nervous/anxious.       PHYSICAL EXAMINATION  ECOG PERFORMANCE STATUS: 1 - Symptomatic but completely ambulatory  Vitals:   03/24/22 1215  BP: (!) 157/81  Pulse: (!) 109  Resp: 16  Temp: 98.8 F (37.1 C)  SpO2: 97%    Physical Exam Constitutional:      General: She  is not in acute distress.    Appearance: Normal appearance. She is not toxic-appearing.  HENT:     Head: Normocephalic and atraumatic.  Eyes:     General: No scleral icterus. Cardiovascular:     Rate and Rhythm: Normal rate and regular rhythm.     Pulses: Normal pulses.     Heart sounds: Normal heart sounds.  Pulmonary:     Effort: Pulmonary effort is normal.  Breath sounds: Normal breath sounds.  Abdominal:     General: Abdomen is flat. Bowel sounds are normal. There is no distension.     Palpations: Abdomen is soft.     Tenderness: There is no abdominal tenderness.  Musculoskeletal:        General: No swelling.     Cervical back: Neck supple.  Lymphadenopathy:     Cervical: No cervical adenopathy.  Skin:    General: Skin is warm and dry.     Findings: No rash.  Neurological:     General: No focal deficit present.     Mental Status: She is alert.  Psychiatric:        Mood and Affect: Mood normal.        Behavior: Behavior normal.     LABORATORY DATA:  CBC    Component Value Date/Time   WBC 5.1 03/24/2022 1202   WBC 4.5 11/14/2021 1150   RBC 4.29 03/24/2022 1202   HGB 13.9 03/24/2022 1202   HGB 15.1 05/29/2017 1417   HCT 39.9 03/24/2022 1202   HCT 45.1 05/29/2017 1417   PLT 271 03/24/2022 1202   PLT 262 05/29/2017 1417   MCV 93.0 03/24/2022 1202   MCV 91.7 05/29/2017 1417   MCH 32.4 03/24/2022 1202   MCHC 34.8 03/24/2022 1202   RDW 14.6 03/24/2022 1202   RDW 14.1 05/29/2017 1417   LYMPHSABS 1.1 03/24/2022 1202   LYMPHSABS 1.1 05/29/2017 1417   MONOABS 0.5 03/24/2022 1202   MONOABS 0.4 05/29/2017 1417   EOSABS 0.2 03/24/2022 1202   EOSABS 0.1 05/29/2017 1417   BASOSABS 0.0 03/24/2022 1202   BASOSABS 0.1 05/29/2017 1417    CMP     Component Value Date/Time   NA 140 03/24/2022 1202   NA 141 05/29/2017 1417   K 3.3 (L) 03/24/2022 1202   K 3.6 05/29/2017 1417   CL 106 03/24/2022 1202   CO2 30 03/24/2022 1202   CO2 27 05/29/2017 1417   GLUCOSE  130 (H) 03/24/2022 1202   GLUCOSE 111 05/29/2017 1417   BUN 8 03/24/2022 1202   BUN 17.0 05/29/2017 1417   CREATININE 0.57 03/24/2022 1202   CREATININE 0.66 03/05/2020 1218   CREATININE 0.8 05/29/2017 1417   CALCIUM 9.8 03/24/2022 1202   CALCIUM 10.7 (H) 06/26/2017 1216   CALCIUM 10.4 05/29/2017 1417   PROT 6.3 (L) 03/24/2022 1202   PROT 7.3 05/29/2017 1417   ALBUMIN 3.5 03/24/2022 1202   ALBUMIN 3.9 05/29/2017 1417   AST 20 03/24/2022 1202   AST 17 05/29/2017 1417   ALT 11 03/24/2022 1202   ALT 15 05/29/2017 1417   ALKPHOS 39 03/24/2022 1202   ALKPHOS 42 05/29/2017 1417   BILITOT 0.4 03/24/2022 1202   BILITOT 0.30 05/29/2017 1417   GFRNONAA >60 03/24/2022 1202   GFRNONAA 83 03/05/2020 1218   GFRAA 97 03/05/2020 1218     ASSESSMENT and THERAPY PLAN:   Malignant neoplasm of upper-outer quadrant of left breast in female, estrogen receptor positive ([place]) Breckyn Troyer is a 81 year old woman with metastatic breast cancer currently on treatment with Enhertu.  Her most recent restaging showed an mixed response to treatment with and increasing targetoid lesion in the right liver lobe.  I reviewed her CT scan findings with her in detail after Dr. Chryl Heck and I discussed them thoroughly.  It is unclear what is going on in the right lobe of the liver and a biopsy would be helpful to understand along with the possible ablation.  I have reached out to Dr. Earleen Newport in interventional radiology who is on-call today to talk to him about this option for the patient.  [name] will proceed with Enhertu today.  We will let her know once we hear from radiology there opinions on her imaging and next steps.  She will continue to follow-up with Dr. Nani Ravens about her heart and for her echocardiograms.  Will return in 3 weeks for labs, follow-up, and her next Enhertu treatment.   All questions were answered. The patient knows to call the clinic with any problems, questions or concerns. We can  certainly see the patient much sooner if necessary.  Total encounter time:30 minutes*in face-to-face visit time, chart review, lab review, care coordination, order entry, and documentation of the encounter time.  Wilber Bihari, NP 03/24/22 2:55 PM Medical Oncology and Hematology [facility] [place], [place] [address] Tel. [phone]    Fax. [phone]  *Total Encounter Time as defined by the Centers for Medicare and Medicaid Services includes, in addition to the face-to-face time of a patient visit (documented in the note above) non-face-to-face time: obtaining and reviewing outside history, ordering and reviewing medications, tests or procedures, care coordination (communications with other health care professionals or caregivers) and documentation in the medical record.

## 2022-03-26 ENCOUNTER — Telehealth: Payer: Self-pay | Admitting: Adult Health

## 2022-03-26 NOTE — Telephone Encounter (Signed)
Scheduled appointment per WQ. Patient is aware. 

## 2022-03-27 ENCOUNTER — Other Ambulatory Visit: Payer: Self-pay

## 2022-03-27 ENCOUNTER — Encounter: Payer: Self-pay | Admitting: Adult Health

## 2022-03-28 ENCOUNTER — Other Ambulatory Visit: Payer: Self-pay

## 2022-03-28 ENCOUNTER — Telehealth: Payer: Self-pay

## 2022-03-28 DIAGNOSIS — C50412 Malignant neoplasm of upper-outer quadrant of left female breast: Secondary | ICD-10-CM

## 2022-03-28 DIAGNOSIS — C50919 Malignant neoplasm of unspecified site of unspecified female breast: Secondary | ICD-10-CM

## 2022-03-28 NOTE — Telephone Encounter (Signed)
Per NP, spoke with [name] in IR to assist in ordering and scheduling liver bx and possible ablation for patient. Orders entered per NP and Anderson Malta at [facility] will call pt to schedule.

## 2022-04-01 ENCOUNTER — Other Ambulatory Visit: Payer: Self-pay

## 2022-04-08 ENCOUNTER — Encounter: Payer: Self-pay | Admitting: Hematology and Oncology

## 2022-04-09 ENCOUNTER — Telehealth: Payer: Self-pay | Admitting: *Deleted

## 2022-04-09 NOTE — Telephone Encounter (Signed)
Have tried to call April Rosales several time to schedule consult for bx and possible ablation. No answer and no machine./vm

## 2022-04-14 ENCOUNTER — Telehealth: Payer: Self-pay | Admitting: *Deleted

## 2022-04-14 MED FILL — Dexamethasone Sodium Phosphate Inj 100 MG/10ML: INTRAMUSCULAR | Qty: 1 | Status: AC

## 2022-04-14 MED FILL — Fosaprepitant Dimeglumine For IV Infusion 150 MG (Base Eq): INTRAVENOUS | Qty: 5 | Status: AC

## 2022-04-14 NOTE — Telephone Encounter (Signed)
This RN returned call to pt per her VM stating she feels "so weak that I do not think I can do the treatment tomorrow "  Arbie Cookey states she usually has weakness post her treatment- but seems this therapy it is lasting longer. She states she did have abd scan prior to last treatment- "does that cause a different interaction?"  This RN stated contrast and chemo do not have an interaction- but she may have increased fatigue from having to do both procedures.  Per discussion this RN recommended for pt to come in for lab and visit for evaluation especially due to holiday weekend and possible need for support therapy.  Treatment can be delayed per visit.  Deondrea is in agreement to the above and will come for lab and visit.

## 2022-04-15 ENCOUNTER — Inpatient Hospital Stay: Payer: Medicare Other

## 2022-04-15 ENCOUNTER — Inpatient Hospital Stay (HOSPITAL_BASED_OUTPATIENT_CLINIC_OR_DEPARTMENT_OTHER): Payer: Medicare Other | Admitting: Physician Assistant

## 2022-04-15 ENCOUNTER — Inpatient Hospital Stay: Payer: Medicare Other | Attending: Oncology

## 2022-04-15 ENCOUNTER — Other Ambulatory Visit: Payer: Self-pay

## 2022-04-15 ENCOUNTER — Inpatient Hospital Stay: Payer: Medicare Other | Admitting: Hematology and Oncology

## 2022-04-15 VITALS — BP 156/86 | HR 109 | Temp 98.3°F | Resp 18 | Wt 105.4 lb

## 2022-04-15 DIAGNOSIS — R000 Tachycardia, unspecified: Secondary | ICD-10-CM | POA: Diagnosis not present

## 2022-04-15 DIAGNOSIS — C50412 Malignant neoplasm of upper-outer quadrant of left female breast: Secondary | ICD-10-CM | POA: Diagnosis not present

## 2022-04-15 DIAGNOSIS — C50919 Malignant neoplasm of unspecified site of unspecified female breast: Secondary | ICD-10-CM

## 2022-04-15 DIAGNOSIS — E876 Hypokalemia: Secondary | ICD-10-CM

## 2022-04-15 DIAGNOSIS — R001 Bradycardia, unspecified: Secondary | ICD-10-CM | POA: Insufficient documentation

## 2022-04-15 DIAGNOSIS — R42 Dizziness and giddiness: Secondary | ICD-10-CM | POA: Insufficient documentation

## 2022-04-15 DIAGNOSIS — Z170 Estrogen receptor positive status [ER+]: Secondary | ICD-10-CM

## 2022-04-15 DIAGNOSIS — K5900 Constipation, unspecified: Secondary | ICD-10-CM | POA: Insufficient documentation

## 2022-04-15 DIAGNOSIS — C787 Secondary malignant neoplasm of liver and intrahepatic bile duct: Secondary | ICD-10-CM | POA: Diagnosis not present

## 2022-04-15 DIAGNOSIS — R531 Weakness: Secondary | ICD-10-CM

## 2022-04-15 DIAGNOSIS — Z87891 Personal history of nicotine dependence: Secondary | ICD-10-CM | POA: Diagnosis not present

## 2022-04-15 DIAGNOSIS — C7951 Secondary malignant neoplasm of bone: Secondary | ICD-10-CM | POA: Insufficient documentation

## 2022-04-15 LAB — CBC WITH DIFFERENTIAL (CANCER CENTER ONLY)
Abs Immature Granulocytes: 0.01 10*3/uL (ref 0.00–0.07)
Basophils Absolute: 0.1 10*3/uL (ref 0.0–0.1)
Basophils Relative: 1 %
Eosinophils Absolute: 0.2 10*3/uL (ref 0.0–0.5)
Eosinophils Relative: 4 %
HCT: 39.8 % (ref 36.0–46.0)
Hemoglobin: 13.9 g/dL (ref 12.0–15.0)
Immature Granulocytes: 0 %
Lymphocytes Relative: 15 %
Lymphs Abs: 0.9 10*3/uL (ref 0.7–4.0)
MCH: 32.8 pg (ref 26.0–34.0)
MCHC: 34.9 g/dL (ref 30.0–36.0)
MCV: 93.9 fL (ref 80.0–100.0)
Monocytes Absolute: 0.6 10*3/uL (ref 0.1–1.0)
Monocytes Relative: 10 %
Neutro Abs: 4.2 10*3/uL (ref 1.7–7.7)
Neutrophils Relative %: 70 %
Platelet Count: 305 10*3/uL (ref 150–400)
RBC: 4.24 MIL/uL (ref 3.87–5.11)
RDW: 14.6 % (ref 11.5–15.5)
WBC Count: 6 10*3/uL (ref 4.0–10.5)
nRBC: 0 % (ref 0.0–0.2)

## 2022-04-15 LAB — CMP (CANCER CENTER ONLY)
ALT: 11 U/L (ref 0–44)
AST: 20 U/L (ref 15–41)
Albumin: 3.7 g/dL (ref 3.5–5.0)
Alkaline Phosphatase: 44 U/L (ref 38–126)
Anion gap: 6 (ref 5–15)
BUN: 6 mg/dL — ABNORMAL LOW (ref 8–23)
CO2: 31 mmol/L (ref 22–32)
Calcium: 10.6 mg/dL — ABNORMAL HIGH (ref 8.9–10.3)
Chloride: 102 mmol/L (ref 98–111)
Creatinine: 0.47 mg/dL (ref 0.44–1.00)
GFR, Estimated: >60 mL/min (ref >60)
Glucose, Bld: 131 mg/dL — ABNORMAL HIGH (ref 70–99)
Potassium: 3.1 mmol/L — ABNORMAL LOW (ref 3.5–5.1)
Sodium: 139 mmol/L (ref 135–145)
Total Bilirubin: 0.4 mg/dL (ref 0.3–1.2)
Total Protein: 6.7 g/dL (ref 6.5–8.1)

## 2022-04-15 LAB — SAMPLE TO BLOOD BANK

## 2022-04-15 NOTE — Progress Notes (Signed)
Symptom Management Consult note [place]    Patient Care Team: [name], MD as PCP - General (Internal Medicine) [name], MD as Consulting Physician (Orthopedic Surgery) [name], MD as Consulting Physician (General Surgery) [name], MD as Consulting Physician (Cardiology) [name], RPH-CPP (Pharmacist)    Name of the patient: April Rosales  [id number]  [birth year]   Date of visit: 04/15/2022   Chief Complaint/Reason for visit: dizziness   Current Therapy: Enhertu  Last treatment:  Day 1   Cycle 19 on 03/24/22   ASSESSMENT & PLAN: Patient is a 81 y.o. female  with oncologic history of malignant neoplasm of upper-outer quadrant of left breast in female, estrogen receptor positive  followed by Dr. Chryl Heck.  I have viewed most recent oncology  as well as cardiology notes and lab work.    #) Malignant neoplasm of upper-outer quadrant of left breast in female, estrogen receptor positive  - Patient asking to hold treatment today, oncologist aware - Next appointment with oncologist is 05/07/22   #) Dizziness -Patient with constant dizziness after last treatment. Per patient dizziness significantly improved in the last week.  -Normal neuro exam today. Patient clinically appears well hydrated.  #) Tachycardia -Closely followed by cardiology for this.  -HR today in the low 100s. No infectious symptoms and labs reassuring without leukocytosis.   #)Hypercalcemia -CMP today showing 10.6, up from 9.8 x 3 weeks ago. Patient had Prolia injection 03/03/22.  - Likely related to bone mets. Patient asymptomatic and has mild hypercalcemia on lab today.  -Advised her to take vitamin D every other day for the rest of this week as a way to avoid aggravating hypercalcemia.  #)Hypokalemia -CMP today showing K 3.1. - Discussed hypok management with patient and she prefers trying increasing dietary K and avoiding PO replacement at this time  as she is frustrated taking multiple medications. Lengthy discussion had about dietary choices to try.  #) Constipation -Patient had bowel movement today though described as small in nature.  Abdominal exam is benign and she has normal active bowel sounds.  Low suspicion for SBO. -Discussed over-the-counter management for constipation including daily Senokot and adding in MiraLAX.  Strict ED precautions discussed should symptoms worsen.   Heme/Onc History: Oncology History  Breast cancer metastasized to multiple sites [facility])  08/28/2014 Initial Diagnosis   Breast cancer metastasized to multiple sites [facility])   01/28/2021 -  Chemotherapy   Patient is on Treatment Plan : BREAST METASTATIC Fam-Trastuzumab Deruxtecan-nxki (Enhertu) (5.4) q21d     Metastasis to bone ([place])  10/20/2014 Initial Diagnosis   Metastasis to bone ([place])   01/28/2021 -  Chemotherapy   Patient is on Treatment Plan : BREAST METASTATIC Fam-Trastuzumab Deruxtecan-nxki (Enhertu) (5.4) q21d     10/18/2021 Imaging   IMPRESSION: 1. Widespread metastatic disease to the liver appears generally stable compared to the prior study, with exception of a new lesion in segment 3 of the liver which measures 1.4 x 1.0 cm, as detailed above. 2. Cholelithiasis without evidence of acute cholecystitis at this time.     Malignant neoplasm of upper-outer quadrant of left breast in female, estrogen receptor positive ([place])  09/02/1983 Surgery   status post right lumpectomy and axillary node dissection in 1985 followed by radiation at [facility], exact date not clear.   08/16/2014 Imaging   METASTATIC DISEASE 08/16/2014: measurable disease in spine, lung and left breast   (2) evaluation for left shoulder pain led to  thoracic spine MRI 08/16/2014 showing a pathologic fracture at T3 with epidural tumor displacing the cord to the right, but no cord compression. CT scans of the chest, abdomen and pelvis 08/24/2014 showed in addition a mass in the upper outer  quadrant left breast measuring 1.6 cm and a nodule in the minor fissure of the right lung measuring 1.2 cm, but no parenchymal lung or liver lesions.   (a) CA 27-29 was noninformative at 38 (09/20/2014)   08/29/2014 Mammogram   mammography and ultrasonography 08/29/2014 show a mass in the upper inner left breast which was palpable,  measuring 2.0 cm by ultrasound. Biopsy of this mass 08/29/2014 showed an invasive breast cancer with both lobular and ductal features, estrogen receptor positive, progesterone receptor weakly positive, with an MIB-1 in the 40% range, HER-2 equivocal (6 else ratio 1.5, but average number her nucleus 5.8    08/31/2014 - 05/29/2017 Anti-estrogen oral therapy   letrozole started 08/31/2014;   (a) palbociclib added Sept 2016 at 75 mg 21/7, with significant neutropenia; not repeated after first cycle  (b) letrozole discontinued 05/29/2017 with evidence of disease progression in the breast   10/20/2014 Surgery   10/20/2014 the patient underwent T2-T3 and T4 decompressive laminectomy with removal of epidural tumor, C7-T4 segmental pedicle screw instrumentation with virage screw system with arrow guidance protocol and C7-T4 posterolateral fusion. The cells were positive for the estrogen receptor. HER-2/neu testing by [facility] showed again equivocal results,   11/23/2014 - 12/11/2014 Radiation Therapy   11/23/2014-12/11/2014.  (a) T1-T5 was treated to 30 Gy in 12 fractions at 2.5 Gy per fraction    09/11/2015 Initial Diagnosis   Malignant neoplasm of upper-outer quadrant of left breast in female, estrogen receptor positive ([place])   05/29/2017 - 06/21/2018 Anti-estrogen oral therapy   fulvestrant started 05/29/2017, last dose 07/22/2018 (discontinued due to pandemic).  (a) started palbociclib 06/29/2017 at 75 mg every other day for 21 days on, 7 days off  (b) palbociclib discontinued on 3/29 due to progressive fatigue and patient preference   05/04/2018 Surgery   status post left lumpectomy  05/03/2018 showing a pT1b pNX invasive ductal carcinoma, grade 2, with equivocal HER-2 results  (a) opted against adjuvant radiation given presence of stage IV disease   11/02/2018 PET scan   PET scan 11/02/2018 showed no active disease  (a) cerianna scan on 10/31/2019 shows activity in 2 liver lesions, no active bone or lung lesions  (b) abdominal ultrasound 11/08/2019 confirms 2 liver lesions measuring 4.4 and 2.1 cm.  (c) biopsy of 1 of the liver lesions 12/12/2019 confirmed metastatic breast cancer, estrogen and progesterone receptor positive, with an MIB-1 of 10%, and HER-2 positive, with a copy number of 2.12 and the number per cell 6.25  (d) abdominal ultrasound 03/12/2020 shows one of the liver lesions to have increased, while the second lesion has decreased   01/25/2019 - 12/02/2019 Anti-estrogen oral therapy    letrozole started 02/15/2019, discontinued 12/02/2019 with evidence of progression  (a) bone density 09/12/2016 showed a T score of -4.8.    12/08/2019 - 01/24/2021 Anti-estrogen oral therapy   fulvestrant resumed 12/08/2019  (a) palbociclib added at 75 mg daily 21/7 starting 12/20/2019   (b) fulvestrant and palbociclib discontinued September 2022 with evidence of progression.   04/25/2020 -  Antibody Plan   trastuzumab started 04/25/2020, repeat every 28 days  (a) echo 03/12/2020 shows an ejection fraction in the 60-65% range  (b) changed to subQ formulation of trastuzumab 06/20/2020  (c) trastuzumab discontinued September  2022 with MRI of the abdomen showing evidence of progression    Genetic Testing   genetics testing using the Breast/Ovarian Cancer Panel through [facility] [name], MD) found no deleterious mutations in ATM, BARD1, BRCA1, BRCA2, BRIP1, CDH1, CHEK2, EPCAM, FANCC, MLH1, MSH2, MSH6, NBN, PALB2, PMS2, PTEN, RAD51C, RAD51D, STK11, TP53, or XRCC2      01/28/2021 -  Chemotherapy   Patient is on Treatment Plan : BREAST METASTATIC Fam-Trastuzumab Deruxtecan-nxki  (Enhertu) (5.4) q21d     02/07/2021 -  Chemotherapy   Patient is on Treatment Plan :  BREAST METASTATIC fam-trastuzumab deruxtecan-nxki (Enhertu) q21d    starting Enhertu on 02/07/2021  (A) echo 11/21/2020 showed an ejection fraction in the 60-65% range  (B) Enhertu changed to every 4 weeks after the second dose to allow for additional recovery time  (C) treatment held after the third dose with significant drop in the patient's functional status  (D) restaging abdominal MRI 05/14/2021    04/25/2021 Imaging    acute DVT involving the right femoral vein and other proximal veins.  The left side was benign  (A) rivaroxaban started 04/25/2021, she stopped it 07/03/2021   07/17/2021 Imaging   MRI abdomen done 07/17/2021 showed multifocal hepatic metastatic disease with similar distribution when compared to prior study using diffusion-weighted images lesions are quite similar but with perhaps slight interval decrease size and some of the smaller lesions, no gross areas of new disease in the liver   10/18/2021 Imaging   IMPRESSION: 1. Widespread metastatic disease to the liver appears generally stable compared to the prior study, with exception of a new lesion in segment 3 of the liver which measures 1.4 x 1.0 cm, as detailed above. 2. Cholelithiasis without evidence of acute cholecystitis at this time.     11/14/2021 Imaging   We tried to biopsy the lesion that appears worse on MRI, [place] however didn't show any metastatic lesion in the left lobe of the liver. Probable improvement in metastatic disease within the right lobe of the liver based on ultrasound measurements compared to the prior MRI last month. The largest central lesion shows clear decrease in size.   03/21/2022 Imaging   IMPRESSION: 1. Targetoid hypoenhancing lesion of the left lobe of the liver significantly increased in size. 2. An additional lesion of the central liver dome is decreased in size. 3. Multiple additional  subcentimeter lesions scattered throughout the liver are not appreciably changed. 4. Findings are consistent with mixed response to treatment. 5. Cholelithiasis. 6. Coronary artery disease.       Interval history-: ANMOL PASCHEN is a 81 y.o. female with oncologic history as above presenting to [facility] today with chief complaint of dizziness x approximately 1 month.  Patient presents unaccompanied to clinic today.  Patient states ever since her last Enhertu chemotherapy on 03/24/2020 she has been feeling overall unwell.  She was constantly dizzy up until x1 week ago.  She states the dizziness has much improved and is only present when changing positions if she gets up too quickly.  She did not have any falls or head injuries.  She describes the dizziness as objects spinning around her, not necessarily the room spinning.  Patient states her appetite and fluid intake have been good.  She drinks typically 50 ounces of liquid daily.  She does struggle with constipation and takes Senokot as needed.  Last bowel movement was today and she described it as small hard stool.  Patient reports she already discussed with oncology nurse that she  will not be having treatment today during her phone call yesterday as she wanted more time to recover.  She denies any sick contacts.  Denies any fever chills, chest pain or shortness of breath.         ROS  All other systems are reviewed and are negative for acute change except as noted in the HPI.    Allergies  Allergen Reactions   Ativan [Lorazepam]     Made her crazy   Dilaudid [Hydromorphone Hcl]     Doesn't want   Haldol [Haloperidol Lactate]     Made her crazy   Xarelto [Rivaroxaban]     Pt reports debilitating dizziness.     Past Medical History:  Diagnosis Date   Arthritis    Breast cancer [facility]) 1985/2016   takes [name] daily   Chronic back pain    stenosis/listhesis   Family history of ovarian cancer    Osteoporosis    takes Vit D    Radiation 11/23/14-12/11/14   30 Gy T1-T5      Past Surgical History:  Procedure Laterality Date   ABDOMINAL HYSTERECTOMY     1980   APPENDECTOMY  6578   APPLICATION OF INTRAOPERATIVE CT SCAN N/A 10/20/2014   Procedure: APPLICATION OF INTRAOPERATIVE CAT SCAN;  Surgeon: [name], MD;  Location: [place] [facility];  Service: Neurosurgery;  Laterality: N/A;   BREAST LUMPECTOMY WITH RADIOACTIVE SEED LOCALIZATION Left 05/03/2018   Procedure: LEFT BREAST LUMPECTOMY WITH BRACKETED RADIOACTIVE SEED LOCALIZATION;  Surgeon: [name], MD;  Location: [place];  Service: General;  Laterality: Left;   BREAST SURGERY Right 1985   THYROID CYST EXCISION  1967    Social History   Socioeconomic History   Marital status: Divorced    Spouse name: Not on file   Number of children: 1   Years of education: Not on file   Highest education level: Not on file  Occupational History   Not on file  Tobacco Use   Smoking status: Former    Packs/day: 0.50    Years: 10.00    Total pack years: 5.00    Types: Cigarettes    Quit date: 05/03/1970    Years since quitting: 51.9   Smokeless tobacco: Former   Tobacco comments:    quit smoking 1970's  Substance and Sexual Activity   Alcohol use: Yes    Alcohol/week: 0.0 standard drinks of alcohol    Comment: Rare   Drug use: No   Sexual activity: Not on file  Other Topics Concern   Not on file  Social History Narrative   Not on file   Social Determinants of Health   Financial Resource Strain: Not on file  Food Insecurity: Not on file  Transportation Needs: Not on file  Physical Activity: Not on file  Stress: Not on file  Social Connections: Not on file  Intimate Partner Violence: Not on file    Family History  Problem Relation Age of Onset   Heart disease Mother    Hypertension Mother    Heart disease Father    Diabetes Father    Breast cancer Paternal Aunt        4 paternal aunts with breast cancer over 68   Prostate  cancer Paternal Uncle    Stroke Paternal Grandfather    Ovarian cancer Paternal Aunt    Huntington's disease Other        Nephew, inherited from his father     Current Outpatient Medications:  Cholecalciferol (VITAMIN D3 ADULT GUMMIES PO), Take 2,000 Units by mouth daily. , Disp: , Rfl:    Cyanocobalamin (VITAMIN B 12 PO), Take 1 tablet by mouth every other day., Disp: , Rfl:    diazepam (VALIUM) 5 MG tablet, Take 1 tablet (5 mg total) by mouth every 6 (six) hours as needed for anxiety. (Patient not taking: Reported on 03/24/2022), Disp: 20 tablet, Rfl: 0   Iron-Vitamins (GERITOL PO), Take by mouth daily., Disp: , Rfl:    OVER THE COUNTER MEDICATION, OTC Apple Cider Vinegar 1 capsule daily., Disp: , Rfl:    traMADol (ULTRAM) 50 MG tablet, Take 1 tablet (50 mg total) by mouth every 8 (eight) hours as needed. (Patient not taking: Reported on 03/24/2022), Disp: 30 tablet, Rfl: 0  PHYSICAL EXAM: ECOG FS:1 - Symptomatic but completely ambulatory    Vitals:   04/15/22 1332  BP: (!) 156/86  Pulse: (!) 109  Resp: 18  Temp: 98.3 F (36.8 C)  TempSrc: Oral  SpO2: 95%  Weight: 105 lb 6.4 oz (47.8 kg)   Physical Exam Vitals and nursing note reviewed.  Constitutional:      Appearance: She is well-developed. She is not ill-appearing or toxic-appearing.  HENT:     Head: Normocephalic.     Right Ear: Tympanic membrane normal.     Left Ear: Tympanic membrane normal.     Nose: Nose normal.     Mouth/Throat:     Mouth: Mucous membranes are moist.  Eyes:     Conjunctiva/sclera: Conjunctivae normal.  Neck:     Vascular: No JVD.  Cardiovascular:     Rate and Rhythm: Regular rhythm. Tachycardia present.     Pulses: Normal pulses.     Heart sounds: Normal heart sounds.  Pulmonary:     Effort: Pulmonary effort is normal.     Breath sounds: Normal breath sounds.  Abdominal:     General: Bowel sounds are normal. There is no distension.     Palpations: Abdomen is soft. There is no mass.      Tenderness: There is no abdominal tenderness. There is no right CVA tenderness, left CVA tenderness, guarding or rebound.  Musculoskeletal:     Cervical back: Normal range of motion.     Right lower leg: No edema.     Left lower leg: No edema.  Skin:    General: Skin is warm and dry.  Neurological:     Mental Status: She is oriented to person, place, and time.     Comments: Mental Status:  Alert, oriented, thought content appropriate, able to give a coherent history. Speech fluent without evidence of aphasia. Able to follow 2 step commands without difficulty.  Cranial Nerves:  II:  Peripheral visual fields grossly normal, pupils equal, round, reactive to light III,IV, VI: ptosis not present, extra-ocular motions intact bilaterally  V,VII: smile symmetric, facial light touch sensation equal VIII: hearing grossly normal to voice  X: uvula elevates symmetrically  XI: bilateral shoulder shrug symmetric and strong XII: midline tongue extension without fassiculations Motor:  Normal tone. 5/5 in upper and lower extremities bilaterally including strong and equal grip strength and dorsiflexion/plantar flexion Sensory: Pinprick and light touch normal in all extremities.  Cerebellar: normal finger-to-nose with bilateral upper extremities Gait: normal gait and balance CV: distal pulses palpable throughout            LABORATORY DATA: I have reviewed the data as listed    Latest Ref Rng & Units 04/15/2022  1:04 PM 03/24/2022   12:02 PM 03/03/2022   11:35 AM  CBC  WBC 4.0 - 10.5 K/uL 6.0  5.1  5.2   Hemoglobin 12.0 - 15.0 g/dL 13.9  13.9  14.3   Hematocrit 36.0 - 46.0 % 39.8  39.9  40.5   Platelets 150 - 400 K/uL 305  271  258         Latest Ref Rng & Units 04/15/2022    1:04 PM 03/24/2022   12:02 PM 03/03/2022   11:35 AM  CMP  Glucose 70 - 99 mg/dL 131  130  114   BUN 8 - 23 mg/dL _0 Creatinine 0.44 - 1.00 mg/dL 0.47  0.57  0.56   Sodium 135 - 145 mmol/L 139   140  143   Potassium 3.5 - 5.1 mmol/L 3.1  3.3  3.7   Chloride 98 - 111 mmol/L 102  106  108   CO2 22 - 32 mmol/L _1 Calcium 8.9 - 10.3 mg/dL 10.6  9.8  10.1   Total Protein 6.5 - 8.1 g/dL 6.7  6.3  6.5   Total Bilirubin 0.3 - 1.2 mg/dL 0.4  0.4  0.4   Alkaline Phos 38 - 126 U/L 44  39  42   AST 15 - 41 U/L _2 ALT 0 - 44 U/L _3 RADIOGRAPHIC STUDIES (from last 24 hours if applicable) I have personally reviewed the radiological images as listed and agreed with the findings in the report. No results found.      Visit Diagnosis: 1. Hypokalemia   2. Metastasis to bone (HCC)   3. Carcinoma of breast metastatic to multiple sites, unspecified laterality ([place])   4. Malignant neoplasm of upper-outer quadrant of left breast in female, estrogen receptor positive ([place])   5. Hypocalcemia   6. Constipation, unspecified constipation type   7. Tachycardia      No orders of the defined types were placed in this encounter.   All questions were answered. The patient knows to call the clinic with any problems, questions or concerns. No barriers to learning was detected.  I have spent a total of 20 minutes minutes of face-to-face and non-face-to-face time, preparing to see the patient, obtaining and/or reviewing separately obtained history, performing a medically appropriate examination, counseling and educating the patient, ordering tests, documenting clinical information in the electronic health record, and care coordination (communications with other health care professionals or caregivers).    Thank you for allowing me to participate in the care of this patient.    Barrie Folk, PA-C Department of Hematology/Oncology [facility] at [facility] Phone: [phone]  [phone]    04/15/2022 4:15 PM

## 2022-04-15 NOTE — Progress Notes (Signed)
Opened in error

## 2022-04-24 ENCOUNTER — Encounter: Payer: Self-pay | Admitting: Hematology and Oncology

## 2022-04-24 ENCOUNTER — Telehealth: Payer: Self-pay | Admitting: *Deleted

## 2022-04-24 ENCOUNTER — Inpatient Hospital Stay (HOSPITAL_BASED_OUTPATIENT_CLINIC_OR_DEPARTMENT_OTHER): Payer: Medicare Other | Admitting: Hematology and Oncology

## 2022-04-24 ENCOUNTER — Other Ambulatory Visit: Payer: Self-pay

## 2022-04-24 VITALS — BP 169/97 | HR 129 | Temp 98.2°F | Resp 18 | Wt 104.5 lb

## 2022-04-24 DIAGNOSIS — C7951 Secondary malignant neoplasm of bone: Secondary | ICD-10-CM | POA: Diagnosis not present

## 2022-04-24 DIAGNOSIS — C787 Secondary malignant neoplasm of liver and intrahepatic bile duct: Secondary | ICD-10-CM | POA: Diagnosis not present

## 2022-04-24 DIAGNOSIS — C50412 Malignant neoplasm of upper-outer quadrant of left female breast: Secondary | ICD-10-CM | POA: Diagnosis not present

## 2022-04-24 DIAGNOSIS — R001 Bradycardia, unspecified: Secondary | ICD-10-CM | POA: Diagnosis not present

## 2022-04-24 DIAGNOSIS — C50919 Malignant neoplasm of unspecified site of unspecified female breast: Secondary | ICD-10-CM

## 2022-04-24 DIAGNOSIS — E876 Hypokalemia: Secondary | ICD-10-CM | POA: Diagnosis not present

## 2022-04-24 NOTE — Progress Notes (Signed)
[facility]:    [name], [name] [address]   DIAGNOSIS:  Cancer Staging  Breast cancer metastasized to multiple sites [name]) Staging form: Breast, AJCC 7th Edition - Clinical: Stage IV ([name], [name], M1) - Signed by Chauncey Cruel, MD on 08/31/2014  Malignant neoplasm of upper-outer quadrant of left breast in female, estrogen receptor positive ([name]) Staging form: Breast, AJCC 7th Edition - Clinical: Stage IV ([name], [place], M1) - Signed by Chauncey Cruel, MD on 09/11/2015   SUMMARY OF ONCOLOGIC HISTORY: Oncology History  Breast cancer metastasized to multiple sites [name])  08/28/2014 Initial Diagnosis   Breast cancer metastasized to multiple sites ([name])   01/28/2021 -  Chemotherapy   Patient is on Treatment Plan : BREAST METASTATIC Fam-Trastuzumab Deruxtecan-nxki (Enhertu) (5.4) q21d     Metastasis to bone ([name])  10/20/2014 Initial Diagnosis   Metastasis to bone ([name])   01/28/2021 -  Chemotherapy   Patient is on Treatment Plan : BREAST METASTATIC Fam-Trastuzumab Deruxtecan-nxki (Enhertu) (5.4) q21d     10/18/2021 Imaging   IMPRESSION: 1. Widespread metastatic disease to the liver appears generally stable compared to the prior study, with exception of a new lesion in segment 3 of the liver which measures 1.4 x 1.0 cm, as detailed above. 2. Cholelithiasis without evidence of acute cholecystitis at this time.     Malignant neoplasm of upper-outer quadrant of left breast in female, estrogen receptor positive ([name])  09/02/1983 Surgery   status post right lumpectomy and axillary node dissection in 1985 followed by radiation at [facility], exact date not clear.   08/16/2014 Imaging   METASTATIC DISEASE 08/16/2014: measurable disease in spine, lung and left breast   (2) evaluation for left shoulder pain led to thoracic spine MRI 08/16/2014 showing a pathologic fracture at T3 with epidural tumor displacing the cord to the  right, but no cord compression. CT scans of the chest, abdomen and pelvis 08/24/2014 showed in addition a mass in the upper outer quadrant left breast measuring 1.6 cm and a nodule in the minor fissure of the right lung measuring 1.2 cm, but no parenchymal lung or liver lesions.   (a) CA 27-29 was noninformative at 38 (09/20/2014)   08/29/2014 Mammogram   mammography and ultrasonography 08/29/2014 show a mass in the upper inner left breast which was palpable,  measuring 2.0 cm by ultrasound. Biopsy of this mass 08/29/2014 showed an invasive breast cancer with both lobular and ductal features, estrogen receptor positive, progesterone receptor weakly positive, with an MIB-1 in the 40% range, HER-2 equivocal (6 else ratio 1.5, but average number her nucleus 5.8    08/31/2014 - 05/29/2017 Anti-estrogen oral therapy   letrozole started 08/31/2014;   (a) palbociclib added Sept 2016 at 75 mg 21/7, with significant neutropenia; not repeated after first cycle  (b) letrozole discontinued 05/29/2017 with evidence of disease progression in the breast   10/20/2014 Surgery   10/20/2014 the patient underwent T2-T3 and T4 decompressive laminectomy with removal of epidural tumor, C7-T4 segmental pedicle screw instrumentation with virage screw system with arrow guidance protocol and C7-T4 posterolateral fusion. The cells were positive for the estrogen receptor. HER-2/neu testing by [name] showed again equivocal results,   11/23/2014 - 12/11/2014 Radiation Therapy   11/23/2014-12/11/2014.  (a) T1-T5 was treated to 30 Gy in 12 fractions at 2.5 Gy per fraction    09/11/2015 Initial Diagnosis   Malignant neoplasm of upper-outer quadrant of left breast in female, estrogen  receptor positive ([name])   05/29/2017 - 06/21/2018 Anti-estrogen oral therapy   fulvestrant started 05/29/2017, last dose 07/22/2018 (discontinued due to pandemic).  (a) started palbociclib 06/29/2017 at 75 mg every other day for 21 days on, 7 days off  (b)  palbociclib discontinued on 3/29 due to progressive fatigue and patient preference   05/04/2018 Surgery   status post left lumpectomy 05/03/2018 showing a pT1b pNX invasive ductal carcinoma, grade 2, with equivocal HER-2 results  (a) opted against adjuvant radiation given presence of stage IV disease   11/02/2018 PET scan   PET scan 11/02/2018 showed no active disease  (a) cerianna scan on 10/31/2019 shows activity in 2 liver lesions, no active bone or lung lesions  (b) abdominal ultrasound 11/08/2019 confirms 2 liver lesions measuring 4.4 and 2.1 cm.  (c) biopsy of 1 of the liver lesions 12/12/2019 confirmed metastatic breast cancer, estrogen and progesterone receptor positive, with an MIB-1 of 10%, and HER-2 positive, with a copy number of 2.12 and the number per cell 6.25  (d) abdominal ultrasound 03/12/2020 shows one of the liver lesions to have increased, while the second lesion has decreased   01/25/2019 - 12/02/2019 Anti-estrogen oral therapy    letrozole started 02/15/2019, discontinued 12/02/2019 with evidence of progression  (a) bone density 09/12/2016 showed a T score of -4.8.    12/08/2019 - 01/24/2021 Anti-estrogen oral therapy   fulvestrant resumed 12/08/2019  (a) palbociclib added at 75 mg daily 21/7 starting 12/20/2019   (b) fulvestrant and palbociclib discontinued September 2022 with evidence of progression.   04/25/2020 -  Antibody Plan   trastuzumab started 04/25/2020, repeat every 28 days  (a) echo 03/12/2020 shows an ejection fraction in the 60-65% range  (b) changed to subQ formulation of trastuzumab 06/20/2020  (c) trastuzumab discontinued September 2022 with MRI of the abdomen showing evidence of progression    Genetic Testing   genetics testing using the Breast/Ovarian Cancer Panel through [facility] [name], MD) found no deleterious mutations in ATM, BARD1, BRCA1, BRCA2, BRIP1, CDH1, CHEK2, EPCAM, FANCC, MLH1, MSH2, MSH6, NBN, PALB2, PMS2, PTEN, RAD51C, RAD51D, STK11,  TP53, or XRCC2      01/28/2021 -  Chemotherapy   Patient is on Treatment Plan : BREAST METASTATIC Fam-Trastuzumab Deruxtecan-nxki (Enhertu) (5.4) q21d     02/07/2021 -  Chemotherapy   Patient is on Treatment Plan :  BREAST METASTATIC fam-trastuzumab deruxtecan-nxki (Enhertu) q21d    starting Enhertu on 02/07/2021  (A) echo 11/21/2020 showed an ejection fraction in the 60-65% range  (B) Enhertu changed to every 4 weeks after the second dose to allow for additional recovery time  (C) treatment held after the third dose with significant drop in the patient's functional status  (D) restaging abdominal MRI 05/14/2021    04/25/2021 Imaging    acute DVT involving the right femoral vein and other proximal veins.  The left side was benign  (A) rivaroxaban started 04/25/2021, she stopped it 07/03/2021   07/17/2021 Imaging   MRI abdomen done 07/17/2021 showed multifocal hepatic metastatic disease with similar distribution when compared to prior study using diffusion-weighted images lesions are quite similar but with perhaps slight interval decrease size and some of the smaller lesions, no gross areas of new disease in the liver   10/18/2021 Imaging   IMPRESSION: 1. Widespread metastatic disease to the liver appears generally stable compared to the prior study, with exception of a new lesion in segment 3 of the liver which measures 1.4 x 1.0 cm, as detailed above. 2. Cholelithiasis  without evidence of acute cholecystitis at this time.     11/14/2021 Imaging   We tried to biopsy the lesion that appears worse on MRI, [place] however didn't show any metastatic lesion in the left lobe of the liver. Probable improvement in metastatic disease within the right lobe of the liver based on ultrasound measurements compared to the prior MRI last month. The largest central lesion shows clear decrease in size.   03/21/2022 Imaging   IMPRESSION: 1. Targetoid hypoenhancing lesion of the left lobe of the  liver significantly increased in size. 2. An additional lesion of the central liver dome is decreased in size. 3. Multiple additional subcentimeter lesions scattered throughout the liver are not appreciably changed. 4. Findings are consistent with mixed response to treatment. 5. Cholelithiasis. 6. Coronary artery disease.     CURRENT THERAPY: Enhertu  INTERVAL HISTORY: April Rosales 81 y.o. female returns for f/u and evaluation  On Friday, October 30 she underwent CT of the abdomen with and without contrast to evaluate her liver metastasis.  This showed a targetoid hypoenhancing lesion of the left lobe of the liver significantly increased in size, an additional lesion of the central liver dome decreased in size, multiple additional subcentimeter lesions scattered throughout the liver not appreciably changed.  Findings were consistent with mixed response to treatment.  Incidentally they noted cholelithiasis and coronary artery disease along with aortic atherosclerosis.  She says after the last treatment she felt very weak, dizzy and didn't feel like she is ready for treatment yet. She is very overwhelmed today. She didn't answer calls from radiology, so she didn't have her scan and IR eval scheduled. She is very worried today, complains of abdominal pain more so in the left lumbar/iliac region, mild to moderate.  She has been taking tramadol about 2-3 times a week for aches and pains.  She wonders if she can continue to take a break till December and not start Enhertu because she is not quite feeling herself.  She has an echo scheduled for mid December but she does not have the strength to do early morning appointment so she is going to call them and schedule her to a later time if possible.   Rest of the pertinent 10 point ROS reviewed and negative  Patient Active Problem List   Diagnosis Date Noted   Acute deep vein thrombosis (DVT) of right femoral vein (HCC) 09/18/2021   Thoracic  compression fracture (HCC) 05/15/2020   Closed compression fracture of body of L1 vertebra ([place]) 10/04/2019   Aortic atherosclerosis ([place]) 09/26/2019   Neuropathy 03/27/2016   Malignant neoplasm of upper-outer quadrant of left breast in female, estrogen receptor positive ([place]) 09/11/2015   BMI 22.0-22.9, adult 03/14/2015   Metastasis to bone ([place]) 10/20/2014   Breast cancer metastasized to multiple sites ([place]) 08/28/2014   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  MEDICAL HISTORY: Past Medical History:  Diagnosis Date   Arthritis    Breast cancer [facility]) 1985/2016   takes [name] daily   Chronic back pain    stenosis/listhesis   Family history of ovarian cancer    Osteoporosis  takes Vit D   Radiation 11/23/14-12/11/14   30 Gy T1-T5     SURGICAL HISTORY: Past Surgical History:  Procedure Laterality Date   ABDOMINAL HYSTERECTOMY     1980   APPENDECTOMY  4010   APPLICATION OF INTRAOPERATIVE CT SCAN N/A 10/20/2014   Procedure: APPLICATION OF INTRAOPERATIVE CAT SCAN;  Surgeon: [name], MD;  Location: [place] [facility];  Service: Neurosurgery;  Laterality: N/A;   BREAST LUMPECTOMY WITH RADIOACTIVE SEED LOCALIZATION Left 05/03/2018   Procedure: LEFT BREAST LUMPECTOMY WITH BRACKETED RADIOACTIVE SEED LOCALIZATION;  Surgeon: [name], MD;  Location: [place];  Service: General;  Laterality: Left;   BREAST SURGERY Right 1985   THYROID CYST EXCISION  1967    SOCIAL HISTORY: Social History   Socioeconomic History   Marital status: Divorced    Spouse name: Not on file   Number of children: 1    Years of education: Not on file   Highest education level: Not on file  Occupational History   Not on file  Tobacco Use   Smoking status: Former    Packs/day: 0.50    Years: 10.00    Total pack years: 5.00    Types: Cigarettes    Quit date: 05/03/1970    Years since quitting: 52.0   Smokeless tobacco: Former   Tobacco comments:    quit smoking 1970's  Substance and Sexual Activity   Alcohol use: Yes    Alcohol/week: 0.0 standard drinks of alcohol    Comment: Rare   Drug use: No   Sexual activity: Not on file  Other Topics Concern   Not on file  Social History Narrative   Not on file   Social Determinants of Health   Financial Resource Strain: Not on file  Food Insecurity: Not on file  Transportation Needs: Not on file  Physical Activity: Not on file  Stress: Not on file  Social Connections: Not on file  Intimate Partner Violence: Not on file    FAMILY HISTORY: Family History  Problem Relation Age of Onset   Heart disease Mother    Hypertension Mother    Heart disease Father    Diabetes Father    Breast cancer Paternal Aunt        4 paternal aunts with breast cancer over 10   Prostate cancer Paternal Uncle    Stroke Paternal Grandfather    Ovarian cancer Paternal Aunt    Huntington's disease Other        Nephew, inherited from his father    Review of Systems  Constitutional:  Positive for fatigue. Negative for appetite change, chills, fever and unexpected weight change.  HENT:   Negative for hearing loss, lump/mass and trouble swallowing.   Eyes:  Negative for eye problems and icterus.  Respiratory:  Negative for chest tightness, cough and shortness of breath.   Cardiovascular:  Negative for chest pain, leg swelling and palpitations.  Gastrointestinal:  Negative for abdominal distention, abdominal pain, constipation, diarrhea, nausea and vomiting.  Endocrine: Negative for hot flashes.  Genitourinary:  Negative for difficulty urinating.   Musculoskeletal:   Negative for arthralgias.  Skin:  Negative for itching and rash.  Neurological:  Negative for dizziness, extremity weakness, headaches and numbness.  Hematological:  Negative for adenopathy. Does not bruise/bleed easily.  Psychiatric/Behavioral:  Negative for depression. The patient is not nervous/anxious.       PHYSICAL EXAMINATION  ECOG PERFORMANCE STATUS: 1 - Symptomatic but completely ambulatory  Vitals:   04/24/22 1407  BP: (!) 169/97  Pulse: (!) 129  Resp: 18  Temp: 98.2 F (36.8 C)  SpO2: 96%    Physical Exam Constitutional:      General: She is not in acute distress.    Appearance: Normal appearance. She is not toxic-appearing.  HENT:     Head: Normocephalic and atraumatic.  Eyes:     General: No scleral icterus. Cardiovascular:     Rate and Rhythm: Normal rate and regular rhythm.     Pulses: Normal pulses.     Heart sounds: Normal heart sounds.  Pulmonary:     Effort: Pulmonary effort is normal.     Breath sounds: Normal breath sounds.  Abdominal:     General: Abdomen is flat. Bowel sounds are normal. There is no distension.     Palpations: Abdomen is soft.     Tenderness: There is no abdominal tenderness.     Comments: No definitive palpable mass although there is some abnormal density in the subcutaneous fat in the area of pain.  Musculoskeletal:        General: No swelling.     Cervical back: Neck supple.  Lymphadenopathy:     Cervical: No cervical adenopathy.  Skin:    General: Skin is warm and dry.     Findings: No rash.  Neurological:     General: No focal deficit present.     Mental Status: She is alert.  Psychiatric:        Mood and Affect: Mood normal.        Behavior: Behavior normal.     LABORATORY DATA:  CBC    Component Value Date/Time   WBC 6.0 04/15/2022 1304   WBC 4.5 11/14/2021 1150   RBC 4.24 04/15/2022 1304   HGB 13.9 04/15/2022 1304   HGB 15.1 05/29/2017 1417   HCT 39.8 04/15/2022 1304   HCT 45.1 05/29/2017 1417   PLT  305 04/15/2022 1304   PLT 262 05/29/2017 1417   MCV 93.9 04/15/2022 1304   MCV 91.7 05/29/2017 1417   MCH 32.8 04/15/2022 1304   MCHC 34.9 04/15/2022 1304   RDW 14.6 04/15/2022 1304   RDW 14.1 05/29/2017 1417   LYMPHSABS 0.9 04/15/2022 1304   LYMPHSABS 1.1 05/29/2017 1417   MONOABS 0.6 04/15/2022 1304   MONOABS 0.4 05/29/2017 1417   EOSABS 0.2 04/15/2022 1304   EOSABS 0.1 05/29/2017 1417   BASOSABS 0.1 04/15/2022 1304   BASOSABS 0.1 05/29/2017 1417    CMP     Component Value Date/Time   NA 139 04/15/2022 1304   NA 141 05/29/2017 1417   K 3.1 (L) 04/15/2022 1304   K 3.6 05/29/2017 1417   CL 102 04/15/2022 1304   CO2 31 04/15/2022 1304   CO2 27 05/29/2017 1417   GLUCOSE 131 (H) 04/15/2022 1304   GLUCOSE 111 05/29/2017 1417   BUN 6 (L) 04/15/2022 1304   BUN 17.0 05/29/2017 1417   CREATININE 0.47 04/15/2022 1304   CREATININE 0.66 03/05/2020 1218   CREATININE 0.8 05/29/2017 1417   CALCIUM 10.6 (H) 04/15/2022 1304   CALCIUM 10.7 (H) 06/26/2017 1216   CALCIUM 10.4 05/29/2017 1417   PROT 6.7 04/15/2022 1304   PROT 7.3 05/29/2017 1417   ALBUMIN 3.7 04/15/2022 1304   ALBUMIN 3.9 05/29/2017 1417   AST 20 04/15/2022 1304   AST 17 05/29/2017 1417   ALT 11 04/15/2022 1304   ALT 15 05/29/2017 1417   ALKPHOS 44 04/15/2022 1304   ALKPHOS 42 05/29/2017 1417  BILITOT 0.4 04/15/2022 1304   BILITOT 0.30 05/29/2017 1417   GFRNONAA >60 04/15/2022 1304   GFRNONAA 83 03/05/2020 1218   GFRAA 97 03/05/2020 1218     ASSESSMENT and THERAPY PLAN:   No problem-specific Assessment & Plan notes found for this encounter.  Malignant neoplasm of upper-outer quadrant of left breast in female, estrogen receptor positive ([place])  81 y.o. [place] woman with stage IV left breast cancer involving liver, bone currently on Enhertu who is here for follow up. Please review history above. After his last infusion, she had back terrible fatigue, dizziness and felt that she is not really ready for  more infusions yet.  Her last imaging was concerning for progression in 1 area of the liver, rest of the disease appears stable.  We thought it could be because of the lapse in treatment.  Since this was also considered as a solitary area of progression I referred her to radiology to see if there are any local modalities of treatment.   She however did not answer the phone call, she tells me that she does not answer phone calls from unfamiliar people.   She has not missed this phone call.  She does not have a voicemail box. I encouraged her to have this rescheduled.  Will help her with rescheduling this appointment. Will defer Enhertu at this time.  I do not believe she is ready to restart it.  She will return to clinic on December 13.  She had several questions about tramadol use, Tylenol use, ongoing anxiety.  With regards to the abdominal pain, most recent imaging with no definitive evidence of worsening findings to explain this pain.  Will continue to monitor this.  I encouraged her to drink more water, increase her activity to see if this could be from slow gut motility.  She expressed understanding All questions were answered. The patient knows to call the clinic with any problems, questions or concerns. We can certainly see the patient much sooner if necessary.  Total encounter time:40 minutes*in face-to-face visit time, chart review, lab review, care coordination, order entry, and documentation of the encounter time.   *Total Encounter Time as defined by the Centers for Medicare and Medicaid Services includes, in addition to the face-to-face time of a patient visit (documented in the note above) non-face-to-face time: obtaining and reviewing outside history, ordering and reviewing medications, tests or procedures, care coordination (communications with other health care professionals or caregivers) and documentation in the medical record.

## 2022-04-24 NOTE — Telephone Encounter (Signed)
Contacted Vicky with [facility] scheduling [phone]. Scheduled patient for IR consultation with Dr. Jenetta Loges per Dr. Rob Hickman directions and order.   Scheduled patient's appt: Monday, April 28, 2022. Arrive at [facility], [place] [address], at 9:10 am.  Contacted patient with appointment information and gave her Vicky's contact info. Per Olegario Shearer, patient should call  if she needs to change any appt details.

## 2022-04-24 NOTE — Assessment & Plan Note (Signed)
81 y.o. [place] woman with stage IV left breast cancer involving liver, bone currently on Enhertu who is here for follow up. Please review history above. She continues to tolerate Enhertu very well except for some grade 1-2 fatigue and grade 1 constipation.  She had recent MRI abdomen to evaluate disease progression and this showed ongoing widespread metastatic disease to the liver appears generally stable with the exception of one new lesion which measured 1.4 x 1 cm. We tried to biopsy this lesion and she underwent ultrasound which did not show any evidence of metastatic disease in the left lobe of the liver.  Actually according to the radiologist, she had good response in the liver so far and they recommend doing a CT abdomen with liver protocol in the future for monitoring. I changed the MR abdomen to CT imaging. RTC with me in 2 weeks to follow up on imaging and to consider restarting Enhertu likely at second dose reduction if she continues to have response.

## 2022-04-28 ENCOUNTER — Other Ambulatory Visit: Payer: Self-pay | Admitting: Interventional Radiology

## 2022-04-28 ENCOUNTER — Ambulatory Visit
Admission: RE | Admit: 2022-04-28 | Discharge: 2022-04-28 | Disposition: A | Discharge status: Home / Self Care | Payer: Medicare Other | Source: Ambulatory Visit | Attending: Adult Health | Admitting: Adult Health

## 2022-04-28 ENCOUNTER — Encounter: Payer: Self-pay | Admitting: General Practice

## 2022-04-28 DIAGNOSIS — C7951 Secondary malignant neoplasm of bone: Secondary | ICD-10-CM

## 2022-04-28 DIAGNOSIS — C50412 Malignant neoplasm of upper-outer quadrant of left female breast: Secondary | ICD-10-CM

## 2022-04-28 DIAGNOSIS — Z170 Estrogen receptor positive status [ER+]: Secondary | ICD-10-CM

## 2022-04-28 DIAGNOSIS — C787 Secondary malignant neoplasm of liver and intrahepatic bile duct: Secondary | ICD-10-CM | POA: Diagnosis not present

## 2022-04-28 DIAGNOSIS — C50911 Malignant neoplasm of unspecified site of right female breast: Secondary | ICD-10-CM | POA: Diagnosis not present

## 2022-04-28 DIAGNOSIS — C50919 Malignant neoplasm of unspecified site of unspecified female breast: Secondary | ICD-10-CM

## 2022-04-28 NOTE — Progress Notes (Signed)
From: Aletta Edouard, MD  Sent: 04/28/2022   2:56 PM EST  To: Allen Kell, NT  Subject: RE: CT Biopsy Liver                            I think this could be done w/ US guidance. If Dr. Bishop Dublin wants to do it himself, sched it in CT.   GY    ----- Message -----  From: Allen Kell, NT  Sent: 04/28/2022   1:52 PM EST  To: Ir Procedure Requests  Subject: CT Biopsy Liver                                Procedure: Ct Biopsy Liver   Reason: Malignant neoplasm of upper-outer quadrant of left breast in female, estrogen receptor positive ([place]) [C50.412, Z17.0 (ICD-10-CM)]; Carcinoma of upper-outer quadrant of female breast, left ([place]) [T24.462 (ICD-10-CM)]; Estrogen receptor positive [Z17.0 (ICD-10-CM)]; Secondary malignant neoplasm of bone and bone marrow ([facility]CC) [C79.51, C79.52 (ICD-10-CM)]   History: CT in chart   Provider: Criselda Peaches, MD   Contact:[phone]

## 2022-04-28 NOTE — Consult Note (Signed)
Chief Complaint: Patient was seen in consultation today for breast cancer metastatic to the liver at the request of [place]  Referring Physician(s): Causey,Lindsey Cornetto  History of Present Illness: April Rosales is a 81 y.o. female with a history of remote right-sided breast cancer status postlumpectomy, axillary nodal dissection and radiation in 1985.  Subsequently, she developed left-sided breast cancer with evidence of multifocal metastatic disease involving the spine, lung and liver which was diagnosed in March 2016.  Most recently she has been on chemotherapy with Enhertu.  Her most recent CT imaging demonstrates focally progressive disease in the left liver.  This is a mixed response overall to therapy as her other liver lesions, lung nodules and T3 lytic lesion all remain unchanged.  She has not been tolerating her chemotherapy regimen all that well and complains of significant fatigue for 1-2 weeks following.  She lives by herself and gets up and gets dressed everyday.  She does not stay in bed.  She does feel she has been losing weight.  She does not have any family locally but does have kind neighbors.  She seems overwhelmed with her diagnosis and cancer battle.    Past Medical History:  Diagnosis Date   Arthritis    Breast cancer [facility]) 1985/2016   takes [name] daily   Chronic back pain    stenosis/listhesis   Family history of ovarian cancer    Osteoporosis    takes Vit D   Radiation 11/23/14-12/11/14   30 Gy T1-T5     Past Surgical History:  Procedure Laterality Date   ABDOMINAL HYSTERECTOMY     1980   APPENDECTOMY  1749   APPLICATION OF INTRAOPERATIVE CT SCAN N/A 10/20/2014   Procedure: APPLICATION OF INTRAOPERATIVE CAT SCAN;  Surgeon: [name], MD;  Location: [place] [facility];  Service: Neurosurgery;  Laterality: N/A;   BREAST LUMPECTOMY WITH RADIOACTIVE SEED LOCALIZATION Left 05/03/2018   Procedure: LEFT BREAST LUMPECTOMY WITH BRACKETED  RADIOACTIVE SEED LOCALIZATION;  Surgeon: [name], MD;  Location: [place];  Service: General;  Laterality: Left;   BREAST SURGERY Right 1985   THYROID CYST EXCISION  1967    Allergies: Ativan [lorazepam], Dilaudid [hydromorphone hcl], Haldol [haloperidol lactate], and Xarelto [rivaroxaban]  Medications: Prior to Admission medications   Medication Sig Start Date End Date Taking? Authorizing Provider  Cholecalciferol (VITAMIN D3 ADULT GUMMIES PO) Take 2,000 Units by mouth daily.     [provider]  Cyanocobalamin (VITAMIN B 12 PO) Take 1 tablet by mouth every other day.    [provider]  Iron-Vitamins (GERITOL PO) Take by mouth daily.    [provider]  OVER THE COUNTER MEDICATION OTC Apple Cider Vinegar 1 capsule daily.    [provider]  traMADol (ULTRAM) 50 MG tablet Take 1 tablet (50 mg total) by mouth every 8 (eight) hours as needed. Patient not taking: Reported on 03/24/2022 02/10/22   Benay Pike, MD  prochlorperazine (COMPAZINE) 10 MG tablet Take 1 tablet (10 mg total) by mouth every 6 (six) hours as needed (Nausea or vomiting). 01/31/21 01/13/22  Magrinat, [name], MD     Family History  Problem Relation Age of Onset   Heart disease Mother    Hypertension Mother    Heart disease Father    Diabetes Father    Breast cancer Paternal Aunt        4 paternal aunts with breast cancer over 42   Prostate cancer Paternal Uncle  Stroke Paternal Grandfather    Ovarian cancer Paternal Aunt    Huntington's disease Other        Nephew, inherited from his father    Social History   Socioeconomic History   Marital status: Divorced    Spouse name: Not on file   Number of children: 1   Years of education: Not on file   Highest education level: Not on file  Occupational History   Not on file  Tobacco Use   Smoking status: Former    Packs/day: 0.50    Years: 10.00    Total pack years: 5.00    Types: Cigarettes     Quit date: 05/03/1970    Years since quitting: 52.0   Smokeless tobacco: Former   Tobacco comments:    quit smoking 1970's  Substance and Sexual Activity   Alcohol use: Yes    Alcohol/week: 0.0 standard drinks of alcohol    Comment: Rare   Drug use: No   Sexual activity: Not on file  Other Topics Concern   Not on file  Social History Narrative   Not on file   Social Determinants of Health   Financial Resource Strain: Not on file  Food Insecurity: Not on file  Transportation Needs: Not on file  Physical Activity: Not on file  Stress: Not on file  Social Connections: Not on file    ECOG Status: 1 - Symptomatic but completely ambulatory  Review of Systems: A 12 point ROS discussed and pertinent positives are indicated in the HPI above.  All other systems are negative.  Review of Systems  Vital Signs: BP (!) 149/89 (BP Location: Left Arm, Patient Position: Sitting, Cuff Size: Normal)   Pulse (!) 114   Temp (!) 97.5 F (36.4 C) (Oral)   Ht '5\' 1"'$  (1.549 m)   Wt 47.2 kg   SpO2 96% Comment: room air  BMI 19.65 kg/m    Physical Exam Constitutional:      General: She is not in acute distress.    Appearance: Normal appearance.  HENT:     Head: Normocephalic and atraumatic.  Eyes:     General: No scleral icterus. Cardiovascular:     Rate and Rhythm: Normal rate.  Pulmonary:     Effort: Pulmonary effort is normal.  Abdominal:     General: Abdomen is flat.     Palpations: Abdomen is soft.  Skin:    General: Skin is warm and dry.  Neurological:     Mental Status: She is alert and oriented to person, place, and time.     Imaging: No results found.  Labs:  CBC: Recent Labs    02/10/22 1142 03/03/22 1135 03/24/22 1202 04/15/22 1304  WBC 5.8 5.2 5.1 6.0  HGB 14.5 14.3 13.9 13.9  HCT 42.2 40.5 39.9 39.8  PLT 240 258 271 305    COAGS: Recent Labs    11/14/21 1150  INR 1.0    BMP: Recent Labs    02/10/22 1142 03/03/22 1135 03/24/22 1202  04/15/22 1304  NA 140 143 140 139  K 3.9 3.7 3.3* 3.1*  CL 106 108 106 102  CO2 '27 30 30 31  '$ GLUCOSE 125* 114* 130* 131*  BUN 7* 9 8 6*  CALCIUM 10.0 10.1 9.8 10.6*  CREATININE 0.56 0.56 0.57 0.47  GFRNONAA >60 >60 >60 >60    LIVER FUNCTION TESTS: Recent Labs    02/10/22 1142 03/03/22 1135 03/24/22 1202 04/15/22 1304  BILITOT 0.4 0.4 0.4 0.4  AST '23 22 20 20  '$ ALT '11 11 11 11  '$ ALKPHOS 42 42 39 44  PROT 6.5 6.5 6.3* 6.7  ALBUMIN 3.6 3.7 3.5 3.7    TUMOR MARKERS: No results for input(s): "AFPTM", "CEA", "CA199", "CHROMGRNA" in the last 8760 hours.  Assessment and Plan:  Pleasant 81 year old female with a history of remote right-sided breast cancer status postlumpectomy, axillary nodal dissection and radiation in 1985.  Subsequently, she developed left-sided breast cancer with evidence of multifocal metastatic disease involving the spine, lung and liver which was diagnosed in March 2016.  Most recently she has been on chemotherapy with Enhertu.  Her most recent CT imaging demonstrates focally progressive disease in the left liver.  This is a mixed response overall to therapy as her other liver lesions, lung nodules and T3 lytic lesion all remain unchanged.  Given the significant difference in behavior of the left liver lesion, repeat biopsy is warranted.  Given the position of the left lobe of her liver which is relatively high and deep to the xiphoid process, I believe CT imaging may be beneficial over ultrasound imaging.  We will schedule her for a CT-guided biopsy as soon as possible.  Additionally, she has had MR imaging of the liver before which better demonstrates her multifocal liver disease.  I am uncertain if she would be a candidate for Y90.  I would like to get a repeat MRI of the abdomen to further assess her overall metastatic burden.  Unfortunately, she would not be a candidate for percutaneous ablation of the left liver lesion as it is already too large for that  therapy modality.  1.) CT guided biopsy of enlarging lesion in the left liver.  Please run breast prognostic panel.  2.) MRI abdomen with gadolinium to assess metastatic burden.  3.) F/U appointment after these two exams have been performed to discuss Y90 versus systemic therapy.   Thank you for this interesting consult.  I greatly enjoyed meeting April Rosales and look forward to participating in their care.  A copy of this report was sent to the requesting provider on this date.  Electronically Signed: Criselda Peaches 04/28/2022, 11:09 AM   I spent a total of 30 Minutes  in face to face in clinical consultation, greater than 50% of which was counseling/coordinating care for breast cancer metastatic to the liver.

## 2022-04-29 ENCOUNTER — Other Ambulatory Visit: Payer: Self-pay

## 2022-05-02 ENCOUNTER — Other Ambulatory Visit: Payer: Self-pay

## 2022-05-06 MED FILL — Dexamethasone Sodium Phosphate Inj 100 MG/10ML: INTRAMUSCULAR | Qty: 1 | Status: AC

## 2022-05-06 MED FILL — Fosaprepitant Dimeglumine For IV Infusion 150 MG (Base Eq): INTRAVENOUS | Qty: 5 | Status: AC

## 2022-05-07 ENCOUNTER — Inpatient Hospital Stay: Payer: Medicare Other | Attending: Oncology | Admitting: Hematology and Oncology

## 2022-05-07 ENCOUNTER — Other Ambulatory Visit: Payer: Self-pay

## 2022-05-07 ENCOUNTER — Inpatient Hospital Stay: Payer: Medicare Other

## 2022-05-07 ENCOUNTER — Encounter: Payer: Self-pay | Admitting: Hematology and Oncology

## 2022-05-07 DIAGNOSIS — Z87891 Personal history of nicotine dependence: Secondary | ICD-10-CM | POA: Diagnosis not present

## 2022-05-07 DIAGNOSIS — Z170 Estrogen receptor positive status [ER+]: Secondary | ICD-10-CM | POA: Diagnosis not present

## 2022-05-07 DIAGNOSIS — C787 Secondary malignant neoplasm of liver and intrahepatic bile duct: Secondary | ICD-10-CM | POA: Insufficient documentation

## 2022-05-07 DIAGNOSIS — C50412 Malignant neoplasm of upper-outer quadrant of left female breast: Secondary | ICD-10-CM | POA: Diagnosis not present

## 2022-05-07 DIAGNOSIS — C7951 Secondary malignant neoplasm of bone: Secondary | ICD-10-CM | POA: Diagnosis not present

## 2022-05-07 DIAGNOSIS — C50919 Malignant neoplasm of unspecified site of unspecified female breast: Secondary | ICD-10-CM

## 2022-05-07 DIAGNOSIS — Z853 Personal history of malignant neoplasm of breast: Secondary | ICD-10-CM | POA: Diagnosis not present

## 2022-05-07 LAB — CMP (CANCER CENTER ONLY)
ALT: 14 U/L (ref 0–44)
AST: 23 U/L (ref 15–41)
Albumin: 3.3 g/dL — ABNORMAL LOW (ref 3.5–5.0)
Alkaline Phosphatase: 50 U/L (ref 38–126)
Anion gap: 6 (ref 5–15)
BUN: 6 mg/dL — ABNORMAL LOW (ref 8–23)
CO2: 32 mmol/L (ref 22–32)
Calcium: 10.1 mg/dL (ref 8.9–10.3)
Chloride: 101 mmol/L (ref 98–111)
Creatinine: 0.5 mg/dL (ref 0.44–1.00)
GFR, Estimated: >60 mL/min (ref >60)
Glucose, Bld: 131 mg/dL — ABNORMAL HIGH (ref 70–99)
Potassium: 3 mmol/L — ABNORMAL LOW (ref 3.5–5.1)
Sodium: 139 mmol/L (ref 135–145)
Total Bilirubin: 0.5 mg/dL (ref 0.3–1.2)
Total Protein: 6.4 g/dL — ABNORMAL LOW (ref 6.5–8.1)

## 2022-05-07 LAB — CBC WITH DIFFERENTIAL (CANCER CENTER ONLY)
Abs Immature Granulocytes: 0.02 10*3/uL (ref 0.00–0.07)
Basophils Absolute: 0.1 10*3/uL (ref 0.0–0.1)
Basophils Relative: 1 %
Eosinophils Absolute: 0.1 10*3/uL (ref 0.0–0.5)
Eosinophils Relative: 1 %
HCT: 38.4 % (ref 36.0–46.0)
Hemoglobin: 13.4 g/dL (ref 12.0–15.0)
Immature Granulocytes: 0 %
Lymphocytes Relative: 10 %
Lymphs Abs: 1 10*3/uL (ref 0.7–4.0)
MCH: 32 pg (ref 26.0–34.0)
MCHC: 34.9 g/dL (ref 30.0–36.0)
MCV: 91.6 fL (ref 80.0–100.0)
Monocytes Absolute: 0.8 10*3/uL (ref 0.1–1.0)
Monocytes Relative: 8 %
Neutro Abs: 7.9 10*3/uL — ABNORMAL HIGH (ref 1.7–7.7)
Neutrophils Relative %: 80 %
Platelet Count: 423 10*3/uL — ABNORMAL HIGH (ref 150–400)
RBC: 4.19 MIL/uL (ref 3.87–5.11)
RDW: 14.1 % (ref 11.5–15.5)
WBC Count: 9.9 10*3/uL (ref 4.0–10.5)
nRBC: 0 % (ref 0.0–0.2)

## 2022-05-07 NOTE — Progress Notes (Signed)
[facility]:    [name], [name] [id number]   DIAGNOSIS:  Cancer Staging  Breast cancer metastasized to multiple sites [name]) Staging form: Breast, AJCC 7th Edition - Clinical: Stage IV ([name], [name], M1) - Signed by Chauncey Cruel, MD on 08/31/2014  Malignant neoplasm of upper-outer quadrant of left breast in female, estrogen receptor positive ([name]) Staging form: Breast, AJCC 7th Edition - Clinical: Stage IV ([name], [place], M1) - Signed by Chauncey Cruel, MD on 09/11/2015   SUMMARY OF ONCOLOGIC HISTORY: Oncology History  Breast cancer metastasized to multiple sites [name])  08/28/2014 Initial Diagnosis   Breast cancer metastasized to multiple sites ([name])   01/28/2021 -  Chemotherapy   Patient is on Treatment Plan : BREAST METASTATIC Fam-Trastuzumab Deruxtecan-nxki (Enhertu) (5.4) q21d     Metastasis to bone ([name])  10/20/2014 Initial Diagnosis   Metastasis to bone ([name])   01/28/2021 -  Chemotherapy   Patient is on Treatment Plan : BREAST METASTATIC Fam-Trastuzumab Deruxtecan-nxki (Enhertu) (5.4) q21d     10/18/2021 Imaging   IMPRESSION: 1. Widespread metastatic disease to the liver appears generally stable compared to the prior study, with exception of a new lesion in segment 3 of the liver which measures 1.4 x 1.0 cm, as detailed above. 2. Cholelithiasis without evidence of acute cholecystitis at this time.     Malignant neoplasm of upper-outer quadrant of left breast in female, estrogen receptor positive ([name])  09/02/1983 Surgery   status post right lumpectomy and axillary node dissection in 1985 followed by radiation at [facility], exact date not clear.   08/16/2014 Imaging   METASTATIC DISEASE 08/16/2014: measurable disease in spine, lung and left breast   (2) evaluation for left shoulder pain led to thoracic spine MRI 08/16/2014 showing a pathologic fracture at T3 with epidural tumor displacing the cord to the  right, but no cord compression. CT scans of the chest, abdomen and pelvis 08/24/2014 showed in addition a mass in the upper outer quadrant left breast measuring 1.6 cm and a nodule in the minor fissure of the right lung measuring 1.2 cm, but no parenchymal lung or liver lesions.   (a) CA 27-29 was noninformative at 38 (09/20/2014)   08/29/2014 Mammogram   mammography and ultrasonography 08/29/2014 show a mass in the upper inner left breast which was palpable,  measuring 2.0 cm by ultrasound. Biopsy of this mass 08/29/2014 showed an invasive breast cancer with both lobular and ductal features, estrogen receptor positive, progesterone receptor weakly positive, with an MIB-1 in the 40% range, HER-2 equivocal (6 else ratio 1.5, but average number her nucleus 5.8    08/31/2014 - 05/29/2017 Anti-estrogen oral therapy   letrozole started 08/31/2014;   (a) palbociclib added Sept 2016 at 75 mg 21/7, with significant neutropenia; not repeated after first cycle  (b) letrozole discontinued 05/29/2017 with evidence of disease progression in the breast   10/20/2014 Surgery   10/20/2014 the patient underwent T2-T3 and T4 decompressive laminectomy with removal of epidural tumor, C7-T4 segmental pedicle screw instrumentation with virage screw system with arrow guidance protocol and C7-T4 posterolateral fusion. The cells were positive for the estrogen receptor. HER-2/neu testing by [name] showed again equivocal results,   11/23/2014 - 12/11/2014 Radiation Therapy   11/23/2014-12/11/2014.  (a) T1-T5 was treated to 30 Gy in 12 fractions at 2.5 Gy per fraction    09/11/2015 Initial Diagnosis   Malignant neoplasm of upper-outer quadrant of left breast in female, estrogen  receptor positive ([name])   05/29/2017 - 06/21/2018 Anti-estrogen oral therapy   fulvestrant started 05/29/2017, last dose 07/22/2018 (discontinued due to pandemic).  (a) started palbociclib 06/29/2017 at 75 mg every other day for 21 days on, 7 days off  (b)  palbociclib discontinued on 3/29 due to progressive fatigue and patient preference   05/04/2018 Surgery   status post left lumpectomy 05/03/2018 showing a pT1b pNX invasive ductal carcinoma, grade 2, with equivocal HER-2 results  (a) opted against adjuvant radiation given presence of stage IV disease   11/02/2018 PET scan   PET scan 11/02/2018 showed no active disease  (a) [place]na scan on 10/31/2019 shows activity in 2 liver lesions, no active bone or lung lesions  (b) abdominal ultrasound 11/08/2019 confirms 2 liver lesions measuring 4.4 and 2.1 cm.  (c) biopsy of 1 of the liver lesions 12/12/2019 confirmed metastatic breast cancer, estrogen and progesterone receptor positive, with an MIB-1 of 10%, and HER-2 positive, with a copy number of 2.12 and the number per cell 6.25  (d) abdominal ultrasound 03/12/2020 shows one of the liver lesions to have increased, while the second lesion has decreased   01/25/2019 - 12/02/2019 Anti-estrogen oral therapy    letrozole started 02/15/2019, discontinued 12/02/2019 with evidence of progression  (a) bone density 09/12/2016 showed a T score of -4.8.    12/08/2019 - 01/24/2021 Anti-estrogen oral therapy   fulvestrant resumed 12/08/2019  (a) palbociclib added at 75 mg daily 21/7 starting 12/20/2019   (b) fulvestrant and palbociclib discontinued September 2022 with evidence of progression.   04/25/2020 -  Antibody Plan   trastuzumab started 04/25/2020, repeat every 28 days  (a) echo 03/12/2020 shows an ejection fraction in the 60-65% range  (b) changed to subQ formulation of trastuzumab 06/20/2020  (c) trastuzumab discontinued September 2022 with MRI of the abdomen showing evidence of progression    Genetic Testing   genetics testing using the Breast/Ovarian Cancer Panel through [facility] [name], MD) found no deleterious mutations in ATM, BARD1, BRCA1, BRCA2, BRIP1, CDH1, CHEK2, EPCAM, FANCC, MLH1, MSH2, MSH6, NBN, PALB2, PMS2, PTEN, RAD51C, RAD51D, STK11,  TP53, or XRCC2      01/28/2021 -  Chemotherapy   Patient is on Treatment Plan : BREAST METASTATIC Fam-Trastuzumab Deruxtecan-nxki (Enhertu) (5.4) q21d     02/07/2021 -  Chemotherapy   Patient is on Treatment Plan :  BREAST METASTATIC fam-trastuzumab deruxtecan-nxki (Enhertu) q21d    starting Enhertu on 02/07/2021  (A) echo 11/21/2020 showed an ejection fraction in the 60-65% range  (B) Enhertu changed to every 4 weeks after the second dose to allow for additional recovery time  (C) treatment held after the third dose with significant drop in the patient's functional status  (D) restaging abdominal MRI 05/14/2021    04/25/2021 Imaging    acute DVT involving the right femoral vein and other proximal veins.  The left side was benign  (A) rivaroxaban started 04/25/2021, she stopped it 07/03/2021   07/17/2021 Imaging   MRI abdomen done 07/17/2021 showed multifocal hepatic metastatic disease with similar distribution when compared to prior study using diffusion-weighted images lesions are quite similar but with perhaps slight interval decrease size and some of the smaller lesions, no gross areas of new disease in the liver   10/18/2021 Imaging   IMPRESSION: 1. Widespread metastatic disease to the liver appears generally stable compared to the prior study, with exception of a new lesion in segment 3 of the liver which measures 1.4 x 1.0 cm, as detailed above. 2. Cholelithiasis  without evidence of acute cholecystitis at this time.     11/14/2021 Imaging   We tried to biopsy the lesion that appears worse on MRI, [place] however didn't show any metastatic lesion in the left lobe of the liver. Probable improvement in metastatic disease within the right lobe of the liver based on ultrasound measurements compared to the prior MRI last month. The largest central lesion shows clear decrease in size.   03/21/2022 Imaging   IMPRESSION: 1. Targetoid hypoenhancing lesion of the left lobe of the  liver significantly increased in size. 2. An additional lesion of the central liver dome is decreased in size. 3. Multiple additional subcentimeter lesions scattered throughout the liver are not appreciably changed. 4. Findings are consistent with mixed response to treatment. 5. Cholelithiasis. 6. Coronary artery disease.     CURRENT THERAPY: Enhertu  INTERVAL HISTORY: April Rosales 81 y.o. female returns for f/u and evaluation  On Friday, October 30 she underwent CT of the abdomen with and without contrast to evaluate her liver metastasis.  This showed a targetoid hypoenhancing lesion of the left lobe of the liver significantly increased in size, an additional lesion of the central liver dome decreased in size, multiple additional subcentimeter lesions scattered throughout the liver not appreciably changed.  Findings were consistent with mixed response to treatment.  Incidentally they noted cholelithiasis and coronary artery disease along with aortic atherosclerosis.  She wanted to take a break from treatment. Last treatment with Enhertu on 10/30. Today she goes on and on about how she will make it to the MRI appointments, she tells me that she doesn't have anyone to bring her and she just kept worrying about this during most of the conversation. She also doesn't feel well at all, she says this last infusion has taken a toll in her.  She doesn't feel strong at all, doesn't have good social support. She has younger sister who is not well.  She has a son but has not spoken to him in almost 10 years.   She is understandably worried about treatment and side effects. Besides back pain and fatigue, she denies any symptoms to me.  She is very anxious and stressed today. Rest of the pertinent 10 point ROS reviewed and negative  Patient Active Problem List   Diagnosis Date Noted   Acute deep vein thrombosis (DVT) of right femoral vein (HCC) 09/18/2021   Thoracic compression fracture (H[facility])  05/15/2020   Closed compression fracture of body of L1 vertebra ([place]) 10/04/2019   Aortic atherosclerosis ([place]) 09/26/2019   Neuropathy 03/27/2016   Malignant neoplasm of upper-outer quadrant of left breast in female, estrogen receptor positive ([place]) 09/11/2015   BMI 22.0-22.9, adult 03/14/2015   Metastasis to bone ([place]) 10/20/2014   Breast cancer metastasized to multiple sites ([place]) 08/28/2014   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  MEDICAL HISTORY: Past Medical History:  Diagnosis Date   Arthritis    Breast cancer [facility]) 1985/2016   takes [name] daily   Chronic back pain    stenosis/listhesis   Family history of ovarian cancer    Osteoporosis    takes Vit D  Radiation 11/23/14-12/11/14   30 Gy T1-T5     SURGICAL HISTORY: Past Surgical History:  Procedure Laterality Date   ABDOMINAL HYSTERECTOMY     1980   APPENDECTOMY  0277   APPLICATION OF INTRAOPERATIVE CT SCAN N/A 10/20/2014   Procedure: APPLICATION OF INTRAOPERATIVE CAT SCAN;  Surgeon: [name], MD;  Location: [place] [facility];  Service: Neurosurgery;  Laterality: N/A;   BREAST LUMPECTOMY WITH RADIOACTIVE SEED LOCALIZATION Left 05/03/2018   Procedure: LEFT BREAST LUMPECTOMY WITH BRACKETED RADIOACTIVE SEED LOCALIZATION;  Surgeon: [name], MD;  Location: [place];  Service: General;  Laterality: Left;   BREAST SURGERY Right 1985   THYROID CYST EXCISION  1967    SOCIAL HISTORY: Social History   Socioeconomic History   Marital status: Divorced    Spouse name: Not on file   Number of children: 1   Years of education: Not on file    Highest education level: Not on file  Occupational History   Not on file  Tobacco Use   Smoking status: Former    Packs/day: 0.50    Years: 10.00    Total pack years: 5.00    Types: Cigarettes    Quit date: 05/03/1970    Years since quitting: 52.0   Smokeless tobacco: Former   Tobacco comments:    quit smoking 1970's  Substance and Sexual Activity   Alcohol use: Yes    Alcohol/week: 0.0 standard drinks of alcohol    Comment: Rare   Drug use: No   Sexual activity: Not on file  Other Topics Concern   Not on file  Social History Narrative   Not on file   Social Determinants of Health   Financial Resource Strain: Not on file  Food Insecurity: Not on file  Transportation Needs: Not on file  Physical Activity: Not on file  Stress: Not on file  Social Connections: Not on file  Intimate Partner Violence: Not on file    FAMILY HISTORY: Family History  Problem Relation Age of Onset   Heart disease Mother    Hypertension Mother    Heart disease Father    Diabetes Father    Breast cancer Paternal Aunt        4 paternal aunts with breast cancer over 36   Prostate cancer Paternal Uncle    Stroke Paternal Grandfather    Ovarian cancer Paternal Aunt    Huntington's disease Other        Nephew, inherited from his father    Review of Systems  Constitutional:  Positive for fatigue. Negative for appetite change, chills, fever and unexpected weight change.  HENT:   Negative for hearing loss, lump/mass and trouble swallowing.   Eyes:  Negative for eye problems and icterus.  Respiratory:  Negative for chest tightness, cough and shortness of breath.   Cardiovascular:  Negative for chest pain, leg swelling and palpitations.  Gastrointestinal:  Negative for abdominal distention, abdominal pain, constipation, diarrhea, nausea and vomiting.  Endocrine: Negative for hot flashes.  Genitourinary:  Negative for difficulty urinating.   Musculoskeletal:  Negative for arthralgias.  Skin:   Negative for itching and rash.  Neurological:  Negative for dizziness, extremity weakness, headaches and numbness.  Hematological:  Negative for adenopathy. Does not bruise/bleed easily.  Psychiatric/Behavioral:  Negative for depression. The patient is not nervous/anxious.       PHYSICAL EXAMINATION  ECOG PERFORMANCE STATUS: 1 - Symptomatic but completely ambulatory  Vitals:   05/07/22 1300  BP: (!) 160/82  Pulse: (!) 116  Resp: 16  Temp: 97.7 F (36.5 C)  SpO2: 97%   Physical exam deferred today She appears very anxious and stressed  No swelling of lower extremities   LABORATORY DATA:  CBC    Component Value Date/Time   WBC 9.9 05/07/2022 1229   WBC 4.5 11/14/2021 1150   RBC 4.19 05/07/2022 1229   HGB 13.4 05/07/2022 1229   HGB 15.1 05/29/2017 1417   HCT 38.4 05/07/2022 1229   HCT 45.1 05/29/2017 1417   PLT 423 (H) 05/07/2022 1229   PLT 262 05/29/2017 1417   MCV 91.6 05/07/2022 1229   MCV 91.7 05/29/2017 1417   MCH 32.0 05/07/2022 1229   MCHC 34.9 05/07/2022 1229   RDW 14.1 05/07/2022 1229   RDW 14.1 05/29/2017 1417   LYMPHSABS 1.0 05/07/2022 1229   LYMPHSABS 1.1 05/29/2017 1417   MONOABS 0.8 05/07/2022 1229   MONOABS 0.4 05/29/2017 1417   EOSABS 0.1 05/07/2022 1229   EOSABS 0.1 05/29/2017 1417   BASOSABS 0.1 05/07/2022 1229   BASOSABS 0.1 05/29/2017 1417    CMP     Component Value Date/Time   NA 139 04/15/2022 1304   NA 141 05/29/2017 1417   K 3.1 (L) 04/15/2022 1304   K 3.6 05/29/2017 1417   CL 102 04/15/2022 1304   CO2 31 04/15/2022 1304   CO2 27 05/29/2017 1417   GLUCOSE 131 (H) 04/15/2022 1304   GLUCOSE 111 05/29/2017 1417   BUN 6 (L) 04/15/2022 1304   BUN 17.0 05/29/2017 1417   CREATININE 0.47 04/15/2022 1304   CREATININE 0.66 03/05/2020 1218   CREATININE 0.8 05/29/2017 1417   CALCIUM 10.6 (H) 04/15/2022 1304   CALCIUM 10.7 (H) 06/26/2017 1216   CALCIUM 10.4 05/29/2017 1417   PROT 6.7 04/15/2022 1304   PROT 7.3 05/29/2017 1417    ALBUMIN 3.7 04/15/2022 1304   ALBUMIN 3.9 05/29/2017 1417   AST 20 04/15/2022 1304   AST 17 05/29/2017 1417   ALT 11 04/15/2022 1304   ALT 15 05/29/2017 1417   ALKPHOS 44 04/15/2022 1304   ALKPHOS 42 05/29/2017 1417   BILITOT 0.4 04/15/2022 1304   BILITOT 0.30 05/29/2017 1417   GFRNONAA >60 04/15/2022 1304   GFRNONAA 83 03/05/2020 1218   GFRAA 97 03/05/2020 1218     ASSESSMENT and THERAPY PLAN:   No problem-specific Assessment & Plan notes found for this encounter.  Malignant neoplasm of upper-outer quadrant of left breast in female, estrogen receptor positive ([place])  81 y.o. [place] woman with stage IV left breast cancer involving liver, bone currently on Enhertu who is here for follow up. Please review history above. After his last infusion, she had terrible fatigue, dizziness and felt that she is not really ready for more infusions yet.  Her last imaging was concerning for progression in 1 area of the liver, rest of the disease appears stable.  We thought it could be because of the lapse in treatment.  Since this was also considered as a solitary area of progression I referred her to radiology to see if there are any local modalities of treatment.   She however did not answer the phone call, she tells me that she does not answer phone calls from unfamiliar people.   She finally saw Dr. Laurence Ferrari in Interventional radiology has MRI and ultrasound-guided biopsy scheduled however she goes today about how she cannot make it to the biopsy.  We have discussed unfortunately that this is important because of information for  [place] to recommend. I have also discussed about goals of care today.  She is without much social support and has slowly deteriorated status 10 point not a curable cancer and if she becomes too weak to tolerate treatments, we may have to discuss goals of care and consider keeping her comfortable.  She understands and she is agreeable.  She will return to clinic in first week  of January hopefully after the biopsy and the MRI for review next line of treatment.  Since she is very weak today, we have agreed to defer treatment in her best interest.  Total time spent: 35 minutes *Total Encounter Time as defined by the Centers for Medicare and Medicaid Services includes, in addition to the face-to-face time of a patient visit (documented in the note above) non-face-to-face time: obtaining and reviewing outside history, ordering and reviewing medications, tests or procedures, care coordination (communications with other health care professionals or caregivers) and documentation in the medical record.

## 2022-05-08 ENCOUNTER — Telehealth: Payer: Self-pay | Admitting: *Deleted

## 2022-05-08 NOTE — Telephone Encounter (Signed)
Per Wilber Bihari, NP, called pt with message message below. Advised on potassium rich foods and drinks. Pt verbalized understanding.

## 2022-05-08 NOTE — Telephone Encounter (Signed)
-----   Message from Gardenia Phlegm, NP sent at 05/08/2022  8:43 AM EST ----- Potassium is decreased, please f/u with patient and recommend potassium rich diet ----- Message ----- From: Interface, Lab In [place] Sent: 05/07/2022  12:43 PM EST To: Gardenia Phlegm, NP

## 2022-05-12 ENCOUNTER — Other Ambulatory Visit: Payer: Self-pay | Admitting: Hematology and Oncology

## 2022-05-12 ENCOUNTER — Other Ambulatory Visit: Payer: Self-pay | Admitting: *Deleted

## 2022-05-12 DIAGNOSIS — C50412 Malignant neoplasm of upper-outer quadrant of left female breast: Secondary | ICD-10-CM

## 2022-05-12 DIAGNOSIS — C50919 Malignant neoplasm of unspecified site of unspecified female breast: Secondary | ICD-10-CM

## 2022-05-12 MED ORDER — TRAMADOL HCL 50 MG PO TABS: 50.0000 mg | ORAL_TABLET | Freq: Three times a day (TID) | ORAL | 0 refills | Status: DC | PRN

## 2022-05-12 NOTE — Progress Notes (Signed)
Tramadol refilled per request.

## 2022-05-12 NOTE — Telephone Encounter (Signed)
Contacted patient to inform refill sent to pharmacy - patient verbalized understanding

## 2022-05-13 ENCOUNTER — Ambulatory Visit (HOSPITAL_COMMUNITY): Admission: RE | Admit: 2022-05-13 | Payer: Medicare Other | Source: Ambulatory Visit

## 2022-05-15 ENCOUNTER — Encounter (HOSPITAL_COMMUNITY): Payer: Medicare Other | Admitting: Internal Medicine

## 2022-05-15 ENCOUNTER — Ambulatory Visit (HOSPITAL_COMMUNITY): Payer: Medicare Other

## 2022-05-20 ENCOUNTER — Other Ambulatory Visit: Payer: Self-pay | Admitting: Radiology

## 2022-05-20 DIAGNOSIS — R160 Hepatomegaly, not elsewhere classified: Secondary | ICD-10-CM

## 2022-05-21 ENCOUNTER — Ambulatory Visit (HOSPITAL_COMMUNITY)
Admission: RE | Admit: 2022-05-21 | Discharge: 2022-05-21 | Disposition: A | Discharge status: Home / Self Care | Payer: Medicare Other | Source: Ambulatory Visit | Attending: Interventional Radiology | Admitting: Interventional Radiology

## 2022-05-21 DIAGNOSIS — Z170 Estrogen receptor positive status [ER+]: Secondary | ICD-10-CM | POA: Diagnosis not present

## 2022-05-21 DIAGNOSIS — C7951 Secondary malignant neoplasm of bone: Secondary | ICD-10-CM | POA: Insufficient documentation

## 2022-05-21 DIAGNOSIS — C50412 Malignant neoplasm of upper-outer quadrant of left female breast: Secondary | ICD-10-CM | POA: Insufficient documentation

## 2022-05-21 DIAGNOSIS — C7952 Secondary malignant neoplasm of bone marrow: Secondary | ICD-10-CM | POA: Diagnosis not present

## 2022-05-21 DIAGNOSIS — Z8505 Personal history of malignant neoplasm of liver: Secondary | ICD-10-CM | POA: Diagnosis not present

## 2022-05-21 DIAGNOSIS — C50919 Malignant neoplasm of unspecified site of unspecified female breast: Secondary | ICD-10-CM | POA: Diagnosis not present

## 2022-05-21 DIAGNOSIS — K769 Liver disease, unspecified: Secondary | ICD-10-CM | POA: Diagnosis not present

## 2022-05-21 DIAGNOSIS — C787 Secondary malignant neoplasm of liver and intrahepatic bile duct: Secondary | ICD-10-CM | POA: Diagnosis not present

## 2022-05-21 MED ORDER — GADOBUTROL 1 MMOL/ML IV SOLN
5.0000 mL | Freq: Once | INTRAVENOUS | Status: AC | PRN
  Administered 2022-05-21: 5 mL via INTRAVENOUS

## 2022-05-22 ENCOUNTER — Telehealth: Payer: Self-pay | Admitting: Hematology and Oncology

## 2022-05-22 ENCOUNTER — Ambulatory Visit (HOSPITAL_COMMUNITY): Payer: Medicare Other

## 2022-05-22 NOTE — Telephone Encounter (Signed)
Scheduled appointments per WQ. Patient is aware. 

## 2022-05-23 ENCOUNTER — Other Ambulatory Visit: Payer: Self-pay

## 2022-05-27 ENCOUNTER — Ambulatory Visit: Payer: Medicare Other

## 2022-05-27 ENCOUNTER — Other Ambulatory Visit: Payer: Medicare Other

## 2022-05-27 ENCOUNTER — Ambulatory Visit: Payer: Medicare Other | Admitting: Adult Health

## 2022-05-27 MED FILL — Fosaprepitant Dimeglumine For IV Infusion 150 MG (Base Eq): INTRAVENOUS | Qty: 5 | Status: AC

## 2022-05-27 MED FILL — Dexamethasone Sodium Phosphate Inj 100 MG/10ML: INTRAMUSCULAR | Qty: 1 | Status: AC

## 2022-05-28 ENCOUNTER — Other Ambulatory Visit: Payer: Self-pay

## 2022-05-28 ENCOUNTER — Telehealth: Payer: Self-pay | Admitting: *Deleted

## 2022-05-28 ENCOUNTER — Encounter: Payer: Self-pay | Admitting: Hematology and Oncology

## 2022-05-28 ENCOUNTER — Inpatient Hospital Stay (HOSPITAL_BASED_OUTPATIENT_CLINIC_OR_DEPARTMENT_OTHER): Payer: Medicare Other | Admitting: Hematology and Oncology

## 2022-05-28 ENCOUNTER — Inpatient Hospital Stay: Payer: Medicare Other | Attending: Oncology

## 2022-05-28 ENCOUNTER — Inpatient Hospital Stay: Payer: Medicare Other

## 2022-05-28 DIAGNOSIS — Z87891 Personal history of nicotine dependence: Secondary | ICD-10-CM | POA: Diagnosis not present

## 2022-05-28 DIAGNOSIS — R42 Dizziness and giddiness: Secondary | ICD-10-CM | POA: Insufficient documentation

## 2022-05-28 DIAGNOSIS — C50412 Malignant neoplasm of upper-outer quadrant of left female breast: Secondary | ICD-10-CM

## 2022-05-28 DIAGNOSIS — C7951 Secondary malignant neoplasm of bone: Secondary | ICD-10-CM

## 2022-05-28 DIAGNOSIS — Z170 Estrogen receptor positive status [ER+]: Secondary | ICD-10-CM | POA: Diagnosis not present

## 2022-05-28 DIAGNOSIS — C50919 Malignant neoplasm of unspecified site of unspecified female breast: Secondary | ICD-10-CM

## 2022-05-28 DIAGNOSIS — R339 Retention of urine, unspecified: Secondary | ICD-10-CM | POA: Diagnosis not present

## 2022-05-28 DIAGNOSIS — C787 Secondary malignant neoplasm of liver and intrahepatic bile duct: Secondary | ICD-10-CM | POA: Diagnosis not present

## 2022-05-28 LAB — CMP (CANCER CENTER ONLY)
ALT: 17 U/L (ref 0–44)
AST: 24 U/L (ref 15–41)
Albumin: 3.2 g/dL — ABNORMAL LOW (ref 3.5–5.0)
Alkaline Phosphatase: 62 U/L (ref 38–126)
Anion gap: 6 (ref 5–15)
BUN: 8 mg/dL (ref 8–23)
CO2: 30 mmol/L (ref 22–32)
Calcium: 9.8 mg/dL (ref 8.9–10.3)
Chloride: 99 mmol/L (ref 98–111)
Creatinine: 0.45 mg/dL (ref 0.44–1.00)
GFR, Estimated: >60 mL/min (ref >60)
Glucose, Bld: 126 mg/dL — ABNORMAL HIGH (ref 70–99)
Potassium: 3.1 mmol/L — ABNORMAL LOW (ref 3.5–5.1)
Sodium: 135 mmol/L (ref 135–145)
Total Bilirubin: 0.5 mg/dL (ref 0.3–1.2)
Total Protein: 6.4 g/dL — ABNORMAL LOW (ref 6.5–8.1)

## 2022-05-28 LAB — CBC WITH DIFFERENTIAL (CANCER CENTER ONLY)
Abs Immature Granulocytes: 0.04 10*3/uL (ref 0.00–0.07)
Basophils Absolute: 0.1 10*3/uL (ref 0.0–0.1)
Basophils Relative: 1 %
Eosinophils Absolute: 0.1 10*3/uL (ref 0.0–0.5)
Eosinophils Relative: 0 %
HCT: 37.7 % (ref 36.0–46.0)
Hemoglobin: 13 g/dL (ref 12.0–15.0)
Immature Granulocytes: 0 %
Lymphocytes Relative: 7 %
Lymphs Abs: 0.9 10*3/uL (ref 0.7–4.0)
MCH: 31.7 pg (ref 26.0–34.0)
MCHC: 34.5 g/dL (ref 30.0–36.0)
MCV: 92 fL (ref 80.0–100.0)
Monocytes Absolute: 1.1 10*3/uL — ABNORMAL HIGH (ref 0.1–1.0)
Monocytes Relative: 9 %
Neutro Abs: 10.5 10*3/uL — ABNORMAL HIGH (ref 1.7–7.7)
Neutrophils Relative %: 83 %
Platelet Count: 450 10*3/uL — ABNORMAL HIGH (ref 150–400)
RBC: 4.1 MIL/uL (ref 3.87–5.11)
RDW: 13.5 % (ref 11.5–15.5)
WBC Count: 12.6 10*3/uL — ABNORMAL HIGH (ref 4.0–10.5)
nRBC: 0 % (ref 0.0–0.2)

## 2022-05-28 NOTE — Telephone Encounter (Signed)
Per verbal order from Dr. Chryl Heck, contacted [facility]/Hospice.  Referral information given verbally to [facility] w/[facility] and Hospice referral placed in EPIC.  Informed patient and sister (in office) that Hospice referral made and patient will be contacted by [facility] .

## 2022-05-28 NOTE — Progress Notes (Signed)
[facility]:    [name], [name] [id number]   DIAGNOSIS:  Cancer Staging  Breast cancer metastasized to multiple sites [name]) Staging form: Breast, AJCC 7th Edition - Clinical: Stage IV ([name], [name], M1) - Signed by Chauncey Cruel, MD on 08/31/2014  Malignant neoplasm of upper-outer quadrant of left breast in female, estrogen receptor positive ([name]) Staging form: Breast, AJCC 7th Edition - Clinical: Stage IV ([name], [place], M1) - Signed by Chauncey Cruel, MD on 09/11/2015   SUMMARY OF ONCOLOGIC HISTORY: Oncology History  Breast cancer metastasized to multiple sites [name])  08/28/2014 Initial Diagnosis   Breast cancer metastasized to multiple sites ([name])   01/28/2021 -  Chemotherapy   Patient is on Treatment Plan : BREAST METASTATIC Fam-Trastuzumab Deruxtecan-nxki (Enhertu) (5.4) q21d     Metastasis to bone ([name])  10/20/2014 Initial Diagnosis   Metastasis to bone ([name])   01/28/2021 -  Chemotherapy   Patient is on Treatment Plan : BREAST METASTATIC Fam-Trastuzumab Deruxtecan-nxki (Enhertu) (5.4) q21d     10/18/2021 Imaging   IMPRESSION: 1. Widespread metastatic disease to the liver appears generally stable compared to the prior study, with exception of a new lesion in segment 3 of the liver which measures 1.4 x 1.0 cm, as detailed above. 2. Cholelithiasis without evidence of acute cholecystitis at this time.     Malignant neoplasm of upper-outer quadrant of left breast in female, estrogen receptor positive ([name])  09/02/1983 Surgery   status post right lumpectomy and axillary node dissection in 1985 followed by radiation at [facility], exact date not clear.   08/16/2014 Imaging   METASTATIC DISEASE 08/16/2014: measurable disease in spine, lung and left breast   (2) evaluation for left shoulder pain led to thoracic spine MRI 08/16/2014 showing a pathologic fracture at T3 with epidural tumor displacing the cord to the  right, but no cord compression. CT scans of the chest, abdomen and pelvis 08/24/2014 showed in addition a mass in the upper outer quadrant left breast measuring 1.6 cm and a nodule in the minor fissure of the right lung measuring 1.2 cm, but no parenchymal lung or liver lesions.   (a) CA 27-29 was noninformative at 38 (09/20/2014)   08/29/2014 Mammogram   mammography and ultrasonography 08/29/2014 show a mass in the upper inner left breast which was palpable,  measuring 2.0 cm by ultrasound. Biopsy of this mass 08/29/2014 showed an invasive breast cancer with both lobular and ductal features, estrogen receptor positive, progesterone receptor weakly positive, with an MIB-1 in the 40% range, HER-2 equivocal (6 else ratio 1.5, but average number her nucleus 5.8    08/31/2014 - 05/29/2017 Anti-estrogen oral therapy   letrozole started 08/31/2014;   (a) palbociclib added Sept 2016 at 75 mg 21/7, with significant neutropenia; not repeated after first cycle  (b) letrozole discontinued 05/29/2017 with evidence of disease progression in the breast   10/20/2014 Surgery   10/20/2014 the patient underwent T2-T3 and T4 decompressive laminectomy with removal of epidural tumor, C7-T4 segmental pedicle screw instrumentation with virage screw system with arrow guidance protocol and C7-T4 posterolateral fusion. The cells were positive for the estrogen receptor. HER-2/neu testing by [name] showed again equivocal results,   11/23/2014 - 12/11/2014 Radiation Therapy   11/23/2014-12/11/2014.  (a) T1-T5 was treated to 30 Gy in 12 fractions at 2.5 Gy per fraction    09/11/2015 Initial Diagnosis   Malignant neoplasm of upper-outer quadrant of left breast in female, estrogen  receptor positive ([name])   05/29/2017 - 06/21/2018 Anti-estrogen oral therapy   fulvestrant started 05/29/2017, last dose 07/22/2018 (discontinued due to pandemic).  (a) started palbociclib 06/29/2017 at 75 mg every other day for 21 days on, 7 days off  (b)  palbociclib discontinued on 3/29 due to progressive fatigue and patient preference   05/04/2018 Surgery   status post left lumpectomy 05/03/2018 showing a pT1b pNX invasive ductal carcinoma, grade 2, with equivocal HER-2 results  (a) opted against adjuvant radiation given presence of stage IV disease   11/02/2018 PET scan   PET scan 11/02/2018 showed no active disease  (a) [place]ianna scan on 10/31/2019 shows activity in 2 liver lesions, no active bone or lung lesions  (b) abdominal ultrasound 11/08/2019 confirms 2 liver lesions measuring 4.4 and 2.1 cm.  (c) biopsy of 1 of the liver lesions 12/12/2019 confirmed metastatic breast cancer, estrogen and progesterone receptor positive, with an MIB-1 of 10%, and HER-2 positive, with a copy number of 2.12 and the number per cell 6.25  (d) abdominal ultrasound 03/12/2020 shows one of the liver lesions to have increased, while the second lesion has decreased   01/25/2019 - 12/02/2019 Anti-estrogen oral therapy    letrozole started 02/15/2019, discontinued 12/02/2019 with evidence of progression  (a) bone density 09/12/2016 showed a T score of -4.8.    12/08/2019 - 01/24/2021 Anti-estrogen oral therapy   fulvestrant resumed 12/08/2019  (a) palbociclib added at 75 mg daily 21/7 starting 12/20/2019   (b) fulvestrant and palbociclib discontinued September 2022 with evidence of progression.   04/25/2020 -  Antibody Plan   trastuzumab started 04/25/2020, repeat every 28 days  (a) echo 03/12/2020 shows an ejection fraction in the 60-65% range  (b) changed to subQ formulation of trastuzumab 06/20/2020  (c) trastuzumab discontinued September 2022 with MRI of the abdomen showing evidence of progression    Genetic Testing   genetics testing using the Breast/Ovarian Cancer Panel through [facility] [name], MD) found no deleterious mutations in ATM, BARD1, BRCA1, BRCA2, BRIP1, CDH1, CHEK2, EPCAM, FANCC, MLH1, MSH2, MSH6, NBN, PALB2, PMS2, PTEN, RAD51C, RAD51D, STK11,  TP53, or XRCC2      01/28/2021 -  Chemotherapy   Patient is on Treatment Plan : BREAST METASTATIC Fam-Trastuzumab Deruxtecan-nxki (Enhertu) (5.4) q21d     02/07/2021 -  Chemotherapy   Patient is on Treatment Plan :  BREAST METASTATIC fam-trastuzumab deruxtecan-nxki (Enhertu) q21d    starting Enhertu on 02/07/2021  (A) echo 11/21/2020 showed an ejection fraction in the 60-65% range  (B) Enhertu changed to every 4 weeks after the second dose to allow for additional recovery time  (C) treatment held after the third dose with significant drop in the patient's functional status  (D) restaging abdominal MRI 05/14/2021    04/25/2021 Imaging    acute DVT involving the right femoral vein and other proximal veins.  The left side was benign  (A) rivaroxaban started 04/25/2021, she stopped it 07/03/2021   07/17/2021 Imaging   MRI abdomen done 07/17/2021 showed multifocal hepatic metastatic disease with similar distribution when compared to prior study using diffusion-weighted images lesions are quite similar but with perhaps slight interval decrease size and some of the smaller lesions, no gross areas of new disease in the liver   10/18/2021 Imaging   IMPRESSION: 1. Widespread metastatic disease to the liver appears generally stable compared to the prior study, with exception of a new lesion in segment 3 of the liver which measures 1.4 x 1.0 cm, as detailed above. 2. Cholelithiasis  without evidence of acute cholecystitis at this time.     11/14/2021 Imaging   We tried to biopsy the lesion that appears worse on MRI, [place] however didn't show any metastatic lesion in the left lobe of the liver. Probable improvement in metastatic disease within the right lobe of the liver based on ultrasound measurements compared to the prior MRI last month. The largest central lesion shows clear decrease in size.   03/21/2022 Imaging   IMPRESSION: 1. Targetoid hypoenhancing lesion of the left lobe of the  liver significantly increased in size. 2. An additional lesion of the central liver dome is decreased in size. 3. Multiple additional subcentimeter lesions scattered throughout the liver are not appreciably changed. 4. Findings are consistent with mixed response to treatment. 5. Cholelithiasis. 6. Coronary artery disease.     CURRENT THERAPY: Enhertu  INTERVAL HISTORY:  April Rosales 82 y.o. female returns for f/u and evaluation   On Friday, October 30 she underwent CT of the abdomen with and without contrast to evaluate her liver metastasis.  This showed a targetoid hypoenhancing lesion of the left lobe of the liver significantly increased in size, an additional lesion of the central liver dome decreased in size, multiple additional subcentimeter lesions scattered throughout the liver not appreciably changed.  Findings were consistent with mixed response to treatment.  Incidentally they noted cholelithiasis and coronary artery disease along with aortic atherosclerosis.  She wanted to take a break from treatment. Last treatment with Enhertu on 10/30. She is here for follow up for MRI abdomen results.  She tells me that she has gotten weaker, has not been able to do much, her friend had to drive her today.  She is not eating much.  She does not really want to proceed with biopsy or further aggressive treatments.  She also reports some dizziness upon standing and urinary retention which is new.  She is hoping to hire some help at home and was wondering if we know of any resources.  Rest of the pertinent 10 point ROS reviewed and negative  Patient Active Problem List   Diagnosis Date Noted   Acute deep vein thrombosis (DVT) of right femoral vein (H[facility]) 09/18/2021   Thoracic compression fracture ([facility]) 05/15/2020   Closed compression fracture of body of L1 vertebra ([place]) 10/04/2019   Aortic atherosclerosis ([place]) 09/26/2019   Neuropathy 03/27/2016   Malignant neoplasm of upper-outer  quadrant of left breast in female, estrogen receptor positive ([place]) 09/11/2015   BMI 22.0-22.9, adult 03/14/2015   Metastasis to bone ([place]) 10/20/2014   Breast cancer metastasized to multiple sites ([place]) 08/28/2014   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  MEDICAL HISTORY: Past Medical History:  Diagnosis Date   Arthritis    Breast cancer [facility]) 1985/2016   takes [name] daily   Chronic back pain    stenosis/listhesis   Family history of ovarian cancer    Osteoporosis    takes Vit D   Radiation 11/23/14-12/11/14   30 Gy T1-T5     SURGICAL HISTORY: Past Surgical History:  Procedure Laterality Date   ABDOMINAL HYSTERECTOMY     1980  APPENDECTOMY  7106   APPLICATION OF INTRAOPERATIVE CT SCAN N/A 10/20/2014   Procedure: APPLICATION OF INTRAOPERATIVE CAT SCAN;  Surgeon: [name], MD;  Location: [place] [facility];  Service: Neurosurgery;  Laterality: N/A;   BREAST LUMPECTOMY WITH RADIOACTIVE SEED LOCALIZATION Left 05/03/2018   Procedure: LEFT BREAST LUMPECTOMY WITH BRACKETED RADIOACTIVE SEED LOCALIZATION;  Surgeon: [name], MD;  Location: [place];  Service: General;  Laterality: Left;   BREAST SURGERY Right 1985   THYROID CYST EXCISION  1967    SOCIAL HISTORY: Social History   Socioeconomic History   Marital status: Divorced    Spouse name: Not on file   Number of children: 1   Years of education: Not on file   Highest education level: Not on file  Occupational History   Not on file  Tobacco Use   Smoking status: Former    Packs/day: 0.50    Years: 10.00    Total pack years: 5.00     Types: Cigarettes    Quit date: 05/03/1970    Years since quitting: 52.1   Smokeless tobacco: Former   Tobacco comments:    quit smoking 1970's  Substance and Sexual Activity   Alcohol use: Yes    Alcohol/week: 0.0 standard drinks of alcohol    Comment: Rare   Drug use: No   Sexual activity: Not on file  Other Topics Concern   Not on file  Social History Narrative   Not on file   Social Determinants of Health   Financial Resource Strain: Not on file  Food Insecurity: Not on file  Transportation Needs: Not on file  Physical Activity: Not on file  Stress: Not on file  Social Connections: Not on file  Intimate Partner Violence: Not on file    FAMILY HISTORY: Family History  Problem Relation Age of Onset   Heart disease Mother    Hypertension Mother    Heart disease Father    Diabetes Father    Breast cancer Paternal Aunt        4 paternal aunts with breast cancer over 66   Prostate cancer Paternal Uncle    Stroke Paternal Grandfather    Ovarian cancer Paternal Aunt    Huntington's disease Other        Nephew, inherited from his father    Review of Systems  Constitutional:  Positive for fatigue. Negative for appetite change, chills, fever and unexpected weight change.  HENT:   Negative for hearing loss, lump/mass and trouble swallowing.   Eyes:  Negative for eye problems and icterus.  Respiratory:  Negative for chest tightness, cough and shortness of breath.   Cardiovascular:  Negative for chest pain, leg swelling and palpitations.  Gastrointestinal:  Negative for abdominal distention, abdominal pain, constipation, diarrhea, nausea and vomiting.  Endocrine: Negative for hot flashes.  Genitourinary:  Negative for difficulty urinating.        Urinary retention  Musculoskeletal:  Negative for arthralgias.  Skin:  Negative for itching and rash.  Neurological:  Positive for dizziness. Negative for extremity weakness, headaches and numbness.  Hematological:  Negative for  adenopathy. Does not bruise/bleed easily.  Psychiatric/Behavioral:  Negative for depression. The patient is not nervous/anxious.       PHYSICAL EXAMINATION  ECOG PERFORMANCE STATUS: 1 - Symptomatic but completely ambulatory  Vitals:   05/28/22 1253  BP: (!) 146/73  Pulse: (!) 108  Resp: 16  Temp: 97.9 F (36.6 C)  SpO2: 95%   Physical exam deferred today  She appears very anxious and stressed  No swelling of lower extremities   LABORATORY DATA:  CBC    Component Value Date/Time   WBC 12.6 (H) 05/28/2022 1230   WBC 4.5 11/14/2021 1150   RBC 4.10 05/28/2022 1230   HGB 13.0 05/28/2022 1230   HGB 15.1 05/29/2017 1417   HCT 37.7 05/28/2022 1230   HCT 45.1 05/29/2017 1417   PLT 450 (H) 05/28/2022 1230   PLT 262 05/29/2017 1417   MCV 92.0 05/28/2022 1230   MCV 91.7 05/29/2017 1417   MCH 31.7 05/28/2022 1230   MCHC 34.5 05/28/2022 1230   RDW 13.5 05/28/2022 1230   RDW 14.1 05/29/2017 1417   LYMPHSABS 0.9 05/28/2022 1230   LYMPHSABS 1.1 05/29/2017 1417   MONOABS 1.1 (H) 05/28/2022 1230   MONOABS 0.4 05/29/2017 1417   EOSABS 0.1 05/28/2022 1230   EOSABS 0.1 05/29/2017 1417   BASOSABS 0.1 05/28/2022 1230   BASOSABS 0.1 05/29/2017 1417    CMP     Component Value Date/Time   NA 135 05/28/2022 1230   NA 141 05/29/2017 1417   K 3.1 (L) 05/28/2022 1230   K 3.6 05/29/2017 1417   CL 99 05/28/2022 1230   CO2 30 05/28/2022 1230   CO2 27 05/29/2017 1417   GLUCOSE 126 (H) 05/28/2022 1230   GLUCOSE 111 05/29/2017 1417   BUN 8 05/28/2022 1230   BUN 17.0 05/29/2017 1417   CREATININE 0.45 05/28/2022 1230   CREATININE 0.66 03/05/2020 1218   CREATININE 0.8 05/29/2017 1417   CALCIUM 9.8 05/28/2022 1230   CALCIUM 10.7 (H) 06/26/2017 1216   CALCIUM 10.4 05/29/2017 1417   PROT 6.4 (L) 05/28/2022 1230   PROT 7.3 05/29/2017 1417   ALBUMIN 3.2 (L) 05/28/2022 1230   ALBUMIN 3.9 05/29/2017 1417   AST 24 05/28/2022 1230   AST 17 05/29/2017 1417   ALT 17 05/28/2022 1230   ALT  15 05/29/2017 1417   ALKPHOS 62 05/28/2022 1230   ALKPHOS 42 05/29/2017 1417   BILITOT 0.5 05/28/2022 1230   BILITOT 0.30 05/29/2017 1417   GFRNONAA >60 05/28/2022 1230   GFRNONAA 83 03/05/2020 1218   GFRAA 97 03/05/2020 1218     ASSESSMENT and THERAPY PLAN:   No problem-specific Assessment & Plan notes found for this encounter.  Malignant neoplasm of upper-outer quadrant of left breast in female, estrogen receptor positive ([place])  82 y.o. [place] woman with stage IV left breast cancer involving liver, bone currently on Enhertu who is here for follow up. Please review history above. After his last infusion, she had terrible fatigue, dizziness and felt that she is not really ready for more infusions yet.  Her last imaging was concerning for progression in 1 area of the liver, rest of the disease appears stable.  We thought it could be because of the lapse in treatment.  Since this was also considered as a solitary area of progression I referred her to radiology to see if there are any local modalities of treatment.   She however did not answer the phone call, she tells me that she does not answer phone calls from unfamiliar people.   She finally saw Dr. Laurence Ferrari in Interventional radiology has MRI and ultrasound-guided biopsy scheduled  She is here to review MRI abdomen results which shows marked progression in the hepatic tumor burden.  She tells me that she has gotten weaker, has been not been able to do much, eating less and had to ask her friend to drive  her today.  We have discussed that given her deteriorating performance status, it may be best to proceed with palliative care, hospice.  We have discussed about role of hospice in detail.  She will multiple times mentioned to me that she does not want to proceed with biopsy or any aggressive treatment.  She tells me that she did not want this to end this way however she is at peace with it. Will send a referral to hospice.  She also  requested handicap placard paperwork. In case she changes her mind, we have discussed that the biopsy, becomes important to test prognostics and likely foundation 1 testing.  We have also discussed about possible brain mets and leptomeningeal mets given the worsening dizziness and urinary retention.  She however is not interested in further imaging at this time. Referral placed to hospice.  Total time spent: 30 minutes *Total Encounter Time as defined by the Centers for Medicare and Medicaid Services includes, in addition to the face-to-face time of a patient visit (documented in the note above) non-face-to-face time: obtaining and reviewing outside history, ordering and reviewing medications, tests or procedures, care coordination (communications with other health care professionals or caregivers) and documentation in the medical record.

## 2022-05-29 DIAGNOSIS — Z170 Estrogen receptor positive status [ER+]: Secondary | ICD-10-CM | POA: Diagnosis not present

## 2022-05-29 DIAGNOSIS — C50412 Malignant neoplasm of upper-outer quadrant of left female breast: Secondary | ICD-10-CM | POA: Diagnosis not present

## 2022-05-29 DIAGNOSIS — R630 Anorexia: Secondary | ICD-10-CM | POA: Diagnosis not present

## 2022-05-29 DIAGNOSIS — Z87891 Personal history of nicotine dependence: Secondary | ICD-10-CM | POA: Diagnosis not present

## 2022-05-29 DIAGNOSIS — E785 Hyperlipidemia, unspecified: Secondary | ICD-10-CM | POA: Diagnosis not present

## 2022-05-29 DIAGNOSIS — C7951 Secondary malignant neoplasm of bone: Secondary | ICD-10-CM | POA: Diagnosis not present

## 2022-05-29 DIAGNOSIS — M1990 Unspecified osteoarthritis, unspecified site: Secondary | ICD-10-CM | POA: Diagnosis not present

## 2022-05-29 DIAGNOSIS — M545 Low back pain, unspecified: Secondary | ICD-10-CM | POA: Diagnosis not present

## 2022-05-29 DIAGNOSIS — C787 Secondary malignant neoplasm of liver and intrahepatic bile duct: Secondary | ICD-10-CM | POA: Diagnosis not present

## 2022-05-29 DIAGNOSIS — M810 Age-related osteoporosis without current pathological fracture: Secondary | ICD-10-CM | POA: Diagnosis not present

## 2022-05-29 DIAGNOSIS — E43 Unspecified severe protein-calorie malnutrition: Secondary | ICD-10-CM | POA: Diagnosis not present

## 2022-05-30 DIAGNOSIS — C50412 Malignant neoplasm of upper-outer quadrant of left female breast: Secondary | ICD-10-CM | POA: Diagnosis not present

## 2022-05-30 DIAGNOSIS — E43 Unspecified severe protein-calorie malnutrition: Secondary | ICD-10-CM | POA: Diagnosis not present

## 2022-05-30 DIAGNOSIS — Z170 Estrogen receptor positive status [ER+]: Secondary | ICD-10-CM | POA: Diagnosis not present

## 2022-05-30 DIAGNOSIS — C7951 Secondary malignant neoplasm of bone: Secondary | ICD-10-CM | POA: Diagnosis not present

## 2022-05-30 DIAGNOSIS — R630 Anorexia: Secondary | ICD-10-CM | POA: Diagnosis not present

## 2022-05-30 DIAGNOSIS — C787 Secondary malignant neoplasm of liver and intrahepatic bile duct: Secondary | ICD-10-CM | POA: Diagnosis not present

## 2022-06-01 DIAGNOSIS — R630 Anorexia: Secondary | ICD-10-CM | POA: Diagnosis not present

## 2022-06-01 DIAGNOSIS — C50412 Malignant neoplasm of upper-outer quadrant of left female breast: Secondary | ICD-10-CM | POA: Diagnosis not present

## 2022-06-01 DIAGNOSIS — C787 Secondary malignant neoplasm of liver and intrahepatic bile duct: Secondary | ICD-10-CM | POA: Diagnosis not present

## 2022-06-01 DIAGNOSIS — Z170 Estrogen receptor positive status [ER+]: Secondary | ICD-10-CM | POA: Diagnosis not present

## 2022-06-01 DIAGNOSIS — C7951 Secondary malignant neoplasm of bone: Secondary | ICD-10-CM | POA: Diagnosis not present

## 2022-06-01 DIAGNOSIS — E43 Unspecified severe protein-calorie malnutrition: Secondary | ICD-10-CM | POA: Diagnosis not present

## 2022-06-02 DIAGNOSIS — C7951 Secondary malignant neoplasm of bone: Secondary | ICD-10-CM | POA: Diagnosis not present

## 2022-06-02 DIAGNOSIS — C787 Secondary malignant neoplasm of liver and intrahepatic bile duct: Secondary | ICD-10-CM | POA: Diagnosis not present

## 2022-06-02 DIAGNOSIS — Z170 Estrogen receptor positive status [ER+]: Secondary | ICD-10-CM | POA: Diagnosis not present

## 2022-06-02 DIAGNOSIS — C50412 Malignant neoplasm of upper-outer quadrant of left female breast: Secondary | ICD-10-CM | POA: Diagnosis not present

## 2022-06-02 DIAGNOSIS — E43 Unspecified severe protein-calorie malnutrition: Secondary | ICD-10-CM | POA: Diagnosis not present

## 2022-06-02 DIAGNOSIS — R630 Anorexia: Secondary | ICD-10-CM | POA: Diagnosis not present

## 2022-06-05 ENCOUNTER — Other Ambulatory Visit: Payer: Self-pay | Admitting: Student

## 2022-06-05 DIAGNOSIS — C50919 Malignant neoplasm of unspecified site of unspecified female breast: Secondary | ICD-10-CM

## 2022-06-05 DIAGNOSIS — R160 Hepatomegaly, not elsewhere classified: Secondary | ICD-10-CM

## 2022-06-06 ENCOUNTER — Ambulatory Visit (HOSPITAL_COMMUNITY): Payer: Medicare Other

## 2022-06-10 DIAGNOSIS — E43 Unspecified severe protein-calorie malnutrition: Secondary | ICD-10-CM | POA: Diagnosis not present

## 2022-06-10 DIAGNOSIS — Z170 Estrogen receptor positive status [ER+]: Secondary | ICD-10-CM | POA: Diagnosis not present

## 2022-06-10 DIAGNOSIS — R630 Anorexia: Secondary | ICD-10-CM | POA: Diagnosis not present

## 2022-06-10 DIAGNOSIS — C7951 Secondary malignant neoplasm of bone: Secondary | ICD-10-CM | POA: Diagnosis not present

## 2022-06-10 DIAGNOSIS — C50412 Malignant neoplasm of upper-outer quadrant of left female breast: Secondary | ICD-10-CM | POA: Diagnosis not present

## 2022-06-10 DIAGNOSIS — C787 Secondary malignant neoplasm of liver and intrahepatic bile duct: Secondary | ICD-10-CM | POA: Diagnosis not present

## 2022-06-16 DIAGNOSIS — C50412 Malignant neoplasm of upper-outer quadrant of left female breast: Secondary | ICD-10-CM | POA: Diagnosis not present

## 2022-06-16 DIAGNOSIS — E43 Unspecified severe protein-calorie malnutrition: Secondary | ICD-10-CM | POA: Diagnosis not present

## 2022-06-16 DIAGNOSIS — C787 Secondary malignant neoplasm of liver and intrahepatic bile duct: Secondary | ICD-10-CM | POA: Diagnosis not present

## 2022-06-16 DIAGNOSIS — R630 Anorexia: Secondary | ICD-10-CM | POA: Diagnosis not present

## 2022-06-16 DIAGNOSIS — C7951 Secondary malignant neoplasm of bone: Secondary | ICD-10-CM | POA: Diagnosis not present

## 2022-06-16 DIAGNOSIS — Z170 Estrogen receptor positive status [ER+]: Secondary | ICD-10-CM | POA: Diagnosis not present

## 2022-06-17 DIAGNOSIS — C7951 Secondary malignant neoplasm of bone: Secondary | ICD-10-CM | POA: Diagnosis not present

## 2022-06-17 DIAGNOSIS — C787 Secondary malignant neoplasm of liver and intrahepatic bile duct: Secondary | ICD-10-CM | POA: Diagnosis not present

## 2022-06-17 DIAGNOSIS — C50412 Malignant neoplasm of upper-outer quadrant of left female breast: Secondary | ICD-10-CM | POA: Diagnosis not present

## 2022-06-17 DIAGNOSIS — Z170 Estrogen receptor positive status [ER+]: Secondary | ICD-10-CM | POA: Diagnosis not present

## 2022-06-17 DIAGNOSIS — R630 Anorexia: Secondary | ICD-10-CM | POA: Diagnosis not present

## 2022-06-17 DIAGNOSIS — E43 Unspecified severe protein-calorie malnutrition: Secondary | ICD-10-CM | POA: Diagnosis not present

## 2022-06-17 MED FILL — Dexamethasone Sodium Phosphate Inj 100 MG/10ML: INTRAMUSCULAR | Qty: 1 | Status: AC

## 2022-06-17 MED FILL — Fosaprepitant Dimeglumine For IV Infusion 150 MG (Base Eq): INTRAVENOUS | Qty: 5 | Status: AC

## 2022-06-18 ENCOUNTER — Inpatient Hospital Stay: Payer: Medicare Other | Admitting: Hematology and Oncology

## 2022-06-18 ENCOUNTER — Ambulatory Visit: Payer: Medicare Other

## 2022-06-18 ENCOUNTER — Inpatient Hospital Stay: Payer: Medicare Other

## 2022-06-18 NOTE — Progress Notes (Deleted)
[facility] up:    April Rosales, [name] [id number]   DIAGNOSIS:  Cancer Staging  Breast cancer metastasized to multiple sites [name]) Staging form: Breast, AJCC 7th Edition - Clinical: Stage IV ([name], [name], M1) - Signed by Chauncey Cruel, MD on 08/31/2014  Malignant neoplasm of upper-outer quadrant of left breast in female, estrogen receptor positive ([name]) Staging form: Breast, AJCC 7th Edition - Clinical: Stage IV ([name], [place], M1) - Signed by Chauncey Cruel, MD on 09/11/2015   SUMMARY OF ONCOLOGIC HISTORY: Oncology History  Breast cancer metastasized to multiple sites [name])  08/28/2014 Initial Diagnosis   Breast cancer metastasized to multiple sites ([name])   01/28/2021 -  Chemotherapy   Patient is on Treatment Plan : BREAST METASTATIC Fam-Trastuzumab Deruxtecan-nxki (Enhertu) (5.4) q21d     Metastasis to bone ([name])  10/20/2014 Initial Diagnosis   Metastasis to bone ([name])   01/28/2021 -  Chemotherapy   Patient is on Treatment Plan : BREAST METASTATIC Fam-Trastuzumab Deruxtecan-nxki (Enhertu) (5.4) q21d     10/18/2021 Imaging   IMPRESSION: 1. Widespread metastatic disease to the liver appears generally stable compared to the prior study, with exception of a new lesion in segment 3 of the liver which measures 1.4 x 1.0 cm, as detailed above. 2. Cholelithiasis without evidence of acute cholecystitis at this time.     Malignant neoplasm of upper-outer quadrant of left breast in female, estrogen receptor positive ([name])  09/02/1983 Surgery   status post right lumpectomy and axillary node dissection in 1985 followed by radiation at  [facility], exact date not clear.   08/16/2014 Imaging   METASTATIC DISEASE 08/16/2014: measurable disease in spine, lung and left breast   (2) evaluation for left shoulder pain led to thoracic spine MRI 08/16/2014 showing a pathologic fracture at T3 with epidural tumor displacing the cord to the  right, but no cord compression. CT scans of the chest, abdomen and pelvis 08/24/2014 showed in addition a mass in the upper outer quadrant left breast measuring 1.6 cm and a nodule in the minor fissure of the right lung measuring 1.2 cm, but no parenchymal lung or liver lesions.   (a) CA 27-29 was noninformative at 38 (09/20/2014)   08/29/2014 Mammogram   mammography and ultrasonography 08/29/2014 show a mass in the upper inner left breast which was palpable,  measuring 2.0 cm by ultrasound. Biopsy of this mass 08/29/2014 showed an invasive breast cancer with both lobular and ductal features, estrogen receptor positive, progesterone receptor weakly positive, with an MIB-1 in the 40% range, HER-2 equivocal (6 else ratio 1.5, but average number her nucleus 5.8    08/31/2014 - 05/29/2017 Anti-estrogen oral therapy   letrozole started 08/31/2014;   (a) palbociclib added Sept 2016 at 75 mg 21/7, with significant neutropenia; not repeated after first cycle  (b) letrozole discontinued 05/29/2017 with evidence of disease progression in the breast   10/20/2014 Surgery   10/20/2014 the patient underwent T2-T3 and T4 decompressive laminectomy with removal of epidural tumor, C7-T4 segmental pedicle screw instrumentation with virage screw system with arrow guidance protocol and C7-T4 posterolateral fusion. The cells were positive for the estrogen receptor. HER-2/neu testing by [name] showed again equivocal results,   11/23/2014 - 12/11/2014 Radiation Therapy   11/23/2014-12/11/2014.  (a) T1-T5 was treated to 30 Gy in 12 fractions at 2.5 Gy per fraction    09/11/2015 Initial Diagnosis   Malignant neoplasm of upper-outer quadrant of left breast in female, estrogen  receptor positive ([name])   05/29/2017 - 06/21/2018 Anti-estrogen oral therapy   fulvestrant started 05/29/2017, last dose 07/22/2018 (discontinued due to pandemic).  (a) started palbociclib 06/29/2017 at 75 mg every other day for 21 days on, 7 days off  (b)  palbociclib discontinued on 3/29 due to progressive fatigue and patient preference   05/04/2018 Surgery   status post left lumpectomy 05/03/2018 showing a pT1b pNX invasive ductal carcinoma, grade 2, with equivocal HER-2 results  (a) opted against adjuvant radiation given presence of stage IV disease   11/02/2018 PET scan   PET scan 11/02/2018 showed no active disease  (a) cerianna scan on 10/31/2019 shows activity in 2 liver lesions, no active bone or lung lesions  (b) abdominal ultrasound 11/08/2019 confirms 2 liver lesions measuring 4.4 and 2.1 cm.  (c) biopsy of 1 of the liver lesions 12/12/2019 confirmed metastatic breast cancer, estrogen and progesterone receptor positive, with an MIB-1 of 10%, and HER-2 positive, with a copy number of 2.12 and the number per cell 6.25  (d) abdominal ultrasound 03/12/2020 shows one of the liver lesions to have increased, while the second lesion has decreased   01/25/2019 - 12/02/2019 Anti-estrogen oral therapy    letrozole started 02/15/2019, discontinued 12/02/2019 with evidence of progression  (a) bone density 09/12/2016 showed a T score of -4.8.    12/08/2019 - 01/24/2021 Anti-estrogen oral therapy   fulvestrant resumed 12/08/2019  (a) palbociclib added at 75 mg daily 21/7 starting 12/20/2019   (b) fulvestrant and palbociclib discontinued September 2022 with evidence of progression.   04/25/2020 -  Antibody Plan   trastuzumab started 04/25/2020, repeat every 28 days  (a) echo 03/12/2020 shows an ejection fraction in the 60-65% range  (b) changed to subQ formulation of trastuzumab 06/20/2020  (c) trastuzumab discontinued September 2022 with MRI of the abdomen showing evidence of progression    Genetic Testing   genetics testing using the Breast/Ovarian Cancer Panel through [facility] [name], MD) found no deleterious mutations in ATM, BARD1, BRCA1, BRCA2, BRIP1, CDH1, CHEK2, EPCAM, FANCC, MLH1, MSH2, MSH6, NBN, PALB2, PMS2, PTEN, RAD51C, RAD51D, STK11,  TP53, or XRCC2      01/28/2021 -  Chemotherapy   Patient is on Treatment Plan : BREAST METASTATIC Fam-Trastuzumab Deruxtecan-nxki (Enhertu) (5.4) q21d     02/07/2021 -  Chemotherapy   Patient is on Treatment Plan :  BREAST METASTATIC fam-trastuzumab deruxtecan-nxki (Enhertu) q21d    starting Enhertu on 02/07/2021  (A) echo 11/21/2020 showed an ejection fraction in the 60-65% range  (B) Enhertu changed to every 4 weeks after the second dose to allow for additional recovery time  (C) treatment held after the third dose with significant drop in the patient's functional status  (D) restaging abdominal MRI 05/14/2021    04/25/2021 Imaging    acute DVT involving the right femoral vein and other proximal veins.  The left side was benign  (A) rivaroxaban started 04/25/2021, she stopped it 07/03/2021   07/17/2021 Imaging   MRI abdomen done 07/17/2021 showed multifocal hepatic metastatic disease with similar distribution when compared to prior study using diffusion-weighted images lesions are quite similar but with perhaps slight interval decrease size and some of the smaller lesions, no gross areas of new disease in the liver   10/18/2021 Imaging   IMPRESSION: 1. Widespread metastatic disease to the liver appears generally stable compared to the prior study, with exception of a new lesion in segment 3 of the liver which measures 1.4 x 1.0 cm, as detailed above. 2. Cholelithiasis  without evidence of acute cholecystitis at this time.     11/14/2021 Imaging   We tried to biopsy the lesion that appears worse on MRI, [place] however didn't show any metastatic lesion in the left lobe of the liver. Probable improvement in metastatic disease within the right lobe of the liver based on ultrasound measurements compared to the prior MRI last month. The largest central lesion shows clear decrease in size.   03/21/2022 Imaging   IMPRESSION: 1. Targetoid hypoenhancing lesion of the left lobe of the  liver significantly increased in size. 2. An additional lesion of the central liver dome is decreased in size. 3. Multiple additional subcentimeter lesions scattered throughout the liver are not appreciably changed. 4. Findings are consistent with mixed response to treatment. 5. Cholelithiasis. 6. Coronary artery disease.     CURRENT THERAPY: None  INTERVAL HISTORY:  April Rosales 82 y.o. female returns for f/u Rest of the pertinent 10 point ROS reviewed and negative  Patient Active Problem List   Diagnosis Date Noted   Acute deep vein thrombosis (DVT) of right femoral vein (HCC) 09/18/2021   Thoracic compression fracture (HCC) 05/15/2020   Closed compression fracture of body of L1 vertebra (HCC) 10/04/2019   Aortic atherosclerosis ([place]) 09/26/2019   Neuropathy 03/27/2016   Malignant neoplasm of upper-outer quadrant of left breast in female, estrogen receptor positive ([place]) 09/11/2015   BMI 22.0-22.9, adult 03/14/2015   Metastasis to bone ([place]) 10/20/2014   Breast cancer metastasized to multiple sites ([place]) 08/28/2014   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  MEDICAL HISTORY: Past Medical History:  Diagnosis Date   Arthritis    Breast cancer ([place]) 1985/2016   takes [name] daily   Chronic back pain    stenosis/listhesis   Family history of ovarian cancer    Osteoporosis    takes Vit D   Radiation 11/23/14-12/11/14   30 Gy T1-T5     SURGICAL HISTORY: Past Surgical History:  Procedure Laterality Date   ABDOMINAL HYSTERECTOMY     1980   APPENDECTOMY  1497   APPLICATION OF INTRAOPERATIVE CT SCAN N/A  10/20/2014   Procedure: APPLICATION OF INTRAOPERATIVE CAT SCAN;  Surgeon: [name], MD;  Location: [place] [facility];  Service: Neurosurgery;  Laterality: N/A;   BREAST LUMPECTOMY WITH RADIOACTIVE SEED LOCALIZATION Left 05/03/2018   Procedure: LEFT BREAST LUMPECTOMY WITH BRACKETED RADIOACTIVE SEED LOCALIZATION;  Surgeon: [name], MD;  Location: [place];  Service: General;  Laterality: Left;   BREAST SURGERY Right 1985   THYROID CYST EXCISION  1967    SOCIAL HISTORY: Social History   Socioeconomic History   Marital status: Divorced    Spouse name: Not on file   Number of children: 1   Years of education: Not on file   Highest education level: Not on file  Occupational History   Not on file  Tobacco Use   Smoking status: Former    Packs/day: 0.50    Years: 10.00    Total pack years: 5.00    Types: Cigarettes    Quit date: 05/03/1970    Years since quitting: 52.1   Smokeless  tobacco: Former   Tobacco comments:    quit smoking 1970's  Substance and Sexual Activity   Alcohol use: Yes    Alcohol/week: 0.0 standard drinks of alcohol    Comment: Rare   Drug use: No   Sexual activity: Not on file  Other Topics Concern   Not on file  Social History Narrative   Not on file   Social Determinants of Health   Financial Resource Strain: Not on file  Food Insecurity: Not on file  Transportation Needs: Not on file  Physical Activity: Not on file  Stress: Not on file  Social Connections: Not on file  Intimate Partner Violence: Not on file    FAMILY HISTORY: Family History  Problem Relation Age of Onset   Heart disease Mother    Hypertension Mother    Heart disease Father    Diabetes Father    Breast cancer Paternal Aunt        4 paternal aunts with breast cancer over 80   Prostate cancer Paternal Uncle    Stroke Paternal Grandfather    Ovarian cancer Paternal Aunt    Huntington's disease Other        Nephew, inherited from his father    Review  of Systems  Constitutional:  Positive for fatigue. Negative for appetite change, chills, fever and unexpected weight change.  HENT:   Negative for hearing loss, lump/mass and trouble swallowing.   Eyes:  Negative for eye problems and icterus.  Respiratory:  Negative for chest tightness, cough and shortness of breath.   Cardiovascular:  Negative for chest pain, leg swelling and palpitations.  Gastrointestinal:  Negative for abdominal distention, abdominal pain, constipation, diarrhea, nausea and vomiting.  Endocrine: Negative for hot flashes.  Genitourinary:  Negative for difficulty urinating.        Urinary retention  Musculoskeletal:  Negative for arthralgias.  Skin:  Negative for itching and rash.  Neurological:  Positive for dizziness. Negative for extremity weakness, headaches and numbness.  Hematological:  Negative for adenopathy. Does not bruise/bleed easily.  Psychiatric/Behavioral:  Negative for depression. The patient is not nervous/anxious.       PHYSICAL EXAMINATION  ECOG PERFORMANCE STATUS: 1 - Symptomatic but completely ambulatory  There were no vitals filed for this visit.  Physical exam deferred today She appears very anxious and stressed  No swelling of lower extremities   LABORATORY DATA:  CBC    Component Value Date/Time   WBC 12.6 (H) 05/28/2022 1230   WBC 4.5 11/14/2021 1150   RBC 4.10 05/28/2022 1230   HGB 13.0 05/28/2022 1230   HGB 15.1 05/29/2017 1417   HCT 37.7 05/28/2022 1230   HCT 45.1 05/29/2017 1417   PLT 450 (H) 05/28/2022 1230   PLT 262 05/29/2017 1417   MCV 92.0 05/28/2022 1230   MCV 91.7 05/29/2017 1417   MCH 31.7 05/28/2022 1230   MCHC 34.5 05/28/2022 1230   RDW 13.5 05/28/2022 1230   RDW 14.1 05/29/2017 1417   LYMPHSABS 0.9 05/28/2022 1230   LYMPHSABS 1.1 05/29/2017 1417   MONOABS 1.1 (H) 05/28/2022 1230   MONOABS 0.4 05/29/2017 1417   EOSABS 0.1 05/28/2022 1230   EOSABS 0.1 05/29/2017 1417   BASOSABS 0.1 05/28/2022 1230    BASOSABS 0.1 05/29/2017 1417    CMP     Component Value Date/Time   NA 135 05/28/2022 1230   NA 141 05/29/2017 1417   K 3.1 (L) 05/28/2022 1230   K 3.6 05/29/2017 1417   CL 99  05/28/2022 1230   CO2 30 05/28/2022 1230   CO2 27 05/29/2017 1417   GLUCOSE 126 (H) 05/28/2022 1230   GLUCOSE 111 05/29/2017 1417   BUN 8 05/28/2022 1230   BUN 17.0 05/29/2017 1417   CREATININE 0.45 05/28/2022 1230   CREATININE 0.66 03/05/2020 1218   CREATININE 0.8 05/29/2017 1417   CALCIUM 9.8 05/28/2022 1230   CALCIUM 10.7 (H) 06/26/2017 1216   CALCIUM 10.4 05/29/2017 1417   PROT 6.4 (L) 05/28/2022 1230   PROT 7.3 05/29/2017 1417   ALBUMIN 3.2 (L) 05/28/2022 1230   ALBUMIN 3.9 05/29/2017 1417   AST 24 05/28/2022 1230   AST 17 05/29/2017 1417   ALT 17 05/28/2022 1230   ALT 15 05/29/2017 1417   ALKPHOS 62 05/28/2022 1230   ALKPHOS 42 05/29/2017 1417   BILITOT 0.5 05/28/2022 1230   BILITOT 0.30 05/29/2017 1417   GFRNONAA >60 05/28/2022 1230   GFRNONAA 83 03/05/2020 1218   GFRAA 97 03/05/2020 1218     ASSESSMENT and THERAPY PLAN:   No problem-specific Assessment & Plan notes found for this encounter.  Malignant neoplasm of upper-outer quadrant of left breast in female, estrogen receptor positive ([place])  82 y.o. [place] woman with stage IV left breast cancer involving liver, bone currently on Enhertu who is here for follow up. Please review history above. After his last infusion, she had terrible fatigue, dizziness and felt that she is not really ready for more infusions yet.  Her last imaging was concerning for progression in 1 area of the liver, rest of the disease appears stable.  We thought it could be because of the lapse in treatment.  Since this was also considered as a solitary area of progression I referred her to radiology to see if there are any local modalities of treatment.   She however did not answer the phone call, she tells me that she does not answer phone calls from unfamiliar  people.   She finally saw Dr. Laurence Ferrari in Interventional radiology has MRI and ultrasound-guided biopsy scheduled  She is here to review MRI abdomen results which shows marked progression in the hepatic tumor burden.  She tells me that she has gotten weaker, has been not been able to do much, eating less and had to ask her friend to drive her today.  We have discussed that given her deteriorating performance status, it may be best to proceed with palliative care, hospice.  We have discussed about role of hospice in detail.  She will multiple times mentioned to me that she does not want to proceed with biopsy or any aggressive treatment.  She tells me that she did not want this to end this way however she is at peace with it. Will send a referral to hospice.  She also requested handicap placard paperwork. In case she changes her mind, we have discussed that the biopsy, becomes important to test prognostics and likely foundation 1 testing.  We have also discussed about possible brain mets and leptomeningeal mets given the worsening dizziness and urinary retention.  She however is not interested in further imaging at this time. Referral placed to hospice.  Total time spent: 30 minutes *Total Encounter Time as defined by the Centers for Medicare and Medicaid Services includes, in addition to the face-to-face time of a patient visit (documented in the note above) non-face-to-face time: obtaining and reviewing outside history, ordering and reviewing medications, tests or procedures, care coordination (communications with other health care professionals or caregivers) and documentation  in the medical record.

## 2022-06-24 DIAGNOSIS — Z170 Estrogen receptor positive status [ER+]: Secondary | ICD-10-CM | POA: Diagnosis not present

## 2022-06-24 DIAGNOSIS — C50412 Malignant neoplasm of upper-outer quadrant of left female breast: Secondary | ICD-10-CM | POA: Diagnosis not present

## 2022-06-24 DIAGNOSIS — C787 Secondary malignant neoplasm of liver and intrahepatic bile duct: Secondary | ICD-10-CM | POA: Diagnosis not present

## 2022-06-24 DIAGNOSIS — E43 Unspecified severe protein-calorie malnutrition: Secondary | ICD-10-CM | POA: Diagnosis not present

## 2022-06-24 DIAGNOSIS — R630 Anorexia: Secondary | ICD-10-CM | POA: Diagnosis not present

## 2022-06-24 DIAGNOSIS — C7951 Secondary malignant neoplasm of bone: Secondary | ICD-10-CM | POA: Diagnosis not present

## 2022-06-26 DIAGNOSIS — E785 Hyperlipidemia, unspecified: Secondary | ICD-10-CM | POA: Diagnosis not present

## 2022-06-26 DIAGNOSIS — R630 Anorexia: Secondary | ICD-10-CM | POA: Diagnosis not present

## 2022-06-26 DIAGNOSIS — M1990 Unspecified osteoarthritis, unspecified site: Secondary | ICD-10-CM | POA: Diagnosis not present

## 2022-06-26 DIAGNOSIS — C50412 Malignant neoplasm of upper-outer quadrant of left female breast: Secondary | ICD-10-CM | POA: Diagnosis not present

## 2022-06-26 DIAGNOSIS — Z170 Estrogen receptor positive status [ER+]: Secondary | ICD-10-CM | POA: Diagnosis not present

## 2022-06-26 DIAGNOSIS — C787 Secondary malignant neoplasm of liver and intrahepatic bile duct: Secondary | ICD-10-CM | POA: Diagnosis not present

## 2022-06-26 DIAGNOSIS — C7951 Secondary malignant neoplasm of bone: Secondary | ICD-10-CM | POA: Diagnosis not present

## 2022-06-26 DIAGNOSIS — M545 Low back pain, unspecified: Secondary | ICD-10-CM | POA: Diagnosis not present

## 2022-06-26 DIAGNOSIS — Z87891 Personal history of nicotine dependence: Secondary | ICD-10-CM | POA: Diagnosis not present

## 2022-06-26 DIAGNOSIS — M810 Age-related osteoporosis without current pathological fracture: Secondary | ICD-10-CM | POA: Diagnosis not present

## 2022-06-26 DIAGNOSIS — E43 Unspecified severe protein-calorie malnutrition: Secondary | ICD-10-CM | POA: Diagnosis not present

## 2022-07-01 DIAGNOSIS — R630 Anorexia: Secondary | ICD-10-CM | POA: Diagnosis not present

## 2022-07-01 DIAGNOSIS — C50412 Malignant neoplasm of upper-outer quadrant of left female breast: Secondary | ICD-10-CM | POA: Diagnosis not present

## 2022-07-01 DIAGNOSIS — Z170 Estrogen receptor positive status [ER+]: Secondary | ICD-10-CM | POA: Diagnosis not present

## 2022-07-01 DIAGNOSIS — E43 Unspecified severe protein-calorie malnutrition: Secondary | ICD-10-CM | POA: Diagnosis not present

## 2022-07-01 DIAGNOSIS — C787 Secondary malignant neoplasm of liver and intrahepatic bile duct: Secondary | ICD-10-CM | POA: Diagnosis not present

## 2022-07-01 DIAGNOSIS — C7951 Secondary malignant neoplasm of bone: Secondary | ICD-10-CM | POA: Diagnosis not present

## 2022-07-03 DIAGNOSIS — E43 Unspecified severe protein-calorie malnutrition: Secondary | ICD-10-CM | POA: Diagnosis not present

## 2022-07-03 DIAGNOSIS — C50412 Malignant neoplasm of upper-outer quadrant of left female breast: Secondary | ICD-10-CM | POA: Diagnosis not present

## 2022-07-03 DIAGNOSIS — Z170 Estrogen receptor positive status [ER+]: Secondary | ICD-10-CM | POA: Diagnosis not present

## 2022-07-03 DIAGNOSIS — R630 Anorexia: Secondary | ICD-10-CM | POA: Diagnosis not present

## 2022-07-03 DIAGNOSIS — C787 Secondary malignant neoplasm of liver and intrahepatic bile duct: Secondary | ICD-10-CM | POA: Diagnosis not present

## 2022-07-03 DIAGNOSIS — C7951 Secondary malignant neoplasm of bone: Secondary | ICD-10-CM | POA: Diagnosis not present

## 2022-07-06 ENCOUNTER — Other Ambulatory Visit: Payer: Self-pay

## 2022-07-07 ENCOUNTER — Encounter (HOSPITAL_COMMUNITY): Payer: Medicare Other | Admitting: Internal Medicine

## 2022-07-07 ENCOUNTER — Ambulatory Visit (HOSPITAL_COMMUNITY): Payer: Medicare Other

## 2022-07-08 DIAGNOSIS — R630 Anorexia: Secondary | ICD-10-CM | POA: Diagnosis not present

## 2022-07-08 DIAGNOSIS — C7951 Secondary malignant neoplasm of bone: Secondary | ICD-10-CM | POA: Diagnosis not present

## 2022-07-08 DIAGNOSIS — Z170 Estrogen receptor positive status [ER+]: Secondary | ICD-10-CM | POA: Diagnosis not present

## 2022-07-08 DIAGNOSIS — C787 Secondary malignant neoplasm of liver and intrahepatic bile duct: Secondary | ICD-10-CM | POA: Diagnosis not present

## 2022-07-08 DIAGNOSIS — E43 Unspecified severe protein-calorie malnutrition: Secondary | ICD-10-CM | POA: Diagnosis not present

## 2022-07-08 DIAGNOSIS — C50412 Malignant neoplasm of upper-outer quadrant of left female breast: Secondary | ICD-10-CM | POA: Diagnosis not present

## 2022-07-08 MED FILL — Fosaprepitant Dimeglumine For IV Infusion 150 MG (Base Eq): INTRAVENOUS | Qty: 5 | Status: AC

## 2022-07-08 MED FILL — Dexamethasone Sodium Phosphate Inj 100 MG/10ML: INTRAMUSCULAR | Qty: 1 | Status: AC

## 2022-07-09 ENCOUNTER — Inpatient Hospital Stay: Payer: Medicare Other | Admitting: Hematology and Oncology

## 2022-07-09 ENCOUNTER — Inpatient Hospital Stay: Payer: Medicare Other

## 2022-07-09 NOTE — Progress Notes (Deleted)
[facility]:    [name], [name] [id number]   DIAGNOSIS:  Cancer Staging  Breast cancer metastasized to multiple sites [name]) Staging form: Breast, AJCC 7th Edition - Clinical: Stage IV ([name], [name], M1) - Signed by Chauncey Cruel, MD on 08/31/2014  Malignant neoplasm of upper-outer quadrant of left breast in female, estrogen receptor positive ([name]) Staging form: Breast, AJCC 7th Edition - Clinical: Stage IV ([name], [place], M1) - Signed by Chauncey Cruel, MD on 09/11/2015   SUMMARY OF ONCOLOGIC HISTORY: Oncology History  Breast cancer metastasized to multiple sites [name])  08/28/2014 Initial Diagnosis   Breast cancer metastasized to multiple sites ([name])   01/28/2021 -  Chemotherapy   Patient is on Treatment Plan : BREAST METASTATIC Fam-Trastuzumab Deruxtecan-nxki (Enhertu) (5.4) q21d     Metastasis to bone ([name])  10/20/2014 Initial Diagnosis   Metastasis to bone ([name])   01/28/2021 -  Chemotherapy   Patient is on Treatment Plan : BREAST METASTATIC Fam-Trastuzumab Deruxtecan-nxki (Enhertu) (5.4) q21d     10/18/2021 Imaging   IMPRESSION: 1. Widespread metastatic disease to the liver appears generally stable compared to the prior study, with exception of a new lesion in segment 3 of the liver which measures 1.4 x 1.0 cm, as detailed above. 2. Cholelithiasis without evidence of acute cholecystitis at this time.     Malignant neoplasm of upper-outer quadrant of left breast in female, estrogen receptor positive ([name])  09/02/1983 Surgery   status post right lumpectomy and axillary node dissection in 1985 followed by radiation at [facility], exact date not clear.   08/16/2014 Imaging   METASTATIC DISEASE 08/16/2014: measurable disease in spine, lung and left breast   (2) evaluation for left shoulder pain led to thoracic spine MRI 08/16/2014 showing a pathologic fracture at T3 with epidural tumor displacing the cord to the  right, but no cord compression. CT scans of the chest, abdomen and pelvis 08/24/2014 showed in addition a mass in the upper outer quadrant left breast measuring 1.6 cm and a nodule in the minor fissure of the right lung measuring 1.2 cm, but no parenchymal lung or liver lesions.   (a) CA 27-29 was noninformative at 38 (09/20/2014)   08/29/2014 Mammogram   mammography and ultrasonography 08/29/2014 show a mass in the upper inner left breast which was palpable,  measuring 2.0 cm by ultrasound. Biopsy of this mass 08/29/2014 showed an invasive breast cancer with both lobular and ductal features, estrogen receptor positive, progesterone receptor weakly positive, with an MIB-1 in the 40% range, HER-2 equivocal (6 else ratio 1.5, but average number her nucleus 5.8    08/31/2014 - 05/29/2017 Anti-estrogen oral therapy   letrozole started 08/31/2014;   (a) palbociclib added Sept 2016 at 75 mg 21/7, with significant neutropenia; not repeated after first cycle  (b) letrozole discontinued 05/29/2017 with evidence of disease progression in the breast   10/20/2014 Surgery   10/20/2014 the patient underwent T2-T3 and T4 decompressive laminectomy with removal of epidural tumor, C7-T4 segmental pedicle screw instrumentation with virage screw system with arrow guidance protocol and C7-T4 posterolateral fusion. The cells were positive for the estrogen receptor. HER-2/neu testing by [name] showed again equivocal results,   11/23/2014 - 12/11/2014 Radiation Therapy   11/23/2014-12/11/2014.  (a) T1-T5 was treated to 30 Gy in 12 fractions at 2.5 Gy per fraction    09/11/2015 Initial Diagnosis   Malignant neoplasm of upper-outer quadrant of left breast in female, estrogen  receptor positive ([name])   05/29/2017 - 06/21/2018 Anti-estrogen oral therapy   fulvestrant started 05/29/2017, last dose 07/22/2018 (discontinued due to pandemic).  (a) started palbociclib 06/29/2017 at 75 mg every other day for 21 days on, 7 days off  (b)  palbociclib discontinued on 3/29 due to progressive fatigue and patient preference   05/04/2018 Surgery   status post left lumpectomy 05/03/2018 showing a pT1b pNX invasive ductal carcinoma, grade 2, with equivocal HER-2 results  (a) opted against adjuvant radiation given presence of stage IV disease   11/02/2018 PET scan   PET scan 11/02/2018 showed no active disease  (a) cerianna scan on 10/31/2019 shows activity in 2 liver lesions, no active bone or lung lesions  (b) abdominal ultrasound 11/08/2019 confirms 2 liver lesions measuring 4.4 and 2.1 cm.  (c) biopsy of 1 of the liver lesions 12/12/2019 confirmed metastatic breast cancer, estrogen and progesterone receptor positive, with an MIB-1 of 10%, and HER-2 positive, with a copy number of 2.12 and the number per cell 6.25  (d) abdominal ultrasound 03/12/2020 shows one of the liver lesions to have increased, while the second lesion has decreased   01/25/2019 - 12/02/2019 Anti-estrogen oral therapy    letrozole started 02/15/2019, discontinued 12/02/2019 with evidence of progression  (a) bone density 09/12/2016 showed a T score of -4.8.    12/08/2019 - 01/24/2021 Anti-estrogen oral therapy   fulvestrant resumed 12/08/2019  (a) palbociclib added at 75 mg daily 21/7 starting 12/20/2019   (b) fulvestrant and palbociclib discontinued September 2022 with evidence of progression.   04/25/2020 -  Antibody Plan   trastuzumab started 04/25/2020, repeat every 28 days  (a) echo 03/12/2020 shows an ejection fraction in the 60-65% range  (b) changed to subQ formulation of trastuzumab 06/20/2020  (c) trastuzumab discontinued September 2022 with MRI of the abdomen showing evidence of progression    Genetic Testing   genetics testing using the Breast/Ovarian Cancer Panel through [facility] [name], MD) found no deleterious mutations in ATM, BARD1, BRCA1, BRCA2, BRIP1, CDH1, CHEK2, EPCAM, FANCC, MLH1, MSH2, MSH6, NBN, PALB2, PMS2, PTEN, RAD51C, RAD51D, STK11,  TP53, or XRCC2      01/28/2021 -  Chemotherapy   Patient is on Treatment Plan : BREAST METASTATIC Fam-Trastuzumab Deruxtecan-nxki (Enhertu) (5.4) q21d     02/07/2021 -  Chemotherapy   Patient is on Treatment Plan :  BREAST METASTATIC fam-trastuzumab deruxtecan-nxki (Enhertu) q21d    starting Enhertu on 02/07/2021  (A) echo 11/21/2020 showed an ejection fraction in the 60-65% range  (B) Enhertu changed to every 4 weeks after the second dose to allow for additional recovery time  (C) treatment held after the third dose with significant drop in the patient's functional status  (D) restaging abdominal MRI 05/14/2021    04/25/2021 Imaging    acute DVT involving the right femoral vein and other proximal veins.  The left side was benign  (A) rivaroxaban started 04/25/2021, she stopped it 07/03/2021   07/17/2021 Imaging   MRI abdomen done 07/17/2021 showed multifocal hepatic metastatic disease with similar distribution when compared to prior study using diffusion-weighted images lesions are quite similar but with perhaps slight interval decrease size and some of the smaller lesions, no gross areas of new disease in the liver   10/18/2021 Imaging   IMPRESSION: 1. Widespread metastatic disease to the liver appears generally stable compared to the prior study, with exception of a new lesion in segment 3 of the liver which measures 1.4 x 1.0 cm, as detailed above. 2. Cholelithiasis  without evidence of acute cholecystitis at this time.     11/14/2021 Imaging   We tried to biopsy the lesion that appears worse on MRI, [place] however didn't show any metastatic lesion in the left lobe of the liver. Probable improvement in metastatic disease within the right lobe of the liver based on ultrasound measurements compared to the prior MRI last month. The largest central lesion shows clear decrease in size.   03/21/2022 Imaging   IMPRESSION: 1. Targetoid hypoenhancing lesion of the left lobe of the  liver significantly increased in size. 2. An additional lesion of the central liver dome is decreased in size. 3. Multiple additional subcentimeter lesions scattered throughout the liver are not appreciably changed. 4. Findings are consistent with mixed response to treatment. 5. Cholelithiasis. 6. Coronary artery disease.     CURRENT THERAPY: Enhertu  INTERVAL HISTORY:  April Rosales 82 y.o. female returns for f/u and evaluation   On Friday, October 30 she underwent CT of the abdomen with and without contrast to evaluate her liver metastasis.  This showed a targetoid hypoenhancing lesion of the left lobe of the liver significantly increased in size, an additional lesion of the central liver dome decreased in size, multiple additional subcentimeter lesions scattered throughout the liver not appreciably changed.  Findings were consistent with mixed response to treatment.  Incidentally they noted cholelithiasis and coronary artery disease along with aortic atherosclerosis.  She wanted to take a break from treatment. Last treatment with Enhertu on 10/30. She is here for follow up for MRI abdomen results.  She tells me that she has gotten weaker, has not been able to do much, her friend had to drive her today.  She is not eating much.  She does not really want to proceed with biopsy or further aggressive treatments.  She also reports some dizziness upon standing and urinary retention which is new.  She is hoping to hire some help at home and was wondering if we know of any resources.  Rest of the pertinent 10 point ROS reviewed and negative  Patient Active Problem List   Diagnosis Date Noted   Acute deep vein thrombosis (DVT) of right femoral vein (H[facility]) 09/18/2021   Thoracic compression fracture ([facility]) 05/15/2020   Closed compression fracture of body of L1 vertebra ([place]) 10/04/2019   Aortic atherosclerosis ([place]) 09/26/2019   Neuropathy 03/27/2016   Malignant neoplasm of upper-outer  quadrant of left breast in female, estrogen receptor positive ([place]) 09/11/2015   BMI 22.0-22.9, adult 03/14/2015   Metastasis to bone ([place]) 10/20/2014   Breast cancer metastasized to multiple sites ([place]) 08/28/2014   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  MEDICAL HISTORY: Past Medical History:  Diagnosis Date   Arthritis    Breast cancer [facility]) 1985/2016   takes [name] daily   Chronic back pain    stenosis/listhesis   Family history of ovarian cancer    Osteoporosis    takes Vit D   Radiation 11/23/14-12/11/14   30 Gy T1-T5     SURGICAL HISTORY: Past Surgical History:  Procedure Laterality Date   ABDOMINAL HYSTERECTOMY     1980  APPENDECTOMY  7341   APPLICATION OF INTRAOPERATIVE CT SCAN N/A 10/20/2014   Procedure: APPLICATION OF INTRAOPERATIVE CAT SCAN;  Surgeon: [name], MD;  Location: [place] NEURO ORS;  Service: Neurosurgery;  Laterality: N/A;   BREAST LUMPECTOMY WITH RADIOACTIVE SEED LOCALIZATION Left 05/03/2018   Procedure: LEFT BREAST LUMPECTOMY WITH BRACKETED RADIOACTIVE SEED LOCALIZATION;  Surgeon: [name], MD;  Location: [place];  Service: General;  Laterality: Left;   BREAST SURGERY Right 1985   THYROID CYST EXCISION  1967    SOCIAL HISTORY: Social History   Socioeconomic History   Marital status: Divorced    Spouse name: Not on file   Number of children: 1   Years of education: Not on file   Highest education level: Not on file  Occupational History   Not on file  Tobacco Use   Smoking status: Former    Packs/day: 0.50    Years: 10.00    Total pack years: 5.00     Types: Cigarettes    Quit date: 05/03/1970    Years since quitting: 52.2   Smokeless tobacco: Former   Tobacco comments:    quit smoking 1970's  Substance and Sexual Activity   Alcohol use: Yes    Alcohol/week: 0.0 standard drinks of alcohol    Comment: Rare   Drug use: No   Sexual activity: Not on file  Other Topics Concern   Not on file  Social History Narrative   Not on file   Social Determinants of Health   Financial Resource Strain: Not on file  Food Insecurity: Not on file  Transportation Needs: Not on file  Physical Activity: Not on file  Stress: Not on file  Social Connections: Not on file  Intimate Partner Violence: Not on file    FAMILY HISTORY: Family History  Problem Relation Age of Onset   Heart disease Mother    Hypertension Mother    Heart disease Father    Diabetes Father    Breast cancer Paternal Aunt        4 paternal aunts with breast cancer over 13   Prostate cancer Paternal Uncle    Stroke Paternal Grandfather    Ovarian cancer Paternal Aunt    Huntington's disease Other        Nephew, inherited from his father    Review of Systems  Constitutional:  Positive for fatigue. Negative for appetite change, chills, fever and unexpected weight change.  HENT:   Negative for hearing loss, lump/mass and trouble swallowing.   Eyes:  Negative for eye problems and icterus.  Respiratory:  Negative for chest tightness, cough and shortness of breath.   Cardiovascular:  Negative for chest pain, leg swelling and palpitations.  Gastrointestinal:  Negative for abdominal distention, abdominal pain, constipation, diarrhea, nausea and vomiting.  Endocrine: Negative for hot flashes.  Genitourinary:  Negative for difficulty urinating.        Urinary retention  Musculoskeletal:  Negative for arthralgias.  Skin:  Negative for itching and rash.  Neurological:  Positive for dizziness. Negative for extremity weakness, headaches and numbness.  Hematological:  Negative for  adenopathy. Does not bruise/bleed easily.  Psychiatric/Behavioral:  Negative for depression. The patient is not nervous/anxious.       PHYSICAL EXAMINATION  ECOG PERFORMANCE STATUS: 1 - Symptomatic but completely ambulatory  There were no vitals filed for this visit.  Physical exam deferred today She appears very anxious and stressed  No swelling of lower extremities   LABORATORY DATA:  CBC  Component Value Date/Time   WBC 12.6 (H) 05/28/2022 1230   WBC 4.5 11/14/2021 1150   RBC 4.10 05/28/2022 1230   HGB 13.0 05/28/2022 1230   HGB 15.1 05/29/2017 1417   HCT 37.7 05/28/2022 1230   HCT 45.1 05/29/2017 1417   PLT 450 (H) 05/28/2022 1230   PLT 262 05/29/2017 1417   MCV 92.0 05/28/2022 1230   MCV 91.7 05/29/2017 1417   MCH 31.7 05/28/2022 1230   MCHC 34.5 05/28/2022 1230   RDW 13.5 05/28/2022 1230   RDW 14.1 05/29/2017 1417   LYMPHSABS 0.9 05/28/2022 1230   LYMPHSABS 1.1 05/29/2017 1417   MONOABS 1.1 (H) 05/28/2022 1230   MONOABS 0.4 05/29/2017 1417   EOSABS 0.1 05/28/2022 1230   EOSABS 0.1 05/29/2017 1417   BASOSABS 0.1 05/28/2022 1230   BASOSABS 0.1 05/29/2017 1417    CMP     Component Value Date/Time   NA 135 05/28/2022 1230   NA 141 05/29/2017 1417   K 3.1 (L) 05/28/2022 1230   K 3.6 05/29/2017 1417   CL 99 05/28/2022 1230   CO2 30 05/28/2022 1230   CO2 27 05/29/2017 1417   GLUCOSE 126 (H) 05/28/2022 1230   GLUCOSE 111 05/29/2017 1417   BUN 8 05/28/2022 1230   BUN 17.0 05/29/2017 1417   CREATININE 0.45 05/28/2022 1230   CREATININE 0.66 03/05/2020 1218   CREATININE 0.8 05/29/2017 1417   CALCIUM 9.8 05/28/2022 1230   CALCIUM 10.7 (H) 06/26/2017 1216   CALCIUM 10.4 05/29/2017 1417   PROT 6.4 (L) 05/28/2022 1230   PROT 7.3 05/29/2017 1417   ALBUMIN 3.2 (L) 05/28/2022 1230   ALBUMIN 3.9 05/29/2017 1417   AST 24 05/28/2022 1230   AST 17 05/29/2017 1417   ALT 17 05/28/2022 1230   ALT 15 05/29/2017 1417   ALKPHOS 62 05/28/2022 1230   ALKPHOS 42  05/29/2017 1417   BILITOT 0.5 05/28/2022 1230   BILITOT 0.30 05/29/2017 1417   GFRNONAA >60 05/28/2022 1230   GFRNONAA 83 03/05/2020 1218   GFRAA 97 03/05/2020 1218     ASSESSMENT and THERAPY PLAN:   No problem-specific Assessment & Plan notes found for this encounter.  Malignant neoplasm of upper-outer quadrant of left breast in female, estrogen receptor positive ([place])  82 y.o. [place] woman with stage IV left breast cancer involving liver, bone currently on Enhertu who is here for follow up. Please review history above. After his last infusion, she had terrible fatigue, dizziness and felt that she is not really ready for more infusions yet.  Her last imaging was concerning for progression in 1 area of the liver, rest of the disease appears stable.  We thought it could be because of the lapse in treatment.  Since this was also considered as a solitary area of progression I referred her to radiology to see if there are any local modalities of treatment.   She however did not answer the phone call, she tells me that she does not answer phone calls from unfamiliar people.   She finally saw Dr. Laurence Ferrari in Interventional radiology has MRI and ultrasound-guided biopsy scheduled  She is here to review MRI abdomen results which shows marked progression in the hepatic tumor burden.  She tells me that she has gotten weaker, has been not been able to do much, eating less and had to ask her friend to drive her today.  We have discussed that given her deteriorating performance status, it may be best to proceed with palliative care,  hospice.  We have discussed about role of hospice in detail.  She will multiple times mentioned to me that she does not want to proceed with biopsy or any aggressive treatment.  She tells me that she did not want this to end this way however she is at peace with it. Will send a referral to hospice.  She also requested handicap placard paperwork. In case she changes her  mind, we have discussed that the biopsy, becomes important to test prognostics and likely foundation 1 testing.  We have also discussed about possible brain mets and leptomeningeal mets given the worsening dizziness and urinary retention.  She however is not interested in further imaging at this time. Referral placed to hospice.  Total time spent: 30 minutes *Total Encounter Time as defined by the Centers for Medicare and Medicaid Services includes, in addition to the face-to-face time of a patient visit (documented in the note above) non-face-to-face time: obtaining and reviewing outside history, ordering and reviewing medications, tests or procedures, care coordination (communications with other health care professionals or caregivers) and documentation in the medical record.

## 2022-07-15 DIAGNOSIS — C7951 Secondary malignant neoplasm of bone: Secondary | ICD-10-CM | POA: Diagnosis not present

## 2022-07-15 DIAGNOSIS — C787 Secondary malignant neoplasm of liver and intrahepatic bile duct: Secondary | ICD-10-CM | POA: Diagnosis not present

## 2022-07-15 DIAGNOSIS — E43 Unspecified severe protein-calorie malnutrition: Secondary | ICD-10-CM | POA: Diagnosis not present

## 2022-07-15 DIAGNOSIS — Z170 Estrogen receptor positive status [ER+]: Secondary | ICD-10-CM | POA: Diagnosis not present

## 2022-07-15 DIAGNOSIS — C50412 Malignant neoplasm of upper-outer quadrant of left female breast: Secondary | ICD-10-CM | POA: Diagnosis not present

## 2022-07-15 DIAGNOSIS — R630 Anorexia: Secondary | ICD-10-CM | POA: Diagnosis not present

## 2022-07-17 ENCOUNTER — Other Ambulatory Visit: Payer: Self-pay

## 2022-07-17 DIAGNOSIS — Z170 Estrogen receptor positive status [ER+]: Secondary | ICD-10-CM | POA: Diagnosis not present

## 2022-07-17 DIAGNOSIS — R630 Anorexia: Secondary | ICD-10-CM | POA: Diagnosis not present

## 2022-07-17 DIAGNOSIS — C7951 Secondary malignant neoplasm of bone: Secondary | ICD-10-CM | POA: Diagnosis not present

## 2022-07-17 DIAGNOSIS — C50412 Malignant neoplasm of upper-outer quadrant of left female breast: Secondary | ICD-10-CM | POA: Diagnosis not present

## 2022-07-17 DIAGNOSIS — E43 Unspecified severe protein-calorie malnutrition: Secondary | ICD-10-CM | POA: Diagnosis not present

## 2022-07-17 DIAGNOSIS — C787 Secondary malignant neoplasm of liver and intrahepatic bile duct: Secondary | ICD-10-CM | POA: Diagnosis not present

## 2022-07-22 DIAGNOSIS — E43 Unspecified severe protein-calorie malnutrition: Secondary | ICD-10-CM | POA: Diagnosis not present

## 2022-07-22 DIAGNOSIS — C7951 Secondary malignant neoplasm of bone: Secondary | ICD-10-CM | POA: Diagnosis not present

## 2022-07-22 DIAGNOSIS — C50412 Malignant neoplasm of upper-outer quadrant of left female breast: Secondary | ICD-10-CM | POA: Diagnosis not present

## 2022-07-22 DIAGNOSIS — Z170 Estrogen receptor positive status [ER+]: Secondary | ICD-10-CM | POA: Diagnosis not present

## 2022-07-22 DIAGNOSIS — C787 Secondary malignant neoplasm of liver and intrahepatic bile duct: Secondary | ICD-10-CM | POA: Diagnosis not present

## 2022-07-22 DIAGNOSIS — R630 Anorexia: Secondary | ICD-10-CM | POA: Diagnosis not present

## 2022-07-25 DIAGNOSIS — M545 Low back pain, unspecified: Secondary | ICD-10-CM | POA: Diagnosis not present

## 2022-07-25 DIAGNOSIS — E785 Hyperlipidemia, unspecified: Secondary | ICD-10-CM | POA: Diagnosis not present

## 2022-07-25 DIAGNOSIS — Z170 Estrogen receptor positive status [ER+]: Secondary | ICD-10-CM | POA: Diagnosis not present

## 2022-07-25 DIAGNOSIS — C7951 Secondary malignant neoplasm of bone: Secondary | ICD-10-CM | POA: Diagnosis not present

## 2022-07-25 DIAGNOSIS — C787 Secondary malignant neoplasm of liver and intrahepatic bile duct: Secondary | ICD-10-CM | POA: Diagnosis not present

## 2022-07-25 DIAGNOSIS — M1990 Unspecified osteoarthritis, unspecified site: Secondary | ICD-10-CM | POA: Diagnosis not present

## 2022-07-25 DIAGNOSIS — Z87891 Personal history of nicotine dependence: Secondary | ICD-10-CM | POA: Diagnosis not present

## 2022-07-25 DIAGNOSIS — C50412 Malignant neoplasm of upper-outer quadrant of left female breast: Secondary | ICD-10-CM | POA: Diagnosis not present

## 2022-07-25 DIAGNOSIS — E43 Unspecified severe protein-calorie malnutrition: Secondary | ICD-10-CM | POA: Diagnosis not present

## 2022-07-25 DIAGNOSIS — M810 Age-related osteoporosis without current pathological fracture: Secondary | ICD-10-CM | POA: Diagnosis not present

## 2022-07-25 DIAGNOSIS — R630 Anorexia: Secondary | ICD-10-CM | POA: Diagnosis not present

## 2022-07-28 DIAGNOSIS — C7951 Secondary malignant neoplasm of bone: Secondary | ICD-10-CM | POA: Diagnosis not present

## 2022-07-28 DIAGNOSIS — Z170 Estrogen receptor positive status [ER+]: Secondary | ICD-10-CM | POA: Diagnosis not present

## 2022-07-28 DIAGNOSIS — R630 Anorexia: Secondary | ICD-10-CM | POA: Diagnosis not present

## 2022-07-28 DIAGNOSIS — E43 Unspecified severe protein-calorie malnutrition: Secondary | ICD-10-CM | POA: Diagnosis not present

## 2022-07-28 DIAGNOSIS — C787 Secondary malignant neoplasm of liver and intrahepatic bile duct: Secondary | ICD-10-CM | POA: Diagnosis not present

## 2022-07-28 DIAGNOSIS — C50412 Malignant neoplasm of upper-outer quadrant of left female breast: Secondary | ICD-10-CM | POA: Diagnosis not present

## 2022-07-29 DIAGNOSIS — C50412 Malignant neoplasm of upper-outer quadrant of left female breast: Secondary | ICD-10-CM | POA: Diagnosis not present

## 2022-07-29 DIAGNOSIS — R630 Anorexia: Secondary | ICD-10-CM | POA: Diagnosis not present

## 2022-07-29 DIAGNOSIS — Z170 Estrogen receptor positive status [ER+]: Secondary | ICD-10-CM | POA: Diagnosis not present

## 2022-07-29 DIAGNOSIS — C7951 Secondary malignant neoplasm of bone: Secondary | ICD-10-CM | POA: Diagnosis not present

## 2022-07-29 DIAGNOSIS — E43 Unspecified severe protein-calorie malnutrition: Secondary | ICD-10-CM | POA: Diagnosis not present

## 2022-07-29 DIAGNOSIS — C787 Secondary malignant neoplasm of liver and intrahepatic bile duct: Secondary | ICD-10-CM | POA: Diagnosis not present

## 2022-08-05 DIAGNOSIS — C50412 Malignant neoplasm of upper-outer quadrant of left female breast: Secondary | ICD-10-CM | POA: Diagnosis not present

## 2022-08-05 DIAGNOSIS — C787 Secondary malignant neoplasm of liver and intrahepatic bile duct: Secondary | ICD-10-CM | POA: Diagnosis not present

## 2022-08-05 DIAGNOSIS — C7951 Secondary malignant neoplasm of bone: Secondary | ICD-10-CM | POA: Diagnosis not present

## 2022-08-05 DIAGNOSIS — R630 Anorexia: Secondary | ICD-10-CM | POA: Diagnosis not present

## 2022-08-05 DIAGNOSIS — Z170 Estrogen receptor positive status [ER+]: Secondary | ICD-10-CM | POA: Diagnosis not present

## 2022-08-05 DIAGNOSIS — E43 Unspecified severe protein-calorie malnutrition: Secondary | ICD-10-CM | POA: Diagnosis not present

## 2022-08-11 DIAGNOSIS — C50412 Malignant neoplasm of upper-outer quadrant of left female breast: Secondary | ICD-10-CM | POA: Diagnosis not present

## 2022-08-11 DIAGNOSIS — R630 Anorexia: Secondary | ICD-10-CM | POA: Diagnosis not present

## 2022-08-11 DIAGNOSIS — C787 Secondary malignant neoplasm of liver and intrahepatic bile duct: Secondary | ICD-10-CM | POA: Diagnosis not present

## 2022-08-11 DIAGNOSIS — E43 Unspecified severe protein-calorie malnutrition: Secondary | ICD-10-CM | POA: Diagnosis not present

## 2022-08-11 DIAGNOSIS — C7951 Secondary malignant neoplasm of bone: Secondary | ICD-10-CM | POA: Diagnosis not present

## 2022-08-11 DIAGNOSIS — Z170 Estrogen receptor positive status [ER+]: Secondary | ICD-10-CM | POA: Diagnosis not present

## 2022-08-13 DIAGNOSIS — R630 Anorexia: Secondary | ICD-10-CM | POA: Diagnosis not present

## 2022-08-13 DIAGNOSIS — Z170 Estrogen receptor positive status [ER+]: Secondary | ICD-10-CM | POA: Diagnosis not present

## 2022-08-13 DIAGNOSIS — C787 Secondary malignant neoplasm of liver and intrahepatic bile duct: Secondary | ICD-10-CM | POA: Diagnosis not present

## 2022-08-13 DIAGNOSIS — C7951 Secondary malignant neoplasm of bone: Secondary | ICD-10-CM | POA: Diagnosis not present

## 2022-08-13 DIAGNOSIS — C50412 Malignant neoplasm of upper-outer quadrant of left female breast: Secondary | ICD-10-CM | POA: Diagnosis not present

## 2022-08-13 DIAGNOSIS — E43 Unspecified severe protein-calorie malnutrition: Secondary | ICD-10-CM | POA: Diagnosis not present

## 2022-08-14 DIAGNOSIS — C7951 Secondary malignant neoplasm of bone: Secondary | ICD-10-CM | POA: Diagnosis not present

## 2022-08-14 DIAGNOSIS — E43 Unspecified severe protein-calorie malnutrition: Secondary | ICD-10-CM | POA: Diagnosis not present

## 2022-08-14 DIAGNOSIS — C787 Secondary malignant neoplasm of liver and intrahepatic bile duct: Secondary | ICD-10-CM | POA: Diagnosis not present

## 2022-08-14 DIAGNOSIS — R630 Anorexia: Secondary | ICD-10-CM | POA: Diagnosis not present

## 2022-08-14 DIAGNOSIS — C50412 Malignant neoplasm of upper-outer quadrant of left female breast: Secondary | ICD-10-CM | POA: Diagnosis not present

## 2022-08-14 DIAGNOSIS — Z170 Estrogen receptor positive status [ER+]: Secondary | ICD-10-CM | POA: Diagnosis not present

## 2022-08-15 DIAGNOSIS — Z170 Estrogen receptor positive status [ER+]: Secondary | ICD-10-CM | POA: Diagnosis not present

## 2022-08-15 DIAGNOSIS — E43 Unspecified severe protein-calorie malnutrition: Secondary | ICD-10-CM | POA: Diagnosis not present

## 2022-08-15 DIAGNOSIS — C7951 Secondary malignant neoplasm of bone: Secondary | ICD-10-CM | POA: Diagnosis not present

## 2022-08-15 DIAGNOSIS — C787 Secondary malignant neoplasm of liver and intrahepatic bile duct: Secondary | ICD-10-CM | POA: Diagnosis not present

## 2022-08-15 DIAGNOSIS — R630 Anorexia: Secondary | ICD-10-CM | POA: Diagnosis not present

## 2022-08-15 DIAGNOSIS — C50412 Malignant neoplasm of upper-outer quadrant of left female breast: Secondary | ICD-10-CM | POA: Diagnosis not present

## 2022-08-17 DIAGNOSIS — C7951 Secondary malignant neoplasm of bone: Secondary | ICD-10-CM | POA: Diagnosis not present

## 2022-08-17 DIAGNOSIS — C50412 Malignant neoplasm of upper-outer quadrant of left female breast: Secondary | ICD-10-CM | POA: Diagnosis not present

## 2022-08-17 DIAGNOSIS — R630 Anorexia: Secondary | ICD-10-CM | POA: Diagnosis not present

## 2022-08-17 DIAGNOSIS — C787 Secondary malignant neoplasm of liver and intrahepatic bile duct: Secondary | ICD-10-CM | POA: Diagnosis not present

## 2022-08-17 DIAGNOSIS — Z170 Estrogen receptor positive status [ER+]: Secondary | ICD-10-CM | POA: Diagnosis not present

## 2022-08-17 DIAGNOSIS — E43 Unspecified severe protein-calorie malnutrition: Secondary | ICD-10-CM | POA: Diagnosis not present

## 2022-08-18 DIAGNOSIS — C50412 Malignant neoplasm of upper-outer quadrant of left female breast: Secondary | ICD-10-CM | POA: Diagnosis not present

## 2022-08-18 DIAGNOSIS — R630 Anorexia: Secondary | ICD-10-CM | POA: Diagnosis not present

## 2022-08-18 DIAGNOSIS — C7951 Secondary malignant neoplasm of bone: Secondary | ICD-10-CM | POA: Diagnosis not present

## 2022-08-18 DIAGNOSIS — C787 Secondary malignant neoplasm of liver and intrahepatic bile duct: Secondary | ICD-10-CM | POA: Diagnosis not present

## 2022-08-18 DIAGNOSIS — E43 Unspecified severe protein-calorie malnutrition: Secondary | ICD-10-CM | POA: Diagnosis not present

## 2022-08-18 DIAGNOSIS — Z170 Estrogen receptor positive status [ER+]: Secondary | ICD-10-CM | POA: Diagnosis not present

## 2022-08-19 DIAGNOSIS — R630 Anorexia: Secondary | ICD-10-CM | POA: Diagnosis not present

## 2022-08-19 DIAGNOSIS — C787 Secondary malignant neoplasm of liver and intrahepatic bile duct: Secondary | ICD-10-CM | POA: Diagnosis not present

## 2022-08-19 DIAGNOSIS — Z170 Estrogen receptor positive status [ER+]: Secondary | ICD-10-CM | POA: Diagnosis not present

## 2022-08-19 DIAGNOSIS — C50412 Malignant neoplasm of upper-outer quadrant of left female breast: Secondary | ICD-10-CM | POA: Diagnosis not present

## 2022-08-19 DIAGNOSIS — C7951 Secondary malignant neoplasm of bone: Secondary | ICD-10-CM | POA: Diagnosis not present

## 2022-08-19 DIAGNOSIS — E43 Unspecified severe protein-calorie malnutrition: Secondary | ICD-10-CM | POA: Diagnosis not present

## 2022-08-20 DIAGNOSIS — C50412 Malignant neoplasm of upper-outer quadrant of left female breast: Secondary | ICD-10-CM | POA: Diagnosis not present

## 2022-08-20 DIAGNOSIS — C7951 Secondary malignant neoplasm of bone: Secondary | ICD-10-CM | POA: Diagnosis not present

## 2022-08-20 DIAGNOSIS — C787 Secondary malignant neoplasm of liver and intrahepatic bile duct: Secondary | ICD-10-CM | POA: Diagnosis not present

## 2022-08-20 DIAGNOSIS — Z170 Estrogen receptor positive status [ER+]: Secondary | ICD-10-CM | POA: Diagnosis not present

## 2022-08-20 DIAGNOSIS — R630 Anorexia: Secondary | ICD-10-CM | POA: Diagnosis not present

## 2022-08-20 DIAGNOSIS — E43 Unspecified severe protein-calorie malnutrition: Secondary | ICD-10-CM | POA: Diagnosis not present

## 2022-08-21 DIAGNOSIS — R630 Anorexia: Secondary | ICD-10-CM | POA: Diagnosis not present

## 2022-08-21 DIAGNOSIS — C7951 Secondary malignant neoplasm of bone: Secondary | ICD-10-CM | POA: Diagnosis not present

## 2022-08-21 DIAGNOSIS — C787 Secondary malignant neoplasm of liver and intrahepatic bile duct: Secondary | ICD-10-CM | POA: Diagnosis not present

## 2022-08-21 DIAGNOSIS — E43 Unspecified severe protein-calorie malnutrition: Secondary | ICD-10-CM | POA: Diagnosis not present

## 2022-08-21 DIAGNOSIS — C50412 Malignant neoplasm of upper-outer quadrant of left female breast: Secondary | ICD-10-CM | POA: Diagnosis not present

## 2022-08-21 DIAGNOSIS — Z170 Estrogen receptor positive status [ER+]: Secondary | ICD-10-CM | POA: Diagnosis not present

## 2022-08-22 DIAGNOSIS — R630 Anorexia: Secondary | ICD-10-CM | POA: Diagnosis not present

## 2022-08-22 DIAGNOSIS — C50412 Malignant neoplasm of upper-outer quadrant of left female breast: Secondary | ICD-10-CM | POA: Diagnosis not present

## 2022-08-22 DIAGNOSIS — C787 Secondary malignant neoplasm of liver and intrahepatic bile duct: Secondary | ICD-10-CM | POA: Diagnosis not present

## 2022-08-22 DIAGNOSIS — C7951 Secondary malignant neoplasm of bone: Secondary | ICD-10-CM | POA: Diagnosis not present

## 2022-08-22 DIAGNOSIS — Z170 Estrogen receptor positive status [ER+]: Secondary | ICD-10-CM | POA: Diagnosis not present

## 2022-08-22 DIAGNOSIS — E43 Unspecified severe protein-calorie malnutrition: Secondary | ICD-10-CM | POA: Diagnosis not present

## 2022-08-23 DIAGNOSIS — C50412 Malignant neoplasm of upper-outer quadrant of left female breast: Secondary | ICD-10-CM | POA: Diagnosis not present

## 2022-08-23 DIAGNOSIS — C787 Secondary malignant neoplasm of liver and intrahepatic bile duct: Secondary | ICD-10-CM | POA: Diagnosis not present

## 2022-08-23 DIAGNOSIS — C7951 Secondary malignant neoplasm of bone: Secondary | ICD-10-CM | POA: Diagnosis not present

## 2022-08-23 DIAGNOSIS — E43 Unspecified severe protein-calorie malnutrition: Secondary | ICD-10-CM | POA: Diagnosis not present

## 2022-08-23 DIAGNOSIS — Z170 Estrogen receptor positive status [ER+]: Secondary | ICD-10-CM | POA: Diagnosis not present

## 2022-08-23 DIAGNOSIS — R630 Anorexia: Secondary | ICD-10-CM | POA: Diagnosis not present

## 2022-08-25 DEATH — deceased

## 2023-04-15 NOTE — Telephone Encounter (Signed)
Telephone call
